# Patient Record
Sex: Female | Born: 1960 | Race: White | Hispanic: No | Marital: Married | Smoking: Former smoker
Health system: Southern US, Community
[De-identification: ages and names within clinical notes are randomized; demographics above are authoritative.]

## PROBLEM LIST (undated history)

## (undated) DIAGNOSIS — C801 Malignant (primary) neoplasm, unspecified: Secondary | ICD-10-CM

## (undated) DIAGNOSIS — F410 Panic disorder [episodic paroxysmal anxiety] without agoraphobia: Secondary | ICD-10-CM

## (undated) DIAGNOSIS — K219 Gastro-esophageal reflux disease without esophagitis: Secondary | ICD-10-CM

## (undated) DIAGNOSIS — R569 Unspecified convulsions: Secondary | ICD-10-CM

## (undated) DIAGNOSIS — F32A Depression, unspecified: Secondary | ICD-10-CM

## (undated) DIAGNOSIS — F329 Major depressive disorder, single episode, unspecified: Secondary | ICD-10-CM

## (undated) DIAGNOSIS — I82409 Acute embolism and thrombosis of unspecified deep veins of unspecified lower extremity: Secondary | ICD-10-CM

## (undated) HISTORY — PX: ABDOMINAL HYSTERECTOMY: SHX81

## (undated) HISTORY — DX: Major depressive disorder, single episode, unspecified: F32.9

## (undated) HISTORY — DX: Depression, unspecified: F32.A

## (undated) HISTORY — DX: Unspecified convulsions: R56.9

## (undated) HISTORY — DX: Gastro-esophageal reflux disease without esophagitis: K21.9

---

## 2002-02-14 ENCOUNTER — Other Ambulatory Visit: Admission: RE | Admit: 2002-02-14 | Discharge: 2002-02-14 | Payer: Self-pay | Admitting: Obstetrics and Gynecology

## 2002-02-21 ENCOUNTER — Ambulatory Visit (HOSPITAL_COMMUNITY): Admission: RE | Admit: 2002-02-21 | Discharge: 2002-02-21 | Payer: Self-pay | Admitting: Obstetrics and Gynecology

## 2002-02-21 ENCOUNTER — Encounter: Payer: Self-pay | Admitting: Obstetrics and Gynecology

## 2003-04-06 ENCOUNTER — Emergency Department (HOSPITAL_COMMUNITY): Admission: EM | Admit: 2003-04-06 | Discharge: 2003-04-07 | Payer: Self-pay | Admitting: Emergency Medicine

## 2003-06-30 ENCOUNTER — Other Ambulatory Visit: Payer: Self-pay

## 2003-12-10 ENCOUNTER — Other Ambulatory Visit: Payer: Self-pay

## 2004-02-07 ENCOUNTER — Emergency Department (HOSPITAL_COMMUNITY): Admission: EM | Admit: 2004-02-07 | Discharge: 2004-02-07 | Payer: Self-pay | Admitting: Emergency Medicine

## 2004-02-28 ENCOUNTER — Emergency Department: Payer: Self-pay | Admitting: Internal Medicine

## 2004-02-28 ENCOUNTER — Other Ambulatory Visit: Payer: Self-pay

## 2004-02-29 ENCOUNTER — Ambulatory Visit: Payer: Self-pay | Admitting: Internal Medicine

## 2004-08-15 ENCOUNTER — Emergency Department: Payer: Self-pay | Admitting: Emergency Medicine

## 2005-02-06 ENCOUNTER — Emergency Department: Payer: Self-pay | Admitting: Emergency Medicine

## 2005-04-10 ENCOUNTER — Ambulatory Visit (HOSPITAL_COMMUNITY): Payer: Self-pay | Admitting: Psychiatry

## 2005-05-03 ENCOUNTER — Ambulatory Visit (HOSPITAL_COMMUNITY): Payer: Self-pay | Admitting: Psychiatry

## 2005-05-29 ENCOUNTER — Ambulatory Visit (HOSPITAL_COMMUNITY): Payer: Self-pay | Admitting: Psychiatry

## 2005-07-24 ENCOUNTER — Ambulatory Visit (HOSPITAL_COMMUNITY): Payer: Self-pay | Admitting: Psychiatry

## 2005-10-06 ENCOUNTER — Ambulatory Visit (HOSPITAL_COMMUNITY): Payer: Self-pay | Admitting: Psychiatry

## 2005-12-04 ENCOUNTER — Ambulatory Visit (HOSPITAL_COMMUNITY): Payer: Self-pay | Admitting: Psychiatry

## 2006-02-05 ENCOUNTER — Ambulatory Visit (HOSPITAL_COMMUNITY): Payer: Self-pay | Admitting: Psychiatry

## 2006-03-09 ENCOUNTER — Ambulatory Visit: Payer: Self-pay | Admitting: Unknown Physician Specialty

## 2006-05-04 ENCOUNTER — Ambulatory Visit (HOSPITAL_COMMUNITY): Payer: Self-pay | Admitting: Psychiatry

## 2006-08-03 ENCOUNTER — Ambulatory Visit (HOSPITAL_COMMUNITY): Payer: Self-pay | Admitting: Psychiatry

## 2006-12-21 ENCOUNTER — Ambulatory Visit (HOSPITAL_COMMUNITY): Payer: Self-pay | Admitting: Psychiatry

## 2007-03-27 ENCOUNTER — Ambulatory Visit (HOSPITAL_COMMUNITY): Payer: Self-pay | Admitting: Psychiatry

## 2007-07-03 ENCOUNTER — Ambulatory Visit (HOSPITAL_COMMUNITY): Payer: Self-pay | Admitting: Psychiatry

## 2007-10-11 ENCOUNTER — Ambulatory Visit (HOSPITAL_COMMUNITY): Payer: Self-pay | Admitting: Psychiatry

## 2008-01-10 ENCOUNTER — Ambulatory Visit (HOSPITAL_COMMUNITY): Payer: Self-pay | Admitting: Psychiatry

## 2008-04-03 ENCOUNTER — Ambulatory Visit (HOSPITAL_COMMUNITY): Payer: Self-pay | Admitting: Psychiatry

## 2008-07-01 ENCOUNTER — Ambulatory Visit (HOSPITAL_COMMUNITY): Payer: Self-pay | Admitting: Psychiatry

## 2008-10-14 ENCOUNTER — Ambulatory Visit (HOSPITAL_COMMUNITY): Payer: Self-pay | Admitting: Psychiatry

## 2009-01-04 ENCOUNTER — Ambulatory Visit (HOSPITAL_COMMUNITY): Payer: Self-pay | Admitting: Psychiatry

## 2009-02-17 ENCOUNTER — Ambulatory Visit: Payer: Self-pay | Admitting: Unknown Physician Specialty

## 2009-03-31 ENCOUNTER — Ambulatory Visit (HOSPITAL_COMMUNITY): Payer: Self-pay | Admitting: Psychiatry

## 2009-06-28 ENCOUNTER — Ambulatory Visit (HOSPITAL_COMMUNITY): Payer: Self-pay | Admitting: Psychiatry

## 2009-08-06 ENCOUNTER — Emergency Department (HOSPITAL_COMMUNITY): Admission: EM | Admit: 2009-08-06 | Discharge: 2009-08-06 | Payer: Self-pay | Admitting: Emergency Medicine

## 2009-08-11 ENCOUNTER — Ambulatory Visit (HOSPITAL_COMMUNITY): Payer: Self-pay | Admitting: Psychiatry

## 2009-09-01 ENCOUNTER — Ambulatory Visit (HOSPITAL_COMMUNITY): Payer: Self-pay | Admitting: Psychiatry

## 2009-09-20 ENCOUNTER — Ambulatory Visit (HOSPITAL_COMMUNITY): Payer: Self-pay | Admitting: Psychiatry

## 2009-11-19 ENCOUNTER — Ambulatory Visit (HOSPITAL_COMMUNITY): Payer: Self-pay | Admitting: Psychiatry

## 2010-01-21 ENCOUNTER — Ambulatory Visit (HOSPITAL_COMMUNITY): Payer: Self-pay | Admitting: Psychiatry

## 2010-02-04 ENCOUNTER — Other Ambulatory Visit: Payer: Self-pay

## 2010-03-11 ENCOUNTER — Ambulatory Visit (HOSPITAL_COMMUNITY): Payer: Self-pay | Admitting: Psychiatry

## 2010-05-27 ENCOUNTER — Ambulatory Visit (HOSPITAL_COMMUNITY)
Admission: RE | Admit: 2010-05-27 | Discharge: 2010-05-27 | Payer: Self-pay | Source: Home / Self Care | Attending: Psychiatry | Admitting: Psychiatry

## 2010-08-19 ENCOUNTER — Encounter (HOSPITAL_COMMUNITY): Payer: Self-pay | Admitting: Psychiatry

## 2010-09-02 ENCOUNTER — Encounter (INDEPENDENT_AMBULATORY_CARE_PROVIDER_SITE_OTHER): Payer: PRIVATE HEALTH INSURANCE | Admitting: Psychiatry

## 2010-09-02 DIAGNOSIS — F411 Generalized anxiety disorder: Secondary | ICD-10-CM

## 2010-12-02 ENCOUNTER — Encounter (INDEPENDENT_AMBULATORY_CARE_PROVIDER_SITE_OTHER): Payer: PRIVATE HEALTH INSURANCE | Admitting: Psychiatry

## 2010-12-02 DIAGNOSIS — F411 Generalized anxiety disorder: Secondary | ICD-10-CM

## 2011-03-10 ENCOUNTER — Encounter (HOSPITAL_COMMUNITY): Payer: PRIVATE HEALTH INSURANCE | Admitting: Psychiatry

## 2011-03-20 ENCOUNTER — Encounter (INDEPENDENT_AMBULATORY_CARE_PROVIDER_SITE_OTHER): Payer: PRIVATE HEALTH INSURANCE | Admitting: Psychiatry

## 2011-03-20 DIAGNOSIS — F329 Major depressive disorder, single episode, unspecified: Secondary | ICD-10-CM

## 2011-04-21 DIAGNOSIS — N318 Other neuromuscular dysfunction of bladder: Secondary | ICD-10-CM | POA: Insufficient documentation

## 2011-06-16 ENCOUNTER — Ambulatory Visit (INDEPENDENT_AMBULATORY_CARE_PROVIDER_SITE_OTHER): Payer: PRIVATE HEALTH INSURANCE | Admitting: Psychiatry

## 2011-06-16 DIAGNOSIS — F329 Major depressive disorder, single episode, unspecified: Secondary | ICD-10-CM

## 2011-06-16 MED ORDER — PAROXETINE HCL 40 MG PO TABS: 40.0000 mg | ORAL_TABLET | ORAL | Status: DC

## 2011-06-16 NOTE — Progress Notes (Signed)
Patient came today for her followup appointment. She has been compliant with her Paxil. She had a good Christmas and holidays. She reported no side effects of medication. She denies any depressive thoughts or anxiety. Her job is also going very well. She denies any crying spells agitation or mood swings. She needs refill on her medication.  Mental status examination Patient is pleasant calm and cooperative. She maintained good eye contact. Her speech is soft clear and coherent. She described her mood is good and her affect is mood congruent. Her attention and concentration is fair. She denies any active or passive suicidal thoughts or homicidal thoughts. There no psychotic symptoms present. She's alert and oriented x3 and her insight judgment impulse control is okay  Assessment Depressive disorder NOS  Plan I will continue her Paxil 40 mg daily. I explained risks and benefits of medication in detail. I will see her again in 3 months

## 2011-09-15 ENCOUNTER — Ambulatory Visit (INDEPENDENT_AMBULATORY_CARE_PROVIDER_SITE_OTHER): Payer: PRIVATE HEALTH INSURANCE | Admitting: Psychiatry

## 2011-09-15 ENCOUNTER — Encounter (HOSPITAL_COMMUNITY): Payer: Self-pay | Admitting: Psychiatry

## 2011-09-15 VITALS — BP 110/68 | HR 68 | Ht 64.0 in | Wt 179.4 lb

## 2011-09-15 DIAGNOSIS — F329 Major depressive disorder, single episode, unspecified: Secondary | ICD-10-CM

## 2011-09-15 MED ORDER — PAROXETINE HCL 40 MG PO TABS: 40.0000 mg | ORAL_TABLET | ORAL | Status: DC

## 2011-09-15 NOTE — Progress Notes (Signed)
Chief complaint Medication management and followup.  History of present illness Patient is 51 year old married employed female who came for her followup appointment.  She's been compliant with her psychiatric medication and reported no side effects.  She sleeping fine and denies any recent panic attack or nervousness.  She denies any recent crying spells.  She's able to see her grandchildren and had a good time.  Her job is going very well and she likes her job.    Current psychiatric medication Paxil 40 mg daily   Medical history Patient has history of acid reflux .  Recently she has seen her primary care physician Dr. Yetta [facility].  She reported her blood work was within normal limits.    Mental status emanation Patient is casually dressed and fairly groomed.   She is calm cooperative and pleasant.  She maintained good eye contact.  Her speech is fluent clear and coherent.  Her thought process logical linear and goal-directed.  There were no flight of idea or looseness cessation.  Her attention and concentration is fair.  She hasn't auditory or visual hallucination.  She denies any active or passive suicidal thinking and homicidal thinking.  There were no psychotic symptoms present .  She's alert and oriented x3.  Her insight judgment and pulse control is okay.  Assessment Axis I depressive disorder NOS Axis II deferred Axis III acid reflux Axis IV mild to moderate Axis V 65-75  Plan At this time patient is fairly stable on her Paxil.  I will continue her Paxil 40 mg daily. I explained risks and benefits of medication in detail. I will see her again in 3 months.  I recommend to have her blood test results faxed to [place].

## 2011-12-15 ENCOUNTER — Ambulatory Visit (INDEPENDENT_AMBULATORY_CARE_PROVIDER_SITE_OTHER): Payer: PRIVATE HEALTH INSURANCE | Admitting: Psychiatry

## 2011-12-15 ENCOUNTER — Encounter (HOSPITAL_COMMUNITY): Payer: Self-pay | Admitting: Psychiatry

## 2011-12-15 DIAGNOSIS — F329 Major depressive disorder, single episode, unspecified: Secondary | ICD-10-CM

## 2011-12-15 DIAGNOSIS — F3289 Other specified depressive episodes: Secondary | ICD-10-CM

## 2011-12-15 MED ORDER — PAROXETINE HCL 40 MG PO TABS: 40.0000 mg | ORAL_TABLET | ORAL | Status: DC

## 2011-12-15 NOTE — Progress Notes (Signed)
Chief complaint Medication management and followup.  History of present illness Patient is 51 year old married employed female who came for her followup appointment.  She has been seen more anxious and depressed in recent weeks.  Her son and daughter-in-law moved back to [place] .  She is missing the grandkids.  She continues to have issue with her daughter who is not very supportive and helpful.  Patient has noticed since her grandkids moved to [place] she is more sad tearful and anxious however she denies any active or passive suicidal thoughts.  She does not want to change her medication or at any new medication.  I recommend counseling however patient told that she will weight for another month and if she continued to have anxiety symptoms she will consider to see someone in the office.  Patient denies any agitation anger mood swing.  She denies any side effects of medication.  She's happy that her job is going very well.  She's not drinking or using any illegal substance.  Current psychiatric medication Paxil 40 mg daily   Medical history Patient has history of acid reflux .  Recently she has seen her primary care physician Dr. Yet[facility].  She reported her blood work was within normal limits.    Mental status emanation Patient is casually dressed and fairly groomed.   She is anxious but cooperative.  She described her mood is sad and her affect is constricted.  She denies any active or passive suicidal thoughts or homicidal thoughts.  Her speech is slow but clear and coherent.  Her thought process is goal-directed.  She denies any auditory or visual hallucination.  There were no psychotic symptoms present at this time.  She's alert and oriented x3.  Her insight judgment and pulse control is okay.   Assessment Axis I depressive disorder NOS Axis II deferred Axis III acid reflux Axis IV mild to moderate Axis V 65-75  Plan I recommend counseling ,  medication adjustment however patient declined at this time.  Patient had good support at home and through her job.  I recommend to see me in 6 weeks and if her symptoms continue to persist then patient agreed to consider counseling.  I recommend to call us if she is any question or concern about the medication or if she feels worsening of the symptoms.  I will see her again in 6 weeks.  We will continue Paxil at present does.

## 2012-01-26 ENCOUNTER — Ambulatory Visit (HOSPITAL_COMMUNITY): Payer: Self-pay | Admitting: Psychiatry

## 2012-02-02 ENCOUNTER — Ambulatory Visit (INDEPENDENT_AMBULATORY_CARE_PROVIDER_SITE_OTHER): Payer: PRIVATE HEALTH INSURANCE | Admitting: Psychiatry

## 2012-02-02 ENCOUNTER — Encounter (HOSPITAL_COMMUNITY): Payer: Self-pay | Admitting: Psychiatry

## 2012-02-02 VITALS — Wt 181.0 lb

## 2012-02-02 DIAGNOSIS — F329 Major depressive disorder, single episode, unspecified: Secondary | ICD-10-CM

## 2012-02-02 MED ORDER — PAROXETINE HCL 40 MG PO TABS: 40.0000 mg | ORAL_TABLET | ORAL | Status: DC

## 2012-02-02 NOTE — Progress Notes (Signed)
Chief complaint I am doing better.    History of present illness Patient is 51-year-old married employed female who came for her followup appointment.  On her last visit she was very anxious and depressed as her son move to [place].  We have offer counseling but patient felt that she does not need counseling at this time.  Patient is trying to keep herself busy.  Recently she find out that his son who lives in [place] having a baby.  Patient is excited.  She wants to quit smoking .  She is trying to walk every day.  She denies any recent panic attack crying spells or any insomnia.  She wants to continue her current medication which is working very well.  She denies any agitation or any hopeless feeling.  Her energy level is good.  She's not drinking or using any illegal substance.   She [facility] for complete checkup and blood work.    Current psychiatric medication Paxil 40 mg daily   Medical history Patient has history of acid reflux .  Recently she has seen her primary care physician Dr. Yet[facility] at [facility].  She reported her blood work was within normal limits.    Psychosocial history Patient lives with her husband and her father.  Her father has been living since 2011.  Patient has 5 children.  She had 4 children that she raised.  She has given up one daughter for adoption.  Patient has limited contact with her other daughter.  She is very close to her son.  She has one son who lives in refill other in [place] and third one recently moved to [place].  Mental status emanation Patient is casually dressed and fairly groomed.   She is cooperative and maintained fair eye contact.  She described her mood is better from the past and her affect is improved.  Her speech is clear and coherent.  Her thought process logical linear and goal-directed.  She denies any auditory or visual hallucination.  She denies any active or passive  suicidal thoughts or homicidal thoughts.  There were no flight of ideas or loose association.  She's alert and oriented x3.  Her insight judgment and impulse control is okay.   Assessment Axis I depressive disorder NOS Axis II deferred Axis III acid reflux Axis IV mild to moderate Axis V 65-75  Plan I will continue her current psychiatric medication.  I recommend to have his blood test faxed to [place] when she see her primary care physician.   I explained risks and benefits of medication and recommend to call us if she is any question or concern about the medication if she feels worsening of the symptom.  I will see her again in 2 months.

## 2012-03-03 ENCOUNTER — Emergency Department (HOSPITAL_COMMUNITY): Payer: PRIVATE HEALTH INSURANCE

## 2012-03-03 ENCOUNTER — Encounter (HOSPITAL_COMMUNITY): Payer: Self-pay | Admitting: Adult Health

## 2012-03-03 ENCOUNTER — Emergency Department (HOSPITAL_COMMUNITY)
Admission: EM | Admit: 2012-03-03 | Discharge: 2012-03-03 | Disposition: A | Discharge status: Home / Self Care | Payer: PRIVATE HEALTH INSURANCE | Attending: Emergency Medicine | Admitting: Emergency Medicine

## 2012-03-03 DIAGNOSIS — Z90710 Acquired absence of both cervix and uterus: Secondary | ICD-10-CM | POA: Insufficient documentation

## 2012-03-03 DIAGNOSIS — R1011 Right upper quadrant pain: Secondary | ICD-10-CM | POA: Insufficient documentation

## 2012-03-03 DIAGNOSIS — R10819 Abdominal tenderness, unspecified site: Secondary | ICD-10-CM | POA: Insufficient documentation

## 2012-03-03 DIAGNOSIS — K8020 Calculus of gallbladder without cholecystitis without obstruction: Secondary | ICD-10-CM | POA: Insufficient documentation

## 2012-03-03 LAB — COMPREHENSIVE METABOLIC PANEL
BUN: 9 mg/dL (ref 6–23)
CO2: 25 mEq/L (ref 19–32)
Calcium: 9.6 mg/dL (ref 8.4–10.5)
Chloride: 103 mEq/L (ref 96–112)
Creatinine, Ser: 0.64 mg/dL (ref 0.50–1.10)
GFR calc Af Amer: >90 mL/min (ref >90)
GFR calc non Af Amer: >90 mL/min (ref >90)
Total Bilirubin: 0.2 mg/dL — ABNORMAL LOW (ref 0.3–1.2)

## 2012-03-03 LAB — LIPASE, BLOOD: Lipase: 35 U/L (ref 11–59)

## 2012-03-03 LAB — CBC WITH DIFFERENTIAL/PLATELET
Eosinophils Relative: 1 % (ref 0–5)
HCT: 38.4 % (ref 36.0–46.0)
Hemoglobin: 13 g/dL (ref 12.0–15.0)
Lymphocytes Relative: 25 % (ref 12–46)
MCHC: 33.9 g/dL (ref 30.0–36.0)
MCV: 88.9 fL (ref 78.0–100.0)
Monocytes Absolute: 0.4 10*3/uL (ref 0.1–1.0)
Monocytes Relative: 5 % (ref 3–12)
Neutro Abs: 5.5 10*3/uL (ref 1.7–7.7)
RDW: 12.9 % (ref 11.5–15.5)
WBC: 8.1 10*3/uL (ref 4.0–10.5)

## 2012-03-03 MED ORDER — FENTANYL CITRATE 0.05 MG/ML IJ SOLN
50.0000 ug | Freq: Once | INTRAMUSCULAR | Status: AC
  Administered 2012-03-03: 50 ug via INTRAVENOUS
  Filled 2012-03-03: qty 2

## 2012-03-03 MED ORDER — SODIUM CHLORIDE 0.9 % IV BOLUS (SEPSIS)
1000.0000 mL | Freq: Once | INTRAVENOUS | Status: AC
  Administered 2012-03-03: 1000 mL via INTRAVENOUS

## 2012-03-03 MED ORDER — OXYCODONE-ACETAMINOPHEN 5-325 MG PO TABS: 2.0000 | ORAL_TABLET | ORAL | Status: DC | PRN

## 2012-03-03 MED ORDER — ONDANSETRON HCL 4 MG/2ML IJ SOLN
4.0000 mg | Freq: Once | INTRAMUSCULAR | Status: AC
  Administered 2012-03-03: 4 mg via INTRAVENOUS
  Filled 2012-03-03: qty 2

## 2012-03-03 MED ORDER — ONDANSETRON HCL 4 MG PO TABS: 4.0000 mg | ORAL_TABLET | Freq: Four times a day (QID) | ORAL | Status: DC

## 2012-03-03 NOTE — ED Notes (Signed)
Reports upper abdominal pain that began at 1030 this evening associated with nausea. Pain is described as constant and "hurting very bad" attempted to take pepto with no relief. Denies SOB and chest pain.

## 2012-03-03 NOTE — ED Provider Notes (Addendum)
History     CSN: [id number]  Arrival date & time 03/03/12  0209   First MD Initiated Contact with Patient 03/03/12 0239      Chief Complaint  Patient presents with  . Abdominal Pain    (Consider location/radiation/quality/duration/timing/severity/associated sxs/prior treatment) HPI 51 year old female presents to emergency room complaining of upper abdominal pain starting around 10 PM tonight. Patient has had nausea associated with the pain. She has taken Pepto-Bismol x2 without improvement in symptoms. Patient has history of GERD, takes Nexium each day. Pain is much worse in her normal reflux symptoms. She denies any radiation into her back or shoulders. No chest pain, no shortness of breath. She has had normal bowel movements. Patient is status post hysterectomy, no complications since that time. No fevers no chills. No prior history of gallbladder disease. No sick contacts.  Past Medical History  Diagnosis Date  . GERD (gastroesophageal reflux disease)     Past Surgical History  Procedure Date  . Abdominal hysterectomy     Family History  Problem Relation Age of Onset  . Depression Sister     History  Substance Use Topics  . Smoking status: Current Every Day Smoker -- 0.5 packs/day for 30 years    Types: Cigarettes  . Smokeless tobacco: Not on file  . Alcohol Use: No    OB History    Grav Para Term Preterm Abortions TAB SAB Ect Mult Living                  Review of Systems  All other systems reviewed and are negative.    Allergies  Penicillins  Home Medications   Current Outpatient Rx  Name Route Sig Dispense Refill  . ESOMEPRAZOLE MAGNESIUM 40 MG PO CPDR Oral Take 40 mg by mouth daily before breakfast.    . PAROXETINE HCL 40 MG PO TABS Oral Take 1 tablet (40 mg total) by mouth every morning. 30 tablet 1    BP 132/82  Pulse 69  Temp 97.6 F (36.4 C) (Oral)  Resp 16  SpO2 100%  Physical Exam  Nursing note and vitals reviewed. Constitutional:  She is oriented to person, place, and time. She appears well-developed and well-nourished.  HENT:  Head: Normocephalic and atraumatic.  Nose: Nose normal.       Dry mucous membrane  Eyes: Conjunctivae normal and EOM are normal. Pupils are equal, round, and reactive to light.  Neck: Normal range of motion. Neck supple. No JVD present. No tracheal deviation present. No thyromegaly present.  Cardiovascular: Normal rate, regular rhythm, normal heart sounds and intact distal pulses.  Exam reveals no gallop and no friction rub.   No murmur heard. Pulmonary/Chest: Effort normal and breath sounds normal. No stridor. No respiratory distress. She has no wheezes. She has no rales. She exhibits no tenderness.  Abdominal: Soft. Bowel sounds are normal. She exhibits no distension and no mass. There is tenderness (Patient with significant tenderness in upper abdomen, mostly in epigastrium and right upper quadrant with positive Murphy's sign.). There is no rebound and no guarding.  Musculoskeletal: Normal range of motion. She exhibits no edema and no tenderness.  Lymphadenopathy:    She has no cervical adenopathy.  Neurological: She is alert and oriented to person, place, and time. She exhibits normal muscle tone. Coordination normal.  Skin: Skin is dry. No rash noted. No erythema. No pallor.  Psychiatric: She has a normal mood and affect. Her behavior is normal. Judgment and thought content normal.  ED Course  Procedures (including critical care time)  Labs Reviewed  COMPREHENSIVE METABOLIC PANEL - Abnormal; Notable for the following:    Glucose, Bld 123 (*)     Total Bilirubin 0.2 (*)     All other components within normal limits  CBC WITH DIFFERENTIAL  LIPASE, BLOOD   US Abdomen Complete  03/03/2012  *RADIOLOGY REPORT*  Clinical Data:  Right upper quadrant pain  COMPLETE ABDOMINAL ULTRASOUND  Comparison:  None.  Findings:  Gallbladder:  2.6 cm gallstone.  Additional 5 mm probable gallstone.  No  gallbladder wall thickening or pericholecystic fluid.  Negative sonographic Murphy's sign.  Common bile duct:  Measures 4 mm.  Liver:  No focal lesion identified.  Within normal limits in parenchymal echogenicity.  IVC:  Appears normal.  Pancreas:  Incompletely visualized but grossly unremarkable.  Spleen:  Measures 4.3 cm.  Right Kidney:  Measures 13.0 cm.  No mass or hydronephrosis.  Left Kidney:  Measures 12.2 cm.  No mass or hydronephrosis.  Abdominal aorta:  No aneurysm identified.  IMPRESSION: Cholelithiasis, without associated findings to suggest acute cholecystitis.   Original Report Authenticated By: Charline Bills, M.D.     Date: 03/03/2012  Rate: 67  Rhythm: normal sinus rhythm  QRS Axis: normal  Intervals: normal  ST/T Wave abnormalities: normal  Conduction Disutrbances:none  Narrative Interpretation:   Old EKG Reviewed: unchanged    1. Cholelithiasis       MDM  51 year old female with acute upper abdominal pain. Concern for possible cholelithiasis. Will treat pain, check labs, get ultrasound. Differential also includes pancreatitis, peptic ulcer disease, or gastritis.  5:48 AM D/w Dr Janee Morn on call for surgery.  He agrees that since pt is pain free without lab abn or signs of cholecystitis, is stable for f/u outpatient clinic.        Olivia Mackie, MD 03/03/12 [id number]  Olivia Mackie, MD 03/03/12 [phone]

## 2012-03-25 DIAGNOSIS — E785 Hyperlipidemia, unspecified: Secondary | ICD-10-CM | POA: Insufficient documentation

## 2012-03-27 DIAGNOSIS — K8020 Calculus of gallbladder without cholecystitis without obstruction: Secondary | ICD-10-CM | POA: Insufficient documentation

## 2012-04-05 ENCOUNTER — Ambulatory Visit (INDEPENDENT_AMBULATORY_CARE_PROVIDER_SITE_OTHER): Payer: PRIVATE HEALTH INSURANCE | Admitting: Psychiatry

## 2012-04-05 ENCOUNTER — Encounter (HOSPITAL_COMMUNITY): Payer: Self-pay | Admitting: Psychiatry

## 2012-04-05 VITALS — BP 105/63 | HR 63 | Wt 172.0 lb

## 2012-04-05 DIAGNOSIS — F3289 Other specified depressive episodes: Secondary | ICD-10-CM

## 2012-04-05 DIAGNOSIS — K2270 Barrett's esophagus without dysplasia: Secondary | ICD-10-CM | POA: Insufficient documentation

## 2012-04-05 DIAGNOSIS — F329 Major depressive disorder, single episode, unspecified: Secondary | ICD-10-CM

## 2012-04-05 DIAGNOSIS — K8020 Calculus of gallbladder without cholecystitis without obstruction: Secondary | ICD-10-CM

## 2012-04-05 MED ORDER — PAROXETINE HCL 40 MG PO TABS: 40.0000 mg | ORAL_TABLET | ORAL | Status: DC
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
## 2012-05-07 ENCOUNTER — Ambulatory Visit: Payer: Self-pay | Admitting: Unknown Physician Specialty

## 2012-07-05 ENCOUNTER — Ambulatory Visit (HOSPITAL_COMMUNITY): Payer: Self-pay | Admitting: Psychiatry

## 2012-07-22 ENCOUNTER — Other Ambulatory Visit (HOSPITAL_COMMUNITY): Payer: Self-pay | Admitting: Psychiatry

## 2012-07-22 DIAGNOSIS — F329 Major depressive disorder, single episode, unspecified: Secondary | ICD-10-CM

## 2012-07-22 MED ORDER — PAROXETINE HCL 40 MG PO TABS: 40.0000 mg | ORAL_TABLET | ORAL | Status: DC

## 2012-07-26 ENCOUNTER — Ambulatory Visit (HOSPITAL_COMMUNITY): Payer: Self-pay | Admitting: Psychiatry

## 2012-08-16 ENCOUNTER — Encounter (HOSPITAL_COMMUNITY): Payer: Self-pay | Admitting: Psychiatry

## 2012-08-16 ENCOUNTER — Ambulatory Visit (INDEPENDENT_AMBULATORY_CARE_PROVIDER_SITE_OTHER): Payer: PRIVATE HEALTH INSURANCE | Admitting: Psychiatry

## 2012-08-16 VITALS — BP 116/73 | HR 77 | Wt 172.0 lb

## 2012-08-16 DIAGNOSIS — F3289 Other specified depressive episodes: Secondary | ICD-10-CM

## 2012-08-16 DIAGNOSIS — F329 Major depressive disorder, single episode, unspecified: Secondary | ICD-10-CM

## 2012-08-16 MED ORDER — PAROXETINE HCL 40 MG PO TABS: 40.0000 mg | ORAL_TABLET | ORAL | Status: DC
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
## 2012-11-15 ENCOUNTER — Ambulatory Visit (INDEPENDENT_AMBULATORY_CARE_PROVIDER_SITE_OTHER): Payer: 59 | Admitting: Psychiatry

## 2012-11-15 ENCOUNTER — Encounter (HOSPITAL_COMMUNITY): Payer: Self-pay | Admitting: Psychiatry

## 2012-11-15 VITALS — Wt 176.0 lb

## 2012-11-15 DIAGNOSIS — F3289 Other specified depressive episodes: Secondary | ICD-10-CM

## 2012-11-15 DIAGNOSIS — F329 Major depressive disorder, single episode, unspecified: Secondary | ICD-10-CM

## 2012-11-15 MED ORDER — PAROXETINE HCL 40 MG PO TABS: 40.0000 mg | ORAL_TABLET | ORAL | Status: DC

## 2012-11-15 NOTE — Progress Notes (Signed)
[facility] [phone] Progress Note  Melissa Case [id number] 52 y.o.  11/15/2012 11:40 AM  Chief Complaint: Medication management and followup.  History of Present Illness: Patient is 52 year old married employed female who came for her followup appointment.  She's compliant with her psychiatric medication.  She reported no side effects.  She admitted some time feeling stress for no reason.  She sleeping better.  She is busy at work.  There has been no issues from her family .  She's excited about upcoming visit for fishing with her husband.  Patient denies any recent crying spells, irritability, agitation or any mood swing.  Her energy level is good.  She sleeping better.  She's not drinking or using any illegal substance.  She has no tremors or shakes.    Suicidal Ideation: No Plan Formed: No Patient has means to carry out plan: No  Homicidal Ideation: No Plan Formed: No Patient has means to carry out plan: No  Review of Systems: Psychiatric: Agitation: No Hallucination: No Depressed Mood: No Insomnia: No Hypersomnia: No Altered Concentration: No Feels Worthless: No Grandiose Ideas: No Belief In Special Powers: No New/Increased Substance Abuse: No Compulsions: No  Neurologic: Headache: No Seizure: No Paresthesias: No  Medical History: Patient has a history of acid reflux and barrett esophagus .  She also diagnosed with cholelithiasis .  She was recommended to have gallbladder surgery but she still waiting.  She sees [name] for her physical needs.    Psychosocial history. Patient lives with her husband and her father.  She has 5 children.  She has given up one daughter for adoption.  She has limited contact with her daughter.  She is very close to her son however most of her children are out of town.  She works and like her job.  Alcohol and substance use history.  Patient denies any history of alcohol or any illegal substance use.  Outpatient Encounter  Prescriptions as of 11/15/2012  Medication Sig Dispense Refill  . esomeprazole (NEXIUM) 40 MG capsule Take 40 mg by mouth daily before breakfast.      . PARoxetine (PAXIL) 40 MG tablet Take 1 tablet (40 mg total) by mouth every morning.  30 tablet  2  . [DISCONTINUED] PARoxetine (PAXIL) 40 MG tablet Take 1 tablet (40 mg total) by mouth every morning.  30 tablet  2  . clindamycin (CLEOCIN) 150 MG capsule       . [DISCONTINUED] ondansetron (ZOFRAN) 4 MG tablet Take 1 tablet (4 mg total) by mouth every 6 (six) hours. As needed for nausea  12 tablet  0  . [DISCONTINUED] oxyCODONE-acetaminophen (PERCOCET/ROXICET) 5-325 MG per tablet Take 2 tablets by mouth every 4 (four) hours as needed for pain.  20 tablet  0   No facility-administered encounter medications on file as of 11/15/2012.    Past Psychiatric History/Hospitalization(s): Anxiety: Yes Bipolar Disorder: No Depression: Yes Mania: No Psychosis: No Schizophrenia: No Personality Disorder: No Hospitalization for psychiatric illness: Yes History of Electroconvulsive Shock Therapy: No Prior Suicide Attempts: No  Physical Exam: Constitutional:  Wt 176 lb (79.833 kg)  BMI 30.2 kg/m2  General Appearance: alert, oriented, no acute distress and well nourished  Musculoskeletal: Strength & Muscle Tone: within normal limits Gait & Station: normal Patient leans: N/A  Psychiatric: Speech (describe rate, volume, coherence, spontaneity, and abnormalities if any): Clear and coherent  Thought Process (describe rate, content, abstract reasoning, and computation): Logical and goal-directed  Associations: Coherent and Intact  Thoughts: normal  Mental Status: Orientation: oriented to person, place, time/date and situation Mood & Affect: anxiety Attention Span & Concentration: Fair  Medical Decision Making (Choose Three): Established Problem, Stable/Improving (1), Review of Psycho-Social Stressors (1), Review of Last Therapy Session (1) and  Review of Medication Regimen & Side Effects (2)  Assessment: Axis I: Depressive disorder NOS  Axis II: Deferred  Axis III:  Patient Active Problem List   Diagnosis Date Noted  . 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 Gall stone 04/05/2012  . Depression 09/15/2011   Axis IV: Mild  Axis V: 55-75   Plan:  I will continue Paxil 40 mg daily.  This can benefit explain.  Recommend to call us back if she is any question concerns or should be worsening of the symptom.  I will see her again in 3 months.  Logyn Dedominicis T., MD 11/15/2012

## 2013-02-14 ENCOUNTER — Ambulatory Visit (HOSPITAL_COMMUNITY): Payer: 59 | Admitting: Psychiatry

## 2013-02-19 ENCOUNTER — Ambulatory Visit (HOSPITAL_COMMUNITY): Payer: Self-pay | Admitting: Psychiatry

## 2013-03-14 ENCOUNTER — Encounter (HOSPITAL_COMMUNITY): Payer: Self-pay | Admitting: Psychiatry

## 2013-03-14 ENCOUNTER — Ambulatory Visit (INDEPENDENT_AMBULATORY_CARE_PROVIDER_SITE_OTHER): Payer: 59 | Admitting: Psychiatry

## 2013-03-14 VITALS — BP 102/64 | HR 65 | Wt 178.0 lb

## 2013-03-14 DIAGNOSIS — F329 Major depressive disorder, single episode, unspecified: Secondary | ICD-10-CM

## 2013-03-14 DIAGNOSIS — Z79899 Other long term (current) drug therapy: Secondary | ICD-10-CM

## 2013-03-14 MED ORDER — PAROXETINE HCL 40 MG PO TABS: 40.0000 mg | ORAL_TABLET | ORAL | Status: DC

## 2013-03-14 NOTE — Progress Notes (Signed)
[facility] [id number] Progress Note  Melissa Case [id number] 52 y.o.  03/14/2013 9:19 AM  Chief Complaint: Medication management and followup.  History of Present Illness: Patient came for her followup appointment.  She's compliant with her Paxil and Nexium.  She reported no side effects.  Her daughter has a baby in August when he was in [place] and she is very excited about her.  Patient has no plan at this time to visit her because she is very busy at work.  She is concerned about her dog who was diagnosed with cancer and getting chemotherapy.  Overall patient is doing better.  She denies any insomnia irritability anger and mood swings.  She denies any crying spells.  She has no tremors or shakes.  Her energy level is good.  She has not seen her primary care physician in a while.  She has no blood work since last year.  Patient is not drinking or using any illegal substance.  Suicidal Ideation: No Plan Formed: No Patient has means to carry out plan: No  Homicidal Ideation: No Plan Formed: No Patient has means to carry out plan: No  Review of Systems: Psychiatric: Agitation: No Hallucination: No Depressed Mood: No Insomnia: No Hypersomnia: No Altered Concentration: No Feels Worthless: No Grandiose Ideas: No Belief In Special Powers: No New/Increased Substance Abuse: No Compulsions: No  Neurologic: Headache: No Seizure: No Paresthesias: No  Medical History:  Patient has a history of acid reflux and barrett esophagus .  Her primary care physician is [name] and she see Dr. Markham Jordan at [facility] at [facility] for her GI issues.    Psychosocial history. Patient lives with her husband and her father.  She has 5 children.  She has given up one daughter for adoption.  She has limited contact with her daughter.  She is very close to her son however most of her children are out of town.  She works and like her job.  Alcohol and substance use history.   Patient denies any history of alcohol or any illegal substance use.  Outpatient Encounter Prescriptions as of 03/14/2013  Medication Sig Dispense Refill  . esomeprazole (NEXIUM) 40 MG capsule Take 40 mg by mouth daily before breakfast.      . PARoxetine (PAXIL) 40 MG tablet Take 1 tablet (40 mg total) by mouth every morning.  30 tablet  2  . [DISCONTINUED] PARoxetine (PAXIL) 40 MG tablet Take 1 tablet (40 mg total) by mouth every morning.  30 tablet  2  . [DISCONTINUED] clindamycin (CLEOCIN) 150 MG capsule        No facility-administered encounter medications on file as of 03/14/2013.    Past Psychiatric History/Hospitalization(s): Anxiety: Yes Bipolar Disorder: No Depression: Yes Mania: No Psychosis: No Schizophrenia: No Personality Disorder: No Hospitalization for psychiatric illness: Yes History of Electroconvulsive Shock Therapy: No Prior Suicide Attempts: No  Physical Exam: Constitutional:  BP 102/64  Pulse 65  Wt 178 lb (80.74 kg)  BMI 30.54 kg/m2  General Appearance: alert, oriented, no acute distress and well nourished  Musculoskeletal: Strength & Muscle Tone: within normal limits Gait & Station: normal Patient leans: N/A  Psychiatric: Speech (describe rate, volume, coherence, spontaneity, and abnormalities if any): Clear and coherent  Thought Process (describe rate, content, abstract reasoning, and computation): Logical and goal-directed  Associations: Coherent and Intact  Thoughts: normal  Mental Status: Orientation: oriented to person, place, time/date and situation Mood & Affect: anxiety Attention Span & Concentration: Fair  Medical  Decision Making (Choose Three): Established Problem, Stable/Improving (1), Review of Psycho-Social Stressors (1), Review of Last Therapy Session (1) and Review of Medication Regimen & Side Effects (2)  Assessment: Axis I: Depressive disorder NOS  Axis II: Deferred  Axis III:  Patient Active Problem List    Diagnosis Date Noted  . 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 Gall stone 04/05/2012  . Depression 09/15/2011   Axis IV: Mild  Axis V: 55-75   Plan:  I will continue Paxil 40 mg daily.  I will order CBC, CMP, TSH, lipid panel and hemoglobin A1c since patient has not had any blood work in more than a year.  I explained risks and benefits of medication. Recommend to call us back if she is any question concerns or should be worsening of the symptom.  I will see her again in 3 months.   Bud Kaeser T., MD 03/14/2013

## 2013-05-19 ENCOUNTER — Other Ambulatory Visit (HOSPITAL_COMMUNITY): Payer: Self-pay | Admitting: *Deleted

## 2013-05-19 DIAGNOSIS — F329 Major depressive disorder, single episode, unspecified: Secondary | ICD-10-CM

## 2013-05-20 MED ORDER — PAROXETINE HCL 40 MG PO TABS: 40.0000 mg | ORAL_TABLET | ORAL | Status: DC

## 2013-05-20 NOTE — Telephone Encounter (Signed)
90 day refill authorized by Dr. Lolly Mustache

## 2013-06-13 ENCOUNTER — Ambulatory Visit (INDEPENDENT_AMBULATORY_CARE_PROVIDER_SITE_OTHER): Payer: 59 | Admitting: Psychiatry

## 2013-06-13 ENCOUNTER — Encounter (HOSPITAL_COMMUNITY): Payer: Self-pay | Admitting: Psychiatry

## 2013-06-13 DIAGNOSIS — F3289 Other specified depressive episodes: Secondary | ICD-10-CM

## 2013-06-13 DIAGNOSIS — F329 Major depressive disorder, single episode, unspecified: Secondary | ICD-10-CM

## 2013-06-13 NOTE — Progress Notes (Signed)
[facility] [phone] Progress Note  Melissa Case [id number] 53 y.o.  06/13/2013 9:17 AM  Chief Complaint: Medication management and followup.  History of Present Illness: Melissa Case came for her followup appointment .  She is compliant with her Paxil .  She had a very good Christmas.  She enjoyed family members.  She is sleeping good.  She denies any recent crying spells, depressive thoughts or any anxiety attacks.  She likes her work.  Sometimes she gets concerned because her he has been recently bought by corporate .  Patient denies any irritability or any anger.  She wants to continue her Paxil.  She has no side effects.  She is not drinking or using any illegal substances.  Patient forgot to have blood work or promise to have blood work before her next visit.    Suicidal Ideation: No Plan Formed: No Patient has means to carry out plan: No  Homicidal Ideation: No Plan Formed: No Patient has means to carry out plan: No  Review of Systems: Psychiatric: Agitation: No Hallucination: No Depressed Mood: No Insomnia: No Hypersomnia: No Altered Concentration: No Feels Worthless: No Grandiose Ideas: No Belief In Special Powers: No New/Increased Substance Abuse: No Compulsions: No  Neurologic: Headache: No Seizure: No Paresthesias: No  Medical History:  Patient has a history of acid reflux and barrett esophagus .  Her primary care physician is Eldridge Abrahams and she see Dr. Tiffany Kocher at [facility] at [facility] for her GI issues.    Psychosocial history. Patient lives with her husband and her father.  She has 5 children.  She has given up one daughter for adoption.  She has limited contact with her daughter.  She is very close to her son however most of her children are out of town.  She works and like her job.   Outpatient Encounter Prescriptions as of 06/13/2013  Medication Sig  . esomeprazole (NEXIUM) 40 MG capsule Take 40 mg by mouth daily before  breakfast.  . PARoxetine (PAXIL) 40 MG tablet Take 1 tablet (40 mg total) by mouth every morning.    Past Psychiatric History/Hospitalization(s): Anxiety: Yes Bipolar Disorder: No Depression: Yes Mania: No Psychosis: No Schizophrenia: No Personality Disorder: No Hospitalization for psychiatric illness: Yes History of Electroconvulsive Shock Therapy: No Prior Suicide Attempts: No  Physical Exam: Constitutional:  There were no vitals taken for this visit.  General Appearance: alert, oriented, no acute distress and well nourished  Musculoskeletal: Strength & Muscle Tone: within normal limits Gait & Station: normal Patient leans: N/A  Psychiatric: Speech (describe rate, volume, coherence, spontaneity, and abnormalities if any): Clear and coherent  Thought Process (describe rate, content, abstract reasoning, and computation): Logical and goal-directed  Associations: Coherent and Intact  Thoughts: normal  Mental Status: Orientation: oriented to person, place, time/date and situation Mood & Affect: anxiety Attention Span & Concentration: Fair  Medical Decision Making (Choose Three): Established Problem, Stable/Improving (1), Review of Psycho-Social Stressors (1), Review of Last Therapy Session (1) and Review of Medication Regimen & Side Effects (2)  Assessment: Axis I: Depressive disorder NOS  Axis II: Deferred  Axis III:  Patient Active Problem List   Diagnosis Date Noted  . 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 Gall stone 04/05/2012  . Depression 09/15/2011   Axis IV: Mild  Axis V: 55-75   Plan:  I will continue Paxil 40 mg daily.  Patient recently received a 90 day supply of her Paxil.  I encouraged her to have her blood work  done before our next visit.  Discussed risks and benefits of medication.  Followup in 4 months.  ARFEEN,SYED T., MD 06/13/2013

## 2013-08-07 ENCOUNTER — Other Ambulatory Visit (HOSPITAL_COMMUNITY): Payer: Self-pay | Admitting: Psychiatry

## 2013-08-07 DIAGNOSIS — F329 Major depressive disorder, single episode, unspecified: Secondary | ICD-10-CM

## 2013-08-07 DIAGNOSIS — F3289 Other specified depressive episodes: Secondary | ICD-10-CM

## 2013-08-07 NOTE — Telephone Encounter (Signed)
Chart reviewed, refill appropriate. Has appointment May 2015.

## 2013-09-23 ENCOUNTER — Other Ambulatory Visit (HOSPITAL_COMMUNITY): Payer: Self-pay | Admitting: Psychiatry

## 2013-09-30 ENCOUNTER — Inpatient Hospital Stay (HOSPITAL_COMMUNITY)
Admission: EM | Admit: 2013-09-30 | Discharge: 2013-10-02 | DRG: 390 | Disposition: A | Discharge status: Home / Self Care | Payer: Managed Care, Other (non HMO) | Attending: Internal Medicine | Admitting: Internal Medicine

## 2013-09-30 ENCOUNTER — Encounter (HOSPITAL_COMMUNITY): Payer: Self-pay | Admitting: Emergency Medicine

## 2013-09-30 ENCOUNTER — Emergency Department (HOSPITAL_COMMUNITY): Payer: Managed Care, Other (non HMO)

## 2013-09-30 DIAGNOSIS — F17200 Nicotine dependence, unspecified, uncomplicated: Secondary | ICD-10-CM | POA: Diagnosis present

## 2013-09-30 DIAGNOSIS — R109 Unspecified abdominal pain: Secondary | ICD-10-CM | POA: Diagnosis present

## 2013-09-30 DIAGNOSIS — K2270 Barrett's esophagus without dysplasia: Secondary | ICD-10-CM | POA: Diagnosis present

## 2013-09-30 DIAGNOSIS — K8020 Calculus of gallbladder without cholecystitis without obstruction: Secondary | ICD-10-CM

## 2013-09-30 DIAGNOSIS — K219 Gastro-esophageal reflux disease without esophagitis: Secondary | ICD-10-CM | POA: Diagnosis present

## 2013-09-30 DIAGNOSIS — F3289 Other specified depressive episodes: Secondary | ICD-10-CM | POA: Diagnosis present

## 2013-09-30 DIAGNOSIS — Z79899 Other long term (current) drug therapy: Secondary | ICD-10-CM

## 2013-09-30 DIAGNOSIS — F32A Depression, unspecified: Secondary | ICD-10-CM | POA: Diagnosis present

## 2013-09-30 DIAGNOSIS — K56609 Unspecified intestinal obstruction, unspecified as to partial versus complete obstruction: Principal | ICD-10-CM | POA: Diagnosis present

## 2013-09-30 DIAGNOSIS — F329 Major depressive disorder, single episode, unspecified: Secondary | ICD-10-CM

## 2013-09-30 DIAGNOSIS — Z880 Allergy status to penicillin: Secondary | ICD-10-CM

## 2013-09-30 LAB — CBC WITH DIFFERENTIAL/PLATELET
BASOS PCT: 0 % (ref 0–1)
Basophils Absolute: 0 10*3/uL (ref 0.0–0.1)
EOS ABS: 0.1 10*3/uL (ref 0.0–0.7)
Eosinophils Relative: 1 % (ref 0–5)
HEMATOCRIT: 37.6 % (ref 36.0–46.0)
HEMOGLOBIN: 12.9 g/dL (ref 12.0–15.0)
LYMPHS ABS: 3 10*3/uL (ref 0.7–4.0)
Lymphocytes Relative: 47 % — ABNORMAL HIGH (ref 12–46)
MCH: 29.9 pg (ref 26.0–34.0)
MCHC: 34.3 g/dL (ref 30.0–36.0)
MCV: 87.2 fL (ref 78.0–100.0)
MONO ABS: 0.5 10*3/uL (ref 0.1–1.0)
MONOS PCT: 9 % (ref 3–12)
Neutro Abs: 2.7 10*3/uL (ref 1.7–7.7)
Neutrophils Relative %: 43 % (ref 43–77)
Platelets: 216 10*3/uL (ref 150–400)
RBC: 4.31 MIL/uL (ref 3.87–5.11)
RDW: 13.5 % (ref 11.5–15.5)
WBC: 6.3 10*3/uL (ref 4.0–10.5)

## 2013-09-30 LAB — COMPREHENSIVE METABOLIC PANEL
ALBUMIN: 3.9 g/dL (ref 3.5–5.2)
ALT: 18 U/L (ref 0–35)
AST: 18 U/L (ref 0–37)
Alkaline Phosphatase: 85 U/L (ref 39–117)
BILIRUBIN TOTAL: 0.3 mg/dL (ref 0.3–1.2)
BUN: 14 mg/dL (ref 6–23)
CHLORIDE: 102 meq/L (ref 96–112)
CO2: 23 mEq/L (ref 19–32)
CREATININE: 0.55 mg/dL (ref 0.50–1.10)
Calcium: 9.4 mg/dL (ref 8.4–10.5)
GFR calc non Af Amer: >90 mL/min (ref >90)
GLUCOSE: 104 mg/dL — AB (ref 70–99)
Potassium: 3.9 mEq/L (ref 3.7–5.3)
Sodium: 140 mEq/L (ref 137–147)
TOTAL PROTEIN: 7.4 g/dL (ref 6.0–8.3)

## 2013-09-30 LAB — LIPASE, BLOOD: LIPASE: 34 U/L (ref 11–59)

## 2013-09-30 LAB — URINALYSIS, ROUTINE W REFLEX MICROSCOPIC
Bilirubin Urine: NEGATIVE
GLUCOSE, UA: NEGATIVE mg/dL
Hgb urine dipstick: NEGATIVE
KETONES UR: NEGATIVE mg/dL
LEUKOCYTES UA: NEGATIVE
NITRITE: NEGATIVE
PROTEIN: NEGATIVE mg/dL
Specific Gravity, Urine: 1.02 (ref 1.005–1.030)
Urobilinogen, UA: 0.2 mg/dL (ref 0.0–1.0)
pH: 7 (ref 5.0–8.0)

## 2013-09-30 MED ORDER — ONDANSETRON HCL 4 MG/2ML IJ SOLN: 4.0000 mg | Freq: Once | INTRAMUSCULAR | Status: DC

## 2013-09-30 MED ORDER — ONDANSETRON HCL 4 MG PO TABS: 4.0000 mg | ORAL_TABLET | Freq: Four times a day (QID) | ORAL | Status: DC | PRN

## 2013-09-30 MED ORDER — ONDANSETRON HCL 4 MG/2ML IJ SOLN
4.0000 mg | Freq: Three times a day (TID) | INTRAMUSCULAR | Status: AC | PRN
  Filled 2013-09-30: qty 2

## 2013-09-30 MED ORDER — ONDANSETRON HCL 4 MG/2ML IJ SOLN
4.0000 mg | Freq: Once | INTRAMUSCULAR | Status: AC
  Administered 2013-09-30: 4 mg via INTRAVENOUS
  Filled 2013-09-30: qty 2

## 2013-09-30 MED ORDER — ACETAMINOPHEN 325 MG PO TABS: 650.0000 mg | ORAL_TABLET | Freq: Four times a day (QID) | ORAL | Status: DC | PRN

## 2013-09-30 MED ORDER — HYDROMORPHONE HCL PF 1 MG/ML IJ SOLN
1.0000 mg | INTRAMUSCULAR | Status: DC | PRN
  Administered 2013-09-30: 1 mg via INTRAVENOUS
  Filled 2013-09-30: qty 1

## 2013-09-30 MED ORDER — PANTOPRAZOLE SODIUM 40 MG IV SOLR
40.0000 mg | INTRAVENOUS | Status: DC
  Administered 2013-10-01 (×2): 40 mg via INTRAVENOUS
  Filled 2013-09-30 (×3): qty 40

## 2013-09-30 MED ORDER — ONDANSETRON HCL 4 MG/2ML IJ SOLN
4.0000 mg | Freq: Four times a day (QID) | INTRAMUSCULAR | Status: DC | PRN
  Administered 2013-10-01: 4 mg via INTRAVENOUS

## 2013-09-30 MED ORDER — HYDROMORPHONE HCL PF 1 MG/ML IJ SOLN
1.0000 mg | Freq: Once | INTRAMUSCULAR | Status: AC
  Administered 2013-09-30: 1 mg via INTRAVENOUS
  Filled 2013-09-30: qty 1

## 2013-09-30 MED ORDER — CALCIUM CARBONATE ANTACID 500 MG PO CHEW: 2.0000 | CHEWABLE_TABLET | ORAL | Status: DC | PRN

## 2013-09-30 MED ORDER — PAROXETINE HCL 20 MG PO TABS
40.0000 mg | ORAL_TABLET | Freq: Every day | ORAL | Status: DC
  Administered 2013-10-01 – 2013-10-02 (×2): 40 mg via ORAL
  Filled 2013-09-30 (×2): qty 2

## 2013-09-30 MED ORDER — IOHEXOL 300 MG/ML  SOLN
50.0000 mL | Freq: Once | INTRAMUSCULAR | Status: AC | PRN
  Administered 2013-09-30: 50 mL via ORAL

## 2013-09-30 MED ORDER — IOHEXOL 300 MG/ML  SOLN
100.0000 mL | Freq: Once | INTRAMUSCULAR | Status: AC | PRN
  Administered 2013-09-30: 100 mL via INTRAVENOUS

## 2013-09-30 MED ORDER — ACETAMINOPHEN 650 MG RE SUPP: 650.0000 mg | Freq: Four times a day (QID) | RECTAL | Status: DC | PRN

## 2013-09-30 MED ORDER — SODIUM CHLORIDE 0.9 % IV SOLN
INTRAVENOUS | Status: DC
  Administered 2013-09-30: via INTRAVENOUS

## 2013-09-30 NOTE — H&P (Signed)
[facility] History and Physical  Melissa Case WRU:[id number] DOB: 29-Sep-1960 DOA: 09/30/2013  Referring physician: ER physician PCP: Melissa Harvey, NP   Chief Complaint: abdominal pain  HPI:  53 y.o. female with past medical history of depression, total hysterectomy who presented to [facility] ED 10/01/2103 with worsening abdominal pain started today prior to this admission. Patient reported pain initially started in right lower quadrant of abdomen and then radiated to middle and sides. Her pain was sharp, intermittent and 8/10 in intensity and was relieved with analgesia given in ED. No associated nausea, vomiting. No reports or diarrhea. No reports of blood in stool. No fevers or chills. No chest pain, shortness of breath or palpitations.  In ED, vitals are stable. BP was 104/58, HR 68, T max 97.6 F and oxygen saturation 97% on room air. Pt did not have significant nausea but has had complaints of abdominal pain and was given total of 2 mg IV dilaudid and she reported feeling little better.  Assessment and Plan:  Active Problems:   Abdominal pain / SBO (small bowel obstruction) - possible due to adhesion; she did have total hysterectomy in past - she has no N/V; she does not require NG tube - continue IV fluids, analgesia as needed - keep NPO for now - continue protonix  Radiological Exams on Admission: Ct Abdomen Pelvis W Contrast 09/30/2013     IMPRESSION: Cholelithiasis without inflammatory changes at the gallbladder. Borderline distal small bowel distention with air-fluid levels probably representing enteritis although early or partial small-bowel obstruction is not excluded.      Code Status: Full Family Communication: Pt at bedside Disposition Plan: Admit for further evaluation  Robbie Lis, MD  [facility] Pager [phone]  Review of Systems:  Constitutional: Negative for fever, chills and malaise/fatigue. Negative for diaphoresis.  HENT: Negative for hearing  loss, ear pain, nosebleeds, congestion, sore throat, neck pain, tinnitus and ear discharge.   Eyes: Negative for blurred vision, double vision, photophobia, pain, discharge and redness.  Respiratory: Negative for cough, hemoptysis, sputum production, shortness of breath, wheezing and stridor.   Cardiovascular: Negative for chest pain, palpitations, orthopnea, claudication and leg swelling.  Gastrointestinal: per HPI.  Genitourinary: Negative for dysuria, urgency, frequency, hematuria and flank pain.  Musculoskeletal: Negative for myalgias, back pain, joint pain and falls.  Skin: Negative for itching and rash.  Neurological: Negative for dizziness and weakness. Negative for tingling, tremors, sensory change, speech change, focal weakness, loss of consciousness and headaches.  Endo/Heme/Allergies: Negative for environmental allergies and polydipsia. Does not bruise/bleed easily.  Psychiatric/Behavioral: Negative for suicidal ideas. The patient is not nervous/anxious.      Past Medical History  Diagnosis Date  . GERD (gastroesophageal reflux disease)    Past Surgical History  Procedure Laterality Date  . Abdominal hysterectomy     Social History:  reports that she has been smoking Cigarettes.  She has a 15 pack-year smoking history. She does not have any smokeless tobacco history on file. She reports that she does not drink alcohol or use illicit drugs.  Allergies  Allergen Reactions  . Penicillins Rash     Family History  Problem Relation Age of Onset  . Depression Sister      Prior to Admission medications   Medication Sig Start Date End Date Taking? Authorizing Provider  calcium carbonate (TUMS - DOSED IN MG ELEMENTAL CALCIUM) 500 MG chewable tablet Chew 2 tablets by mouth as needed for indigestion or heartburn (indigestion).   Yes Historical  Provider, MD  esomeprazole (NEXIUM) 40 MG capsule Take 40 mg by mouth daily before breakfast.   Yes Historical Provider, MD  PARoxetine  (PAXIL) 40 MG tablet TAKE 1 TABLET BY MOUTH EVERY MORNING   Yes Kathlee Nations, MD   Physical Exam: Filed Vitals:   09/30/13 1822 09/30/13 1827 09/30/13 2136  BP:  123/71 104/58  Pulse:  71 68  Temp:  97.6 F (36.4 C)   TempSrc:  Oral   Resp:  18 18  SpO2: 100% 100% 97%    Physical Exam  Constitutional: Appears well-developed and well-nourished. No distress.  HENT: Normocephalic. External right and left ear normal. Oropharynx is clear and moist.  Eyes: Conjunctivae and EOM are normal. PERRLA, no scleral icterus.  Neck: Normal ROM. Neck supple. No JVD. No tracheal deviation. No thyromegaly.  CVS: RRR, S1/S2 +, no murmurs, no gallops, no carotid bruit.  Pulmonary: Effort and breath sounds normal, no stridor, rhonchi, wheezes, rales.  Abdominal: Soft. BS +,  no distension, tenderness across mid and lower abdomen, no rebound or guarding.  Musculoskeletal: Normal range of motion. No edema and no tenderness.  Lymphadenopathy: No lymphadenopathy noted, cervical, inguinal. Neuro: Alert. Normal reflexes, muscle tone coordination. No cranial nerve deficit. Skin: Skin is warm and dry. No rash noted. Not diaphoretic. No erythema. No pallor.  Psychiatric: Normal mood and affect. Behavior, judgment, thought content normal.   Labs on Admission:  Basic Metabolic Panel:  Recent Labs Lab 09/30/13 1845  NA 140  K 3.9  CL 102  CO2 23  GLUCOSE 104*  BUN 14  CREATININE 0.55  CALCIUM 9.4   Liver Function Tests:  Recent Labs Lab 09/30/13 1845  AST 18  ALT 18  ALKPHOS 85  BILITOT 0.3  PROT 7.4  ALBUMIN 3.9    Recent Labs Lab 09/30/13 1845  LIPASE 34   No results found for this basename: AMMONIA,  in the last 168 hours CBC:  Recent Labs Lab 09/30/13 1845  WBC 6.3  NEUTROABS 2.7  HGB 12.9  HCT 37.6  MCV 87.2  PLT 216   Cardiac Enzymes: No results found for this basename: CKTOTAL, CKMB, CKMBINDEX, TROPONINI,  in the last 168 hours BNP: No components found with this  basename: POCBNP,  CBG: No results found for this basename: GLUCAP,  in the last 168 hours  If 7PM-7AM, please contact night-coverage [email or web address] Password [facility] 09/30/2013, 11:17 PM

## 2013-09-30 NOTE — ED Notes (Signed)
Per EMS pt coming from home with c/o abdominal pain; it originally started in RLQ, but it has since spread and pt sts generalized abd.pain; denies n/v.

## 2013-09-30 NOTE — ED Provider Notes (Signed)
CSN: [id number]     Arrival date & time 09/30/13  1821 History   First MD Initiated Contact with Patient 09/30/13 1849     Chief Complaint  Patient presents with  . Abdominal Pain     (Consider location/radiation/quality/duration/timing/severity/associated sxs/prior Treatment) HPI Melissa Case is a 53 y.o. female who presents to ED with complaint of abdominal pain. States abdominal pain began this afternoon while at work. Pain in right lower abdomen. Worsened with movement and palpation. Denies any nausea, vomiting, diarrhea. Last bowel movement was this morning and normal. States pain all across abdomen at this time. Nothing makes pain better. Pain is sharp. Denies urinary symptoms. No fever, chills. No back pain. No vaginal discharge. No hx of the same. States took some tums when started but did not help. Denies taking anything for pain.   Past Medical History  Diagnosis Date  . GERD (gastroesophageal reflux disease)    Past Surgical History  Procedure Laterality Date  . Abdominal hysterectomy     Family History  Problem Relation Age of Onset  . Depression Sister    History  Substance Use Topics  . Smoking status: Current Every Day Smoker -- 0.50 packs/day for 30 years    Types: Cigarettes  . Smokeless tobacco: Not on file  . Alcohol Use: No   OB History   Grav Para Term Preterm Abortions TAB SAB Ect Mult Living                 Review of Systems  Constitutional: Negative for fever and chills.  Respiratory: Negative for cough, chest tightness and shortness of breath.   Cardiovascular: Negative for chest pain, palpitations and leg swelling.  Gastrointestinal: Positive for abdominal pain. Negative for nausea, vomiting, diarrhea, constipation and blood in stool.  Genitourinary: Negative for dysuria, flank pain, vaginal bleeding, vaginal discharge, vaginal pain and pelvic pain.  Musculoskeletal: Negative for arthralgias, myalgias, neck pain and neck stiffness.  Skin:  Negative for rash.  Neurological: Negative for dizziness, weakness and headaches.  All other systems reviewed and are negative.     Allergies  Penicillins  Home Medications   Prior to Admission medications   Medication Sig Start Date End Date Taking? Authorizing Provider  calcium carbonate (TUMS - DOSED IN MG ELEMENTAL CALCIUM) 500 MG chewable tablet Chew 2 tablets by mouth as needed for indigestion or heartburn (indigestion).   Yes Historical Provider, MD  esomeprazole (NEXIUM) 40 MG capsule Take 40 mg by mouth daily before breakfast.   Yes Historical Provider, MD  PARoxetine (PAXIL) 40 MG tablet TAKE 1 TABLET BY MOUTH EVERY MORNING   Yes Kathlee Nations, MD   BP 123/71  Pulse 71  Temp(Src) 97.6 F (36.4 C) (Oral)  Resp 18  SpO2 100% Physical Exam  Nursing note and vitals reviewed. Constitutional: She appears well-developed and well-nourished. No distress.  HENT:  Head: Normocephalic.  Eyes: Conjunctivae are normal.  Neck: Neck supple.  Cardiovascular: Normal rate, regular rhythm and normal heart sounds.   Pulmonary/Chest: Effort normal and breath sounds normal. No respiratory distress. She has no wheezes. She has no rales.  Abdominal: Soft. Bowel sounds are normal. She exhibits no distension. There is no tenderness. There is no rebound and no guarding.  RLQ tenderness  Musculoskeletal: She exhibits no edema.  Neurological: She is alert.  Skin: Skin is warm and dry.  Psychiatric: She has a normal mood and affect. Her behavior is normal.    ED Course  Procedures (including critical care  time) Labs Review Labs Reviewed  CBC WITH DIFFERENTIAL - Abnormal; Notable for the following:    Lymphocytes Relative 47 (*)    All other components within normal limits  COMPREHENSIVE METABOLIC PANEL - Abnormal; Notable for the following:    Glucose, Bld 104 (*)    All other components within normal limits  URINALYSIS, ROUTINE W REFLEX MICROSCOPIC  LIPASE, BLOOD  CBC  COMPREHENSIVE  METABOLIC PANEL    Imaging Review Ct Abdomen Pelvis W Contrast  09/30/2013   CLINICAL DATA:  Abdominal pain starting in the right lower quadrant but since generalized.  EXAM: CT ABDOMEN AND PELVIS WITH CONTRAST  TECHNIQUE: Multidetector CT imaging of the abdomen and pelvis was performed using the standard protocol following bolus administration of intravenous contrast.  CONTRAST:  28mL OMNIPAQUE IOHEXOL 300 MG/ML SOLN, 155mL OMNIPAQUE IOHEXOL 300 MG/ML SOLN  COMPARISON:  US ABDOMEN COMPLETE dated 03/03/2012  FINDINGS: Atelectasis in the lung bases. Cholelithiasis with large stones in the gallbladder. No wall thickening or bowel duct dilatation. The liver, spleen, pancreas, adrenal glands, kidneys, inferior vena cava, and retroperitoneal lymph nodes are unremarkable. Calcification of the abdominal aorta without aneurysm. The stomach is decompressed. Mid/distal small bowel loops are mildly prominent without significant distention and are fluid filled. Possible mild small bowel wall thickening. Changes likely represent enteritis although early or partial obstruction is not excluded. Stool-filled colon without distention. No free air or free fluid in the abdomen.  Pelvis: Small amount of free fluid in the pelvis is of nonspecific etiology. Uterus appears to be surgically absent. No pelvic mass or lymphadenopathy. No evidence of diverticulitis. The appendix is normal. Degenerative changes in the spine. No destructive bone lesions appreciated.  IMPRESSION: Cholelithiasis without inflammatory changes at the gallbladder. Borderline distal small bowel distention with air-fluid levels probably representing enteritis although early or partial small-bowel obstruction is not excluded.   Electronically Signed   By: Lucienne Capers M.D.   On: 09/30/2013 21:12     EKG Interpretation None      MDM   Final diagnoses:  Abdominal pain    Patient with sudden onset of right lower quadrant pain that started today. No  nausea vomiting. History of abdominal hysterectomy. No vaginal complaints. No urinary complaints. Will get labs and CT abdomen and pelvis to rule out appendicitis   White blood cell count is normal. Electrolytes all normal. Urinalysis unremarkable. CT abdomen pelvis resulted concerning for possible small bowel obstruction versus enteritis. Analysis showed cholelithiasis however patient has no right upper quadrant, normal LFTs, normal lipase. Given the finding of possible small bowel obstruction versus enteritis will bring her for observation. Pt has no vomiting or nausea at this time. No NG tube orderd. Pain controled with dilaudid IV. Spoke with [facility], will admit.   Filed Vitals:   09/30/13 1822 09/30/13 1827 09/30/13 2136 10/01/13 0000  BP:  123/71 104/58 120/66  Pulse:  71 68 64  Temp:  97.6 F (36.4 C)  97.6 F (36.4 C)  TempSrc:  Oral  Oral  Resp:  18 18 16   Height:    5\' 4"  (1.626 m)  Weight:    184 lb 1.4 oz (83.5 kg)  SpO2: 100% 100% 97% 97%      Renold Genta, PA-C 10/01/13 0013  Florene Route Annlee Glandon, PA-C 10/01/13 0014

## 2013-10-01 ENCOUNTER — Inpatient Hospital Stay (HOSPITAL_COMMUNITY): Payer: Managed Care, Other (non HMO)

## 2013-10-01 LAB — CBC
HCT: 36.8 % (ref 36.0–46.0)
Hemoglobin: 12.1 g/dL (ref 12.0–15.0)
MCH: 29.5 pg (ref 26.0–34.0)
MCHC: 32.9 g/dL (ref 30.0–36.0)
MCV: 89.8 fL (ref 78.0–100.0)
Platelets: 207 10*3/uL (ref 150–400)
RBC: 4.1 MIL/uL (ref 3.87–5.11)
RDW: 13.8 % (ref 11.5–15.5)
WBC: 7.1 10*3/uL (ref 4.0–10.5)

## 2013-10-01 LAB — COMPREHENSIVE METABOLIC PANEL
ALT: 17 U/L (ref 0–35)
AST: 17 U/L (ref 0–37)
Albumin: 3.5 g/dL (ref 3.5–5.2)
Alkaline Phosphatase: 79 U/L (ref 39–117)
BUN: 10 mg/dL (ref 6–23)
CO2: 28 mEq/L (ref 19–32)
Calcium: 9 mg/dL (ref 8.4–10.5)
Chloride: 105 mEq/L (ref 96–112)
Creatinine, Ser: 0.6 mg/dL (ref 0.50–1.10)
GFR calc non Af Amer: >90 mL/min (ref >90)
Glucose, Bld: 95 mg/dL (ref 70–99)
Potassium: 3.9 mEq/L (ref 3.7–5.3)
SODIUM: 141 meq/L (ref 137–147)
TOTAL PROTEIN: 6.5 g/dL (ref 6.0–8.3)
Total Bilirubin: 0.3 mg/dL (ref 0.3–1.2)

## 2013-10-01 LAB — GLUCOSE, CAPILLARY: GLUCOSE-CAPILLARY: 92 mg/dL (ref 70–99)

## 2013-10-01 MED ORDER — PNEUMOCOCCAL VAC POLYVALENT 25 MCG/0.5ML IJ INJ
0.5000 mL | INJECTION | INTRAMUSCULAR | Status: AC
  Administered 2013-10-02: 0.5 mL via INTRAMUSCULAR
  Filled 2013-10-01 (×2): qty 0.5

## 2013-10-01 NOTE — Care Management Note (Signed)
CARE MANAGEMENT NOTE 10/01/2013  Patient:  Melissa Case, Melissa Case   Account Number:  [id number]  Date Initiated:  10/01/2013  Documentation initiated by:  Truda Staub  Subjective/Objective Assessment:   53 yo female admitted with SBO. PCP: Berkley Harvey, NP     Action/Plan:   Home when stable   Anticipated DC Date:     Anticipated DC Plan:  [place]  CM consult      Choice offered to / List presented to:  NA   DME arranged  NA      DME agency  NA     [place] arranged  NA      [place] agency  NA   Status of service:  Completed, signed off Medicare Important Message given?   (If response is "NO", the following Medicare IM given date fields will be blank) Date Medicare IM given:   Date Additional Medicare IM given:    Discharge Disposition:    Per UR Regulation:  Reviewed for med. necessity/level of care/duration of stay  If discussed at [place] of Stay Meetings, dates discussed:    Comments:  10/01/13 [place] [phone] Chart reviewed for utilization of services. No needs assessed at this time.

## 2013-10-01 NOTE — ED Provider Notes (Signed)
Medical screening examination/treatment/procedure(s) were performed by non-physician practitioner and as supervising physician I was immediately available for consultation/collaboration.   Leota Jacobsen, MD 10/01/13 [phone]

## 2013-10-01 NOTE — Progress Notes (Signed)
Nutrition Brief Note  Patient identified on the Malnutrition Screening Tool (MST) Report  Wt Readings from Last 15 Encounters:  10/01/13 177 lb 14.6 oz (80.7 kg)  03/14/13 178 lb (80.74 kg)  11/15/12 176 lb (79.833 kg)  08/16/12 172 lb (78.019 kg)  04/05/12 172 lb (78.019 kg)  02/02/12 181 lb (82.101 kg)  09/15/11 179 lb 6.4 oz (81.375 kg)    Body mass index is 30.52 kg/(m^2). Patient meets criteria for Obesity I based on current BMI.   Current diet order is NPO for SBO. Labs and medications reviewed.   Patient denied any unintentional wt loss or significant appetite changes, abd pain only began yesterday and did not impact PO intake. Denied n/v. Eager for diet advancement   No nutrition interventions warranted at this time. If nutrition issues arise, please consult RD.   Atlee Abide MS RD LDN Clinical Dietitian [phone]

## 2013-10-01 NOTE — Progress Notes (Signed)
[facility] PROGRESS NOTE  SULMA RUFFINO QQI:[id number] DOB: [birth year] DOA: 09/30/2013 PCP: Berkley Harvey, NP  Brief narrative: 53 y.o. female with past medical history of depression, total hysterectomy who presented to [facility] ED [facility]/01/2103 with worsening abdominal pain started on the day of this admission. Patient reported pain initially started in right lower quadrant of abdomen and then radiated to middle and sides. Her pain was sharp, intermittent and 8/10 in intensity and was relieved with analgesia given in ED. No associated nausea, vomiting, blood in stool. Patient was found to have possible early or partial small bowel obstruction based on CT abdomen.  Assessment and Plan:   Active Problems:  Abdominal pain / SBO (small bowel obstruction)  - Unclear ideology. Patient is feeling better this morning. She did not require NG tube placement - We repeated x-ray this morning which shows unremarkable bowel gas pattern - Advance diet to clear liquid  - Continue protonix  - Continue antiemetics as needed  Depression - continue Prozac 40 mg daily   Code Status: Full  Family Communication: Pt at bedside  Disposition Plan: Admit for further evaluation   Robbie Lis, MD  [facility] Pager [phone]  If 7PM-7AM, please contact night-coverage [email or web address] Password [email or web address] 10/01/2013, 1:14 PM   LOS: 1 day   Consultants:  None   Procedures:  None   Antibiotics:  None   HPI/Subjective: Feels better, but has nausea.   Objective: Filed Vitals:   09/30/13 1827 09/30/13 2136 10/01/13 0000 10/01/13 0557  BP: 123/71 104/58 120/66 110/63  Pulse: 71 68 64 63  Temp: 97.6 F (36.4 C)  97.6 F (36.4 C) 98.1 F (36.7 C)  TempSrc: Oral  Oral Oral  Resp: 18 18 16 20   Height:   5\' 4"  (1.626 m)   Weight:   83.5 kg (184 lb 1.4 oz) 80.7 kg (177 lb 14.6 oz)  SpO2: 100% 97% 97% 97%    Intake/Output Summary (Last 24 hours) at 10/01/13 1314 Last data filed at 10/01/13  1313  Gross per 24 hour  Intake   1213 ml  Output   1400 ml  Net   -187 ml    Exam:   General:  Pt is alert, follows commands appropriately, not in acute distress  Cardiovascular: Regular rate and rhythm, S1/S2, no murmurs, no rubs, no gallops  Respiratory: Clear to auscultation bilaterally, no wheezing, no crackles, no rhonchi  Abdomen: Soft, non tender, non distended, bowel sounds present, no guarding  Extremities: No edema, pulses DP and PT palpable bilaterally  Neuro: Grossly nonfocal  Data Reviewed: Basic Metabolic Panel:  Recent Labs Lab 09/30/13 1845 10/01/13 0317  NA 140 141  K 3.9 3.9  CL 102 105  CO2 23 28  GLUCOSE 104* 95  BUN 14 10  CREATININE 0.55 0.60  CALCIUM 9.4 9.0   Liver Function Tests:  Recent Labs Lab 09/30/13 1845 10/01/13 0317  AST 18 17  ALT 18 17  ALKPHOS 85 79  BILITOT 0.3 0.3  PROT 7.4 6.5  ALBUMIN 3.9 3.5    Recent Labs Lab 09/30/13 1845  LIPASE 34   No results found for this basename: AMMONIA,  in the last 168 hours CBC:  Recent Labs Lab 09/30/13 1845 10/01/13 0317  WBC 6.3 7.1  NEUTROABS 2.7  --   HGB 12.9 12.1  HCT 37.6 36.8  MCV 87.2 89.8  PLT 216 207   Cardiac Enzymes: No results found for this basename: CKTOTAL, CKMB, CKMBINDEX, TROPONINI,  in  the last 168 hours BNP: No components found with this basename: POCBNP,  CBG:  Recent Labs Lab 10/01/13 0756  GLUCAP 92    No results found for this or any previous visit (from the past 240 hour(s)).   Studies: Ct Abdomen Pelvis W Contrast 09/30/2013    IMPRESSION: Cholelithiasis without inflammatory changes at the gallbladder. Borderline distal small bowel distention with air-fluid levels probably representing enteritis although early or partial small-bowel obstruction is not excluded.    Dg Abd Portable 1v 10/01/2013    IMPRESSION: Bowel gas pattern unremarkable. No obstruction or free air seen on this supine examination.     Scheduled Meds: .  pantoprazole (PROTONIX) IV  40 mg Intravenous Q24H  . PARoxetine  40 mg Oral Daily

## 2013-10-02 LAB — GLUCOSE, CAPILLARY: GLUCOSE-CAPILLARY: 140 mg/dL — AB (ref 70–99)

## 2013-10-02 NOTE — Discharge Summary (Signed)
Physician Discharge Summary  Melissa Case DOB: August 20, 1960 DOA: 09/30/2013  PCP: Berkley Harvey, NP  Admit date: 09/30/2013 Discharge date: 10/02/2013  Recommendations for Outpatient Follow-up:  1. Pt needs to follow up with PCP in 1-2 weeks after discharge to make sure symptoms are being controlled. Check routine CBC and BMP at PCP recommendations.  Discharge Diagnoses:  Principal Problem:   Abdominal pain Active Problems:   SBO (small bowel obstruction)   Depression   Barrett esophagus    Discharge Condition: medically stable for discharge home today   Diet recommendation: as tolerated   History of present illness:  53 y.o. female with past medical history of depression, total hysterectomy who presented to [facility] ED 10/01/2103 with worsening abdominal pain started on the day of this admission. Patient reported pain initially started in right lower quadrant of abdomen and then radiated to middle and sides. Her pain was sharp, intermittent and 8/10 in intensity and was relieved with analgesia given in ED. No associated nausea, vomiting, blood in stool. Patient was found to have possible early or partial small bowel obstruction based on CT abdomen.   Assessment and Plan:   Active Problems:  Abdominal pain / SBO (small bowel obstruction)  - Unclear etiology. Patient much better this am and has tolerated regular diet   - Weobtianed x-ray which showed unremarkable bowel gas pattern  - Continue protonix  - Continue antiemetics as needed  Depression  - continue Prozac 40 mg daily   Code Status: Full  Family Communication: Pt at bedside   Consultants:  None  Procedures:  None  Antibiotics:  None    Signed:  Robbie Lis, MD  [facility] 10/02/2013, 1:06 PM  Pager #: [phone]   Discharge Exam: Filed Vitals:   10/02/13 0632  BP: 114/72  Pulse: 77  Temp: 97.9 F (36.6 C)  Resp: 18   Filed Vitals:   10/01/13 1500 10/01/13 2149 10/02/13  0632 10/02/13 0646  BP: 115/60 97/60 114/72   Pulse: 61 67 77   Temp: 98.3 F (36.8 C) 98.2 F (36.8 C) 97.9 F (36.6 C)   TempSrc: Oral Oral Oral   Resp: 20 19 18    Height:      Weight:    80.7 kg (177 lb 14.6 oz)  SpO2: 96% 97% 99%     General: Pt is alert, follows commands appropriately, not in acute distress Cardiovascular: Regular rate and rhythm, S1/S2 +, no murmurs, no rubs, no gallops Respiratory: Clear to auscultation bilaterally, no wheezing, no crackles, no rhonchi Abdominal: Soft, non tender, non distended, bowel sounds +, no guarding Extremities: no edema, no cyanosis, pulses palpable bilaterally DP and PT Neuro: Grossly nonfocal  Discharge Instructions  Discharge Orders   Future Orders Complete By Expires   Call MD for:  difficulty breathing, headache or visual disturbances  As directed    Call MD for:  persistant dizziness or light-headedness  As directed    Call MD for:  persistant nausea and vomiting  As directed    Call MD for:  severe uncontrolled pain  As directed    Diet - low sodium heart healthy  As directed    Discharge instructions  As directed    Increase activity slowly  As directed        Medication List         calcium carbonate 500 MG chewable tablet  Commonly known as:  TUMS - dosed in mg elemental calcium  Chew 2 tablets  by mouth as needed for indigestion or heartburn (indigestion).     esomeprazole 40 MG capsule  Commonly known as:  NEXIUM  Take 40 mg by mouth daily before breakfast.     PARoxetine 40 MG tablet  Commonly known as:  PAXIL  TAKE 1 TABLET BY MOUTH EVERY MORNING           Follow-up Information   Follow up with Berkley Harvey, NP. Schedule an appointment as soon as possible for a visit in 2 weeks.   Specialty:  Nurse Practitioner   Contact information:   [address] [place] [address] [phone]        The results of significant diagnostics from this hospitalization (including imaging,  microbiology, ancillary and laboratory) are listed below for reference.    Significant Diagnostic Studies: Ct Abdomen Pelvis W Contrast  09/30/2013   CLINICAL DATA:  Abdominal pain starting in the right lower quadrant but since generalized.  EXAM: CT ABDOMEN AND PELVIS WITH CONTRAST  TECHNIQUE: Multidetector CT imaging of the abdomen and pelvis was performed using the standard protocol following bolus administration of intravenous contrast.  CONTRAST:  89mL OMNIPAQUE IOHEXOL 300 MG/ML SOLN, 152mL OMNIPAQUE IOHEXOL 300 MG/ML SOLN  COMPARISON:  US ABDOMEN COMPLETE dated 03/03/2012  FINDINGS: Atelectasis in the lung bases. Cholelithiasis with large stones in the gallbladder. No wall thickening or bowel duct dilatation. The liver, spleen, pancreas, adrenal glands, kidneys, inferior vena cava, and retroperitoneal lymph nodes are unremarkable. Calcification of the abdominal aorta without aneurysm. The stomach is decompressed. Mid/distal small bowel loops are mildly prominent without significant distention and are fluid filled. Possible mild small bowel wall thickening. Changes likely represent enteritis although early or partial obstruction is not excluded. Stool-filled colon without distention. No free air or free fluid in the abdomen.  Pelvis: Small amount of free fluid in the pelvis is of nonspecific etiology. Uterus appears to be surgically absent. No pelvic mass or lymphadenopathy. No evidence of diverticulitis. The appendix is normal. Degenerative changes in the spine. No destructive bone lesions appreciated.  IMPRESSION: Cholelithiasis without inflammatory changes at the gallbladder. Borderline distal small bowel distention with air-fluid levels probably representing enteritis although early or partial small-bowel obstruction is not excluded.   Electronically Signed   By: Lucienne Capers M.D.   On: 09/30/2013 21:12   Dg Abd Portable 1v  10/01/2013   CLINICAL DATA:  Bowel obstruction  EXAM: PORTABLE ABDOMEN  - 1 VIEW  COMPARISON:  CT abdomen and pelvis Sep 30, 2013  FINDINGS: Contrast is seen in the colon. The bowel gas pattern appears unremarkable on this supine examination. No free air seen. There are surgical clips in the lower abdomen and pelvis. There are phleboliths in the pelvis.  IMPRESSION: Bowel gas pattern unremarkable. No obstruction or free air seen on this supine examination.   Electronically Signed   By: Lowella Grip M.D.   On: 10/01/2013 11:33    Microbiology: No results found for this or any previous visit (from the past 240 hour(s)).   Labs: Basic Metabolic Panel:  Recent Labs Lab 09/30/13 1845 10/01/13 0317  NA 140 141  K 3.9 3.9  CL 102 105  CO2 23 28  GLUCOSE 104* 95  BUN 14 10  CREATININE 0.55 0.60  CALCIUM 9.4 9.0   Liver Function Tests:  Recent Labs Lab 09/30/13 1845 10/01/13 0317  AST 18 17  ALT 18 17  ALKPHOS 85 79  BILITOT 0.3 0.3  PROT 7.4 6.5  ALBUMIN  3.9 3.5    Recent Labs Lab 09/30/13 1845  LIPASE 34   No results found for this basename: AMMONIA,  in the last 168 hours CBC:  Recent Labs Lab 09/30/13 1845 10/01/13 0317  WBC 6.3 7.1  NEUTROABS 2.7  --   HGB 12.9 12.1  HCT 37.6 36.8  MCV 87.2 89.8  PLT 216 207   Cardiac Enzymes: No results found for this basename: CKTOTAL, CKMB, CKMBINDEX, TROPONINI,  in the last 168 hours BNP: BNP (last 3 results) No results found for this basename: PROBNP,  in the last 8760 hours CBG:  Recent Labs Lab 10/01/13 0756 10/02/13 0748  GLUCAP 92 140*    Time coordinating discharge: Over 30 minutes

## 2013-10-02 NOTE — Discharge Instructions (Signed)
Small Bowel Obstruction A small bowel obstruction is a blockage (obstruction) of the small intestine (small bowel). The small bowel is a long, slender tube that connects the stomach to the colon. Its job is to absorb nutrients from the fluids and foods you consume into the bloodstream.  CAUSES  There are many causes of intestinal blockage. The most common ones include:  Hernias. This is a more common cause in children than adults.  Inflammatory bowel disease (enteritis and colitis).  Twisting of the bowel (volvulus).  Tumors.  Scar tissue (adhesions) from previous surgery or radiation treatment.  Recent surgery. This may cause an acute small bowel obstruction called an ileus. SYMPTOMS   Abdominal pain. This may be dull cramps or sharp pain. It may occur in one area or may be present in the entire abdomen. Pain can range from mild to severe, depending on the degree of obstruction.  Nausea and vomiting. Vomit may be greenish or yellow bile color.  Distended or swollen stomach. Abdominal bloating is a common symptom.  Constipation.  Lack of passing gas.  Frequent belching.  Diarrhea. This may occur if runny stool is able to leak around the obstruction. DIAGNOSIS  Your caregiver can usually diagnose small bowel obstruction by taking a history, doing a physical exam, and taking X-rays. If the cause is unclear, a CT scan (computerized tomography) of your abdomen and pelvis may be needed. TREATMENT  Treatment of the blockage depends on the cause and how bad the problem is.   Sometimes, the obstruction improves with bed rest and intravenous (IV) fluids.  Resting the bowel is very important. This means following a simple diet. Sometimes, a clear liquid diet may be required for several days.  Sometimes, a small tube (nasogastric tube) is placed into the stomach to decompress the bowel. When the bowel is blocked, it usually swells up like a balloon filled with air and fluids.  Decompression means that the air and fluids are removed by suction through that tube. This can help with pain, discomfort, and nausea. It can also help the obstruction resolve faster.  Surgery may be required if other treatments do not work. Bowel obstruction from a hernia may require early surgery and can be an emergency procedure. Adhesions that cause frequent or severe obstructions may also require surgery. HOME CARE INSTRUCTIONS If your bowel obstruction is only partial or incomplete, you may be allowed to go home.  Get plenty of rest.  Follow your diet as directed by your caregiver.  Only consume clear liquids until your condition improves.  Avoid solid foods as instructed. SEEK IMMEDIATE MEDICAL CARE IF:  You have increased pain or cramping.  You vomit blood.  You have uncontrolled vomiting or nausea.  You cannot drink fluids due to vomiting or pain.  You develop confusion.  You begin feeling very dry or thirsty (dehydrated).  You have severe bloating.  You have chills.  You have a fever.  You feel extremely weak or you faint. MAKE SURE YOU:  Understand these instructions.  Will watch your condition.  Will get help right away if you are not doing well or get worse. Document Released: 07/25/2005 Document Revised: 07/31/2011 Document Reviewed: 07/22/2010 Exit[employer] Patient Information 2014 [employer].  

## 2013-10-17 ENCOUNTER — Ambulatory Visit (HOSPITAL_COMMUNITY): Payer: Self-pay | Admitting: Psychiatry

## 2013-11-07 ENCOUNTER — Ambulatory Visit (HOSPITAL_COMMUNITY): Payer: Self-pay | Admitting: Psychiatry

## 2013-11-28 ENCOUNTER — Encounter (HOSPITAL_COMMUNITY): Payer: Self-pay | Admitting: Psychiatry

## 2013-11-28 ENCOUNTER — Ambulatory Visit (INDEPENDENT_AMBULATORY_CARE_PROVIDER_SITE_OTHER): Payer: 59 | Admitting: Psychiatry

## 2013-11-28 VITALS — Wt 176.0 lb

## 2013-11-28 DIAGNOSIS — F3289 Other specified depressive episodes: Secondary | ICD-10-CM

## 2013-11-28 DIAGNOSIS — F329 Major depressive disorder, single episode, unspecified: Secondary | ICD-10-CM

## 2013-11-28 MED ORDER — PAROXETINE HCL 40 MG PO TABS: ORAL_TABLET | ORAL | Status: DC

## 2013-11-28 NOTE — Progress Notes (Signed)
[facility] [phone] Progress Note  Melissa Case [id number] 52 y.o.  11/28/2013 10:07 AM  Chief Complaint: Medication management and followup.  History of Present Illness: Melissa Case came for her followup appointment .  She missed her last appointment because she was admitted because of abdominal pain.  She is feeling better now.  Heart medications were changed and now she is taking Dexilant.  She denies any recent agitation, anger, Motrin.  Her depression her anxiety is well controlled on Paxil.  She does not have any side effects of medication.  She had a good July 4 weekend.  She was able to see her family members.  Patient denies any recent panic attack.  She has no tremors or shakes.  She was to continue her current psychotropic medication.  Her weight is stable.  Her appetite is okay.  She is not drinking or using any illegal substances.  Suicidal Ideation: No Plan Formed: No Patient has means to carry out plan: No  Homicidal Ideation: No Plan Formed: No Patient has means to carry out plan: No  Review of Systems: Psychiatric: Agitation: No Hallucination: No Depressed Mood: No Insomnia: No Hypersomnia: No Altered Concentration: No Feels Worthless: No Grandiose Ideas: No Belief In Special Powers: No New/Increased Substance Abuse: No Compulsions: No  Neurologic: Headache: No Seizure: No Paresthesias: No  Medical History:  Patient has a history of acid reflux and barrett esophagus .  Her primary care physician is Eldridge Abrahams and she see Dr. Tiffany Kocher at [facility] for her GI issues.    Psychosocial history. Patient lives with her husband and her father.  She has 5 children.  She has given up one daughter for adoption.  She has limited contact with her daughter.  She is very close to her son however most of her children are out of town.  She works and like her job.   Outpatient Encounter Prescriptions as of 11/28/2013  Medication Sig   . calcium carbonate (TUMS - DOSED IN MG ELEMENTAL CALCIUM) 500 MG chewable tablet Chew 2 tablets by mouth as needed for indigestion or heartburn (indigestion).  Marland Kitchen dexlansoprazole (DEXILANT) 60 MG capsule Take by mouth.  Marland Kitchen PARoxetine (PAXIL) 40 MG tablet TAKE 1 TABLET BY MOUTH EVERY MORNING  . [DISCONTINUED] esomeprazole (NEXIUM) 40 MG capsule Take 40 mg by mouth daily before breakfast.  . [DISCONTINUED] PARoxetine (PAXIL) 40 MG tablet TAKE 1 TABLET BY MOUTH EVERY MORNING    Past Psychiatric History/Hospitalization(s): Anxiety: Yes Bipolar Disorder: No Depression: Yes Mania: No Psychosis: No Schizophrenia: No Personality Disorder: No Hospitalization for psychiatric illness: Yes History of Electroconvulsive Shock Therapy: No Prior Suicide Attempts: No  Physical Exam: Constitutional:  Wt 176 lb (79.833 kg)  General Appearance: alert, oriented, no acute distress and well nourished  Musculoskeletal: Strength & Muscle Tone: within normal limits Gait & Station: normal Patient leans: N/A  Psychiatric: Speech (describe rate, volume, coherence, spontaneity, and abnormalities if any): Clear and coherent  Thought Process (describe rate, content, abstract reasoning, and computation): Logical and goal-directed  Associations: Coherent and Intact  Thoughts: normal  Mental Status: Orientation: oriented to person, place, time/date and situation Mood & Affect: anxiety Attention Span & Concentration: Fair  Established Problem, Stable/Improving (1), Review of Psycho-Social Stressors (1), Review of Last Therapy Session (1) and Review of Medication Regimen & Side Effects (2)  Assessment: Axis I: Depressive disorder NOS  Axis II: Deferred  Axis III:  Patient Active Problem List   Diagnosis Date Noted  .  Abdominal pain 09/30/2013  . SBO (small bowel obstruction) 09/30/2013  . 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 Gall stone 04/05/2012  . Depression 09/15/2011   Axis IV: Mild  Axis V:  55-75   Plan:  Patient is doing better on Paxil 40 mg.  She has no side effects and her depression and anxiety is well controlled.  I will continue Paxil 40 mg daily.  I recommended to call us back if she has any questions or concerns.  Followup in 3 months.   ARFEEN,SYED T., MD 11/28/2013

## 2014-01-30 DIAGNOSIS — R300 Dysuria: Secondary | ICD-10-CM | POA: Insufficient documentation

## 2014-01-30 DIAGNOSIS — Z880 Allergy status to penicillin: Secondary | ICD-10-CM | POA: Diagnosis not present

## 2014-01-30 DIAGNOSIS — F17200 Nicotine dependence, unspecified, uncomplicated: Secondary | ICD-10-CM | POA: Diagnosis not present

## 2014-01-30 DIAGNOSIS — R110 Nausea: Secondary | ICD-10-CM | POA: Insufficient documentation

## 2014-01-30 DIAGNOSIS — R109 Unspecified abdominal pain: Secondary | ICD-10-CM | POA: Diagnosis not present

## 2014-01-30 DIAGNOSIS — K219 Gastro-esophageal reflux disease without esophagitis: Secondary | ICD-10-CM | POA: Insufficient documentation

## 2014-01-30 DIAGNOSIS — Z79899 Other long term (current) drug therapy: Secondary | ICD-10-CM | POA: Diagnosis not present

## 2014-01-30 DIAGNOSIS — Z90710 Acquired absence of both cervix and uterus: Secondary | ICD-10-CM | POA: Insufficient documentation

## 2014-01-30 DIAGNOSIS — Z9889 Other specified postprocedural states: Secondary | ICD-10-CM | POA: Insufficient documentation

## 2014-01-31 ENCOUNTER — Emergency Department (HOSPITAL_COMMUNITY)
Admission: EM | Admit: 2014-01-31 | Discharge: 2014-01-31 | Disposition: A | Discharge status: Home / Self Care | Payer: Managed Care, Other (non HMO) | Attending: Emergency Medicine | Admitting: Emergency Medicine

## 2014-01-31 ENCOUNTER — Encounter (HOSPITAL_COMMUNITY): Payer: Self-pay | Admitting: Emergency Medicine

## 2014-01-31 ENCOUNTER — Emergency Department (HOSPITAL_COMMUNITY): Payer: Managed Care, Other (non HMO)

## 2014-01-31 DIAGNOSIS — R109 Unspecified abdominal pain: Secondary | ICD-10-CM

## 2014-01-31 LAB — URINALYSIS, ROUTINE W REFLEX MICROSCOPIC
BILIRUBIN URINE: NEGATIVE
GLUCOSE, UA: NEGATIVE mg/dL
HGB URINE DIPSTICK: NEGATIVE
KETONES UR: NEGATIVE mg/dL
Nitrite: NEGATIVE
Protein, ur: NEGATIVE mg/dL
Specific Gravity, Urine: 1.024 (ref 1.005–1.030)
Urobilinogen, UA: 1 mg/dL (ref 0.0–1.0)
pH: 6 (ref 5.0–8.0)

## 2014-01-31 LAB — URINE MICROSCOPIC-ADD ON

## 2014-01-31 LAB — CBC WITH DIFFERENTIAL/PLATELET
Basophils Absolute: 0 10*3/uL (ref 0.0–0.1)
Basophils Relative: 1 % (ref 0–1)
Eosinophils Absolute: 0.2 10*3/uL (ref 0.0–0.7)
Eosinophils Relative: 2 % (ref 0–5)
HCT: 38.2 % (ref 36.0–46.0)
HEMOGLOBIN: 12.7 g/dL (ref 12.0–15.0)
LYMPHS PCT: 44 % (ref 12–46)
Lymphs Abs: 3.3 10*3/uL (ref 0.7–4.0)
MCH: 29.8 pg (ref 26.0–34.0)
MCHC: 33.2 g/dL (ref 30.0–36.0)
MCV: 89.7 fL (ref 78.0–100.0)
MONO ABS: 0.6 10*3/uL (ref 0.1–1.0)
MONOS PCT: 7 % (ref 3–12)
NEUTROS ABS: 3.5 10*3/uL (ref 1.7–7.7)
NEUTROS PCT: 46 % (ref 43–77)
Platelets: 217 10*3/uL (ref 150–400)
RBC: 4.26 MIL/uL (ref 3.87–5.11)
RDW: 13.4 % (ref 11.5–15.5)
WBC: 7.5 10*3/uL (ref 4.0–10.5)

## 2014-01-31 LAB — I-STAT CHEM 8, ED
BUN: 18 mg/dL (ref 6–23)
CALCIUM ION: 1.08 mmol/L — AB (ref 1.12–1.23)
Chloride: 104 mEq/L (ref 96–112)
Creatinine, Ser: 0.6 mg/dL (ref 0.50–1.10)
Glucose, Bld: 105 mg/dL — ABNORMAL HIGH (ref 70–99)
HCT: 40 % (ref 36.0–46.0)
Hemoglobin: 13.6 g/dL (ref 12.0–15.0)
POTASSIUM: 4.4 meq/L (ref 3.7–5.3)
Sodium: 137 mEq/L (ref 137–147)
TCO2: 27 mmol/L (ref 0–100)

## 2014-01-31 MED ORDER — IOHEXOL 300 MG/ML  SOLN
100.0000 mL | Freq: Once | INTRAMUSCULAR | Status: AC | PRN
  Administered 2014-01-31: 100 mL via INTRAVENOUS

## 2014-01-31 MED ORDER — DICYCLOMINE HCL 20 MG PO TABS: 20.0000 mg | ORAL_TABLET | Freq: Two times a day (BID) | ORAL | Status: DC

## 2014-01-31 MED ORDER — ONDANSETRON HCL 4 MG/2ML IJ SOLN
4.0000 mg | Freq: Once | INTRAMUSCULAR | Status: AC
  Administered 2014-01-31: 4 mg via INTRAVENOUS
  Filled 2014-01-31: qty 2

## 2014-01-31 MED ORDER — IOHEXOL 300 MG/ML  SOLN
50.0000 mL | Freq: Once | INTRAMUSCULAR | Status: AC | PRN
  Administered 2014-01-31: 50 mL via ORAL

## 2014-01-31 MED ORDER — HYDROMORPHONE HCL PF 1 MG/ML IJ SOLN
1.0000 mg | Freq: Once | INTRAMUSCULAR | Status: AC
  Administered 2014-01-31: 1 mg via INTRAVENOUS
  Filled 2014-01-31: qty 1

## 2014-01-31 MED ORDER — ONDANSETRON 4 MG PO TBDP: 4.0000 mg | ORAL_TABLET | Freq: Three times a day (TID) | ORAL | Status: DC | PRN

## 2014-01-31 MED ORDER — HYDROCODONE-ACETAMINOPHEN 5-325 MG PO TABS: ORAL_TABLET | ORAL | Status: DC

## 2014-01-31 MED ORDER — FENTANYL CITRATE 0.05 MG/ML IJ SOLN
50.0000 ug | Freq: Once | INTRAMUSCULAR | Status: AC
  Administered 2014-01-31: 50 ug via INTRAVENOUS
  Filled 2014-01-31: qty 2

## 2014-01-31 NOTE — ED Provider Notes (Signed)
CSN: [id number]     Arrival date & time 01/30/14  2359 History   First MD Initiated Contact with Patient 01/31/14 0227     Chief Complaint  Patient presents with  . Flank Pain     (Consider location/radiation/quality/duration/timing/severity/associated sxs/prior Treatment) HPI Comments: Patient with history of abdominal hysterectomy, negative colonoscopy 2 years ago -- presents with one week of worsening left lower lower quadrant and left lateral abdominal pain over the past one week. Patient describes the pain as sharp. Today she was very uncomfortable due to the pain and decided to come to the emergency department. She's been taking over-the-counter medications without relief. She has had nausea but no vomiting. She denies diarrhea or blood in her stool. Patient has noted increased dysuria and frequency. No vaginal bleeding or discharge. Patient was diagnosed with possible small bowel obstruction versus enteritis and was admitted to the hospital in May 2015. She has a history of gallstones but no cholecystectomy. The onset of this condition was acute. The course is constant. Aggravating factors: none. Alleviating factors: none. No history of kidney stones.   Patient is a 53 y.o. female presenting with flank pain. The history is provided by the patient.  Flank Pain Associated symptoms include abdominal pain and nausea. Pertinent negatives include no chest pain, coughing, fever, headaches, myalgias, rash, sore throat or vomiting.    Past Medical History  Diagnosis Date  . GERD (gastroesophageal reflux disease)    Past Surgical History  Procedure Laterality Date  . Abdominal hysterectomy     Family History  Problem Relation Age of Onset  . Depression Sister    History  Substance Use Topics  . Smoking status: Current Every Day Smoker -- 0.50 packs/day for 30 years    Types: Cigarettes  . Smokeless tobacco: Not on file  . Alcohol Use: No   OB History   Grav Para Term Preterm  Abortions TAB SAB Ect Mult Living                 Review of Systems  Constitutional: Negative for fever.  HENT: Negative for rhinorrhea and sore throat.   Eyes: Negative for redness.  Respiratory: Negative for cough.   Cardiovascular: Negative for chest pain.  Gastrointestinal: Positive for nausea and abdominal pain. Negative for vomiting and diarrhea.  Genitourinary: Positive for dysuria, frequency and flank pain.  Musculoskeletal: Negative for myalgias.  Skin: Negative for rash.  Neurological: Negative for headaches.    Allergies  Penicillins  Home Medications   Prior to Admission medications   Medication Sig Start Date End Date Taking? Authorizing Provider  calcium carbonate (TUMS - DOSED IN MG ELEMENTAL CALCIUM) 500 MG chewable tablet Chew 2 tablets by mouth as needed for indigestion or heartburn (indigestion).    Historical Provider, MD  dexlansoprazole (DEXILANT) 60 MG capsule Take by mouth. 11/10/13 11/10/14  Historical Provider, MD  PARoxetine (PAXIL) 40 MG tablet TAKE 1 TABLET BY MOUTH EVERY MORNING 11/28/13   Kathlee Nations, MD   BP 140/74  Pulse 62  Temp(Src) 97.9 F (36.6 C) (Oral)  Resp 16  Ht 5\' 4"  (1.626 m)  Wt 176 lb (79.833 kg)  BMI 30.20 kg/m2  SpO2 98% Physical Exam  Nursing note and vitals reviewed. Constitutional: She appears well-developed and well-nourished.  HENT:  Head: Normocephalic and atraumatic.  Eyes: Conjunctivae are normal. Right eye exhibits no discharge. Left eye exhibits no discharge.  Neck: Normal range of motion. Neck supple.  Cardiovascular: Normal rate, regular rhythm and normal  heart sounds.   Pulmonary/Chest: Effort normal and breath sounds normal.  Abdominal: Soft. There is tenderness. There is no rigidity, no rebound, no guarding, no CVA tenderness, no tenderness at McBurney's point and negative Murphy's sign.    Neurological: She is alert.  Skin: Skin is warm and dry.  Psychiatric: She has a normal mood and affect.    ED  Course  Procedures (including critical care time) Labs Review Labs Reviewed  URINALYSIS, ROUTINE W REFLEX MICROSCOPIC - Abnormal; Notable for the following:    APPearance CLOUDY (*)    Leukocytes, UA TRACE (*)    All other components within normal limits  URINE MICROSCOPIC-ADD ON - Abnormal; Notable for the following:    Squamous Epithelial / LPF FEW (*)    Bacteria, UA FEW (*)    All other components within normal limits  I-STAT CHEM 8, ED - Abnormal; Notable for the following:    Glucose, Bld 105 (*)    Calcium, Ion 1.08 (*)    All other components within normal limits  CBC WITH DIFFERENTIAL    Imaging Review Ct Abdomen Pelvis W Contrast  01/31/2014   CLINICAL DATA:  Left flank pain for 3 days, radiating into the left lower quadrant. Nausea and dysuria.  EXAM: CT ABDOMEN AND PELVIS WITH CONTRAST  TECHNIQUE: Multidetector CT imaging of the abdomen and pelvis was performed using the standard protocol following bolus administration of intravenous contrast.  CONTRAST:  100 mL of Omnipaque 300 IV contrast  COMPARISON:  Abdominal radiograph performed 10/01/2013, and CT of the abdomen and pelvis performed 09/30/2013  FINDINGS: Minimal bibasilar atelectasis is noted.  The liver and spleen are unremarkable in appearance. Several stones are seen in the gallbladder. The gallbladder is otherwise unremarkable. The pancreas and adrenal glands are unremarkable.  The kidneys are unremarkable in appearance. There is no evidence of hydronephrosis. No renal or ureteral stones are seen. No perinephric stranding is appreciated.  No free fluid is identified. The small bowel is unremarkable in appearance. The stomach is within normal limits. No acute vascular abnormalities are seen. Scattered calcification is seen along the abdominal aorta and its branches.  The appendix is normal in caliber, without evidence for appendicitis. The colon is unremarkable in appearance.  The bladder is moderately distended and grossly  unremarkable. The patient is status post hysterectomy. No suspicious adnexal No inguinal lymphadenopathy is seen.  No acute osseous abnormalities are identified. Facet disease is noted at the lower lumbar spine.  IMPRESSION: 1. No acute abnormality seen within the abdomen or pelvis. 2. Cholelithiasis noted.  Gallbladder otherwise unremarkable. 3. Scattered calcification along the abdominal aorta and its branches.   Electronically Signed   By: Garald Balding M.D.   On: 01/31/2014 04:42     EKG Interpretation None      3:05 AM Patient seen and examined. Work-up initiated. Pain currently controlled. CT pending. Patient informed of lab results.   Vital signs reviewed and are as follows: BP 140/74  Pulse 62  Temp(Src) 97.9 F (36.6 C) (Oral)  Resp 16  Ht 5\' 4"  (1.626 m)  Wt 176 lb (79.833 kg)  BMI 30.20 kg/m2  SpO2 98%  6:21 AM CT neg. Patient better with pain medication. Mild lateral tenderness on exam at current time.   Patient labs and CT discussed with Dr. Sharol Given. No further eval indicated at this point.   Will d/c to home with symptomatic treatment including Vicodin for pain, Zofran for nausea, and Bentyl for abdominal spasm. Patient counseled  on use of narcotic pain medications. Counseled not to combine these medications with others containing tylenol. Urged not to drink alcohol, drive, or perform any other activities that requires focus while taking these medications. The patient verbalizes understanding and agrees with the plan.   Patient has a primary care physician and she is encouraged to followup with them early this coming week. She verbalizes that she can do this.  The patient was urged to return to the Emergency Department immediately with worsening of current symptoms, worsening abdominal pain, persistent vomiting, blood noted in stools, fever, or any other concerns. The patient verbalized understanding.     MDM   Final diagnoses:  Flank pain   Patient with left  abdominal and flank pain. Workup is largely negative. CT scan does not demonstrate etiology of pain. Patient improved in emergency department with treatment. She has PCP and followup which she can obtain early next week. We discussed appropriate return instructions. Vitals are stable, no fever.  No signs of dehydration, tolerating PO's. Lungs are clear. No concern for appendicitis, cholecystitis, pancreatitis, ruptured viscus, UTI, kidney stone, or any other emergent abdominal etiology at this time. No risk factors for mesenteric ischemia. Supportive therapy indicated with return if symptoms worsen. Patient counseled.    Carlisle Cater, PA-C 01/31/14 [phone]

## 2014-01-31 NOTE — ED Notes (Signed)
Pt c/o L flank pain x 3-4 days with radiating to LLQ. +nausea. Dysuria x 2-3 days.

## 2014-01-31 NOTE — Discharge Instructions (Signed)
Please read and follow all provided instructions.  Your diagnoses today include:  1. Flank pain     Tests performed today include:  Blood counts and electrolytes  Blood tests to check liver and kidney function  Blood tests to check pancreas function  Urine test to look for infection  CT scan - does not show any infections, blockages, or other problems.   Vital signs. See below for your results today.   Medications prescribed:   Vicodin (hydrocodone/acetaminophen) - narcotic pain medication  DO NOT drive or perform any activities that require you to be awake and alert because this medicine can make you drowsy. BE VERY CAREFUL not to take multiple medicines containing Tylenol (also called acetaminophen). Doing so can lead to an overdose which can damage your liver and cause liver failure and possibly death.   Zofran (ondansetron) - for nausea and vomiting   Bentyl - medication for abdominal spasms  Take any prescribed medications only as directed.  Home care instructions:   Follow any educational materials contained in this packet.  Follow-up instructions: Please follow-up with your primary care provider in the next 3 days for further evaluation of your symptoms.    Return instructions:  SEEK IMMEDIATE MEDICAL ATTENTION IF:  The pain does not go away or becomes severe   A temperature above 101F develops   Repeated vomiting occurs (multiple episodes)   The pain becomes localized to portions of the abdomen. The right side could possibly be appendicitis. In an adult, the left lower portion of the abdomen could be colitis or diverticulitis.   Blood is being passed in stools or vomit (bright red or black tarry stools)   You develop chest pain, difficulty breathing, dizziness or fainting, or become confused, poorly responsive, or inconsolable (young children)  If you have any other emergent concerns regarding your health  Additional Information: Abdominal (belly)  pain can be caused by many things. Your caregiver performed an examination and possibly ordered blood/urine tests and imaging (CT scan, x-rays, ultrasound). Many cases can be observed and treated at home after initial evaluation in the emergency department. Even though you are being discharged home, abdominal pain can be unpredictable. Therefore, you need a repeated exam if your pain does not resolve, returns, or worsens. Most patients with abdominal pain don't have to be admitted to the hospital or have surgery, but serious problems like appendicitis and gallbladder attacks can start out as nonspecific pain. Many abdominal conditions cannot be diagnosed in one visit, so follow-up evaluations are very important.  Your vital signs today were: BP 140/74   Pulse 62   Temp(Src) 97.9 F (36.6 C) (Oral)   Resp 16   Ht 5\' 4"  (1.626 m)   Wt 176 lb (79.833 kg)   BMI 30.20 kg/m2   SpO2 98% If your blood pressure (bp) was elevated above 135/85 this visit, please have this repeated by your doctor within one month. --------------

## 2014-01-31 NOTE — ED Provider Notes (Signed)
Medical screening examination/treatment/procedure(s) were performed by non-physician practitioner and as supervising physician I was immediately available for consultation/collaboration.   EKG Interpretation None       Kalman Drape, MD 01/31/14 [phone]

## 2014-02-01 ENCOUNTER — Encounter (HOSPITAL_COMMUNITY): Payer: Self-pay | Admitting: Emergency Medicine

## 2014-02-01 ENCOUNTER — Emergency Department (HOSPITAL_COMMUNITY)
Admission: EM | Admit: 2014-02-01 | Discharge: 2014-02-01 | Disposition: A | Discharge status: Home / Self Care | Payer: Managed Care, Other (non HMO) | Attending: Emergency Medicine | Admitting: Emergency Medicine

## 2014-02-01 DIAGNOSIS — Z880 Allergy status to penicillin: Secondary | ICD-10-CM | POA: Diagnosis not present

## 2014-02-01 DIAGNOSIS — Z79899 Other long term (current) drug therapy: Secondary | ICD-10-CM | POA: Diagnosis not present

## 2014-02-01 DIAGNOSIS — Z90710 Acquired absence of both cervix and uterus: Secondary | ICD-10-CM | POA: Diagnosis not present

## 2014-02-01 DIAGNOSIS — K219 Gastro-esophageal reflux disease without esophagitis: Secondary | ICD-10-CM | POA: Diagnosis not present

## 2014-02-01 DIAGNOSIS — R109 Unspecified abdominal pain: Secondary | ICD-10-CM | POA: Diagnosis not present

## 2014-02-01 DIAGNOSIS — F17200 Nicotine dependence, unspecified, uncomplicated: Secondary | ICD-10-CM | POA: Diagnosis not present

## 2014-02-01 LAB — URINALYSIS, ROUTINE W REFLEX MICROSCOPIC
Bilirubin Urine: NEGATIVE
GLUCOSE, UA: NEGATIVE mg/dL
Hgb urine dipstick: NEGATIVE
Ketones, ur: NEGATIVE mg/dL
Leukocytes, UA: NEGATIVE
NITRITE: NEGATIVE
PROTEIN: NEGATIVE mg/dL
Specific Gravity, Urine: 1.02 (ref 1.005–1.030)
Urobilinogen, UA: 1 mg/dL (ref 0.0–1.0)
pH: 5.5 (ref 5.0–8.0)

## 2014-02-01 MED ORDER — DIAZEPAM 5 MG PO TABS
5.0000 mg | ORAL_TABLET | Freq: Once | ORAL | Status: AC
  Administered 2014-02-01: 5 mg via ORAL
  Filled 2014-02-01: qty 1

## 2014-02-01 MED ORDER — OXYCODONE-ACETAMINOPHEN 5-325 MG PO TABS
1.0000 | ORAL_TABLET | Freq: Once | ORAL | Status: AC
Start: 2014-02-01 — End: 2014-02-01
  Administered 2014-02-01: 1 via ORAL
  Filled 2014-02-01: qty 1

## 2014-02-01 MED ORDER — OXYCODONE-ACETAMINOPHEN 5-325 MG PO TABS: 1.0000 | ORAL_TABLET | Freq: Once | ORAL | Status: DC

## 2014-02-01 MED ORDER — DIAZEPAM 5 MG PO TABS: 5.0000 mg | ORAL_TABLET | ORAL | Status: DC | PRN

## 2014-02-01 NOTE — ED Notes (Signed)
Pt reports left flank pain that radiates to LL abdomen and back, sts was seen for same on Friday

## 2014-02-01 NOTE — ED Provider Notes (Signed)
CSN: [id number]     Arrival date & time 02/01/14  1013 History   First MD Initiated Contact with Patient 02/01/14 1045     Chief Complaint  Patient presents with  . Flank Pain     (Consider location/radiation/quality/duration/timing/severity/associated sxs/prior Treatment) HPI Comments: Patient here with persistent left flank pain x2 days. Seen here at that time and old records reviewed which showed that the patient had negative workup including an abdominal CAT scan and urinalysis. Pain characterized as sharp and located at her left flank with slight radiation to her left lower quadrant. Denies any black or bloody stools. No fever chills vomiting diarrhea. Denies any urinary symptoms. Symptoms worse with movement and certain positions better with remaining still. Has been using her hydrocodone without relief  Patient is a 53 y.o. female presenting with flank pain. The history is provided by the patient.  Flank Pain    Past Medical History  Diagnosis Date  . GERD (gastroesophageal reflux disease)    Past Surgical History  Procedure Laterality Date  . Abdominal hysterectomy     Family History  Problem Relation Age of Onset  . Depression Sister    History  Substance Use Topics  . Smoking status: Current Every Day Smoker -- 0.50 packs/day for 30 years    Types: Cigarettes  . Smokeless tobacco: Not on file  . Alcohol Use: No   OB History   Grav Para Term Preterm Abortions TAB SAB Ect Mult Living                 Review of Systems  Genitourinary: Positive for flank pain.  All other systems reviewed and are negative.     Allergies  Penicillins  Home Medications   Prior to Admission medications   Medication Sig Start Date End Date Taking? Authorizing Provider  calcium carbonate (TUMS - DOSED IN MG ELEMENTAL CALCIUM) 500 MG chewable tablet Chew 2 tablets by mouth as needed for indigestion or heartburn (indigestion).   Yes Historical Provider, MD  dexlansoprazole  (DEXILANT) 60 MG capsule Take 60 mg by mouth daily.  11/10/13 11/10/14 Yes Historical Provider, MD  dicyclomine (BENTYL) 20 MG tablet Take 1 tablet (20 mg total) by mouth 2 (two) times daily. 01/31/14  Yes Carlisle Cater, PA-C  HYDROcodone-acetaminophen (NORCO/VICODIN) 5-325 MG per tablet Take 1-2 tablets every 6 hours as needed for severe pain 01/31/14  Yes Carlisle Cater, PA-C  ondansetron (ZOFRAN ODT) 4 MG disintegrating tablet Take 1 tablet (4 mg total) by mouth every 8 (eight) hours as needed for nausea or vomiting. 01/31/14  Yes Carlisle Cater, PA-C  PARoxetine (PAXIL) 40 MG tablet Take 40 mg by mouth every morning.   Yes Historical Provider, MD   BP 101/59  Pulse 56  Temp(Src) 98.3 F (36.8 C) (Oral)  Resp 16  SpO2 96% Physical Exam  Nursing note and vitals reviewed. Constitutional: She is oriented to person, place, and time. She appears well-developed and well-nourished.  Non-toxic appearance. No distress.  HENT:  Head: Normocephalic and atraumatic.  Eyes: Conjunctivae, EOM and lids are normal. Pupils are equal, round, and reactive to light.  Neck: Normal range of motion. Neck supple. No tracheal deviation present. No mass present.  Cardiovascular: Normal rate, regular rhythm and normal heart sounds.  Exam reveals no gallop.   No murmur heard. Pulmonary/Chest: Effort normal and breath sounds normal. No stridor. No respiratory distress. She has no decreased breath sounds. She has no wheezes. She has no rhonchi. She has no rales.  Abdominal: Soft. Normal appearance and bowel sounds are normal. She exhibits no distension. There is no tenderness. There is no rigidity, no rebound, no guarding and no CVA tenderness. No hernia. Hernia confirmed negative in the ventral area.    Musculoskeletal: Normal range of motion. She exhibits no edema and no tenderness.  Neurological: She is alert and oriented to person, place, and time. She has normal strength. No cranial nerve deficit or sensory deficit. GCS  eye subscore is 4. GCS verbal subscore is 5. GCS motor subscore is 6.  Skin: Skin is warm and dry. No abrasion and no rash noted.  Psychiatric: She has a normal mood and affect. Her speech is normal and behavior is normal.    ED Course  Procedures (including critical care time) Labs Review Labs Reviewed  URINE CULTURE  URINALYSIS, ROUTINE W REFLEX MICROSCOPIC    Imaging Review Ct Abdomen Pelvis W Contrast  01/31/2014   CLINICAL DATA:  Left flank pain for 3 days, radiating into the left lower quadrant. Nausea and dysuria.  EXAM: CT ABDOMEN AND PELVIS WITH CONTRAST  TECHNIQUE: Multidetector CT imaging of the abdomen and pelvis was performed using the standard protocol following bolus administration of intravenous contrast.  CONTRAST:  100 mL of Omnipaque 300 IV contrast  COMPARISON:  Abdominal radiograph performed 10/01/2013, and CT of the abdomen and pelvis performed 09/30/2013  FINDINGS: Minimal bibasilar atelectasis is noted.  The liver and spleen are unremarkable in appearance. Several stones are seen in the gallbladder. The gallbladder is otherwise unremarkable. The pancreas and adrenal glands are unremarkable.  The kidneys are unremarkable in appearance. There is no evidence of hydronephrosis. No renal or ureteral stones are seen. No perinephric stranding is appreciated.  No free fluid is identified. The small bowel is unremarkable in appearance. The stomach is within normal limits. No acute vascular abnormalities are seen. Scattered calcification is seen along the abdominal aorta and its branches.  The appendix is normal in caliber, without evidence for appendicitis. The colon is unremarkable in appearance.  The bladder is moderately distended and grossly unremarkable. The patient is status post hysterectomy. No suspicious adnexal No inguinal lymphadenopathy is seen.  No acute osseous abnormalities are identified. Facet disease is noted at the lower lumbar spine.  IMPRESSION: 1. No acute  abnormality seen within the abdomen or pelvis. 2. Cholelithiasis noted.  Gallbladder otherwise unremarkable. 3. Scattered calcification along the abdominal aorta and its branches.   Electronically Signed   By: Garald Balding M.D.   On: 01/31/2014 04:42     EKG Interpretation None      MDM   Final diagnoses:  None    Patient given pain meds here feels better. Urinalysis negative. Stable for discharge.    Leota Jacobsen, MD 02/01/14 [phone]

## 2014-02-01 NOTE — ED Notes (Signed)
md at bedside  Pt alert and oriented x4. Respirations even and unlabored, bilateral symmetrical rise and fall of chest. Skin warm and dry. In no acute distress. Denies needs.   

## 2014-02-01 NOTE — Discharge Instructions (Signed)
Flank Pain °Flank pain refers to pain that is located on the side of the body between the upper abdomen and the back. The pain may occur over a short period of time (acute) or may be long-term or reoccurring (chronic). It may be mild or severe. Flank pain can be caused by many things. °CAUSES  °Some of the more common causes of flank pain include: °· Muscle strains.   °· Muscle spasms.   °· A disease of your spine (vertebral disk disease).   °· A lung infection (pneumonia).   °· Fluid around your lungs (pulmonary edema).   °· A kidney infection.   °· Kidney stones.   °· A very painful skin rash caused by the chickenpox virus (shingles).   °· Gallbladder disease.   °HOME CARE INSTRUCTIONS  °Home care will depend on the cause of your pain. In general, °· Rest as directed by your caregiver. °· Drink enough fluids to keep your urine clear or pale yellow. °· Only take over-the-counter or prescription medicines as directed by your caregiver. Some medicines may help relieve the pain. °· Tell your caregiver about any changes in your pain. °· Follow up with your caregiver as directed. °SEEK IMMEDIATE MEDICAL CARE IF:  °· Your pain is not controlled with medicine.   °· You have new or worsening symptoms. °· Your pain increases.   °· You have abdominal pain.   °· You have shortness of breath.   °· You have persistent nausea or vomiting.   °· You have swelling in your abdomen.   °· You feel faint or pass out.   °· You have blood in your urine. °· You have a fever or persistent symptoms for more than 2-3 days. °· You have a fever and your symptoms suddenly get worse. °MAKE SURE YOU:  °· Understand these instructions. °· Will watch your condition. °· Will get help right away if you are not doing well or get worse. °Document Released: 06/29/2005 Document Revised: 01/31/2012 Document Reviewed: 12/21/2011 °Exit[employer]® Patient Information ©2015 [employer]. This information is not intended to replace advice given to you by your  health care provider. Make sure you discuss any questions you have with your health care provider. ° °

## 2014-02-01 NOTE — ED Notes (Signed)
Pt escorted to discharge window. Pt verbalized understanding discharge instructions. In no acute distress.  

## 2014-02-03 DIAGNOSIS — K219 Gastro-esophageal reflux disease without esophagitis: Secondary | ICD-10-CM | POA: Insufficient documentation

## 2014-02-03 LAB — URINE CULTURE
Colony Count: NO GROWTH
Culture: NO GROWTH

## 2014-02-27 ENCOUNTER — Ambulatory Visit (INDEPENDENT_AMBULATORY_CARE_PROVIDER_SITE_OTHER): Payer: 59 | Admitting: Psychiatry

## 2014-02-27 ENCOUNTER — Encounter (HOSPITAL_COMMUNITY): Payer: Self-pay | Admitting: Psychiatry

## 2014-02-27 DIAGNOSIS — F329 Major depressive disorder, single episode, unspecified: Secondary | ICD-10-CM

## 2014-02-27 DIAGNOSIS — F32A Depression, unspecified: Secondary | ICD-10-CM

## 2014-02-27 MED ORDER — TRAZODONE HCL 50 MG PO TABS: ORAL_TABLET | ORAL | Status: DC

## 2014-02-27 MED ORDER — PAROXETINE HCL 40 MG PO TABS: 40.0000 mg | ORAL_TABLET | ORAL | Status: DC

## 2014-02-27 NOTE — Progress Notes (Signed)
[facility] Progress Note  Melissa Case [id number] 53 y.o.  02/27/2014 10:33 AM  Chief Complaint: Medication management and followup.  History of Present Illness: Melissa Case came for her followup appointment .  She is complaining of poor sleep in the past few weeks.  She told job is very tiring and sometimes she could not sleep at night.  However overall her depression is under control.  She denies any irritability, crying spells, anger or any feeling of hopelessness or worthlessness.  She was recently seen in the emergency room because of abdominal pain and she was given narcotic pain medication and Valium.  She finished and now her abdominal pain is under control.  She wants to try trazodone again which helped in the past but it was given in low dose.  She denies any side effects of medication.  She continues to have chronic issues with her family but denies any recent major problem .  Her appetite is okay.  She has no tremors or shakes.  She is now drinking or using any illegal substances.    Suicidal Ideation: No Plan Formed: No Patient has means to carry out plan: No  Homicidal Ideation: No Plan Formed: No Patient has means to carry out plan: No  Review of Systems: Psychiatric: Agitation: No Hallucination: No Depressed Mood: No Insomnia: No Hypersomnia: No Altered Concentration: No Feels Worthless: No Grandiose Ideas: No Belief In Special Powers: No New/Increased Substance Abuse: No Compulsions: No  Neurologic: Headache: No Seizure: No Paresthesias: No  Medical History:  Patient has a history of acid reflux and barrett esophagus .  Her primary care physician is Eldridge Abrahams and she see Dr. Tiffany Kocher at [facility] at [facility] for her GI issues.    Psychosocial history. Patient lives with her husband and her father.  She has 5 children.  She has given up one daughter for adoption.  She has limited contact with her daughter.  She is very close  to her son however most of her children are out of town.  She works and like her job.   Outpatient Encounter Prescriptions as of 02/27/2014  Medication Sig  . calcium carbonate (TUMS - DOSED IN MG ELEMENTAL CALCIUM) 500 MG chewable tablet Chew 2 tablets by mouth as needed for indigestion or heartburn (indigestion).  Marland Kitchen dexlansoprazole (DEXILANT) 60 MG capsule Take 60 mg by mouth daily.   Marland Kitchen dicyclomine (BENTYL) 20 MG tablet Take 1 tablet (20 mg total) by mouth 2 (two) times daily.  Marland Kitchen PARoxetine (PAXIL) 40 MG tablet Take 1 tablet (40 mg total) by mouth every morning.  . traZODone (DESYREL) 50 MG tablet Take 1/2 to 1 tab at bed time  . [DISCONTINUED] diazepam (VALIUM) 5 MG tablet Take 1 tablet (5 mg total) by mouth every 4 (four) hours as needed for muscle spasms.  . [DISCONTINUED] HYDROcodone-acetaminophen (NORCO/VICODIN) 5-325 MG per tablet Take 1-2 tablets every 6 hours as needed for severe pain  . [DISCONTINUED] ondansetron (ZOFRAN ODT) 4 MG disintegrating tablet Take 1 tablet (4 mg total) by mouth every 8 (eight) hours as needed for nausea or vomiting.  . [DISCONTINUED] oxyCODONE-acetaminophen (PERCOCET/ROXICET) 5-325 MG per tablet Take 1-2 tablets by mouth once.  . [DISCONTINUED] PARoxetine (PAXIL) 40 MG tablet Take 40 mg by mouth every morning.    Past Psychiatric History/Hospitalization(s): Anxiety: Yes Bipolar Disorder: No Depression: Yes Mania: No Psychosis: No Schizophrenia: No Personality Disorder: No Hospitalization for psychiatric illness: Yes History of Electroconvulsive Shock Therapy: No Prior Suicide Attempts:  No  Physical Exam: Constitutional:  There were no vitals taken for this visit.  General Appearance: alert, oriented, no acute distress and well nourished  Musculoskeletal: Strength & Muscle Tone: within normal limits Gait & Station: normal Patient leans: N/A  Psychiatric: Speech (describe rate, volume, coherence, spontaneity, and abnormalities if any): Clear  and coherent  Thought Process (describe rate, content, abstract reasoning, and computation): Logical and goal-directed  Associations: Coherent and Intact  Thoughts: normal  Mental Status: Orientation: oriented to person, place, time/date and situation Mood & Affect: anxiety and Tired Attention Span & Concentration: Fair  Established Problem, Stable/Improving (1), Review of Psycho-Social Stressors (1), Review of Last Therapy Session (1), Review of Medication Regimen & Side Effects (2) and Review of New Medication or Change in Dosage (2)  Assessment: Axis I: Depressive disorder NOS  Axis II: Deferred  Axis III:  Patient Active Problem List   Diagnosis Date Noted  . Abdominal pain 09/30/2013  . SBO (small bowel obstruction) 09/30/2013  . 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 Gall stone 04/05/2012  . Depression 09/15/2011   Axis IV: Mild  Axis V: 55-75   Plan:  Continue Paxil 40 mg daily.  I will add trazodone 50 mg half to one tablet at bedtime to help insomnia.  Patient has taken trazodone in the past with good response.  Recommended to call us back if she has any question or any concern.  I will see her again in 3 months.   Genifer Lazenby T., MD 02/27/2014

## 2014-05-29 ENCOUNTER — Ambulatory Visit (HOSPITAL_COMMUNITY): Payer: Self-pay | Admitting: Psychiatry

## 2014-06-19 ENCOUNTER — Ambulatory Visit (INDEPENDENT_AMBULATORY_CARE_PROVIDER_SITE_OTHER): Payer: 59 | Admitting: Psychiatry

## 2014-06-19 ENCOUNTER — Encounter (HOSPITAL_COMMUNITY): Payer: Self-pay | Admitting: Psychiatry

## 2014-06-19 VITALS — BP 112/64 | HR 65 | Ht 64.0 in | Wt 178.6 lb

## 2014-06-19 DIAGNOSIS — F329 Major depressive disorder, single episode, unspecified: Secondary | ICD-10-CM

## 2014-06-19 DIAGNOSIS — F32A Depression, unspecified: Secondary | ICD-10-CM

## 2014-06-19 MED ORDER — PAROXETINE HCL 40 MG PO TABS: 40.0000 mg | ORAL_TABLET | ORAL | Status: DC

## 2014-06-19 NOTE — Progress Notes (Signed)
[facility] [id number] Progress Note  Melissa Case [id number] 54 y.o.  06/19/2014 10:07 AM  Chief Complaint: Medication management and followup.  History of Present Illness: Melissa Case came for her followup appointment .  She had a very good Christmas.  She was able to spend time with the grandkids and the family members.  She sleeping better and she does not require trazodone.  She had a prescription just in case if she needed in the future.  Her anxiety and depression is under control.  Her job is going very well.  Patient denies any irritability, crying spells, any feeling of hopelessness or worthlessness.  Her energy level is good.  Her appetite is okay.  Her vitals are stable.  She is taking Paxil as prescribed and denies any side effects.  Patient denies drinking or using any illegal substances.  She lives with her husband who is very supportive.  Suicidal Ideation: No Plan Formed: No Patient has means to carry out plan: No  Homicidal Ideation: No Plan Formed: No Patient has means to carry out plan: No  Review of Systems: Psychiatric: Agitation: No Hallucination: No Depressed Mood: No Insomnia: No Hypersomnia: No Altered Concentration: No Feels Worthless: No Grandiose Ideas: No Belief In Special Powers: No New/Increased Substance Abuse: No Compulsions: No  Neurologic: Headache: No Seizure: No Paresthesias: No  Medical History:  Patient has a history of acid reflux and barrett esophagus .  Her primary care physician is Melissa Case and she see Dr. Tiffany Case at [facility] at [facility] for her GI issues.    Outpatient Encounter Prescriptions as of 06/19/2014  Medication Sig  . calcium carbonate (TUMS - DOSED IN MG ELEMENTAL CALCIUM) 500 MG chewable tablet Chew 2 tablets by mouth as needed for indigestion or heartburn (indigestion).  Marland Kitchen dexlansoprazole (DEXILANT) 60 MG capsule Take 60 mg by mouth daily.   Marland Kitchen dicyclomine (BENTYL) 20 MG tablet Take 1 tablet  (20 mg total) by mouth 2 (two) times daily. (Patient taking differently: Take 20 mg by mouth 2 (two) times daily as needed. )  . PARoxetine (PAXIL) 40 MG tablet Take 1 tablet (40 mg total) by mouth every morning.  . [DISCONTINUED] PARoxetine (PAXIL) 40 MG tablet Take 1 tablet (40 mg total) by mouth every morning.  . traZODone (DESYREL) 50 MG tablet Take 1/2 to 1 tab at bed time (Patient not taking: Reported on 06/19/2014)    Past Psychiatric History/Hospitalization(s): Anxiety: Yes Bipolar Disorder: No Depression: Yes Mania: No Psychosis: No Schizophrenia: No Personality Disorder: No Hospitalization for psychiatric illness: Yes History of Electroconvulsive Shock Therapy: No Prior Suicide Attempts: No  Physical Exam: Constitutional:  BP 112/64 mmHg  Pulse 65  Ht 5\' 4"  (1.626 m)  Wt 178 lb 9.6 oz (81.012 kg)  BMI 30.64 kg/m2  General Appearance: alert, oriented, no acute distress and well nourished  Musculoskeletal: Strength & Muscle Tone: within normal limits Gait & Station: normal Patient leans: N/A  Psychiatric: Speech (describe rate, volume, coherence, spontaneity, and abnormalities if any): Clear and coherent  Thought Process (describe rate, content, abstract reasoning, and computation): Logical and goal-directed  Associations: Coherent and Intact  Thoughts: normal  Mental Status: Orientation: oriented to person, place, time/date and situation Mood & Affect: anxiety Attention Span & Concentration: Fair  Established Problem, Stable/Improving (1), Review of Last Therapy Session (1) and Review of Medication Regimen & Side Effects (2)  Assessment: Axis I: Depressive disorder NOS  Axis II: Deferred  Axis III:  Patient Active Problem List  Diagnosis Date Noted  . Abdominal pain 09/30/2013  . SBO (small bowel obstruction) 09/30/2013  . 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 Gall stone 04/05/2012  . Depression 09/15/2011    Plan:  Patient is doing better on Paxil  40 mg daily.  She has trazodone prescription however she has not used it but she wants to keep if needed.  Discussed medication side effects and benefits.  Recommended to call us back if she has any question or any concern.  I will see her again in 3 months.   Melissa Case T., MD 06/19/2014

## 2014-09-12 ENCOUNTER — Emergency Department (HOSPITAL_COMMUNITY)
Admission: EM | Admit: 2014-09-12 | Discharge: 2014-09-12 | Disposition: A | Discharge status: Home / Self Care | Payer: Managed Care, Other (non HMO) | Source: Home / Self Care | Attending: Family Medicine | Admitting: Family Medicine

## 2014-09-12 ENCOUNTER — Encounter (HOSPITAL_COMMUNITY): Payer: Self-pay | Admitting: Emergency Medicine

## 2014-09-12 DIAGNOSIS — L0291 Cutaneous abscess, unspecified: Secondary | ICD-10-CM | POA: Diagnosis not present

## 2014-09-12 MED ORDER — SULFAMETHOXAZOLE-TRIMETHOPRIM 800-160 MG PO TABS: 2.0000 | ORAL_TABLET | Freq: Two times a day (BID) | ORAL | Status: DC

## 2014-09-12 NOTE — Discharge Instructions (Signed)
Thank you for coming in today. Return if not better or worse.  Abscess An abscess is an infected area that contains a collection of pus and debris.It can occur in almost any part of the body. An abscess is also known as a furuncle or boil. CAUSES  An abscess occurs when tissue gets infected. This can occur from blockage of oil or sweat glands, infection of hair follicles, or a minor injury to the skin. As the body tries to fight the infection, pus collects in the area and creates pressure under the skin. This pressure causes pain. People with weakened immune systems have difficulty fighting infections and get certain abscesses more often.  SYMPTOMS Usually an abscess develops on the skin and becomes a painful mass that is red, warm, and tender. If the abscess forms under the skin, you may feel a moveable soft area under the skin. Some abscesses break open (rupture) on their own, but most will continue to get worse without care. The infection can spread deeper into the body and eventually into the bloodstream, causing you to feel ill.  DIAGNOSIS  Your caregiver will take your medical history and perform a physical exam. A sample of fluid may also be taken from the abscess to determine what is causing your infection. TREATMENT  Your caregiver may prescribe antibiotic medicines to fight the infection. However, taking antibiotics alone usually does not cure an abscess. Your caregiver may need to make a small cut (incision) in the abscess to drain the pus. In some cases, gauze is packed into the abscess to reduce pain and to continue draining the area. HOME CARE INSTRUCTIONS   Only take over-the-counter or prescription medicines for pain, discomfort, or fever as directed by your caregiver.  If you were prescribed antibiotics, take them as directed. Finish them even if you start to feel better.  If gauze is used, follow your caregiver's directions for changing the gauze.  To avoid spreading the  infection:  Keep your draining abscess covered with a bandage.  Wash your hands well.  Do not share personal care items, towels, or whirlpools with others.  Avoid skin contact with others.  Keep your skin and clothes clean around the abscess.  Keep all follow-up appointments as directed by your caregiver. SEEK MEDICAL CARE IF:   You have increased pain, swelling, redness, fluid drainage, or bleeding.  You have muscle aches, chills, or a general ill feeling.  You have a fever. MAKE SURE YOU:   Understand these instructions.  Will watch your condition.  Will get help right away if you are not doing well or get worse. Document Released: 02/15/2005 Document Revised: 11/07/2011 Document Reviewed: 07/21/2011 [facility] Patient Information 2015 [place], [place]. This information is not intended to replace advice given to you by your health care provider. Make sure you discuss any questions you have with your health care provider.

## 2014-09-12 NOTE — ED Notes (Signed)
C/o boil behind right ear States she is not having any dental pain States area is tender to touch  Pain is radiating down to neck Neosporin was used as tx

## 2014-09-12 NOTE — ED Provider Notes (Signed)
Melissa Case is a 54 y.o. female who presents to Urgent Care today for boil. Patient has a tender swollen area high in her right ear. This is been present for one night. She denies any injury fevers or chills nausea vomiting or diarrhea.   Past Medical History  Diagnosis Date  . GERD (gastroesophageal reflux disease)   . Depression    Past Surgical History  Procedure Laterality Date  . Abdominal hysterectomy     History  Substance Use Topics  . Smoking status: Current Every Day Smoker -- 0.50 packs/day for 30 years    Types: Cigarettes  . Smokeless tobacco: Never Used  . Alcohol Use: No   ROS as above Medications: No current facility-administered medications for this encounter.   Current Outpatient Prescriptions  Medication Sig Dispense Refill  . calcium carbonate (TUMS - DOSED IN MG ELEMENTAL CALCIUM) 500 MG chewable tablet Chew 2 tablets by mouth as needed for indigestion or heartburn (indigestion).    Marland Kitchen dexlansoprazole (DEXILANT) 60 MG capsule Take 60 mg by mouth daily.     Marland Kitchen PARoxetine (PAXIL) 40 MG tablet Take 1 tablet (40 mg total) by mouth every morning. 90 tablet 0  . sulfamethoxazole-trimethoprim (BACTRIM DS,SEPTRA DS) 800-160 MG per tablet Take 2 tablets by mouth 2 (two) times daily. 28 tablet 0  . [DISCONTINUED] dicyclomine (BENTYL) 20 MG tablet Take 1 tablet (20 mg total) by mouth 2 (two) times daily. (Patient taking differently: Take 20 mg by mouth 2 (two) times daily as needed. ) 20 tablet 0  . [DISCONTINUED] traZODone (DESYREL) 50 MG tablet Take 1/2 to 1 tab at bed time (Patient not taking: Reported on 06/19/2014) 30 tablet 0   Allergies  Allergen Reactions  . Penicillins Rash     Exam:  BP 118/74 mmHg  Pulse 77  Temp(Src) 97 F (36.1 C) (Oral)  Resp 16  SpO2 98% Gen: Well NAD Skin: Area just posterior to the ear erythematous indurated and tender no fluctuance palpated.  No results found for this or any previous visit (from the past 24 hour(s)). No  results found.  Assessment and Plan: 54 y.o. female with early abscess versus cellulitis. Treat with Bactrim. Return as needed. Not drainable yet.  Discussed warning signs or symptoms. Please see discharge instructions. Patient expresses understanding.     Gregor Hams, MD 09/12/14 [id number]

## 2014-09-16 ENCOUNTER — Emergency Department (HOSPITAL_COMMUNITY)
Admission: EM | Admit: 2014-09-16 | Discharge: 2014-09-16 | Disposition: A | Discharge status: Home / Self Care | Payer: Managed Care, Other (non HMO) | Attending: Emergency Medicine | Admitting: Emergency Medicine

## 2014-09-16 ENCOUNTER — Encounter (HOSPITAL_COMMUNITY): Payer: Self-pay | Admitting: Emergency Medicine

## 2014-09-16 DIAGNOSIS — M25511 Pain in right shoulder: Secondary | ICD-10-CM | POA: Insufficient documentation

## 2014-09-16 DIAGNOSIS — R110 Nausea: Secondary | ICD-10-CM | POA: Insufficient documentation

## 2014-09-16 DIAGNOSIS — M5412 Radiculopathy, cervical region: Secondary | ICD-10-CM | POA: Insufficient documentation

## 2014-09-16 DIAGNOSIS — K219 Gastro-esophageal reflux disease without esophagitis: Secondary | ICD-10-CM | POA: Diagnosis not present

## 2014-09-16 DIAGNOSIS — H70001 Acute mastoiditis without complications, right ear: Secondary | ICD-10-CM | POA: Insufficient documentation

## 2014-09-16 DIAGNOSIS — F329 Major depressive disorder, single episode, unspecified: Secondary | ICD-10-CM | POA: Diagnosis not present

## 2014-09-16 DIAGNOSIS — Z880 Allergy status to penicillin: Secondary | ICD-10-CM | POA: Insufficient documentation

## 2014-09-16 DIAGNOSIS — Z79899 Other long term (current) drug therapy: Secondary | ICD-10-CM | POA: Diagnosis not present

## 2014-09-16 DIAGNOSIS — Z720 Tobacco use: Secondary | ICD-10-CM | POA: Insufficient documentation

## 2014-09-16 DIAGNOSIS — M542 Cervicalgia: Secondary | ICD-10-CM | POA: Diagnosis present

## 2014-09-16 LAB — CBC
HEMATOCRIT: 36.9 % (ref 36.0–46.0)
Hemoglobin: 12.3 g/dL (ref 12.0–15.0)
MCH: 29.6 pg (ref 26.0–34.0)
MCHC: 33.3 g/dL (ref 30.0–36.0)
MCV: 88.7 fL (ref 78.0–100.0)
PLATELETS: 210 10*3/uL (ref 150–400)
RBC: 4.16 MIL/uL (ref 3.87–5.11)
RDW: 13.5 % (ref 11.5–15.5)
WBC: 5.2 10*3/uL (ref 4.0–10.5)

## 2014-09-16 LAB — BASIC METABOLIC PANEL
Anion gap: 8 (ref 5–15)
BUN: 11 mg/dL (ref 6–23)
CHLORIDE: 104 mmol/L (ref 96–112)
CO2: 24 mmol/L (ref 19–32)
Calcium: 9 mg/dL (ref 8.4–10.5)
Creatinine, Ser: 0.69 mg/dL (ref 0.50–1.10)
GFR calc Af Amer: >90 mL/min (ref >90)
GFR calc non Af Amer: >90 mL/min (ref >90)
GLUCOSE: 102 mg/dL — AB (ref 70–99)
Potassium: 4 mmol/L (ref 3.5–5.1)
Sodium: 136 mmol/L (ref 135–145)

## 2014-09-16 LAB — I-STAT TROPONIN, ED: TROPONIN I, POC: 0 ng/mL (ref 0.00–0.08)

## 2014-09-16 MED ORDER — ONDANSETRON 4 MG PO TBDP
4.0000 mg | ORAL_TABLET | Freq: Once | ORAL | Status: AC
  Administered 2014-09-16: 4 mg via ORAL
  Filled 2014-09-16: qty 1

## 2014-09-16 MED ORDER — IBUPROFEN 600 MG PO TABS: 600.0000 mg | ORAL_TABLET | Freq: Four times a day (QID) | ORAL | Status: DC | PRN

## 2014-09-16 MED ORDER — HYDROCODONE-ACETAMINOPHEN 5-325 MG PO TABS: 1.0000 | ORAL_TABLET | Freq: Four times a day (QID) | ORAL | Status: DC | PRN

## 2014-09-16 MED ORDER — KETOROLAC TROMETHAMINE 60 MG/2ML IM SOLN
60.0000 mg | Freq: Once | INTRAMUSCULAR | Status: AC
  Administered 2014-09-16: 60 mg via INTRAMUSCULAR
  Filled 2014-09-16: qty 2

## 2014-09-16 MED ORDER — DIAZEPAM 5 MG PO TABS: 5.0000 mg | ORAL_TABLET | Freq: Four times a day (QID) | ORAL | Status: DC | PRN

## 2014-09-16 MED ORDER — DIAZEPAM 5 MG PO TABS
5.0000 mg | ORAL_TABLET | Freq: Once | ORAL | Status: AC
  Administered 2014-09-16: 5 mg via ORAL
  Filled 2014-09-16: qty 1

## 2014-09-16 NOTE — ED Notes (Signed)
Pt. Left with all belongings and refused wheelchair 

## 2014-09-16 NOTE — ED Notes (Signed)
C/o burning pain to R side of neck that radiates to R shoulder and nausea that started earlier today.  Pt was seen at [facility] on 4/23 for abscess behind R ear.  States she has been taking antibiotic but stopped it today due to constipation.  Abscess has been draining.

## 2014-09-16 NOTE — ED Provider Notes (Signed)
CSN: [id number]     Arrival date & time 09/16/14  1711 History   First MD Initiated Contact with Patient 09/16/14 2034     Chief Complaint  Patient presents with  . Neck Pain  . Shoulder Pain  . Abscess     (Consider location/radiation/quality/duration/timing/severity/associated sxs/prior Treatment) HPI  This a 54 year old female who presents with right-sided neck pain. Recent history of abscess behind the right ear. He's currently on Bactrim provided by urgent care. Reports that she's been taking that medication until today. She's concerned it is causing her to be constipated. She states that she had onset upon awakening of right-sided neck pain that radiated into her right shoulder. Denies any chest pain or shortness of breath. Does endorse nausea. Denies any abdominal pain or urinary symptoms. Pain is worse with range of motion of the neck. Current pain is 7 out of 10. She's not taking anything for her pain. Abscess diagnosed several days ago has been draining spontaneously and is improved per patient report.  Past Medical History  Diagnosis Date  . GERD (gastroesophageal reflux disease)   . Depression    Past Surgical History  Procedure Laterality Date  . Abdominal hysterectomy     Family History  Problem Relation Age of Onset  . Depression Sister    History  Substance Use Topics  . Smoking status: Current Every Day Smoker -- 0.50 packs/day for 30 years    Types: Cigarettes  . Smokeless tobacco: Never Used  . Alcohol Use: No   OB History    No data available     Review of Systems  Constitutional: Negative for fever.  Respiratory: Negative for cough, chest tightness and shortness of breath.   Cardiovascular: Negative for chest pain.  Gastrointestinal: Positive for nausea. Negative for vomiting and abdominal pain.  Genitourinary: Negative for dysuria.  Musculoskeletal: Positive for neck pain. Negative for back pain.       Shoulder pain  Skin: Positive for color  change. Negative for wound.  Neurological: Negative for weakness, numbness and headaches.  All other systems reviewed and are negative.     Allergies  Penicillins  Home Medications   Prior to Admission medications   Medication Sig Start Date End Date Taking? Authorizing Provider  calcium carbonate (TUMS - DOSED IN MG ELEMENTAL CALCIUM) 500 MG chewable tablet Chew 2 tablets by mouth as needed for indigestion or heartburn (indigestion).   Yes Historical Provider, MD  dexlansoprazole (DEXILANT) 60 MG capsule Take 60 mg by mouth daily.  11/10/13 11/10/14 Yes Historical Provider, MD  PARoxetine (PAXIL) 40 MG tablet Take 1 tablet (40 mg total) by mouth every morning. 06/19/14  Yes Kathlee Nations, MD  sulfamethoxazole-trimethoprim (BACTRIM DS,SEPTRA DS) 800-160 MG per tablet Take 2 tablets by mouth 2 (two) times daily. 09/12/14  Yes Gregor Hams, MD  traZODone (DESYREL) 50 MG tablet Take 25 mg by mouth at bedtime as needed for sleep.   Yes Historical Provider, MD  diazepam (VALIUM) 5 MG tablet Take 1 tablet (5 mg total) by mouth every 6 (six) hours as needed for muscle spasms. 09/16/14   Merryl Hacker, MD  HYDROcodone-acetaminophen (NORCO/VICODIN) 5-325 MG per tablet Take 1 tablet by mouth every 6 (six) hours as needed. 09/16/14   Merryl Hacker, MD  ibuprofen (ADVIL,MOTRIN) 600 MG tablet Take 1 tablet (600 mg total) by mouth every 6 (six) hours as needed. 09/16/14   Merryl Hacker, MD   BP 98/59 mmHg  Pulse 73  Temp(Src)  97.6 F (36.4 C)  Resp 18  Wt 173 lb 4 oz (78.586 kg)  SpO2 95% Physical Exam  Constitutional: She is oriented to person, place, and time. She appears well-developed and well-nourished. No distress.  HENT:  Head: Normocephalic and atraumatic.  Left Ear: External ear normal.  Mouth/Throat: Oropharynx is clear and moist.  Draining abscess noted at the base of the right mastoid, no significant erythema noted, no tenderness along the mastoid  Eyes: Pupils are equal,  round, and reactive to light.  Neck: Normal range of motion. Neck supple.  No midline C-spine tenderness, tenderness to palpation over the right brachial plexus with reproduction of pain  Cardiovascular: Normal rate, regular rhythm and normal heart sounds.   Pulmonary/Chest: Effort normal and breath sounds normal. No respiratory distress. She has no wheezes.  Abdominal: Soft. Bowel sounds are normal. There is no tenderness. There is no rebound.  Musculoskeletal: She exhibits no edema.  Neurological: She is alert and oriented to person, place, and time.  5 out of 5 grip strength bilaterally  Skin: Skin is warm and dry.  Psychiatric: She has a normal mood and affect.  Nursing note and vitals reviewed.   ED Course  Procedures (including critical care time) Labs Review Labs Reviewed  BASIC METABOLIC PANEL - Abnormal; Notable for the following:    Glucose, Bld 102 (*)    All other components within normal limits  CBC  I-STAT TROPOININ, ED    Imaging Review No results found.   EKG Interpretation   Date/Time:  Wednesday September 16 2014 17:24:45 EDT Ventricular Rate:  85 PR Interval:  174 QRS Duration: 82 QT Interval:  382 QTC Calculation: 454 R Axis:   26 Text Interpretation:  Normal sinus rhythm Cannot rule out Anterior infarct  , age undetermined Abnormal ECG Confirmed by Lalonnie Shaffer  MD, Pauline Pegues ([id number])  on 09/17/2014 2:55:05 PM      MDM   Final diagnoses:  Cervical radicular pain    Patient presents with right-sided neck pain. Started today. Is reproducible on exam and worse with range of motion. Suspect cervical strain. Lab work and EKG reviewed from triage and reassuring. Patient given Valium and Toradol. No focal deficits, weakness, or numbness. On recheck, patient reports improvement of symptoms. Discussed with patient continue supportive care. Low suspicion at this time the pain is related to abscess currently being treated. No signs of systemic or localized infection  otherwise. Patient stated understanding and was given strict return precautions.  After history, exam, and medical workup I feel the patient has been appropriately medically screened and is safe for discharge home. Pertinent diagnoses were discussed with the patient. Patient was given return precautions.   Merryl Hacker, MD 09/17/14 [phone]

## 2014-09-16 NOTE — Discharge Instructions (Signed)

## 2014-09-18 ENCOUNTER — Encounter (HOSPITAL_COMMUNITY): Payer: Self-pay | Admitting: Psychiatry

## 2014-09-18 ENCOUNTER — Ambulatory Visit (INDEPENDENT_AMBULATORY_CARE_PROVIDER_SITE_OTHER): Payer: 59 | Admitting: Psychiatry

## 2014-09-18 VITALS — BP 104/66 | HR 67 | Ht 64.0 in | Wt 176.2 lb

## 2014-09-18 DIAGNOSIS — F32A Depression, unspecified: Secondary | ICD-10-CM

## 2014-09-18 DIAGNOSIS — F321 Major depressive disorder, single episode, moderate: Secondary | ICD-10-CM

## 2014-09-18 DIAGNOSIS — F329 Major depressive disorder, single episode, unspecified: Secondary | ICD-10-CM

## 2014-09-18 MED ORDER — PAROXETINE HCL 40 MG PO TABS
40.0000 mg | ORAL_TABLET | ORAL | Status: DC
Start: 2014-09-18 — End: 2015-01-15

## 2014-09-18 NOTE — Progress Notes (Signed)
[facility] [phone] Progress Note  Melissa Case [id number] 54 y.o.  09/18/2014 10:25 AM  Chief Complaint: I am feeling sad and depressed.  I cannot sleep I have pain in my shoulder and neck.  I have been to the emergency room and I was given medication.    History of Present Illness: Melissa Case came for her followup appointment .  She is complaining of increased anxiety, depression, social isolation.  She admitted poor sleep and racing thoughts.  She started taking trazodone to 3 times a week.  She has seen in the emergency room because of shoulder pain , neck pain and generalized fatigue.  She believe that she had an abscess but as per notes it appears that she had a cervical strain.  She was given hydrocodone and Valium but patient was scared to take it.  She does not want to mix with her psychotropic medication.  She is also complaining of limited socialization.  She feels sad about her family members history her children who does not want to associate with her.  She feels sometimes very discouraged with low self-esteem.  She admitted at times crying spells but denies any active or passive suicidal thoughts.  She feels some time irritable and hopeless but denies any paranoia or any hallucination.  Her appetite is okay.  Her energy level is decreased.  She denies any hallucination or any paranoia.  She is working and her husband is very supportive.  Patient denies drinking or using any illegal substances.  Her vitals are stable.  She has 3 son and 1 daughter.  Her younger son comes by sometimes, her middle son lives and her home and her oldest live in [place].  Patient admitted having issues with her daughter and recently she is not communicating with her.   Suicidal Ideation: No Plan Formed: No Patient has means to carry out plan: No  Homicidal Ideation: No Plan Formed: No Patient has means to carry out plan: No  Review of Systems  Constitutional: Positive for malaise/fatigue.   Musculoskeletal: Positive for back pain and neck pain.  Skin: Negative for itching and rash.  Neurological: Negative for dizziness, tremors and headaches.  Psychiatric/Behavioral: Positive for depression. Negative for suicidal ideas, hallucinations and substance abuse. The patient is nervous/anxious and has insomnia.     Psychiatric: Agitation: No Hallucination: No Depressed Mood: Yes Insomnia: Yes Hypersomnia: No Altered Concentration: No Feels Worthless: No Grandiose Ideas: No Belief In Special Powers: No New/Increased Substance Abuse: No Compulsions: No  Neurologic: Headache: No Seizure: No Paresthesias: No  Medical History:  Patient has a history of acid reflux and barrett esophagus .  Her primary care physician is Eldridge Abrahams and she see Dr. Tiffany Kocher at [facility] for her GI issues.    Outpatient Encounter Prescriptions as of 09/18/2014  Medication Sig  . traZODone (DESYREL) 50 MG tablet Take 25 mg by mouth at bedtime as needed for sleep.  . calcium carbonate (TUMS - DOSED IN MG ELEMENTAL CALCIUM) 500 MG chewable tablet Chew 2 tablets by mouth as needed for indigestion or heartburn (indigestion).  Marland Kitchen dexlansoprazole (DEXILANT) 60 MG capsule Take 60 mg by mouth daily.   . diazepam (VALIUM) 5 MG tablet Take 1 tablet (5 mg total) by mouth every 6 (six) hours as needed for muscle spasms.  Marland Kitchen HYDROcodone-acetaminophen (NORCO/VICODIN) 5-325 MG per tablet Take 1 tablet by mouth every 6 (six) hours as needed.  Marland Kitchen ibuprofen (ADVIL,MOTRIN) 600 MG tablet Take 1 tablet (600  mg total) by mouth every 6 (six) hours as needed.  Marland Kitchen PARoxetine (PAXIL) 40 MG tablet Take 1 tablet (40 mg total) by mouth every morning.  . sulfamethoxazole-trimethoprim (BACTRIM DS,SEPTRA DS) 800-160 MG per tablet Take 2 tablets by mouth 2 (two) times daily.  . [DISCONTINUED] PARoxetine (PAXIL) 40 MG tablet Take 1 tablet (40 mg total) by mouth every morning.    Past Psychiatric  History/Hospitalization(s): Anxiety: Yes Bipolar Disorder: No Depression: Yes Mania: No Psychosis: No Schizophrenia: No Personality Disorder: No Hospitalization for psychiatric illness: Yes History of Electroconvulsive Shock Therapy: No Prior Suicide Attempts: No  Physical Exam: Constitutional:  BP 104/66 mmHg  Pulse 67  Ht 5' 4"  (1.626 m)  Wt 176 lb 3.2 oz (79.924 kg)  BMI 30.23 kg/m2  Recent Results (from the past 2160 hour(s))  CBC     Status: None   Collection Time: 09/16/14  5:28 PM  Result Value Ref Range   WBC 5.2 4.0 - 10.5 K/uL   RBC 4.16 3.87 - 5.11 MIL/uL   Hemoglobin 12.3 12.0 - 15.0 g/dL   HCT 36.9 36.0 - 46.0 %   MCV 88.7 78.0 - 100.0 fL   MCH 29.6 26.0 - 34.0 pg   MCHC 33.3 30.0 - 36.0 g/dL   RDW 13.5 11.5 - 15.5 %   Platelets 210 150 - 400 K/uL  Basic metabolic panel     Status: Abnormal   Collection Time: 09/16/14  5:28 PM  Result Value Ref Range   Sodium 136 135 - 145 mmol/L   Potassium 4.0 3.5 - 5.1 mmol/L   Chloride 104 96 - 112 mmol/L   CO2 24 19 - 32 mmol/L   Glucose, Bld 102 (H) 70 - 99 mg/dL   BUN 11 6 - 23 mg/dL   Creatinine, Ser 0.69 0.50 - 1.10 mg/dL   Calcium 9.0 8.4 - 10.5 mg/dL   GFR calc non Af Amer >90 >90 mL/min   GFR calc Af Amer >90 >90 mL/min    Comment: (NOTE) The eGFR has been calculated using the CKD EPI equation. This calculation has not been validated in all clinical situations. eGFR's persistently <90 mL/min signify possible Chronic Kidney Disease.    Anion gap 8 5 - 15  I-stat troponin, ED (not at [facility])     Status: None   Collection Time: 09/16/14  5:45 PM  Result Value Ref Range   Troponin i, poc 0.00 0.00 - 0.08 ng/mL   Comment 3            Comment: Due to the release kinetics of cTnI, a negative result within the first hours of the onset of symptoms does not rule out myocardial infarction with certainty. If myocardial infarction is still suspected, repeat the test at appropriate intervals.    General  Appearance: alert, oriented, no acute distress and well nourished  Musculoskeletal: Strength & Muscle Tone: within normal limits Gait & Station: normal Patient leans: N/A  Mental status examination: Patient is casually dressed and fairly groomed.  She described her mood depressed sad and anxious.  Her affect is constricted.  She maintained fair eye contact.  Her speech is soft clear and coherent.  Her thought process logical and goal-directed.  Her attention and concentration is fair.  She denies any auditory or visual hallucination.  She denies any active or passive suicidal thoughts or homicidal thought.  There were no delusions, paranoia or any obsessive thoughts.  Her psychomotor activity is slow.  She has no tremors or  any shakes.  Her fund of knowledge is adequate.  Her cognition is intact.  She's alert and oriented 3.  Her insight judgment and impulse control is okay.  Established Problem, Stable/Improving (1), New problem, with additional work up planned, Review of Psycho-Social Stressors (1), Review or order clinical lab tests (1), Review and summation of old records (2), Established Problem, Worsening (2), New Problem, with no additional work-up planned (3), Review of Last Therapy Session (1) and Review of Medication Regimen & Side Effects (2)  Assessment: Axis I: Major depressive disorder, recurrent moderate   Axis II: Deferred  Axis III:  Patient Active Problem List   Diagnosis Date Noted  . Abdominal pain 09/30/2013  . SBO (small bowel obstruction) 09/30/2013  . 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 Gall stone 04/05/2012  . Depression 09/15/2011    Plan:  I review her symptoms, history, collateral information from emergency room and recent blood work.  She is experiencing increased anxiety and depression.  However she does not want to increase her antidepressant.  She started taking trazodone for insomnia which is helping her sleep.  I explained that she was given Valium and  hydrocodone which are control substance and can cause interference with the psychotropic medication .  Patient like to go see her primary care [facility] about her pain and she liked to get nonnarcotic pain medication from her.  I also talk about seeing a therapist in the office for coping and social skills.  She admitted lot of family issues and she is open to get counseling.  We will schedule appointment with therapist in this office.  At this time she is not interested to change or increase the dose of antidepressant.  However she will see her in 6 weeks and if symptoms do not improve she will call us before then.  Discussed medication side effects.  Recommended to call us back if she has any question , concern or if she feel worsening of the symptom.  Follow-up in 6 weeks.Time spent 25 minutes.  More than 50% of the time spent in psychoeducation, counseling and coordination of care.  Discuss safety plan that anytime having active suicidal thoughts or homicidal thoughts then patient need to call 911 or go to the local emergency room.   Kaizlee Carlino T., MD 09/18/2014

## 2014-10-02 ENCOUNTER — Other Ambulatory Visit (HOSPITAL_COMMUNITY): Payer: Self-pay | Admitting: Psychiatry

## 2014-10-02 DIAGNOSIS — F32A Depression, unspecified: Secondary | ICD-10-CM

## 2014-10-02 DIAGNOSIS — F329 Major depressive disorder, single episode, unspecified: Secondary | ICD-10-CM

## 2014-10-02 NOTE — Telephone Encounter (Signed)
New 90 day prescription request declined at this time as was too early to refill Paxil request.  A new order was sent to patient's [facility] on [address] for 90 day supply on 09/18/14.

## 2014-10-14 ENCOUNTER — Telehealth (HOSPITAL_COMMUNITY): Payer: Self-pay

## 2014-10-14 NOTE — Telephone Encounter (Signed)
Telephone call with patient to follow up on her message questioning if a 90 day supply of her Paxil had been sent to [facility] home delivery?  Informed patient a new 90 day order for her Paxil was sent to [facility] on [address].  Patient agreed to call [facility] to have them transfer the order to [facility] and will call back if any problems getting medication transferred.

## 2014-10-16 ENCOUNTER — Ambulatory Visit (HOSPITAL_COMMUNITY): Payer: Self-pay | Admitting: Psychology

## 2014-10-30 ENCOUNTER — Ambulatory Visit (HOSPITAL_COMMUNITY): Payer: Self-pay | Admitting: Psychiatry

## 2014-12-11 ENCOUNTER — Ambulatory Visit (HOSPITAL_COMMUNITY): Payer: Self-pay | Admitting: Psychiatry

## 2015-01-15 ENCOUNTER — Encounter (HOSPITAL_COMMUNITY): Payer: Self-pay | Admitting: Psychiatry

## 2015-01-15 ENCOUNTER — Ambulatory Visit (INDEPENDENT_AMBULATORY_CARE_PROVIDER_SITE_OTHER): Payer: 59 | Admitting: Psychiatry

## 2015-01-15 VITALS — BP 118/67 | HR 67 | Ht 64.0 in | Wt 181.0 lb

## 2015-01-15 DIAGNOSIS — F331 Major depressive disorder, recurrent, moderate: Secondary | ICD-10-CM | POA: Diagnosis not present

## 2015-01-15 DIAGNOSIS — F329 Major depressive disorder, single episode, unspecified: Secondary | ICD-10-CM

## 2015-01-15 DIAGNOSIS — F32A Depression, unspecified: Secondary | ICD-10-CM

## 2015-01-15 MED ORDER — PAROXETINE HCL 40 MG PO TABS: 40.0000 mg | ORAL_TABLET | ORAL | Status: DC

## 2015-01-15 NOTE — Progress Notes (Signed)
[facility] Progress Note  Melissa Case [id number] 54 y.o.  01/15/2015 8:37 AM  Chief Complaint: Medication management and follow-up.     History of Present Illness: Melissa Case came for her followup appointment .   She had missed appointment in July because of family member being sick.  However she was taking her medication as prescribed.  She is feeling much better since family issues are resolved.  She celebrated her birthday with her grandchild and her son. .  She had a very good summer.  She is no longer taking Valium or any pain medication.  Her pain is much better and occasionally she takes ibuprofen.  She denies any irritability, anger, mood swing.  Her sleep is good and she rarely takes the trazodone.  She denies any feeling of hopelessness or worthlessness.  She wants to continue her current Paxil.  She has no side effects including any tremors or shakes.  She does not feel that she needs counseling at this time.  Patient denies drinking or using any illegal substances.  Her energy level is good.  She is social and active.  Her appetite is okay.  Her vitals are stable.   Suicidal Ideation: No Plan Formed: No Patient has means to carry out plan: No  Homicidal Ideation: No Plan Formed: No Patient has means to carry out plan: No  Review of Systems  Musculoskeletal: Positive for back pain.  Skin: Negative for itching and rash.  Neurological: Negative for dizziness, tremors and headaches.  Psychiatric/Behavioral: Negative for suicidal ideas, hallucinations and substance abuse.    Psychiatric: Agitation: No Hallucination: No Depressed Mood: No Insomnia: No Hypersomnia: No Altered Concentration: No Feels Worthless: No Grandiose Ideas: No Belief In Special Powers: No New/Increased Substance Abuse: No Compulsions: No  Neurologic: Headache: No Seizure: No Paresthesias: No  Medical History:  Patient has a history of acid reflux and barrett esophagus .  Her  primary care physician is Eldridge Abrahams and she see Dr. Tiffany Kocher at [facility] for her GI issues.    Outpatient Encounter Prescriptions as of 01/15/2015  Medication Sig  . ibuprofen (ADVIL,MOTRIN) 600 MG tablet Take 1 tablet (600 mg total) by mouth every 6 (six) hours as needed.  Marland Kitchen PARoxetine (PAXIL) 40 MG tablet Take 1 tablet (40 mg total) by mouth every morning.  . traZODone (DESYREL) 50 MG tablet Take 25 mg by mouth at bedtime as needed for sleep.  . [DISCONTINUED] calcium carbonate (TUMS - DOSED IN MG ELEMENTAL CALCIUM) 500 MG chewable tablet Chew 2 tablets by mouth as needed for indigestion or heartburn (indigestion).  . [DISCONTINUED] dexlansoprazole (DEXILANT) 60 MG capsule Take 60 mg by mouth daily.   . [DISCONTINUED] diazepam (VALIUM) 5 MG tablet Take 1 tablet (5 mg total) by mouth every 6 (six) hours as needed for muscle spasms. (Patient not taking: Reported on 01/15/2015)  . [DISCONTINUED] HYDROcodone-acetaminophen (NORCO/VICODIN) 5-325 MG per tablet Take 1 tablet by mouth every 6 (six) hours as needed. (Patient not taking: Reported on 01/15/2015)  . [DISCONTINUED] PARoxetine (PAXIL) 40 MG tablet Take 1 tablet (40 mg total) by mouth every morning.  . [DISCONTINUED] sulfamethoxazole-trimethoprim (BACTRIM DS,SEPTRA DS) 800-160 MG per tablet Take 2 tablets by mouth 2 (two) times daily. (Patient not taking: Reported on 01/15/2015)   No facility-administered encounter medications on file as of 01/15/2015.    Past Psychiatric History/Hospitalization(s): Anxiety: Yes Bipolar Disorder: No Depression: Yes Mania: No Psychosis: No Schizophrenia: No Personality Disorder: No Hospitalization for psychiatric illness:  Yes History of Electroconvulsive Shock Therapy: No Prior Suicide Attempts: No  Physical Exam: Constitutional:  BP 118/67 mmHg  Pulse 67  Ht 5\' 4"  (1.626 m)  Wt 181 lb (82.101 kg)  BMI 31.05 kg/m2  No results found for this or any previous visit (from  the past 2160 hour(s)). General Appearance: alert, oriented, no acute distress and well nourished  Musculoskeletal: Strength & Muscle Tone: within normal limits Gait & Station: normal Patient leans: N/A  Mental status examination: Patient is casually dressed and fairly groomed.   She is pleasant and maintained good eye contact.  She described her mood euthymic and her affect is appropriate. Her speech is soft clear and coherent.  Her thought process logical and goal-directed.  Her attention and concentration is fair.  She denies any auditory or visual hallucination.  She denies any active or passive suicidal thoughts or homicidal thought.  There were no delusions, paranoia or any obsessive thoughts.  Her psychomotor activity is slow.  She has no tremors or any shakes.  Her fund of knowledge is adequate.  Her cognition is intact.  She's alert and oriented 3.  Her insight judgment and impulse control is okay.  Established Problem, Stable/Improving (1), Review of Psycho-Social Stressors (1), Review of Last Therapy Session (1) and Review of Medication Regimen & Side Effects (2)  Assessment: Axis I: Major depressive disorder, recurrent moderate   Axis II: Deferred  Axis III:  Patient Active Problem List   Diagnosis Date Noted  . Abdominal pain 09/30/2013  . SBO (small bowel obstruction) 09/30/2013  . 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 Gall stone 04/05/2012  . Depression 09/15/2011    Plan:  Patient is doing better on current dose of Paxil.  She has no side effects.  She does not feel she needs counseling at this time.  She has not used trazodone in a while however she liked to keep the bottle if in case she has insomnia.  She is no longer taking Valium and Percocet.  Discussed medication side effects and benefits.  Encouraged to keep appointment for continuation of treatment.  Recommended to call us back if she has any question or any concern.  Follow-up in 3 months.   [name],  MD 01/15/2015

## 2015-04-09 ENCOUNTER — Ambulatory Visit (HOSPITAL_COMMUNITY): Payer: Self-pay | Admitting: Psychiatry

## 2015-05-08 ENCOUNTER — Other Ambulatory Visit (HOSPITAL_COMMUNITY): Payer: Self-pay | Admitting: Psychiatry

## 2015-05-10 ENCOUNTER — Other Ambulatory Visit (HOSPITAL_COMMUNITY): Payer: Self-pay | Admitting: Psychiatry

## 2015-05-10 DIAGNOSIS — F329 Major depressive disorder, single episode, unspecified: Secondary | ICD-10-CM

## 2015-05-10 DIAGNOSIS — F32A Depression, unspecified: Secondary | ICD-10-CM

## 2015-05-10 MED ORDER — PAROXETINE HCL 40 MG PO TABS
40.0000 mg | ORAL_TABLET | ORAL | Status: DC
Start: 2015-05-10 — End: 2015-06-04

## 2015-06-04 ENCOUNTER — Encounter (HOSPITAL_COMMUNITY): Payer: Self-pay | Admitting: Psychiatry

## 2015-06-04 ENCOUNTER — Ambulatory Visit (INDEPENDENT_AMBULATORY_CARE_PROVIDER_SITE_OTHER): Payer: 59 | Admitting: Psychiatry

## 2015-06-04 VITALS — BP 102/64 | HR 56 | Ht 64.0 in | Wt 181.4 lb

## 2015-06-04 DIAGNOSIS — F329 Major depressive disorder, single episode, unspecified: Secondary | ICD-10-CM

## 2015-06-04 DIAGNOSIS — F331 Major depressive disorder, recurrent, moderate: Secondary | ICD-10-CM | POA: Diagnosis not present

## 2015-06-04 DIAGNOSIS — F32A Depression, unspecified: Secondary | ICD-10-CM

## 2015-06-04 MED ORDER — PAROXETINE HCL 40 MG PO TABS
40.0000 mg | ORAL_TABLET | ORAL | Status: DC
Start: 2015-06-04 — End: 2015-11-02

## 2015-06-04 NOTE — Progress Notes (Signed)
[facility] [phone] Progress Note  Melissa Case [id number] 55 y.o.  06/04/2015 9:01 AM  Chief Complaint: Medication management and follow-up.     History of Present Illness: Melissa Case came for her followup appointment .  She was last seen in August and she missed appointment in November.  She is taking her medication as prescribed.  She had a good Christmas.  She was able to see her son who live close by.  She still have some minor issues in the family but overall she is doing very well.  Her husband is very supportive.  She continues to work in a [employer] and there has been no new issues.  She denies any irritability, panic attack or any crying spells.  She rarely takes trazodone and her sleep is good.  She likes Paxil and denies any side effects.  She denies any paranoia, hallucination, feeling of hopelessness or worthlessness.  Patient had a visit to her primary care physician in September and her labs are normal.  Patient denies drinking or using any illegal substances.  Her energy level is good.  Her vitals are stable.  She is social and active with her intermediate family members.   Suicidal Ideation: No Plan Formed: No Patient has means to carry out plan: No  Homicidal Ideation: No Plan Formed: No Patient has means to carry out plan: No  Review of Systems  Musculoskeletal: Positive for back pain.  Skin: Negative for itching and rash.  Neurological: Negative for dizziness, tremors and headaches.  Psychiatric/Behavioral: Negative for suicidal ideas, hallucinations and substance abuse.    Psychiatric: Agitation: No Hallucination: No Depressed Mood: No Insomnia: No Hypersomnia: No Altered Concentration: No Feels Worthless: No Grandiose Ideas: No Belief In Special Powers: No New/Increased Substance Abuse: No Compulsions: No  Neurologic: Headache: No Seizure: No Paresthesias: No  Medical History:  Patient has a history of acid reflux and barrett  esophagus .  Her primary care physician is [facility], [facility].  She had blood work in February 05, 2015 .  Her WBC count was 6.7, hemoglobin 13.7 and hematocrit 41.  Her AST and ALT is 14.  Her hemoglobin A1c is 5.7.  Her BUN is 10 and creatinine 0.6.  Outpatient Encounter Prescriptions as of 06/04/2015  Medication Sig  . calcium carbonate (TUMS - DOSED IN MG ELEMENTAL CALCIUM) 500 MG chewable tablet Chew by mouth.  . esomeprazole (NEXIUM) 40 MG capsule 40 mg at bedtime.  Marland Kitchen ibuprofen (ADVIL,MOTRIN) 600 MG tablet Take 1 tablet (600 mg total) by mouth every 6 (six) hours as needed.  Marland Kitchen PARoxetine (PAXIL) 40 MG tablet Take 1 tablet (40 mg total) by mouth every morning.  . traZODone (DESYREL) 50 MG tablet Take 25 mg by mouth at bedtime as needed for sleep.  . [DISCONTINUED] PARoxetine (PAXIL) 40 MG tablet Take 1 tablet (40 mg total) by mouth every morning.   No facility-administered encounter medications on file as of 06/04/2015.    Past Psychiatric History/Hospitalization(s): Anxiety: Yes Bipolar Disorder: No Depression: Yes Mania: No Psychosis: No Schizophrenia: No Personality Disorder: No Hospitalization for psychiatric illness: Yes History of Electroconvulsive Shock Therapy: No Prior Suicide Attempts: No  Physical Exam: Constitutional:  BP 102/64 mmHg  Pulse 56  Ht 5\' 4"  (1.626 m)  Wt 181 lb 6.4 oz (82.283 kg)  BMI 31.12 kg/m2  No results found for this or any previous visit (from the past 2160 hour(s)). General Appearance: alert, oriented, no acute distress and well nourished  Musculoskeletal: Strength & Muscle  Tone: within normal limits Gait & Station: normal Patient leans: N/A  Mental status examination: Patient is casually dressed and fairly groomed.   She is pleasant and maintained good eye contact.  She described her mood euthymic and her affect is appropriate. Her speech is soft clear and coherent.  Her thought process logical and goal-directed.  Her attention and  concentration is fair.  She denies any auditory or visual hallucination.  She denies any active or passive suicidal thoughts or homicidal thought.  There were no delusions, paranoia or any obsessive thoughts.  Her psychomotor activity is slow.  She has no tremors or any shakes.  Her fund of knowledge is adequate.  Her cognition is intact.  She's alert and oriented 3.  Her insight judgment and impulse control is okay.  Established Problem, Stable/Improving (1), Review of Psycho-Social Stressors (1), Review or order clinical lab tests (1), Review of Last Therapy Session (1) and Review of Medication Regimen & Side Effects (2)  Assessment: Axis I: Major depressive disorder, recurrent moderate   Axis II: Deferred  Axis III:  Patient Active Problem List   Diagnosis Date Noted  . Abdominal pain 09/30/2013  . SBO (small bowel obstruction) ([place]) 09/30/2013  . 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 Gall stone 04/05/2012  . Depression 09/15/2011    Plan:  I reviewed records and blood work results from her primary care physician.  I labs are normal.  Patient is stable on Paxil.  She has no tremors, shakes or EPS.  She does not need a new prescription of trazodone.  I will continue Paxil 40 mg daily.  Discussed medication side effects and benefits.  Recommended to call us back if she has any question or any concern.  Follow-up in 4 months.   Treyvion Durkee T., MD 06/04/2015

## 2015-10-01 ENCOUNTER — Ambulatory Visit (HOSPITAL_COMMUNITY): Payer: Self-pay | Admitting: Psychiatry

## 2015-11-02 ENCOUNTER — Encounter (HOSPITAL_COMMUNITY): Payer: Self-pay | Admitting: Psychiatry

## 2015-11-02 ENCOUNTER — Ambulatory Visit (INDEPENDENT_AMBULATORY_CARE_PROVIDER_SITE_OTHER): Payer: 59 | Admitting: Psychiatry

## 2015-11-02 VITALS — BP 118/64 | HR 74 | Ht 64.0 in | Wt 175.6 lb

## 2015-11-02 DIAGNOSIS — F32A Depression, unspecified: Secondary | ICD-10-CM

## 2015-11-02 DIAGNOSIS — F329 Major depressive disorder, single episode, unspecified: Secondary | ICD-10-CM

## 2015-11-02 MED ORDER — PAROXETINE HCL 40 MG PO TABS
40.0000 mg | ORAL_TABLET | ORAL | Status: DC
Start: 2015-11-02 — End: 2016-04-26

## 2015-11-02 NOTE — Progress Notes (Signed)
[place] [id number] Progress Note  Perrine Shutters [id number] 55 y.o.  11/02/2015 10:27 AM  Chief Complaint: Medication management and follow-up.     History of Present Illness: Melissa Case came for her followup appointment .  She is taking her medication as prescribed.  She denies any irritability, anger, mood swing.  Her daughter recently visited from [place] and she had a good time but then.  She also seen her other grandkids who live in [place] .  She recently changed her job and she is adjusting very well with her new job.  Her son who lives in [place] has been in trouble because she find out that he was using bad checks and now he was charged and have to go to court next week.  Patient like Paxil.  Her sleep is good and she has not taken trazodone in past 6 months.  She reported no side effects.  Her energy level is good. She denies any paranoia, hallucination, feeling of hopelessness or worthlessness.  Her vitals are stable.  She is social and active with her intermediate family members.   Suicidal Ideation: No Plan Formed: No Patient has means to carry out plan: No  Homicidal Ideation: No Plan Formed: No Patient has means to carry out plan: No  Review of Systems  Musculoskeletal: Positive for back pain.  Skin: Negative for itching and rash.  Neurological: Negative for dizziness, tremors and headaches.  Psychiatric/Behavioral: Negative for suicidal ideas, hallucinations and substance abuse.    Psychiatric: Agitation: No Hallucination: No Depressed Mood: No Insomnia: No Hypersomnia: No Altered Concentration: No Feels Worthless: No Grandiose Ideas: No Belief In Special Powers: No New/Increased Substance Abuse: No Compulsions: No  Neurologic: Headache: No Seizure: No Paresthesias: No  Medical History:  Patient has a history of acid reflux and barrett esophagus .  Her primary care physician is [facility].  She had blood work in February 05, 2015 .  Her  WBC count was 6.7, hemoglobin 13.7 and hematocrit 41.  Her AST and ALT is 14.  Her hemoglobin A1c is 5.7.  Her BUN is 10 and creatinine 0.6.  Outpatient Encounter Prescriptions as of 11/02/2015  Medication Sig  . calcium carbonate (TUMS - DOSED IN MG ELEMENTAL CALCIUM) 500 MG chewable tablet Chew by mouth.  . esomeprazole (NEXIUM) 40 MG capsule 40 mg at bedtime.  Marland Kitchen ibuprofen (ADVIL,MOTRIN) 600 MG tablet Take 1 tablet (600 mg total) by mouth every 6 (six) hours as needed.  Marland Kitchen PARoxetine (PAXIL) 40 MG tablet Take 1 tablet (40 mg total) by mouth every morning.  . terbinafine (LAMISIL) 250 MG tablet 250 mg.  . traZODone (DESYREL) 50 MG tablet Take 25 mg by mouth at bedtime as needed for sleep.  . [DISCONTINUED] PARoxetine (PAXIL) 40 MG tablet Take 1 tablet (40 mg total) by mouth every morning.   No facility-administered encounter medications on file as of 11/02/2015.    Past Psychiatric History/Hospitalization(s): Anxiety: Yes Bipolar Disorder: No Depression: Yes Mania: No Psychosis: No Schizophrenia: No Personality Disorder: No Hospitalization for psychiatric illness: Yes History of Electroconvulsive Shock Therapy: No Prior Suicide Attempts: No  Physical Exam: Constitutional:  BP 118/64 mmHg  Pulse 74  Ht 5\' 4"  (1.626 m)  Wt 175 lb 9.6 oz (79.652 kg)  BMI 30.13 kg/m2  No results found for this or any previous visit (from the past 2160 hour(s)). General Appearance: alert, oriented, no acute distress and well nourished  Musculoskeletal: Strength & Muscle Tone: within normal limits Gait & Station: normal  Patient leans: N/A  Mental status examination: Patient is casually dressed and fairly groomed.   She is pleasant and maintained good eye contact.  She described her mood euthymic and her affect is appropriate. Her speech is soft clear and coherent.  Her thought process logical and goal-directed.  Her attention and concentration is fair.  She denies any auditory or visual  hallucination.  She denies any active or passive suicidal thoughts or homicidal thought.  There were no delusions, paranoia or any obsessive thoughts.  Her psychomotor activity is slow.  She has no tremors or any shakes.  Her fund of knowledge is adequate.  Her cognition is intact.  She's alert and oriented 3.  Her insight judgment and impulse control is okay.  Established Problem, Stable/Improving (1), Review of Psycho-Social Stressors (1), Review of Last Therapy Session (1) and Review of Medication Regimen & Side Effects (2)  Assessment: Axis I: Major depressive disorder, recurrent moderate   Axis II: Deferred  Axis III:  Patient Active Problem List   Diagnosis Date Noted  . Abdominal pain 09/30/2013  . SBO (small bowel obstruction) ([place]) 09/30/2013  . 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 Gall stone 04/05/2012  . Depression 09/15/2011    Plan:  Patient is a stable on her current psychiatric medication.  She has no side effects.  I will continue Paxil 40 mg daily.  She has no tremors, shakes or EPS.  She does not need a new prescription of trazodone.  Discussed medication side effects and benefits.  Recommended to call us back if she has any question or any concern.  Follow-up in 4 months.   Nile Prisk T., MD 11/02/2015

## 2016-02-02 ENCOUNTER — Ambulatory Visit (HOSPITAL_COMMUNITY): Payer: Self-pay | Admitting: Psychiatry

## 2016-03-23 ENCOUNTER — Ambulatory Visit (HOSPITAL_COMMUNITY): Payer: Self-pay | Admitting: Psychiatry

## 2016-04-20 ENCOUNTER — Other Ambulatory Visit (HOSPITAL_COMMUNITY): Payer: Self-pay | Admitting: Psychiatry

## 2016-04-20 DIAGNOSIS — F32A Depression, unspecified: Secondary | ICD-10-CM

## 2016-04-20 DIAGNOSIS — F329 Major depressive disorder, single episode, unspecified: Secondary | ICD-10-CM

## 2016-04-26 ENCOUNTER — Ambulatory Visit (INDEPENDENT_AMBULATORY_CARE_PROVIDER_SITE_OTHER): Payer: 59 | Admitting: Psychiatry

## 2016-04-26 ENCOUNTER — Encounter (HOSPITAL_COMMUNITY): Payer: Self-pay | Admitting: Psychiatry

## 2016-04-26 VITALS — BP 110/68 | HR 64 | Ht 64.0 in | Wt 177.2 lb

## 2016-04-26 DIAGNOSIS — Z9889 Other specified postprocedural states: Secondary | ICD-10-CM | POA: Diagnosis not present

## 2016-04-26 DIAGNOSIS — Z880 Allergy status to penicillin: Secondary | ICD-10-CM

## 2016-04-26 DIAGNOSIS — F330 Major depressive disorder, recurrent, mild: Secondary | ICD-10-CM

## 2016-04-26 DIAGNOSIS — Z79899 Other long term (current) drug therapy: Secondary | ICD-10-CM

## 2016-04-26 DIAGNOSIS — Z818 Family history of other mental and behavioral disorders: Secondary | ICD-10-CM | POA: Diagnosis not present

## 2016-04-26 DIAGNOSIS — F17210 Nicotine dependence, cigarettes, uncomplicated: Secondary | ICD-10-CM

## 2016-04-26 MED ORDER — PAROXETINE HCL 40 MG PO TABS
40.0000 mg | ORAL_TABLET | ORAL | 1 refills | Status: DC
Start: 2016-04-26 — End: 2016-10-26

## 2016-04-26 MED ORDER — TRAZODONE HCL 50 MG PO TABS: 25.0000 mg | ORAL_TABLET | Freq: Every evening | ORAL | 1 refills | Status: DC | PRN

## 2016-04-26 NOTE — Progress Notes (Signed)
[name] MD/PA/NP OP Progress Note  04/26/2016 8:50 AM Melissa Case  MRN:  [id number]  Chief Complaint:  Chief Complaint    Follow-up     Subjective:  I am doing fine.  I had a good Thanksgiving.  HPI: Melissa Case came for her follow-up appointment.  She is taking her medication as prescribed.  She had a good Thanksgiving.  She reported no side effects.  She sleeping good.  She denies any irritability, mania, anger or any hallucination.  She is happy her job is going very well.  This coming February it will be a 1 year and her job.  She lives with her husband is very supportive.  She is happy that family issues are slowly resolving.  Patient denies crying spells, feeling of hopelessness or worthlessness.  Her energy level is good.  Her vital signs are stable.  She scheduled [facility]  Visit Diagnosis:    ICD-9-CM ICD-10-CM   1. Mild episode of recurrent major depressive disorder (HCC) 296.31 F33.0 traZODone (DESYREL) 50 MG tablet     PARoxetine (PAXIL) 40 MG tablet    Past Psychiatric History: Reviewed unchanged.  Past Medical History:  Past Medical History:  Diagnosis Date  . Depression   . GERD (gastroesophageal reflux disease)     Past Surgical History:  Procedure Laterality Date  . ABDOMINAL HYSTERECTOMY      Family Psychiatric History: See below.  Family History:  Family History  Problem Relation Age of Onset  . Depression Sister     Social History:  Social History   Social History  . Marital status: Married    Spouse name: N/A  . Number of children: N/A  . Years of education: N/A   Social History Main Topics  . Smoking status: Current Every Day Smoker    Packs/day: 0.50    Years: 30.00    Types: Cigarettes  . Smokeless tobacco: Never Used  . Alcohol use No  . Drug use: No  . Sexual activity: Not Asked   Other Topics Concern  . None   Social History Narrative  . None    Allergies:   Allergies  Allergen Reactions  . Penicillins Rash    Metabolic Disorder Labs: No results found for: HGBA1C, MPG No results found for: PROLACTIN No results found for: CHOL, TRIG, HDL, CHOLHDL, VLDL, LDLCALC   Current Medications: Current Outpatient Prescriptions  Medication Sig Dispense Refill  . calcium carbonate (TUMS - DOSED IN MG ELEMENTAL CALCIUM) 500 MG chewable tablet Chew by mouth.    . esomeprazole (NEXIUM) 40 MG capsule 40 mg at bedtime.    Marland Kitchen ibuprofen (ADVIL,MOTRIN) 600 MG tablet Take 1 tablet (600 mg total) by mouth every 6 (six) hours as needed. 30 tablet 0  . PARoxetine (PAXIL) 40 MG tablet Take 1 tablet (40 mg total) by mouth every morning. 90 tablet 1  . terbinafine (LAMISIL) 250 MG tablet 250 mg.    . traZODone (DESYREL) 50 MG tablet Take 0.5 tablets (25 mg total) by mouth at bedtime as needed for sleep. 45 tablet 1   No current facility-administered medications for this visit.     Neurologic: Headache: No Seizure: No Paresthesias: No  Musculoskeletal: Strength & Muscle Tone: within normal limits Gait & Station: normal Patient leans: N/A  Psychiatric Specialty Exam: ROS  Blood pressure 110/68, pulse 64, height 5\' 4"  (1.626 m), weight 177 lb 3.2 oz (80.4 kg).Body mass index is 30.42  kg/m.  General Appearance: Casual  Eye Contact:  Good  Speech:  Normal Rate  Volume:  Normal  Mood:  Anxious  Affect:  Appropriate  Thought Process:  Goal Directed  Orientation:  Full (Time, Place, and Person)  Thought Content: WDL and Logical   Suicidal Thoughts:  No  Homicidal Thoughts:  No  Memory:  Immediate;   Good Recent;   Good Remote;   Good  Judgement:  Good  Insight:  Good  Psychomotor Activity:  Normal  Concentration:  Concentration: Good and Attention Span: Good  Recall:  Good  Fund of Knowledge: Good  Language: Good  Akathisia:  No  Handed:  Right  AIMS (if indicated):  None reported   Assets:  Communication Skills Desire for  Improvement Financial Resources/Insurance Housing Physical Health Social Support Transportation  ADL's:  Intact  Cognition: WNL  Sleep:  Good      Treatment Plan Summary:Medication management and Plan Patient depression is a stable on Paxil 40 mg and trazodone 25 mg at bedtime.  Discussed medication side effects and benefits.  Recommended to call us back if she has any question, concern if she feels worsening of the symptom.  Follow-up in 6 months.  Recommended to have her blood work faxed to [place] when she see her primary care physician.   Melissa Jeanty T., MD 04/26/2016, 8:50 AM

## 2016-08-18 ENCOUNTER — Emergency Department (HOSPITAL_COMMUNITY)
Admission: EM | Admit: 2016-08-18 | Discharge: 2016-08-18 | Disposition: A | Discharge status: Home / Self Care | Payer: Managed Care, Other (non HMO) | Attending: Emergency Medicine | Admitting: Emergency Medicine

## 2016-08-18 ENCOUNTER — Encounter (HOSPITAL_COMMUNITY): Payer: Self-pay

## 2016-08-18 ENCOUNTER — Emergency Department (HOSPITAL_COMMUNITY): Payer: Managed Care, Other (non HMO)

## 2016-08-18 DIAGNOSIS — R1031 Right lower quadrant pain: Secondary | ICD-10-CM | POA: Diagnosis not present

## 2016-08-18 DIAGNOSIS — F17210 Nicotine dependence, cigarettes, uncomplicated: Secondary | ICD-10-CM | POA: Diagnosis not present

## 2016-08-18 DIAGNOSIS — Z79899 Other long term (current) drug therapy: Secondary | ICD-10-CM | POA: Diagnosis not present

## 2016-08-18 DIAGNOSIS — R109 Unspecified abdominal pain: Secondary | ICD-10-CM

## 2016-08-18 LAB — URINALYSIS, ROUTINE W REFLEX MICROSCOPIC
Bilirubin Urine: NEGATIVE
Glucose, UA: NEGATIVE mg/dL
Hgb urine dipstick: NEGATIVE
KETONES UR: NEGATIVE mg/dL
LEUKOCYTES UA: NEGATIVE
Nitrite: NEGATIVE
PH: 6 (ref 5.0–8.0)
PROTEIN: NEGATIVE mg/dL
Specific Gravity, Urine: 1.008 (ref 1.005–1.030)

## 2016-08-18 LAB — COMPREHENSIVE METABOLIC PANEL
ALT: 20 U/L (ref 14–54)
ANION GAP: 10 (ref 5–15)
AST: 22 U/L (ref 15–41)
Albumin: 4.2 g/dL (ref 3.5–5.0)
Alkaline Phosphatase: 73 U/L (ref 38–126)
BILIRUBIN TOTAL: 0.3 mg/dL (ref 0.3–1.2)
BUN: 10 mg/dL (ref 6–20)
CO2: 25 mmol/L (ref 22–32)
Calcium: 9.4 mg/dL (ref 8.9–10.3)
Chloride: 103 mmol/L (ref 101–111)
Creatinine, Ser: 0.58 mg/dL (ref 0.44–1.00)
Glucose, Bld: 109 mg/dL — ABNORMAL HIGH (ref 65–99)
POTASSIUM: 3.9 mmol/L (ref 3.5–5.1)
Sodium: 138 mmol/L (ref 135–145)
Total Protein: 7.6 g/dL (ref 6.5–8.1)

## 2016-08-18 LAB — CBC
HEMATOCRIT: 41.8 % (ref 36.0–46.0)
Hemoglobin: 13.8 g/dL (ref 12.0–15.0)
MCH: 29.6 pg (ref 26.0–34.0)
MCHC: 33 g/dL (ref 30.0–36.0)
MCV: 89.5 fL (ref 78.0–100.0)
Platelets: 181 10*3/uL (ref 150–400)
RBC: 4.67 MIL/uL (ref 3.87–5.11)
RDW: 13.4 % (ref 11.5–15.5)
WBC: 5.6 10*3/uL (ref 4.0–10.5)

## 2016-08-18 LAB — LIPASE, BLOOD: Lipase: 29 U/L (ref 11–51)

## 2016-08-18 MED ORDER — ONDANSETRON 4 MG PO TBDP: 4.0000 mg | ORAL_TABLET | Freq: Once | ORAL | Status: DC | PRN

## 2016-08-18 MED ORDER — ONDANSETRON HCL 4 MG PO TABS: 4.0000 mg | ORAL_TABLET | Freq: Four times a day (QID) | ORAL | 0 refills | Status: DC

## 2016-08-18 NOTE — ED Provider Notes (Signed)
[facility] DEPT Provider Note   CSN: [id number] Arrival date & time: 08/18/16  0030     History   Chief Complaint Chief Complaint  Patient presents with  . Abdominal Pain    HPI Melissa Case is a 56 y.o. female.  Patient presents with complaint of right upper and lower abdominal pain since yesterday. She reports nausea without vomiting. No diarrhea or fever. No cough, congestion, myalgias. She had a normal appetite yesterday and was able to eat and drink as usual. No urinary symptoms or pelvic pain.   The history is provided by the patient. No language interpreter was used.  Abdominal Pain   Associated symptoms include nausea. Pertinent negatives include fever, diarrhea, vomiting, dysuria, frequency and myalgias.    Past Medical History:  Diagnosis Date  . Depression   . GERD (gastroesophageal reflux disease)     Patient Active Problem List   Diagnosis Date Noted  . Abdominal pain 09/30/2013  . SBO (small bowel obstruction) 09/30/2013  . 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 Gall stone 04/05/2012  . Depression 09/15/2011    Past Surgical History:  Procedure Laterality Date  . ABDOMINAL HYSTERECTOMY      OB History    No data available       Home Medications    Prior to Admission medications   Medication Sig Start Date End Date Taking? Authorizing Provider  esomeprazole (NEXIUM) 40 MG capsule 40 mg at bedtime. 05/10/15  Yes Andree Elk Smothers, NP  PARoxetine (PAXIL) 40 MG tablet Take 1 tablet (40 mg total) by mouth every morning. 04/26/16  Yes Kathlee Nations, MD  Phenylephrine-DM-GG-APAP (MUCINEX FAST-MAX COLD FLU) 5-10-200-325 MG TABS Take 2 capsules by mouth every 4 (four) hours as needed (cold).   Yes Historical Provider, MD  ibuprofen (ADVIL,MOTRIN) 600 MG tablet Take 1 tablet (600 mg total) by mouth every 6 (six) hours as needed. Patient not taking: Reported on 08/18/2016 09/16/14   Merryl Hacker, MD  traZODone (DESYREL) 50 MG tablet Take 0.5 tablets  (25 mg total) by mouth at bedtime as needed for sleep. Patient not taking: Reported on 08/18/2016 04/26/16   Kathlee Nations, MD    Family History Family History  Problem Relation Age of Onset  . Depression Sister     Social History Social History  Substance Use Topics  . Smoking status: Current Every Day Smoker    Packs/day: 0.50    Years: 30.00    Types: Cigarettes  . Smokeless tobacco: Never Used  . Alcohol use No     Allergies   Penicillins   Review of Systems Review of Systems  Constitutional: Negative for appetite change, chills, diaphoresis and fever.  HENT: Negative.   Respiratory: Negative.   Cardiovascular: Negative.   Gastrointestinal: Positive for abdominal pain and nausea. Negative for diarrhea and vomiting.  Genitourinary: Negative.  Negative for dysuria and frequency.  Musculoskeletal: Negative.  Negative for back pain and myalgias.  Neurological: Negative.      Physical Exam Updated Vital Signs BP 111/72 (BP Location: Left Arm)   Pulse 85   Temp 98.8 F (37.1 C) (Oral)   Resp 17   Ht 5\' 4"  (1.626 m)   Wt 80.7 kg   SpO2 98%   BMI 30.55 kg/m   Physical Exam  Constitutional: She is oriented to person, place, and time. She appears well-developed and well-nourished.  HENT:  Head: Normocephalic.  Neck: Normal range of motion. Neck supple.  Cardiovascular: Normal rate and regular rhythm.  Pulmonary/Chest: Effort normal and breath sounds normal. She has no wheezes. She has no rales.  Abdominal: Soft. Bowel sounds are normal. She exhibits no distension. There is tenderness (Tender in right lower abdomen to deep palpation only. ). There is no rebound and no guarding.  Musculoskeletal: Normal range of motion.  Neurological: She is alert and oriented to person, place, and time.  Skin: Skin is warm and dry. No rash noted.  Psychiatric: She has a normal mood and affect.     ED Treatments / Results  Labs (all labs ordered are listed, but only  abnormal results are displayed) Labs Reviewed  COMPREHENSIVE METABOLIC PANEL - Abnormal; Notable for the following:       Result Value   Glucose, Bld 109 (*)    All other components within normal limits  URINALYSIS, ROUTINE W REFLEX MICROSCOPIC - Abnormal; Notable for the following:    Color, Urine STRAW (*)    All other components within normal limits  LIPASE, BLOOD  CBC   Results for orders placed or performed during the hospital encounter of 08/18/16  Lipase, blood  Result Value Ref Range   Lipase 29 11 - 51 U/L  Comprehensive metabolic panel  Result Value Ref Range   Sodium 138 135 - 145 mmol/L   Potassium 3.9 3.5 - 5.1 mmol/L   Chloride 103 101 - 111 mmol/L   CO2 25 22 - 32 mmol/L   Glucose, Bld 109 (H) 65 - 99 mg/dL   BUN 10 6 - 20 mg/dL   Creatinine, Ser 0.58 0.44 - 1.00 mg/dL   Calcium 9.4 8.9 - 10.3 mg/dL   Total Protein 7.6 6.5 - 8.1 g/dL   Albumin 4.2 3.5 - 5.0 g/dL   AST 22 15 - 41 U/L   ALT 20 14 - 54 U/L   Alkaline Phosphatase 73 38 - 126 U/L   Total Bilirubin 0.3 0.3 - 1.2 mg/dL   GFR calc non Af Amer >60 >60 mL/min   GFR calc Af Amer >60 >60 mL/min   Anion gap 10 5 - 15  CBC  Result Value Ref Range   WBC 5.6 4.0 - 10.5 K/uL   RBC 4.67 3.87 - 5.11 MIL/uL   Hemoglobin 13.8 12.0 - 15.0 g/dL   HCT 41.8 36.0 - 46.0 %   MCV 89.5 78.0 - 100.0 fL   MCH 29.6 26.0 - 34.0 pg   MCHC 33.0 30.0 - 36.0 g/dL   RDW 13.4 11.5 - 15.5 %   Platelets 181 150 - 400 K/uL  Urinalysis, Routine w reflex microscopic  Result Value Ref Range   Color, Urine STRAW (A) YELLOW   APPearance CLEAR CLEAR   Specific Gravity, Urine 1.008 1.005 - 1.030   pH 6.0 5.0 - 8.0   Glucose, UA NEGATIVE NEGATIVE mg/dL   Hgb urine dipstick NEGATIVE NEGATIVE   Bilirubin Urine NEGATIVE NEGATIVE   Ketones, ur NEGATIVE NEGATIVE mg/dL   Protein, ur NEGATIVE NEGATIVE mg/dL   Nitrite NEGATIVE NEGATIVE   Leukocytes, UA NEGATIVE NEGATIVE   Dg Abdomen Acute W/chest  Result Date: 08/18/2016 CLINICAL  DATA:  Right-sided abdominal pain for 4 days. EXAM: DG ABDOMEN ACUTE W/ 1V CHEST COMPARISON:  CT 01/31/2014 FINDINGS: The cardiomediastinal contours are normal. The lungs are clear. There is no free intra-abdominal air. No dilated bowel loops to suggest obstruction. Small volume of stool throughout the colon. No radiopaque calculi. Gallstones on prior CT are not well visualized radiographically. Retroperitoneal surgical clips. No acute osseous abnormalities  are seen. IMPRESSION: 1. Normal bowel gas pattern. No free air. Gallstones on prior CT are not well visualized radiographically. 2.  No acute pulmonary process. Electronically Signed   By: Jeb Levering M.D.   On: 08/18/2016 05:59    EKG  EKG Interpretation None       Radiology Dg Abdomen Acute W/chest  Result Date: 08/18/2016 CLINICAL DATA:  Right-sided abdominal pain for 4 days. EXAM: DG ABDOMEN ACUTE W/ 1V CHEST COMPARISON:  CT 01/31/2014 FINDINGS: The cardiomediastinal contours are normal. The lungs are clear. There is no free intra-abdominal air. No dilated bowel loops to suggest obstruction. Small volume of stool throughout the colon. No radiopaque calculi. Gallstones on prior CT are not well visualized radiographically. Retroperitoneal surgical clips. No acute osseous abnormalities are seen. IMPRESSION: 1. Normal bowel gas pattern. No free air. Gallstones on prior CT are not well visualized radiographically. 2.  No acute pulmonary process. Electronically Signed   By: Jeb Levering M.D.   On: 08/18/2016 05:59    Procedures Procedures (including critical care time)  Medications Ordered in ED Medications  ondansetron (ZOFRAN-ODT) disintegrating tablet 4 mg (not administered)     Initial Impression / Assessment and Plan / ED Course  I have reviewed the triage vital signs and the nursing notes.  Pertinent labs & imaging results that were available during my care of the patient were reviewed by me and considered in my medical  decision making (see chart for details).     Patient with 1 day of abdominal pain, and nausea. No fever, diarrhea. She reports at onset the pain was in both upper and lower abdomen, and now is located in the lower abdomen indicating improvement over time. No leukocytosis, anorexia, fever or diarrhea.   She is examined by Dr. Regenia Skeeter and found to be at low risk for appendicitis. Discussed CT scan of abdomen vs waiting 24-48 for development of other symptoms: fever, increasing pain, persistence of pain, or new concern. Patient would like to wait and declines CT scan now. Strict return precautions discussed.   Final Clinical Impressions(s) / ED Diagnoses   Final diagnoses:  None   1. Abdominal pain  New Prescriptions New Prescriptions   No medications on file     Charlann Lange, PA-C 08/18/16 0645    Sherwood Gambler, MD 08/19/16 [phone]

## 2016-08-18 NOTE — ED Triage Notes (Signed)
Pt c/o R sided abdominal pain and nausea since yesterday. She denies chest pain, SOB, diarrhea, or emesis. A&Ox4. Ambulatory.

## 2016-08-18 NOTE — Discharge Instructions (Signed)
Take Tylenol for pain and Zofran for nausea as needed. Please return to the emergency department if pain worsens or if you develop new symptoms of concern for further evaluation by CT scan.

## 2016-10-25 ENCOUNTER — Ambulatory Visit (HOSPITAL_COMMUNITY): Payer: Self-pay | Admitting: Psychiatry

## 2016-10-26 ENCOUNTER — Telehealth (HOSPITAL_COMMUNITY): Payer: Self-pay

## 2016-10-26 DIAGNOSIS — F330 Major depressive disorder, recurrent, mild: Secondary | ICD-10-CM

## 2016-10-26 MED ORDER — PAROXETINE HCL 40 MG PO TABS: 40.0000 mg | ORAL_TABLET | ORAL | 0 refills | Status: DC

## 2016-10-26 NOTE — Telephone Encounter (Signed)
Medication refill request - Fax received from [facility] for a refill of pt's prescribed Paxil. Met with Dr. Adele Schilder who only approved a one time 30 day order for the medication due to pt. missed appointment 10/25/16 and needs to be seen for further refills. A new 30 day order for patient's prescribed Paxil 40 mg, 1 a day,, #30 with no refills e-scribed to [facility].

## 2016-10-27 MED ORDER — PAROXETINE HCL 40 MG PO TABS: 40.0000 mg | ORAL_TABLET | ORAL | 0 refills | Status: DC

## 2016-10-27 NOTE — Telephone Encounter (Signed)
Medication management - Another fax received from [facility] for a refill of patient's Paxil.  Reviewed order from 10/26/16 as this was sent to the wrong pharmacy and verified okay to send to correct pharmacy with Dr. Adele Schilder.  New 1 time 30 day order e-scribed to [facility] and called [facility] on [facility] with Maudie Mercury, pharmacist to cancel order that was sent incorrectly on 10/26/16.

## 2016-12-01 ENCOUNTER — Encounter (HOSPITAL_COMMUNITY): Payer: Self-pay | Admitting: Psychiatry

## 2016-12-01 ENCOUNTER — Ambulatory Visit (INDEPENDENT_AMBULATORY_CARE_PROVIDER_SITE_OTHER): Payer: 59 | Admitting: Psychiatry

## 2016-12-01 DIAGNOSIS — F330 Major depressive disorder, recurrent, mild: Secondary | ICD-10-CM | POA: Diagnosis not present

## 2016-12-01 DIAGNOSIS — Z818 Family history of other mental and behavioral disorders: Secondary | ICD-10-CM | POA: Diagnosis not present

## 2016-12-01 DIAGNOSIS — F17210 Nicotine dependence, cigarettes, uncomplicated: Secondary | ICD-10-CM | POA: Diagnosis not present

## 2016-12-01 MED ORDER — PAROXETINE HCL 40 MG PO TABS: 40.0000 mg | ORAL_TABLET | ORAL | 1 refills | Status: DC

## 2016-12-01 NOTE — Progress Notes (Signed)
[facility] MD/PA/NP OP Progress Note  12/01/2016 9:20 AM Melissa Case  MRN:  [id number]   Chief Complaint:  Subjective:  I am doing fine.  HPI: Melissa Case came for her follow-up appointment.  She is taking Paxil 40 mg daily.  She stopped trazodone because she sleeping good.  Overall she describes her mood is good.  She denies any irritability, anger, mania, psychosis or any hallucination.  She was last seen 6 months ago.  She reported her job is busy.  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  She's been working for more than a year.  She lives with her husband who is very supportive.  Patient is happy that her family issue is slowly resolving.  She has two son who lives close by and she see them on and off.  Her energy level is good.  Patient denies any suicidal thoughts or homicidal thought.  She denies any feeling of hopelessness or worthlessness.  Her energy level is good.  Patient denies any drinking or using any illegal substances.  She has not seen her primary care physician but trying to get appointment to see Dr. Theodis Aguas for physical checkup and routine blood work.  Visit Diagnosis:    ICD-10-CM   1. Mild episode of recurrent major depressive disorder (HCC) F33.0 PARoxetine (PAXIL) 40 MG tablet    Past Psychiatric History: Reviewed. Patient denies any history of psychiatric inpatient treatment, suicidal attempt, psychosis or any hallucination.  She has depression and anxiety for many years.  In the past she was given trazodone with good response but she stopped after her sleep is improved.  Past Medical History:  Past Medical History:  Diagnosis Date  . Depression   . GERD (gastroesophageal reflux disease)     Past Surgical History:  Procedure Laterality Date  . ABDOMINAL HYSTERECTOMY      Family Psychiatric History: Reviewed.  Family History:  Family History  Problem Relation Age of Onset  . Depression Sister     Social History:  Social History   Social  History  . Marital status: Married    Spouse name: N/A  . Number of children: N/A  . Years of education: N/A   Social History Main Topics  . Smoking status: Current Every Day Smoker    Packs/day: 0.50    Years: 30.00    Types: Cigarettes  . Smokeless tobacco: Never Used  . Alcohol use No  . Drug use: No  . Sexual activity: Not on file   Other Topics Concern  . Not on file   Social History Narrative  . No narrative on file    Allergies:  Allergies  Allergen Reactions  . Penicillins Rash    Has patient had a PCN reaction causing immediate rash, facial/tongue/throat swelling, SOB or lightheadedness with hypotension: No Has patient had a PCN reaction causing severe rash involving mucus membranes or skin necrosis: No Has patient had a PCN reaction that required hospitalization No Has patient had a PCN reaction occurring within the last 10 years: No If all of the above answers are "NO", then may proceed with Cephalosporin use.     Metabolic Disorder Labs: No results found for: HGBA1C, MPG No results found for: PROLACTIN No results found for: CHOL, TRIG, HDL, CHOLHDL, VLDL, LDLCALC   Current Medications: Current Outpatient Prescriptions  Medication Sig Dispense Refill  . esomeprazole (NEXIUM) 40 MG capsule 40 mg at bedtime.    Marland Kitchen ibuprofen (ADVIL,MOTRIN) 600 MG tablet Take 1 tablet (  600 mg total) by mouth every 6 (six) hours as needed. (Patient not taking: Reported on 08/18/2016) 30 tablet 0  . ondansetron (ZOFRAN) 4 MG tablet Take 1 tablet (4 mg total) by mouth every 6 (six) hours. 12 tablet 0  . PARoxetine (PAXIL) 40 MG tablet Take 1 tablet (40 mg total) by mouth every morning. 30 tablet 0  . Phenylephrine-DM-GG-APAP (MUCINEX FAST-MAX COLD FLU) 5-10-200-325 MG TABS Take 2 capsules by mouth every 4 (four) hours as needed (cold).    . traZODone (DESYREL) 50 MG tablet Take 0.5 tablets (25 mg total) by mouth at bedtime as needed for sleep. (Patient not taking: Reported on  08/18/2016) 45 tablet 1   No current facility-administered medications for this visit.     Neurologic: Headache: No Seizure: No Paresthesias: No  Musculoskeletal: Strength & Muscle Tone: within normal limits Gait & Station: normal Patient leans: N/A  Psychiatric Specialty Exam: ROS  Blood pressure 126/74, pulse 64, height 5\' 4"  (1.626 m), weight 178 lb (80.7 kg).There is no height or weight on file to calculate BMI.  General Appearance: Casual  Eye Contact:  Good  Speech:  Clear and Coherent  Volume:  Normal  Mood:  Euthymic  Affect:  Congruent  Thought Process:  Goal Directed  Orientation:  Full (Time, Place, and Person)  Thought Content: Logical   Suicidal Thoughts:  No  Homicidal Thoughts:  No  Memory:  Immediate;   Good Recent;   Good Remote;   Good  Judgement:  Good  Insight:  Good  Psychomotor Activity:  Normal  Concentration:  Concentration: Good and Attention Span: Good  Recall:  Good  Fund of Knowledge: Good  Language: Good  Akathisia:  No  Handed:  Right  AIMS (if indicated):  0  Assets:  Communication Skills Desire for [place] Talents/Skills  ADL's:  Intact  Cognition: WNL  Sleep:  good    Assessment: Major depressive disorder, recurrent mild.  Plan: Patient is a stable on Paxil 40 mg.  She is no longer taking trazodone.  She has no side effects including tremors shakes or any EPS.  Recommended to call us back if she has any question, concern or if she feels worsening of the symptom.  Follow-up in 6 months.  I encouraged to see primary care physician for physical and blood work.  Doran Nestle T., MD 12/01/2016, 9:20 AM

## 2017-05-25 ENCOUNTER — Ambulatory Visit (HOSPITAL_COMMUNITY): Payer: 59 | Admitting: Psychiatry

## 2017-06-27 ENCOUNTER — Other Ambulatory Visit (HOSPITAL_COMMUNITY): Payer: Self-pay | Admitting: Psychiatry

## 2017-06-27 DIAGNOSIS — F330 Major depressive disorder, recurrent, mild: Secondary | ICD-10-CM

## 2017-07-09 ENCOUNTER — Other Ambulatory Visit (HOSPITAL_COMMUNITY): Payer: Self-pay | Admitting: Psychiatry

## 2017-07-23 ENCOUNTER — Ambulatory Visit (INDEPENDENT_AMBULATORY_CARE_PROVIDER_SITE_OTHER): Payer: 59 | Admitting: Psychiatry

## 2017-07-23 ENCOUNTER — Encounter (HOSPITAL_COMMUNITY): Payer: Self-pay | Admitting: Psychiatry

## 2017-07-23 VITALS — BP 132/74 | HR 62 | Ht 64.0 in | Wt 182.0 lb

## 2017-07-23 DIAGNOSIS — Z79899 Other long term (current) drug therapy: Secondary | ICD-10-CM | POA: Diagnosis not present

## 2017-07-23 DIAGNOSIS — F17210 Nicotine dependence, cigarettes, uncomplicated: Secondary | ICD-10-CM | POA: Diagnosis not present

## 2017-07-23 DIAGNOSIS — Z818 Family history of other mental and behavioral disorders: Secondary | ICD-10-CM | POA: Diagnosis not present

## 2017-07-23 DIAGNOSIS — G4700 Insomnia, unspecified: Secondary | ICD-10-CM | POA: Diagnosis not present

## 2017-07-23 DIAGNOSIS — F330 Major depressive disorder, recurrent, mild: Secondary | ICD-10-CM | POA: Diagnosis not present

## 2017-07-23 MED ORDER — PAROXETINE HCL 40 MG PO TABS: 40.0000 mg | ORAL_TABLET | Freq: Every morning | ORAL | 1 refills | Status: DC

## 2017-07-23 NOTE — Addendum Note (Signed)
Addended by: Dennie Maizes E on: 07/23/2017 08:30 AM   Modules accepted: Orders

## 2017-07-23 NOTE — Progress Notes (Signed)
[facility] MD/PA/NP OP Progress Note  07/23/2017 8:09 AM Melissa Case  MRN:  [id number]  Chief Complaint: I am doing fine.  I am taking my medication.   HPI: Melissa Case came for her follow-up appointment.  She is compliant with Paxil 40 mg daily.  She is sleeping good.  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  She denies any panic attack or any crying spells.  Her sleep is good.  She denies any irritability, anger, mania or any psychosis.  She lives with her husband who is very supportive.  She is happy as yesterday she was able to see her 77-year-old grandchild.  Her energy level is good.  She has no tremors, shakes or any EPS.  She denies any drinking or using any illegal substances.  Patient apologized not having blood work for a while but agreed to have blood work today.  Patient admitted gained weight few pounds since the last visit as she is not doing exercise and watching her calorie intake.  Visit Diagnosis:    ICD-10-CM   1. Mild episode of recurrent major depressive disorder (HCC) F33.0 PARoxetine (PAXIL) 40 MG tablet    Past Psychiatric History: Reviewed. Patient denies any history of psychiatric inpatient treatment, suicidal attempt, psychosis or any hallucination.  She has depression and anxiety for many years.  In the past she was given trazodone with good response but she stopped after her sleep is improved.  Past Medical History:  Past Medical History:  Diagnosis Date  . Depression   . GERD (gastroesophageal reflux disease)     Past Surgical History:  Procedure Laterality Date  . ABDOMINAL HYSTERECTOMY      Family Psychiatric History: Reviewed.  Family History:  Family History  Problem Relation Age of Onset  . Depression Sister     Social History:  Social History   Socioeconomic History  . Marital status: Married    Spouse name: Not on file  . Number of children: Not on file  . Years of education: Not on file  . Highest education level: Not on file   Social Needs  . Financial resource strain: Not on file  . Food insecurity - worry: Not on file  . Food insecurity - inability: Not on file  . Transportation needs - medical: Not on file  . Transportation needs - non-medical: Not on file  Occupational History  . Not on file  Tobacco Use  . Smoking status: Current Every Day Smoker    Packs/day: 0.50    Years: 30.00    Pack years: 15.00    Types: Cigarettes  . Smokeless tobacco: Never Used  Substance and Sexual Activity  . Alcohol use: No    Alcohol/week: 0.0 oz  . Drug use: No  . Sexual activity: Not on file  Other Topics Concern  . Not on file  Social History Narrative  . Not on file    Allergies:  Allergies  Allergen Reactions  . Penicillins Rash    Has patient had a PCN reaction causing immediate rash, facial/tongue/throat swelling, SOB or lightheadedness with hypotension: No Has patient had a PCN reaction causing severe rash involving mucus membranes or skin necrosis: No Has patient had a PCN reaction that required hospitalization No Has patient had a PCN reaction occurring within the last 10 years: No If all of the above answers are "NO", then may proceed with Cephalosporin use.     Metabolic Disorder Labs: No results found for: HGBA1C, MPG  No results found for: PROLACTIN No results found for: CHOL, TRIG, HDL, CHOLHDL, VLDL, LDLCALC No results found for: TSH  Therapeutic Level Labs: No results found for: LITHIUM No results found for: VALPROATE No components found for:  CBMZ  Current Medications: Current Outpatient Medications  Medication Sig Dispense Refill  . esomeprazole (NEXIUM) 40 MG capsule 40 mg at bedtime.    Marland Kitchen PARoxetine (PAXIL) 40 MG tablet TAKE 1 TABLET BY MOUTH EVERY MORNING 30 tablet 0   No current facility-administered medications for this visit.      Musculoskeletal: Strength & Muscle Tone: within normal limits Gait & Station: normal Patient leans: N/A  Psychiatric Specialty  Exam: ROS  Blood pressure 132/74, pulse 62, height 5\' 4"  (1.626 m), weight 182 lb (82.6 kg).Body mass index is 31.24 kg/m.  General Appearance: Casual  Eye Contact:  Good  Speech:  Clear and Coherent  Volume:  Normal  Mood:  Euthymic  Affect:  Appropriate  Thought Process:  Goal Directed  Orientation:  Full (Time, Place, and Person)  Thought Content: Logical   Suicidal Thoughts:  No  Homicidal Thoughts:  No  Memory:  Immediate;   Good Recent;   Good Remote;   Good  Judgement:  Good  Insight:  Good  Psychomotor Activity:  Normal  Concentration:  Concentration: Good and Attention Span: Good  Recall:  Good  Fund of Knowledge: Good  Language: Good  Akathisia:  No  Handed:  Right  AIMS (if indicated): not done  Assets:  Communication Skills Desire for Improvement Physical Health Resilience Social Support  ADL's:  Intact  Cognition: WNL  Sleep:  Good   Screenings:   Assessment and Plan: Major depressive disorder, recurrent.  Patient is a stable on Paxil 40 mg daily.  She is no longer taking trazodone.  Her sleep is improved.  She has no tremors, shakes, EPS or any other concerns.  She is not interested in counseling.  Continue Paxil 40 mg daily.  Current healthy lifestyle and watch her calorie intake and do regular exercise.  We will do blood work including hemoglobin A1c, CBC, CMP, TSH.  Recommended to call us back if she has any question, concern if she feels worsening of the symptoms.  Follow-up in 6 months.   Kathlee Nations, MD 07/23/2017, 8:09 AM

## 2017-07-24 LAB — COMPREHENSIVE METABOLIC PANEL
ALT: 18 IU/L (ref 0–32)
AST: 23 IU/L (ref 0–40)
Albumin/Globulin Ratio: 1.7 (ref 1.2–2.2)
Albumin: 4.4 g/dL (ref 3.5–5.5)
Alkaline Phosphatase: 86 IU/L (ref 39–117)
BUN/Creatinine Ratio: 18 (ref 9–23)
BUN: 11 mg/dL (ref 6–24)
Bilirubin Total: 0.2 mg/dL (ref 0.0–1.2)
CO2: 22 mmol/L (ref 20–29)
CREATININE: 0.6 mg/dL (ref 0.57–1.00)
Calcium: 9.6 mg/dL (ref 8.7–10.2)
Chloride: 103 mmol/L (ref 96–106)
GFR calc Af Amer: 118 mL/min/{1.73_m2} (ref >59)
GFR calc non Af Amer: 102 mL/min/{1.73_m2} (ref >59)
GLOBULIN, TOTAL: 2.6 g/dL (ref 1.5–4.5)
Glucose: 113 mg/dL — ABNORMAL HIGH (ref 65–99)
Potassium: 5 mmol/L (ref 3.5–5.2)
SODIUM: 140 mmol/L (ref 134–144)
Total Protein: 7 g/dL (ref 6.0–8.5)

## 2017-07-24 LAB — CBC WITH DIFFERENTIAL/PLATELET
BASOS: 1 %
Basophils Absolute: 0.1 10*3/uL (ref 0.0–0.2)
EOS (ABSOLUTE): 0.2 10*3/uL (ref 0.0–0.4)
EOS: 2 %
HEMATOCRIT: 42.7 % (ref 34.0–46.6)
HEMOGLOBIN: 14.3 g/dL (ref 11.1–15.9)
IMMATURE GRANS (ABS): 0 10*3/uL (ref 0.0–0.1)
IMMATURE GRANULOCYTES: 0 %
Lymphocytes Absolute: 2.5 10*3/uL (ref 0.7–3.1)
Lymphs: 40 %
MCH: 29.6 pg (ref 26.6–33.0)
MCHC: 33.5 g/dL (ref 31.5–35.7)
MCV: 88 fL (ref 79–97)
MONOCYTES: 5 %
Monocytes Absolute: 0.3 10*3/uL (ref 0.1–0.9)
NEUTROS PCT: 52 %
Neutrophils Absolute: 3.3 10*3/uL (ref 1.4–7.0)
Platelets: 240 10*3/uL (ref 150–379)
RBC: 4.83 x10E6/uL (ref 3.77–5.28)
RDW: 13.6 % (ref 12.3–15.4)
WBC: 6.2 10*3/uL (ref 3.4–10.8)

## 2017-07-24 LAB — HEMOGLOBIN A1C
ESTIMATED AVERAGE GLUCOSE: 123 mg/dL
Hgb A1c MFr Bld: 5.9 % — ABNORMAL HIGH (ref 4.8–5.6)

## 2017-07-24 LAB — TSH: TSH: 0.934 u[IU]/mL (ref 0.450–4.500)

## 2017-11-19 ENCOUNTER — Emergency Department (HOSPITAL_COMMUNITY)
Admission: EM | Admit: 2017-11-19 | Discharge: 2017-11-19 | Disposition: A | Discharge status: Home / Self Care | Payer: Managed Care, Other (non HMO) | Source: Home / Self Care | Attending: Emergency Medicine | Admitting: Emergency Medicine

## 2017-11-19 ENCOUNTER — Emergency Department (HOSPITAL_COMMUNITY): Payer: Managed Care, Other (non HMO)

## 2017-11-19 ENCOUNTER — Other Ambulatory Visit: Payer: Self-pay

## 2017-11-19 ENCOUNTER — Encounter (HOSPITAL_COMMUNITY): Payer: Self-pay | Admitting: Obstetrics and Gynecology

## 2017-11-19 DIAGNOSIS — F17210 Nicotine dependence, cigarettes, uncomplicated: Secondary | ICD-10-CM

## 2017-11-19 DIAGNOSIS — Z79899 Other long term (current) drug therapy: Secondary | ICD-10-CM

## 2017-11-19 DIAGNOSIS — K8000 Calculus of gallbladder with acute cholecystitis without obstruction: Secondary | ICD-10-CM | POA: Diagnosis not present

## 2017-11-19 DIAGNOSIS — K8020 Calculus of gallbladder without cholecystitis without obstruction: Secondary | ICD-10-CM

## 2017-11-19 DIAGNOSIS — R1011 Right upper quadrant pain: Secondary | ICD-10-CM

## 2017-11-19 LAB — URINALYSIS, ROUTINE W REFLEX MICROSCOPIC
Bilirubin Urine: NEGATIVE
Glucose, UA: NEGATIVE mg/dL
Ketones, ur: NEGATIVE mg/dL
Leukocytes, UA: NEGATIVE
Nitrite: NEGATIVE
Protein, ur: NEGATIVE mg/dL
Specific Gravity, Urine: 1.027 (ref 1.005–1.030)
pH: 5 (ref 5.0–8.0)

## 2017-11-19 LAB — CBC
HEMATOCRIT: 43 % (ref 36.0–46.0)
HEMOGLOBIN: 14.3 g/dL (ref 12.0–15.0)
MCH: 29.8 pg (ref 26.0–34.0)
MCHC: 33.3 g/dL (ref 30.0–36.0)
MCV: 89.6 fL (ref 78.0–100.0)
Platelets: 269 10*3/uL (ref 150–400)
RBC: 4.8 MIL/uL (ref 3.87–5.11)
RDW: 13.8 % (ref 11.5–15.5)
WBC: 14.6 10*3/uL — ABNORMAL HIGH (ref 4.0–10.5)

## 2017-11-19 LAB — COMPREHENSIVE METABOLIC PANEL
ALBUMIN: 3.9 g/dL (ref 3.5–5.0)
ALK PHOS: 86 U/L (ref 38–126)
ALT: 21 U/L (ref 0–44)
AST: 20 U/L (ref 15–41)
Anion gap: 11 (ref 5–15)
BUN: 10 mg/dL (ref 6–20)
CALCIUM: 9.2 mg/dL (ref 8.9–10.3)
CO2: 25 mmol/L (ref 22–32)
Chloride: 104 mmol/L (ref 98–111)
Creatinine, Ser: 0.56 mg/dL (ref 0.44–1.00)
GFR calc Af Amer: >60 mL/min (ref >60)
GFR calc non Af Amer: >60 mL/min (ref >60)
GLUCOSE: 120 mg/dL — AB (ref 70–99)
POTASSIUM: 3.6 mmol/L (ref 3.5–5.1)
SODIUM: 140 mmol/L (ref 135–145)
Total Bilirubin: 0.4 mg/dL (ref 0.3–1.2)
Total Protein: 7.3 g/dL (ref 6.5–8.1)

## 2017-11-19 LAB — LIPASE, BLOOD: Lipase: 24 U/L (ref 11–51)

## 2017-11-19 MED ORDER — DICYCLOMINE HCL 20 MG PO TABS: 20.0000 mg | ORAL_TABLET | Freq: Two times a day (BID) | ORAL | 0 refills | Status: DC

## 2017-11-19 MED ORDER — SODIUM CHLORIDE 0.9 % IV BOLUS
1000.0000 mL | Freq: Once | INTRAVENOUS | Status: AC
  Administered 2017-11-19: 1000 mL via INTRAVENOUS

## 2017-11-19 MED ORDER — ONDANSETRON HCL 4 MG/2ML IJ SOLN
4.0000 mg | Freq: Once | INTRAMUSCULAR | Status: AC
  Administered 2017-11-19: 4 mg via INTRAVENOUS
  Filled 2017-11-19: qty 2

## 2017-11-19 MED ORDER — FAMOTIDINE 20 MG PO TABS: 20.0000 mg | ORAL_TABLET | Freq: Two times a day (BID) | ORAL | 0 refills | Status: DC

## 2017-11-19 MED ORDER — ONDANSETRON 4 MG PO TBDP: 4.0000 mg | ORAL_TABLET | Freq: Three times a day (TID) | ORAL | 0 refills | Status: DC | PRN

## 2017-11-19 MED ORDER — FAMOTIDINE IN NACL 20-0.9 MG/50ML-% IV SOLN
20.0000 mg | Freq: Once | INTRAVENOUS | Status: AC
  Administered 2017-11-19: 20 mg via INTRAVENOUS
  Filled 2017-11-19: qty 50

## 2017-11-19 NOTE — Discharge Instructions (Signed)
Follow-up with the surgeon listed below for further evaluation. Take the medication as needed for discomfort. Return to ED for worsening symptoms, severe abdominal pain and vomiting, vomiting up blood, fever.

## 2017-11-19 NOTE — ED Provider Notes (Signed)
[facility] DEPT Provider Note   CSN: [id number] Arrival date & time: 11/19/17  1854     History   Chief Complaint Chief Complaint  Patient presents with  . Abdominal Pain  . Nausea    HPI Melissa Case is a 57 y.o. female with a past medical history of GERD, who presents to ED for evaluation of 24-hour history of epigastric/right upper quadrant abdominal pain, nausea.  States that symptoms began after eating pizza and wings from [facility] last night.  Has been ate the same food with no symptoms.  No prior history of similar symptoms.  States that this feels different than her typical reflux.  She took 1 dose of Pepto-Bismol with improvement in her symptoms.  Had a normal bowel movement today.  Denies any changes to urination, fever, recent travel, hematemesis, hematochezia, melena, back pain, chest pain, shortness of breath.  Reports daily tobacco use but denies any alcohol or other drug use.  Denies chronic NSAID use.  States prior abdominal surgery includes total hysterectomy.  HPI  Past Medical History:  Diagnosis Date  . Depression   . GERD (gastroesophageal reflux disease)     Patient Active Problem List   Diagnosis Date Noted  . Abdominal pain 09/30/2013  . SBO (small bowel obstruction) ([place]) 09/30/2013  . 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 Gall stone 04/05/2012  . Depression 09/15/2011    Past Surgical History:  Procedure Laterality Date  . ABDOMINAL HYSTERECTOMY       OB History   None      Home Medications    Prior to Admission medications   Medication Sig Start Date End Date Taking? Authorizing Provider  esomeprazole (NEXIUM) 40 MG capsule 40 mg at bedtime. 05/10/15  Yes Smothers, Andree Elk, NP  PARoxetine (PAXIL) 40 MG tablet Take 1 tablet (40 mg total) by mouth every morning. 07/23/17  Yes Arfeen, Arlyce Harman, MD  dicyclomine (BENTYL) 20 MG tablet Take 1 tablet (20 mg total) by mouth 2 (two) times daily. 11/19/17   Roger Kettles,  PA-C  famotidine (PEPCID) 20 MG tablet Take 1 tablet (20 mg total) by mouth 2 (two) times daily. 11/19/17   Karilyn Wind, PA-C  ondansetron (ZOFRAN ODT) 4 MG disintegrating tablet Take 1 tablet (4 mg total) by mouth every 8 (eight) hours as needed for nausea or vomiting. 11/19/17   Delia Heady, PA-C    Family History Family History  Problem Relation Age of Onset  . Depression Sister     Social History Social History   Tobacco Use  . Smoking status: Current Every Day Smoker    Packs/day: 0.50    Years: 30.00    Pack years: 15.00    Types: Cigarettes  . Smokeless tobacco: Never Used  Substance Use Topics  . Alcohol use: No    Alcohol/week: 0.0 oz  . Drug use: No     Allergies   Penicillins   Review of Systems Review of Systems  Constitutional: Negative for appetite change, chills and fever.  HENT: Negative for ear pain, rhinorrhea, sneezing and sore throat.   Eyes: Negative for photophobia and visual disturbance.  Respiratory: Negative for cough, chest tightness, shortness of breath and wheezing.   Cardiovascular: Negative for chest pain and palpitations.  Gastrointestinal: Positive for abdominal pain and nausea. Negative for blood in stool, constipation, diarrhea and vomiting.  Genitourinary: Negative for dysuria, hematuria and urgency.  Musculoskeletal: Negative for myalgias.  Skin: Negative for rash.  Neurological: Negative for dizziness,  weakness and light-headedness.     Physical Exam Updated Vital Signs BP (!) 150/72 (BP Location: Right Arm)   Pulse 80   Temp 98.9 F (37.2 C) (Oral)   Resp 18   Ht 5\' 4"  (1.626 m)   Wt 80.7 kg (178 lb)   SpO2 96%   BMI 30.55 kg/m   Physical Exam  Constitutional: She appears well-developed and well-nourished. No distress.  HENT:  Head: Normocephalic and atraumatic.  Nose: Nose normal.  Eyes: Conjunctivae and EOM are normal. Left eye exhibits no discharge. No scleral icterus.  Neck: Normal range of motion. Neck supple.    Cardiovascular: Normal rate, regular rhythm, normal heart sounds and intact distal pulses. Exam reveals no gallop and no friction rub.  No murmur heard. Pulmonary/Chest: Effort normal and breath sounds normal. No respiratory distress.  Abdominal: Soft. Bowel sounds are normal. She exhibits no distension. There is tenderness in the right upper quadrant and epigastric area. There is no rebound and no guarding.  Musculoskeletal: Normal range of motion. She exhibits no edema.  Neurological: She is alert. She exhibits normal muscle tone. Coordination normal.  Skin: Skin is warm and dry. No rash noted.  Psychiatric: She has a normal mood and affect.  Nursing note and vitals reviewed.    ED Treatments / Results  Labs (all labs ordered are listed, but only abnormal results are displayed) Labs Reviewed  COMPREHENSIVE METABOLIC PANEL - Abnormal; Notable for the following components:      Result Value   Glucose, Bld 120 (*)    All other components within normal limits  CBC - Abnormal; Notable for the following components:   WBC 14.6 (*)    All other components within normal limits  URINALYSIS, ROUTINE W REFLEX MICROSCOPIC - Abnormal; Notable for the following components:   APPearance HAZY (*)    Hgb urine dipstick MODERATE (*)    Bacteria, UA RARE (*)    All other components within normal limits  LIPASE, BLOOD    EKG None  Radiology US Abdomen Limited Ruq  Result Date: 11/19/2017 CLINICAL DATA:  Right upper quadrant pain EXAM: ULTRASOUND ABDOMEN LIMITED RIGHT UPPER QUADRANT COMPARISON:  08/18/2016 radiograph FINDINGS: Gallbladder: Gallstone measuring up to 2.3 cm. Wall thickness within normal limits. No sonographic Murphy. Common bile duct: Diameter: Upper normal at 6 mm Liver: No focal lesion identified. Within normal limits in parenchymal echogenicity. Portal vein is patent on color Doppler imaging with normal direction of blood flow towards the liver. IMPRESSION: Cholelithiasis without  sonographic evidence for acute cholecystitis or biliary dilatation Electronically Signed   By: Donavan Foil M.D.   On: 11/19/2017 21:43    Procedures Procedures (including critical care time)  Medications Ordered in ED Medications  sodium chloride 0.9 % bolus 1,000 mL (1,000 mLs Intravenous New Bag/Given 11/19/17 2110)  ondansetron (ZOFRAN) injection 4 mg (4 mg Intravenous Given 11/19/17 2110)  famotidine (PEPCID) IVPB 20 mg premix (20 mg Intravenous New Bag/Given 11/19/17 2110)     Initial Impression / Assessment and Plan / ED Course  I have reviewed the triage vital signs and the nursing notes.  Pertinent labs & imaging results that were available during my care of the patient were reviewed by me and considered in my medical decision making (see chart for details).     57 year old female with a past medical history of GERD presents for 24-hour history of epigastric/right upper quadrant abdominal pain, nausea.  States that symptoms began after eating pizza and wings last night.  Husband ate the same food with no symptoms.  No history of similar symptoms in the past.  Feels different than her typical reflux.  Reports some improvement with Pepto-Bismol taken prior to arrival.  Denies any fevers, chest pain, changes to urination, hematemesis, hematochezia or melena.  Physical exam she has right upper quadrant epigastric tenderness to palpation with no rebound or guarding noted.  She is afebrile.  Other vital signs are within normal limits.  Lab work significant for CBC showing leukocytosis at 14.6.  Urinalysis, lipase, CMP unremarkable.  Right upper quadrant ultrasound shows cholelithiasis with no evidence of cholecystitis.  Suspect that this is the cause of her symptoms, in addition to GERD.  Patient reports significant improvement and near complete resolution of her symptoms with fluids, Zofran and Pepcid given.  Will give number for general surgery for follow-up and advised to return to ED for any  severe worsening symptoms.  Portions of this note were generated with [employer]. Dictation errors may occur despite best attempts at proofreading.   Final Clinical Impressions(s) / ED Diagnoses   Final diagnoses:  RUQ discomfort  Calculus of gallbladder without cholecystitis without obstruction    ED Discharge Orders        Ordered    ondansetron (ZOFRAN ODT) 4 MG disintegrating tablet  Every 8 hours PRN     11/19/17 2157    famotidine (PEPCID) 20 MG tablet  2 times daily     11/19/17 2157    dicyclomine (BENTYL) 20 MG tablet  2 times daily     11/19/17 2157       Delia Heady, PA-C 11/19/17 2201    Davonna Belling, MD 11/19/17 2330

## 2017-11-20 ENCOUNTER — Inpatient Hospital Stay (HOSPITAL_COMMUNITY)
Admission: EM | Admit: 2017-11-20 | Discharge: 2017-11-22 | DRG: 419 | Disposition: A | Discharge status: Home / Self Care | Payer: Managed Care, Other (non HMO) | Attending: General Surgery | Admitting: General Surgery

## 2017-11-20 ENCOUNTER — Other Ambulatory Visit: Payer: Self-pay

## 2017-11-20 ENCOUNTER — Encounter (HOSPITAL_COMMUNITY): Payer: Self-pay | Admitting: Emergency Medicine

## 2017-11-20 DIAGNOSIS — Z90710 Acquired absence of both cervix and uterus: Secondary | ICD-10-CM

## 2017-11-20 DIAGNOSIS — K8000 Calculus of gallbladder with acute cholecystitis without obstruction: Secondary | ICD-10-CM | POA: Diagnosis present

## 2017-11-20 DIAGNOSIS — K8020 Calculus of gallbladder without cholecystitis without obstruction: Secondary | ICD-10-CM

## 2017-11-20 DIAGNOSIS — F17210 Nicotine dependence, cigarettes, uncomplicated: Secondary | ICD-10-CM | POA: Diagnosis present

## 2017-11-20 DIAGNOSIS — K219 Gastro-esophageal reflux disease without esophagitis: Secondary | ICD-10-CM | POA: Diagnosis present

## 2017-11-20 DIAGNOSIS — Z419 Encounter for procedure for purposes other than remedying health state, unspecified: Secondary | ICD-10-CM

## 2017-11-20 DIAGNOSIS — K8050 Calculus of bile duct without cholangitis or cholecystitis without obstruction: Secondary | ICD-10-CM

## 2017-11-20 LAB — CBC WITH DIFFERENTIAL/PLATELET
Basophils Absolute: 0.1 10*3/uL (ref 0.0–0.1)
Basophils Relative: 1 %
Eosinophils Absolute: 0.3 10*3/uL (ref 0.0–0.7)
Eosinophils Relative: 3 %
HEMATOCRIT: 41 % (ref 36.0–46.0)
HEMOGLOBIN: 13.5 g/dL (ref 12.0–15.0)
LYMPHS ABS: 3.2 10*3/uL (ref 0.7–4.0)
LYMPHS PCT: 29 %
MCH: 29.9 pg (ref 26.0–34.0)
MCHC: 32.9 g/dL (ref 30.0–36.0)
MCV: 90.7 fL (ref 78.0–100.0)
MONO ABS: 1.1 10*3/uL — AB (ref 0.1–1.0)
Monocytes Relative: 10 %
Neutro Abs: 6.3 10*3/uL (ref 1.7–7.7)
Neutrophils Relative %: 57 %
Platelets: 248 10*3/uL (ref 150–400)
RBC: 4.52 MIL/uL (ref 3.87–5.11)
RDW: 14 % (ref 11.5–15.5)
WBC: 10.9 10*3/uL — ABNORMAL HIGH (ref 4.0–10.5)

## 2017-11-20 LAB — BASIC METABOLIC PANEL
ANION GAP: 3 — AB (ref 5–15)
BUN: 10 mg/dL (ref 6–20)
CHLORIDE: 105 mmol/L (ref 98–111)
CO2: 34 mmol/L — AB (ref 22–32)
CREATININE: 0.62 mg/dL (ref 0.44–1.00)
Calcium: 9 mg/dL (ref 8.9–10.3)
GFR calc non Af Amer: >60 mL/min (ref >60)
Glucose, Bld: 104 mg/dL — ABNORMAL HIGH (ref 70–99)
Potassium: 3.9 mmol/L (ref 3.5–5.1)
Sodium: 142 mmol/L (ref 135–145)

## 2017-11-20 LAB — LIPASE, BLOOD: Lipase: 52 U/L — ABNORMAL HIGH (ref 11–51)

## 2017-11-20 NOTE — ED Triage Notes (Addendum)
Pt arriving with complaint of right sided abdominal pain. Pt states the pain extends from her right shoulder down to her lower abdomen. Pt was seen yesterday for same.

## 2017-11-21 ENCOUNTER — Inpatient Hospital Stay (HOSPITAL_COMMUNITY): Payer: Managed Care, Other (non HMO) | Admitting: Anesthesiology

## 2017-11-21 ENCOUNTER — Inpatient Hospital Stay (HOSPITAL_COMMUNITY): Payer: Managed Care, Other (non HMO)

## 2017-11-21 ENCOUNTER — Encounter (HOSPITAL_COMMUNITY): Admission: EM | Disposition: A | Discharge status: Home / Self Care | Payer: Self-pay | Source: Home / Self Care

## 2017-11-21 DIAGNOSIS — K8000 Calculus of gallbladder with acute cholecystitis without obstruction: Secondary | ICD-10-CM | POA: Diagnosis present

## 2017-11-21 DIAGNOSIS — F17210 Nicotine dependence, cigarettes, uncomplicated: Secondary | ICD-10-CM | POA: Diagnosis present

## 2017-11-21 DIAGNOSIS — Z90710 Acquired absence of both cervix and uterus: Secondary | ICD-10-CM | POA: Diagnosis not present

## 2017-11-21 DIAGNOSIS — R1011 Right upper quadrant pain: Secondary | ICD-10-CM | POA: Diagnosis present

## 2017-11-21 DIAGNOSIS — K219 Gastro-esophageal reflux disease without esophagitis: Secondary | ICD-10-CM | POA: Diagnosis present

## 2017-11-21 HISTORY — PX: CHOLECYSTECTOMY: SHX55

## 2017-11-21 LAB — CBC
HCT: 37.2 % (ref 36.0–46.0)
Hemoglobin: 12.1 g/dL (ref 12.0–15.0)
MCH: 29.8 pg (ref 26.0–34.0)
MCHC: 32.5 g/dL (ref 30.0–36.0)
MCV: 91.6 fL (ref 78.0–100.0)
PLATELETS: 210 10*3/uL (ref 150–400)
RBC: 4.06 MIL/uL (ref 3.87–5.11)
RDW: 14.1 % (ref 11.5–15.5)
WBC: 9.7 10*3/uL (ref 4.0–10.5)

## 2017-11-21 LAB — COMPREHENSIVE METABOLIC PANEL
ALK PHOS: 79 U/L (ref 38–126)
ALT: 29 U/L (ref 0–44)
AST: 31 U/L (ref 15–41)
Albumin: 3.3 g/dL — ABNORMAL LOW (ref 3.5–5.0)
Anion gap: 8 (ref 5–15)
BUN: 9 mg/dL (ref 6–20)
CALCIUM: 8.5 mg/dL — AB (ref 8.9–10.3)
CHLORIDE: 103 mmol/L (ref 98–111)
CO2: 28 mmol/L (ref 22–32)
CREATININE: 0.55 mg/dL (ref 0.44–1.00)
Glucose, Bld: 121 mg/dL — ABNORMAL HIGH (ref 70–99)
Potassium: 3.5 mmol/L (ref 3.5–5.1)
Sodium: 139 mmol/L (ref 135–145)
Total Bilirubin: 0.4 mg/dL (ref 0.3–1.2)
Total Protein: 6.3 g/dL — ABNORMAL LOW (ref 6.5–8.1)

## 2017-11-21 LAB — HEPATIC FUNCTION PANEL
ALBUMIN: 3.6 g/dL (ref 3.5–5.0)
ALT: 19 U/L (ref 0–44)
AST: 16 U/L (ref 15–41)
Alkaline Phosphatase: 76 U/L (ref 38–126)
Bilirubin, Direct: 0.1 mg/dL (ref 0.0–0.2)
Indirect Bilirubin: 0.6 mg/dL (ref 0.3–0.9)
TOTAL PROTEIN: 7.1 g/dL (ref 6.5–8.1)
Total Bilirubin: 0.7 mg/dL (ref 0.3–1.2)

## 2017-11-21 LAB — HIV ANTIBODY (ROUTINE TESTING W REFLEX): HIV SCREEN 4TH GENERATION: NONREACTIVE

## 2017-11-21 LAB — SURGICAL PCR SCREEN
MRSA, PCR: NEGATIVE
Staphylococcus aureus: NEGATIVE

## 2017-11-21 SURGERY — LAPAROSCOPIC CHOLECYSTECTOMY WITH INTRAOPERATIVE CHOLANGIOGRAM
Anesthesia: General | Site: Abdomen

## 2017-11-21 MED ORDER — ENOXAPARIN SODIUM 40 MG/0.4ML ~~LOC~~ SOLN: 40.0000 mg | SUBCUTANEOUS | Status: DC

## 2017-11-21 MED ORDER — DIPHENHYDRAMINE HCL 12.5 MG/5ML PO ELIX: 12.5000 mg | ORAL_SOLUTION | Freq: Four times a day (QID) | ORAL | Status: DC | PRN

## 2017-11-21 MED ORDER — ONDANSETRON HCL 4 MG/2ML IJ SOLN
INTRAMUSCULAR | Status: DC | PRN
  Administered 2017-11-21: 4 mg via INTRAVENOUS

## 2017-11-21 MED ORDER — PANTOPRAZOLE SODIUM 40 MG PO TBEC: 40.0000 mg | DELAYED_RELEASE_TABLET | Freq: Once | ORAL | Status: DC

## 2017-11-21 MED ORDER — PANTOPRAZOLE SODIUM 40 MG PO TBEC
40.0000 mg | DELAYED_RELEASE_TABLET | Freq: Every day | ORAL | Status: DC
  Administered 2017-11-22: 40 mg via ORAL
  Filled 2017-11-21: qty 1

## 2017-11-21 MED ORDER — FENTANYL CITRATE (PF) 250 MCG/5ML IJ SOLN
INTRAMUSCULAR | Status: AC
  Filled 2017-11-21: qty 5

## 2017-11-21 MED ORDER — PROMETHAZINE HCL 25 MG/ML IJ SOLN: 6.2500 mg | INTRAMUSCULAR | Status: DC | PRN

## 2017-11-21 MED ORDER — CIPROFLOXACIN IN D5W 400 MG/200ML IV SOLN
400.0000 mg | Freq: Two times a day (BID) | INTRAVENOUS | Status: DC
  Administered 2017-11-21 – 2017-11-22 (×4): 400 mg via INTRAVENOUS
  Filled 2017-11-21 (×4): qty 200

## 2017-11-21 MED ORDER — MEPERIDINE HCL 50 MG/ML IJ SOLN: 6.2500 mg | INTRAMUSCULAR | Status: DC | PRN

## 2017-11-21 MED ORDER — PROPOFOL 10 MG/ML IV BOLUS
INTRAVENOUS | Status: DC | PRN
  Administered 2017-11-21: 170 mg via INTRAVENOUS

## 2017-11-21 MED ORDER — HYDROMORPHONE HCL 1 MG/ML IJ SOLN: 0.2500 mg | INTRAMUSCULAR | Status: DC | PRN

## 2017-11-21 MED ORDER — SUGAMMADEX SODIUM 200 MG/2ML IV SOLN
INTRAVENOUS | Status: AC
  Filled 2017-11-21: qty 2

## 2017-11-21 MED ORDER — MORPHINE SULFATE (PF) 2 MG/ML IV SOLN
1.0000 mg | INTRAVENOUS | Status: DC | PRN
  Administered 2017-11-21: 1 mg via INTRAVENOUS
  Filled 2017-11-21: qty 1

## 2017-11-21 MED ORDER — SUGAMMADEX SODIUM 200 MG/2ML IV SOLN
INTRAVENOUS | Status: DC | PRN
  Administered 2017-11-21: 170 mg via INTRAVENOUS

## 2017-11-21 MED ORDER — DEXAMETHASONE SODIUM PHOSPHATE 10 MG/ML IJ SOLN
INTRAMUSCULAR | Status: AC
  Filled 2017-11-21: qty 1

## 2017-11-21 MED ORDER — IOPAMIDOL (ISOVUE-300) INJECTION 61%
INTRAVENOUS | Status: DC | PRN
  Administered 2017-11-21: 2.5 mL via INTRAVENOUS

## 2017-11-21 MED ORDER — SODIUM CHLORIDE 0.9 % IV BOLUS
1000.0000 mL | Freq: Once | INTRAVENOUS | Status: AC
  Administered 2017-11-21: 1000 mL via INTRAVENOUS

## 2017-11-21 MED ORDER — SIMETHICONE 80 MG PO CHEW: 40.0000 mg | CHEWABLE_TABLET | Freq: Four times a day (QID) | ORAL | Status: DC | PRN

## 2017-11-21 MED ORDER — DIPHENHYDRAMINE HCL 50 MG/ML IJ SOLN: 12.5000 mg | Freq: Four times a day (QID) | INTRAMUSCULAR | Status: DC | PRN

## 2017-11-21 MED ORDER — PROPOFOL 10 MG/ML IV BOLUS
INTRAVENOUS | Status: AC
  Filled 2017-11-21: qty 20

## 2017-11-21 MED ORDER — IOPAMIDOL (ISOVUE-300) INJECTION 61%
INTRAVENOUS | Status: AC
  Filled 2017-11-21: qty 50

## 2017-11-21 MED ORDER — LACTATED RINGERS IV SOLN
INTRAVENOUS | Status: DC
  Administered 2017-11-21: 15:00:00 via INTRAVENOUS

## 2017-11-21 MED ORDER — ROCURONIUM BROMIDE 50 MG/5ML IV SOSY
PREFILLED_SYRINGE | INTRAVENOUS | Status: DC | PRN
  Administered 2017-11-21: 50 mg via INTRAVENOUS

## 2017-11-21 MED ORDER — ACETAMINOPHEN 10 MG/ML IV SOLN: 1000.0000 mg | Freq: Once | INTRAVENOUS | Status: DC | PRN

## 2017-11-21 MED ORDER — BUPIVACAINE-EPINEPHRINE (PF) 0.25% -1:200000 IJ SOLN
INTRAMUSCULAR | Status: AC
  Filled 2017-11-21: qty 30

## 2017-11-21 MED ORDER — MIDAZOLAM HCL 2 MG/2ML IJ SOLN
INTRAMUSCULAR | Status: AC
  Filled 2017-11-21: qty 2

## 2017-11-21 MED ORDER — SUCCINYLCHOLINE CHLORIDE 200 MG/10ML IV SOSY
PREFILLED_SYRINGE | INTRAVENOUS | Status: AC
  Filled 2017-11-21: qty 10

## 2017-11-21 MED ORDER — DEXAMETHASONE SODIUM PHOSPHATE 10 MG/ML IJ SOLN
INTRAMUSCULAR | Status: DC | PRN
  Administered 2017-11-21: 10 mg via INTRAVENOUS

## 2017-11-21 MED ORDER — ROCURONIUM BROMIDE 100 MG/10ML IV SOLN
INTRAVENOUS | Status: AC
  Filled 2017-11-21: qty 1

## 2017-11-21 MED ORDER — LIDOCAINE 2% (20 MG/ML) 5 ML SYRINGE
INTRAMUSCULAR | Status: DC | PRN
  Administered 2017-11-21: 80 mg via INTRAVENOUS

## 2017-11-21 MED ORDER — TRAMADOL HCL 50 MG PO TABS
50.0000 mg | ORAL_TABLET | Freq: Four times a day (QID) | ORAL | Status: DC | PRN
  Administered 2017-11-22: 50 mg via ORAL
  Filled 2017-11-21: qty 1

## 2017-11-21 MED ORDER — PAROXETINE HCL 20 MG PO TABS
40.0000 mg | ORAL_TABLET | Freq: Every morning | ORAL | Status: DC
  Administered 2017-11-22: 40 mg via ORAL
  Filled 2017-11-21: qty 2

## 2017-11-21 MED ORDER — KCL IN DEXTROSE-NACL 20-5-0.45 MEQ/L-%-% IV SOLN
INTRAVENOUS | Status: DC
  Administered 2017-11-21 (×2): via INTRAVENOUS
  Filled 2017-11-21 (×3): qty 1000

## 2017-11-21 MED ORDER — SENNA 8.6 MG PO TABS
1.0000 | ORAL_TABLET | Freq: Two times a day (BID) | ORAL | Status: DC
  Administered 2017-11-21 – 2017-11-22 (×2): 8.6 mg via ORAL
  Filled 2017-11-21 (×2): qty 1

## 2017-11-21 MED ORDER — OXYCODONE HCL 5 MG PO TABS: 5.0000 mg | ORAL_TABLET | ORAL | Status: DC | PRN

## 2017-11-21 MED ORDER — SCOPOLAMINE 1 MG/3DAYS TD PT72
MEDICATED_PATCH | TRANSDERMAL | Status: AC
  Filled 2017-11-21: qty 1

## 2017-11-21 MED ORDER — HYDROCODONE-ACETAMINOPHEN 7.5-325 MG PO TABS: 1.0000 | ORAL_TABLET | Freq: Once | ORAL | Status: DC | PRN

## 2017-11-21 MED ORDER — FAMOTIDINE IN NACL 20-0.9 MG/50ML-% IV SOLN
20.0000 mg | Freq: Once | INTRAVENOUS | Status: AC
  Administered 2017-11-21: 20 mg via INTRAVENOUS
  Filled 2017-11-21 (×2): qty 50

## 2017-11-21 MED ORDER — ONDANSETRON HCL 4 MG/2ML IJ SOLN
4.0000 mg | Freq: Four times a day (QID) | INTRAMUSCULAR | Status: DC | PRN
Start: 2017-11-21 — End: 2017-11-22

## 2017-11-21 MED ORDER — MORPHINE SULFATE (PF) 2 MG/ML IV SOLN
2.0000 mg | INTRAVENOUS | Status: DC | PRN
  Administered 2017-11-21 – 2017-11-22 (×2): 2 mg via INTRAVENOUS
  Filled 2017-11-21 (×2): qty 1

## 2017-11-21 MED ORDER — METOPROLOL TARTRATE 5 MG/5ML IV SOLN: 5.0000 mg | Freq: Four times a day (QID) | INTRAVENOUS | Status: DC | PRN

## 2017-11-21 MED ORDER — MORPHINE SULFATE (PF) 4 MG/ML IV SOLN
4.0000 mg | Freq: Once | INTRAVENOUS | Status: AC
  Administered 2017-11-21: 4 mg via INTRAVENOUS
  Filled 2017-11-21: qty 1

## 2017-11-21 MED ORDER — SCOPOLAMINE 1 MG/3DAYS TD PT72
MEDICATED_PATCH | TRANSDERMAL | Status: DC | PRN
  Administered 2017-11-21: 1 via TRANSDERMAL

## 2017-11-21 MED ORDER — LIDOCAINE 2% (20 MG/ML) 5 ML SYRINGE
INTRAMUSCULAR | Status: AC
  Filled 2017-11-21: qty 5

## 2017-11-21 MED ORDER — BUPIVACAINE-EPINEPHRINE 0.25% -1:200000 IJ SOLN
INTRAMUSCULAR | Status: DC | PRN
  Administered 2017-11-21: 30 mL

## 2017-11-21 MED ORDER — ACETAMINOPHEN 650 MG RE SUPP
650.0000 mg | Freq: Four times a day (QID) | RECTAL | Status: DC | PRN
Start: 2017-11-21 — End: 2017-11-22

## 2017-11-21 MED ORDER — ONDANSETRON HCL 4 MG/2ML IJ SOLN
4.0000 mg | Freq: Once | INTRAMUSCULAR | Status: AC
  Administered 2017-11-21: 4 mg via INTRAVENOUS
  Filled 2017-11-21: qty 2

## 2017-11-21 MED ORDER — ACETAMINOPHEN 325 MG PO TABS
650.0000 mg | ORAL_TABLET | Freq: Four times a day (QID) | ORAL | Status: DC | PRN
Start: 2017-11-21 — End: 2017-11-22

## 2017-11-21 MED ORDER — ONDANSETRON 4 MG PO TBDP: 4.0000 mg | ORAL_TABLET | Freq: Four times a day (QID) | ORAL | Status: DC | PRN

## 2017-11-21 MED ORDER — LACTATED RINGERS IV SOLN
INTRAVENOUS | Status: AC | PRN
  Administered 2017-11-21: 1000 mL via INTRAVENOUS

## 2017-11-21 MED ORDER — FENTANYL CITRATE (PF) 100 MCG/2ML IJ SOLN
INTRAMUSCULAR | Status: DC | PRN
  Administered 2017-11-21: 50 ug via INTRAVENOUS
  Administered 2017-11-21: 100 ug via INTRAVENOUS

## 2017-11-21 MED ORDER — MIDAZOLAM HCL 5 MG/5ML IJ SOLN
INTRAMUSCULAR | Status: DC | PRN
  Administered 2017-11-21: 2 mg via INTRAVENOUS

## 2017-11-21 MED ORDER — ONDANSETRON HCL 4 MG/2ML IJ SOLN
INTRAMUSCULAR | Status: AC
  Filled 2017-11-21: qty 2

## 2017-11-21 SURGICAL SUPPLY — 27 items
APPLIER CLIP 5 13 M/L LIGAMAX5 (MISCELLANEOUS) ×3
CABLE HIGH FREQUENCY MONO STRZ (ELECTRODE) ×3 IMPLANT
CATH REDDICK CHOLANGI 4FR 50CM (CATHETERS) ×3 IMPLANT
CHLORAPREP W/TINT 26ML (MISCELLANEOUS) ×3 IMPLANT
CLIP APPLIE 5 13 M/L LIGAMAX5 (MISCELLANEOUS) ×1 IMPLANT
COVER MAYO STAND STRL (DRAPES) ×3 IMPLANT
DECANTER SPIKE VIAL GLASS SM (MISCELLANEOUS) ×3 IMPLANT
DERMABOND ADVANCED (GAUZE/BANDAGES/DRESSINGS) ×2
DERMABOND ADVANCED .7 DNX12 (GAUZE/BANDAGES/DRESSINGS) ×1 IMPLANT
DRAPE C-ARM 42X120 X-RAY (DRAPES) ×3 IMPLANT
ELECT REM PT RETURN 15FT ADLT (MISCELLANEOUS) ×3 IMPLANT
GLOVE BIO SURGEON STRL SZ7.5 (GLOVE) ×3 IMPLANT
GOWN STRL REUS W/TWL XL LVL3 (GOWN DISPOSABLE) ×9 IMPLANT
HEMOSTAT SURGICEL 4X8 (HEMOSTASIS) IMPLANT
IV CATH 14GX2 1/4 (CATHETERS) ×3 IMPLANT
KIT BASIN OR (CUSTOM PROCEDURE TRAY) ×3 IMPLANT
POUCH SPECIMEN RETRIEVAL 10MM (ENDOMECHANICALS) ×3 IMPLANT
SCISSORS LAP 5X35 DISP (ENDOMECHANICALS) ×3 IMPLANT
SET IRRIG TUBING LAPAROSCOPIC (IRRIGATION / IRRIGATOR) ×3 IMPLANT
SLEEVE XCEL OPT CAN 5 100 (ENDOMECHANICALS) ×6 IMPLANT
SUT MNCRL AB 4-0 PS2 18 (SUTURE) ×3 IMPLANT
TOWEL OR 17X26 10 PK STRL BLUE (TOWEL DISPOSABLE) ×3 IMPLANT
TOWEL OR NON WOVEN STRL DISP B (DISPOSABLE) ×3 IMPLANT
TRAY LAPAROSCOPIC (CUSTOM PROCEDURE TRAY) ×3 IMPLANT
TROCAR BLADELESS OPT 5 100 (ENDOMECHANICALS) ×3 IMPLANT
TROCAR XCEL BLUNT TIP 100MML (ENDOMECHANICALS) ×3 IMPLANT
TUBING INSUF HEATED (TUBING) ×3 IMPLANT

## 2017-11-21 NOTE — Discharge Instructions (Signed)
Laparoscopic Cholecystectomy, Care After °This sheet gives you information about how to care for yourself after your procedure. Your health care provider may also give you more specific instructions. If you have problems or questions, contact your health care provider. °What can I expect after the procedure? °After the procedure, it is common to have: °· Pain at your incision sites. You will be given medicines to control this pain. °· Mild nausea or vomiting. °· Bloating and possible shoulder pain from the air-like gas that was used during the procedure. ° °Follow these instructions at home: °Incision care ° °· Follow instructions from your health care provider about how to take care of your incisions. Make sure you: °? Wash your hands with soap and water before you change your bandage (dressing). If soap and water are not available, use hand sanitizer. °? Change your dressing as told by your health care provider. °? Leave stitches (sutures), skin glue, or adhesive strips in place. These skin closures may need to be in place for 2 weeks or longer. If adhesive strip edges start to loosen and curl up, you may trim the loose edges. Do not remove adhesive strips completely unless your health care provider tells you to do that. °· Do not take baths, swim, or use a hot tub until your health care provider approves. Ask your health care provider if you can take showers. You may only be allowed to take sponge baths for bathing. °· Check your incision area every day for signs of infection. Check for: °? More redness, swelling, or pain. °? More fluid or blood. °? Warmth. °? Pus or a bad smell. °Activity °· Do not drive or use heavy machinery while taking prescription pain medicine. °· Do not lift anything that is heavier than 10 lb (4.5 kg) until your health care provider approves. °· Do not play contact sports until your health care provider approves. °· Do not drive for 24 hours if you were given a medicine to help you relax  (sedative). °· Rest as needed. Do not return to work or school until your health care provider approves. °General instructions °· Take over-the-counter and prescription medicines only as told by your health care provider. °· To prevent or treat constipation while you are taking prescription pain medicine, your health care provider may recommend that you: °? Drink enough fluid to keep your urine clear or pale yellow. °? Take over-the-counter or prescription medicines. °? Eat foods that are high in fiber, such as fresh fruits and vegetables, whole grains, and beans. °? Limit foods that are high in fat and processed sugars, such as fried and sweet foods. °Contact a health care provider if: °· You develop a rash. °· You have more redness, swelling, or pain around your incisions. °· You have more fluid or blood coming from your incisions. °· Your incisions feel warm to the touch. °· You have pus or a bad smell coming from your incisions. °· You have a fever. °· One or more of your incisions breaks open. °Get help right away if: °· You have trouble breathing. °· You have chest pain. °· You have increasing pain in your shoulders. °· You faint or feel dizzy when you stand. °· You have severe pain in your abdomen. °· You have nausea or vomiting that lasts for more than one day. °· You have leg pain. °This information is not intended to replace advice given to you by your health care provider. Make sure you discuss any questions you   have with your health care provider. Document Released: 05/08/2005 Document Revised: 11/27/2015 Document Reviewed: 10/25/2015 Elsevier Interactive Patient Education  2018 [employer] LAPAROSCOPIC SURGERY: POST OP INSTRUCTIONS Always review your discharge instruction sheet given to you by the facility where your surgery was performed. IF YOU HAVE DISABILITY OR FAMILY LEAVE FORMS, YOU MUST BRING THEM TO THE OFFICE FOR PROCESSING.   DO NOT GIVE  THEM TO YOUR DOCTOR.  1. A prescription for pain medication may be given to you upon discharge.  Take your pain medication as prescribed, if needed.  If narcotic pain medicine is not needed, then you may take acetaminophen (Tylenol) or ibuprofen (Advil) as needed. 2. Take your usually prescribed medications unless otherwise directed. 3. If you need a refill on your pain medication, please contact your pharmacy.  They will contact our office to request authorization. Prescriptions will not be filled after 5pm or on week-ends. 4. You should follow a light diet the first few days after arrival home, such as soup and crackers, etc.  Be sure to include lots of fluids daily. 5. Most patients will experience some swelling and bruising in the area of the incisions.  Ice packs will help.  Swelling and bruising can take several days to resolve.  6. It is common to experience some constipation if taking pain medication after surgery.  Increasing fluid intake and taking a stool softener (such as Colace) will usually help or prevent this problem from occurring.  A mild laxative (Milk of Magnesia or Miralax) should be taken according to package instructions if there are no bowel movements after 48 hours. 7. Unless discharge instructions indicate otherwise, you may remove your bandages 24-48 hours after surgery, and you may shower at that time.  You may have steri-strips (small skin tapes) in place directly over the incision.  These strips should be left on the skin for 7-10 days.  If your surgeon used skin glue on the incision, you may shower in 24 hours.  The glue will flake off over the next 2-3 weeks.  Any sutures or staples will be removed at the office during your follow-up visit. 8. ACTIVITIES:  You may resume regular (light) daily activities beginning the next day--such as daily self-care, walking, climbing stairs--gradually increasing activities as tolerated.  You may have sexual intercourse when it is  comfortable.  Refrain from any heavy lifting or straining until approved by your doctor. a. You may drive when you are no longer taking prescription pain medication, you can comfortably wear a seatbelt, and you can safely maneuver your car and apply brakes. b. RETURN TO WORK:  __________________________________________________________ 9. You should see your doctor in the office for a follow-up appointment approximately 2-3 weeks after your surgery.  Make sure that you call for this appointment within a day or two after you arrive home to insure a convenient appointment time. 10. OTHER INSTRUCTIONS: __________________________________________________________________________________________________________________________ __________________________________________________________________________________________________________________________ WHEN TO CALL YOUR DOCTOR: 1. Fever over 101.0 2. Inability to urinate 3. Continued bleeding from incision. 4. Increased pain, redness, or drainage from the incision. 5. Increasing abdominal pain  The clinic staff is available to answer your questions during regular business hours.  Please dont hesitate to call and ask to speak to one of the nurses for clinical concerns.  If you have a medical emergency, go to the nearest emergency room or call 911.  A surgeon from [facility] is always on call at the hospital. [address]  [address] [place], [place], [place]  [address] ? [address], [place], [place]   [address] [phone] ? [phone] ? FAX [phone] Web site: [email or web address]

## 2017-11-21 NOTE — ED Notes (Signed)
ED TO INPATIENT HANDOFF REPORT  Name/Age/Gender Melissa Case 57 y.o. female  Code Status Code Status History    Date Active Date Inactive Code Status Order ID Comments User Context   09/30/2013 2339 10/02/2013 1642 Full Code [id number]  Robbie Lis, MD Inpatient      Home/SNF/Other Home  Chief Complaint Abdominal Pain  Level of Care/Admitting Diagnosis ED Disposition    ED Disposition Condition [facility] Area: [facility] [[id number]]  Level of Care: Med-Surg [16]  Diagnosis: Acute calculous cholecystitis [532023]  Admitting Physician: [place], [place]  Attending Physician: CCS, MD [[id number]]  Estimated length of stay: past midnight tomorrow  Certification:: I certify this patient will need inpatient services for at least 2 midnights  PT Class (Do Not Modify): Inpatient [101]  PT Acc Code (Do Not Modify): Private [1]       Medical History Past Medical History:  Diagnosis Date  . Depression   . GERD (gastroesophageal reflux disease)     Allergies Allergies  Allergen Reactions  . Penicillins Rash    Has patient had a PCN reaction causing immediate rash, facial/tongue/throat swelling, SOB or lightheadedness with hypotension: No Has patient had a PCN reaction causing severe rash involving mucus membranes or skin necrosis: No Has patient had a PCN reaction that required hospitalization No Has patient had a PCN reaction occurring within the last 10 years: No If all of the above answers are "NO", then may proceed with Cephalosporin use.     IV Location/Drains/Wounds Patient Lines/Drains/Airways Status   Active Line/Drains/Airways    None          Labs/Imaging Results for orders placed or performed during the hospital encounter of 11/20/17 (from the past 48 hour(s))  CBC with Differential     Status: Abnormal   Collection Time: 11/20/17 10:44 PM  Result Value Ref Range   WBC 10.9 (H) 4.0 - 10.5 K/uL   RBC 4.52 3.87 - 5.11 MIL/uL    Hemoglobin 13.5 12.0 - 15.0 g/dL   HCT 41.0 36.0 - 46.0 %   MCV 90.7 78.0 - 100.0 fL   MCH 29.9 26.0 - 34.0 pg   MCHC 32.9 30.0 - 36.0 g/dL   RDW 14.0 11.5 - 15.5 %   Platelets 248 150 - 400 K/uL   Neutrophils Relative % 57 %   Neutro Abs 6.3 1.7 - 7.7 K/uL   Lymphocytes Relative 29 %   Lymphs Abs 3.2 0.7 - 4.0 K/uL   Monocytes Relative 10 %   Monocytes Absolute 1.1 (H) 0.1 - 1.0 K/uL   Eosinophils Relative 3 %   Eosinophils Absolute 0.3 0.0 - 0.7 K/uL   Basophils Relative 1 %   Basophils Absolute 0.1 0.0 - 0.1 K/uL    Comment: Performed at [facility], [place] [address], [place], [place] [address]  Basic metabolic panel     Status: Abnormal   Collection Time: 11/20/17 10:44 PM  Result Value Ref Range   Sodium 142 135 - 145 mmol/L   Potassium 3.9 3.5 - 5.1 mmol/L   Chloride 105 98 - 111 mmol/L    Comment: Please note change in reference range.   CO2 34 (H) 22 - 32 mmol/L   Glucose, Bld 104 (H) 70 - 99 mg/dL    Comment: Please note change in reference range.   BUN 10 6 - 20 mg/dL    Comment: Please note change in reference range.   Creatinine, Ser 0.62  0.44 - 1.00 mg/dL   Calcium 9.0 8.9 - 10.3 mg/dL   GFR calc non Af Amer >60 >60 mL/min   GFR calc Af Amer >60 >60 mL/min    Comment: (NOTE) The eGFR has been calculated using the CKD EPI equation. This calculation has not been validated in all clinical situations. eGFR's persistently <60 mL/min signify possible Chronic Kidney Disease.    Anion gap 3 (L) 5 - 15    Comment: Performed at [facility], [place] [address], [place], [place] [address]  Lipase, blood     Status: Abnormal   Collection Time: 11/20/17 10:44 PM  Result Value Ref Range   Lipase 52 (H) 11 - 51 U/L    Comment: Performed at [facility], [place] [address] [place], [place] [address]  Hepatic function panel     Status: None   Collection Time: 11/20/17 10:44 PM  Result Value Ref Range   Total Protein 7.1  6.5 - 8.1 g/dL   Albumin 3.6 3.5 - 5.0 g/dL   AST 16 15 - 41 U/L   ALT 19 0 - 44 U/L    Comment: Please note change in reference range.   Alkaline Phosphatase 76 38 - 126 U/L   Total Bilirubin 0.7 0.3 - 1.2 mg/dL   Bilirubin, Direct 0.1 0.0 - 0.2 mg/dL    Comment: Please note change in reference range.   Indirect Bilirubin 0.6 0.3 - 0.9 mg/dL    Comment: Performed at [facility], [place] [address], [place], [place] [address]   US Abdomen Limited Ruq  Result Date: 11/19/2017 CLINICAL DATA:  Right upper quadrant pain EXAM: ULTRASOUND ABDOMEN LIMITED RIGHT UPPER QUADRANT COMPARISON:  08/18/2016 radiograph FINDINGS: Gallbladder: Gallstone measuring up to 2.3 cm. Wall thickness within normal limits. No sonographic Murphy. Common bile duct: Diameter: Upper normal at 6 mm Liver: No focal lesion identified. Within normal limits in parenchymal echogenicity. Portal vein is patent on color Doppler imaging with normal direction of blood flow towards the liver. IMPRESSION: Cholelithiasis without sonographic evidence for acute cholecystitis or biliary dilatation Electronically Signed   By: Donavan Foil M.D.   On: 11/19/2017 21:43    Pending Labs [employer] (From admission, onward)   Start     Ordered   Signed and Held  HIV antibody (Routine Testing)  Once,   R     Signed and Held   Signed and Held  CBC  (enoxaparin (LOVENOX)    CrCl >/= 30 ml/min)  Once,   R    Comments:  Baseline for enoxaparin therapy IF NOT ALREADY DRAWN.  Notify MD if PLT < 100 K.    Signed and Held   Signed and Held  Creatinine, serum  (enoxaparin (LOVENOX)    CrCl >/= 30 ml/min)  Once,   R    Comments:  Baseline for enoxaparin therapy IF NOT ALREADY DRAWN.    Signed and Held   Signed and Held  Creatinine, serum  (enoxaparin (LOVENOX)    CrCl >/= 30 ml/min)  Weekly,   R    Comments:  while on enoxaparin therapy    Signed and Held   Signed and Held  Comprehensive metabolic panel  Tomorrow morning,   R      Signed and Held   Signed and Held  CBC  Tomorrow morning,   R     Signed and Held      Vitals/Pain Today's Vitals   11/20/17 2233 11/21/17 0030  11/21/17 0030 11/21/17 0130  BP:  (!) 100/57  114/69  Pulse:  78  65  Resp:  15  16  Temp:      TempSrc:      SpO2:  98%  94%  Weight: 178 lb (80.7 kg)     Height: 5' 4"  (1.626 m)     PainSc:   10-Worst pain ever     Isolation Precautions No active isolations  Medications Medications  ondansetron (ZOFRAN) injection 4 mg (4 mg Intravenous Given 11/21/17 0024)  morphine 4 MG/ML injection 4 mg (4 mg Intravenous Given 11/21/17 0024)  sodium chloride 0.9 % bolus 1,000 mL (0 mLs Intravenous Stopped 11/21/17 0205)    Mobility walks

## 2017-11-21 NOTE — Progress Notes (Signed)
Patient ID: Melissa Case, female   DOB: 1960/11/01, 57 y.o.   MRN: [id number] Plan for lap chole with ioc today. Risks and benefits of the surgery discussed with pt and she understands and wishes to proceed. This includes risk of cbd injury and possible open procedure

## 2017-11-21 NOTE — Transfer of Care (Signed)
Immediate Anesthesia Transfer of Care Note  Patient: Melissa Case  Procedure(s) Performed: LAPAROSCOPIC CHOLECYSTECTOMY WITH INTRAOPERATIVE CHOLANGIOGRAM (N/A Abdomen)  Patient Location: PACU  Anesthesia Type:General  Level of Consciousness: drowsy and patient cooperative  Airway & Oxygen Therapy: Patient Spontanous Breathing and Patient connected to face mask oxygen  Post-op Assessment: Report given to RN and Post -op Vital signs reviewed and stable  Post vital signs: Reviewed and stable  Last Vitals:  Vitals Value Taken Time  BP 123/75 11/21/2017  5:36 PM  Temp 37.3 C 11/21/2017  5:36 PM  Pulse 83 11/21/2017  5:38 PM  Resp 17 11/21/2017  5:38 PM  SpO2 97 % 11/21/2017  5:38 PM  Vitals shown include unvalidated device data.  Last Pain:  Vitals:   11/21/17 1736  TempSrc:   PainSc: 0-No pain         Complications: No apparent anesthesia complications

## 2017-11-21 NOTE — Progress Notes (Signed)
Pt arrived to unit via stretcher. Steady gait to the bed. Alert and oriented x4. Pain 0/10. VS taken. Pt oriented to  Room and callbell with no complications. Initial assessment completed. Will continue to monitor.

## 2017-11-21 NOTE — Anesthesia Procedure Notes (Signed)
Procedure Name: Intubation Date/Time: 11/21/2017 4:23 PM Performed by: Montel Clock, CRNA Pre-anesthesia Checklist: Patient identified, Emergency Drugs available, Suction available, Patient being monitored and Timeout performed Patient Re-evaluated:Patient Re-evaluated prior to induction Oxygen Delivery Method: Circle system utilized Preoxygenation: Pre-oxygenation with 100% oxygen Induction Type: IV induction Ventilation: Mask ventilation without difficulty and Oral airway inserted - appropriate to patient size Laryngoscope Size: Mac and 3 Grade View: Grade I Tube type: Oral Tube size: 7.5 mm Number of attempts: 1 Airway Equipment and Method: Stylet Placement Confirmation: ETT inserted through vocal cords under direct vision,  positive ETCO2 and breath sounds checked- equal and bilateral Secured at: 21 cm Tube secured with: Tape Dental Injury: Teeth and Oropharynx as per pre-operative assessment

## 2017-11-21 NOTE — ED Provider Notes (Signed)
[place] DEPT Provider Note   CSN: [id number] Arrival date & time: 11/20/17  2218     History   Chief Complaint Chief Complaint  Patient presents with  . Abdominal Pain    HPI Melissa Case is a 57 y.o. female.  Patient is a 57 year old female with past medical history of cholelithiasis presenting with complaints of right upper quadrant and epigastric pain.  This is been worsening over the past several days.  She was seen here earlier with similar complaints, then discharged with general surgery follow-up.  She returns tonight stating that her pain is too bad in the medicine she was given is not helping.  She denies any fevers or chills.  She feels nauseated, but has not vomited.  Her last bowel movement was yesterday.  The history is provided by the patient.  Abdominal Pain   This is a new problem. The current episode started 2 days ago. Episode frequency: Intermittently. The problem has been rapidly worsening. The pain is associated with an unknown factor. The pain is located in the RUQ and epigastric region. The quality of the pain is cramping. The pain is severe. Pertinent negatives include fever, diarrhea, hematochezia, constipation and dysuria. Exacerbated by: Movement and palpation. Nothing relieves the symptoms.    Past Medical History:  Diagnosis Date  . Depression   . GERD (gastroesophageal reflux disease)     Patient Active Problem List   Diagnosis Date Noted  . Abdominal pain 09/30/2013  . SBO (small bowel obstruction) ([place]) 09/30/2013  . 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 Gall stone 04/05/2012  . Depression 09/15/2011    Past Surgical History:  Procedure Laterality Date  . ABDOMINAL HYSTERECTOMY       OB History   None      Home Medications    Prior to Admission medications   Medication Sig Start Date End Date Taking? Authorizing Provider  dicyclomine (BENTYL) 20 MG tablet Take 1 tablet (20 mg total) by mouth 2 (two)  times daily. 11/19/17   Khatri, Hina, PA-C  esomeprazole (NEXIUM) 40 MG capsule 40 mg at bedtime. 05/10/15   Smothers, Andree Elk, NP  famotidine (PEPCID) 20 MG tablet Take 1 tablet (20 mg total) by mouth 2 (two) times daily. 11/19/17   Khatri, Hina, PA-C  ondansetron (ZOFRAN ODT) 4 MG disintegrating tablet Take 1 tablet (4 mg total) by mouth every 8 (eight) hours as needed for nausea or vomiting. 11/19/17   Khatri, Hina, PA-C  PARoxetine (PAXIL) 40 MG tablet Take 1 tablet (40 mg total) by mouth every morning. 07/23/17   Arfeen, Arlyce Harman, MD    Family History Family History  Problem Relation Age of Onset  . Depression Sister     Social History Social History   Tobacco Use  . Smoking status: Current Every Day Smoker    Packs/day: 0.50    Years: 30.00    Pack years: 15.00    Types: Cigarettes  . Smokeless tobacco: Never Used  Substance Use Topics  . Alcohol use: No    Alcohol/week: 0.0 oz  . Drug use: No     Allergies   Penicillins   Review of Systems Review of Systems  Constitutional: Negative for fever.  Gastrointestinal: Positive for abdominal pain. Negative for constipation, diarrhea and hematochezia.  Genitourinary: Negative for dysuria.  All other systems reviewed and are negative.    Physical Exam Updated Vital Signs BP (!) 109/58 (BP Location: Left Arm)   Pulse 72   Temp  98.4 F (36.9 C) (Oral)   Resp 18   Ht 5\' 4"  (1.626 m)   Wt 80.7 kg (178 lb)   SpO2 97%   BMI 30.55 kg/m   Physical Exam  Constitutional: She is oriented to person, place, and time. She appears well-developed and well-nourished. No distress.  HENT:  Head: Normocephalic and atraumatic.  Neck: Normal range of motion. Neck supple.  Cardiovascular: Normal rate and regular rhythm. Exam reveals no gallop and no friction rub.  No murmur heard. Pulmonary/Chest: Effort normal and breath sounds normal. No respiratory distress. She has no wheezes.  Abdominal: Soft. Bowel sounds are normal. She exhibits  no distension. There is tenderness in the right upper quadrant and epigastric area. There is no rigidity, no rebound and no guarding.  Musculoskeletal: Normal range of motion.  Neurological: She is alert and oriented to person, place, and time.  Skin: Skin is warm and dry. She is not diaphoretic.  Nursing note and vitals reviewed.    ED Treatments / Results  Labs (all labs ordered are listed, but only abnormal results are displayed) Labs Reviewed  CBC WITH DIFFERENTIAL/PLATELET - Abnormal; Notable for the following components:      Result Value   WBC 10.9 (*)    Monocytes Absolute 1.1 (*)    All other components within normal limits  BASIC METABOLIC PANEL - Abnormal; Notable for the following components:   CO2 34 (*)    Glucose, Bld 104 (*)    Anion gap 3 (*)    All other components within normal limits  LIPASE, BLOOD - Abnormal; Notable for the following components:   Lipase 52 (*)    All other components within normal limits  HEPATIC FUNCTION PANEL    EKG None  Radiology US Abdomen Limited Ruq  Result Date: 11/19/2017 CLINICAL DATA:  Right upper quadrant pain EXAM: ULTRASOUND ABDOMEN LIMITED RIGHT UPPER QUADRANT COMPARISON:  08/18/2016 radiograph FINDINGS: Gallbladder: Gallstone measuring up to 2.3 cm. Wall thickness within normal limits. No sonographic Murphy. Common bile duct: Diameter: Upper normal at 6 mm Liver: No focal lesion identified. Within normal limits in parenchymal echogenicity. Portal vein is patent on color Doppler imaging with normal direction of blood flow towards the liver. IMPRESSION: Cholelithiasis without sonographic evidence for acute cholecystitis or biliary dilatation Electronically Signed   By: Donavan Foil M.D.   On: 11/19/2017 21:43    Procedures Procedures (including critical care time)  Medications Ordered in ED Medications  ondansetron (ZOFRAN) injection 4 mg (has no administration in time range)  morphine 4 MG/ML injection 4 mg (has no  administration in time range)  sodium chloride 0.9 % bolus 1,000 mL (has no administration in time range)     Initial Impression / Assessment and Plan / ED Course  I have reviewed the triage vital signs and the nursing notes.  Pertinent labs & imaging results that were available during my care of the patient were reviewed by me and considered in my medical decision making (see chart for details).  Patient with known gallstone presenting with right upper quadrant pain.  She was seen earlier today for the same.  Her pain has returned and is unrelieved with home meds.  Work-up reveals no elevation of white count and normal LFTs.  The patient's care was discussed with Dr. Barry Dienes from general surgery who will evaluate and admit, likely for urgent cholecystectomy.  Final Clinical Impressions(s) / ED Diagnoses   Final diagnoses:  None    ED Discharge Orders  None       Veryl Speak, MD 11/21/17 [phone]

## 2017-11-21 NOTE — Anesthesia Preprocedure Evaluation (Addendum)
Anesthesia Evaluation  Patient identified by MRN, date of birth, ID band Patient awake    Reviewed: Allergy & Precautions, NPO status , Patient's Chart, lab work & pertinent test results  Airway Mallampati: II  TM Distance: >3 FB Neck ROM: Full    Dental no notable dental hx. (+) Poor Dentition, Chipped, Missing   Pulmonary Current Smoker,    Pulmonary exam normal breath sounds clear to auscultation       Cardiovascular negative cardio ROS Normal cardiovascular exam Rhythm:Regular Rate:Normal     Neuro/Psych Depression    GI/Hepatic Neg liver ROS, GERD  ,  Endo/Other    Renal/GU negative Renal ROS     Musculoskeletal   Abdominal   Peds  Hematology   Anesthesia Other Findings   Reproductive/Obstetrics                           Lab Results  Component Value Date   WBC 9.7 11/21/2017   HGB 12.1 11/21/2017   HCT 37.2 11/21/2017   MCV 91.6 11/21/2017   PLT 210 11/21/2017    Anesthesia Physical Anesthesia Plan  ASA: II  Anesthesia Plan: General   Post-op Pain Management:    Induction: Intravenous  PONV Risk Score and Plan: 2 and Treatment may vary due to age or medical condition, Ondansetron, Dexamethasone and Scopolamine patch - Pre-op  Airway Management Planned: Oral ETT  Additional Equipment:   Intra-op Plan:   Post-operative Plan: Extubation in OR  Informed Consent: I have reviewed the patients History and Physical, chart, labs and discussed the procedure including the risks, benefits and alternatives for the proposed anesthesia with the patient or authorized representative who has indicated his/her understanding and acceptance.   Dental advisory given  Plan Discussed with: CRNA  Anesthesia Plan Comments:         Anesthesia Quick Evaluation

## 2017-11-21 NOTE — H&P (Signed)
Melissa Case is an 57 y.o. female.   Chief Complaint: abdominal pain HPI:  The patient is a 57 yo F who presents BACK to the ED with worsening RUq/epigastric pain.  Her pain came on originally on 7/1 after having pizza and wings.  She has reflux, but this was different that anything she had ever felt.  She tried pepto bismol and having BM, but these did not alleviate her symptoms.  She was seen 7/1, diagnosed with gallstones, and discharged with bentyl/zofran.    The pain and nausea got worse. It is more constant now.  She describes the pain as severe and cramping in nature.  She denies fever/chills.  She denies jaundice.    She has at least 4-5 family members with gallbladder disease.    Past Medical History:  Diagnosis Date  . Depression   . GERD (gastroesophageal reflux disease)     Past Surgical History:  Procedure Laterality Date  . ABDOMINAL HYSTERECTOMY      Family History  Problem Relation Age of Onset  . Depression Sister    Social History:  reports that she has been smoking cigarettes.  She has a 15.00 pack-year smoking history. She has never used smokeless tobacco. She reports that she does not drink alcohol or use drugs.  Allergies:  Allergies  Allergen Reactions  . Penicillins Rash    Has patient had a PCN reaction causing immediate rash, facial/tongue/throat swelling, SOB or lightheadedness with hypotension: No Has patient had a PCN reaction causing severe rash involving mucus membranes or skin necrosis: No Has patient had a PCN reaction that required hospitalization No Has patient had a PCN reaction occurring within the last 10 years: No If all of the above answers are "NO", then may proceed with Cephalosporin use.     Current Meds  Medication Sig  . dicyclomine (BENTYL) 20 MG tablet Take 1 tablet (20 mg total) by mouth 2 (two) times daily.  Marland Kitchen esomeprazole (NEXIUM) 40 MG capsule 40 mg at bedtime.  . famotidine (PEPCID) 20 MG tablet Take 1 tablet (20 mg  total) by mouth 2 (two) times daily.  . ondansetron (ZOFRAN ODT) 4 MG disintegrating tablet Take 1 tablet (4 mg total) by mouth every 8 (eight) hours as needed for nausea or vomiting.  Marland Kitchen PARoxetine (PAXIL) 40 MG tablet Take 1 tablet (40 mg total) by mouth every morning.    Results for orders placed or performed during the hospital encounter of 11/20/17 (from the past 48 hour(s))  CBC with Differential     Status: Abnormal   Collection Time: 11/20/17 10:44 PM  Result Value Ref Range   WBC 10.9 (H) 4.0 - 10.5 K/uL   RBC 4.52 3.87 - 5.11 MIL/uL   Hemoglobin 13.5 12.0 - 15.0 g/dL   HCT 41.0 36.0 - 46.0 %   MCV 90.7 78.0 - 100.0 fL   MCH 29.9 26.0 - 34.0 pg   MCHC 32.9 30.0 - 36.0 g/dL   RDW 14.0 11.5 - 15.5 %   Platelets 248 150 - 400 K/uL   Neutrophils Relative % 57 %   Neutro Abs 6.3 1.7 - 7.7 K/uL   Lymphocytes Relative 29 %   Lymphs Abs 3.2 0.7 - 4.0 K/uL   Monocytes Relative 10 %   Monocytes Absolute 1.1 (H) 0.1 - 1.0 K/uL   Eosinophils Relative 3 %   Eosinophils Absolute 0.3 0.0 - 0.7 K/uL   Basophils Relative 1 %   Basophils Absolute 0.1 0.0 - 0.1 K/uL  Comment: Performed at [facility], [place] [address], [place], [place] [address]  Basic metabolic panel     Status: Abnormal   Collection Time: 11/20/17 10:44 PM  Result Value Ref Range   Sodium 142 135 - 145 mmol/L   Potassium 3.9 3.5 - 5.1 mmol/L   Chloride 105 98 - 111 mmol/L    Comment: Please note change in reference range.   CO2 34 (H) 22 - 32 mmol/L   Glucose, Bld 104 (H) 70 - 99 mg/dL    Comment: Please note change in reference range.   BUN 10 6 - 20 mg/dL    Comment: Please note change in reference range.   Creatinine, Ser 0.62 0.44 - 1.00 mg/dL   Calcium 9.0 8.9 - 10.3 mg/dL   GFR calc non Af Amer >60 >60 mL/min   GFR calc Af Amer >60 >60 mL/min    Comment: (NOTE) The eGFR has been calculated using the CKD EPI equation. This calculation has not been validated in all clinical  situations. eGFR's persistently <60 mL/min signify possible Chronic Kidney Disease.    Anion gap 3 (L) 5 - 15    Comment: Performed at [facility], [place] [address], [place], [place] [address]  Lipase, blood     Status: Abnormal   Collection Time: 11/20/17 10:44 PM  Result Value Ref Range   Lipase 52 (H) 11 - 51 U/L    Comment: Performed at [facility], [place] [address], [place], [place] [address]  Hepatic function panel     Status: None   Collection Time: 11/20/17 10:44 PM  Result Value Ref Range   Total Protein 7.1 6.5 - 8.1 g/dL   Albumin 3.6 3.5 - 5.0 g/dL   AST 16 15 - 41 U/L   ALT 19 0 - 44 U/L    Comment: Please note change in reference range.   Alkaline Phosphatase 76 38 - 126 U/L   Total Bilirubin 0.7 0.3 - 1.2 mg/dL   Bilirubin, Direct 0.1 0.0 - 0.2 mg/dL    Comment: Please note change in reference range.   Indirect Bilirubin 0.6 0.3 - 0.9 mg/dL    Comment: Performed at [facility], [place] [address], [place], [place] [address]   US Abdomen Limited Ruq  Result Date: 11/19/2017 CLINICAL DATA:  Right upper quadrant pain EXAM: ULTRASOUND ABDOMEN LIMITED RIGHT UPPER QUADRANT COMPARISON:  08/18/2016 radiograph FINDINGS: Gallbladder: Gallstone measuring up to 2.3 cm. Wall thickness within normal limits. No sonographic Murphy. Common bile duct: Diameter: Upper normal at 6 mm Liver: No focal lesion identified. Within normal limits in parenchymal echogenicity. Portal vein is patent on color Doppler imaging with normal direction of blood flow towards the liver. IMPRESSION: Cholelithiasis without sonographic evidence for acute cholecystitis or biliary dilatation Electronically Signed   By: Donavan Foil M.D.   On: 11/19/2017 21:43    Review of Systems  Constitutional: Negative.   HENT: Negative.   Eyes: Negative.   Respiratory: Negative.   Cardiovascular: Negative.   Gastrointestinal: Positive for abdominal pain and nausea.   Genitourinary: Negative.   Musculoskeletal: Negative.   Skin: Negative.   Neurological: Negative.   Endo/Heme/Allergies: Negative.   Psychiatric/Behavioral: Negative.   All other systems reviewed and are negative.   Blood pressure (!) 100/57, pulse 78, temperature 98.4 F (36.9 C), temperature source Oral, resp. rate 15, height 5' 4"  (1.626 m), weight 80.7 kg (178 lb), SpO2 98 %. Physical Exam  Constitutional: She  is oriented to person, place, and time. She appears well-developed and well-nourished. No distress.  HENT:  Head: Normocephalic and atraumatic.  Eyes: Pupils are equal, round, and reactive to light. Conjunctivae are normal. Right eye exhibits no discharge. Left eye exhibits no discharge. No scleral icterus.  Neck: Neck supple. No tracheal deviation present. No thyromegaly present.  Cardiovascular: Normal rate, regular rhythm, normal heart sounds and intact distal pulses. Exam reveals no gallop and no friction rub.  No murmur heard. Respiratory: Effort normal and breath sounds normal. No respiratory distress. She has no wheezes.  GI: Soft. She exhibits no distension. There is tenderness (RUQ tender). There is guarding (voluntary).  Musculoskeletal: Normal range of motion. She exhibits no edema, tenderness or deformity.  Neurological: She is alert and oriented to person, place, and time. Coordination normal.  Skin: Skin is warm and dry. No rash noted. She is not diaphoretic. No erythema. No pallor.  Psychiatric: She has a normal mood and affect. Her behavior is normal. Judgment and thought content normal.     Assessment/Plan Acute calculous cholecystitis  Admit to the floor IV fluids NPO IV antibiotics  OR for lap chole and probable cholangiogram at available time.  Stark Klein, MD 11/21/2017, 1:13 AM

## 2017-11-21 NOTE — Op Note (Signed)
11/20/2017 - 11/21/2017  5:23 PM  PATIENT:  Melissa Case  57 y.o. female  PRE-OPERATIVE DIAGNOSIS:  gallstones  POST-OPERATIVE DIAGNOSIS:  gallstones with cholecystitis  PROCEDURE:  Procedure(s): LAPAROSCOPIC CHOLECYSTECTOMY WITH INTRAOPERATIVE CHOLANGIOGRAM (N/A)  SURGEON:  Surgeon(s) and Role:    * Jovita Kussmaul, MD - Primary  PHYSICIAN ASSISTANT:   ASSISTANTS: will jennings, pa   ANESTHESIA:   general  EBL:  20 mL   BLOOD ADMINISTERED:none  DRAINS: none   LOCAL MEDICATIONS USED:  MARCAINE     SPECIMEN:  Source of Specimen:  gallbladder  DISPOSITION OF SPECIMEN:  PATHOLOGY  COUNTS:  YES  TOURNIQUET:  * No tourniquets in log *  DICTATION: .Dragon Dictation   @opnoteheader @  Procedure: After informed consent was obtained the patient was brought to the operating room and placed in the supine position on the operating room table. After adequate induction of general anesthesia the patient's abdomen was prepped with ChloraPrep allowed to dry and draped in usual sterile manner. An appropriate timeout was performed. The area below the umbilicus was infiltrated with quarter percent  Marcaine. A small incision was made with a 15 blade knife. The incision was carried down through the subcutaneous tissue bluntly with a hemostat and [employer] retractors. The linea alba was identified. The linea alba was incised with a 15 blade knife and each side was grasped with Coker clamps. The preperitoneal space was then probed with a hemostat until the peritoneum was opened and access was gained to the abdominal cavity. A 0 Vicryl pursestring stitch was placed in the fascia surrounding the opening. A Hassan cannula was then placed through the opening and anchored in place with the previously placed Vicryl purse string stitch. The abdomen was insufflated with carbon dioxide without difficulty. A laparoscope was inserted through the [facility] cannula in the right upper quadrant was inspected. Next the  epigastric region was infiltrated with % Marcaine. A small incision was made with a 15 blade knife. A 5 mm port was placed bluntly through this incision into the abdominal cavity under direct vision. Next 2 sites were chosen laterally on the right side of the abdomen for placement of 5 mm ports. Each of these areas was infiltrated with quarter percent Marcaine. Small stab incisions were made with a 15 blade knife. 5 mm ports were then placed bluntly through these incisions into the abdominal cavity under direct vision without difficulty. The gallbladder was very inflamed and had to be aspirated so it could be grasped. A blunt grasper was placed through the lateralmost 5 mm port and used to grasp the dome of the gallbladder and elevated anteriorly and superiorly. Another blunt grasper was placed through the other 5 mm port and used to retract the body and neck of the gallbladder. A dissector was placed through the epigastric port and using the electrocautery the peritoneal reflection at the gallbladder neck was opened. Blunt dissection was then carried out in this area until the gallbladder neck-cystic duct junction was readily identified and a good window was created. A single clip was placed on the gallbladder neck. A small  ductotomy was made just below the clip with laparoscopic scissors. A 14-gauge Angiocath was then placed through the anterior abdominal wall under direct vision. A Reddick cholangiogram catheter was then placed through the Angiocath and flushed. The catheter was then placed in the cystic duct and anchored in place with a clip. A cholangiogram was obtained that showed no filling defects good emptying into the duodenum an adequate  length on the cystic duct. The anchoring clip and catheters were then removed from the patient. 3 clips were placed proximally on the cystic duct and the duct was divided between the 2 sets of clips. Posterior to this the cystic artery was identified and again dissected  bluntly in a circumferential manner until a good window  was created. 2 clips were placed proximally and one distally on the artery and the artery was divided between the 2 sets of clips. Next a laparoscopic hook cautery device was used to separate the gallbladder from the liver bed. Prior to completely detaching the gallbladder from the liver bed the liver bed was inspected and several small bleeding points were coagulated with the electrocautery until the area was completely hemostatic. The gallbladder was then detached the rest of it from the liver bed without difficulty. A laparoscopic bag was inserted through the hassan port. The laparoscope was moved to the epigastric port. The gallbladder was placed within the bag and the bag was sealed.  The bag with the gallbladder was then removed with the [facility] cannula through the infraumbilical port without difficulty. The fascial defect was then closed with the previously placed Vicryl pursestring stitch as well as with another figure-of-eight 0 Vicryl stitch. The liver bed was inspected again and found to be hemostatic. The abdomen was irrigated with copious amounts of saline until the effluent was clear. The ports were then removed under direct vision without difficulty and were found to be hemostatic. The gas was allowed to escape. The skin incisions were all closed with interrupted 4-0 Monocryl subcuticular stitches. Dermabond dressings were applied. The patient tolerated the procedure well. At the end of the case all needle sponge and instrument counts were correct. The patient was then awakened and taken to recovery in stable condition. The assistant was instrumental for retraction and visualization during the case.  PLAN OF CARE: Admit to inpatient   PATIENT DISPOSITION:  PACU - hemodynamically stable.   Delay start of Pharmacological VTE agent (>24hrs) due to surgical blood loss or risk of bleeding: no

## 2017-11-21 NOTE — Anesthesia Postprocedure Evaluation (Signed)
Anesthesia Post Note  Patient: Melissa Case  Procedure(s) Performed: LAPAROSCOPIC CHOLECYSTECTOMY WITH INTRAOPERATIVE CHOLANGIOGRAM (N/A Abdomen)     Patient location during evaluation: PACU Anesthesia Type: General Level of consciousness: awake and alert and oriented Pain management: pain level controlled Vital Signs Assessment: post-procedure vital signs reviewed and stable Respiratory status: spontaneous breathing, nonlabored ventilation, respiratory function stable and patient connected to nasal cannula oxygen Cardiovascular status: blood pressure returned to baseline and stable Postop Assessment: no apparent nausea or vomiting Anesthetic complications: no    Last Vitals:  Vitals:   11/21/17 1736 11/21/17 1745  BP: 123/75 (!) 126/58  Pulse: 79 82  Resp: 17 (!) 22  Temp: 37.3 C   SpO2: 97% 95%    Last Pain:  Vitals:   11/21/17 1745  TempSrc:   PainSc: 0-No pain                 Delphina Schum A.

## 2017-11-22 ENCOUNTER — Encounter (HOSPITAL_COMMUNITY): Payer: Self-pay | Admitting: General Surgery

## 2017-11-22 MED ORDER — TRAMADOL HCL 50 MG PO TABS: 50.0000 mg | ORAL_TABLET | Freq: Four times a day (QID) | ORAL | 0 refills | Status: DC | PRN

## 2017-11-22 NOTE — Discharge Summary (Signed)
Physician Discharge Summary [facility]  Patient ID: Melissa Case MRN: [id number] DOB/AGE: 57-Jul-1962 57 y.o.  Admit date: 11/20/2017 Discharge date: 11/22/2017  Admission Diagnoses:  Acute cholecystitis  Discharge Diagnoses:  Active Problems:   Acute calculous cholecystitis   Discharged Condition: good  Hospital Course: Patient was admitted for observation following gallbladder surgery.  Post op course was uncomplicated.  Pain was well controlled.  Tolerated diet.  Patient was prepared for discharge home on POD#1.  Consults: None  Treatments: surgery: lap chole with IOC  Discharge Exam: Blood pressure (!) 104/55, pulse (!) 53, temperature 97.7 F (36.5 C), temperature source Oral, resp. rate 18, height 5\' 4"  (1.626 m), weight 80.7 kg (178 lb), SpO2 93 %. HEENT - clear Neck - soft Chest - clear bilaterally Cor - RRR Abd - wounds dry and intact; soft; minimal tenderness  Disposition: Home  Discharge Instructions    Diet - low sodium heart healthy   Complete by:  As directed    Discharge instructions   Complete by:  As directed    [facility]  LAPAROSCOPIC SURGERY:  POST-OP INSTRUCTIONS  Always review your discharge instruction sheet given to you by the facility where your surgery was performed.  A prescription for pain medication may be given to you upon discharge.  Take your pain medication as prescribed.  If narcotic pain medicine is not needed, then you may take acetaminophen (Tylenol) or ibuprofen (Advil) as needed.  Take your usually prescribed medications unless otherwise directed.  If you need a refill on your pain medication, please contact your pharmacy.  They will contact our office to request authorization. Prescriptions will not be filled after 5 P.M. or on weekends.  You should follow a light diet the first few days after arrival home, such as soup and crackers or toast.  Be sure to include plenty of fluids  daily.  Most patients will experience some swelling and bruising in the area of the incisions.  Ice packs will help.  Swelling and bruising can take several days to resolve.   It is common to experience some constipation after surgery.  Increasing fluid intake and taking a stool softener (such as Colace) will usually help or prevent this problem from occurring.  A mild laxative (Milk of Magnesia or Miralax) should be taken according to package instructions if there has been no bowel movement after 48 hours.  You will have steri-strips and a gauze dressing over your incisions.  You may remove the gauze bandage on the second day after surgery, and you may shower at that time.  Leave your steri-strips (small skin tapes) in place directly over the incision.  These strips should remain on the skin for 5-7 days and then be removed.  You may get them wet in the shower and pat them dry.  Any sutures or staples will be removed at the office during your follow-up visit.  ACTIVITIES:  You may resume regular (light) daily activities beginning the next day - such as daily self-care, walking, climbing stairs - gradually increasing activities as tolerated.  You may have sexual intercourse when it is comfortable.  Refrain from any heavy lifting or straining until approved by your doctor.  You may drive when you are no longer taking prescription pain medication, you can comfortably wear a seatbelt, and you can safely maneuver your car and apply brakes.  You should see your doctor in the office for a follow-up appointment approximately 2-3 weeks  after your surgery.  Make sure that you call for this appointment within a day or two after you arrive home to insure a convenient appointment time.  WHEN TO CALL YOUR DOCTOR: Fever over 101.0 Inability to urinate Continued bleeding from incision Increased pain, redness, or drainage from the incision Increasing abdominal pain  The clinic staff is available to answer  your questions during regular business hours.  Please don't hesitate to call and ask to speak to one of the nurses for clinical concerns.  If you have a medical emergency, go to the nearest emergency room or call 911.  A surgeon from [facility] is always on call for the hospital.  Velora Heckler, MD, [facility] Office: [phone] Toll Free:  [phone] FAX [phone]  Website: [email or web address]   Increase activity slowly   Complete by:  As directed    No dressing needed   Complete by:  As directed      Allergies as of 11/22/2017      Reactions   Penicillins Rash   Has patient had a PCN reaction causing immediate rash, facial/tongue/throat swelling, SOB or lightheadedness with hypotension: No Has patient had a PCN reaction causing severe rash involving mucus membranes or skin necrosis: No Has patient had a PCN reaction that required hospitalization No Has patient had a PCN reaction occurring within the last 10 years: No If all of the above answers are "NO", then may proceed with Cephalosporin use.      Medication List    TAKE these medications   dicyclomine 20 MG tablet Commonly known as:  BENTYL Take 1 tablet (20 mg total) by mouth 2 (two) times daily.   esomeprazole 40 MG capsule Commonly known as:  NEXIUM 40 mg at bedtime.   famotidine 20 MG tablet Commonly known as:  PEPCID Take 1 tablet (20 mg total) by mouth 2 (two) times daily.   ondansetron 4 MG disintegrating tablet Commonly known as:  ZOFRAN ODT Take 1 tablet (4 mg total) by mouth every 8 (eight) hours as needed for nausea or vomiting.   PARoxetine 40 MG tablet Commonly known as:  PAXIL Take 1 tablet (40 mg total) by mouth every morning.   traMADol 50 MG tablet Commonly known as:  ULTRAM Take 1-2 tablets (50-100 mg total) by mouth every 6 (six) hours as needed for moderate pain (mild pain).      Follow-up Information    [facility].   Specialty:  General Surgery Why:  The office should call you on Friday with follow up appointment for 2 weeks.  Your appointment will be with the D[facility]. Contact information: [address] [place] [address] [phone]           Velora Heckler, MD, [facility] Office: [phone]   Signed: Velora Heckler 11/22/2017, 8:48 AM

## 2018-01-25 ENCOUNTER — Ambulatory Visit (HOSPITAL_COMMUNITY): Payer: Self-pay | Admitting: Psychiatry

## 2018-03-06 ENCOUNTER — Other Ambulatory Visit (HOSPITAL_COMMUNITY): Payer: Self-pay | Admitting: Psychiatry

## 2018-03-06 DIAGNOSIS — F330 Major depressive disorder, recurrent, mild: Secondary | ICD-10-CM

## 2018-03-27 ENCOUNTER — Ambulatory Visit (HOSPITAL_COMMUNITY): Payer: Self-pay | Admitting: Psychiatry

## 2018-04-26 ENCOUNTER — Encounter (HOSPITAL_COMMUNITY): Payer: Self-pay | Admitting: Psychiatry

## 2018-04-26 ENCOUNTER — Ambulatory Visit (INDEPENDENT_AMBULATORY_CARE_PROVIDER_SITE_OTHER): Payer: 59 | Admitting: Psychiatry

## 2018-04-26 DIAGNOSIS — F330 Major depressive disorder, recurrent, mild: Secondary | ICD-10-CM

## 2018-04-26 MED ORDER — PAROXETINE HCL 40 MG PO TABS: ORAL_TABLET | ORAL | 1 refills | Status: DC

## 2018-04-26 NOTE — Progress Notes (Signed)
BH MD/PA/NP OP Progress Note  04/26/2018 8:25 AM Melissa Case  MRN:  [id number]  Chief Complaint: I am doing good.  I had a good Thanksgiving.  HPI: Melissa Case came for her follow-up appointment.  She is doing good.  She had gallbladder surgery in July and she recovered from the surgery.  She had a good summer otherwise.  She is happy recently seen her grandkids who came on Thanksgiving.  She was disappointed because her daughter could not come who lives in [place].  However she is hoping to visit her soon as she has a plan to go [place].  Overall her anxiety and depression is under control.  She takes Paxil 40 mg for a long time and she reported no side effects.  Her sleep is good.  Her energy level is okay.  She is excited about Christmas as she is hoping to see grandkids.  Recently she is concerned about her father who lives with them.  Her father has A. fib and she is taking him to the doctor's appointment.  Patient denies any feeling of hopelessness or worthlessness.  She denies any suicidal thoughts, mania, hallucination or any paranoia.  Her energy level is good.  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  Her appetite is okay.  She has no tremors shakes or any EPS.  She is not interested in counseling.  Visit Diagnosis:    ICD-10-CM   1. Mild episode of recurrent major depressive disorder (HCC) F33.0 PARoxetine (PAXIL) 40 MG tablet    Past Psychiatric History: Reviewed. No history of psychiatric inpatient treatment, suicidal attempt, psychosis or any hallucination. History of depression and anxiety. In the past took trazodone with good response.   Past Medical History:  Past Medical History:  Diagnosis Date  . Depression   . GERD (gastroesophageal reflux disease)     Past Surgical History:  Procedure Laterality Date  . ABDOMINAL HYSTERECTOMY    . CHOLECYSTECTOMY N/A 11/21/2017   Procedure: LAPAROSCOPIC CHOLECYSTECTOMY WITH INTRAOPERATIVE CHOLANGIOGRAM;   Surgeon: Jovita Kussmaul, MD;  Location: [facility];  Service: General;  Laterality: N/A;    Family Psychiatric History: Reviewed.  Family History:  Family History  Problem Relation Age of Onset  . Depression Sister     Social History:  Social History   Socioeconomic History  . Marital status: Married    Spouse name: Not on file  . Number of children: Not on file  . Years of education: Not on file  . Highest education level: Not on file  Occupational History  . Not on file  Social Needs  . Financial resource strain: Not on file  . Food insecurity:    Worry: Not on file    Inability: Not on file  . Transportation needs:    Medical: Not on file    Non-medical: Not on file  Tobacco Use  . Smoking status: Current Every Day Smoker    Packs/day: 0.50    Years: 30.00    Pack years: 15.00    Types: Cigarettes  . Smokeless tobacco: Never Used  Substance and Sexual Activity  . Alcohol use: No    Alcohol/week: 0.0 standard drinks  . Drug use: No  . Sexual activity: Yes    Birth control/protection: None  Lifestyle  . Physical activity:    Days per week: Not on file    Minutes per session: Not on file  . Stress: Not on file  Relationships  .  Social connections:    Talks on phone: Not on file    Gets together: Not on file    Attends religious service: Not on file    Active member of club or organization: Not on file    Attends meetings of clubs or organizations: Not on file    Relationship status: Not on file  Other Topics Concern  . Not on file  Social History Narrative  . Not on file    Allergies:  Allergies  Allergen Reactions  . Penicillins Rash    Has patient had a PCN reaction causing immediate rash, facial/tongue/throat swelling, SOB or lightheadedness with hypotension: No Has patient had a PCN reaction causing severe rash involving mucus membranes or skin necrosis: No Has patient had a PCN reaction that required hospitalization No Has patient had a PCN  reaction occurring within the last 10 years: No If all of the above answers are "NO", then may proceed with Cephalosporin use.     Metabolic Disorder Labs: Lab Results  Component Value Date   HGBA1C 5.9 (H) 07/23/2017   No results found for: PROLACTIN No results found for: CHOL, TRIG, HDL, CHOLHDL, VLDL, LDLCALC Lab Results  Component Value Date   TSH 0.934 07/23/2017    Therapeutic Level Labs: No results found for: LITHIUM No results found for: VALPROATE No components found for:  CBMZ  Current Medications: Current Outpatient Medications  Medication Sig Dispense Refill  . esomeprazole (NEXIUM) 40 MG capsule 40 mg at bedtime.    Marland Kitchen PARoxetine (PAXIL) 40 MG tablet TAKE 1 TABLET BY MOUTH (40MG  TOTAL) EVERY MORNING 30 tablet 0   No current facility-administered medications for this visit.      Musculoskeletal: Strength & Muscle Tone: within normal limits Gait & Station: normal Patient leans: N/A  Psychiatric Specialty Exam: ROS  Blood pressure 104/67, pulse 63, resp. rate 12, height 5\' 4"  (1.626 m), weight 176 lb 6.4 oz (80 kg).Body mass index is 30.28 kg/m.  General Appearance: Casual  Eye Contact:  Good  Speech:  Clear and Coherent  Volume:  Normal  Mood:  Euthymic  Affect:  Appropriate  Thought Process:  Goal Directed  Orientation:  Full (Time, Place, and Person)  Thought Content: Logical   Suicidal Thoughts:  No  Homicidal Thoughts:  No  Memory:  Immediate;   Good Recent;   Good Remote;   Good  Judgement:  Good  Insight:  Good  Psychomotor Activity:  Normal  Concentration:  Concentration: Good and Attention Span: Good  Recall:  Good  Fund of Knowledge: Good  Language: Good  Akathisia:  No  Handed:  Right  AIMS (if indicated): not done  Assets:  Communication Skills Desire for Improvement Housing Resilience Social Support Transportation  ADL's:  Intact  Cognition: WNL  Sleep:  Good   Screenings:   Assessment and Plan: Major depressive  disorder, recurrent.  Patient is a stable on her current medication.  I reviewed blood work results when she was admitted in July.  She does not want to change medication.  Continue Paxil 40 mg daily.  She is not interested in therapy.  Recommended to call us back if he has any question or any concern.  Follow-up in 6 months.   Kathlee Nations, MD 04/26/2018, 8:25 AM

## 2018-09-20 ENCOUNTER — Other Ambulatory Visit (HOSPITAL_COMMUNITY): Payer: Self-pay | Admitting: Psychiatry

## 2018-09-20 DIAGNOSIS — F330 Major depressive disorder, recurrent, mild: Secondary | ICD-10-CM

## 2018-10-02 ENCOUNTER — Other Ambulatory Visit (HOSPITAL_COMMUNITY): Payer: Self-pay

## 2018-10-24 ENCOUNTER — Ambulatory Visit (INDEPENDENT_AMBULATORY_CARE_PROVIDER_SITE_OTHER): Payer: No Typology Code available for payment source | Admitting: Psychiatry

## 2018-10-24 ENCOUNTER — Other Ambulatory Visit: Payer: Self-pay

## 2018-10-24 ENCOUNTER — Encounter (HOSPITAL_COMMUNITY): Payer: Self-pay | Admitting: Psychiatry

## 2018-10-24 DIAGNOSIS — F330 Major depressive disorder, recurrent, mild: Secondary | ICD-10-CM

## 2018-10-24 DIAGNOSIS — F419 Anxiety disorder, unspecified: Secondary | ICD-10-CM | POA: Diagnosis not present

## 2018-10-24 MED ORDER — PAROXETINE HCL 40 MG PO TABS: ORAL_TABLET | ORAL | 1 refills | Status: DC

## 2018-10-24 NOTE — Progress Notes (Signed)
Virtual Visit via Telephone Note  I connected with Melissa Case on 10/24/18 at  8:20 AM EDT by telephone and verified that I am speaking with the correct person using two identifiers.   I discussed the limitations, risks, security and privacy concerns of performing an evaluation and management service by telephone and the availability of in person appointments. I also discussed with the patient that there may be a patient responsible charge related to this service. The patient expressed understanding and agreed to proceed.   History of Present Illness: Patient was evaluated by phone session.  She is taking Paxil as prescribed.  Patient told that she is working more than usual due to COVID-19 as job is very busy.  Patient is pleased that she is able to work every day and her husband is also working.  She is sleeping better because she is so tired that she has no trouble falling asleep.  Patient told she is able to face time her grandkids and she talks to them regularly.  Patient admitted in the beginning she was anxious due to COVID-19 but now she is adjusting well and denies any crying spells or any feeling of hopelessness or worthlessness.  She has some anxiety about the future but it is not bothering or causing any difficulty in her daily life.  She reported her energy level is good.  Her appetite is okay and she is reporting no side effects of the medication.  She reported her weight is a stable.  She like to continue Paxil.  Patient denies drinking or using any illegal substances.     Past Psychiatric History: Reviewed. No history of psychiatric inpatient treatment, suicidal attempt, psychosis or any hallucination. History of depression and anxiety. In the past took trazodone with good response.  Psychiatric Specialty Exam: Physical Exam  ROS  There were no vitals taken for this visit.There is no height or weight on file to calculate BMI.  General Appearance: NA  Eye Contact:  NA  Speech:   Clear and Coherent  Volume:  Normal  Mood:  Euthymic  Affect:  NA  Thought Process:  Goal Directed  Orientation:  Full (Time, Place, and Person)  Thought Content:  Logical  Suicidal Thoughts:  No  Homicidal Thoughts:  No  Memory:  Immediate;   Good Recent;   Good  Judgement:  Good  Insight:  Good  Psychomotor Activity:  Normal  Concentration:  Concentration: Good and Attention Span: Good  Recall:  Good  Fund of Knowledge:  Good  Language:  Good  Akathisia:  No  Handed:  Right  AIMS (if indicated):     Assets:  Communication Skills Desire for Improvement Housing Resilience Social Support Talents/Skills  ADL's:  Intact  Cognition:  WNL  Sleep:   good      Assessment and Plan: Major depressive disorder, recurrent.  Anxiety.  Discussed current situation.  Patient is adjusting with COVID-19 and trying to go to work every day and keep herself busy.  She does not want to change medication.  Discussed medication side effects and benefits.  She is not interested in therapy.  I will continue Paxil 40 mg daily.  Recommended to call us back if she has any question or any concern.  Follow-up in 6 months.  Follow Up Instructions:    I discussed the assessment and treatment plan with the patient. The patient was provided an opportunity to ask questions and all were answered. The patient agreed with the plan and demonstrated  an understanding of the instructions.   The patient was advised to call back or seek an in-person evaluation if the symptoms worsen or if the condition fails to improve as anticipated.  I provided 15 minutes of non-face-to-face time during this encounter.   Kathlee Nations, MD

## 2018-10-25 ENCOUNTER — Telehealth (HOSPITAL_COMMUNITY): Payer: Self-pay | Admitting: Psychiatry

## 2019-02-24 ENCOUNTER — Encounter (HOSPITAL_COMMUNITY): Payer: Self-pay

## 2019-02-24 ENCOUNTER — Emergency Department (HOSPITAL_COMMUNITY)
Admission: EM | Admit: 2019-02-24 | Discharge: 2019-02-24 | Disposition: A | Discharge status: Home / Self Care | Payer: PRIVATE HEALTH INSURANCE | Attending: Emergency Medicine | Admitting: Emergency Medicine

## 2019-02-24 ENCOUNTER — Emergency Department (HOSPITAL_COMMUNITY): Payer: PRIVATE HEALTH INSURANCE

## 2019-02-24 DIAGNOSIS — R1031 Right lower quadrant pain: Secondary | ICD-10-CM | POA: Diagnosis present

## 2019-02-24 DIAGNOSIS — F17210 Nicotine dependence, cigarettes, uncomplicated: Secondary | ICD-10-CM | POA: Diagnosis not present

## 2019-02-24 LAB — COMPREHENSIVE METABOLIC PANEL
ALT: 18 U/L (ref 0–44)
AST: 14 U/L — ABNORMAL LOW (ref 15–41)
Albumin: 4 g/dL (ref 3.5–5.0)
Alkaline Phosphatase: 72 U/L (ref 38–126)
Anion gap: 9 (ref 5–15)
BUN: 16 mg/dL (ref 6–20)
CO2: 24 mmol/L (ref 22–32)
Calcium: 8.8 mg/dL — ABNORMAL LOW (ref 8.9–10.3)
Chloride: 106 mmol/L (ref 98–111)
Creatinine, Ser: 0.6 mg/dL (ref 0.44–1.00)
GFR calc Af Amer: >60 mL/min (ref >60)
GFR calc non Af Amer: >60 mL/min (ref >60)
Glucose, Bld: 110 mg/dL — ABNORMAL HIGH (ref 70–99)
Potassium: 3.8 mmol/L (ref 3.5–5.1)
Sodium: 139 mmol/L (ref 135–145)
Total Bilirubin: 0.5 mg/dL (ref 0.3–1.2)
Total Protein: 7.1 g/dL (ref 6.5–8.1)

## 2019-02-24 LAB — CBC
HCT: 42.1 % (ref 36.0–46.0)
Hemoglobin: 13.6 g/dL (ref 12.0–15.0)
MCH: 29.4 pg (ref 26.0–34.0)
MCHC: 32.3 g/dL (ref 30.0–36.0)
MCV: 91.1 fL (ref 80.0–100.0)
Platelets: 232 10*3/uL (ref 150–400)
RBC: 4.62 MIL/uL (ref 3.87–5.11)
RDW: 13.5 % (ref 11.5–15.5)
WBC: 6.5 10*3/uL (ref 4.0–10.5)
nRBC: 0 % (ref 0.0–0.2)

## 2019-02-24 LAB — URINALYSIS, ROUTINE W REFLEX MICROSCOPIC
Bilirubin Urine: NEGATIVE
Glucose, UA: NEGATIVE mg/dL
Hgb urine dipstick: NEGATIVE
Ketones, ur: NEGATIVE mg/dL
Leukocytes,Ua: NEGATIVE
Nitrite: NEGATIVE
Protein, ur: NEGATIVE mg/dL
Specific Gravity, Urine: 1.014 (ref 1.005–1.030)
pH: 6 (ref 5.0–8.0)

## 2019-02-24 LAB — LIPASE, BLOOD: Lipase: 34 U/L (ref 11–51)

## 2019-02-24 MED ORDER — SODIUM CHLORIDE (PF) 0.9 % IJ SOLN
INTRAMUSCULAR | Status: AC
  Filled 2019-02-24: qty 50

## 2019-02-24 MED ORDER — SODIUM CHLORIDE 0.9% FLUSH
3.0000 mL | Freq: Once | INTRAVENOUS | Status: AC
  Administered 2019-02-24: 08:00:00 3 mL via INTRAVENOUS

## 2019-02-24 MED ORDER — IOHEXOL 300 MG/ML  SOLN
100.0000 mL | Freq: Once | INTRAMUSCULAR | Status: AC | PRN
  Administered 2019-02-24: 100 mL via INTRAVENOUS

## 2019-02-24 NOTE — Discharge Instructions (Addendum)
1.  See your doctor for recheck this week. 2.  Drink plenty of fluids and eat a very bland diet for the next 2 to 3 days. 3.  Return to the emergency department if you have recurrence of symptoms or new or concerning symptoms develop.

## 2019-02-24 NOTE — ED Notes (Addendum)
Patient ambulated to restroom with no assistance and no problems noted with gate.   Patient updated on plan of care to have CT. Patient verbalized understanding.

## 2019-02-24 NOTE — ED Notes (Signed)
Visitor (husband) at bedside.

## 2019-02-24 NOTE — ED Notes (Signed)
Patient made aware that she can have 1 visitor.

## 2019-02-24 NOTE — ED Provider Notes (Signed)
[facility] DEPT Provider Note   CSN: [id number] Arrival date & time: 02/24/19  0711     History   Chief Complaint Chief Complaint  Patient presents with  . Abdominal Pain    HPI Melissa Case is a 58 y.o. female.     HPI Patient reports that she had already been up this morning and feeling fine.  She quite abruptly developed severe pain in her right lower quadrant.  She describes the quality as "a pain".  She reports it was deep and intense.  She denies any radiation of pain.  She denies any history of similar pain.  Reports she has had a cholecystectomy done last year.  She has not been having any problems with abdominal pain or food intolerance.  She reports her bowel movements have been normal.  No diarrhea or constipation.  No pain burning urgency or blood in the urine.  No fevers chills.  Patient reports he has been feeling well.  She was at work yesterday without any issues.  She reports she works in a [employer] and does light to moderate lifting which is not taxing.  No history of kidney stones.  No history of similar pain.  Patient reports she was given fentanyl by EMS en route and that resolved her pain.  Patient reports that she had a right eye inflammation a little over a week ago.  She saw the ophthalmologist and was given a course of Bactrim and prednisone.  She reports those symptoms have resolved.  She was told she had "sinus inflammation".  She was due to see the ophthalmologist again today for follow-up. Past Medical History:  Diagnosis Date  . Depression   . GERD (gastroesophageal reflux disease)     Patient Active Problem List   Diagnosis Date Noted  . Acute calculous cholecystitis 11/21/2017  . Abdominal pain 09/30/2013  . SBO (small bowel obstruction) ([place]) 09/30/2013  . 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 Gall stone 04/05/2012  . Depression 09/15/2011    Past Surgical History:  Procedure Laterality Date  . ABDOMINAL  HYSTERECTOMY    . CHOLECYSTECTOMY N/A 11/21/2017   Procedure: LAPAROSCOPIC CHOLECYSTECTOMY WITH INTRAOPERATIVE CHOLANGIOGRAM;  Surgeon: Jovita Kussmaul, MD;  Location: [facility];  Service: General;  Laterality: N/A;     OB History   No obstetric history on file.      Home Medications    Prior to Admission medications   Medication Sig Start Date End Date Taking? Authorizing Provider  esomeprazole (NEXIUM) 40 MG capsule 40 mg at bedtime. 05/10/15  Yes Smothers, Andree Elk, NP  neomycin-polymyxin b-dexamethasone (MAXITROL) 3.5-10000-0.1 OINT Place 1 application into both eyes at bedtime. 02/17/19  Yes [provider]  PARoxetine (PAXIL) 40 MG tablet TAKE 1 TABLET BY MOUTH (40MG  TOTAL) EVERY MORNING 10/24/18  Yes Arfeen, Arlyce Harman, MD  prednisoLONE acetate (PRED FORTE) 1 % ophthalmic suspension Place 1 drop into both eyes 4 (four) times daily. Qid for one week, then bid for two weeks 02/17/19  Yes [provider]  sulfamethoxazole-trimethoprim (BACTRIM DS) 800-160 MG tablet Take 1 tablet by mouth 2 (two) times daily. for 7 days 02/17/19  Yes [provider]  methylPREDNISolone (MEDROL DOSEPAK) 4 MG TBPK tablet Take 4 mg by mouth See admin instructions. 6 day dosepak 02/17/19   [provider]    Family History Family History  Problem Relation Age of Onset  . Depression Sister     Social History Social History   Tobacco Use  .  Smoking status: Current Every Day Smoker    Packs/day: 0.50    Years: 30.00    Pack years: 15.00    Types: Cigarettes  . Smokeless tobacco: Never Used  Substance Use Topics  . Alcohol use: No    Alcohol/week: 0.0 standard drinks  . Drug use: No     Allergies   Penicillins   Review of Systems Review of Systems 10 Systems reviewed and are negative for acute change except as noted in the HPI.   Physical Exam Updated Vital Signs BP 123/61   Pulse 65   Temp (!) 97.5 F (36.4 C) (Oral)   Resp 18   SpO2 99%   Physical Exam  Constitutional:      Appearance: She is well-developed.  HENT:     Head: Normocephalic and atraumatic.  Neck:     Musculoskeletal: Neck supple.  Cardiovascular:     Rate and Rhythm: Normal rate and regular rhythm.     Heart sounds: Normal heart sounds.  Pulmonary:     Effort: Pulmonary effort is normal.     Breath sounds: Normal breath sounds.  Abdominal:     General: Bowel sounds are normal. There is no distension.     Palpations: Abdomen is soft.     Tenderness: There is no abdominal tenderness.     Comments: Femoral pulses normal.  No groin tenderness, mass or fullness.  Musculoskeletal: Normal range of motion.        General: No swelling or tenderness.     Right lower leg: No edema.     Left lower leg: No edema.     Comments: Condition of extremities is very good.  Patient has well-developed musculature, no edema, good skin condition.  Skin:    General: Skin is warm and dry.  Neurological:     Mental Status: She is alert and oriented to person, place, and time.     GCS: GCS eye subscore is 4. GCS verbal subscore is 5. GCS motor subscore is 6.     Coordination: Coordination normal.  Psychiatric:        Mood and Affect: Mood normal.      ED Treatments / Results  Labs (all labs ordered are listed, but only abnormal results are displayed) Labs Reviewed  COMPREHENSIVE METABOLIC PANEL - Abnormal; Notable for the following components:      Result Value   Glucose, Bld 110 (*)    Calcium 8.8 (*)    AST 14 (*)    All other components within normal limits  LIPASE, BLOOD  CBC  URINALYSIS, ROUTINE W REFLEX MICROSCOPIC    EKG None  Radiology Ct Abdomen Pelvis W Contrast  Result Date: 02/24/2019 CLINICAL DATA:  Abdominal pain.  Right lower quadrant pain. EXAM: CT ABDOMEN AND PELVIS WITH CONTRAST TECHNIQUE: Multidetector CT imaging of the abdomen and pelvis was performed using the standard protocol following bolus administration of intravenous contrast. CONTRAST:  174mL  OMNIPAQUE IOHEXOL 300 MG/ML  SOLN COMPARISON:  01/31/2014 FINDINGS: Lower chest: No acute abnormality. Hepatobiliary: No focal liver abnormality is seen. Status post cholecystectomy. No biliary dilatation. Pancreas: Unremarkable. No pancreatic ductal dilatation or surrounding inflammatory changes. Spleen: Normal in size without focal abnormality. Adrenals/Urinary Tract: Normal adrenal glands. Normal kidneys. No urolithiasis or obstructive uropathy. No focal bladder abnormality. Stomach/Bowel: Stomach is within normal limits. Appendix appears normal. No evidence of bowel wall thickening, distention, or inflammatory changes. Moderate amount of stool in the ascending colon. Vascular/Lymphatic: Normal caliber abdominal aorta with mild  atherosclerosis. No lymphadenopathy. Reproductive: Status post hysterectomy. No adnexal masses. Other: No abdominal wall hernia or abnormality. No abdominopelvic ascites. Musculoskeletal: No acute osseous abnormality. No aggressive osseous lesion. Bilateral facet arthropathy at L4-5 and L5-S1. IMPRESSION: 1. No acute abdominal or pelvic pathology. 2. Normal appendix. 3.  Aortic Atherosclerosis (ICD10-I70.0). Electronically Signed   By: Kathreen Devoid   On: 02/24/2019 10:58    Procedures Procedures (including critical care time)  Medications Ordered in ED Medications  sodium chloride (PF) 0.9 % injection (has no administration in time range)  sodium chloride flush (NS) 0.9 % injection 3 mL (3 mLs Intravenous Given 02/24/19 0731)  iohexol (OMNIPAQUE) 300 MG/ML solution 100 mL (100 mLs Intravenous Contrast Given 02/24/19 1028)     Initial Impression / Assessment and Plan / ED Course  I have reviewed the triage vital signs and the nursing notes.  Pertinent labs & imaging results that were available during my care of the patient were reviewed by me and considered in my medical decision making (see chart for details).        Patient had onset of acute severe pain as outlined  above.  It had resolved by the time she reached the emergency department.  Patient had had fentanyl by EMS.  She has been in the emergency department for 4 hours with no recurrence of pain.  Diagnostic work-up is within normal limits.  Patient's vital signs are normal.  At this time return precautions are reviewed.  Patient is instructed to follow-up with her PCP this week.  Final Clinical Impressions(s) / ED Diagnoses   Final diagnoses:  Right lower quadrant abdominal pain    ED Discharge Orders    None       Charlesetta Shanks, MD 02/24/19 1136

## 2019-02-24 NOTE — ED Triage Notes (Addendum)
Patient arrived via [facility] from home.   C/O sharp right lower abdominal pain around 6am after doing her normal morning routine  C/o Nausea Denies emesis Denies urinary symptoms  Last BM yesterday and normal per patient   Intermittent 10/10 pain   Has appendix still   Hx. Hysterectomy and colectomy per patient   A/Ox4 Ambulatory to restroom for UA sample   20g left forearm/wrist    EMS interventions: 50 mcg fentanyl given per ems 0/10 pain at this time

## 2019-04-06 ENCOUNTER — Other Ambulatory Visit (HOSPITAL_COMMUNITY): Payer: Self-pay | Admitting: Psychiatry

## 2019-04-06 DIAGNOSIS — F419 Anxiety disorder, unspecified: Secondary | ICD-10-CM

## 2019-04-06 DIAGNOSIS — F330 Major depressive disorder, recurrent, mild: Secondary | ICD-10-CM

## 2019-04-10 ENCOUNTER — Other Ambulatory Visit (HOSPITAL_COMMUNITY): Payer: Self-pay

## 2019-04-10 DIAGNOSIS — F330 Major depressive disorder, recurrent, mild: Secondary | ICD-10-CM

## 2019-04-10 DIAGNOSIS — F419 Anxiety disorder, unspecified: Secondary | ICD-10-CM

## 2019-04-10 MED ORDER — PAROXETINE HCL 40 MG PO TABS: ORAL_TABLET | ORAL | 0 refills | Status: DC

## 2019-04-23 ENCOUNTER — Ambulatory Visit (INDEPENDENT_AMBULATORY_CARE_PROVIDER_SITE_OTHER): Payer: No Typology Code available for payment source | Admitting: Psychiatry

## 2019-04-23 ENCOUNTER — Encounter (HOSPITAL_COMMUNITY): Payer: Self-pay | Admitting: Psychiatry

## 2019-04-23 ENCOUNTER — Other Ambulatory Visit: Payer: Self-pay

## 2019-04-23 DIAGNOSIS — F330 Major depressive disorder, recurrent, mild: Secondary | ICD-10-CM

## 2019-04-23 DIAGNOSIS — F419 Anxiety disorder, unspecified: Secondary | ICD-10-CM | POA: Diagnosis not present

## 2019-04-23 MED ORDER — PAROXETINE HCL 40 MG PO TABS: ORAL_TABLET | ORAL | 0 refills | Status: DC

## 2019-04-23 NOTE — Progress Notes (Signed)
Virtual Visit via Telephone Note  I connected with Melissa Case on 04/23/19 at  9:20 AM EST by telephone and verified that I am speaking with the correct person using two identifiers.   I discussed the limitations, risks, security and privacy concerns of performing an evaluation and management service by telephone and the availability of in person appointments. I also discussed with the patient that there may be a patient responsible charge related to this service. The patient expressed understanding and agreed to proceed.   History of Present Illness: Patient was evaluated by phone session.  She is doing very well on her medication.  Recently she was seen in the emergency room because of abdominal pain but now she is feeling better.  Today she is complaining of running eye.  She is not sure what happened but complaining of itching and watering in her right eye.  She is appointment to see the physician.  Overall she feels the Paxil is working her anxiety and depression.  She occasionally talk to her son who lives in her home who is now moving to [place].  Patient has a daughter who lives in [place].  Patient has another son who lives in [place] but there is not as much communication.  Patient is excepted now her family ties and very busy in their own family.  Patient is taking care of her elderly father who lives with her.  She had a good support from her husband.  Patient denies any irritability, anger, mania, crying spells or any feeling of hopelessness or worthlessness.  She likes to continue current dose of Paxil.  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  She likes her job.  Patient denies drinking or using any illegal substances.  Energy level is good.  Her appetite is okay.  Her weight is unchanged from the past.   Past Psychiatric History:Reviewed. Nohistory of psychiatric inpatient treatment, suicidal attempt, psychosis or any hallucination.History ofdepression  and anxiety.In the pasttooktrazodone with good response.    Recent Results (from the past 2160 hour(s))  Lipase, blood     Status: None   Collection Time: 02/24/19  7:31 AM  Result Value Ref Range   Lipase 34 11 - 51 U/L    Comment: Performed at [facility], [place] [address] [place], [place] [address]  Comprehensive metabolic panel     Status: Abnormal   Collection Time: 02/24/19  7:31 AM  Result Value Ref Range   Sodium 139 135 - 145 mmol/L   Potassium 3.8 3.5 - 5.1 mmol/L   Chloride 106 98 - 111 mmol/L   CO2 24 22 - 32 mmol/L   Glucose, Bld 110 (H) 70 - 99 mg/dL   BUN 16 6 - 20 mg/dL   Creatinine, Ser 0.60 0.44 - 1.00 mg/dL   Calcium 8.8 (L) 8.9 - 10.3 mg/dL   Total Protein 7.1 6.5 - 8.1 g/dL   Albumin 4.0 3.5 - 5.0 g/dL   AST 14 (L) 15 - 41 U/L   ALT 18 0 - 44 U/L   Alkaline Phosphatase 72 38 - 126 U/L   Total Bilirubin 0.5 0.3 - 1.2 mg/dL   GFR calc non Af Amer >60 >60 mL/min   GFR calc Af Amer >60 >60 mL/min   Anion gap 9 5 - 15    Comment: Performed at [facility], [place] [address] [place], [place] [address]  CBC     Status: None  Collection Time: 02/24/19  7:31 AM  Result Value Ref Range   WBC 6.5 4.0 - 10.5 K/uL   RBC 4.62 3.87 - 5.11 MIL/uL   Hemoglobin 13.6 12.0 - 15.0 g/dL   HCT 42.1 36.0 - 46.0 %   MCV 91.1 80.0 - 100.0 fL   MCH 29.4 26.0 - 34.0 pg   MCHC 32.3 30.0 - 36.0 g/dL   RDW 13.5 11.5 - 15.5 %   Platelets 232 150 - 400 K/uL   nRBC 0.0 0.0 - 0.2 %    Comment: Performed at [facility], [place] [address] [place], [place] [address]  Urinalysis, Routine w reflex microscopic     Status: None   Collection Time: 02/24/19  7:31 AM  Result Value Ref Range   Color, Urine YELLOW YELLOW   APPearance CLEAR CLEAR   Specific Gravity, Urine 1.014 1.005 - 1.030   pH 6.0 5.0 - 8.0   Glucose, UA NEGATIVE NEGATIVE mg/dL   Hgb urine dipstick NEGATIVE NEGATIVE   Bilirubin Urine NEGATIVE NEGATIVE   Ketones,  ur NEGATIVE NEGATIVE mg/dL   Protein, ur NEGATIVE NEGATIVE mg/dL   Nitrite NEGATIVE NEGATIVE   Leukocytes,Ua NEGATIVE NEGATIVE    Comment: Performed at [place] [address] [place], [place] [address]     Psychiatric Specialty Exam: Physical Exam  ROS  There were no vitals taken for this visit.There is no height or weight on file to calculate BMI.  General Appearance: NA  Eye Contact:  NA  Speech:  Clear and Coherent  Volume:  Normal  Mood:  Euthymic  Affect:  NA  Thought Process:  Goal Directed  Orientation:  Full (Time, Place, and Person)  Thought Content:  WDL and Logical  Suicidal Thoughts:  No  Homicidal Thoughts:  No  Memory:  Immediate;   Good Recent;   Good Remote;   Good  Judgement:  Good  Insight:  Good  Psychomotor Activity:  NA  Concentration:  Concentration: Good and Attention Span: Good  Recall:  Good  Fund of Knowledge:  Good  Language:  Good  Akathisia:  No  Handed:  Right  AIMS (if indicated):     Assets:  Communication Skills Desire for Improvement Housing Resilience Social Support  ADL's:  Intact  Cognition:  WNL  Sleep:   good      Assessment and Plan: Major depressive disorder, recurrent.  Anxiety.  Patient is a stable on her current medication.  Continue Paxil 40 mg daily.  She is not interested in therapy.  Recommended to call us back if she has any question of any concern.  Follow-up in 3 months.  Follow Up Instructions:    I discussed the assessment and treatment plan with the patient. The patient was provided an opportunity to ask questions and all were answered. The patient agreed with the plan and demonstrated an understanding of the instructions.   The patient was advised to call back or seek an in-person evaluation if the symptoms worsen or if the condition fails to improve as anticipated.  I provided 20 minutes of non-face-to-face time during this encounter.   Kathlee Nations, MD

## 2019-04-28 ENCOUNTER — Other Ambulatory Visit: Payer: Self-pay | Admitting: Optometry

## 2019-04-28 DIAGNOSIS — H4911 Fourth [trochlear] nerve palsy, right eye: Secondary | ICD-10-CM

## 2019-05-12 ENCOUNTER — Other Ambulatory Visit: Payer: No Typology Code available for payment source

## 2019-06-25 ENCOUNTER — Other Ambulatory Visit: Payer: Self-pay

## 2019-06-25 ENCOUNTER — Ambulatory Visit
Admission: RE | Admit: 2019-06-25 | Discharge: 2019-06-25 | Disposition: A | Discharge status: Home / Self Care | Payer: No Typology Code available for payment source | Source: Ambulatory Visit | Attending: Surgery | Admitting: Surgery

## 2019-06-25 DIAGNOSIS — H4911 Fourth [trochlear] nerve palsy, right eye: Secondary | ICD-10-CM

## 2019-06-25 MED ORDER — GADOBENATE DIMEGLUMINE 529 MG/ML IV SOLN
15.0000 mL | Freq: Once | INTRAVENOUS | Status: AC | PRN
  Administered 2019-06-25: 08:00:00 15 mL via INTRAVENOUS

## 2019-07-02 ENCOUNTER — Encounter (INDEPENDENT_AMBULATORY_CARE_PROVIDER_SITE_OTHER): Payer: Self-pay | Admitting: Otolaryngology

## 2019-07-02 ENCOUNTER — Other Ambulatory Visit: Payer: Self-pay

## 2019-07-02 ENCOUNTER — Ambulatory Visit (INDEPENDENT_AMBULATORY_CARE_PROVIDER_SITE_OTHER): Payer: No Typology Code available for payment source | Admitting: Otolaryngology

## 2019-07-02 VITALS — Temp 97.9°F

## 2019-07-02 DIAGNOSIS — H6123 Impacted cerumen, bilateral: Secondary | ICD-10-CM

## 2019-07-02 DIAGNOSIS — G510 Bell's palsy: Secondary | ICD-10-CM | POA: Diagnosis not present

## 2019-07-02 DIAGNOSIS — D490 Neoplasm of unspecified behavior of digestive system: Secondary | ICD-10-CM | POA: Diagnosis not present

## 2019-07-02 NOTE — Progress Notes (Signed)
HPI: Melissa Case is a 59 y.o. 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  Patient initially developed weakness of the right facial nerve in October of last year and was treated for a Bell's palsy however after she had no improvement of her facial nerve weakness she was subsequently scheduled for an MRI scan that was just recently completed and demonstrated heterogeneous enhancement of the mastoid segment of the facial nerve with abnormality of the right parotid gland and right jugular digastric lymphadenopathy suspicious for right parotid cancer.  She was subsequently referred here for further treatment. She denies any pain or discomfort in the parotid region or around the right ear. She is otherwise healthy and takes medication Paxil for anxiety and Nexium for GERD. She does smoke half a pack a day..  Past Medical History:  Diagnosis Date  . Depression   . GERD (gastroesophageal reflux disease)    Past Surgical History:  Procedure Laterality Date  . ABDOMINAL HYSTERECTOMY    . CHOLECYSTECTOMY N/A 11/21/2017   Procedure: LAPAROSCOPIC CHOLECYSTECTOMY WITH INTRAOPERATIVE CHOLANGIOGRAM;  Surgeon: Jovita Kussmaul, MD;  Location: [facility];  Service: General;  Laterality: N/A;   Social History   Socioeconomic History  . Marital status: Married    Spouse name: Not on file  . Number of children: Not on file  . Years of education: Not on file  . Highest education level: Not on file  Occupational History  . Not on file  Tobacco Use  . Smoking status: Current Every Day Smoker    Packs/day: 0.50    Years: 45.00    Pack years: 22.50    Types: Cigarettes    Start date: 78  . Smokeless tobacco: Never Used  Substance and Sexual Activity  . Alcohol use: No    Alcohol/week: 0.0 standard drinks  . Drug use: No  . Sexual activity: Yes    Birth control/protection: None  Other Topics Concern  . Not on file   Social History Narrative  . Not on file   Social Determinants of Health   Financial Resource Strain:   . Difficulty of Paying Living Expenses: Not on file  Food Insecurity:   . Worried About Charity fundraiser in the Last Year: Not on file  . Ran Out of Food in the Last Year: Not on file  Transportation Needs:   . Lack of Transportation (Medical): Not on file  . Lack of Transportation (Non-Medical): Not on file  Physical Activity:   . Days of Exercise per Week: Not on file  . Minutes of Exercise per Session: Not on file  Stress:   . Feeling of Stress : Not on file  Social Connections:   . Frequency of Communication with Friends and Family: Not on file  . Frequency of Social Gatherings with Friends and Family: Not on file  . Attends Religious Services: Not on file  . Active Member of Clubs or Organizations: Not on file  . Attends Archivist Meetings: Not on file  . Marital Status: Not on file   Family History  Problem Relation Age of Onset  . Depression Sister    Allergies  Allergen Reactions  . Penicillins Rash    Has patient had a PCN reaction causing immediate rash, facial/tongue/throat swelling, SOB or lightheadedness with hypotension: No Has patient had a PCN reaction causing severe rash involving mucus membranes or skin necrosis: No Has patient had  a PCN reaction that required hospitalization No Has patient had a PCN reaction occurring within the last 10 years: No If all of the above answers are "NO", then may proceed with Cephalosporin use.    Prior to Admission medications   Medication Sig Start Date End Date Taking? Authorizing Provider  esomeprazole (NEXIUM) 40 MG capsule 40 mg at bedtime. 05/10/15  Yes Smothers, Andree Elk, NP  methylPREDNISolone (MEDROL DOSEPAK) 4 MG TBPK tablet Take 4 mg by mouth See admin instructions. 6 day dosepak 02/17/19  Yes [provider]  neomycin-polymyxin b-dexamethasone (MAXITROL) 3.5-10000-0.1 [place] 1  application into both eyes at bedtime. 02/17/19  Yes [provider]  PARoxetine (PAXIL) 40 MG tablet TAKE 1 TABLET BY MOUTH (40MG  TOTAL) EVERY MORNING 04/23/19  Yes Arfeen, Arlyce Harman, MD  prednisoLONE acetate (PRED FORTE) 1 % ophthalmic suspension Place 1 drop into both eyes 4 (four) times daily. Qid for one week, then bid for two weeks 02/17/19  Yes [provider]  sulfamethoxazole-trimethoprim (BACTRIM DS) 800-160 MG tablet Take 1 tablet by mouth 2 (two) times daily. for 7 days 02/17/19  Yes [provider]     Positive ROS: No headaches.  No hearing change or dizziness.  All other systems have been reviewed and were otherwise negative with the exception of those mentioned in the HPI and as above.  Physical Exam: Constitutional: Alert, well-appearing, no acute distress Ears: External ears without lesions or tenderness.  She had a large amount of wax obstructing both ear canals that was cleaned with curettes and suction.  TMs were clear bilaterally. Nasal: External nose without lesions.. Clear nasal passages. Oral: Lips and gums without lesions. Tongue and palate mucosa without lesions. Posterior oropharynx clear. Neck: She has some slight fullness to palpation underneath the mandible on the right side compared to the left but she has minimal swelling of the parotid gland over the masseter muscle and preauricular. 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  Plan: We will plan on referring this to [facility] as this will require parotidectomy neck dissection and probable partial temporal bone resection with facial nerve grafting.   Radene Journey, MD   CC:

## 2019-07-03 ENCOUNTER — Telehealth (INDEPENDENT_AMBULATORY_CARE_PROVIDER_SITE_OTHER): Payer: Self-pay

## 2019-07-14 DIAGNOSIS — K119 Disease of salivary gland, unspecified: Secondary | ICD-10-CM | POA: Insufficient documentation

## 2019-07-14 DIAGNOSIS — G510 Bell's palsy: Secondary | ICD-10-CM | POA: Insufficient documentation

## 2019-07-21 ENCOUNTER — Other Ambulatory Visit: Payer: Self-pay

## 2019-07-21 ENCOUNTER — Ambulatory Visit (INDEPENDENT_AMBULATORY_CARE_PROVIDER_SITE_OTHER): Payer: No Typology Code available for payment source | Admitting: Psychiatry

## 2019-07-21 ENCOUNTER — Encounter (HOSPITAL_COMMUNITY): Payer: Self-pay | Admitting: Psychiatry

## 2019-07-21 DIAGNOSIS — F330 Major depressive disorder, recurrent, mild: Secondary | ICD-10-CM | POA: Diagnosis not present

## 2019-07-21 DIAGNOSIS — F419 Anxiety disorder, unspecified: Secondary | ICD-10-CM

## 2019-07-21 MED ORDER — TRAZODONE HCL 50 MG PO TABS: ORAL_TABLET | ORAL | 0 refills | Status: DC

## 2019-07-21 MED ORDER — PAROXETINE HCL 40 MG PO TABS: ORAL_TABLET | ORAL | 0 refills | Status: DC

## 2019-07-21 NOTE — Progress Notes (Signed)
Virtual Visit via Telephone Note  I connected with Melissa Case on 07/21/19 at 11:40 AM EST by telephone and verified that I am speaking with the correct person using two identifiers.   I discussed the limitations, risks, security and privacy concerns of performing an evaluation and management service by telephone and the availability of in person appointments. I also discussed with the patient that there may be a patient responsible charge related to this service. The patient expressed understanding and agreed to proceed.   History of Present Illness: Patient was evaluated by phone session.  She has some anxiety because of her health condition.  Recently she had eye drooping and she is seeing eye doctor.  Apparently she was told there is a mass and tomorrow she is going to remove that mass.  She was also told if mass turned out to be a tumor then she may require any surgery.  She admitted feeling anxious and nervous and took some leftover trazodone to go to sleep.  Currently she is not working because she had Covid test and hoping to have a surgery and she cannot go outside and have to have a quarantine before procedure.  Overall she is in good spirits.  Her daughter came from [place] on the holidays and she had a good time.  She had a good support from her husband.  Her youngest son lives in [place] who is also supportive.  Patient denies any irritability, anger, crying spells or any feeling of hopelessness or worthlessness.  Selective continue Paxil and wondering if she can have trazodone as needed for insomnia which also helps her anxiety.   Past Psychiatric History:Reviewed. Nohistory of psychiatric inpatient treatment, suicidal attempt, psychosis or any hallucination.History ofdepression and anxiety.In the pasttooktrazodone with good response.   Psychiatric Specialty Exam: Physical Exam  Review of Systems  There were no vitals taken for this visit.There is no height or weight on  file to calculate BMI.  General Appearance: NA  Eye Contact:  NA  Speech:  Clear and Coherent  Volume:  Normal  Mood:  Anxious  Affect:  NA  Thought Process:  Goal Directed  Orientation:  Full (Time, Place, and Person)  Thought Content:  Logical  Suicidal Thoughts:  No  Homicidal Thoughts:  No  Memory:  Immediate;   Good Recent;   Good Remote;   Good  Judgement:  Good  Insight:  Present  Psychomotor Activity:  NA  Concentration:  Concentration: Good and Attention Span: Good  Recall:  Good  Fund of Knowledge:  Good  Language:  Good  Akathisia:  No  Handed:  Right  AIMS (if indicated):     Assets:  Communication Skills Desire for Improvement Housing Resilience Social Support  ADL's:  Intact  Cognition:  WNL  Sleep:   fair     Assessment and Plan: Major depressive disorder, recurrent.  Anxiety.  Discuss upcoming procedure to remove the mass.  Reassurance given.  Patient is in high spirits but like to have trazodone 50 mg half tablet as needed for insomnia which helps also her anxiety.  Continue Paxil 40 mg daily.  Recommended therapy but she does not feel she needed.  I recommended to call us back if she has any question or any concern.  Follow-up in 3 months.  Follow Up Instructions:    I discussed the assessment and treatment plan with the patient. The patient was provided an opportunity to ask questions and all were answered. The patient agreed with the  plan and demonstrated an understanding of the instructions.   The patient was advised to call back or seek an in-person evaluation if the symptoms worsen or if the condition fails to improve as anticipated.  I provided 20 minutes of non-face-to-face time during this encounter.   Kathlee Nations, MD

## 2019-07-22 ENCOUNTER — Ambulatory Visit (HOSPITAL_COMMUNITY): Payer: Self-pay | Admitting: Psychiatry

## 2019-07-22 HISTORY — PX: PAROTIDECTOMY: SUR1003

## 2019-07-25 DIAGNOSIS — M270 Developmental disorders of jaws: Secondary | ICD-10-CM

## 2019-07-25 DIAGNOSIS — K030 Excessive attrition of teeth: Secondary | ICD-10-CM

## 2019-07-25 DIAGNOSIS — K036 Deposits [accretions] on teeth: Secondary | ICD-10-CM

## 2019-07-25 DIAGNOSIS — K08409 Partial loss of teeth, unspecified cause, unspecified class: Secondary | ICD-10-CM

## 2019-07-25 DIAGNOSIS — K029 Dental caries, unspecified: Secondary | ICD-10-CM

## 2019-07-25 DIAGNOSIS — M2632 Excessive spacing of fully erupted teeth: Secondary | ICD-10-CM

## 2019-07-25 DIAGNOSIS — K045 Chronic apical periodontitis: Secondary | ICD-10-CM

## 2019-07-25 DIAGNOSIS — K06010 Localized gingival recession, unspecified: Secondary | ICD-10-CM

## 2019-07-25 DIAGNOSIS — M264 Malocclusion, unspecified: Secondary | ICD-10-CM

## 2019-07-25 DIAGNOSIS — K0530 Chronic periodontitis, unspecified: Secondary | ICD-10-CM

## 2019-07-25 DIAGNOSIS — K083 Retained dental root: Secondary | ICD-10-CM

## 2019-08-19 NOTE — Progress Notes (Signed)
Radiation Oncology         [phone] ________________________________  Initial outpatient Consultation by telephone.  The patient opted for telemedicine to maximize safety during the pandemic.  MyChart video was not obtainable.   Name: Melissa Case MRN: [id number]  Date: 08/20/2019  DOB: [birth year]  [id number], No Pcp Per  Fredricka Bonine, *   REFERRING PHYSICIAN: Fredricka Bonine, *  DIAGNOSIS: C07    ICD-10-CM   1. 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 Cancer of parotid gland [facility])  C07 Ambulatory referral to Social Work    Amb Referral to Nutrition and Diabetic E    Ambulatory referral to Dentistry    Cancer Staging Cancer of parotid gland [facility]) Staging form: Major Salivary Glands, AJCC 8th Edition - Pathologic stage from 08/20/2019: pT3, pN2b - Signed by Eppie Gibson, MD on 08/20/2019   CHIEF COMPLAINT: Here to discuss management of salivary duct cancer  HISTORY OF PRESENT ILLNESS::Melissa Case is a 59 y.o. female who presented with right ptosis, tearing/ mouth droop/right facial nerve weakness in 02/2019. She was treated for Bell's palsy with no improvement, so she proceeded to brain MRI. Performed on 06/25/2019, this showed: worrisome asymmetry of right parotid gland; asymmetric abnormal enhancement at mastoid segment of facial nerve; adenopathy in right jugulodigastric station.  She first saw Dr. Lucia Gaskins of ENT and subsequently, the patient saw Dr. Conley Canal who performed right parotidectomy and lymphadenectomy on 07/22/2019. Pathology from the procedure revealed: high grade salivary duct carcinoma, of the parotid gland, 1.2 cm; resection margins negative; stylomastoid foramen biopsy positive for carcinoma; both right level 2A lymph nodes positive for metastatic carcinoma, all others negative (2/35); stage pT3, pN2b.  She last saw Dr. Conley Canal for follow up on 08/13/2019.   Tobacco history, if any: current smoker  ETOH abuse, if any:  none  Prior cancers, if any: none  Biopsies revealed:  07/22/2019 FINAL PATHOLOGIC DIAGNOSIS  MICROSCOPIC EXAMINATION AND DIAGNOSIS  A. "RIGHT LEVEL 2A FROZEN", LYMPHADENECTOMY:  Metastatic carcinoma involving two of two lymph nodes (2/2).  - The largest focus of metastasis: 2.2 cm  - No extranodal extension identified.    B. "RIGHT LEVEL 2B LYMPH NODE", LYMPHADENECTOMY:  Thirteen benign lymph nodes (0/13).  Benign salivary gland tissue.    C. "RIGHT LEVEL 2A AND 3 LYMPH NODE", LYMPHADENECTOMY:  Twenty benign lymph nodes (0/20).    D. "STYLOMASTOID FORAMEN #1", BIOPSY:  Positive for carcinoma.    E. "FACIAL NERVE AT STYLOMASTOID FORAMEN", BIOPSY:  No evidence of carcinoma.    F. "STYLOMASTOID FORAMEN #2", BIOPSY:  No evidence of carcinoma (confirmed by CK7, cyclinD1 and  GATA3 immunostains).    G. "RIGHT TOTAL PAROTIDECTOMY":  High grade salivary gland carcinoma, of the parotid gland.  Greatest dimension 1.2 cm.  All resection margins are negative for tumor.  No definite lymphovascular or perineural invasion  identified.  Background salivary gland show multiple benign nodules with  sclerosing adenosis-like growth pattern.    Nutrition Status Yes No Comments  Weight changes? []  [x]    Swallowing concerns? []  [x]  Had difficulty eat during early diagnosis, but has since improved - eating all types of foods  PEG? []  [x]     Referrals Yes No Comments  Social Work? []  [x]    Dentistry? []  [x]    Swallowing therapy? []  [x]    Nutrition? []  [x]    Med/Onc? []  [x]     Safety Issues Yes No Comments  Prior radiation? []  [x]    Pacemaker/ICD? []  [x]   Possible current pregnancy? []  [x]    Is the patient on methotrexate? []  [x]     Tobacco/Marijuana/Snuff/ETOH use: Patient smokes about half a pack of cigarettes daily for the past 45 years. Does not use alcohol or illicit  drugs.  Current Complaints / other details:   Scheduled for PET scan on 08/29/19 at [facility]. Patient is still dealing with nerve pain along the right side of her face and throbbing to the right ear. Per patient Dr. Conley Canal expects these issues to resolve once affected nerve recovers.    She has a gold weight in her eye lid that has helped a lot per patient.  She continues to have right facial droop . MBSS tomorrow.  She still has her teeth.  She has not received her Covid vaccine yet.  She is an Print production planner.  Her husband is as well.   PREVIOUS RADIATION THERAPY: No  PAST MEDICAL HISTORY:  has a past medical history of Depression and GERD (gastroesophageal reflux disease).    PAST SURGICAL HISTORY: Past Surgical History:  Procedure Laterality Date  . ABDOMINAL HYSTERECTOMY    . CHOLECYSTECTOMY N/A 11/21/2017   Procedure: LAPAROSCOPIC CHOLECYSTECTOMY WITH INTRAOPERATIVE CHOLANGIOGRAM;  Surgeon: Jovita Kussmaul, MD;  Location: [facility] ORS;  Service: General;  Laterality: N/A;  . PAROTIDECTOMY Right 07/22/2019   with biopsy; done at [facility] by Dr. Fredricka Bonine    FAMILY HISTORY: family history includes Breast cancer in her paternal grandmother; Depression in her sister; Diabetes in her father; Heart disease in her mother.  SOCIAL HISTORY:  reports that she has been smoking cigarettes. She started smoking about 43 years ago. She has a 22.50 pack-year smoking history. She has never used smokeless tobacco. She reports that she does not drink alcohol or use drugs.  ALLERGIES: Morphine and Penicillins  MEDICATIONS:  Current Outpatient Medications  Medication Sig Dispense Refill  . carboxymethylcellulose (REFRESH PLUS) 0.5 % SOLN Place 1 drop into the right eye as needed.    Marland Kitchen esomeprazole (NEXIUM) 40 MG capsule 40 mg at bedtime.    Marland Kitchen PARoxetine (PAXIL) 40 MG tablet TAKE 1 TABLET BY MOUTH (40MG  TOTAL) EVERY MORNING 90 tablet 0  . traZODone (DESYREL) 50 MG tablet Take 1/2 to one tab as needed  for insomnia. 20 tablet 0   No current facility-administered medications for this encounter.    REVIEW OF SYSTEMS:  Notable for that above.   PHYSICAL EXAM:  vitals were not taken for this visit.   General: Alert and oriented, in no acute distress   LABORATORY DATA:  Lab Results  Component Value Date   WBC 6.5 02/24/2019   HGB 13.6 02/24/2019   HCT 42.1 02/24/2019   MCV 91.1 02/24/2019   PLT 232 02/24/2019   CMP     Component Value Date/Time   NA 139 02/24/2019 0731   NA 140 07/23/2017 0820   K 3.8 02/24/2019 0731   CL 106 02/24/2019 0731   CO2 24 02/24/2019 0731   GLUCOSE 110 (H) 02/24/2019 0731   BUN 16 02/24/2019 0731   BUN 11 07/23/2017 0820   CREATININE 0.60 02/24/2019 0731   CALCIUM 8.8 (L) 02/24/2019 0731   PROT 7.1 02/24/2019 0731   PROT 7.0 07/23/2017 0820   ALBUMIN 4.0 02/24/2019 0731   ALBUMIN 4.4 07/23/2017 0820   AST 14 (L) 02/24/2019 0731   ALT 18 02/24/2019 0731   ALKPHOS 72 02/24/2019 0731   BILITOT 0.5 02/24/2019 0731   BILITOT 0.2 07/23/2017 0820   GFRNONAA >60 02/24/2019  [id number]   GFRAA >60 02/24/2019 0731      Lab Results  Component Value Date   TSH 0.934 07/23/2017     RADIOGRAPHY: I have personally reviewed her MRI      IMPRESSION/PLAN:  This is a delightful patient with 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 I recommend radiotherapy for this patient.  She understands her radiotherapy would be given 5 days a week for about 6 to 7 weeks.  We discussed the potential risks, benefits, and side effects of radiotherapy. We talked in detail about acute and late effects. We discussed that some of the most bothersome acute effects may be mucositis, dysgeusia, salivary changes, skin irritation, hair loss, nutritional issues. fatigue. We talked about late effects which include but are not necessarily limited to dysphagia, soft tissue injury, hearing loss, xerostomia.  She understands that we will treat to the base of skull  and so relatively small portion of her brain will be exposed to radiation.  No guarantees of treatment were given The patient is enthusiastic about proceeding with treatment. I look forward to participating in the patient's care.    PET scan is pending next week.  Simulation (treatment planning) will take place soon after that.  We also discussed that the treatment of head and neck cancer is a multidisciplinary process to maximize treatment outcomes and quality of life. For this reason the following referrals have been or will be made:  Dentistry for dental evaluation, advice on reducing risk of cavities, osteoradionecrosis, or other oral issues.  Nutritionist for nutrition support during and after treatment.  Social work for social support.   Audiology test.  Today, I offered to schedule the patient and her husband for Covid vaccines.  They accepted this offer.  They have been scheduled to receive their vaccines through [facility] on April 2 at the [facility].  This encounter was provided by telemedicine platform by telephone.  The patient opted for telemedicine to maximize safety during the pandemic.  MyChart video was not obtainable. The patient has given verbal consent for this type of encounter and has been advised to only accept a meeting of this type in a secure network environment. On date of service, in total, I spent 70 minutes on this encounter. The attendants for this meeting include Eppie Gibson  and [name].  During the encounter, Eppie Gibson was located at [facility] Radiation Oncology Department.  Chania Ekdahl was located at home.     __________________________________________   Eppie Gibson, MD   This document serves as a record of services personally performed by Eppie Gibson, MD. It was created on her behalf by Wilburn Mylar, a trained medical scribe. The creation of this record is based on the scribe's personal observations  and the provider's statements to them. This document has been checked and approved by the attending provider.

## 2019-08-19 NOTE — Progress Notes (Signed)
Oncology Nurse Navigator Documentation  Placed introductory call to new referral patient .....  Introduced myself as the H&N oncology nurse navigator that works with Dr. Isidore Moos to whom she has been referred by Dr. Conley Canal. She confirmed understanding of referral.  Briefly explained my role as his navigator, provided my contact information.   Confirmed understanding of upcoming appts and [place] location, explained arrival and registration process.  I explained the purpose of a dental evaluation prior to starting RT, indicated she might be contacted by [facility] to arrange an appt based on consultation with Dr. Isidore Moos.   I encouraged her to call with questions/concerns as she moves forward with appts and procedures.    She verbalized understanding of information provided, expressed appreciation for my call.   Navigator Initial Assessment . Employment Status: She is employed, working full time.  . Currently on FMLA / STD: Yes . Living Situation: She lives with her husband, [name].  . Support System: Her husband, Richard . PCP: She does not currently have a PCP. She plans to contact one ASAP.  Marland Kitchen PCD: She does not currently have a Tourist information centre manager. She does plan to contact one ASAP to set up an appointment.  . Financial Concerns: She denies concerns at this time.  . Transportation Needs: no . Sensory Deficits: no . Language Barriers/Interpreter Needed:  no . Ambulation Needs: no . DME Used in Home: no . Psychosocial Needs:  no . Concerns/Needs Understanding Cancer:  addressed/answered by navigator to best of ability . Self-Expressed Needs: no

## 2019-08-19 NOTE — Progress Notes (Signed)
Head and Neck Cancer Location of Tumor / Histology: Salivary duct cancer  Per Dr. Gaetana Michaelis note on 07/09/2019: Patient initially presented with symptoms of  weakness of the right face begininning in October 2020, at which time she presented to the Emergency Department and was diagnosed with Bell's Palsy. She denies any improvement of her face weakness following high dose steroids. She underwent MRI Head w/o contrast on 2/3 demonstrating "asymmetry of the right parotid gland with heterogenous enhancement below the stylomastoid foramen and enhancement of the mastoid segment of the facial nerve concerning for perineural tumor invasion. Right jugulodigastric adenopathy is also demonstrated."   Biopsies revealed:  07/22/2019 FINAL PATHOLOGIC DIAGNOSIS  MICROSCOPIC EXAMINATION AND DIAGNOSIS  A. "RIGHT LEVEL 2A FROZEN", LYMPHADENECTOMY:  Metastatic carcinoma involving two of two lymph nodes (2/2).  - The largest focus of metastasis: 2.2 cm  - No extranodal extension identified.    B. "RIGHT LEVEL 2B LYMPH NODE", LYMPHADENECTOMY:  Thirteen benign lymph nodes (0/13).  Benign salivary gland tissue.    C. "RIGHT LEVEL 2A AND 3 LYMPH NODE", LYMPHADENECTOMY:  Twenty benign lymph nodes (0/20).    D. "STYLOMASTOID FORAMEN #1", BIOPSY:  Positive for carcinoma.    E. "FACIAL NERVE AT STYLOMASTOID FORAMEN", BIOPSY:  No evidence of carcinoma.    F. "STYLOMASTOID FORAMEN #2", BIOPSY:  No evidence of carcinoma (confirmed by CK7, cyclinD1 and  GATA3 immunostains).    G. "RIGHT TOTAL PAROTIDECTOMY":  High grade salivary gland carcinoma, of the parotid gland.  Greatest dimension 1.2 cm.  All resection margins are negative for tumor.  No definite lymphovascular or perineural invasion  identified.  Background salivary gland show multiple benign nodules with  sclerosing adenosis-like  growth pattern.    Nutrition Status Yes No Comments  Weight changes? []  [x]    Swallowing concerns? []  [x]  Had difficulty eat during early prognosis, but has since improved  PEG? []  [x]     Referrals Yes No Comments  Social Work? []  [x]    Dentistry? []  [x]    Swallowing therapy? []  [x]    Nutrition? []  [x]    Med/Onc? []  [x]     Safety Issues Yes No Comments  Prior radiation? []  [x]    Pacemaker/ICD? []  [x]    Possible current pregnancy? []  [x]    Is the patient on methotrexate? []  [x]     Tobacco/Marijuana/Snuff/ETOH use: Patient smokes about half a pack of cigarettes daily for the past 45 years. Does not use alcohol or illicit drugs.  Past/Anticipated interventions by otolaryngology, if any:  F/U with Dr. Fredricka Bonine on 08/13/2019    MBS pre-radiation (per patient scheduled for Thurs 08/21/19 at [facility])    F/U x 6 weeks with me    Eye care and wound care discussed  Past/Anticipated interventions by medical oncology, if any: N/A  Current Complaints / other details:   Scheduled for PET scan on 08/29/19 at [facility]. Patient is still dealing with nerve pain along the right side of her face and throbbing to the right ear. Per patient Dr. Conley Canal expects these issues to resolve once effected nerve recovers.

## 2019-08-20 ENCOUNTER — Encounter: Payer: Self-pay | Admitting: Radiation Oncology

## 2019-08-20 ENCOUNTER — Ambulatory Visit
Admission: RE | Admit: 2019-08-20 | Discharge: 2019-08-20 | Disposition: A | Discharge status: Home / Self Care | Payer: No Typology Code available for payment source | Source: Ambulatory Visit | Attending: Radiation Oncology | Admitting: Radiation Oncology

## 2019-08-20 ENCOUNTER — Other Ambulatory Visit: Payer: Self-pay

## 2019-08-20 ENCOUNTER — Telehealth: Payer: Self-pay | Admitting: Nutrition

## 2019-08-20 DIAGNOSIS — C089 Malignant neoplasm of major salivary gland, unspecified: Secondary | ICD-10-CM

## 2019-08-20 DIAGNOSIS — C07 Malignant neoplasm of parotid gland: Secondary | ICD-10-CM | POA: Insufficient documentation

## 2019-08-20 NOTE — Telephone Encounter (Signed)
Scheduled appt per 3/31 sch message - pt aware of appt date and time

## 2019-08-20 NOTE — Progress Notes (Signed)
Oncology Nurse Navigator Documentation  Per Dr. Pearlie Oyster referral today to have patient complete a pre-radiation audiology exam, I have arranged an appointment with [facility] on 09/02/19 at 9:30 am. I have advised Ms. Veneziano of the appointment and she is agreeable to the date and time. She knows to call me if she has any further questions.

## 2019-08-20 NOTE — Progress Notes (Signed)
Dental Form with Estimates of Radiation Dose      Diagnosis:    ICD-10-CM   1. Localized cancer of salivary gland or duct ([place])  C08.9   2. Cancer of parotid gland [facility])  C07 Ambulatory referral to Social Work    Amb Referral to Nutrition and Diabetic E    Ambulatory referral to Dentistry    Prognosis: curative  Anticipated # of fractions: 30-33    Daily?: yes  # of weeks of radiotherapy: 6-7  Chemotherapy?: no  Anticipated xerostomia:  Mild permanent    Pre-simulation needs: ? Scatter protection   Simulation: Approx April 12 -- no extractions, please  Other Notes:   Please contact Eppie Gibson, MD, with patient's disposition after evaluation and/or dental treatment.

## 2019-08-21 ENCOUNTER — Telehealth (HOSPITAL_COMMUNITY): Payer: Self-pay

## 2019-08-21 ENCOUNTER — Encounter: Payer: Self-pay | Admitting: General Practice

## 2019-08-21 NOTE — Progress Notes (Signed)
[facility] CSW Progress Notes  Referral received from Dr Isidore Moos, patient is newly diagnosed w head/neck cancer.  Called patient, no answer. Left VM w my contact information and request to call back.  Edwyna Shell, LCSW Clinical Social Worker Phone:  [phone] Cell:  [phone]

## 2019-08-21 NOTE — Telephone Encounter (Signed)
I called to schedule patient for New OP Consult with Dental Medicine. LMOM for patient to return call to schedule appt.

## 2019-08-22 ENCOUNTER — Encounter: Payer: Self-pay | Admitting: General Practice

## 2019-08-22 ENCOUNTER — Ambulatory Visit: Payer: No Typology Code available for payment source | Attending: Internal Medicine

## 2019-08-22 DIAGNOSIS — Z23 Encounter for immunization: Secondary | ICD-10-CM

## 2019-08-22 NOTE — Progress Notes (Signed)
[place] Initial Psychosocial Assessment Clinical Social Work  Clinical Social Work contacted by phone to assess psychosocial, emotional, mental health, and spiritual needs of the patient.   Barriers to care/review of distress screen:  - Transportation:  Do you anticipate any problems getting to appointments?  Do you have someone who can help run errands for you if you need it?  Wont have difficulty at beginning, may need help w transportation at end of radiation.   - Help at home:  What is your living situation (alone, family, other)?  If you are physically unable to care for yourself, who would you call on to help you?  Lives w husband.  - Support system:  What does your support system look like?  Who would you call on if you needed some kind of practical help?  What if you needed someone to talk to for emotional support?  Youngest son lives in [place], several coworkers who are helpful/supportive.  Other children live out of town.   - Finances:  Are you concerned about finances.  Considering returning to work?  If not, applying for disability?  Patient wants to work as long as she can during radiation.  Currently out on short term disability, also has PTO time to use.  Has been out since mid Feb due to need to quarantine prior to surgery.  Trying to plan w husband how to maximize work hours and complete treatments.  FMLA has been filed at work, beginning March 2.  Works in Proofreader, does pick/pack.  Lifts 50 pounds but usually does computer work.  Job has been good working w her thus far.  Hopes to have treatments as early as possible in mornings, then she can work second shift.    What is your understanding of where you are with your cancer? Its cause?  Your treatment plan and what happens next? Became concerned when right eye and right side of face drooped.  Initially went to eye doctor due to eye tearing up badly and not closing all the way.  Eventually found tumor entangled in facial nerve and  diagnosed w cancer of parotid gland.  Had surgery on March 2nd to remove parotid gland.  Also removed "as many lymph nodes as possible."  Will have PET scan to determine if cancer is anywhere else in body.  Will have 6 weeks of radiation to prevent reoccurrence.  Multiple scans, tests, appointments related to cancer treatment.  Is able to eat/drink with no issues at present.  Takes one thing at a time/one day at a time.  "Im a fighter, Ill get through this."    What are your worries for the future as you begin treatment for cancer?  Being able to continue to work while in treatment.  "Getting through it, getting it behind me."  Surgery was "the hardest part", very painful.   What are your hopes and priorities during your treatment? What is important to you? What are your goals for your care?  Needs early morning appointments if possible.    CSW Summary:  Patient and family psychosocial functioning including strengths, limitations, and coping skills:  Patient is a 59 year old female, newly diagnosed w parotid gland cancer.  Initially thought she had Bells palsy.  Has had surgery and will now have 6 weeks of radiation therapy.  CSW and patient discussed common feeling and emotions when being diagnosed with cancer, and the importance of support during treatment. CSW informed patient of the support team and support  services at [facility]. CSW provided contact information and encouraged patient to call with any questions or concerns.  Glad that she can have radiation done locally - had surgery at [place] of barriers to care:  None noted  Availability of community resources:  None needed.    Clinical Social Worker follow up needed: No.  Please reconsult if needed.  Edwyna Shell, LCSW Clinical Social Worker Phone:  [phone] Cell:  [phone]

## 2019-08-22 NOTE — Progress Notes (Signed)
   Covid-19 Vaccination Clinic  Name:  Melissa Case    MRN: [id number] DOB: 1961/02/10  08/22/2019  Melissa Case was observed post Covid-19 immunization for 15 minutes without incident. She was provided with Vaccine Information Sheet and instruction to access the V-Safe system.   Melissa Case was instructed to call 911 with any severe reactions post vaccine: Marland Kitchen Difficulty breathing  . Swelling of face and throat  . A fast heartbeat  . A bad rash all over body  . Dizziness and weakness   Immunizations Administered    Name Date Dose VIS Date Route   Pfizer COVID-19 Vaccine 08/22/2019 12:55 PM 0.3 mL 05/02/2019 Intramuscular   Manufacturer: [employer]   Lot: [id number]   [place]: [id number]

## 2019-08-25 ENCOUNTER — Other Ambulatory Visit: Payer: Self-pay

## 2019-08-25 ENCOUNTER — Encounter (HOSPITAL_COMMUNITY): Payer: Self-pay | Admitting: Dentistry

## 2019-08-25 ENCOUNTER — Ambulatory Visit (HOSPITAL_COMMUNITY): Payer: Self-pay | Admitting: Dentistry

## 2019-08-25 VITALS — BP 128/68 | HR 64 | Temp 98.6°F

## 2019-08-25 DIAGNOSIS — K08409 Partial loss of teeth, unspecified cause, unspecified class: Secondary | ICD-10-CM

## 2019-08-25 DIAGNOSIS — K036 Deposits [accretions] on teeth: Secondary | ICD-10-CM

## 2019-08-25 DIAGNOSIS — M264 Malocclusion, unspecified: Secondary | ICD-10-CM

## 2019-08-25 DIAGNOSIS — K0530 Chronic periodontitis, unspecified: Secondary | ICD-10-CM

## 2019-08-25 DIAGNOSIS — M2632 Excessive spacing of fully erupted teeth: Secondary | ICD-10-CM

## 2019-08-25 DIAGNOSIS — K06010 Localized gingival recession, unspecified: Secondary | ICD-10-CM

## 2019-08-25 DIAGNOSIS — Z01818 Encounter for other preprocedural examination: Secondary | ICD-10-CM

## 2019-08-25 DIAGNOSIS — C07 Malignant neoplasm of parotid gland: Secondary | ICD-10-CM

## 2019-08-25 DIAGNOSIS — K083 Retained dental root: Secondary | ICD-10-CM

## 2019-08-25 DIAGNOSIS — K029 Dental caries, unspecified: Secondary | ICD-10-CM

## 2019-08-25 DIAGNOSIS — K0889 Other specified disorders of teeth and supporting structures: Secondary | ICD-10-CM

## 2019-08-25 DIAGNOSIS — M270 Developmental disorders of jaws: Secondary | ICD-10-CM

## 2019-08-25 DIAGNOSIS — K045 Chronic apical periodontitis: Secondary | ICD-10-CM

## 2019-08-25 NOTE — Progress Notes (Signed)
DENTAL CONSULTATION  Date of Consultation:  08/25/2019 Patient Name:   Melissa Case Date of Birth:   05-14-1961 Medical Record Number: [id number]  COVID 19 SCREENING: The patient does not symptoms concerning for COVID-19 infection (Including fever, chills, cough, or new SHORTNESS OF BREATH).    VITALS: BP 128/68 (BP Location: Right Arm)   Pulse 64   Temp 98.6 F (37 C)   CHIEF COMPLAINT: Patient referred by Dr. Isidore Moos for dental consultation.  HPI: Melissa Case is a 59 year old female recently diagnosed with parotid cancer.  Patient is status post right parotidectomy with Dr. Conley Canal at [facility].  Patient with anticipated postoperative radiation therapy.  Patient is now seen as part of medically necessary preradiation therapy dental protocol examination.  The patient currently denies acute toothaches, swellings, or abscesses.  Patient was last seen by dentist approximately 5 years ago.  Patient had been treatment plan for extraction remaining teeth at that time.  Patient, however, did not follow-up with that dental treatment.  Patient currently denies having partial dentures.  Patient denies having dental phobia.  PROBLEM LIST: Patient Active Problem List   Diagnosis Date Noted  . 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 Acute calculous cholecystitis 11/21/2017  . Abdominal pain 09/30/2013  . SBO (small bowel obstruction) ([place]) 09/30/2013  . 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 Gall stone 04/05/2012  . Depression 09/15/2011    PMH: Past Medical History:  Diagnosis Date  . Depression   . GERD (gastroesophageal reflux disease)     PSH: Past Surgical History:  Procedure Laterality Date  . ABDOMINAL HYSTERECTOMY    . CHOLECYSTECTOMY N/A 11/21/2017   Procedure: LAPAROSCOPIC CHOLECYSTECTOMY WITH INTRAOPERATIVE CHOLANGIOGRAM;  Surgeon: Jovita Kussmaul, MD;  Location: [facility] ORS;  Service: General;  Laterality: N/A;  . PAROTIDECTOMY Right 07/22/2019   with  biopsy; done at [facility] by Dr. Fredricka Bonine    ALLERGIES: Allergies  Allergen Reactions  . Morphine Itching  . Penicillins Rash    Has patient had a PCN reaction causing immediate rash, facial/tongue/throat swelling, SOB or lightheadedness with hypotension: No Has patient had a PCN reaction causing severe rash involving mucus membranes or skin necrosis: No Has patient had a PCN reaction that required hospitalization No Has patient had a PCN reaction occurring within the last 10 years: No If all of the above answers are "NO", then may proceed with Cephalosporin use.     MEDICATIONS: Current Outpatient Medications  Medication Sig Dispense Refill  . carboxymethylcellulose (REFRESH PLUS) 0.5 % SOLN Place 1 drop into the right eye as needed.    Marland Kitchen esomeprazole (NEXIUM) 40 MG capsule 40 mg at bedtime.    Marland Kitchen PARoxetine (PAXIL) 40 MG tablet TAKE 1 TABLET BY MOUTH (40MG  TOTAL) EVERY MORNING 90 tablet 0  . traZODone (DESYREL) 50 MG tablet Take 1/2 to one tab as needed for insomnia. 20 tablet 0   No current facility-administered medications for this visit.    LABS: Lab Results  Component Value Date   WBC 6.5 02/24/2019   HGB 13.6 02/24/2019   HCT 42.1 02/24/2019   MCV 91.1 02/24/2019   PLT 232 02/24/2019      Component Value Date/Time   NA 139 02/24/2019 0731   NA 140 07/23/2017 0820   K 3.8 02/24/2019 0731   CL 106 02/24/2019 0731   CO2 24 02/24/2019 0731   GLUCOSE 110 (H) 02/24/2019 0731   BUN 16 02/24/2019 0731   BUN 11 07/23/2017 0820  CREATININE 0.60 02/24/2019 0731   CALCIUM 8.8 (L) 02/24/2019 0731   GFRNONAA >60 02/24/2019 0731   GFRAA >60 02/24/2019 0731   No results found for: INR, PROTIME No results found for: PTT  SOCIAL HISTORY: Social History   Socioeconomic History  . Marital status: Married    Spouse name: Not on file  . Number of children: Not on file  . Years of education: Not on file  . Highest education level: Not on file  Occupational  History  . Not on file  Tobacco Use  . Smoking status: Current Every Day Smoker    Packs/day: 0.50    Years: 45.00    Pack years: 22.50    Types: Cigarettes    Start date: 32  . Smokeless tobacco: Never Used  Substance and Sexual Activity  . Alcohol use: No    Alcohol/week: 0.0 standard drinks  . Drug use: No  . Sexual activity: Yes    Birth control/protection: None  Other Topics Concern  . Not on file  Social History Narrative  . Not on file   Social Determinants of Health   Financial Resource Strain:   . Difficulty of Paying Living Expenses:   Food Insecurity:   . Worried About Charity fundraiser in the Last Year:   . Arboriculturist in the Last Year:   Transportation Needs:   . Film/video editor (Medical):   Marland Kitchen Lack of Transportation (Non-Medical):   Physical Activity:   . Days of Exercise per Week:   . Minutes of Exercise per Session:   Stress:   . Feeling of Stress :   Social Connections:   . Frequency of Communication with Friends and Family:   . Frequency of Social Gatherings with Friends and Family:   . Attends Religious Services:   . Active Member of Clubs or Organizations:   . Attends Archivist Meetings:   Marland Kitchen Marital Status:   Intimate Partner Violence:   . Fear of Current or Ex-Partner:   . Emotionally Abused:   Marland Kitchen Physically Abused:   . Sexually Abused:     FAMILY HISTORY: Family History  Problem Relation Age of Onset  . Depression Sister   . Heart disease Mother   . Diabetes Father   . Breast cancer Paternal Grandmother     REVIEW OF SYSTEMS: Reviewed with the patient as per History of present illness. Psych: Patient denies having dental phobia.  DENTAL HISTORY: CHIEF COMPLAINT: Patient referred by Dr. Isidore Moos for dental consultation.  HPI: Melissa Case is a 59 year old female recently diagnosed with parotid cancer.  Patient is status post right parotidectomy with Dr. Conley Canal at [facility].  Patient with  anticipated postoperative radiation therapy.  Patient is now seen as part of medically necessary preradiation therapy dental protocol examination.  The patient currently denies acute toothaches, swellings, or abscesses.  Patient was last seen by dentist approximately 5 years ago.  Patient had been treatment plan for extraction remaining teeth at that time.  Patient, however, did not follow-up with that dental treatment.  Patient currently denies having partial dentures.  Patient denies having dental phobia.  DENTAL EXAMINATION: GENERAL: The patient is a well-developed, well-nourished female in no acute distress. HEAD AND NECK: Right neck is consistent with previous parotidectomy and neck dissection.  There is no left neck lymphadenopathy. INTRAORAL EXAM: Patient has normal saliva.  The patient has bilateral mandibular lingual tori.  There is no evidence of oral abscess formation.  There  is evidence of soft tissue indentation from the opposing occlusion area numbers 30/31 with areas tooth numbers 1/2 DENTITION: The patient has multiple missing teeth numbers 1, 2, 8, 9, 16, 17, 18, 19, and 32.  There are retained root segments in the area of tooth numbers 3, 4, and 20.  There is evidence of mandibular anterior incisal attrition.  Multiple diastemas are noted. PERIODONTAL: Patient has chronic, advanced periodontal disease with plaque and calculus accumulations, generalized gingival recession, and tooth mobility as per dental charting form.  There is moderate to severe bone loss noted.  Radiographic calculus is noted. DENTAL CARIES/SUBOPTIMAL RESTORATIONS: Multiple dental caries are noted as per dental charting form. ENDODONTIC: The patient currently denies acute pulpitis symptoms.  The patient does have multiple areas of periapical pathology and radiolucency. CROWN AND BRIDGE: There are no chronic bridge restorations. PROSTHODONTIC: Patient denies having partial dentures. OCCLUSION: Patient has a poor  occlusal scheme secondary to multiple missing teeth, multiple retained root segments, deep overbite, and lack replacing the missing teeth with dental prostheses.  RADIOGRAPHIC INTERPRETATION: Orthopantogram was taken and supplemented with a full series of dental radiographs. There are multiple missing teeth.  There are multiple retained root segments.  There are multiple areas of periapical pathology and radiolucency.  Multiple dental caries are noted.  There is moderate to severe bone loss noted.  Radiographic calculus is noted.  There is supra eruption and drifting of the unopposed teeth into the edentulous areas.   ASSESSMENTS: 1.  Parotid cancer-status post RIGHT parotidectomy 2.  Preradiation therapy dental protocol 3.  Chronic apical periodontitis 4.  Dental caries 5.  Retained root segments 6.  Chronic periodontitis with bone loss 7.  Gingival recession 8.  Accretions 9.  Tooth mobility 10.  Mandibular anterior incisal attrition 11.  Bilateral mandibular lingual tori 12.  Multiple missing teeth 13.  Supra eruption and drifting of the unopposed teeth into the edentulous areas 14.  Deep overbite 15. Poor occlusal scheme and malocclusion   PLAN/RECOMMENDATIONS: 1. I discussed the risks, benefits, and complications of various treatment options with the patient in relationship to her medical and dental conditions, anticipated radiation therapy, radiation therapy side effects to include xerostomia, radiation caries, trismus, mucositis, taste changes, gum and changes, and risk for infection and osteoradionecrosis. We discussed various treatment options to include no treatment, total and subtotal extractions with alveoloplasty, pre-prosthetic surgery as indicated, periodontal therapy, dental restorations, root canal therapy, crown and bridge therapy, implant therapy, and replacement of missing teeth as indicated. The patient currently wishes to proceed with extraction of remaining teeth with  alveoloplasty and bilateral mandibular lingual tori reductions in the operating room with general anesthesia.  This has been scheduled for Thursday, 08/28/2019 at 7:30 AM at [facility].  The patient will then follow-up with a dentist of her choice for fabrication of upper and lower complete dentures after adequate healing and approximately 3 months after the last radiotherapy has been completed.  Patient should be able to start radiation therapy approximately 2 weeks after the date of extractions barring any complications.   2. Discussion of findings with medical team and coordination of future medical and dental care as needed.  I spent in excess of  120 minutes during the conduct of this consultation and >50% of this time involved direct face-to-face encounter for counseling and/or coordination of the patient's care.    Lenn Cal, DDS

## 2019-08-25 NOTE — Patient Instructions (Signed)

## 2019-08-25 NOTE — Progress Notes (Signed)
Melissa Case  Your procedure is scheduled on Thursday April 8.  Report to [facility] Main Entrance "A" at 05:30 A.M., and check in at the Admitting office.  Call this number if you have problems the morning of surgery: [phone]  Call [phone] if you have any questions prior to your surgery date Monday-Friday 8am-4pm   Remember: Do not eat or drink after midnight the night before your surgery  Take these medicines the morning of surgery with A SIP OF WATER: PARoxetine (PAXIL) Eye drops- as needed  As of today, STOP taking any Aspirin (unless otherwise instructed by your surgeon), Aleve, Naproxen, Ibuprofen, Motrin, Advil, Goody's, BC's, all herbal medications, fish oil, and all vitamins.    The Morning of Surgery  Do not wear jewelry, make-up or nail polish.  Do not wear lotions, powders, perfumes, or deodorant  Do not shave 48 hours prior to surgery.     Do not bring valuables to the hospital.  [facility] is not responsible for any belongings or valuables.  If you are a smoker, DO NOT Smoke 24 hours prior to surgery  If you wear a CPAP at night please bring your mask the morning of surgery   Remember that you must have someone to transport you home after your surgery, and remain with you for 24 hours if you are discharged the same day.   Please bring cases for contacts, glasses, hearing aids, dentures or bridgework because it cannot be worn into surgery.    Leave your suitcase in the car.  After surgery it may be brought to your room.  For patients admitted to the hospital, discharge time will be determined by your treatment team.  Patients discharged the day of surgery will not be allowed to drive home.    Special instructions:   [place]- Preparing For Surgery  Before surgery, you can play an important role. Because skin is not sterile, your skin needs to be as free of germs as possible. You can reduce the number of germs on your skin by washing with CHG  (chlorahexidine gluconate) Soap before surgery.  CHG is an antiseptic cleaner which kills germs and bonds with the skin to continue killing germs even after washing.    Oral Hygiene is also important to reduce your risk of infection.  Remember - BRUSH YOUR TEETH THE MORNING OF SURGERY WITH YOUR REGULAR TOOTHPASTE  Please do not use if you have an allergy to CHG or antibacterial soaps. If your skin becomes reddened/irritated stop using the CHG.  Do not shave (including legs and underarms) for at least 48 hours prior to first CHG shower. It is OK to shave your face.  Please follow these instructions carefully.   1. Shower the NIGHT BEFORE SURGERY and the MORNING OF SURGERY with CHG Soap.   2. If you chose to wash your hair and body, wash as usual with your normal shampoo and body-wash/soap.  3. Rinse your hair and body thoroughly to remove the shampoo and soap.  4. Apply CHG directly to the skin (ONLY FROM THE NECK DOWN) and wash gently with a scrungie or a clean washcloth.   5. Do not use on open wounds or open sores. Avoid contact with your eyes, ears, mouth and genitals (private parts). Wash Face and genitals (private parts)  with your normal soap.   6. Wash thoroughly, paying special attention to the area where your surgery will be performed.  7. Thoroughly rinse your body with warm water from  the neck down.  8. DO NOT shower/wash with your normal soap after using and rinsing off the CHG Soap.  9. Pat yourself dry with a CLEAN TOWEL.  10. Wear CLEAN PAJAMAS to bed the night before surgery  11. Place CLEAN SHEETS on your bed the night of your first shower and DO NOT SLEEP WITH PETS.  12. Wear comfortable clothes the morning of surgery.     Day of Surgery:  Please shower the morning of surgery with the CHG soap Do not apply any deodorants/lotions. Please wear clean clothes to the hospital/surgery center.   Remember to brush your teeth WITH YOUR REGULAR TOOTHPASTE.   Please  read over the following fact sheets that you were given.

## 2019-08-26 ENCOUNTER — Ambulatory Visit: Payer: No Typology Code available for payment source | Admitting: Nutrition

## 2019-08-26 ENCOUNTER — Encounter (HOSPITAL_COMMUNITY): Payer: Self-pay

## 2019-08-26 ENCOUNTER — Other Ambulatory Visit (HOSPITAL_COMMUNITY)
Admission: RE | Admit: 2019-08-26 | Discharge: 2019-08-26 | Disposition: A | Discharge status: Home / Self Care | Payer: No Typology Code available for payment source | Source: Ambulatory Visit | Attending: Dentistry | Admitting: Dentistry

## 2019-08-26 ENCOUNTER — Encounter (HOSPITAL_COMMUNITY)
Admission: RE | Admit: 2019-08-26 | Discharge: 2019-08-26 | Disposition: A | Discharge status: Home / Self Care | Payer: No Typology Code available for payment source | Source: Ambulatory Visit | Attending: Dentistry | Admitting: Dentistry

## 2019-08-26 ENCOUNTER — Other Ambulatory Visit: Payer: Self-pay

## 2019-08-26 DIAGNOSIS — Z01812 Encounter for preprocedural laboratory examination: Secondary | ICD-10-CM | POA: Diagnosis not present

## 2019-08-26 DIAGNOSIS — Z20822 Contact with and (suspected) exposure to covid-19: Secondary | ICD-10-CM | POA: Diagnosis not present

## 2019-08-26 HISTORY — DX: Panic disorder (episodic paroxysmal anxiety): F41.0

## 2019-08-26 HISTORY — DX: Malignant (primary) neoplasm, unspecified: C80.1

## 2019-08-26 LAB — CBC
HCT: 44.2 % (ref 36.0–46.0)
Hemoglobin: 13.9 g/dL (ref 12.0–15.0)
MCH: 29.2 pg (ref 26.0–34.0)
MCHC: 31.4 g/dL (ref 30.0–36.0)
MCV: 92.9 fL (ref 80.0–100.0)
Platelets: 206 10*3/uL (ref 150–400)
RBC: 4.76 MIL/uL (ref 3.87–5.11)
RDW: 13.5 % (ref 11.5–15.5)
WBC: 5.2 10*3/uL (ref 4.0–10.5)
nRBC: 0 % (ref 0.0–0.2)

## 2019-08-26 LAB — BASIC METABOLIC PANEL
Anion gap: 12 (ref 5–15)
BUN: 13 mg/dL (ref 6–20)
CO2: 24 mmol/L (ref 22–32)
Calcium: 9.3 mg/dL (ref 8.9–10.3)
Chloride: 104 mmol/L (ref 98–111)
Creatinine, Ser: 0.57 mg/dL (ref 0.44–1.00)
GFR calc Af Amer: >60 mL/min (ref >60)
GFR calc non Af Amer: >60 mL/min (ref >60)
Glucose, Bld: 98 mg/dL (ref 70–99)
Potassium: 4.4 mmol/L (ref 3.5–5.1)
Sodium: 140 mmol/L (ref 135–145)

## 2019-08-26 LAB — SARS CORONAVIRUS 2 (TAT 6-24 HRS): SARS Coronavirus 2: NEGATIVE

## 2019-08-26 NOTE — Progress Notes (Signed)
Oncology Nurse Navigator Documentation  Called and spoke with Melissa Case to confirm her upcoming appointments including her dental extractions, PET scan on 09/03/19 at [facility], and CT simulation appointment on 09/05/19 at [facility]. She verified that she was aware of these appointments. She knows to call me with any further questions or concerns.

## 2019-08-26 NOTE — Progress Notes (Signed)
Melissa Case  Your procedure is scheduled on Thursday April 8.  Report to [facility] Main Entrance "A" at 05:30 A.M., and check in at the Admitting office.  Call this number if you have problems the morning of surgery: [phone]  Call [phone] if you have any questions prior to your surgery date Monday-Friday 8am-4pm   Remember: Do not eat or drink after midnight the night before your surgery  Take these medicines the morning of surgery with A SIP OF WATER: PARoxetine (PAXIL) Eye drops- as needed  As of today, STOP taking any Aspirin (unless otherwise instructed by your surgeon), Aleve, Naproxen, Ibuprofen, Motrin, Advil, Goody's, BC's, all herbal medications, fish oil, and all vitamins.  Special instructions:   [place]- Preparing For Surgery  Before surgery, you can play an important role. Because skin is not sterile, your skin needs to be as free of germs as possible. You can reduce the number of germs on your skin by washing with CHG (chlorahexidine gluconate) Soap before surgery.  CHG is an antiseptic cleaner which kills germs and bonds with the skin to continue killing germs even after washing.    Oral Hygiene is also important to reduce your risk of infection.  Remember - BRUSH YOUR TEETH THE MORNING OF SURGERY WITH YOUR REGULAR TOOTHPASTE  Please do not use if you have an allergy to CHG or antibacterial soaps. If your skin becomes reddened/irritated stop using the CHG.  Do not shave (including legs and underarms) for at least 48 hours prior to first CHG shower. It is OK to shave your face.  Please follow these instructions carefully.   1. Shower the NIGHT BEFORE SURGERY and the MORNING OF SURGERY with CHG Soap.   2. If you chose to wash your hair, wash as usual with your normal shampoo, wash your face and private area with the soap you use at home, then rinse your hair and body thoroughly to remove the shampoo and soap. and body-wash/soap.  3. Rinse your hair  and body thoroughly to remove the shampoo and soap.  4. Apply CHG directly to the skin (ONLY FROM THE NECK DOWN) and wash gently with a scrungie or a clean washcloth.   Do not use on open wounds or open sores. Avoid contact with your eyes, ears, mouth and genitals (private parts). Wash  5. Wash thoroughly, paying special attention to the area where your surgery will be performed.  6. Thoroughly rinse your body with warm water from the neck down.  7. DO NOT shower/wash with your normal soap after using and rinsing off the CHG Soap.  8. Pat yourself dry with a CLEAN TOWEL.  9. Wear CLEAN PAJAMAS to bed the night before surgery  10. Place CLEAN SHEETS on your bed the night of your first shower and DO NOT SLEEP WITH PETS.  11. Wear comfortable clothes the morning of surgery.   Day of Surgery: Shower as instructed above. Please shower the morning of surgery with the CHG soap Do not apply any deodorants/lotions. Please wear clean clothes to the hospital/surgery center.   Remember to brush your teeth WITH YOUR REGULAR TOOTHPASTE.  Do not wear lotions, powders, perfumes, or deodorant.  Do not wear jewelry, make-up or nail polish.  Do not shave 48 hours prior to surgery.     Do not bring valuables to the hospital.  [facility] is not responsible for any belongings or valuables.  If you are a smoker, DO NOT Smoke 24 hours prior to  surgery  If you wear a CPAP at night please bring your mask the morning of surgery   Remember that you must have someone to transport you home after your surgery, and remain with you for 24 hours if yo Please read over the following fact sheets that you were given.

## 2019-08-26 NOTE — Progress Notes (Signed)
I contacted patient by telephone as she had a telephone appointment scheduled today at 9 AM.  Patient was in the middle of 2 other appointments that were scheduled.  Patient wishes to defer telephone consult until tomorrow.  I will reschedule her and call her tomorrow morning.

## 2019-08-26 NOTE — Progress Notes (Addendum)
PCP - does not have one  Cardiologist -   Chest x-ray - no  EKG - NA  Stress Test - No  ECHO - no Cardiac Cath - no  Sleep Study - no CPAP - no  LABS-CBC, BMP  ASA-no ERAS-no  HA1C-NA Fasting Blood Sugar - NA Checks Blood Sugar ___0__ times a day  Anesthesia-  Pt denies having chest pain, sob, or fever at this time. All instructions explained to the pt, with a verbal understanding of the material. Pt agrees to go over the instructions while at home for a better understanding. Pt also instructed to self quarantine after being tested for COVID-19. The opportunity to ask questions was provided.

## 2019-08-27 ENCOUNTER — Inpatient Hospital Stay: Payer: PRIVATE HEALTH INSURANCE | Admitting: Nutrition

## 2019-08-27 NOTE — Progress Notes (Signed)
59 year old female diagnosed with parotid cancer status post parotidectomy to receive radiation treatments after total dental extraction. She is a patient of Dr. Isidore Moos.  Past medical history includes depression, GERD, and tobacco.  Medications include Nexium.  Labs were reviewed.  Height: 64 inches. Weight: 164.7 pounds on April 6. Usual body weight: 178 pounds in 2019. BMI: 28.27.  Estimated nutrition needs: 2000-2200 cal, 90-105 g protein, 2.2 L fluid.  Patient reports she will be having all her teeth pulled and was told she will need a soft diet.  She was also told to pick up some oral nutrition supplements.  Patient has lost 7% of her usual body weight however I am unsure of the timeframe.  Nutrition diagnosis: Food and nutrition related knowledge deficit related to new diagnosis of cancer and associated treatments as evidenced by no prior need for nutrition related information.  Intervention: Educated patient on strategies for consuming oral nutrition supplements and very soft or pured foods. If unable to eat food patient would require 6 bottles of Ensure plus daily. Educated patient on strategies for pureing foods. Provided fact sheets, contact information, and 1 complementary case of Ensure Enlive.  Monitoring, evaluation, goals: Patient will tolerate adequate calories and protein to minimize weight loss throughout treatment.  Next visit: To be scheduled with treatments weekly.  **Disclaimer: This note was dictated with voice recognition software. Similar sounding words can inadvertently be transcribed and this note may contain transcription errors which may not have been corrected upon publication of note.**

## 2019-08-28 ENCOUNTER — Encounter (HOSPITAL_COMMUNITY): Payer: Self-pay | Admitting: Dentistry

## 2019-08-28 ENCOUNTER — Other Ambulatory Visit: Payer: Self-pay

## 2019-08-28 ENCOUNTER — Encounter (HOSPITAL_COMMUNITY)
Admission: RE | Disposition: A | Discharge status: Home / Self Care | Payer: Self-pay | Source: Home / Self Care | Attending: Dentistry

## 2019-08-28 ENCOUNTER — Ambulatory Visit (HOSPITAL_COMMUNITY): Payer: No Typology Code available for payment source | Admitting: Anesthesiology

## 2019-08-28 ENCOUNTER — Ambulatory Visit (HOSPITAL_COMMUNITY)
Admission: RE | Admit: 2019-08-28 | Discharge: 2019-08-28 | Disposition: A | Discharge status: Home / Self Care | Payer: No Typology Code available for payment source | Attending: Dentistry | Admitting: Dentistry

## 2019-08-28 DIAGNOSIS — K029 Dental caries, unspecified: Secondary | ICD-10-CM | POA: Diagnosis not present

## 2019-08-28 DIAGNOSIS — F329 Major depressive disorder, single episode, unspecified: Secondary | ICD-10-CM | POA: Diagnosis not present

## 2019-08-28 DIAGNOSIS — M278 Other specified diseases of jaws: Secondary | ICD-10-CM

## 2019-08-28 DIAGNOSIS — K0889 Other specified disorders of teeth and supporting structures: Secondary | ICD-10-CM | POA: Diagnosis not present

## 2019-08-28 DIAGNOSIS — K045 Chronic apical periodontitis: Secondary | ICD-10-CM | POA: Diagnosis not present

## 2019-08-28 DIAGNOSIS — K0530 Chronic periodontitis, unspecified: Secondary | ICD-10-CM

## 2019-08-28 DIAGNOSIS — K083 Retained dental root: Secondary | ICD-10-CM | POA: Insufficient documentation

## 2019-08-28 DIAGNOSIS — Z885 Allergy status to narcotic agent status: Secondary | ICD-10-CM | POA: Insufficient documentation

## 2019-08-28 DIAGNOSIS — K219 Gastro-esophageal reflux disease without esophagitis: Secondary | ICD-10-CM | POA: Diagnosis not present

## 2019-08-28 DIAGNOSIS — Z880 Allergy status to penicillin: Secondary | ICD-10-CM | POA: Insufficient documentation

## 2019-08-28 DIAGNOSIS — Z79899 Other long term (current) drug therapy: Secondary | ICD-10-CM | POA: Insufficient documentation

## 2019-08-28 DIAGNOSIS — M899 Disorder of bone, unspecified: Secondary | ICD-10-CM | POA: Diagnosis not present

## 2019-08-28 DIAGNOSIS — M270 Developmental disorders of jaws: Secondary | ICD-10-CM | POA: Diagnosis not present

## 2019-08-28 DIAGNOSIS — C770 Secondary and unspecified malignant neoplasm of lymph nodes of head, face and neck: Secondary | ICD-10-CM | POA: Diagnosis not present

## 2019-08-28 DIAGNOSIS — C07 Malignant neoplasm of parotid gland: Secondary | ICD-10-CM | POA: Diagnosis not present

## 2019-08-28 DIAGNOSIS — F17210 Nicotine dependence, cigarettes, uncomplicated: Secondary | ICD-10-CM | POA: Diagnosis not present

## 2019-08-28 HISTORY — PX: MULTIPLE EXTRACTIONS WITH ALVEOLOPLASTY: SHX5342

## 2019-08-28 SURGERY — MULTIPLE EXTRACTION WITH ALVEOLOPLASTY
Anesthesia: General | Site: Mouth

## 2019-08-28 MED ORDER — BUPIVACAINE-EPINEPHRINE (PF) 0.5% -1:200000 IJ SOLN
INTRAMUSCULAR | Status: AC
  Filled 2019-08-28: qty 3.6

## 2019-08-28 MED ORDER — CLINDAMYCIN PHOSPHATE 600 MG/50ML IV SOLN
600.0000 mg | Freq: Once | INTRAVENOUS | Status: AC
  Administered 2019-08-28: 600 mg via INTRAVENOUS
  Filled 2019-08-28: qty 50

## 2019-08-28 MED ORDER — MIDAZOLAM HCL 2 MG/2ML IJ SOLN
INTRAMUSCULAR | Status: AC
  Filled 2019-08-28: qty 2

## 2019-08-28 MED ORDER — OXYCODONE-ACETAMINOPHEN 5-325 MG PO TABS: 1.0000 | ORAL_TABLET | ORAL | 0 refills | Status: DC | PRN

## 2019-08-28 MED ORDER — ROCURONIUM BROMIDE 10 MG/ML (PF) SYRINGE
PREFILLED_SYRINGE | INTRAVENOUS | Status: AC
  Filled 2019-08-28: qty 10

## 2019-08-28 MED ORDER — LIDOCAINE 2% (20 MG/ML) 5 ML SYRINGE
INTRAMUSCULAR | Status: DC | PRN
  Administered 2019-08-28: 80 mg via INTRAVENOUS

## 2019-08-28 MED ORDER — LIDOCAINE-EPINEPHRINE 2 %-1:100000 IJ SOLN
INTRAMUSCULAR | Status: DC | PRN
  Administered 2019-08-28: 13.6 mL

## 2019-08-28 MED ORDER — FENTANYL CITRATE (PF) 250 MCG/5ML IJ SOLN
INTRAMUSCULAR | Status: DC | PRN
  Administered 2019-08-28: 100 ug via INTRAVENOUS
  Administered 2019-08-28 (×2): 50 ug via INTRAVENOUS

## 2019-08-28 MED ORDER — PROPOFOL 10 MG/ML IV BOLUS
INTRAVENOUS | Status: DC | PRN
  Administered 2019-08-28: 140 mg via INTRAVENOUS

## 2019-08-28 MED ORDER — LIDOCAINE-EPINEPHRINE 2 %-1:100000 IJ SOLN
INTRAMUSCULAR | Status: AC
  Filled 2019-08-28: qty 3.4

## 2019-08-28 MED ORDER — MIDAZOLAM HCL 5 MG/5ML IJ SOLN
INTRAMUSCULAR | Status: DC | PRN
  Administered 2019-08-28: 2 mg via INTRAVENOUS

## 2019-08-28 MED ORDER — LIDOCAINE-EPINEPHRINE 2 %-1:100000 IJ SOLN
INTRAMUSCULAR | Status: AC
  Filled 2019-08-28: qty 10.2

## 2019-08-28 MED ORDER — FENTANYL CITRATE (PF) 100 MCG/2ML IJ SOLN: 25.0000 ug | INTRAMUSCULAR | Status: DC | PRN

## 2019-08-28 MED ORDER — ONDANSETRON HCL 4 MG/2ML IJ SOLN
INTRAMUSCULAR | Status: DC | PRN
  Administered 2019-08-28: 4 mg via INTRAVENOUS

## 2019-08-28 MED ORDER — PHENYLEPHRINE 40 MCG/ML (10ML) SYRINGE FOR IV PUSH (FOR BLOOD PRESSURE SUPPORT)
PREFILLED_SYRINGE | INTRAVENOUS | Status: AC
  Filled 2019-08-28: qty 10

## 2019-08-28 MED ORDER — PHENYLEPHRINE HCL-NACL 10-0.9 MG/250ML-% IV SOLN
INTRAVENOUS | Status: DC | PRN
  Administered 2019-08-28: 25 ug/min via INTRAVENOUS

## 2019-08-28 MED ORDER — SUGAMMADEX SODIUM 200 MG/2ML IV SOLN
INTRAVENOUS | Status: DC | PRN
  Administered 2019-08-28: 200 mg via INTRAVENOUS

## 2019-08-28 MED ORDER — PROPOFOL 10 MG/ML IV BOLUS
INTRAVENOUS | Status: AC
  Filled 2019-08-28: qty 20

## 2019-08-28 MED ORDER — DEXAMETHASONE SODIUM PHOSPHATE 10 MG/ML IJ SOLN
INTRAMUSCULAR | Status: AC
  Filled 2019-08-28: qty 1

## 2019-08-28 MED ORDER — BUPIVACAINE-EPINEPHRINE 0.5% -1:200000 IJ SOLN
INTRAMUSCULAR | Status: DC | PRN
  Administered 2019-08-28: 3.6 mL

## 2019-08-28 MED ORDER — OXYMETAZOLINE HCL 0.05 % NA SOLN
NASAL | Status: AC
  Filled 2019-08-28: qty 30

## 2019-08-28 MED ORDER — ONDANSETRON HCL 4 MG/2ML IJ SOLN: 4.0000 mg | Freq: Once | INTRAMUSCULAR | Status: DC | PRN

## 2019-08-28 MED ORDER — THROMBIN 5000 UNITS EX SOLR
CUTANEOUS | Status: AC
  Filled 2019-08-28: qty 5000

## 2019-08-28 MED ORDER — ONDANSETRON HCL 4 MG/2ML IJ SOLN
INTRAMUSCULAR | Status: AC
  Filled 2019-08-28: qty 2

## 2019-08-28 MED ORDER — SUCCINYLCHOLINE CHLORIDE 200 MG/10ML IV SOSY
PREFILLED_SYRINGE | INTRAVENOUS | Status: DC | PRN
  Administered 2019-08-28: 140 mg via INTRAVENOUS

## 2019-08-28 MED ORDER — DEXAMETHASONE SODIUM PHOSPHATE 10 MG/ML IJ SOLN
INTRAMUSCULAR | Status: DC | PRN
  Administered 2019-08-28: 10 mg via INTRAVENOUS

## 2019-08-28 MED ORDER — 0.9 % SODIUM CHLORIDE (POUR BTL) OPTIME
TOPICAL | Status: DC | PRN
  Administered 2019-08-28: 1000 mL

## 2019-08-28 MED ORDER — FENTANYL CITRATE (PF) 250 MCG/5ML IJ SOLN
INTRAMUSCULAR | Status: AC
  Filled 2019-08-28: qty 5

## 2019-08-28 MED ORDER — LIDOCAINE 2% (20 MG/ML) 5 ML SYRINGE
INTRAMUSCULAR | Status: AC
  Filled 2019-08-28: qty 5

## 2019-08-28 MED ORDER — OXYMETAZOLINE HCL 0.05 % NA SOLN
NASAL | Status: DC | PRN
  Administered 2019-08-28 (×2): 1 via NASAL

## 2019-08-28 MED ORDER — PHENYLEPHRINE 40 MCG/ML (10ML) SYRINGE FOR IV PUSH (FOR BLOOD PRESSURE SUPPORT)
PREFILLED_SYRINGE | INTRAVENOUS | Status: DC | PRN
  Administered 2019-08-28: 40 ug via INTRAVENOUS
  Administered 2019-08-28: 120 ug via INTRAVENOUS
  Administered 2019-08-28: 80 ug via INTRAVENOUS

## 2019-08-28 MED ORDER — SUCCINYLCHOLINE CHLORIDE 200 MG/10ML IV SOSY
PREFILLED_SYRINGE | INTRAVENOUS | Status: AC
  Filled 2019-08-28: qty 10

## 2019-08-28 MED ORDER — LACTATED RINGERS IV SOLN: INTRAVENOUS | Status: DC | PRN

## 2019-08-28 MED ORDER — ROCURONIUM BROMIDE 10 MG/ML (PF) SYRINGE
PREFILLED_SYRINGE | INTRAVENOUS | Status: DC | PRN
  Administered 2019-08-28: 50 mg via INTRAVENOUS

## 2019-08-28 SURGICAL SUPPLY — 40 items
ALCOHOL 70% 16 OZ (MISCELLANEOUS) ×3 IMPLANT
ATTRACTOMAT 16X20 MAGNETIC DRP (DRAPES) ×3 IMPLANT
BANDAGE HEMOSTAT MRDH 4X4 STRL (MISCELLANEOUS) IMPLANT
BLADE SURG 15 STRL LF DISP TIS (BLADE) ×2 IMPLANT
BLADE SURG 15 STRL SS (BLADE) ×6
BNDG HEMOSTAT MRDH 4X4 STRL (MISCELLANEOUS)
COVER SURGICAL LIGHT HANDLE (MISCELLANEOUS) IMPLANT
COVER WAND RF STERILE (DRAPES) IMPLANT
GAUZE 4X4 16PLY RFD (DISPOSABLE) ×6 IMPLANT
GAUZE PACKING FOLDED 2  STR (GAUZE/BANDAGES/DRESSINGS) ×3
GAUZE PACKING FOLDED 2 STR (GAUZE/BANDAGES/DRESSINGS) ×1 IMPLANT
GLOVE BIO SURGEON STRL SZ 6.5 (GLOVE) ×2 IMPLANT
GLOVE BIO SURGEONS STRL SZ 6.5 (GLOVE) ×1
GLOVE SURG ORTHO 8.0 STRL STRW (GLOVE) ×3 IMPLANT
GOWN STRL REUS W/ TWL LRG LVL3 (GOWN DISPOSABLE) ×1 IMPLANT
GOWN STRL REUS W/TWL 2XL LVL3 (GOWN DISPOSABLE) ×3 IMPLANT
GOWN STRL REUS W/TWL LRG LVL3 (GOWN DISPOSABLE) ×3
HEMOSTAT SURGICEL 2X14 (HEMOSTASIS) IMPLANT
KIT BASIN OR (CUSTOM PROCEDURE TRAY) ×3 IMPLANT
KIT TURNOVER KIT B (KITS) ×3 IMPLANT
MANIFOLD NEPTUNE II (INSTRUMENTS) ×3 IMPLANT
NEEDLE BLUNT 16X1.5 OR ONLY (NEEDLE) ×3 IMPLANT
NEEDLE DENTAL 27 LONG (NEEDLE) ×6 IMPLANT
NS IRRIG 1000ML POUR BTL (IV SOLUTION) ×3 IMPLANT
PACK EENT II TURBAN DRAPE (CUSTOM PROCEDURE TRAY) ×3 IMPLANT
PAD ARMBOARD 7.5X6 YLW CONV (MISCELLANEOUS) ×9 IMPLANT
SPONGE SURGIFOAM ABS GEL 100 (HEMOSTASIS) IMPLANT
SPONGE SURGIFOAM ABS GEL 12-7 (HEMOSTASIS) IMPLANT
SPONGE SURGIFOAM ABS GEL SZ50 (HEMOSTASIS) IMPLANT
SUCTION FRAZIER HANDLE 10FR (MISCELLANEOUS) ×3
SUCTION TUBE FRAZIER 10FR DISP (MISCELLANEOUS) ×1 IMPLANT
SUT CHROMIC 3 0 PS 2 (SUTURE) ×12 IMPLANT
SUT CHROMIC 4 0 P 3 18 (SUTURE) IMPLANT
SYR 50ML SLIP (SYRINGE) ×3 IMPLANT
TOWEL GREEN STERILE (TOWEL DISPOSABLE) ×3 IMPLANT
TUBE CONNECTING 12'X1/4 (SUCTIONS) ×1
TUBE CONNECTING 12X1/4 (SUCTIONS) ×2 IMPLANT
WATER STERILE IRR 1000ML POUR (IV SOLUTION) ×3 IMPLANT
WATER TABLETS ICX (MISCELLANEOUS) ×3 IMPLANT
YANKAUER SUCT BULB TIP NO VENT (SUCTIONS) ×3 IMPLANT

## 2019-08-28 NOTE — Anesthesia Postprocedure Evaluation (Signed)
Anesthesia Post Note  Patient: Rashida Devon  Procedure(s) Performed: Extraction of tooth #'s 3-7, 10-15, and 20-31 with alveoloplatsy, maxillary right buccal exostosis reduction, and bilateral mandibular tori reductions. (N/A Mouth)     Patient location during evaluation: PACU Anesthesia Type: General Level of consciousness: awake and alert Pain management: pain level controlled Vital Signs Assessment: post-procedure vital signs reviewed and stable Respiratory status: spontaneous breathing, nonlabored ventilation, respiratory function stable and patient connected to nasal cannula oxygen Cardiovascular status: blood pressure returned to baseline and stable Postop Assessment: no apparent nausea or vomiting Anesthetic complications: no    Last Vitals:  Vitals:   08/28/19 1045 08/28/19 1056  BP: 126/60 132/87  Pulse: 72 75  Resp: (!) 24 (!) 26  Temp:  (!) 36.4 C  SpO2: 94% 95%    Last Pain:  Vitals:   08/28/19 1056  TempSrc:   PainSc: 0-No pain                 Effie Berkshire

## 2019-08-28 NOTE — Discharge Instructions (Signed)
Patient:            Melissa Case Date of Birth:  Apr 17, 1961 MRN:                [id number]  MOUTH CARE AFTER SURGERY  FACTS:  Ice used in ice bag helps keep the swelling down, and can help lessen the pain.  It is easier to treat pain BEFORE it happens.  Spitting disturbs the clot and may cause bleeding to start again, or to get worse.  Smoking delays healing and can cause complications.  Sharing prescriptions can be dangerous.  Do not take medications not recently prescribed for you.  Antibiotics may stop birth control pills from working.  Use other means of birth control while on antibiotics.  Warm salt water rinses after the first 24 hours will help lessen the swelling:  Use 1/2 teaspoonful of table salt per oz.of water.  DO NOT:  Do not spit.  Do not drink through a straw.  Strongly advised not to smoke, dip snuff or chew tobacco at least for 3 days.  Do not eat sharp or crunchy foods.  Avoid the area of surgery when chewing.  Do not stop your antibiotics before your instructions say to do so.  Do not eat hot foods until bleeding has stopped.  If you need to, let your food cool down to room temperature.  EXPECT:  Some swelling, especially first 2-3 days.  Soreness or discomfort in varying degrees.  Follow your dentist's instructions about how to handle pain before it starts.  Pinkish saliva or light blood in saliva, or on your pillow in the morning.  This can last around 24 hours.  Bruising inside or outside the mouth.  This may not show up until 2-3 days after surgery.  Don't worry, it will go away in time.  Pieces of "bone" may work themselves loose.  It's OK.  If they bother you, let us know.  WHAT TO DO IMMEDIATELY AFTER SURGERY:  Bite on the gauze with steady pressure for 1-2 hours.  Don't chew on the gauze.  Do not lie down flat.  Raise your head support especially for the first 24 hours.  Apply ice to your face on the side of the surgery.  You may apply  it 20 minutes on and a few minutes off.  Ice for 8-12 hours.  You may use ice up to 24 hours.  Before the numbness wears off, take a pain pill as instructed.  Prescription pain medication is not always required.  SWELLING:  Expect swelling for the first couple of days.  It should get better after that.  If swelling increases 3 days or so after surgery; let us know as soon as possible.  FEVER:  Take Tylenol every 4 hours if needed to lower your temperature, especially if it is at 100F or higher.  Drink lots of fluids.  If the fever does not go away, let us know.  BREATHING TROUBLE:  Any unusual difficulty breathing means you have to have someone bring you to the emergency room ASAP  BLEEDING:  Light oozing is expected for 24 hours or so.  Prop head up with pillows  Avoid spitting  Do not confuse bright red fresh flowing blood with lots of saliva colored with a little bit of blood.  If you notice some bleeding, place gauze or a tea bag where it is bleeding and apply CONSTANT pressure by biting down for 1 hour.  Avoid talking during this  time.  Do not remove the gauze or tea bag during this hour to "check" the bleeding.  If you notice bright RED bleeding FLOWING out of particular area, and filling the floor of your mouth, put a wad of gauze on that area, bite down firmly and constantly.  Call us immediately.  If we're closed, have someone bring you to the emergency room.  ORAL HYGIENE:  Brush your teeth as usual after meals and before bedtime.  Use a soft toothbrush around the area of surgery.  DO NOT AVOID BRUSHING.  Otherwise bacteria(germs) will grow and may delay healing or encourage infection.  Since you cannot spit, just gently rinse and let the water flow out of your mouth.  DO NOT SWISH HARD.  EATING:  Cool liquids are a good point to start.  Increase to soft foods as tolerated.  PRESCRIPTIONS:  Follow the directions for your prescriptions exactly as  written.  If Dr. Enrique Sack gave you a narcotic pain medication, do not drive, operate machinery or drink alcohol when on that medication.  QUESTIONS:  Call our office during office hours [phone] or call the Emergency Room at [phone].

## 2019-08-28 NOTE — Op Note (Signed)
OPERATIVE REPORT  Patient:            Melissa Case Date of Birth:  1961/05/09 MRN:                [id number]   DATE OF PROCEDURE:  08/28/2019  PREOPERATIVE DIAGNOSES: 1.  Parotid cancer 2.  Preradiation therapy dental protocol 3.  Chronic apical periodontitis 4.  Dental caries 5.  Retained root segments 6.  Chronic periodontitis 7.  Loose teeth 8.  Bilateral mandibular lingual tori 9.  Maxillary right buccal exostosis  POSTOPERATIVE DIAGNOSES: 1.  Parotid cancer 2.  Preradiation therapy dental protocol 3.  Chronic apical periodontitis 4.  Dental caries 5.  Retained root segments 6.  Chronic periodontitis 7.  Loose teeth 8.  Bilateral mandibular lingual tori 9.  Maxillary right buccal exostosis  OPERATIONS: 1. Multiple extraction of tooth numbers 3 -7, 10 -15, and 20 - 31. 2. 4 Quadrants of alveoloplasty 3.  Bilateral mandibular lingual tori reductions 4.  Maxillary right buccal exostosis reduction   SURGEON: Lenn Cal, DDS  ASSISTANT: Molli Posey (dental assistant)  ANESTHESIA: General anesthesia via nasoendotracheal tube.  MEDICATIONS: 1.  Clindamycin 600 mg IV prior to invasive dental procedures. 2. Local anesthesia with a total utilization of 7 carpules each containing 34 mg of lidocaine with 0.017 mg of epinephrine as well as 2 carpules each containing 9 mg of bupivacaine with 0.009 mg of epinephrine.  SPECIMENS: There are 23 teeth that were discarded.  DRAINS: None  CULTURES: None  COMPLICATIONS: None  ESTIMATED BLOOD LOSS: 100 mLs.  INTRAVENOUS FLUIDS: 1000 mLs of Lactated ringers solution.  INDICATIONS: The patient was recently diagnosed with right parotid cancer.  Patient underwent a parotidectomy and neck dissection with Dr. Conley Canal at [facility].  Patient with anticipated radiation therapy with Dr. Isidore Moos.  A medically necessary preradiation therapy dental consultation was then requested to evaluate poor dentition.  The  patient was examined and treatment planned for extraction of remaining teeth with alveoloplasty and preprosthetic surgery as needed in the operating room and general anesthesia.  This treatment plan was formulated to decrease the risks and complications associated with dental infection from affecting the patient's systemic health and to prevent future complications such as osteoradionecrosis.  OPERATIVE FINDINGS: Patient was examined in the operating room number 6.  The teeth were identified for extraction. The patient was noted be affected by chronic apical periodontitis, dental caries, retained root segments, chronic periodontitis, loose teeth, bilateral mandibular lingual tori, and maxillary right buccal exostosis.   DESCRIPTION OF PROCEDURE: Patient was brought to the main operating room number 6. Patient was then placed in the supine position on the operating table. General anesthesia was then induced per the anesthesia team. The patient was then prepped and draped in the usual manner for a dental medicine procedure. A timeout was performed. The patient was identified and procedures were verified. A throat pack was placed at this time. The oral cavity was then thoroughly examined with the findings noted above. The patient was then ready for dental medicine procedure as follows:  Local anesthesia was then administered sequentially with a total utilization of 7 carpules each containing 34 mg of lidocaine with 0.017 mg of epinephrine as well as 2 carpules  each containing 9 mg bupivacaine with 0.009 mg of epinephrine.  The Maxillary left and right quadrants first approached. Anesthesia was then delivered utilizing infiltration with lidocaine with epinephrine. A #15 blade incision was then made from the maxillary left tuberosity and  extended to the maxillary right tuberosity.  A  surgical flap was then carefully reflected. The maxillary teeth were then subluxated with a series straight elevators.  Tooth  numbers 3, 4, 5, 6, 7, 10, 11, 12, 13, 14, 15 were then removed with a 150 forceps without complications.  At this point time, the buccal exostoses in the area of tooth numbers 1 through 3 are visualized and reduced utilizing a rongeurs and bone file.  Alveoloplasty was then performed to the upper right and upper left quadrants with a rongeurs and bone file to help achieve primary closure.  The tissues were approximated and trimmed appropriately.  The surgical sites were irrigated with copious amounts of sterile saline x4 .  The maxillary right surgical site was then closed from the maxillary tuberosity extend the mesial #8 utilizing 3-0 Chromic Gut suture in a continuous erupted suture technique x1.  The maxillary left surgical site was then closed from the maxillary left tuberosity and extended to the mesial of #9 utilizing 3-0 Chromic Gut suture in a continuous erupted suture technique x1.  1 additional interrupted suture was then placed to further close the surgical site with 3-0 Chromic Gut material.  At this point time, the mandibular quadrants were approached. The patient was given bilateral inferior alveolar nerve blocks and long buccal nerve blocks utilizing the bupivacaine with epinephrine. Further infiltration was then achieved utilizing the lidocaine with epinephrine. A 15 blade incision was then made from the distal of number 18 and extended to the distal of #32..  A surgical flap was then carefully reflected.  The lower teeth were then subluxated with a series of straight elevators.  Tooth numbers 20-31 were then removed with a 151 forceps without complications.  The surgical flaps were then reflected on the lingual aspect to expose the bilateral mandibular lingual tori.  These tori were then reduced utilizing a surgical handpiece and bur and copious amounts of sterile water.  Alveoloplasty was then performed on the lower left and lower right quadrants to help achieve primary closure with a rongeurs  and bone file.  The tissues were approximated and trimmed appropriately.  The surgical sites were irrigated with copious amounts sterile saline x4.  The mandibular left surgical site was then closed from the distal of #18 and extended the mesial #24 utilizing 3-0 Chromic Gut suture in a continuous erupted suture technique x1.  The mandibular right surgical site was then closed from the distal #32 and extended to the mesial of #25 utilizing 3-0 Chromic Gut suture in a continuous erupted suture technique x1.  1 interrupted suture was then placed to further close surgical site with 3-0 Chromic Gut material.    At this point time, the entire mouth was irrigated with copious amounts of sterile saline. The patient was examined for complications, seeing none, the dental medicine procedure was deemed to be complete. The throat pack was removed at this time. An oral airway was then placed at the request of the anesthesia team. A series of 4 x 4 gauze were placed in the mouth to aid hemostasis. The patient was then handed over to the anesthesia team for final disposition. After an appropriate amount of time, the patient was extubated and taken to the postanesthsia care unit in good condition. All counts were correct for the dental medicine procedure.   Lenn Cal, DDS.

## 2019-08-28 NOTE — Progress Notes (Signed)
PRE-OPERATIVE NOTE:  08/28/2019 Melissa Case [id number]  VITALS: BP 118/63   Pulse 68   Temp 97.7 F (36.5 C) (Oral)   Resp 18   Ht 5\' 4"  (1.626 m)   Wt 75 kg   SpO2 97%   BMI 28.38 kg/m   Lab Results  Component Value Date   WBC 5.2 08/26/2019   HGB 13.9 08/26/2019   HCT 44.2 08/26/2019   MCV 92.9 08/26/2019   PLT 206 08/26/2019   BMET    Component Value Date/Time   NA 140 08/26/2019 0830   NA 140 07/23/2017 0820   K 4.4 08/26/2019 0830   CL 104 08/26/2019 0830   CO2 24 08/26/2019 0830   GLUCOSE 98 08/26/2019 0830   BUN 13 08/26/2019 0830   BUN 11 07/23/2017 0820   CREATININE 0.57 08/26/2019 0830   CALCIUM 9.3 08/26/2019 0830   GFRNONAA >60 08/26/2019 0830   GFRAA >60 08/26/2019 0830    No results found for: INR, PROTIME No results found for: PTT   Melissa Case presents for dental procedures in the operating room.   SUBJECTIVE: The patient denies any acute medical or dental changes and agrees to proceed with treatment as planned.  EXAM: No sign of acute dental changes.  ASSESSMENT: Patient is affected by chronic apical periodontitis, dental caries, retained root segments, chronic periodontitis, loose teeth and bilateral mandibular lingual tori.  PLAN: Patient agrees to proceed with treatment as planned in the operating room as previously discussed and accepts the risks, benefits, and complications of the proposed treatment. Patient is aware of the risk for bleeding, bruising, swelling, infection, pain, nerve damage, soft tissue damage, sinus involvement, root tip fracture, mandible fracture, and the risks of complications associated with the anesthesia. Patient also is aware of the potential for other complications not mentioned above.   Lenn Cal, DDS

## 2019-08-28 NOTE — H&P (Signed)
08/28/2019  Patient:            Melissa Case Date of Birth:  Nov 06, 1960 MRN:                [id number]   BP 118/63   Pulse 68   Temp 97.7 F (36.5 C) (Oral)   Resp 18   Ht 5\' 4"  (1.626 m)   Wt 75 kg   SpO2 97%   BMI 28.38 kg/m    Melissa Case is a 59 year old female history of right parotid cancer and is status post right parotidectomy and neck dissection.  Patient with anticipated postoperative radiation therapy.  Patient was seen as part of a medically necessary preradiation therapy dental protocol examination. Patient is now scheduled for extraction of remaining teeth with alveoloplasty and bilateral mandibular lingual tori reductions.  Please see note from Dr. Isidore Moos dated 08/20/2019 to act as H&P for the dental operating room procedure.  Lenn Cal, DDS     Initial outpatient Consultation by telephone.  The patient opted for telemedicine to maximize safety during the pandemic.  MyChart video was not obtainable.    Name: Melissa Case           MRN: [id number]          Date: 08/20/2019                      DOB: 07-20-1960  [id number], No Pcp Per  Fredricka Bonine, *   REFERRING PHYSICIAN: Fredricka Bonine, *  DIAGNOSIS: C07    ICD-10-CM   1.   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   Cancer of parotid gland [facility])    C07   Ambulatory referral to Social Work   Amb Referral to Nutrition and Diabetic E   Ambulatory referral to Dentistry          Cancer Staging  Cancer of parotid gland [facility])  Staging form: Major Salivary Glands, AJCC 8th Edition  - Pathologic stage from 08/20/2019: pT3, pN2b - Signed by Eppie Gibson, MD on 08/20/2019        CHIEF COMPLAINT: Here to discuss management of salivary duct cancer     HISTORY OF PRESENT ILLNESS::Melissa Case is a 59 y.o. female who presented with right ptosis, tearing/ mouth droop/right facial nerve weakness in 02/2019. She was treated for Bell's palsy  with no improvement, so she proceeded to brain MRI. Performed on 06/25/2019, this showed: worrisome asymmetry of right parotid gland; asymmetric abnormal enhancement at mastoid segment of facial nerve; adenopathy in right jugulodigastric station.     She first saw Dr. Lucia Gaskins of ENT and subsequently, the patient saw Dr. Conley Canal who performed right parotidectomy and lymphadenectomy on 07/22/2019. Pathology from the procedure revealed: high grade salivary duct carcinoma, of the parotid gland, 1.2 cm; resection margins negative; stylomastoid foramen biopsy positive for carcinoma; both right level 2A lymph nodes positive for metastatic carcinoma, all others negative (2/35); stage pT3, pN2b.     She last saw Dr. Conley Canal for follow up on 08/13/2019.    Tobacco history, if any: current smoker     ETOH abuse, if any: none     Prior cancers, if any: none     Biopsies revealed:   07/22/2019  FINAL PATHOLOGIC DIAGNOSIS    MICROSCOPIC EXAMINATION AND DIAGNOSIS    A.  "RIGHT LEVEL 2A FROZEN", LYMPHADENECTOMY:         Metastatic carcinoma involving two of two  lymph nodes (2/2).         - The largest focus of metastasis: 2.2 cm         - No extranodal extension identified.        B.  "RIGHT LEVEL 2B LYMPH NODE", LYMPHADENECTOMY:         Thirteen benign lymph nodes (0/13).         Benign salivary gland tissue.        C.  "RIGHT LEVEL 2A AND 3 LYMPH NODE", LYMPHADENECTOMY:         Twenty benign lymph nodes (0/20).        D.  "STYLOMASTOID FORAMEN #1", BIOPSY:         Positive for carcinoma.        E.  "FACIAL NERVE AT STYLOMASTOID FORAMEN", BIOPSY:         No evidence of carcinoma.        F.  "STYLOMASTOID FORAMEN #2", BIOPSY:         No evidence of carcinoma (confirmed by CK7, cyclinD1 and    GATA3 immunostains).        G.  "RIGHT TOTAL PAROTIDECTOMY":         High grade salivary gland carcinoma, of the parotid gland.         Greatest dimension 1.2 cm.          All resection margins are negative for tumor.         No definite lymphovascular or perineural invasion    identified.         Background salivary gland show multiple benign nodules with    sclerosing adenosis-like growth pattern.            Nutrition Status   Yes   No   Comments    Weight changes?   ? Swallowing concerns?      Had difficulty eat during early diagnosis, but has since improved - eating all types of foods    PEG?   ? Referrals   Yes   No   Comments    Social Work?   ?    ?         Dentistry?   ?    ?         Swallowing therapy?   ?    ?         Nutrition?   ?    ?         Med/Onc?   ?    ?             Safety Issues   Yes   No   Comments    Prior radiation?   ?    ?         Pacemaker/ICD?   ?    ?         Possible current pregnancy?   ?    ?         Is the patient on methotrexate?   ?    ?            Tobacco/Marijuana/Snuff/ETOH use: Patient smokes about half a pack of cigarettes daily for the past 45 years. Does not use alcohol or illicit drugs.     Current Complaints / other details:    Scheduled for PET scan on 08/29/19 at [facility]. Patient is still dealing with nerve pain along the right side of her face and throbbing to the right ear. Per patient Dr. Conley Canal  expects these issues to resolve once affected nerve recovers.    She has a gold weight in her eye lid that has helped a lot per patient.  She continues to have right facial droop . MBSS tomorrow.     She still has her teeth.  She has not received her Covid vaccine yet.  She is an Print production planner.  Her husband is as well.        PREVIOUS RADIATION THERAPY: No     PAST MEDICAL HISTORY:  has a past medical history of Depression and GERD (gastroesophageal reflux disease).       PAST SURGICAL HISTORY:        Past Surgical History:    Procedure   Laterality   Date    .    ABDOMINAL HYSTERECTOMY            .   CHOLECYSTECTOMY   N/A   11/21/2017        Procedure: LAPAROSCOPIC CHOLECYSTECTOMY WITH INTRAOPERATIVE CHOLANGIOGRAM;  Surgeon: Jovita Kussmaul, MD;  Location: [facility];  Service: General;  Laterality: N/A;    .   PAROTIDECTOMY   Right   07/22/2019        with biopsy; done at [facility] by Dr. Fredricka Bonine          FAMILY HISTORY: family history includes Breast cancer in her paternal grandmother; Depression in her sister; Diabetes in her father; Heart disease in her mother.     SOCIAL HISTORY:  reports that she has been smoking cigarettes. She started smoking about 43 years ago. She has a 22.50 pack-year smoking history. She has never used smokeless tobacco. She reports that she does not drink alcohol or use drugs.     ALLERGIES: Morphine and Penicillins     MEDICATIONS:          Current Outpatient Medications    Medication   Sig   Dispense   Refill    .   carboxymethylcellulose (REFRESH PLUS) 0.5 % SOLN   Place 1 drop into the right eye as needed.            Marland Kitchen   esomeprazole (NEXIUM) 40 MG capsule   40 mg at bedtime.            Marland Kitchen   PARoxetine (PAXIL) 40 MG tablet   TAKE 1 TABLET BY MOUTH (40MG  TOTAL) EVERY MORNING   90 tablet   0    .   traZODone (DESYREL) 50 MG tablet   Take 1/2 to one tab as needed for insomnia.   20 tablet   0        No current facility-administered medications for this encounter.          REVIEW OF SYSTEMS:  Notable for that above.     PHYSICAL EXAM:  vitals were not taken for this visit.    General: Alert and oriented, in no acute distress      LABORATORY DATA:   Recent Labs  CMP      Labs (Brief)                                                                                                                                                                                                                                                                                                                   Recent Labs                                             RADIOGRAPHY: I have personally reviewed her MRI        IMPRESSION/PLAN:     This is a delightful patient with 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 I recommend radiotherapy for this patient.  She understands her radiotherapy would be given 5 days a week for about 6 to 7 weeks.     We discussed the potential risks, benefits, and side effects of radiotherapy. We talked in detail about acute and late effects. We discussed that some of the most bothersome acute effects may be mucositis, dysgeusia, salivary changes, skin irritation, hair loss, nutritional issues. fatigue. We talked about late effects which include but are not necessarily limited to dysphagia, soft tissue injury, hearing loss, xerostomia.  She understands that we will treat to the base of skull and so relatively small portion of her brain will be exposed to radiation.  No guarantees of treatment were given The patient is enthusiastic about proceeding with treatment. I look forward to participating in the patient's care.       PET scan is pending next week.     Simulation (treatment planning) will take place soon after that.     We also discussed that the treatment of head and neck cancer is a multidisciplinary process to maximize treatment outcomes and quality of life. For this reason the following referrals have been or will be made:  Dentistry for dental evaluation, advice on  reducing risk of cavities, osteoradionecrosis, or other oral issues.     Nutritionist for nutrition support during and after treatment.     Social work for social support.      Audiology test.     Today, I offered to schedule the patient and her husband for Covid vaccines.  They accepted this offer.  They have been scheduled to receive their vaccines through [facility] on April 2 at the [facility].     This encounter was provided by telemedicine platform by telephone.  The patient opted for telemedicine to maximize safety during the pandemic.  MyChart video was not obtainable.  The patient has given verbal consent for this type of encounter and has been advised to only accept a meeting of this type in a secure network environment.  On date of service, in total, I spent 70 minutes on this encounter.  The attendants for this meeting include Eppie Gibson  and [name].   During the encounter, Eppie Gibson was located at [facility] Radiation Oncology Department.   Melissa Case was located at home.         __________________________________________     [employer], MD        This document serves as a record of services personally performed by Eppie Gibson, MD. It was created on her behalf by Wilburn Mylar, a trained medical scribe. The creation of this record is based on the scribe's personal observations and the provider's statements to them. This document has been checked and approved by the attending provider.               Electronically signed by Eppie Gibson, MD at 08/20/2019 11:16 AM

## 2019-08-28 NOTE — Anesthesia Procedure Notes (Signed)
Procedure Name: Intubation Date/Time: 08/28/2019 7:46 AM Performed by: Harden Mo, CRNA Pre-anesthesia Checklist: Patient identified, Emergency Drugs available, Suction available and Patient being monitored Patient Re-evaluated:Patient Re-evaluated prior to induction Oxygen Delivery Method: Circle System Utilized Preoxygenation: Pre-oxygenation with 100% oxygen Induction Type: IV induction Ventilation: Mask ventilation without difficulty Laryngoscope Size: Miller and 2 Grade View: Grade I Nasal Tubes: Nasal prep performed, Nasal Rae, Magill forceps- large, utilized and Right Tube size: 7.0 mm Number of attempts: 1 Airway Equipment and Method: Stylet and Oral airway Placement Confirmation: ETT inserted through vocal cords under direct vision,  positive ETCO2 and breath sounds checked- equal and bilateral Tube secured with: Tape Dental Injury: Teeth and Oropharynx as per pre-operative assessment

## 2019-08-28 NOTE — Anesthesia Preprocedure Evaluation (Addendum)
Anesthesia Evaluation  Patient identified by MRN, date of birth, ID band Patient awake    Reviewed: Allergy & Precautions, NPO status , Patient's Chart, lab work & pertinent test results  Airway Mallampati: II  TM Distance: >3 FB Neck ROM: Limited   Comment: R. Facial nerve palsy Dental  (+) Dental Advisory Given, Poor Dentition, Missing, Chipped, Loose   Pulmonary Current Smoker and Patient abstained from smoking.,    breath sounds clear to auscultation       Cardiovascular negative cardio ROS   Rhythm:Regular Rate:Normal     Neuro/Psych PSYCHIATRIC DISORDERS Anxiety Depression    GI/Hepatic Neg liver ROS, GERD  Medicated,  Endo/Other  negative endocrine ROS  Renal/GU negative Renal ROS     Musculoskeletal negative musculoskeletal ROS (+)   Abdominal Normal abdominal exam  (+)   Peds  Hematology negative hematology ROS (+)   Anesthesia Other Findings   Reproductive/Obstetrics                          Anesthesia Physical Anesthesia Plan  ASA: III  Anesthesia Plan: General   Post-op Pain Management:    Induction: Intravenous  PONV Risk Score and Plan: Ondansetron and Dexamethasone  Airway Management Planned: Nasal ETT  Additional Equipment: None  Intra-op Plan:   Post-operative Plan: Extubation in OR  Informed Consent: I have reviewed the patients History and Physical, chart, labs and discussed the procedure including the risks, benefits and alternatives for the proposed anesthesia with the patient or authorized representative who has indicated his/her understanding and acceptance.     Dental advisory given  Plan Discussed with: Anesthesiologist and CRNA  Anesthesia Plan Comments:       Anesthesia Quick Evaluation

## 2019-08-28 NOTE — Transfer of Care (Signed)
Immediate Anesthesia Transfer of Care Note  Patient: Melissa Case  Procedure(s) Performed: Extraction of tooth #'s 3-7, 10-15, and 20-31 with alveoloplatsy, maxillary right buccal exostosis reduction, and bilateral mandibular tori reductions. (N/A Mouth)  Patient Location: PACU  Anesthesia Type:General  Level of Consciousness: awake and drowsy  Airway & Oxygen Therapy: Patient Spontanous Breathing and Patient connected to face mask oxygen  Post-op Assessment: Report given to RN, Post -op Vital signs reviewed and stable and Patient moving all extremities X 4  Post vital signs: Reviewed and stable  Last Vitals:  Vitals Value Taken Time  BP 151/72 08/28/19 1001  Temp    Pulse 87 08/28/19 1002  Resp 17 08/28/19 1002  SpO2 100 % 08/28/19 1002  Vitals shown include unvalidated device data.  Last Pain:  Vitals:   08/28/19 0601  TempSrc:   PainSc: 0-No pain      Patients Stated Pain Goal: 2 ([id number])  Complications: No apparent anesthesia complications

## 2019-09-02 NOTE — Progress Notes (Signed)
Oncology Nurse Navigator Documentation  I received a phone call from Melissa Case today. She reported that she was unable to complete her scheduled audiology exam, because she discovered her insurance was out of network when she arrived for her appointment. She asked me to have her rescheduled somewhere her insurance was excepted. I called several audiology services her in [place] and was able to get her an appointment at [facility] Friday 09/05/19 at 8:30. I called Melissa Case back and she is pleased with this appointment time. I provided her with with the address and phone number of the location. She knows to call me if she have further questions/concerns.  Harlow Asa RN, BSN, OCN Head & Neck Oncology Nurse [place] at [facility] Phone # [phone]  Fax # [phone]

## 2019-09-03 ENCOUNTER — Ambulatory Visit
Admission: RE | Admit: 2019-09-03 | Discharge: 2019-09-03 | Disposition: A | Discharge status: Home / Self Care | Payer: Self-pay | Source: Ambulatory Visit | Attending: Radiation Oncology | Admitting: Radiation Oncology

## 2019-09-03 ENCOUNTER — Ambulatory Visit: Payer: PRIVATE HEALTH INSURANCE | Admitting: Radiation Oncology

## 2019-09-03 ENCOUNTER — Other Ambulatory Visit: Payer: Self-pay

## 2019-09-03 DIAGNOSIS — C07 Malignant neoplasm of parotid gland: Secondary | ICD-10-CM

## 2019-09-03 NOTE — Progress Notes (Signed)
Oncology Nurse Navigator Documentation  Fax sent to [facility] Radiology Imaging Library requesting the following imaging be pushed to Power Share: .  09/03/19 PET Notification of successful fax transmission received.  Order placed for assignment to [place].  Harlow Asa RN, BSN, OCN Head & Neck Oncology Nurse [place] at [facility] Phone # [phone]  Fax # [phone]

## 2019-09-05 ENCOUNTER — Other Ambulatory Visit: Payer: Self-pay

## 2019-09-05 ENCOUNTER — Ambulatory Visit
Admission: RE | Admit: 2019-09-05 | Discharge: 2019-09-05 | Disposition: A | Discharge status: Home / Self Care | Payer: PRIVATE HEALTH INSURANCE | Source: Ambulatory Visit | Attending: Radiation Oncology | Admitting: Radiation Oncology

## 2019-09-05 DIAGNOSIS — C07 Malignant neoplasm of parotid gland: Secondary | ICD-10-CM | POA: Diagnosis present

## 2019-09-05 DIAGNOSIS — Z510 Encounter for antineoplastic radiation therapy: Secondary | ICD-10-CM | POA: Diagnosis not present

## 2019-09-05 NOTE — Progress Notes (Signed)
Oncology Nurse Navigator Documentation  To provide support, encouragement and care continuity, met with Melissa Case during her CT SIM. She was unaccompanied d/t [place] safety practices.   She tolerated procedure without difficulty, denied questions/concerns.   I showed her the radiation treatment area, explained procedures for lobby registration, arrival to Radiation Waiting, arrival to tmt area and preparation for tmt. She voiced understanding.   I encouraged her to call me prior to [facility] with any questions or concerns.   Harlow Asa RN, BSN, OCN Head & Neck Oncology Nurse [place] at [facility] Phone # [phone]  Fax # [phone]

## 2019-09-08 ENCOUNTER — Ambulatory Visit: Payer: PRIVATE HEALTH INSURANCE | Admitting: Radiation Oncology

## 2019-09-08 ENCOUNTER — Ambulatory Visit (HOSPITAL_COMMUNITY): Payer: Dental | Admitting: Dentistry

## 2019-09-08 ENCOUNTER — Other Ambulatory Visit: Payer: Self-pay

## 2019-09-08 ENCOUNTER — Encounter (HOSPITAL_COMMUNITY): Payer: Self-pay | Admitting: Dentistry

## 2019-09-08 VITALS — BP 129/67 | HR 79 | Temp 98.3°F

## 2019-09-08 DIAGNOSIS — K0820 Unspecified atrophy of edentulous alveolar ridge: Secondary | ICD-10-CM

## 2019-09-08 DIAGNOSIS — K08109 Complete loss of teeth, unspecified cause, unspecified class: Secondary | ICD-10-CM

## 2019-09-08 DIAGNOSIS — C07 Malignant neoplasm of parotid gland: Secondary | ICD-10-CM

## 2019-09-08 DIAGNOSIS — K08199 Complete loss of teeth due to other specified cause, unspecified class: Secondary | ICD-10-CM

## 2019-09-08 DIAGNOSIS — Z01818 Encounter for other preprocedural examination: Secondary | ICD-10-CM

## 2019-09-08 NOTE — Progress Notes (Signed)
POST OPERATIVE NOTE:  09/08/2019 Makenzi Scotland [id number]  COVID 19 SCREENING: The patient does not symptoms concerning for COVID-19 infection (Including fever, chills, cough, or new SHORTNESS OF BREATH).    VITALS: BP 129/67 (BP Location: Left Arm)   Pulse 79   Temp 98.3 F (36.8 C)   LABS:  Lab Results  Component Value Date   WBC 5.2 08/26/2019   HGB 13.9 08/26/2019   HCT 44.2 08/26/2019   MCV 92.9 08/26/2019   PLT 206 08/26/2019   BMET    Component Value Date/Time   NA 140 08/26/2019 0830   NA 140 07/23/2017 0820   K 4.4 08/26/2019 0830   CL 104 08/26/2019 0830   CO2 24 08/26/2019 0830   GLUCOSE 98 08/26/2019 0830   BUN 13 08/26/2019 0830   BUN 11 07/23/2017 0820   CREATININE 0.57 08/26/2019 0830   CALCIUM 9.3 08/26/2019 0830   GFRNONAA >60 08/26/2019 0830   GFRAA >60 08/26/2019 0830    No results found for: INR, PROTIME No results found for: PTT   Kaisley Belmonte is status post extraction of remaining teeth with alveoloplasty and preprosthetic surgery as needed in the operating room on 08/28/2019.  Patient now presents for evaluation of healing and suture removal.  SUBJECTIVE: Patient with some discomfort from previous dental surgery.  Patient only had to use 2 Percocets for pain.  Patient denies any active bleeding.  Patient indicates that the stitches still remain.  EXAM: There is no sign of infection, heme, or ooze.  Sutures are intact.  Patient is healing in by generalized primary closure.  Patient is now completely edentulous.  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  PROCEDURE: The patient was given a chlorhexidine gluconate rinse for 30 seconds. Sutures were then removed without complication. Patient tolerated the procedure well.  ASSESSMENT: Post operative course is consistent with dental procedures performed in the operating room with general anesthesia. 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  PLAN: 1.  Continue salt water rinses every 2 hours while awake to aid healing. 2.  Advance diet as tolerated to a soft diet. 3.  Patient to follow-up with a dentist of her choice for fabrication of upper and lower complete dentures 3 months after the last radiation therapy has been provided.   4.  Patient is now cleared for start of radiation therapy on 09/15/2019.    Lenn Cal, DDS

## 2019-09-08 NOTE — Patient Instructions (Signed)
PLAN: 1.  Continue salt water rinses every 2 hours while awake to aid healing. 2.  Advance diet as tolerated to a soft diet. 3.  Patient to follow-up with a dentist of her choice for fabrication of upper and lower complete dentures 3 months after the last radiation therapy has been provided.   4.  Patient is now cleared for start of radiation therapy on 09/15/2019.    Lenn Cal, DDS

## 2019-09-09 ENCOUNTER — Ambulatory Visit: Payer: PRIVATE HEALTH INSURANCE

## 2019-09-10 ENCOUNTER — Ambulatory Visit: Payer: PRIVATE HEALTH INSURANCE

## 2019-09-11 ENCOUNTER — Ambulatory Visit: Payer: PRIVATE HEALTH INSURANCE | Admitting: Radiation Oncology

## 2019-09-12 ENCOUNTER — Ambulatory Visit: Payer: PRIVATE HEALTH INSURANCE

## 2019-09-12 DIAGNOSIS — Z510 Encounter for antineoplastic radiation therapy: Secondary | ICD-10-CM | POA: Diagnosis not present

## 2019-09-15 ENCOUNTER — Ambulatory Visit
Admission: RE | Admit: 2019-09-15 | Discharge: 2019-09-15 | Disposition: A | Discharge status: Home / Self Care | Payer: PRIVATE HEALTH INSURANCE | Source: Ambulatory Visit | Attending: Radiation Oncology | Admitting: Radiation Oncology

## 2019-09-15 ENCOUNTER — Ambulatory Visit: Payer: No Typology Code available for payment source | Attending: Internal Medicine

## 2019-09-15 ENCOUNTER — Ambulatory Visit: Payer: PRIVATE HEALTH INSURANCE

## 2019-09-15 VITALS — BP 127/69 | HR 66 | Temp 98.6°F | Resp 20 | Ht 64.0 in | Wt 159.8 lb

## 2019-09-15 DIAGNOSIS — Z23 Encounter for immunization: Secondary | ICD-10-CM

## 2019-09-15 DIAGNOSIS — C07 Malignant neoplasm of parotid gland: Secondary | ICD-10-CM

## 2019-09-15 DIAGNOSIS — Z510 Encounter for antineoplastic radiation therapy: Secondary | ICD-10-CM | POA: Diagnosis not present

## 2019-09-15 MED ORDER — SONAFINE EX EMUL
1.0000 "application " | Freq: Two times a day (BID) | CUTANEOUS | Status: DC
  Administered 2019-09-15: 1 via TOPICAL

## 2019-09-15 NOTE — Progress Notes (Signed)
Oncology Nurse Navigator Documentation   To provide support, encouragement and care continuity, met with Ms. Kalama during her initial RT.    Ms. Harkless completed treatment without difficulty, denied questions/concerns.  I walked her around to the nursing/doctors area for her weekly undertreatment visit with Dr. Isidore Moos. I explained she would see Dr. Isidore Moos and Althia Forts RN today and she would be provided education regarding side effects she may experience related to radiation treatments.     I reviewed the registration/arrival procedure for subsequent treatments.  I encouraged her to call me with questions/concerns as treatments proceed.  Harlow Asa RN, BSN, OCN Head & Neck Oncology Nurse [place] at [facility] Phone # [phone]  Fax # [phone]

## 2019-09-15 NOTE — Progress Notes (Signed)
Pt here for patient teaching.    Pt given Radiation and You booklet, Managing Acute Radiation Side Effects for Head and Neck Cancer handout, skin care instructions and Sonafine.    Reviewed areas of pertinence such as hair loss, mouth changes, skin changes, throat changes, earaches and taste changes .   Pt able to give teach back of to pat skin, use unscented/gentle soap and drink plenty of water,apply Sonafine bid and avoid applying anything to skin within 4 hours of treatment.   Pt demonstrated understanding and verbalizes understanding of information given and will contact nursing with any questions or concerns.    Http://rtanswers.org/treatmentinformation/whattoexpect/index   Managing Acute Radiation Side Effects for Head and Neck Cancer  Skin irritation:  . Biafine  Topical Emulsion: First-line topical cream to help soothe skin irritation.  Apply to skin in radiation fields at least 4 hours before radiotherapy, or any time after treatments during the rest of the day.  . Triple Antibiotic Ointment (Neosporin): Apply to areas of skin with moist breakdown to prevent infection.  . 1% hydrocortisone cream: Apply to areas of skin that are itching, up to three times a day.  Arnetha Massy (Silver Sulfadiazine): Used in select cases if large patches of skin develop moist breakdown (let physician or nurse know if you have a "sulfa" drug allergy)  Soreness in mouth or throat: . Baking Soda Rinse: a home remedy to soothe/cleanse mouth and loosen thick saliva.  Mix 1/2 teaspoon salt, 1/2 teaspoon baking soda, 1 pint water.  Swish, gargle and spit as needed to soothe/cleanse mouth. Use as often as you want.  . Sucralfate: coats throat to soothe it before meals or any time of day. Crush 1 tablet in 10 mL H20 and swallow up to four times a day.  . 2% viscous Lidocaine: Soothes mouth and/or throat by numbing your mucous membranes. Mix 1 part 2% viscous lidocaine, 1 part H20. Swish and/or swallow  53mL of this mixture, 5min before meals and at bedtime, up to four times a day. Alternate with Sucralfate.  . Narcotics: Various short acting and long acting narcotics can be prescribed.  Often, medical oncology will prescribe these if you are receiving chemotherapy concurrently. Narcotics may cause constipation. It may be helpful to take a stool softener (Docusate Sodium) or gentle laxative (ie Senna or Polyethylene Glycol) to prevent constipation.  Having food in your stomach before ingesting a narcotic may reduce risk of stomach upset.  Thick Saliva: . Baking Soda Rinse: a home remedy to soothe/cleanse mouth and loosen thick saliva.  Mix 1/2 teaspoon salt, 1/2 teaspoon baking soda, 1 pint water.  Swish, gargle, and spit as needed to soothe/cleanse mouth. Use as often as you want.  . Some patients find Diet Ginger Ale or Papaya Juice to be helpful.  . In extreme cases, your physician may consider prescribing a Scopolamine transdermal patch which dries up your saliva.    Poor taste, or lack of taste:   . There are no well-established medications to combat taste bud changes from radiotherapy.  It often takes weeks to months to regain taste function.  Eating bland foods and drinking nutritional shakes  may help you maintain your weight when food is not enjoyable.  Some patients supplement their oral intake with a feeding tube.  Fatigue and weakness: . There is not a well-established safe and effective medication to combat radiation-induced fatigue.  However, if you are able to perform light exercise (such as a daily walk, yoga, recumbent stationary bicycling),  this may combat fatigue and help you maintain muscle mass during treatment.  . Maintaining hydration and nutrition are also important.  If you have not been referred to a nutritionist and would like a referral, please let your nurse or physician know.  . Try to get at least 8 hours of sleep each night. You may need a daily nap, but try not  to nap so late that it interferes with your nightly sleep schedule.

## 2019-09-15 NOTE — Progress Notes (Signed)
   Covid-19 Vaccination Clinic  Name:  Melissa Case    MRN: [id number] DOB: 11-15-1960  09/15/2019  [name] was observed post Covid-19 immunization for 15 minutes without incident. She was provided with Vaccine Information Sheet and instruction to access the V-Safe system.   [name] was instructed to call 911 with any severe reactions post vaccine: Marland Kitchen Difficulty breathing  . Swelling of face and throat  . A fast heartbeat  . A bad rash all over body  . Dizziness and weakness   Immunizations Administered    Name Date Dose VIS Date Route   Pfizer COVID-19 Vaccine 09/15/2019 12:45 PM 0.3 mL 07/16/2018 Intramuscular   Manufacturer: [place]   Lot: [id number]   [place]: [id number]

## 2019-09-16 ENCOUNTER — Ambulatory Visit
Admission: RE | Admit: 2019-09-16 | Discharge: 2019-09-16 | Disposition: A | Discharge status: Home / Self Care | Payer: PRIVATE HEALTH INSURANCE | Source: Ambulatory Visit | Attending: Radiation Oncology | Admitting: Radiation Oncology

## 2019-09-16 ENCOUNTER — Other Ambulatory Visit: Payer: Self-pay

## 2019-09-16 DIAGNOSIS — Z510 Encounter for antineoplastic radiation therapy: Secondary | ICD-10-CM | POA: Diagnosis not present

## 2019-09-16 DIAGNOSIS — Z923 Personal history of irradiation: Secondary | ICD-10-CM

## 2019-09-16 HISTORY — DX: Personal history of irradiation: Z92.3

## 2019-09-17 ENCOUNTER — Ambulatory Visit
Admission: RE | Admit: 2019-09-17 | Discharge: 2019-09-17 | Disposition: A | Discharge status: Home / Self Care | Payer: PRIVATE HEALTH INSURANCE | Source: Ambulatory Visit | Attending: Radiation Oncology | Admitting: Radiation Oncology

## 2019-09-17 ENCOUNTER — Other Ambulatory Visit: Payer: Self-pay

## 2019-09-17 ENCOUNTER — Encounter: Payer: Self-pay | Admitting: Nutrition

## 2019-09-17 ENCOUNTER — Inpatient Hospital Stay: Payer: PRIVATE HEALTH INSURANCE | Admitting: Nutrition

## 2019-09-17 DIAGNOSIS — Z510 Encounter for antineoplastic radiation therapy: Secondary | ICD-10-CM | POA: Diagnosis not present

## 2019-09-17 NOTE — Progress Notes (Signed)
Patient did not show up for nutrition appointment. 

## 2019-09-18 ENCOUNTER — Ambulatory Visit
Admission: RE | Admit: 2019-09-18 | Discharge: 2019-09-18 | Disposition: A | Discharge status: Home / Self Care | Payer: PRIVATE HEALTH INSURANCE | Source: Ambulatory Visit | Attending: Radiation Oncology | Admitting: Radiation Oncology

## 2019-09-18 ENCOUNTER — Other Ambulatory Visit: Payer: Self-pay

## 2019-09-18 DIAGNOSIS — Z510 Encounter for antineoplastic radiation therapy: Secondary | ICD-10-CM | POA: Diagnosis not present

## 2019-09-19 ENCOUNTER — Ambulatory Visit
Admission: RE | Admit: 2019-09-19 | Discharge: 2019-09-19 | Disposition: A | Discharge status: Home / Self Care | Payer: PRIVATE HEALTH INSURANCE | Source: Ambulatory Visit | Attending: Radiation Oncology | Admitting: Radiation Oncology

## 2019-09-19 ENCOUNTER — Other Ambulatory Visit: Payer: Self-pay

## 2019-09-19 DIAGNOSIS — Z510 Encounter for antineoplastic radiation therapy: Secondary | ICD-10-CM | POA: Diagnosis not present

## 2019-09-22 ENCOUNTER — Ambulatory Visit
Admission: RE | Admit: 2019-09-22 | Discharge: 2019-09-22 | Disposition: A | Discharge status: Home / Self Care | Payer: No Typology Code available for payment source | Source: Ambulatory Visit | Attending: Radiation Oncology | Admitting: Radiation Oncology

## 2019-09-22 ENCOUNTER — Other Ambulatory Visit: Payer: Self-pay

## 2019-09-22 DIAGNOSIS — C07 Malignant neoplasm of parotid gland: Secondary | ICD-10-CM | POA: Insufficient documentation

## 2019-09-22 DIAGNOSIS — Z510 Encounter for antineoplastic radiation therapy: Secondary | ICD-10-CM | POA: Insufficient documentation

## 2019-09-22 NOTE — Progress Notes (Signed)
Oncology Nurse Navigator Documentation  Met with patient during Weekly Under Treat appointment with Dr. Isidore Moos. I went over her change in schedule regarding her nutrition appointments. I gave her a new calendar with new appointment times. She is scheduled to see Dory Peru RD tomorrow after her radiation appointment. I advised her that I will meet her after her radiation treatment and walk her to the nutrition appointment. She is agreeable to these appointments as scheduled.   Harlow Asa RN, BSN, OCN Head & Neck Oncology Nurse [place] at [facility] Phone # [phone]  Fax # [phone]

## 2019-09-23 ENCOUNTER — Other Ambulatory Visit: Payer: Self-pay

## 2019-09-23 ENCOUNTER — Ambulatory Visit
Admission: RE | Admit: 2019-09-23 | Discharge: 2019-09-23 | Disposition: A | Discharge status: Home / Self Care | Payer: No Typology Code available for payment source | Source: Ambulatory Visit | Attending: Radiation Oncology | Admitting: Radiation Oncology

## 2019-09-23 ENCOUNTER — Inpatient Hospital Stay: Payer: No Typology Code available for payment source | Attending: Radiation Oncology | Admitting: Nutrition

## 2019-09-23 DIAGNOSIS — Z510 Encounter for antineoplastic radiation therapy: Secondary | ICD-10-CM | POA: Diagnosis not present

## 2019-09-23 NOTE — Progress Notes (Signed)
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 Patient has completed 7 out of 30 radiation treatments. Weight decreased slightly to 158.4 pounds on May 3. Patient is tolerating some soft foods and is using her blender to blenderized other foods.  She consumes a homemade milkshake every now and then. She drinks the occasional oral nutrition supplement. She verbalizes desire to achieve weight maintenance.  Nutrition diagnosis: Food and nutrition related knowledge deficit improved.  Intervention: Educated patient to consume 1 oral nutrition supplement every day in addition to increasing soft foods. Offered additional samples however patient declined at this time and states she has plenty at home. Reviewed strategies on managing constipation.  Provided fact sheet. Reminded patient to drink more water. Questions were answered.  Teach back method used.  Patient has my contact information.  Monitoring, evaluation, goals: Patient will tolerate adequate calories and protein to minimize weight loss.  Next visit: Wednesday, May 12 after radiation therapy.  **Disclaimer: This note was dictated with voice recognition software. Similar sounding words can inadvertently be transcribed and this note may contain transcription errors which may not have been corrected upon publication of note.**

## 2019-09-23 NOTE — Progress Notes (Signed)
Oncology Nurse Navigator Documentation  I met with patient after her radiation treatment this morning and escorted her to the lobby area to wait for her nutrition appointment with Dory Peru RD today. She voiced her appreciation for the escort. She knows to call me with any concerns or questions she might have.   Harlow Asa RN, BSN, OCN Head & Neck Oncology Nurse [place] at [facility] Phone # [phone]  Fax # [phone]

## 2019-09-24 ENCOUNTER — Ambulatory Visit
Admission: RE | Admit: 2019-09-24 | Discharge: 2019-09-24 | Disposition: A | Discharge status: Home / Self Care | Payer: No Typology Code available for payment source | Source: Ambulatory Visit | Attending: Radiation Oncology | Admitting: Radiation Oncology

## 2019-09-24 ENCOUNTER — Encounter: Payer: No Typology Code available for payment source | Admitting: Nutrition

## 2019-09-24 DIAGNOSIS — Z510 Encounter for antineoplastic radiation therapy: Secondary | ICD-10-CM | POA: Diagnosis not present

## 2019-09-25 ENCOUNTER — Other Ambulatory Visit: Payer: Self-pay

## 2019-09-25 ENCOUNTER — Ambulatory Visit
Admission: RE | Admit: 2019-09-25 | Discharge: 2019-09-25 | Disposition: A | Discharge status: Home / Self Care | Payer: No Typology Code available for payment source | Source: Ambulatory Visit | Attending: Radiation Oncology | Admitting: Radiation Oncology

## 2019-09-25 DIAGNOSIS — Z510 Encounter for antineoplastic radiation therapy: Secondary | ICD-10-CM | POA: Diagnosis not present

## 2019-09-25 DIAGNOSIS — R110 Nausea: Secondary | ICD-10-CM

## 2019-09-25 MED ORDER — ONDANSETRON HCL 4 MG PO TABS: 4.0000 mg | ORAL_TABLET | Freq: Three times a day (TID) | ORAL | 0 refills | Status: DC | PRN

## 2019-09-25 MED ORDER — ONDANSETRON HCL 8 MG PO TABS: 4.0000 mg | ORAL_TABLET | Freq: Three times a day (TID) | ORAL | 0 refills | Status: DC | PRN

## 2019-09-25 NOTE — Progress Notes (Signed)
Patient stopped by after radiation this morning to say she was struggling with nausea for the past few days, and wanted to know if Dr. Isidore Moos could write her a prescription for something. She also mentioned that she doesn't feel like she can continue to work during her treatment, and will be contacting her HR office to get paperwork started so she can be out of work during the remainder of her treatment. [place] [facility] RN Navigator provided patient with RadOnc fax number to give HR representative so we can begin filling out paper work. With Dr. Isidore Moos being out of office received verbal orders from Dr. Sondra Come to prescribe ondasetron PO 4 mg every 8 hours. Orders placed and sent electronically to [facility] on [facility]. No other needs identified at this time, but patient knows to contact clinic should something change.

## 2019-09-26 ENCOUNTER — Ambulatory Visit
Admission: RE | Admit: 2019-09-26 | Discharge: 2019-09-26 | Disposition: A | Discharge status: Home / Self Care | Payer: No Typology Code available for payment source | Source: Ambulatory Visit | Attending: Radiation Oncology | Admitting: Radiation Oncology

## 2019-09-26 ENCOUNTER — Other Ambulatory Visit: Payer: Self-pay

## 2019-09-26 DIAGNOSIS — Z510 Encounter for antineoplastic radiation therapy: Secondary | ICD-10-CM | POA: Diagnosis not present

## 2019-09-29 ENCOUNTER — Ambulatory Visit
Admission: RE | Admit: 2019-09-29 | Discharge: 2019-09-29 | Disposition: A | Discharge status: Home / Self Care | Payer: No Typology Code available for payment source | Source: Ambulatory Visit | Attending: Radiation Oncology | Admitting: Radiation Oncology

## 2019-09-29 DIAGNOSIS — Z510 Encounter for antineoplastic radiation therapy: Secondary | ICD-10-CM | POA: Diagnosis not present

## 2019-09-30 ENCOUNTER — Ambulatory Visit
Admission: RE | Admit: 2019-09-30 | Discharge: 2019-09-30 | Disposition: A | Discharge status: Home / Self Care | Payer: No Typology Code available for payment source | Source: Ambulatory Visit | Attending: Radiation Oncology | Admitting: Radiation Oncology

## 2019-09-30 ENCOUNTER — Other Ambulatory Visit: Payer: Self-pay

## 2019-09-30 DIAGNOSIS — Z510 Encounter for antineoplastic radiation therapy: Secondary | ICD-10-CM | POA: Diagnosis not present

## 2019-10-01 ENCOUNTER — Ambulatory Visit: Payer: No Typology Code available for payment source | Admitting: Nutrition

## 2019-10-01 ENCOUNTER — Ambulatory Visit
Admission: RE | Admit: 2019-10-01 | Discharge: 2019-10-01 | Disposition: A | Discharge status: Home / Self Care | Payer: No Typology Code available for payment source | Source: Ambulatory Visit | Attending: Radiation Oncology | Admitting: Radiation Oncology

## 2019-10-01 ENCOUNTER — Other Ambulatory Visit: Payer: Self-pay

## 2019-10-01 DIAGNOSIS — Z510 Encounter for antineoplastic radiation therapy: Secondary | ICD-10-CM | POA: Diagnosis not present

## 2019-10-01 NOTE — Progress Notes (Signed)
Nutrition follow-up completed with patient after radiation therapy for parotid cancer.  Weight decreased and documented to 154.6 pounds on May 10.  This is down from 158.4 pounds May 3. Patient reports she has occasional nausea and she is taking half of a nausea pill which is helping. She reports there is no pattern to her nausea. She has been trying to eat smaller amounts of food more often and is focusing on soft foods. Reports her taste is altered and she no longer enjoys the same foods. She has not been enjoying Ensure as much lately.  Nutrition diagnosis: Food and nutrition related knowledge deficit improved.  Intervention: Encourage patient to continue taking nausea medications as needed for nausea.  Explained this will help her to increase her calories. Encouraged her to continue her Colace twice daily as this is producing a regular bowel movement. Continue small amounts of soft foods more often. Educated patient on recipes to improve the taste of Ensure and provided a recipe book. Also provided education on taste alterations and provided fact sheets.  Monitoring, evaluation, goals: Patient will work to increase calories and protein to minimize further weight loss.  Next visit: Wednesday, May 19 with Jennet Maduro.  **Disclaimer: This note was dictated with voice recognition software. Similar sounding words can inadvertently be transcribed and this note may contain transcription errors which may not have been corrected upon publication of note.**

## 2019-10-02 ENCOUNTER — Other Ambulatory Visit: Payer: Self-pay

## 2019-10-02 ENCOUNTER — Ambulatory Visit
Admission: RE | Admit: 2019-10-02 | Discharge: 2019-10-02 | Disposition: A | Discharge status: Home / Self Care | Payer: No Typology Code available for payment source | Source: Ambulatory Visit | Attending: Radiation Oncology | Admitting: Radiation Oncology

## 2019-10-02 DIAGNOSIS — Z510 Encounter for antineoplastic radiation therapy: Secondary | ICD-10-CM | POA: Diagnosis not present

## 2019-10-03 ENCOUNTER — Other Ambulatory Visit: Payer: Self-pay

## 2019-10-03 ENCOUNTER — Ambulatory Visit
Admission: RE | Admit: 2019-10-03 | Discharge: 2019-10-03 | Disposition: A | Discharge status: Home / Self Care | Payer: No Typology Code available for payment source | Source: Ambulatory Visit | Attending: Radiation Oncology | Admitting: Radiation Oncology

## 2019-10-03 DIAGNOSIS — Z510 Encounter for antineoplastic radiation therapy: Secondary | ICD-10-CM | POA: Diagnosis not present

## 2019-10-06 ENCOUNTER — Ambulatory Visit: Payer: No Typology Code available for payment source

## 2019-10-06 ENCOUNTER — Ambulatory Visit
Admission: RE | Admit: 2019-10-06 | Discharge: 2019-10-06 | Disposition: A | Discharge status: Home / Self Care | Payer: No Typology Code available for payment source | Source: Ambulatory Visit | Attending: Radiation Oncology | Admitting: Radiation Oncology

## 2019-10-06 DIAGNOSIS — Z510 Encounter for antineoplastic radiation therapy: Secondary | ICD-10-CM | POA: Diagnosis not present

## 2019-10-07 ENCOUNTER — Other Ambulatory Visit: Payer: Self-pay

## 2019-10-07 ENCOUNTER — Ambulatory Visit
Admission: RE | Admit: 2019-10-07 | Discharge: 2019-10-07 | Disposition: A | Discharge status: Home / Self Care | Payer: No Typology Code available for payment source | Source: Ambulatory Visit | Attending: Radiation Oncology | Admitting: Radiation Oncology

## 2019-10-07 DIAGNOSIS — Z510 Encounter for antineoplastic radiation therapy: Secondary | ICD-10-CM | POA: Diagnosis not present

## 2019-10-08 ENCOUNTER — Other Ambulatory Visit: Payer: Self-pay

## 2019-10-08 ENCOUNTER — Ambulatory Visit: Payer: No Typology Code available for payment source

## 2019-10-08 ENCOUNTER — Ambulatory Visit
Admission: RE | Admit: 2019-10-08 | Discharge: 2019-10-08 | Disposition: A | Discharge status: Home / Self Care | Payer: No Typology Code available for payment source | Source: Ambulatory Visit | Attending: Radiation Oncology | Admitting: Radiation Oncology

## 2019-10-08 DIAGNOSIS — Z510 Encounter for antineoplastic radiation therapy: Secondary | ICD-10-CM | POA: Diagnosis not present

## 2019-10-08 NOTE — Progress Notes (Signed)
Nutrition Follow-up:  Patient with parotid cancer currently undergoing radiation treatment.    Met with patient following radiation this am.  Patient reports that this past week she has not had any nausea. Reports bowels are moving better.  She is planning to reduce colace back down to 1 pill per day.  Reports that she has not drank any ensure as does not taste good.  Has been eating eggs, sausage for breakfast and fruit.  Lunch has been tuna fish with crackers and few potato chips. Dinner last night was flounder, mashed potatoes, macaroni and cheese and green beans.     Medications: reviewed  Labs: no new  Anthropometrics:   Weight 154 lb 2 oz on 5/17 stable from 154 lb 6 oz on 5/10.  158 lb 4 oz on 5/3.     NUTRITION DIAGNOSIS: Food and nutrition related knowledge deficit improved   INTERVENTION:  Discussed importance of high calorie, high protein foods and strategies to do this in current eating pattern.   Encouraged patient to continue bowel regimen and add colace BID back if bowels slow down.  Patient has contact information    MONITORING, EVALUATION, GOAL: Patient will consume adequate calories and protein to minimize weight loss   NEXT VISIT: Wednesday, May 27 after radiation  Herbert Marken B. Zenia Resides, [place], [place] Registered Dietitian [phone] (pager)

## 2019-10-09 ENCOUNTER — Ambulatory Visit
Admission: RE | Admit: 2019-10-09 | Discharge: 2019-10-09 | Disposition: A | Discharge status: Home / Self Care | Payer: No Typology Code available for payment source | Source: Ambulatory Visit | Attending: Radiation Oncology | Admitting: Radiation Oncology

## 2019-10-09 DIAGNOSIS — Z510 Encounter for antineoplastic radiation therapy: Secondary | ICD-10-CM | POA: Diagnosis not present

## 2019-10-10 ENCOUNTER — Ambulatory Visit
Admission: RE | Admit: 2019-10-10 | Discharge: 2019-10-10 | Disposition: A | Discharge status: Home / Self Care | Payer: No Typology Code available for payment source | Source: Ambulatory Visit | Attending: Radiation Oncology | Admitting: Radiation Oncology

## 2019-10-10 ENCOUNTER — Other Ambulatory Visit: Payer: Self-pay

## 2019-10-10 DIAGNOSIS — Z510 Encounter for antineoplastic radiation therapy: Secondary | ICD-10-CM | POA: Diagnosis not present

## 2019-10-13 ENCOUNTER — Other Ambulatory Visit: Payer: Self-pay

## 2019-10-13 ENCOUNTER — Ambulatory Visit
Admission: RE | Admit: 2019-10-13 | Discharge: 2019-10-13 | Disposition: A | Discharge status: Home / Self Care | Payer: No Typology Code available for payment source | Source: Ambulatory Visit | Attending: Radiation Oncology | Admitting: Radiation Oncology

## 2019-10-13 DIAGNOSIS — Z510 Encounter for antineoplastic radiation therapy: Secondary | ICD-10-CM | POA: Diagnosis not present

## 2019-10-14 ENCOUNTER — Ambulatory Visit
Admission: RE | Admit: 2019-10-14 | Discharge: 2019-10-14 | Disposition: A | Discharge status: Home / Self Care | Payer: No Typology Code available for payment source | Source: Ambulatory Visit | Attending: Radiation Oncology | Admitting: Radiation Oncology

## 2019-10-14 ENCOUNTER — Other Ambulatory Visit: Payer: Self-pay

## 2019-10-14 DIAGNOSIS — Z510 Encounter for antineoplastic radiation therapy: Secondary | ICD-10-CM | POA: Diagnosis not present

## 2019-10-15 ENCOUNTER — Other Ambulatory Visit: Payer: Self-pay

## 2019-10-15 ENCOUNTER — Ambulatory Visit: Payer: No Typology Code available for payment source

## 2019-10-15 ENCOUNTER — Ambulatory Visit
Admission: RE | Admit: 2019-10-15 | Discharge: 2019-10-15 | Disposition: A | Discharge status: Home / Self Care | Payer: No Typology Code available for payment source | Source: Ambulatory Visit | Attending: Radiation Oncology | Admitting: Radiation Oncology

## 2019-10-15 DIAGNOSIS — Z510 Encounter for antineoplastic radiation therapy: Secondary | ICD-10-CM | POA: Diagnosis not present

## 2019-10-15 NOTE — Progress Notes (Signed)
Nutrition Follow-up:   Patient with parotid cancer currently undergoing radiation treatment.   Met with patient this am following radiation. Patient reports that taste of food is lacking and making it hard to eat.  Reports less nausea. Bowels are moving.  She has been eating 3 meals per day and trying to add 1-2 snacks between meals.  Ate croissant with egg, cheese and ham for breakfast yesterday.  Ate chicken salad for lunch and dinner was pork chop , mashed potatoes. Had ice cream for snack.  Does not like the taste of ensure.      Medications: reviewed  Labs: no new  Anthropometrics:   Weight decreased to 150 lb from 154 lb on 5/17 158 lb on 5/3  NUTRITION DIAGNOSIS: Food and nutrition related knowledge deficit improved   INTERVENTION:  Encouraged patient to try carnation breakfast essentials drink mixed with whole milk.  Discussed adding ice cream as well for additional calories and protein. Drink 1-2 per day. Reviewed ways to add calories Patient has contact information    MONITORING, EVALUATION, GOAL: Patient will consume adequate calories and protein to minimize weight loss.    NEXT VISIT: Wednesday, June 2nd  [place] Zenia Resides, [place], [place] Registered Dietitian [phone] (pager)

## 2019-10-16 ENCOUNTER — Other Ambulatory Visit: Payer: Self-pay

## 2019-10-16 ENCOUNTER — Ambulatory Visit
Admission: RE | Admit: 2019-10-16 | Discharge: 2019-10-16 | Disposition: A | Discharge status: Home / Self Care | Payer: No Typology Code available for payment source | Source: Ambulatory Visit | Attending: Radiation Oncology | Admitting: Radiation Oncology

## 2019-10-16 ENCOUNTER — Ambulatory Visit: Payer: No Typology Code available for payment source

## 2019-10-16 ENCOUNTER — Encounter: Payer: No Typology Code available for payment source | Admitting: Nutrition

## 2019-10-16 DIAGNOSIS — Z510 Encounter for antineoplastic radiation therapy: Secondary | ICD-10-CM | POA: Diagnosis not present

## 2019-10-17 ENCOUNTER — Ambulatory Visit: Payer: No Typology Code available for payment source

## 2019-10-17 ENCOUNTER — Ambulatory Visit
Admission: RE | Admit: 2019-10-17 | Discharge: 2019-10-17 | Disposition: A | Discharge status: Home / Self Care | Payer: No Typology Code available for payment source | Source: Ambulatory Visit | Attending: Radiation Oncology | Admitting: Radiation Oncology

## 2019-10-17 DIAGNOSIS — Z510 Encounter for antineoplastic radiation therapy: Secondary | ICD-10-CM | POA: Diagnosis not present

## 2019-10-20 ENCOUNTER — Ambulatory Visit: Payer: No Typology Code available for payment source

## 2019-10-21 ENCOUNTER — Ambulatory Visit: Payer: PRIVATE HEALTH INSURANCE

## 2019-10-21 ENCOUNTER — Other Ambulatory Visit: Payer: Self-pay

## 2019-10-21 ENCOUNTER — Ambulatory Visit
Admission: RE | Admit: 2019-10-21 | Discharge: 2019-10-21 | Disposition: A | Discharge status: Home / Self Care | Payer: PRIVATE HEALTH INSURANCE | Source: Ambulatory Visit | Attending: Radiation Oncology | Admitting: Radiation Oncology

## 2019-10-21 DIAGNOSIS — Z510 Encounter for antineoplastic radiation therapy: Secondary | ICD-10-CM | POA: Insufficient documentation

## 2019-10-21 DIAGNOSIS — C07 Malignant neoplasm of parotid gland: Secondary | ICD-10-CM | POA: Diagnosis present

## 2019-10-22 ENCOUNTER — Ambulatory Visit: Payer: PRIVATE HEALTH INSURANCE

## 2019-10-22 ENCOUNTER — Other Ambulatory Visit: Payer: Self-pay

## 2019-10-22 ENCOUNTER — Inpatient Hospital Stay: Payer: No Typology Code available for payment source | Attending: Genetic Counselor | Admitting: Nutrition

## 2019-10-22 ENCOUNTER — Ambulatory Visit
Admission: RE | Admit: 2019-10-22 | Discharge: 2019-10-22 | Disposition: A | Discharge status: Home / Self Care | Payer: PRIVATE HEALTH INSURANCE | Source: Ambulatory Visit | Attending: Radiation Oncology | Admitting: Radiation Oncology

## 2019-10-22 DIAGNOSIS — Z510 Encounter for antineoplastic radiation therapy: Secondary | ICD-10-CM | POA: Diagnosis not present

## 2019-10-22 NOTE — Progress Notes (Signed)
Nutrition follow-up completed with patient after radiation therapy for parotid cancer.  Patient's final radiation is Monday, June 7. Weight decreased slightly to 149.2 pounds on June 1 down from 150 pounds last week. Patient continues to have mucositis and thick saliva.   She denies problems swallowing. She continues to report taste alterations. She is tolerating soft foods such as salmon, baked potatoes with sour cream, rice, fried eggs, and [place] toast with syrup. She has not been drinking Ensure secondary to taste. Reports she may have to go back to work sooner than she would like.  Nutrition diagnosis: Food and nutrition related knowledge deficit has improved.  Intervention: Patient educated on strategies for using baking soda and salt water rinses frequently throughout the day to help with mucositis and with taste alterations. Provided sample of boost Soothe for patient to try.  I have offered patient samples if she likes this product.  Also provided coupon and ordering information. Stressed importance of adequate nutrition to promote healing. Questions were answered.  Teach back method used.  Monitoring, evaluation, goals: Patient will tolerate increased calories and protein to promote healing.  Next visit: I will follow-up with patient by telephone on Monday, June 21.  She has my contact information for questions.  **Disclaimer: This note was dictated with voice recognition software. Similar sounding words can inadvertently be transcribed and this note may contain transcription errors which may not have been corrected upon publication of note.**

## 2019-10-23 ENCOUNTER — Ambulatory Visit
Admission: RE | Admit: 2019-10-23 | Discharge: 2019-10-23 | Disposition: A | Discharge status: Home / Self Care | Payer: PRIVATE HEALTH INSURANCE | Source: Ambulatory Visit | Attending: Radiation Oncology | Admitting: Radiation Oncology

## 2019-10-23 ENCOUNTER — Telehealth: Payer: Self-pay | Admitting: *Deleted

## 2019-10-23 ENCOUNTER — Ambulatory Visit: Payer: PRIVATE HEALTH INSURANCE

## 2019-10-23 ENCOUNTER — Other Ambulatory Visit: Payer: Self-pay

## 2019-10-23 DIAGNOSIS — Z510 Encounter for antineoplastic radiation therapy: Secondary | ICD-10-CM | POA: Diagnosis not present

## 2019-10-23 NOTE — Telephone Encounter (Signed)
Received paper work from [employer] for Dr. Isidore Moos, in which was put in a folder for [place] to pick up.

## 2019-10-24 ENCOUNTER — Other Ambulatory Visit: Payer: Self-pay

## 2019-10-24 ENCOUNTER — Ambulatory Visit (HOSPITAL_COMMUNITY): Payer: No Typology Code available for payment source | Admitting: Psychiatry

## 2019-10-24 ENCOUNTER — Ambulatory Visit: Payer: PRIVATE HEALTH INSURANCE

## 2019-10-24 ENCOUNTER — Ambulatory Visit
Admission: RE | Admit: 2019-10-24 | Discharge: 2019-10-24 | Disposition: A | Discharge status: Home / Self Care | Payer: PRIVATE HEALTH INSURANCE | Source: Ambulatory Visit | Attending: Radiation Oncology | Admitting: Radiation Oncology

## 2019-10-24 DIAGNOSIS — Z510 Encounter for antineoplastic radiation therapy: Secondary | ICD-10-CM | POA: Diagnosis not present

## 2019-10-24 NOTE — Progress Notes (Signed)
Oncology Nurse Navigator Documentation  Met with Melissa Case after her final RT to offer support and to celebrate end of radiation treatment.   Provided verbal/written post-RT guidance:  Importance of keeping all follow-up appts, especially those with Nutrition.  Importance of protecting treatment area from sun.  Continuation of Sonafine application 2-3 times daily, application of antibiotic ointment to areas of raw skin; when supply of Sonafine exhausted transition to OTC lotion with vitamin E.  Explained my role as navigator will continue for several more months, encouraged him to call me with needs/concerns.    Harlow Asa RN, BSN, OCN Head & Neck Oncology Nurse [place] at [facility] Phone # [phone]  Fax # [phone]

## 2019-10-27 ENCOUNTER — Encounter: Payer: Self-pay | Admitting: Radiation Oncology

## 2019-10-27 ENCOUNTER — Other Ambulatory Visit: Payer: Self-pay | Admitting: Radiation Oncology

## 2019-10-27 ENCOUNTER — Other Ambulatory Visit: Payer: Self-pay

## 2019-10-27 ENCOUNTER — Ambulatory Visit: Payer: PRIVATE HEALTH INSURANCE

## 2019-10-27 ENCOUNTER — Ambulatory Visit
Admission: RE | Admit: 2019-10-27 | Discharge: 2019-10-27 | Disposition: A | Discharge status: Home / Self Care | Payer: PRIVATE HEALTH INSURANCE | Source: Ambulatory Visit | Attending: Radiation Oncology | Admitting: Radiation Oncology

## 2019-10-27 DIAGNOSIS — C07 Malignant neoplasm of parotid gland: Secondary | ICD-10-CM

## 2019-10-27 DIAGNOSIS — Z510 Encounter for antineoplastic radiation therapy: Secondary | ICD-10-CM | POA: Diagnosis not present

## 2019-10-27 MED ORDER — FLUCONAZOLE 100 MG PO TABS: ORAL_TABLET | ORAL | 0 refills | Status: DC

## 2019-10-30 ENCOUNTER — Telehealth: Payer: Self-pay

## 2019-10-30 NOTE — Telephone Encounter (Signed)
Called patient to let her know that I had the signed "return to work" letter she requested from Dr. Isidore Moos. Asked if she wanted to pick it up from the front lobby or have it mailed to her home. Patient stated she was able to take a picture of the letter since it was also sent to [place], and forwarded that to her HR representative, but asked that I also mail the signed copy so she can have it for her records. No other needs or concerns identified at this time.   Letter placed in envelope and left with [name] to mail with other post later today.

## 2019-11-07 ENCOUNTER — Telehealth: Payer: Self-pay | Admitting: Nutrition

## 2019-11-07 NOTE — Progress Notes (Signed)
Melissa Case presents today for 2 week follow up after completing radiation to right parotid on 10/27/2019  Pain issues, if any: Patient denies any issues today. She reports occasional sharp pain in her right ear, but states it's improving "little by little" Using a feeding tube?: N/A Weight changes, if any:  Wt Readings from Last 3 Encounters:  11/11/19 143 lb (64.9 kg)  09/15/19 159 lb 12.8 oz (72.5 kg)  08/28/19 165 lb 5.5 oz (75 kg)   Swallowing issues, if any: Patient denies. She is states her appetite and sense of taste are starting to come back Smoking or chewing tobacco?  No Using fluoride trays daily? N/A Last ENT visit was on: 09/24/2019 Saw Dr. Fredricka Bonine: "Melissa Case continues to do very well. She is to continue with eye care. 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 I will plan to see her mid July with a CT scan of the neck the same day. She will need a PET/CT and MRI with perineural tumor protocol in October which we will also do here. All of her questions were answered and she will call me if she needs anything." Patient states she is schedule to see Dr. Conley Canal again on 12/03/2019  Other notable issues, if any: Skin in treatment field is healing well. She continues to apply moisturizing lotion daily. She has returned to work and reports she feels she is adjusting well.   Vitals:   11/11/19 1441  BP: 104/73  Pulse: 65  Resp: 18  Temp: (!) 97.5 F (36.4 C)  SpO2: 99%

## 2019-11-07 NOTE — Telephone Encounter (Signed)
Contacted patient to verify telephone visit for pre reg °

## 2019-11-10 ENCOUNTER — Telehealth: Payer: Self-pay | Admitting: Nutrition

## 2019-11-10 ENCOUNTER — Other Ambulatory Visit: Payer: Self-pay

## 2019-11-10 ENCOUNTER — Inpatient Hospital Stay: Payer: No Typology Code available for payment source | Admitting: Nutrition

## 2019-11-10 NOTE — Telephone Encounter (Signed)
Contacted patient by telephone for nutrition follow-up for parotid cancer.   Patient completed radiation therapy on Monday, June 7. Weight was documented as 149.2 pounds on June 1. Patient reports she weighed 142 pounds on her home scale. She reports her appetite is improving and her taste alterations have also improved. She has returned to work. She feels well and reports she is eating better. She denies nutrition impact symptoms.  Nutrition diagnosis: Food and nutrition related knowledge deficit improved.  Intervention: Explained the need for increased calories to promote healing after radiation therapy. Encouraged high-calorie, high-protein foods in small amounts throughout the day. Teach back used.  Monitoring, evaluation, goals: Patient will tolerate increased calories and protein to continue healing and promote weight stabilization.  Next visit: Phone follow-up on Wednesday, July 7.  **Disclaimer: This note was dictated with voice recognition software. Similar sounding words can inadvertently be transcribed and this note may contain transcription errors which may not have been corrected upon publication of note.**

## 2019-11-11 ENCOUNTER — Other Ambulatory Visit: Payer: Self-pay

## 2019-11-11 ENCOUNTER — Ambulatory Visit
Admission: RE | Admit: 2019-11-11 | Discharge: 2019-11-11 | Disposition: A | Discharge status: Home / Self Care | Payer: PRIVATE HEALTH INSURANCE | Source: Ambulatory Visit | Attending: Radiation Oncology | Admitting: Radiation Oncology

## 2019-11-11 VITALS — BP 104/73 | HR 65 | Temp 97.5°F | Resp 18 | Ht 64.0 in | Wt 143.0 lb

## 2019-11-11 DIAGNOSIS — Z79899 Other long term (current) drug therapy: Secondary | ICD-10-CM | POA: Diagnosis not present

## 2019-11-11 DIAGNOSIS — C07 Malignant neoplasm of parotid gland: Secondary | ICD-10-CM | POA: Diagnosis present

## 2019-11-11 DIAGNOSIS — Z923 Personal history of irradiation: Secondary | ICD-10-CM | POA: Insufficient documentation

## 2019-11-11 NOTE — Progress Notes (Signed)
Oncology Nurse Navigator Documentation  I met with Ms. Reddick during her 2 week follow up with Dr. Isidore Moos today. Her skin has healed well and she continues to use sonafine to her radiation site. She will use a lotion containing Vitamin E after the sonafine is completed. She is eating and drinking well and she spoke with Dory Peru RD on 11/10/19 and will talk to her again on 11/26/19. She will see Dr. Conley Canal at [facility] on 12/03/19 and Dr. Isidore Moos in December, 2021. She knows to call me if she has any further needs or concerns.   Harlow Asa RN, BSN, OCN Head & Neck Oncology Nurse [place] at [facility] Phone # [phone]  Fax # [phone]

## 2019-11-12 ENCOUNTER — Encounter (HOSPITAL_COMMUNITY): Payer: Self-pay | Admitting: Psychiatry

## 2019-11-12 ENCOUNTER — Telehealth (INDEPENDENT_AMBULATORY_CARE_PROVIDER_SITE_OTHER): Payer: No Typology Code available for payment source | Admitting: Psychiatry

## 2019-11-12 DIAGNOSIS — F419 Anxiety disorder, unspecified: Secondary | ICD-10-CM | POA: Diagnosis not present

## 2019-11-12 DIAGNOSIS — F330 Major depressive disorder, recurrent, mild: Secondary | ICD-10-CM | POA: Diagnosis not present

## 2019-11-12 MED ORDER — PAROXETINE HCL 40 MG PO TABS: ORAL_TABLET | ORAL | 0 refills | Status: DC

## 2019-11-12 NOTE — Progress Notes (Signed)
Virtual Visit via Telephone Note  I connected with Melissa Case on 11/12/19 at  9:00 AM EDT by telephone and verified that I am speaking with the correct person using two identifiers.  Location: Patient: home Provider: home office   I discussed the limitations, risks, security and privacy concerns of performing an evaluation and management service by telephone and the availability of in person appointments. I also discussed with the patient that there may be a patient responsible charge related to this service. The patient expressed understanding and agreed to proceed.   History of Present Illness: Patient is evaluated by phone session.  She had a surgery for parotid gland tumor and then radiation.  She is happy things are going well.  She is back to work.  Her middle son came to visit her when she was in the hospital.  Her other kids lives in [place].  She was disappointed because recently her father has accident and she could not help him and he need to go to rehab.  Overall she feels post surgery and radiation much better.  She is sleeping good.  She has not taken trazodone and not taking any pain medicine.  Her anxiety and depression is stable.  Her energy level is improving.  Her appetite is also improving.  She like to continue Paxil.  She has no tremors shakes or any EPS.    Past Psychiatric History:Reviewed. Nohistory of psychiatric inpatient treatment, suicidal attempt, psychosis or any hallucination.History ofdepression and anxiety.In the pasttooktrazodone with good response.  Recent Results (from the past 2160 hour(s))  CBC     Status: None   Collection Time: 08/26/19  8:30 AM  Result Value Ref Range   WBC 5.2 4.0 - 10.5 K/uL   RBC 4.76 3.87 - 5.11 MIL/uL   Hemoglobin 13.9 12.0 - 15.0 g/dL   HCT 44.2 36 - 46 %   MCV 92.9 80.0 - 100.0 fL   MCH 29.2 26.0 - 34.0 pg   MCHC 31.4 30.0 - 36.0 g/dL   RDW 13.5 11.5 - 15.5 %   Platelets 206 150 - 400 K/uL   nRBC 0.0 0.0 -  0.2 %    Comment: Performed at [facility], [place] [address] [place], [place] [address]  Basic metabolic panel     Status: None   Collection Time: 08/26/19  8:30 AM  Result Value Ref Range   Sodium 140 135 - 145 mmol/L   Potassium 4.4 3.5 - 5.1 mmol/L   Chloride 104 98 - 111 mmol/L   CO2 24 22 - 32 mmol/L   Glucose, Bld 98 70 - 99 mg/dL    Comment: Glucose reference range applies only to samples taken after fasting for at least 8 hours.   BUN 13 6 - 20 mg/dL   Creatinine, Ser 0.57 0.44 - 1.00 mg/dL   Calcium 9.3 8.9 - 10.3 mg/dL   GFR calc non Af Amer >60 >60 mL/min   GFR calc Af Amer >60 >60 mL/min   Anion gap 12 5 - 15    Comment: Performed at [facility] [address], [place], [place] [address]  SARS CORONAVIRUS 2 (TAT 6-24 HRS) Nasopharyngeal Nasopharyngeal Swab     Status: None   Collection Time: 08/26/19  9:14 AM   Specimen: Nasopharyngeal Swab  Result Value Ref Range   SARS Coronavirus 2 NEGATIVE NEGATIVE    Comment: (NOTE) SARS-CoV-2 target nucleic acids are NOT DETECTED. The SARS-CoV-2 RNA is generally detectable in  upper and lower respiratory specimens during the acute phase of infection. Negative results do not preclude SARS-CoV-2 infection, do not rule out co-infections with other pathogens, and should not be used as the sole basis for treatment or other patient management decisions. Negative results must be combined with clinical observations, patient history, and epidemiological information. The expected result is Negative. Fact Sheet for Patients: SugarRoll.be Fact Sheet for Healthcare Providers: https://www.woods-mathews.com/ This test is not yet approved or cleared by the [place] FDA and  has been authorized for detection and/or diagnosis of SARS-CoV-2 by FDA under an Emergency Use Authorization (EUA). This EUA will remain  in effect (meaning this test can be used) for the duration of the COVID-19  declaration under Section 56 4(b)(1) of the Act, 21 U.S.C. section 360bbb-3(b)(1), unless the authorization is terminated or revoked sooner. Performed at [facility], [place] [address] [place],  [address]      Psychiatric Specialty Exam: Physical Exam  Review of Systems  Weight 143 lb (64.9 kg).There is no height or weight on file to calculate BMI.  General Appearance: NA  Eye Contact:  NA  Speech:  Clear and Coherent  Volume:  Normal  Mood:  Euthymic  Affect:  NA  Thought Process:  Goal Directed  Orientation:  Full (Time, Place, and Person)  Thought Content:  WDL  Suicidal Thoughts:  No  Homicidal Thoughts:  No  Memory:  Immediate;   Good Recent;   Good Remote;   Good  Judgement:  Good  Insight:  Good  Psychomotor Activity:  NA  Concentration:  Concentration: Good and Attention Span: Good  Recall:  Good  Fund of Knowledge:  Good  Language:  Good  Akathisia:  No  Handed:  Right  AIMS (if indicated):     Assets:  Communication Skills Desire for Improvement Housing Resilience Social Support  ADL's:  Intact  Cognition:  WNL  Sleep:   ok      Assessment and Plan: Major depressive disorder, recurrent.  Anxiety.  I reviewed blood work results.  She is doing much better on Paxil and has not required to take trazodone.  She also not taking any narcotic pain medication.  Discussed medication side effects and benefits.  Continue Paxil 40 mg daily.  I also offered therapy but at this time patient does not feel she needed.  Recommended to call us back if she has any question or any concern.  Follow-up in 3 months.  Follow Up Instructions:    I discussed the assessment and treatment plan with the patient. The patient was provided an opportunity to ask questions and all were answered. The patient agreed with the plan and demonstrated an understanding of the instructions.   The patient was advised to call back or seek an in-person evaluation if the symptoms  worsen or if the condition fails to improve as anticipated.  I provided 20 minutes of non-face-to-face time during this encounter.   Kathlee Nations, MD

## 2019-11-15 ENCOUNTER — Encounter: Payer: Self-pay | Admitting: Radiation Oncology

## 2019-11-15 NOTE — Progress Notes (Signed)
Radiation Oncology         [phone] ________________________________  Name: Melissa Case MRN: [id number]  Date: 11/11/2019  DOB: 1960/07/30  Follow-Up Visit Note  CC: Patient, No Pcp Per  Melissa Case, *  Diagnosis and Prior Radiotherapy:       ICD-10-CM   1. 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   [name] presents today for 2 week follow up after completing radiation to right parotid on 10/27/2019  Pain issues, if any: Patient denies any issues today. She reports occasional sharp pain in her right ear, but states it's improving "little by little"  Using a feeding tube?: N/A Weight changes, if any:  Wt Readings from Last 3 Encounters:  11/11/19 143 lb (64.9 kg)  09/15/19 159 lb 12.8 oz (72.5 kg)  08/28/19 165 lb 5.5 oz (75 kg)   Swallowing issues, if any: Patient denies. She is states her appetite and sense of taste are starting to come back  Smoking or chewing tobacco?  No  Using fluoride trays daily? N/A  Last ENT visit was on: 09/24/2019 Saw Dr. Fredricka Case: "Mayu continues to do very well. She is to continue with eye care. 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 I will plan to see her mid July with a CT scan of the neck the same day. She will need a PET/CT and MRI with perineural tumor protocol in October which we will also do here. All of her questions were answered and she will call me if she needs anything." Patient states she is schedule to see Dr. Conley Canal again on 12/03/2019  Other notable issues, if any: Skin in treatment field is healing well. She continues to  apply moisturizing lotion daily. She has returned to work and reports she feels she is adjusting well.   ALLERGIES:  is allergic to morphine and penicillins.  Meds: Current Outpatient Medications  Medication Sig Dispense Refill  . acetaminophen (TYLENOL) 500 MG tablet Take 1,000 mg by mouth every 6 (six) hours as needed for mild pain.    . carboxymethylcellulose (REFRESH PLUS) 0.5 % SOLN Place 1 drop into the right eye as needed (dry eye).     Marland Kitchen esomeprazole (NEXIUM) 40 MG capsule 40 mg at bedtime.    . fluconazole (DIFLUCAN) 100 MG tablet Take 2 tablets today, then 1 tablet daily x 20 more days. 22 tablet 0  . ondansetron (ZOFRAN) 8 MG tablet Take 0.5 tablets (4 mg total) by mouth every 8 (eight) hours as needed for nausea or vomiting. 20 tablet 0  . traZODone (DESYREL) 50 MG tablet Take 1/2 to one tab as needed for insomnia. (Patient taking differently: Take 25 mg by mouth at bedtime as needed (Insomnia). ) 20 tablet 0  . oxyCODONE-acetaminophen (PERCOCET) 5-325 MG tablet Take 1 tablet by mouth every 4 (four) hours as needed for severe pain. (Patient not taking: Reported on 09/08/2019) 20 tablet 0  . PARoxetine (PAXIL) 40 MG tablet TAKE 1 TABLET BY MOUTH (40MG  TOTAL) EVERY MORNING 90 tablet 0   No current facility-administered medications for this encounter.    Physical Findings: The patient is in no  acute distress. Patient is alert and oriented. Wt Readings from Last 3 Encounters:  11/11/19 143 lb (64.9 kg)  09/15/19 159 lb 12.8 oz (72.5 kg)  08/28/19 165 lb 5.5 oz (75 kg)    height is 5\' 4"  (1.626 m) and weight is 143 lb (64.9 kg). Her temporal temperature is 97.5 F (36.4 C) (abnormal). Her blood pressure is 104/73 and her pulse is 65. Her respiration is 18 and oxygen saturation is 99%. .  General: Alert and oriented, in no acute distress HEENT: Head is normocephalic. Extraocular movements are intact. Oropharynx is notable for no lesions, no thrush. Right facial droop  persists. Neck: Neck is notable for no palpable masses Skin: Skin in treatment fields shows satisfactory healing in RT fields Lymphatics: see Neck Exam Psychiatric: Judgment and insight are intact. Affect is appropriate.   Lab Findings: Lab Results  Component Value Date   WBC 5.2 08/26/2019   HGB 13.9 08/26/2019   HCT 44.2 08/26/2019   MCV 92.9 08/26/2019   PLT 206 08/26/2019    Lab Results  Component Value Date   TSH 0.934 07/23/2017    Radiographic Findings: No results found.  Impression/Plan:    1) Head and Neck Cancer Status: healing well from RT  2) Nutritional Status: working on regaining weight PEG tube: none Discussed nutritional options to boost weight.  3) Swallowing: functional  4) Thyroid function: unlikely to be affected by radiotherapy; defer to PCP on screening; will consider labwork here if symptoms warrant.  Lab Results  Component Value Date   TSH 0.934 07/23/2017    5) Other:  Follow-up in otolaryngology with scans at [facility] as scheduled by Dr Conley Canal. F/u with me in 93mo.  The patient was encouraged to call with any issues or questions before then.  On date of service, in total, I spent 20 minutes on this encounter. Patient was seen in person. _____________________________________   Eppie Gibson, MD

## 2019-11-25 ENCOUNTER — Telehealth: Payer: Self-pay | Admitting: Nutrition

## 2019-11-25 ENCOUNTER — Other Ambulatory Visit (HOSPITAL_COMMUNITY): Payer: No Typology Code available for payment source | Admitting: Dentistry

## 2019-11-25 DIAGNOSIS — K0820 Unspecified atrophy of edentulous alveolar ridge: Secondary | ICD-10-CM

## 2019-11-25 DIAGNOSIS — K08109 Complete loss of teeth, unspecified cause, unspecified class: Secondary | ICD-10-CM

## 2019-11-25 NOTE — Telephone Encounter (Signed)
Left message for patient to verify telephone visit for pre reg °

## 2019-11-26 ENCOUNTER — Inpatient Hospital Stay: Payer: No Typology Code available for payment source | Attending: Genetic Counselor | Admitting: Nutrition

## 2019-11-26 ENCOUNTER — Telehealth: Payer: Self-pay | Admitting: Nutrition

## 2019-11-26 NOTE — Telephone Encounter (Signed)
Contacted patient by telephone.  She was unavailable however I was able to leave message for return call with name and phone number.

## 2019-12-02 ENCOUNTER — Other Ambulatory Visit: Payer: Self-pay

## 2019-12-02 ENCOUNTER — Encounter (HOSPITAL_COMMUNITY): Payer: Self-pay | Admitting: Dentistry

## 2019-12-02 ENCOUNTER — Ambulatory Visit (HOSPITAL_COMMUNITY): Payer: Dental | Admitting: Dentistry

## 2019-12-02 VITALS — BP 102/63 | HR 70 | Temp 98.8°F | Wt 150.0 lb

## 2019-12-02 DIAGNOSIS — Z923 Personal history of irradiation: Secondary | ICD-10-CM

## 2019-12-02 DIAGNOSIS — K08109 Complete loss of teeth, unspecified cause, unspecified class: Secondary | ICD-10-CM

## 2019-12-02 DIAGNOSIS — R29810 Facial weakness: Secondary | ICD-10-CM

## 2019-12-02 DIAGNOSIS — K0820 Unspecified atrophy of edentulous alveolar ridge: Secondary | ICD-10-CM

## 2019-12-02 DIAGNOSIS — C07 Malignant neoplasm of parotid gland: Secondary | ICD-10-CM

## 2019-12-02 NOTE — Progress Notes (Signed)
12/02/2019  Patient Name:   Melissa Case Date of Birth:   1960/09/27 Medical Record Number: [id number]  COVID 19 SCREENING: The patient does not symptoms concerning for COVID-19 infection (Including fever, chills, cough, or new SHORTNESS OF BREATH).    BP 102/63 (BP Location: Right Arm)   Pulse 70   Temp 98.8 F (37.1 C)   Wt 150 lb (68 kg)   BMI 25.75 kg/m   [name] presents for oral examination after radiation therapy. Patient has completed radiation treatments from 09/15/2019 to 10/27/2019. There was no chemotherapy.  REVIEW OF CHIEF COMPLAINTS: DRY MOUTH: Yes HARD TO SWALLOW: No  HURT TO SWALLOW: No TASTE CHANGES: Taste has mostly returned to normal. SORES IN MOUTH: No TRISMUS: MIO is 35 mm. Pt. Is using trismus exercises. WEIGHT: 150 lbs down for 14 lbs.  HOME OH REGIMEN:  BRUSHING: Brushes tongue daily FLOSSING: NA RINSING: No rinses. FLUORIDE: NA  TRISMUS EXERCISES:  Maximum interincisal opening: 35 mm Using exercises daily.   DENTAL EXAM:  Oral Hygiene:(PLAQUE): Edentulous LOCATION OF MUCOSITIS: None noted. DESCRIPTION OF SALIVA: Decreased saliva. Foamy saliva. ANY EXPOSED BONE: None noted.  OTHER WATCHED AREAS: Previous extraction sites. Right facial droop after cancer resection surgery. Radiology Interpretation: Patient is edentulous. No evidence of retained roots.  DX:  1. Xerostomia 2. Dysgeusia-resolving. 3. 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  RECOMMENDATIONS: 1. Brush tongue daily. 2. Use trismus exercises as directed. 3. Use Biotene Rinse or salt water/baking soda rinses as needed. 4. Multiple sips of water as needed. 5. Follow up with Prosthodontist for fabrication of dentures to start NO earlier than 01/27/20. Ideally need specialist secondary to right facial drop and aberrant arc of closure.Patientot call for referral to Prosthodontist when she is ready.  Lenn Cal, DDS

## 2019-12-02 NOTE — Patient Instructions (Addendum)
RECOMMENDATIONS: 1. Brush tongue daily. 2. Use trismus exercises as directed. 3. Use Biotene Rinse or salt water/baking soda rinses as needed. 4. Multiple sips of water as needed. 5. Follow up with Prosthodontist for fabrication of dentures to start NO earlier than 01/27/20. Ideally need specialist secondary to right facial drop and aberrant arc of closure. Patient to call for referral to Prosthodontist when she is ready.  Lenn Cal, DDS    TRISMUS  Trismus is a condition where the jaw does not allow the mouth to open as wide as it usually does.  This can happen almost suddenly, or in other cases the process is so slow, it is hard to notice it-until it is too far along.  When the jaw joints and/or muscles have been exposed to radiation treatments, the onset of Trismus is very slow.  This is because the muscles are losing their stretching ability over a long period of time, as long as 2 YEARS after the end of radiation.  It is therefore important to exercise these muscles and joints.  TRISMUS EXERCISES   Stack of tongue depressors measuring the same or a little less than the last documented MIO (Maximum Interincisal Opening).  Secure them with a rubber band on both ends.  Place the stack in the patient's mouth, supporting the other end.  Allow 30 seconds for muscle stretching.  Rest for a few seconds.  Repeat 3-5 times  For all radiation patients, this exercise is recommended in the mornings and evenings unless otherwise instructed.  The exercise should be done for a period of 2 YEARS after the end of radiation.  MIO should be checked routinely on recall dental visits by the general dentist or the hospital dentist.  The patient is advised to report any changes, soreness, or difficulties encountered when doing the exercises.

## 2019-12-05 NOTE — Progress Notes (Signed)
  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   Plan: The patient will follow-up with radiation oncology in 2 weeks . -----------------------------------  Eppie Gibson, MD

## 2020-01-30 DIAGNOSIS — H02106 Unspecified ectropion of left eye, unspecified eyelid: Secondary | ICD-10-CM | POA: Insufficient documentation

## 2020-02-03 ENCOUNTER — Other Ambulatory Visit: Payer: Self-pay

## 2020-02-03 ENCOUNTER — Encounter (HOSPITAL_COMMUNITY): Payer: Self-pay | Admitting: Psychiatry

## 2020-02-03 ENCOUNTER — Telehealth (INDEPENDENT_AMBULATORY_CARE_PROVIDER_SITE_OTHER): Payer: No Typology Code available for payment source | Admitting: Psychiatry

## 2020-02-03 DIAGNOSIS — F419 Anxiety disorder, unspecified: Secondary | ICD-10-CM

## 2020-02-03 DIAGNOSIS — F330 Major depressive disorder, recurrent, mild: Secondary | ICD-10-CM | POA: Diagnosis not present

## 2020-02-03 MED ORDER — PAROXETINE HCL 40 MG PO TABS: ORAL_TABLET | ORAL | 0 refills | Status: DC

## 2020-02-03 NOTE — Progress Notes (Signed)
Virtual Visit via Telephone Note  I connected with [name] on 02/03/20 at  9:00 AM EDT by telephone and verified that I am speaking with the correct person using two identifiers.  Location: Patient: home Provider: home office   I discussed the limitations, risks, security and privacy concerns of performing an evaluation and management service by telephone and the availability of in person appointments. I also discussed with the patient that there may be a patient responsible charge related to this service. The patient expressed understanding and agreed to proceed.   History of Present Illness: Patient is evaluated by phone session.  She finished radiation on June 7.  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  Now she is seeing dentist to help the dentures so she can eat better.  She lost the weight because not able to eat as much.  She admitted feeling sad as father died in Dec 27, 2022.  He developed infection and later on sepsis he was at assisted living facility and could not get better  after infection to spread to the body.  His siblings came from out of town and it helped.  His children also came to visit her and she was happy about it.  She denies any irritability, panic attack, anxiety, crying spells or any feeling of hopelessness or worthlessness.  She sleeps good and does not take trazodone.  Recently she had a CT scan post surgery and radiation and she was told results are good.  Patient wants to keep the Paxil.  She is working and she keep herself busy at her job and like it.  She has no tremors shakes or any EPS.  Her energy level is okay.  Past Psychiatric History: H/O anxiety and depression. Noh/o inpatient treatment, suicidal attempt, or psychosis. Trazodone worked.    Psychiatric Specialty Exam: Physical Exam  Review of Systems  Weight 144 lb (65.3 kg).There is no height or weight on file to calculate BMI.  General Appearance: NA  Eye  Contact:  NA  Speech:  Slow  Volume:  Normal  Mood:  Dysphoric  Affect:  NA  Thought Process:  Goal Directed  Orientation:  Full (Time, Place, and Person)  Thought Content:  Logical  Suicidal Thoughts:  No  Homicidal Thoughts:  No  Memory:  Immediate;   Good Recent;   Good Remote;   Good  Judgement:  Intact  Insight:  Present  Psychomotor Activity:  NA  Concentration:  Concentration: Fair and Attention Span: Fair  Recall:  [place] of Knowledge:  Good  Language:  Good  Akathisia:  No  Handed:  Right  AIMS (if indicated):     Assets:  Communication Skills Desire for Improvement Housing Resilience Social Support Transportation  ADL's:  Intact  Cognition:  WNL  Sleep:   ok      Assessment and Plan: Major depressive disorder, recurrent.  Anxiety.  Condolence given about recent loss of her father.  Recommended group therapy but patient does not feel she needed.  She wants to keep the Paxil current dose since it is working well.  She has no tremors shakes or any EPS.  Recommended to call us back if she is any question or any concern.  Follow-up in 3 months.  Follow Up Instructions:    I discussed the assessment and treatment plan with the patient. The patient was provided an opportunity to ask questions and all were answered. The patient agreed with the  plan and demonstrated an understanding of the instructions.   The patient was advised to call back or seek an in-person evaluation if the symptoms worsen or if the condition fails to improve as anticipated.  I provided 20 minutes of non-face-to-face time during this encounter.   Kathlee Nations, MD

## 2020-02-12 ENCOUNTER — Telehealth (HOSPITAL_COMMUNITY): Payer: No Typology Code available for payment source | Admitting: Psychiatry

## 2020-03-21 ENCOUNTER — Inpatient Hospital Stay (HOSPITAL_COMMUNITY)
Admission: EM | Admit: 2020-03-21 | Discharge: 2020-03-24 | DRG: 390 | Disposition: A | Discharge status: Home / Self Care | Payer: PRIVATE HEALTH INSURANCE | Attending: General Surgery | Admitting: General Surgery

## 2020-03-21 ENCOUNTER — Emergency Department (HOSPITAL_COMMUNITY): Payer: PRIVATE HEALTH INSURANCE

## 2020-03-21 ENCOUNTER — Encounter (HOSPITAL_COMMUNITY): Payer: Self-pay | Admitting: Emergency Medicine

## 2020-03-21 ENCOUNTER — Other Ambulatory Visit: Payer: Self-pay

## 2020-03-21 DIAGNOSIS — K565 Intestinal adhesions [bands], unspecified as to partial versus complete obstruction: Secondary | ICD-10-CM | POA: Diagnosis not present

## 2020-03-21 DIAGNOSIS — K219 Gastro-esophageal reflux disease without esophagitis: Secondary | ICD-10-CM | POA: Diagnosis present

## 2020-03-21 DIAGNOSIS — Z90710 Acquired absence of both cervix and uterus: Secondary | ICD-10-CM

## 2020-03-21 DIAGNOSIS — Z880 Allergy status to penicillin: Secondary | ICD-10-CM

## 2020-03-21 DIAGNOSIS — F32A Depression, unspecified: Secondary | ICD-10-CM | POA: Diagnosis present

## 2020-03-21 DIAGNOSIS — Z885 Allergy status to narcotic agent status: Secondary | ICD-10-CM

## 2020-03-21 DIAGNOSIS — Z85818 Personal history of malignant neoplasm of other sites of lip, oral cavity, and pharynx: Secondary | ICD-10-CM

## 2020-03-21 DIAGNOSIS — D490 Neoplasm of unspecified behavior of digestive system: Secondary | ICD-10-CM | POA: Diagnosis present

## 2020-03-21 DIAGNOSIS — Z9049 Acquired absence of other specified parts of digestive tract: Secondary | ICD-10-CM

## 2020-03-21 DIAGNOSIS — K56609 Unspecified intestinal obstruction, unspecified as to partial versus complete obstruction: Secondary | ICD-10-CM | POA: Diagnosis not present

## 2020-03-21 DIAGNOSIS — F17210 Nicotine dependence, cigarettes, uncomplicated: Secondary | ICD-10-CM | POA: Diagnosis present

## 2020-03-21 DIAGNOSIS — Z9221 Personal history of antineoplastic chemotherapy: Secondary | ICD-10-CM

## 2020-03-21 DIAGNOSIS — Z79899 Other long term (current) drug therapy: Secondary | ICD-10-CM

## 2020-03-21 DIAGNOSIS — Z20822 Contact with and (suspected) exposure to covid-19: Secondary | ICD-10-CM | POA: Diagnosis present

## 2020-03-21 DIAGNOSIS — Z0189 Encounter for other specified special examinations: Secondary | ICD-10-CM

## 2020-03-21 DIAGNOSIS — Z818 Family history of other mental and behavioral disorders: Secondary | ICD-10-CM

## 2020-03-21 LAB — LIPASE, BLOOD: Lipase: 36 U/L (ref 11–51)

## 2020-03-21 LAB — CBC WITH DIFFERENTIAL/PLATELET
Abs Immature Granulocytes: 0.01 10*3/uL (ref 0.00–0.07)
Basophils Absolute: 0.1 10*3/uL (ref 0.0–0.1)
Basophils Relative: 1 %
Eosinophils Absolute: 0.1 10*3/uL (ref 0.0–0.5)
Eosinophils Relative: 2 %
HCT: 41 % (ref 36.0–46.0)
Hemoglobin: 13.5 g/dL (ref 12.0–15.0)
Immature Granulocytes: 0 %
Lymphocytes Relative: 20 %
Lymphs Abs: 1 10*3/uL (ref 0.7–4.0)
MCH: 29.8 pg (ref 26.0–34.0)
MCHC: 32.9 g/dL (ref 30.0–36.0)
MCV: 90.5 fL (ref 80.0–100.0)
Monocytes Absolute: 0.4 10*3/uL (ref 0.1–1.0)
Monocytes Relative: 8 %
Neutro Abs: 3.4 10*3/uL (ref 1.7–7.7)
Neutrophils Relative %: 69 %
Platelets: 230 10*3/uL (ref 150–400)
RBC: 4.53 MIL/uL (ref 3.87–5.11)
RDW: 13.2 % (ref 11.5–15.5)
WBC: 4.9 10*3/uL (ref 4.0–10.5)
nRBC: 0 % (ref 0.0–0.2)

## 2020-03-21 LAB — COMPREHENSIVE METABOLIC PANEL
ALT: 13 U/L (ref 0–44)
AST: 14 U/L — ABNORMAL LOW (ref 15–41)
Albumin: 4 g/dL (ref 3.5–5.0)
Alkaline Phosphatase: 62 U/L (ref 38–126)
Anion gap: 12 (ref 5–15)
BUN: 14 mg/dL (ref 6–20)
CO2: 24 mmol/L (ref 22–32)
Calcium: 9.4 mg/dL (ref 8.9–10.3)
Chloride: 102 mmol/L (ref 98–111)
Creatinine, Ser: 0.61 mg/dL (ref 0.44–1.00)
GFR, Estimated: >60 mL/min (ref >60)
Glucose, Bld: 114 mg/dL — ABNORMAL HIGH (ref 70–99)
Potassium: 3.6 mmol/L (ref 3.5–5.1)
Sodium: 138 mmol/L (ref 135–145)
Total Bilirubin: 0.7 mg/dL (ref 0.3–1.2)
Total Protein: 7.1 g/dL (ref 6.5–8.1)

## 2020-03-21 LAB — URINALYSIS, ROUTINE W REFLEX MICROSCOPIC
Bilirubin Urine: NEGATIVE
Glucose, UA: NEGATIVE mg/dL
Hgb urine dipstick: NEGATIVE
Ketones, ur: NEGATIVE mg/dL
Leukocytes,Ua: NEGATIVE
Nitrite: NEGATIVE
Protein, ur: NEGATIVE mg/dL
Specific Gravity, Urine: 1.008 (ref 1.005–1.030)
pH: 6 (ref 5.0–8.0)

## 2020-03-21 MED ORDER — IOHEXOL 300 MG/ML  SOLN
100.0000 mL | Freq: Once | INTRAMUSCULAR | Status: AC | PRN
  Administered 2020-03-21: 100 mL via INTRAVENOUS

## 2020-03-21 MED ORDER — ONDANSETRON HCL 4 MG/2ML IJ SOLN
4.0000 mg | Freq: Once | INTRAMUSCULAR | Status: AC
  Administered 2020-03-21: 4 mg via INTRAVENOUS
  Filled 2020-03-21: qty 2

## 2020-03-21 MED ORDER — SODIUM CHLORIDE 0.9 % IV BOLUS
500.0000 mL | Freq: Once | INTRAVENOUS | Status: AC
  Administered 2020-03-21: 500 mL via INTRAVENOUS

## 2020-03-21 MED ORDER — PAROXETINE HCL 20 MG PO TABS
40.0000 mg | ORAL_TABLET | Freq: Every day | ORAL | Status: DC
  Administered 2020-03-23 – 2020-03-24 (×2): 40 mg via ORAL
  Filled 2020-03-21 (×3): qty 2

## 2020-03-21 MED ORDER — FENTANYL CITRATE (PF) 100 MCG/2ML IJ SOLN
50.0000 ug | Freq: Once | INTRAMUSCULAR | Status: AC
  Administered 2020-03-21: 50 ug via INTRAVENOUS
  Filled 2020-03-21: qty 2

## 2020-03-21 MED ORDER — SODIUM CHLORIDE 0.9 % IV SOLN: Freq: Once | INTRAVENOUS | Status: AC

## 2020-03-21 MED ORDER — TRAZODONE HCL 50 MG PO TABS: 25.0000 mg | ORAL_TABLET | Freq: Every evening | ORAL | Status: DC | PRN

## 2020-03-21 MED ORDER — ERYTHROMYCIN 5 MG/GM OP OINT
1.0000 "application " | TOPICAL_OINTMENT | Freq: Every day | OPHTHALMIC | Status: DC
  Filled 2020-03-21: qty 1

## 2020-03-21 MED ORDER — SODIUM CHLORIDE (PF) 0.9 % IJ SOLN
INTRAMUSCULAR | Status: AC
  Filled 2020-03-21: qty 50

## 2020-03-21 MED ORDER — CARBOXYMETHYLCELLULOSE SODIUM 0.5 % OP SOLN: 1.0000 [drp] | Freq: Every day | OPHTHALMIC | Status: DC | PRN

## 2020-03-21 MED ORDER — FENTANYL CITRATE (PF) 100 MCG/2ML IJ SOLN
50.0000 ug | Freq: Once | INTRAMUSCULAR | Status: AC
  Administered 2020-03-22: 50 ug via INTRAVENOUS
  Filled 2020-03-21: qty 2

## 2020-03-21 NOTE — ED Provider Notes (Signed)
[facility] DEPT Provider Note   CSN: [id number] Arrival date & time: 03/21/20  2007     History Chief Complaint  Patient presents with  . Abdominal Pain    Melissa Case is a 59 y.o. female.  The history is provided by the patient and medical records.  Abdominal Pain  Melissa Case is a 59 y.o. female who presents to the Emergency Department complaining of abdominal pain.  She presents to the ED complaining of RLQ abdominal pain that began at 1630 today.  Pain is described as tightening/spasm in nature.  Pain is waxing and waning, nonradiating.  Has nausea.  Had similar episode one year ago and no source found.  No recent injuries.    Denies fevers, vomiting, diarrhea, constipation, dysuria, cough sob.   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 S/p cholecystectomy.    Fully vaccinated for [place].      Past Medical History:  Diagnosis Date  . Cancer (Melissa Case)    Partoid  . Depression   . GERD (gastroesophageal reflux disease)   . Panic attack     Patient Active Problem List   Diagnosis Date Noted  . 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 Acute calculous cholecystitis 11/21/2017  . Abdominal pain 09/30/2013  . SBO (small bowel obstruction) ([place]) 09/30/2013  . 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 Gall stone 04/05/2012  . Depression 09/15/2011    Past Surgical History:  Procedure Laterality Date  . ABDOMINAL HYSTERECTOMY    . CHOLECYSTECTOMY N/A 11/21/2017   Procedure: LAPAROSCOPIC CHOLECYSTECTOMY WITH INTRAOPERATIVE CHOLANGIOGRAM;  Surgeon: Jovita Kussmaul, MD;  Location: [facility];  Service: General;  Laterality: N/A;  . MULTIPLE EXTRACTIONS WITH ALVEOLOPLASTY N/A 08/28/2019   Procedure: Extraction of tooth #'s 3-7, 10-15, and 20-31 with alveoloplatsy, maxillary right buccal exostosis reduction, and bilateral mandibular tori reductions.;  Surgeon: Lenn Cal, DDS;  Location: [facility];  Service: Oral  Surgery;  Laterality: N/A;  . PAROTIDECTOMY Right 07/22/2019   with biopsy; done at [facility] by Dr. Fredricka Bonine     OB History   No obstetric history on file.     Family History  Problem Relation Age of Onset  . Depression Sister   . Heart disease Mother   . Diabetes Father   . Breast cancer Paternal Grandmother     Social History   Tobacco Use  . Smoking status: Current Every Day Smoker    Packs/day: 0.50    Years: 42.00    Pack years: 21.00    Types: Cigarettes    Start date: 42  . Smokeless tobacco: Never Used  Vaping Use  . Vaping Use: Never used  Substance Use Topics  . Alcohol use: No    Alcohol/week: 0.0 standard drinks  . Drug use: No    Home Medications Prior to Admission medications   Medication Sig Start Date End Date Taking? Authorizing Provider  acetaminophen (TYLENOL) 500 MG tablet Take 1,000 mg by mouth every 6 (six) hours as needed for mild pain.   Yes [provider]  carboxymethylcellulose (REFRESH PLUS) 0.5 % SOLN Place 1 drop into the right eye daily as needed (dry eye).    Yes [provider]  erythromycin ophthalmic ointment Place 1 application into the right eye in the morning and at bedtime.  03/02/20  Yes [provider]  esomeprazole (NEXIUM) 40 MG capsule Take 40 mg by mouth daily.  05/10/15  Yes Smothers, Neoma Laming  N, NP  PARoxetine (PAXIL) 40 MG tablet TAKE 1 TABLET BY MOUTH (40MG  TOTAL) EVERY MORNING Patient taking differently: Take 40 mg by mouth daily.  02/03/20  Yes Arfeen, Arlyce Harman, MD  traZODone (DESYREL) 50 MG tablet Take 1/2 to one tab as needed for insomnia. Patient taking differently: Take 25 mg by mouth at bedtime as needed for sleep.  07/21/19  Yes Arfeen, Arlyce Harman, MD    Allergies    Morphine and Penicillins  Review of Systems   Review of Systems  Gastrointestinal: Positive for abdominal pain.  All other systems reviewed and are negative.   Physical Exam Updated Vital Signs BP 124/80 (BP  Location: Left Arm)   Pulse 72   Temp 97.8 F (36.6 C) (Oral)   Resp 18   SpO2 100%   Physical Exam Vitals and nursing note reviewed.  Constitutional:      Appearance: She is well-developed.  HENT:     Head: Normocephalic and atraumatic.  Cardiovascular:     Rate and Rhythm: Normal rate and regular rhythm.  Pulmonary:     Effort: Pulmonary effort is normal. No respiratory distress.  Abdominal:     Palpations: Abdomen is soft.     Tenderness: There is no rebound.     Comments: Moderate RLQ tenderness, vol guarding, no rebound  Musculoskeletal:        General: No tenderness.  Skin:    General: Skin is warm and dry.  Neurological:     Mental Status: She is alert and oriented to person, place, and time.  Psychiatric:        Behavior: Behavior normal.     ED Results / Procedures / Treatments   Labs (all labs ordered are listed, but only abnormal results are displayed) Labs Reviewed  COMPREHENSIVE METABOLIC PANEL - Abnormal; Notable for the following components:      Result Value   Glucose, Bld 114 (*)    AST 14 (*)    All other components within normal limits  URINALYSIS, ROUTINE W REFLEX MICROSCOPIC - Abnormal; Notable for the following components:   Color, Urine STRAW (*)    All other components within normal limits  URINE CULTURE  RESPIRATORY PANEL BY RT PCR (FLU A&B, COVID)  CBC WITH DIFFERENTIAL/PLATELET  LIPASE, BLOOD    EKG None  Radiology CT Abdomen Pelvis W Contrast  Result Date: 03/21/2020 CLINICAL DATA:  Right lower quadrant abdominal pain, left lower quadrant abdominal pain EXAM: CT ABDOMEN AND PELVIS WITH CONTRAST TECHNIQUE: Multidetector CT imaging of the abdomen and pelvis was performed using the standard protocol following bolus administration of intravenous contrast. CONTRAST:  193mL OMNIPAQUE IOHEXOL 300 MG/ML  SOLN COMPARISON:  02/24/2019 FINDINGS: Lower chest: Mild bibasilar atelectasis the visualized heart and pericardium are unremarkable.  Hepatobiliary: Status post cholecystectomy. Liver unremarkable. No intra or extrahepatic biliary ductal dilation. Pancreas: Unremarkable Spleen: Unremarkable Adrenals/Urinary Tract: Adrenal glands are unremarkable. Kidneys are normal, without renal calculi, focal lesion, or hydronephrosis. Bladder is unremarkable. Stomach/Bowel: There is a high-grade distal small-bowel obstruction with the point of transition seen within the right lower quadrant immediately subjacent to the abdominal wall adjacent to the inferior epigastric vasculature at axial image # 57/2 and sagittal image # 71/7. There is fecalization of intraluminal contents within the small bowel proximal to the point of obstruction secondary to stasis. Distally, the small bowel is decompressed. The large bowel is unremarkable. Appendix normal. No free intraperitoneal gas or fluid. Vascular/Lymphatic: Extensive aortoiliac atherosclerotic calcification without evidence of aneurysm. No  pathologic adenopathy within the abdomen and pelvis. Reproductive: Uterus absent. Pelvic lymph node dissection has been performed. No adnexal masses are seen. Other: Rectum unremarkable Musculoskeletal: No acute bone abnormality. IMPRESSION: High-grade distal small-bowel obstruction with the point of transition within the anterior right lower quadrant likely related to an underlying adhesion. No evidence of perforation. No ischemia. Aortic Atherosclerosis (ICD10-I70.0). Electronically Signed   By: Fidela Salisbury MD   On: 03/21/2020 23:18    Procedures Procedures (including critical care time)  Medications Ordered in ED Medications  sodium chloride (PF) 0.9 % injection (has no administration in time range)  fentaNYL (SUBLIMAZE) injection 50 mcg (has no administration in time range)  0.9 %  sodium chloride infusion (has no administration in time range)  fentaNYL (SUBLIMAZE) injection 50 mcg (50 mcg Intravenous Given 03/21/20 2106)  sodium chloride 0.9 % bolus 500 mL (500  mLs Intravenous New Bag/Given 03/21/20 2104)  ondansetron (ZOFRAN) injection 4 mg (4 mg Intravenous Given 03/21/20 2105)  iohexol (OMNIPAQUE) 300 MG/ML solution 100 mL (100 mLs Intravenous Contrast Given 03/21/20 2249)    ED Course  I have reviewed the triage vital signs and the nursing notes.  Pertinent labs & imaging results that were available during my care of the patient were reviewed by me and considered in my medical decision making (see chart for details).    MDM Rules/Calculators/A&P                         patient with history of parotid tumor status post resection here for evaluation of right lower quadrant pain that started earlier today. She has focal tenderness on examination. Labs without acute abnormality. CT abdomen pelvis was obtained, which demonstrates bowel obstruction with transition point in the right lower abdomen. Discussed with patient findings of studies recommendation for mission and she is in agreement treatment plan. Discussed with Dr. Donne Hazel with general surgery, who will see the patient in consult.  Final Clinical Impression(s) / ED Diagnoses Final diagnoses:  None    Rx / DC Orders ED Discharge Orders    None       Quintella Reichert, MD 03/21/20 2342

## 2020-03-21 NOTE — ED Triage Notes (Signed)
Pt presents with LLQ abdominal pain radiating to her groin that started at 430p today. Denies N/V/D. Denies dysuria. States that it started while she was at work, no injury, no known precipitating factors.

## 2020-03-22 ENCOUNTER — Inpatient Hospital Stay (HOSPITAL_COMMUNITY): Payer: PRIVATE HEALTH INSURANCE

## 2020-03-22 DIAGNOSIS — Z9049 Acquired absence of other specified parts of digestive tract: Secondary | ICD-10-CM | POA: Diagnosis not present

## 2020-03-22 DIAGNOSIS — Z20822 Contact with and (suspected) exposure to covid-19: Secondary | ICD-10-CM | POA: Diagnosis present

## 2020-03-22 DIAGNOSIS — F17210 Nicotine dependence, cigarettes, uncomplicated: Secondary | ICD-10-CM | POA: Diagnosis present

## 2020-03-22 DIAGNOSIS — Z880 Allergy status to penicillin: Secondary | ICD-10-CM | POA: Diagnosis not present

## 2020-03-22 DIAGNOSIS — Z818 Family history of other mental and behavioral disorders: Secondary | ICD-10-CM | POA: Diagnosis not present

## 2020-03-22 DIAGNOSIS — Z90710 Acquired absence of both cervix and uterus: Secondary | ICD-10-CM | POA: Diagnosis not present

## 2020-03-22 DIAGNOSIS — Z85818 Personal history of malignant neoplasm of other sites of lip, oral cavity, and pharynx: Secondary | ICD-10-CM | POA: Diagnosis not present

## 2020-03-22 DIAGNOSIS — Z9221 Personal history of antineoplastic chemotherapy: Secondary | ICD-10-CM | POA: Diagnosis not present

## 2020-03-22 DIAGNOSIS — K219 Gastro-esophageal reflux disease without esophagitis: Secondary | ICD-10-CM | POA: Diagnosis present

## 2020-03-22 DIAGNOSIS — K56609 Unspecified intestinal obstruction, unspecified as to partial versus complete obstruction: Secondary | ICD-10-CM | POA: Diagnosis present

## 2020-03-22 DIAGNOSIS — D490 Neoplasm of unspecified behavior of digestive system: Secondary | ICD-10-CM | POA: Diagnosis present

## 2020-03-22 DIAGNOSIS — Z79899 Other long term (current) drug therapy: Secondary | ICD-10-CM | POA: Diagnosis not present

## 2020-03-22 DIAGNOSIS — F32A Depression, unspecified: Secondary | ICD-10-CM | POA: Diagnosis present

## 2020-03-22 DIAGNOSIS — K565 Intestinal adhesions [bands], unspecified as to partial versus complete obstruction: Secondary | ICD-10-CM | POA: Diagnosis present

## 2020-03-22 DIAGNOSIS — Z885 Allergy status to narcotic agent status: Secondary | ICD-10-CM | POA: Diagnosis not present

## 2020-03-22 LAB — CBC
HCT: 40.5 % (ref 36.0–46.0)
Hemoglobin: 13.1 g/dL (ref 12.0–15.0)
MCH: 29.7 pg (ref 26.0–34.0)
MCHC: 32.3 g/dL (ref 30.0–36.0)
MCV: 91.8 fL (ref 80.0–100.0)
Platelets: 226 K/uL (ref 150–400)
RBC: 4.41 MIL/uL (ref 3.87–5.11)
RDW: 13.3 % (ref 11.5–15.5)
WBC: 6.5 K/uL (ref 4.0–10.5)
nRBC: 0 % (ref 0.0–0.2)

## 2020-03-22 LAB — BASIC METABOLIC PANEL
Anion gap: 9 (ref 5–15)
BUN: 11 mg/dL (ref 6–20)
CO2: 26 mmol/L (ref 22–32)
Calcium: 9 mg/dL (ref 8.9–10.3)
Chloride: 105 mmol/L (ref 98–111)
Creatinine, Ser: 0.62 mg/dL (ref 0.44–1.00)
GFR, Estimated: >60 mL/min (ref >60)
Glucose, Bld: 130 mg/dL — ABNORMAL HIGH (ref 70–99)
Potassium: 3.9 mmol/L (ref 3.5–5.1)
Sodium: 140 mmol/L (ref 135–145)

## 2020-03-22 LAB — RESPIRATORY PANEL BY RT PCR (FLU A&B, COVID)
Influenza A by PCR: NEGATIVE
Influenza B by PCR: NEGATIVE
SARS Coronavirus 2 by RT PCR: NEGATIVE

## 2020-03-22 LAB — URINE CULTURE

## 2020-03-22 MED ORDER — ACETAMINOPHEN 650 MG RE SUPP: 650.0000 mg | Freq: Four times a day (QID) | RECTAL | Status: DC | PRN

## 2020-03-22 MED ORDER — ENOXAPARIN SODIUM 40 MG/0.4ML ~~LOC~~ SOLN
40.0000 mg | SUBCUTANEOUS | Status: DC
  Administered 2020-03-22 – 2020-03-24 (×3): 40 mg via SUBCUTANEOUS
  Filled 2020-03-22 (×3): qty 0.4

## 2020-03-22 MED ORDER — ACETAMINOPHEN 325 MG PO TABS: 650.0000 mg | ORAL_TABLET | Freq: Four times a day (QID) | ORAL | Status: DC | PRN

## 2020-03-22 MED ORDER — DIATRIZOATE MEGLUMINE & SODIUM 66-10 % PO SOLN
90.0000 mL | Freq: Once | ORAL | Status: AC
  Administered 2020-03-22: 90 mL via ORAL
  Filled 2020-03-22: qty 90

## 2020-03-22 MED ORDER — DIATRIZOATE MEGLUMINE & SODIUM 66-10 % PO SOLN
90.0000 mL | Freq: Once | ORAL | Status: DC
  Filled 2020-03-22: qty 90

## 2020-03-22 MED ORDER — DIPHENHYDRAMINE HCL 50 MG/ML IJ SOLN: 12.5000 mg | Freq: Three times a day (TID) | INTRAMUSCULAR | Status: DC | PRN

## 2020-03-22 MED ORDER — HYDROMORPHONE HCL 1 MG/ML IJ SOLN: 1.0000 mg | INTRAMUSCULAR | Status: DC | PRN

## 2020-03-22 MED ORDER — ONDANSETRON HCL 4 MG/2ML IJ SOLN: 4.0000 mg | Freq: Four times a day (QID) | INTRAMUSCULAR | Status: DC | PRN

## 2020-03-22 MED ORDER — ONDANSETRON 4 MG PO TBDP: 4.0000 mg | ORAL_TABLET | Freq: Four times a day (QID) | ORAL | Status: DC | PRN

## 2020-03-22 MED ORDER — POLYVINYL ALCOHOL 1.4 % OP SOLN
1.0000 [drp] | Freq: Every day | OPHTHALMIC | Status: DC | PRN
  Filled 2020-03-22: qty 15

## 2020-03-22 MED ORDER — PANTOPRAZOLE SODIUM 40 MG IV SOLR
40.0000 mg | Freq: Every day | INTRAVENOUS | Status: DC
  Administered 2020-03-22: 40 mg via INTRAVENOUS
  Filled 2020-03-22: qty 40

## 2020-03-22 MED ORDER — SODIUM CHLORIDE 0.9 % IV SOLN: INTRAVENOUS | Status: DC

## 2020-03-22 NOTE — Progress Notes (Signed)
Partial SBO  Subjective: States she feels better.  No BM yet  Objective: Vital signs in last 24 hours: Temp:  [97.8 F (36.6 C)-98.1 F (36.7 C)] 98 F (36.7 C) (11/01 0827) Pulse Rate:  [65-72] 65 (11/01 0827) Resp:  [16-19] 18 (11/01 0827) BP: (108-139)/(57-80) 108/65 (11/01 0827) SpO2:  [95 %-100 %] 98 % (11/01 0827) Weight:  [66.2 kg] 66.2 kg (11/01 0421) Last BM Date: 03/20/20  Intake/Output from previous day: 10/31 0701 - 11/01 0700 In: 966.4 [I.V.:466.4; IV Piggyback:500] Out: -  Intake/Output this shift: No intake/output data recorded.  General appearance: alert and cooperative GI: normal findings: soft, non-tender  Lab Results:  Results for orders placed or performed during the hospital encounter of 03/21/20 (from the past 24 hour(s))  Comprehensive metabolic panel     Status: Abnormal   Collection Time: 03/21/20  8:58 PM  Result Value Ref Range   Sodium 138 135 - 145 mmol/L   Potassium 3.6 3.5 - 5.1 mmol/L   Chloride 102 98 - 111 mmol/L   CO2 24 22 - 32 mmol/L   Glucose, Bld 114 (H) 70 - 99 mg/dL   BUN 14 6 - 20 mg/dL   Creatinine, Ser 0.61 0.44 - 1.00 mg/dL   Calcium 9.4 8.9 - 10.3 mg/dL   Total Protein 7.1 6.5 - 8.1 g/dL   Albumin 4.0 3.5 - 5.0 g/dL   AST 14 (L) 15 - 41 U/L   ALT 13 0 - 44 U/L   Alkaline Phosphatase 62 38 - 126 U/L   Total Bilirubin 0.7 0.3 - 1.2 mg/dL   GFR, Estimated >60 >60 mL/min   Anion gap 12 5 - 15  CBC with Differential     Status: None   Collection Time: 03/21/20  8:58 PM  Result Value Ref Range   WBC 4.9 4.0 - 10.5 K/uL   RBC 4.53 3.87 - 5.11 MIL/uL   Hemoglobin 13.5 12.0 - 15.0 g/dL   HCT 41.0 36 - 46 %   MCV 90.5 80.0 - 100.0 fL   MCH 29.8 26.0 - 34.0 pg   MCHC 32.9 30.0 - 36.0 g/dL   RDW 13.2 11.5 - 15.5 %   Platelets 230 150 - 400 K/uL   nRBC 0.0 0.0 - 0.2 %   Neutrophils Relative % 69 %   Neutro Abs 3.4 1.7 - 7.7 K/uL   Lymphocytes Relative 20 %   Lymphs Abs 1.0 0.7 - 4.0 K/uL   Monocytes Relative 8 %    Monocytes Absolute 0.4 0.1 - 1.0 K/uL   Eosinophils Relative 2 %   Eosinophils Absolute 0.1 0.0 - 0.5 K/uL   Basophils Relative 1 %   Basophils Absolute 0.1 0.0 - 0.1 K/uL   Immature Granulocytes 0 %   Abs Immature Granulocytes 0.01 0.00 - 0.07 K/uL  Urinalysis, Routine w reflex microscopic Urine, Clean Catch     Status: Abnormal   Collection Time: 03/21/20  8:58 PM  Result Value Ref Range   Color, Urine STRAW (A) YELLOW   APPearance CLEAR CLEAR   Specific Gravity, Urine 1.008 1.005 - 1.030   pH 6.0 5.0 - 8.0   Glucose, UA NEGATIVE NEGATIVE mg/dL   Hgb urine dipstick NEGATIVE NEGATIVE   Bilirubin Urine NEGATIVE NEGATIVE   Ketones, ur NEGATIVE NEGATIVE mg/dL   Protein, ur NEGATIVE NEGATIVE mg/dL   Nitrite NEGATIVE NEGATIVE   Leukocytes,Ua NEGATIVE NEGATIVE  Lipase, blood     Status: None   Collection Time: 03/21/20  8:58  PM  Result Value Ref Range   Lipase 36 11 - 51 U/L  Respiratory Panel by RT PCR (Flu A&B, Covid) - Nasopharyngeal Swab     Status: None   Collection Time: 03/22/20  1:00 AM   Specimen: Nasopharyngeal Swab  Result Value Ref Range   SARS Coronavirus 2 by RT PCR NEGATIVE NEGATIVE   Influenza A by PCR NEGATIVE NEGATIVE   Influenza B by PCR NEGATIVE NEGATIVE  Basic metabolic panel     Status: Abnormal   Collection Time: 03/22/20  3:41 AM  Result Value Ref Range   Sodium 140 135 - 145 mmol/L   Potassium 3.9 3.5 - 5.1 mmol/L   Chloride 105 98 - 111 mmol/L   CO2 26 22 - 32 mmol/L   Glucose, Bld 130 (H) 70 - 99 mg/dL   BUN 11 6 - 20 mg/dL   Creatinine, Ser 0.62 0.44 - 1.00 mg/dL   Calcium 9.0 8.9 - 10.3 mg/dL   GFR, Estimated >60 >60 mL/min   Anion gap 9 5 - 15  CBC     Status: None   Collection Time: 03/22/20  3:41 AM  Result Value Ref Range   WBC 6.5 4.0 - 10.5 K/uL   RBC 4.41 3.87 - 5.11 MIL/uL   Hemoglobin 13.1 12.0 - 15.0 g/dL   HCT 40.5 36 - 46 %   MCV 91.8 80.0 - 100.0 fL   MCH 29.7 26.0 - 34.0 pg   MCHC 32.3 30.0 - 36.0 g/dL   RDW 13.3 11.5 - 15.5  %   Platelets 226 150 - 400 K/uL   nRBC 0.0 0.0 - 0.2 %     Studies/Results Radiology     MEDS, Scheduled . diatrizoate meglumine-sodium  90 mL Per NG tube Once  . enoxaparin (LOVENOX) injection  40 mg Subcutaneous Q24H  . erythromycin  1 application Right Eye QHS  . pantoprazole (PROTONIX) IV  40 mg Intravenous QHS  . PARoxetine  40 mg Oral Daily  . sodium chloride (PF)         Assessment: SBO  Plan: Await return of bowel function.  NG recommended but pt refused.  Will have her drink oral contrast to do small bowel protocol  LOS: 0 days    Rosario Adie, MD [facility], [place]    03/22/2020 9:42 AM

## 2020-03-22 NOTE — Plan of Care (Signed)
Plan of care reviewed and discussed with the patient. 

## 2020-03-22 NOTE — Plan of Care (Signed)
  Problem: Education: Goal: Knowledge of General Education information will improve Description Including pain rating scale, medication(s)/side effects and non-pharmacologic comfort measures Outcome: Progressing   

## 2020-03-22 NOTE — ED Notes (Signed)
Pt. Is for NGT insertion, procedure explained to patient but REFUSED NGT insertion. Pt. Educated further of the importance of the NGT but pt. Still declined. Charge Nurse Allayne Gitelman made aware. Dr. Rolm Bookbinder notified over the phone. Pt. Is alert and oriented x4.

## 2020-03-22 NOTE — Progress Notes (Signed)
Handbook given to patient/reviewed with patient.

## 2020-03-22 NOTE — H&P (Signed)
Melissa Case is an 59 y.o. female.   Chief Complaint: ab pain HPI: 89 yof with history parotidectomy/xrt for parotid cancer, hysterectomy and lap chole who presents with about 7 hours of right sided and lower abdominal pain.has had similar episode before. Not getting better at all.  Nonradiating. It is constant but worse at times. Has nausea.  Not passing flatus or bms. Underwent ct scan that showed sbo with fecalization and I was called.   Past Medical History:  Diagnosis Date  . 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 Depression   . GERD (gastroesophageal reflux disease)   . Panic attack     Past Surgical History:  Procedure Laterality Date  . ABDOMINAL HYSTERECTOMY    . CHOLECYSTECTOMY N/A 11/21/2017   Procedure: LAPAROSCOPIC CHOLECYSTECTOMY WITH INTRAOPERATIVE CHOLANGIOGRAM;  Surgeon: Jovita Kussmaul, MD;  Location: [facility];  Service: General;  Laterality: N/A;  . MULTIPLE EXTRACTIONS WITH ALVEOLOPLASTY N/A 08/28/2019   Procedure: Extraction of tooth #'s 3-7, 10-15, and 20-31 with alveoloplatsy, maxillary right buccal exostosis reduction, and bilateral mandibular tori reductions.;  Surgeon: Lenn Cal, DDS;  Location: [place];  Service: Oral Surgery;  Laterality: N/A;  . PAROTIDECTOMY Right 07/22/2019   with biopsy; done at [facility] by Dr. Fredricka Bonine    Family History  Problem Relation Age of Onset  . Depression Sister   . Heart disease Mother   . Diabetes Father   . Breast cancer Paternal Grandmother    Social History:  reports that she has been smoking cigarettes. She started smoking about 43 years ago. She has a 21.00 pack-year smoking history. She has never used smokeless tobacco. She reports that she does not drink alcohol and does not use drugs.  Allergies:  Allergies  Allergen Reactions  . Morphine Itching  . Penicillins Rash    Has patient had a PCN reaction causing immediate rash, facial/tongue/throat swelling, SOB or lightheadedness with hypotension: No Has patient had  a PCN reaction causing severe rash involving mucus membranes or skin necrosis: No Has patient had a PCN reaction that required hospitalization No Has patient had a PCN reaction occurring within the last 10 years: No If all of the above answers are "NO", then may proceed with Cephalosporin use.     meds reviewed   Results for orders placed or performed during the hospital encounter of 03/21/20 (from the past 48 hour(s))  Comprehensive metabolic panel     Status: Abnormal   Collection Time: 03/21/20  8:58 PM  Result Value Ref Range   Sodium 138 135 - 145 mmol/L   Potassium 3.6 3.5 - 5.1 mmol/L   Chloride 102 98 - 111 mmol/L   CO2 24 22 - 32 mmol/L   Glucose, Bld 114 (H) 70 - 99 mg/dL    Comment: Glucose reference range applies only to samples taken after fasting for at least 8 hours.   BUN 14 6 - 20 mg/dL   Creatinine, Ser 0.61 0.44 - 1.00 mg/dL   Calcium 9.4 8.9 - 10.3 mg/dL   Total Protein 7.1 6.5 - 8.1 g/dL   Albumin 4.0 3.5 - 5.0 g/dL   AST 14 (L) 15 - 41 U/L   ALT 13 0 - 44 U/L   Alkaline Phosphatase 62 38 - 126 U/L   Total Bilirubin 0.7 0.3 - 1.2 mg/dL   GFR, Estimated >60 >60 mL/min    Comment: (NOTE) Calculated using the CKD-EPI Creatinine Equation (2021)    Anion gap 12 5 - 15  Comment: Performed at [facility], [place] [address], [place], [place] [address]  CBC with Differential     Status: None   Collection Time: 03/21/20  8:58 PM  Result Value Ref Range   WBC 4.9 4.0 - 10.5 K/uL   RBC 4.53 3.87 - 5.11 MIL/uL   Hemoglobin 13.5 12.0 - 15.0 g/dL   HCT 41.0 36 - 46 %   MCV 90.5 80.0 - 100.0 fL   MCH 29.8 26.0 - 34.0 pg   MCHC 32.9 30.0 - 36.0 g/dL   RDW 13.2 11.5 - 15.5 %   Platelets 230 150 - 400 K/uL   nRBC 0.0 0.0 - 0.2 %   Neutrophils Relative % 69 %   Neutro Abs 3.4 1.7 - 7.7 K/uL   Lymphocytes Relative 20 %   Lymphs Abs 1.0 0.7 - 4.0 K/uL   Monocytes Relative 8 %   Monocytes Absolute 0.4 0.1 - 1.0 K/uL   Eosinophils Relative 2 %    Eosinophils Absolute 0.1 0.0 - 0.5 K/uL   Basophils Relative 1 %   Basophils Absolute 0.1 0.0 - 0.1 K/uL   Immature Granulocytes 0 %   Abs Immature Granulocytes 0.01 0.00 - 0.07 K/uL    Comment: Performed at [facility], [place] [address], [place], [place] [address]  Urinalysis, Routine w reflex microscopic Urine, Clean Catch     Status: Abnormal   Collection Time: 03/21/20  8:58 PM  Result Value Ref Range   Color, Urine STRAW (A) YELLOW   APPearance CLEAR CLEAR   Specific Gravity, Urine 1.008 1.005 - 1.030   pH 6.0 5.0 - 8.0   Glucose, UA NEGATIVE NEGATIVE mg/dL   Hgb urine dipstick NEGATIVE NEGATIVE   Bilirubin Urine NEGATIVE NEGATIVE   Ketones, ur NEGATIVE NEGATIVE mg/dL   Protein, ur NEGATIVE NEGATIVE mg/dL   Nitrite NEGATIVE NEGATIVE   Leukocytes,Ua NEGATIVE NEGATIVE    Comment: Performed at [place] [address], [place], [place] [address]  Lipase, blood     Status: None   Collection Time: 03/21/20  8:58 PM  Result Value Ref Range   Lipase 36 11 - 51 U/L    Comment: Performed at [facility], [place] [address], [place], [place] [address]   CT Abdomen Pelvis W Contrast  Result Date: 03/21/2020 CLINICAL DATA:  Right lower quadrant abdominal pain, left lower quadrant abdominal pain EXAM: CT ABDOMEN AND PELVIS WITH CONTRAST TECHNIQUE: Multidetector CT imaging of the abdomen and pelvis was performed using the standard protocol following bolus administration of intravenous contrast. CONTRAST:  199mL OMNIPAQUE IOHEXOL 300 MG/ML  SOLN COMPARISON:  02/24/2019 FINDINGS: Lower chest: Mild bibasilar atelectasis the visualized heart and pericardium are unremarkable. Hepatobiliary: Status post cholecystectomy. Liver unremarkable. No intra or extrahepatic biliary ductal dilation. Pancreas: Unremarkable Spleen: Unremarkable Adrenals/Urinary Tract: Adrenal glands are unremarkable. Kidneys are normal, without renal calculi, focal lesion, or  hydronephrosis. Bladder is unremarkable. Stomach/Bowel: There is a high-grade distal small-bowel obstruction with the point of transition seen within the right lower quadrant immediately subjacent to the abdominal wall adjacent to the inferior epigastric vasculature at axial image # 57/2 and sagittal image # 71/7. There is fecalization of intraluminal contents within the small bowel proximal to the point of obstruction secondary to stasis. Distally, the small bowel is decompressed. The large bowel is unremarkable. Appendix normal. No free intraperitoneal gas or fluid. Vascular/Lymphatic: Extensive aortoiliac atherosclerotic calcification without evidence of aneurysm. No pathologic adenopathy within the abdomen and  pelvis. Reproductive: Uterus absent. Pelvic lymph node dissection has been performed. No adnexal masses are seen. Other: Rectum unremarkable Musculoskeletal: No acute bone abnormality. IMPRESSION: High-grade distal small-bowel obstruction with the point of transition within the anterior right lower quadrant likely related to an underlying adhesion. No evidence of perforation. No ischemia. Aortic Atherosclerosis (ICD10-I70.0). Electronically Signed   By: Fidela Salisbury MD   On: 03/21/2020 23:18    Review of Systems  Constitutional: Negative for fever.  Gastrointestinal: Positive for abdominal pain and nausea. Negative for abdominal distention and vomiting.  All other systems reviewed and are negative.   Blood pressure 124/80, pulse 72, temperature 97.8 F (36.6 C), temperature source Oral, resp. rate 18, SpO2 100 %. Physical Exam Constitutional:      Appearance: She is well-developed.  HENT:     Head: Normocephalic and atraumatic.     Mouth/Throat:     Pharynx: Oropharynx is clear.  Cardiovascular:     Rate and Rhythm: Normal rate and regular rhythm.  Pulmonary:     Effort: Pulmonary effort is normal.     Breath sounds: Normal breath sounds.  Abdominal:     General: Abdomen is flat.  Bowel sounds are decreased. There is no distension.     Tenderness: There is abdominal tenderness in the right lower quadrant and periumbilical area.     Hernia: No hernia is present.  Skin:    General: Skin is warm and dry.     Capillary Refill: Capillary refill takes less than 2 seconds.  Neurological:     General: No focal deficit present.     Mental Status: She is alert.  Psychiatric:        Mood and Affect: Mood normal.        Behavior: Behavior normal.      Assessment/Plan SBO -appears she has adhesive sbo, no indication to go to OR right now -discussed ng tube placement (stomach and bowel dilated on ct although not actively vomiting) with protocol with contrast -discussed most likely to resolve conservatively with surgery reserved for worsening or not progressing Lovenox, scds   Rolm Bookbinder, MD 03/22/2020, 12:00 AM

## 2020-03-22 NOTE — ED Notes (Signed)
Tech taking patient up

## 2020-03-23 ENCOUNTER — Inpatient Hospital Stay (HOSPITAL_COMMUNITY): Payer: PRIVATE HEALTH INSURANCE

## 2020-03-23 MED ORDER — PANTOPRAZOLE SODIUM 40 MG PO TBEC
40.0000 mg | DELAYED_RELEASE_TABLET | Freq: Every day | ORAL | Status: DC
  Administered 2020-03-23: 40 mg via ORAL
  Filled 2020-03-23: qty 1

## 2020-03-23 NOTE — Progress Notes (Signed)
Patient tolerating clear liquid diet well with no complications. Followed Dr. Manon Hilding order to progress diet to full liquid. Educated patient about diet progression. Is excited to try. Call bell within reach. Will continue to monitor for symptoms of intolerance.

## 2020-03-23 NOTE — Plan of Care (Signed)
  Problem: Education: Goal: Knowledge of General Education information will improve Description: Including pain rating scale, medication(s)/side effects and non-pharmacologic comfort measures Outcome: Progressing   Problem: Clinical Measurements: Goal: Ability to maintain clinical measurements within normal limits will improve Outcome: Progressing Goal: Will remain free from infection Outcome: Progressing Goal: Diagnostic test results will improve Outcome: Progressing Goal: Respiratory complications will improve Outcome: Progressing   Problem: Nutrition: Goal: Adequate nutrition will be maintained Outcome: Progressing   Problem: Elimination: Goal: Will not experience complications related to bowel motility Outcome: Progressing   Problem: Pain Managment: Goal: General experience of comfort will improve Outcome: Progressing

## 2020-03-23 NOTE — Progress Notes (Signed)
Partial SBO  Subjective: States she feels better.  No BM yet.  Passing flatus  Objective: Vital signs in last 24 hours: Temp:  [97.9 F (36.6 C)-98.5 F (36.9 C)] 98.5 F (36.9 C) (11/02 0546) Pulse Rate:  [55-64] 64 (11/02 0546) Resp:  [17-18] 17 (11/02 0546) BP: (114-141)/(60-76) 141/73 (11/02 0546) SpO2:  [98 %-100 %] 99 % (11/02 0546) Last BM Date: 03/20/20  Intake/Output from previous day: 11/01 0701 - 11/02 0700 In: 2399.8 [I.V.:2399.8] Out: -  Intake/Output this shift: No intake/output data recorded.  General appearance: alert and cooperative GI: normal findings: soft, non-tender  Lab Results:  No results found for this or any previous visit (from the past 24 hour(s)).   Studies/Results Radiology     MEDS, Scheduled . enoxaparin (LOVENOX) injection  40 mg Subcutaneous Q24H  . erythromycin  1 application Right Eye QHS  . pantoprazole (PROTONIX) IV  40 mg Intravenous QHS  . PARoxetine  40 mg Oral Daily     Assessment: SBO  Plan: Contrast in colon on AXR this AM.  Advance diet as tolerated.  Probable d/c tom if tolerates a diet and has a BM  LOS: 1 day    Rosario Adie, MD [facility], [place]    03/23/2020 9:01 AM

## 2020-03-24 MED ORDER — OXYCODONE HCL 5 MG PO TABS: 5.0000 mg | ORAL_TABLET | ORAL | Status: DC | PRN

## 2020-03-24 MED ORDER — POLYETHYLENE GLYCOL 3350 17 G PO PACK: 17.0000 g | PACK | Freq: Every day | ORAL | 0 refills | Status: DC | PRN

## 2020-03-24 NOTE — Plan of Care (Signed)
  Problem: Education: Goal: Knowledge of General Education information will improve Description: Including pain rating scale, medication(s)/side effects and non-pharmacologic comfort measures Outcome: Progressing   Problem: Clinical Measurements: Goal: Ability to maintain clinical measurements within normal limits will improve Outcome: Progressing Goal: Will remain free from infection Outcome: Progressing Goal: Diagnostic test results will improve Outcome: Progressing Goal: Respiratory complications will improve Outcome: Progressing   Problem: Nutrition: Goal: Adequate nutrition will be maintained Outcome: Progressing   Problem: Pain Managment: Goal: General experience of comfort will improve Outcome: Progressing   

## 2020-03-24 NOTE — Progress Notes (Signed)
Subjective: CC: Doing well. Abdominal pain improved overnight and she now only reports mild lower abdominal pain. No distension/bloating. She is tolerating fld without increased abdominal pain, or associated abdominal distension, burping/belching, n/v. Passing flatus. 2 loose bm's and 2 formed bm's yesterday.   Objective: Vital signs in last 24 hours: Temp:  [97.8 F (36.6 C)-98.1 F (36.7 C)] 97.9 F (36.6 C) (11/03 9798) Pulse Rate:  [55-59] 57 (11/03 0613) Resp:  [16-20] 17 (11/03 9211) BP: (120-144)/(57-75) 134/63 (11/03 0613) SpO2:  [97 %-100 %] 100 % (11/03 9417) Last BM Date: 03/23/20  Intake/Output from previous day: 11/02 0701 - 11/03 0700 In: 3780 [P.O.:1580; I.V.:2200] Out: -  Intake/Output this shift: Total I/O In: 300 [P.O.:300] Out: -   PE: Gen:  Alert, NAD, pleasant Card:  Reg Pulm:  Normal rate and effort. On RA Abd: Soft, NT/ND, +BS Ext:  No LE edema Psych: A&Ox3  Skin: no rashes noted, warm and dry  Lab Results:  Recent Labs    03/21/20 2058 03/22/20 0341  WBC 4.9 6.5  HGB 13.5 13.1  HCT 41.0 40.5  PLT 230 226   BMET Recent Labs    03/21/20 2058 03/22/20 0341  NA 138 140  K 3.6 3.9  CL 102 105  CO2 24 26  GLUCOSE 114* 130*  BUN 14 11  CREATININE 0.61 0.62  CALCIUM 9.4 9.0   PT/INR No results for input(s): LABPROT, INR in the last 72 hours. CMP     Component Value Date/Time   NA 140 03/22/2020 0341   NA 140 07/23/2017 0820   K 3.9 03/22/2020 0341   CL 105 03/22/2020 0341   CO2 26 03/22/2020 0341   GLUCOSE 130 (H) 03/22/2020 0341   BUN 11 03/22/2020 0341   BUN 11 07/23/2017 0820   CREATININE 0.62 03/22/2020 0341   CALCIUM 9.0 03/22/2020 0341   PROT 7.1 03/21/2020 2058   PROT 7.0 07/23/2017 0820   ALBUMIN 4.0 03/21/2020 2058   ALBUMIN 4.4 07/23/2017 0820   AST 14 (L) 03/21/2020 2058   ALT 13 03/21/2020 2058   ALKPHOS 62 03/21/2020 2058   BILITOT 0.7 03/21/2020 2058   BILITOT 0.2 07/23/2017 0820   GFRNONAA >60  03/22/2020 0341   GFRAA >60 08/26/2019 0830   Lipase     Component Value Date/Time   LIPASE 36 03/21/2020 2058       Studies/Results: DG Abd Portable 1V-Small Bowel Obstruction Protocol-24 hr delay  Result Date: 03/23/2020 CLINICAL DATA:  Small-bowel obstruction, delayed image at 24 hours EXAM: PORTABLE ABDOMEN - 1 VIEW COMPARISON:  Portable exam 1233 hours compared to 03/22/2020 FINDINGS: Oral contrast now present throughout the colon to distal sigmoid colon and rectum. Small bowel gas pattern normal. Bones demineralized. Surgical clips RIGHT upper quadrant likely cholecystectomy. IMPRESSION: GI contrast has proceeded through bowel to the distal sigmoid colon and rectum. Electronically Signed   By: Lavonia Dana M.D.   On: 03/23/2020 14:30   DG Abd Portable 1V-Small Bowel Obstruction Protocol-initial, 8 hr delay  Result Date: 03/22/2020 CLINICAL DATA:  Small-bowel obstruction EXAM: PORTABLE ABDOMEN - 1 VIEW COMPARISON:  03/21/2020 FINDINGS: Supine frontal view of the abdomen and pelvis was obtained 8 hours after oral contrast administration. Oral contrast is seen throughout the colon. Nonspecific fluid-filled loops of small bowel are again identified. Surgical clips are seen throughout the right hemiabdomen. IMPRESSION: 1. Transit of oral contrast throughout the colon, excluding high-grade obstruction. Residual nonspecific fluid-filled loops of small bowel. Electronically  Signed   By: Randa Ngo M.D.   On: 03/22/2020 20:16    Anti-infectives: Anti-infectives (From admission, onward)   None       Assessment/Plan SBO - Resolving radiographically and clinically.  - Adv to soft diet. If tolerates, possible PM d/c - Mobilize, pulm toilet   FEN - Soft diet, d/c IVF VTE - SCDs, Lovenox  ID - None    LOS: 2 days    Jillyn Ledger , [facility] 03/24/2020, 9:47 AM Please see Amion for pager number during day hours 7:00am-4:30pm

## 2020-03-24 NOTE — Plan of Care (Signed)
Care plan reviewed and discussed with the patient.

## 2020-03-24 NOTE — Discharge Summary (Signed)
    Patient ID: Melissa Case [id number] 09-Oct-1960 59 y.o.  Admit date: 03/21/2020 Discharge date: 03/24/2020  Admitting Diagnosis: SBO  Discharge Diagnosis Patient Active Problem List   Diagnosis Date Noted  . 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 Acute calculous cholecystitis 11/21/2017  . Abdominal pain 09/30/2013  . SBO (small bowel obstruction) ([place]) 09/30/2013  . 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 Gall stone 04/05/2012  . Depression 09/15/2011    Consultants None   Procedures None  Hospital Course: 59 yof with history parotidectomy/xrt for parotid cancer, hysterectomy and lap chole who presented on 03/21/20 to the ED with about 7 hours of right sided and lower abdominal pain with associated nausea. She underwent a ct scan that showed sbo with fecalization. Patient was admitted to the general surgery service, made NPO and started on SBO protocol. Contrast was noted in the colon on AM films 03/22/20. Patients diet was advanced and tolerated. Her bowel function returned. On 03/24/20 the patient was tolerating a diet with good bowel function and felt to be stable for discharge home.    Allergies as of 03/24/2020      Reactions   Morphine Itching   Penicillins Rash   Has patient had a PCN reaction causing immediate rash, facial/tongue/throat swelling, SOB or lightheadedness with hypotension: No Has patient had a PCN reaction causing severe rash involving mucus membranes or skin necrosis: No Has patient had a PCN reaction that required hospitalization No Has patient had a PCN reaction occurring within the last 10 years: No If all of the above answers are "NO", then may proceed with Cephalosporin use.      Medication List    TAKE these medications   acetaminophen 500 MG tablet Commonly known as: TYLENOL Take 1,000 mg by mouth every 6 (six) hours as needed for mild pain.   carboxymethylcellulose 0.5 % Soln Commonly known as: REFRESH PLUS Place 1 drop into the right  eye daily as needed (dry eye).   erythromycin ophthalmic ointment Place 1 application into the right eye in the morning and at bedtime.   esomeprazole 40 MG capsule Commonly known as: NEXIUM Take 40 mg by mouth daily.   PARoxetine 40 MG tablet Commonly known as: PAXIL TAKE 1 TABLET BY MOUTH (40MG  TOTAL) EVERY MORNING What changed:   how much to take  how to take this  when to take this  additional instructions   polyethylene glycol 17 g packet Commonly known as: MIRALAX / GLYCOLAX Take 17 g by mouth daily as needed for mild constipation.   traZODone 50 MG tablet Commonly known as: DESYREL Take 1/2 to one tab as needed for insomnia. What changed:   how much to take  how to take this  when to take this  reasons to take this  additional instructions         Follow-up Information    Smothers, Andree Elk, NP Follow up.   Specialty: Nurse Practitioner Contact information: [address] [place]  [address] [phone]               Signed: Alferd Apa, [facility] 03/24/2020, 2:22 PM Please see Amion for pager number during day hours 7:00am-4:30pm

## 2020-03-24 NOTE — Discharge Instructions (Signed)
Soft-Food Eating Plan Please follow this eating plan for the next 2 weeks. After this, you can return to your normal diet or a high fiber diet as outlined below.  A soft-food eating plan includes foods that are safe and easy to chew and swallow. Your health care provider or dietitian can help you find foods and flavors that fit into this plan. Follow this plan until your health care provider or dietitian says it is safe to start eating other foods and food textures. What are tips for following this plan? General guidelines   Take small bites of food, or cut food into pieces about  inch or smaller. Bite-sized pieces of food are easier to chew and swallow.  Eat moist foods. Avoid overly dry foods.  Avoid foods that: ? Are difficult to swallow, such as dry, chunky, crispy, or sticky foods. ? Are difficult to chew, such as hard, tough, or stringy foods. ? Contain nuts, seeds, or fruits.  Follow instructions from your dietitian about the types of liquids that are safe for you to swallow. You may be allowed to have: ? Thick liquids only. This includes only liquids that are thicker than honey. ? Thin and thick liquids. This includes all beverages and foods that become liquid at room temperature.  To make thick liquids: ? Purchase a commercial liquid thickening powder. These are available at grocery stores and pharmacies. ? Mix the thickener into liquids according to instructions on the label. ? Purchase ready-made thickened liquids. ? Thicken soup by pureeing, straining to remove chunks, and adding flour, potato flakes, or corn starch. ? Add commercial thickener to foods that become liquid at room temperature, such as milk shakes, yogurt, ice cream, gelatin, and sherbet.  Ask your health care provider whether you need to take a fiber supplement. Cooking  Cook meats so they stay tender and moist. Use methods like braising, stewing, or baking in liquid.  Cook vegetables and fruit until they  are soft enough to be mashed with a fork.  Peel soft, fresh fruits such as peaches, nectarines, and melons.  When making soup, make sure chunks of meat and vegetables are smaller than  inch.  Reheat leftover foods slowly so that a tough crust does not form. What foods are allowed? The items listed below may not be a complete list. Talk with your dietitian about what dietary choices are best for you. Grains Breads, muffins, pancakes, or waffles moistened with syrup, jelly, or butter. Dry cereals well-moistened with milk. Moist, cooked cereals. Well-cooked pasta and rice. Vegetables All soft-cooked vegetables. Shredded lettuce. Fruits All canned and cooked fruits. Soft, peeled fresh fruits. Strawberries. Dairy Milk. Cream. Yogurt. Cottage cheese. Soft cheese without the rind. Meats and other protein foods Tender, moist ground meat, poultry, or fish. Meat cooked in gravy or sauces. Eggs. Sweets and desserts Ice cream. Milk shakes. Sherbet. Pudding. Fats and oils Butter. Margarine. Olive, canola, sunflower, and grapeseed oil. Smooth salad dressing. Smooth cream cheese. Mayonnaise. Gravy. What foods are not allowed? The items listed bemay not be a complete list. Talk with your dietitian about what dietary choices are best for you. Grains Coarse or dry cereals, such as bran, granola, and shredded wheat. Tough or chewy crusty breads, such as [place] bread or baguettes. Breads with nuts, seeds, or fruit. Vegetables All raw vegetables. Cooked corn. Cooked vegetables that are tough or stringy. Tough, crisp, fried potatoes and potato skins. Fruits Fresh fruits with skins or seeds, or both, such as apples, pears, and grapes.  Stringy, high-pulp fruits, such as papaya, pineapple, coconut, and mango. Fruit leather and all dried fruit. Dairy Yogurt with nuts or coconut. Meats and other protein foods Hard, dry sausages. Dry meat, poultry, or fish. Meats with gristle. Fish with bones. Fried meat or  fish. Lunch meat and hotdogs. Nuts and seeds. Chunky peanut butter or other nut butters. Sweets and desserts Cakes or cookies that are very dry or chewy. Desserts with dried fruit, nuts, or coconut. Fried pastries. Very rich pastries. Fats and oils Cream cheese with fruit or nuts. Salad dressings with seeds or chunks. Summary  A soft-food eating plan includes foods that are safe and easy to swallow. Generally, the foods should be soft enough to be mashed with a fork.  Avoid foods that are dry, hard to chew, crunchy, sticky, stringy, or crispy.  Ask your health care provider whether you need to thicken your liquids and if you need to take a fiber supplement. This information is not intended to replace advice given to you by your health care provider. Make sure you discuss any questions you have with your health care provider. Document Revised: 08/29/2018 Document Reviewed: 07/11/2016 Elsevier Patient Education  [place] can start this diet starting 04/07/2020 Fiber, also called dietary fiber, is a type of carbohydrate that is found in fruits, vegetables, whole grains, and beans. A high-fiber diet can have many health benefits. Your health care provider may recommend a high-fiber diet to help:  Prevent constipation. Fiber can make your bowel movements more regular.  Lower your cholesterol.  Relieve the following conditions: ? Swelling of veins in the anus (hemorrhoids). ? Swelling and irritation (inflammation) of specific areas of the digestive tract (uncomplicated diverticulosis). ? A problem of the large intestine (colon) that sometimes causes pain and diarrhea (irritable bowel syndrome, IBS).  Prevent overeating as part of a weight-loss plan.  Prevent heart disease, type 2 diabetes, and certain cancers. What is my plan? The recommended daily fiber intake in grams (g) includes:  38 g for men age 50 or younger.  30 g for men over age 56.  91 g  for women age 55 or younger.  21 g for women over age 36. You can get the recommended daily intake of dietary fiber by:  Eating a variety of fruits, vegetables, grains, and beans.  Taking a fiber supplement, if it is not possible to get enough fiber through your diet. What do I need to know about a high-fiber diet?  It is better to get fiber through food sources rather than from fiber supplements. There is not a lot of research about how effective supplements are.  Always check the fiber content on the nutrition facts label of any prepackaged food. Look for foods that contain 5 g of fiber or more per serving.  Talk with a diet and nutrition specialist (dietitian) if you have questions about specific foods that are recommended or not recommended for your medical condition, especially if those foods are not listed below.  Gradually increase how much fiber you consume. If you increase your intake of dietary fiber too quickly, you may have bloating, cramping, or gas.  Drink plenty of water. Water helps you to digest fiber. What are tips for following this plan?  Eat a wide variety of high-fiber foods.  Make sure that half of the grains that you eat each day are whole grains.  Eat breads and cereals that are made with whole-grain flour instead of refined  flour or white flour.  Eat brown rice, bulgur wheat, or millet instead of white rice.  Start the day with a breakfast that is high in fiber, such as a cereal that contains 5 g of fiber or more per serving.  Use beans in place of meat in soups, salads, and pasta dishes.  Eat high-fiber snacks, such as berries, raw vegetables, nuts, and popcorn.  Choose whole fruits and vegetables instead of processed forms like juice or sauce. What foods can I eat?  Fruits Berries. Pears. Apples. Oranges. Avocado. Prunes and raisins. Dried figs. Vegetables Sweet potatoes. Spinach. Kale. Artichokes. Cabbage. Broccoli. Cauliflower. Green peas.  Carrots. Squash. Grains Whole-grain breads. Multigrain cereal. Oats and oatmeal. Brown rice. Barley. Bulgur wheat. [place]. Quinoa. Bran muffins. Popcorn. Rye wafer crackers. Meats and other proteins [place], kidney, and pinto beans. Soybeans. Split peas. Lentils. Nuts and seeds. Dairy Fiber-fortified yogurt. Beverages Fiber-fortified soy milk. Fiber-fortified orange juice. Other foods Fiber bars. The items listed above may not be a complete list of recommended foods and beverages. Contact a dietitian for more options. What foods are not recommended? Fruits Fruit juice. Cooked, strained fruit. Vegetables Fried potatoes. Canned vegetables. Well-cooked vegetables. Grains White bread. Pasta made with refined flour. White rice. Meats and other proteins Fatty cuts of meat. Fried chicken or fried fish. Dairy Milk. Yogurt. Cream cheese. Sour cream. Fats and oils Butters. Beverages Soft drinks. Other foods Cakes and pastries. The items listed above may not be a complete list of foods and beverages to avoid. Contact a dietitian for more information. Summary  Fiber is a type of carbohydrate. It is found in fruits, vegetables, whole grains, and beans.  There are many health benefits of eating a high-fiber diet, such as preventing constipation, lowering blood cholesterol, helping with weight loss, and reducing your risk of heart disease, diabetes, and certain cancers.  Gradually increase your intake of fiber. Increasing too fast can result in cramping, bloating, and gas. Drink plenty of water while you increase your fiber.  The best sources of fiber include whole fruits and vegetables, whole grains, nuts, seeds, and beans. This information is not intended to replace advice given to you by your health care provider. Make sure you discuss any questions you have with your health care provider. Document Revised: 03/12/2017 Document Reviewed: 03/12/2017 Elsevier Patient Education  [place].   Bowel Obstruction A bowel obstruction is a blockage in the small or large bowel. The bowel, which is also called the intestine, is a long, slender tube that connects the stomach to the anus. When a person eats and drinks, food and fluids go from the mouth to the stomach to the small bowel. This is where most of the nutrients in the food and fluids are absorbed. After the small bowel, material passes through the large bowel for further absorption until any leftover material leaves the body as stool through the anus during a bowel movement. A bowel obstruction will prevent food and fluids from passing through the bowel as they normally do during digestion. The bowel can become partially or completely blocked. If this condition is not treated, it can be dangerous because the bowel could rupture. What are the causes? Common causes of this condition include:  Scar tissue (adhesions) from previous surgery or treatment with high-energy X-rays (radiation).  Recent surgery. This may cause the movements of the bowel to slow down and cause food to block the intestine.  Inflammatory bowel disease, such as Crohn's disease or diverticulitis.  Growths  or tumors.  A bulging organ (hernia).  Twisting of the bowel (volvulus).  A foreign body.  Slipping of a part of the bowel into another part (intussusception). What are the signs or symptoms? Symptoms of this condition include:  Pain in the abdomen. Depending on the degree of obstruction, pain may be: ? Mild or severe. ? Dull cramping or sharp pain. ? In one area or in the entire abdomen.  Nausea and vomiting. Vomit may be greenish or a yellow bile color.  Bloating in the abdomen.  Difficulty passing stool (constipation).  Lack of passing gas.  Frequent belching.  Diarrhea. This may occur if the obstruction is partial and runny stool is able to leak around the obstruction. How is this diagnosed? This condition may be diagnosed based  on:  A physical exam.  Medical history.  Imaging tests of the abdomen or pelvis, such as X-ray or CT scan.  Blood or urine tests. How is this treated? Treatment for this condition depends on the cause and severity of the problem. Treatment may include:  Fluids and pain medicines that are given through an IV. Your health care provider may instruct you not to eat or drink if you have nausea or vomiting.  Eating a simple diet. You may be asked to consume a clear liquid diet for several days. This allows the bowel to rest.  Placement of a small tube (nasogastric tube) into the stomach. This will relieve pain, discomfort, and nausea by removing blocked air and fluids from the stomach. It can also help the obstruction clear up faster.  Surgery. This may be required if other treatments do not work. Surgery may be required for: ? Bowel obstruction from a hernia. This can be an emergency procedure. ? Scar tissue that causes frequent or severe obstructions. Follow these instructions at home: Medicines  Take over-the-counter and prescription medicines only as told by your health care provider.  If you were prescribed an antibiotic medicine, take it as told by your health care provider. Do not stop taking the antibiotic even if you start to feel better. General instructions  Follow instructions from your health care provider about eating restrictions. You may need to avoid solid foods and consume only clear liquids until your condition improves.  Return to your normal activities as told by your health care provider. Ask your health care provider what activities are safe for you.  Avoid sitting for a long time without moving. Get up to take short walks every 1-2 hours. This is important to improve blood flow and breathing. Ask for help if you feel weak or unsteady.  Keep all follow-up visits as told by your health care provider. This is important. How is this prevented? After having a bowel  obstruction, you are more likely to have another. You may do the following things to prevent another obstruction:  If you have a long-term (chronic) disease, pay attention to your symptoms and contact your health care provider if you have questions or concerns.  Avoid becoming constipated. To prevent or treat constipation, your health care provider may recommend that you: ? Drink enough fluid to keep your urine pale yellow. ? Take over-the-counter or prescription medicines. ? Eat foods that are high in fiber, such as beans, whole grains, and fresh fruits and vegetables. ? Limit foods that are high in fat and processed sugars, such as fried or sweet foods.  Stay active. Exercise for 30 minutes or more, 5 or more days each week. Ask your  health care provider which exercises are safe for you.  Avoid stress. Find ways to reduce stress, such as meditation, exercise, or taking time for activities that relax you.  Instead of eating three large meals each day, eat three small meals with three small snacks.  Work with a Microbiologist to make a healthy meal plan that works for you.  Do not use any products that contain nicotine or tobacco, such as cigarettes and e-cigarettes. If you need help quitting, ask your health care provider. Contact a health care provider if you:  Have a fever.  Have chills. Get help right away if you:  Have increased pain or cramping.  Vomit blood.  Have uncontrolled vomiting or nausea.  Cannot drink fluids because of vomiting or pain.  Become confused.  Begin feeling very thirsty (dehydrated).  Have severe bloating.  Feel extremely weak or you faint. Summary  A bowel obstruction is a blockage in the small or large bowel.  A bowel obstruction will prevent food and fluids from passing through the bowel as they normally do during digestion.  Treatment for this condition depends on the cause and severity of the problem. It may include fluids and pain medicines  through an IV, a simple diet, a nasogastric tube, or surgery.  Follow instructions from your health care provider about eating restrictions. You may need to avoid solid foods and consume only clear liquids until your condition improves. This information is not intended to replace advice given to you by your health care provider. Make sure you discuss any questions you have with your health care provider. Document Revised: 06/14/2018 Document Reviewed: 09/19/2017 Elsevier Patient Education  [place].

## 2020-03-24 NOTE — Progress Notes (Signed)
Agree with documented assessment. Patient is doing well with no pain, just a little tenderness in the lower  Abdominal quadrants. Is excited that she has been progressed to a soft diet today. Is continuing to ambulate well in the hallways. Call bell within reach.

## 2020-05-04 ENCOUNTER — Telehealth (INDEPENDENT_AMBULATORY_CARE_PROVIDER_SITE_OTHER): Payer: No Typology Code available for payment source | Admitting: Psychiatry

## 2020-05-04 ENCOUNTER — Other Ambulatory Visit: Payer: Self-pay

## 2020-05-04 ENCOUNTER — Encounter (HOSPITAL_COMMUNITY): Payer: Self-pay | Admitting: Psychiatry

## 2020-05-04 DIAGNOSIS — F419 Anxiety disorder, unspecified: Secondary | ICD-10-CM | POA: Diagnosis not present

## 2020-05-04 DIAGNOSIS — F330 Major depressive disorder, recurrent, mild: Secondary | ICD-10-CM

## 2020-05-04 MED ORDER — PAROXETINE HCL 40 MG PO TABS: 40.0000 mg | ORAL_TABLET | Freq: Every day | ORAL | 1 refills | Status: DC

## 2020-05-04 NOTE — Progress Notes (Signed)
Virtual Visit via Telephone Note  I connected with Melissa Case on 05/04/20 at  8:40 AM EST by telephone and verified that I am speaking with the correct person using two identifiers.  Location: Patient: Home Provider: Home Office   I discussed the limitations, risks, security and privacy concerns of performing an evaluation and management service by telephone and the availability of in person appointments. I also discussed with the patient that there may be a patient responsible charge related to this service. The patient expressed understanding and agreed to proceed.   History of Present Illness: Patient is evaluated by phone session.  She is on the phone by herself.  Recently she was admitted for abdominal pain and diagnosed with small bowel obstruction but did not require any surgery and she was discharged from the hospital after feeling better.  Overall she feels her anxiety is under control.  She denies any panic attack, nervousness, crying spells.  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  Her weight is stable.  She is sad because this is the first Christmas after she lost her father.  Patient has no plan to travel anywhere.  Her daughter came on Thanksgiving from [place].  Patient is working full-time and she tried to keep herself busy.  She denies any suicidal thoughts, feeling of hopelessness or worthlessness.  She lives with her husband who is very supportive.  She has no tremors shakes or any EPS.  She had stopped taking trazodone as she does not want to take medicine that cause constipation.  She reported her sleep is okay and she is able to get 6 to 7 hours sleep every night.  Past Psychiatric History: H/O anxiety and depression. Noh/o inpatient treatment, suicidal attempt, or psychosis. Trazodone worked.   Recent Results (from the past 2160 hour(s))  Comprehensive metabolic panel     Status: Abnormal   Collection Time: 03/21/20  8:58 PM   Result Value Ref Range   Sodium 138 135 - 145 mmol/L   Potassium 3.6 3.5 - 5.1 mmol/L   Chloride 102 98 - 111 mmol/L   CO2 24 22 - 32 mmol/L   Glucose, Bld 114 (H) 70 - 99 mg/dL    Comment: Glucose reference range applies only to samples taken after fasting for at least 8 hours.   BUN 14 6 - 20 mg/dL   Creatinine, Ser 0.61 0.44 - 1.00 mg/dL   Calcium 9.4 8.9 - 10.3 mg/dL   Total Protein 7.1 6.5 - 8.1 g/dL   Albumin 4.0 3.5 - 5.0 g/dL   AST 14 (L) 15 - 41 U/L   ALT 13 0 - 44 U/L   Alkaline Phosphatase 62 38 - 126 U/L   Total Bilirubin 0.7 0.3 - 1.2 mg/dL   GFR, Estimated >60 >60 mL/min    Comment: (NOTE) Calculated using the CKD-EPI Creatinine Equation (2021)    Anion gap 12 5 - 15    Comment: Performed at [facility], [place] [address], [place], [place] [address]  CBC with Differential     Status: None   Collection Time: 03/21/20  8:58 PM  Result Value Ref Range   WBC 4.9 4.0 - 10.5 K/uL   RBC 4.53 3.87 - 5.11 MIL/uL   Hemoglobin 13.5 12.0 - 15.0 g/dL   HCT 41.0 36.0 - 46.0 %   MCV 90.5 80.0 - 100.0 fL   MCH 29.8 26.0 - 34.0 pg   MCHC 32.9 30.0 -  36.0 g/dL   RDW 13.2 11.5 - 15.5 %   Platelets 230 150 - 400 K/uL   nRBC 0.0 0.0 - 0.2 %   Neutrophils Relative % 69 %   Neutro Abs 3.4 1.7 - 7.7 K/uL   Lymphocytes Relative 20 %   Lymphs Abs 1.0 0.7 - 4.0 K/uL   Monocytes Relative 8 %   Monocytes Absolute 0.4 0.1 - 1.0 K/uL   Eosinophils Relative 2 %   Eosinophils Absolute 0.1 0.0 - 0.5 K/uL   Basophils Relative 1 %   Basophils Absolute 0.1 0.0 - 0.1 K/uL   Immature Granulocytes 0 %   Abs Immature Granulocytes 0.01 0.00 - 0.07 K/uL    Comment: Performed at [facility], [place] [address], [place], [place] [address]  Urinalysis, Routine w reflex microscopic Urine, Clean Catch     Status: Abnormal   Collection Time: 03/21/20  8:58 PM  Result Value Ref Range   Color, Urine STRAW (A) YELLOW   APPearance CLEAR CLEAR   Specific Gravity, Urine  1.008 1.005 - 1.030   pH 6.0 5.0 - 8.0   Glucose, UA NEGATIVE NEGATIVE mg/dL   Hgb urine dipstick NEGATIVE NEGATIVE   Bilirubin Urine NEGATIVE NEGATIVE   Ketones, ur NEGATIVE NEGATIVE mg/dL   Protein, ur NEGATIVE NEGATIVE mg/dL   Nitrite NEGATIVE NEGATIVE   Leukocytes,Ua NEGATIVE NEGATIVE    Comment: Performed at [place] [address] [place], [place] [address]  Urine culture     Status: Abnormal   Collection Time: 03/21/20  8:58 PM   Specimen: Urine, Clean Catch  Result Value Ref Range   Specimen Description      URINE, CLEAN CATCH Performed at [facility], [place] [address] [place], [place] [address]    Special Requests      NONE Performed at [facility], [place] [address], [place], [place] [address]    Culture MULTIPLE SPECIES PRESENT, SUGGEST RECOLLECTION (A)    Report Status 03/22/2020 FINAL   Lipase, blood     Status: None   Collection Time: 03/21/20  8:58 PM  Result Value Ref Range   Lipase 36 11 - 51 U/L    Comment: Performed at [facility], [place] [address], [place], [place] [address]  Respiratory Panel by RT PCR (Flu A&B, Covid) - Nasopharyngeal Swab     Status: None   Collection Time: 03/22/20  1:00 AM   Specimen: Nasopharyngeal Swab  Result Value Ref Range   SARS Coronavirus 2 by RT PCR NEGATIVE NEGATIVE    Comment: (NOTE) SARS-CoV-2 target nucleic acids are NOT DETECTED.  The SARS-CoV-2 RNA is generally detectable in upper respiratoy specimens during the acute phase of infection. The lowest concentration of SARS-CoV-2 viral copies this assay can detect is 131 copies/mL. A negative result does not preclude SARS-Cov-2 infection and should not be used as the sole basis for treatment or other patient management decisions. A negative result may occur with  improper specimen collection/handling, submission of specimen other than nasopharyngeal swab, presence of viral mutation(s) within  the areas targeted by this assay, and inadequate number of viral copies (<131 copies/mL). A negative result must be combined with clinical observations, patient history, and epidemiological information. The expected result is Negative.  Fact Sheet for Patients:  PinkCheek.be  Fact Sheet for Healthcare Providers:  GravelBags.it  This test is no t yet approved or cleared by the [place] and  has been authorized for  detection and/or diagnosis of SARS-CoV-2 by FDA under an Emergency Use Authorization (EUA). This EUA will remain  in effect (meaning this test can be used) for the duration of the COVID-19 declaration under Section 564(b)(1) of the Act, 21 U.S.C. section 360bbb-3(b)(1), unless the authorization is terminated or revoked sooner.     Influenza A by PCR NEGATIVE NEGATIVE   Influenza B by PCR NEGATIVE NEGATIVE    Comment: (NOTE) The Xpert Xpress SARS-CoV-2/FLU/RSV assay is intended as an aid in  the diagnosis of influenza from Nasopharyngeal swab specimens and  should not be used as a sole basis for treatment. Nasal washings and  aspirates are unacceptable for Xpert Xpress SARS-CoV-2/FLU/RSV  testing.  Fact Sheet for Patients: PinkCheek.be  Fact Sheet for Healthcare Providers: GravelBags.it  This test is not yet approved or cleared by the [place] FDA and  has been authorized for detection and/or diagnosis of SARS-CoV-2 by  FDA under an Emergency Use Authorization (EUA). This EUA will remain  in effect (meaning this test can be used) for the duration of the  Covid-19 declaration under Section 564(b)(1) of the Act, 21  U.S.C. section 360bbb-3(b)(1), unless the authorization is  terminated or revoked. Performed at [facility], [place] [address] [place], [place] [address]   Basic metabolic panel     Status: Abnormal    Collection Time: 03/22/20  3:41 AM  Result Value Ref Range   Sodium 140 135 - 145 mmol/L   Potassium 3.9 3.5 - 5.1 mmol/L   Chloride 105 98 - 111 mmol/L   CO2 26 22 - 32 mmol/L   Glucose, Bld 130 (H) 70 - 99 mg/dL    Comment: Glucose reference range applies only to samples taken after fasting for at least 8 hours.   BUN 11 6 - 20 mg/dL   Creatinine, Ser 0.62 0.44 - 1.00 mg/dL   Calcium 9.0 8.9 - 10.3 mg/dL   GFR, Estimated >60 >60 mL/min    Comment: (NOTE) Calculated using the CKD-EPI Creatinine Equation (2021)    Anion gap 9 5 - 15    Comment: Performed at [facility], [place] [address] [place], [place] [address]  CBC     Status: None   Collection Time: 03/22/20  3:41 AM  Result Value Ref Range   WBC 6.5 4.0 - 10.5 K/uL   RBC 4.41 3.87 - 5.11 MIL/uL   Hemoglobin 13.1 12.0 - 15.0 g/dL   HCT 40.5 36.0 - 46.0 %   MCV 91.8 80.0 - 100.0 fL   MCH 29.7 26.0 - 34.0 pg   MCHC 32.3 30.0 - 36.0 g/dL   RDW 13.3 11.5 - 15.5 %   Platelets 226 150 - 400 K/uL   nRBC 0.0 0.0 - 0.2 %    Comment: Performed at [facility], [place] [address] [place],  [address]     Psychiatric Specialty Exam: Physical Exam  Review of Systems  Weight 143 lb (64.9 kg).There is no height or weight on file to calculate BMI.  General Appearance: NA  Eye Contact:  NA  Speech:  Slow  Volume:  Normal  Mood:  Dysphoric  Affect:  NA  Thought Process:  Goal Directed  Orientation:  Full (Time, Place, and Person)  Thought Content:  Logical  Suicidal Thoughts:  No  Homicidal Thoughts:  No  Memory:  Immediate;   Good Recent;   Good Remote;   Good  Judgement:  Intact  Insight:  Present  Psychomotor Activity:  NA  Concentration:  Concentration: Fair and Attention Span: Fair  Recall:  Good  Fund of Knowledge:  Good  Language:  Good  Akathisia:  No  Handed:  Right  AIMS (if indicated):     Assets:  Communication Skills Desire for Improvement Housing Social  Support Transportation  ADL's:  Intact  Cognition:  WNL  Sleep:   ok      Assessment and Plan: Major depressive disorder, recurrent.  Anxiety.  I reviewed blood work results, current medication.  She is doing better on Paxil 40 mg.  She is not taking trazodone.  She is not interested in therapy.  She like to keep the 40 mg Paxil.  Discussed medication side effects and benefits.  Continue Paxil 40 mg daily.  She does not need trazodone at this time.  Recommended to call us back if is any question or any concern.  Follow-up in 6 months.  Follow Up Instructions:    I discussed the assessment and treatment plan with the patient. The patient was provided an opportunity to ask questions and all were answered. The patient agreed with the plan and demonstrated an understanding of the instructions.   The patient was advised to call back or seek an in-person evaluation if the symptoms worsen or if the condition fails to improve as anticipated.  I provided 14 minutes of non-face-to-face time during this encounter.   Kathlee Nations, MD

## 2020-05-12 ENCOUNTER — Ambulatory Visit: Payer: Self-pay | Admitting: Radiation Oncology

## 2020-05-12 ENCOUNTER — Telehealth: Payer: Self-pay | Admitting: *Deleted

## 2020-05-12 NOTE — Telephone Encounter (Signed)
CALLED PATIENT TO ASK ABOUT RESCHEDULING MISSED FU FOR 05-11-20, LVM FOR A RETURN CALL

## 2020-05-28 ENCOUNTER — Ambulatory Visit (INDEPENDENT_AMBULATORY_CARE_PROVIDER_SITE_OTHER): Payer: No Typology Code available for payment source | Admitting: Gastroenterology

## 2020-05-28 ENCOUNTER — Encounter: Payer: Self-pay | Admitting: Gastroenterology

## 2020-05-28 VITALS — BP 120/70 | HR 74 | Ht 64.0 in | Wt 151.0 lb

## 2020-05-28 DIAGNOSIS — K565 Intestinal adhesions [bands], unspecified as to partial versus complete obstruction: Secondary | ICD-10-CM

## 2020-05-28 DIAGNOSIS — K219 Gastro-esophageal reflux disease without esophagitis: Secondary | ICD-10-CM

## 2020-05-28 DIAGNOSIS — F3289 Other specified depressive episodes: Secondary | ICD-10-CM | POA: Diagnosis not present

## 2020-05-28 DIAGNOSIS — K2270 Barrett's esophagus without dysplasia: Secondary | ICD-10-CM

## 2020-05-28 MED ORDER — ESOMEPRAZOLE MAGNESIUM 40 MG PO CPDR: 40.0000 mg | DELAYED_RELEASE_CAPSULE | Freq: Every day | ORAL | 3 refills | Status: DC

## 2020-05-28 NOTE — Patient Instructions (Addendum)
It was a pleasure to meet you today.   I have recommended that you continue to take your Nexium, a proton pump inhibitor (PPI) for treatment every day. PPIs work by inactivating a specific enzyme responsible for the final step of acid release in the stomach.  PRESCRIPTION MEDICATION(S): We have sent the following medication(s) to your pharmacy:  . Nexium - Please take 40mg  by mouth every day  In general, PPIs are considered safe medicines. But they may make the gastrointestinal tract more susceptible to bacterial infections-including a serious diarrheal disease called Clostridium difficile-and may increase the long-term risk of hip fractures and memory problems over time. Despite these concerns, PPIs are the preferred medication for treating reflux and Barrett's Esophagus.  I will obtain the records from Dr. Therisa Doyne and Dr. Vira Agar to determine when you will need to have another upper endoscopy and colonoscopy.   I have referred you to establish care with a primary care provider.  REFERRAL TO [facility] will receive a call from their office regarding the date, time and location of your appointment.  I would like to see you at least annually, and more often if you are having any problems.   If you are age 25 or younger, your body mass index should be between 19-25. Your Body mass index is 25.92 kg/m. If this is out of the aformentioned range listed, please consider follow up with your Primary Care Provider.   Thank you for trusting me with your gastrointestinal care!    Thornton Park, MD, MPH

## 2020-05-28 NOTE — Progress Notes (Addendum)
Referring Provider: No ref. provider found Primary Care Physician:  Patient, No Pcp Per  Reason for Consultation:  Barrett's Esophagus, needs for reflux   IMPRESSION:  History of short segment Barrett's esophagus diagnosed by Dr. Vira Agar 2013    - ? Additional endoscopy with Dr. 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 Therisa Doyne - may have had a more recent colonoscopy No known family history of colon cancer or polyps or other primary GI malignancies  Addendum 06/08/2020: I reviewed 35 pages of records from the [facility].  Most recent visit was in 2010.  Diagnoses include (1) gastroesophageal reflux disease with Barrett's esophagus diagnosed on EGD 2007,  EGD 2010 with short segment Barrett's without dysplasia.  Z-line at 35 cm from the incisors.  EGD 2013 showed 1 cm Barrett's.  (2) Intermittent right-sided chest pain that could be GERD related. (3) H pylori negative gastritis on EGD 2010 and 2013. (4) screening colonoscopy 05/07/2012 showed internal hemorrhoids  PLAN: - Continue Nexium 40 mg daily (90 day with 3 refills) - Reviewed reflux lifestyle modifications - Reviewed long-term risks of PPI therapy - Obtain records from Dr. Therisa Doyne at [facility] to determine surveillance intervals for EGD and colonoscopy - Follow-up at least annually, earlier if needed  Please see the "Patient Instructions" section for addition details about the plan.  HPI: Melissa Case is a 60 y.o. female self-referred to  establish GI care.  Previously followed by Dr. Vira Agar and more recently by a physician at [facility] but her insurance changed.  Metastatic salivary ductal carcinoma status post surgery on 07/22/2019 and postoperative radiation therapy with ongoing facial nerve paralysis, cholecystectomy 2019,  anxiety, and depression.  She has required hospitalization for small bowel obstruction not requiring surgery in 2015 and again recently 03/2020. She had similar symptoms in 2020 that did not require hospitalization.   She carries a diagnosis of short-segment Barrett's esophagus as diagnosed by Dr. Vira Agar 2013. Last EGD with [facility] in 2018. She remembers being told that she didn't need to do this again for 5 years.  She has been on multiple PPIs over the years, but finds that Nexium works best.  Reflux is well controlled on Nexium.  Requesting a refill today.  Uses TUMS for breakthrough symptoms. No anorexia, unexplained weight loss, dysphagia, odynophagia, persistent vomiting, or gastrointestinal cancer in a first-degree relative.   Data reviewed today includes: CT abdomen pelvis with contrast 03/21/2020: High-grade small bowel obstruction with point of transition in the anterior right lower quadrant likely related to adhesions.  No perforation.  No ischemia. Abdominal x-ray 03/22/2020: Transit of oral contrast throughout the colon excluding high-grade obstruction, residual nonspecific fluid-filled loops of small bowel Abdominal x-ray 03/23/2020: Contrast has proceeded through the bowel to the distal sigmoid colon and rectum  Prior endoscopic evaluation: Colonoscopy 2013 showed internal hemorrhoids.  Patient reports additional endoscopy with Dr. Therisa Doyne (results are not available)   Father with peptic ulcer disease and a history of  gastrectomy.  No known family history of colon cancer or polyps. No family history of uterine/endometrial cancer, pancreatic cancer or gastric/stomach cancer.   Past Medical  History:  Diagnosis Date  . 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 Depression   . GERD (gastroesophageal reflux disease)   . Panic attack     Past Surgical History:  Procedure Laterality Date  . ABDOMINAL HYSTERECTOMY    . CHOLECYSTECTOMY N/A 11/21/2017   Procedure: LAPAROSCOPIC CHOLECYSTECTOMY WITH INTRAOPERATIVE CHOLANGIOGRAM;  Surgeon: Jovita Kussmaul, MD;  Location: [facility];  Service: General;  Laterality: N/A;  . MULTIPLE EXTRACTIONS WITH ALVEOLOPLASTY N/A 08/28/2019   Procedure: Extraction of tooth #'s 3-7, 10-15, and 20-31 with alveoloplatsy, maxillary right buccal exostosis reduction, and bilateral mandibular tori reductions.;  Surgeon: Lenn Cal, DDS;  Location: [facility];  Service: Oral Surgery;  Laterality: N/A;  . PAROTIDECTOMY Right 07/22/2019   with biopsy; done at [facility] by Dr. Fredricka Bonine    Current Outpatient Medications  Medication Sig Dispense Refill  . acetaminophen (TYLENOL) 500 MG tablet Take 1,000 mg by mouth every 6 (six) hours as needed for mild pain.    . carboxymethylcellulose (REFRESH PLUS) 0.5 % SOLN Place 1 drop into the right eye daily as needed (dry eye).     Marland Kitchen docusate sodium (COLACE) 100 MG capsule Take 100 mg by mouth daily as needed for mild constipation (uses once in a while).    Marland Kitchen esomeprazole (NEXIUM) 40 MG capsule Take 40 mg by mouth daily.     Marland Kitchen PARoxetine (PAXIL) 40 MG tablet Take 1 tablet (40 mg total) by mouth daily. 90 tablet 1  . traZODone (DESYREL) 50 MG tablet Take 1/2 to one tab as needed for insomnia. 20 tablet 0   No current facility-administered medications for this visit.    Allergies as of 05/28/2020 - Review Complete 05/28/2020  Allergen Reaction Noted  . Morphine Itching 07/09/2019  . Penicillins Rash 06/16/2011    Family History  Problem Relation Age of Onset  . Depression Sister   . Heart disease Mother   . Diabetes Father   . Breast cancer Paternal Grandmother   . Colon cancer Neg Hx   . Pancreatic cancer Neg Hx   .  Esophageal cancer Neg Hx   . Stomach cancer Neg Hx   . Rectal cancer Neg Hx   . Liver cancer Neg Hx     Social History   Socioeconomic History  . Marital status: Married    Spouse name: Not on file  . Number of children: 4  . Years of education: Not on file  . Highest education level: Not on file  Occupational History  . Not on file  Tobacco Use  . Smoking status: Current Every Day Smoker    Packs/day: 0.50    Years: 42.00    Pack years: 21.00    Types: Cigarettes    Start date: 62  . Smokeless tobacco: Never Used  Vaping Use  . Vaping Use: Never used  Substance and Sexual Activity  . Alcohol use: No    Alcohol/week: 0.0 standard drinks  . Drug use: No  . Sexual activity: Yes    Birth control/protection: None  Other Topics Concern  . Not on file  Social History Narrative  . Not on file   Social Determinants of Health   Financial Resource Strain: Not on file  Food Insecurity: Not on file  Transportation Needs: Not on file  Physical Activity: Not on file  Stress: Not on file  Social Connections: Not on file  Intimate Partner Violence: Not on file    Review of Systems: 12 system ROS is negative except as noted above.   Physical Exam: General:   Alert,  well-nourished, pleasant and cooperative in NAD. Right sided facial paralysis Head:  Normocephalic and atraumatic. Eyes:  Sclera clear, no icterus.   Conjunctiva pink. Ears:  Normal auditory acuity. Nose:  No deformity, discharge,  or lesions. Mouth:  No deformity or lesions.   Neck: Asymmetric with well healed incision on the left neck; no masses or thyromegaly. Lungs:  Clear throughout to auscultation.   No wheezes. Heart:  Regular rate and rhythm; no murmurs. Abdomen:  Soft, nontender, nondistended, normal bowel sounds, no rebound or guarding. No hepatosplenomegaly.   Rectal:  Deferred  Msk:  Symmetrical. No boney deformities LAD: No inguinal or umbilical LAD Extremities:  No clubbing or  edema. Neurologic:  Alert and  oriented x4;  grossly nonfocal Skin:  Intact without significant lesions or rashes. Psych:  Alert and cooperative. Normal mood and affect.     Melissa Sanguinetti L. Tarri Glenn, MD, MPH 05/28/2020, 9:33 AM

## 2020-06-02 ENCOUNTER — Telehealth: Payer: Self-pay

## 2020-06-02 NOTE — Telephone Encounter (Signed)
SECOND REQUEST  ROI - [place]: [facility] and [facility]  Document: ROI  ROI has been faxed successfully to BOTH offices listed above. Documents and fax confirmations have been placed in the faxed file for future reference.

## 2020-06-08 ENCOUNTER — Telehealth: Payer: Self-pay | Admitting: Gastroenterology

## 2020-06-08 NOTE — Telephone Encounter (Signed)
Received records from [facility]. Records have been labeled and sent to HIM for scanning purposes. Still awaiting records from [facility] [place]

## 2020-06-08 NOTE — Telephone Encounter (Signed)
I reviewed 35 pages of records from the [facility].  Most recent visit was in 2010.  Diagnoses include (1) gastroesophageal reflux disease with Barrett's esophagus diagnosed on EGD 2007,  EGD 2010 with short segment Barrett's without dysplasia.  Z-line at 35 cm from the incisors.  EGD 2013 showed 1 cm Barrett's.  (2) Intermittent right-sided chest pain that could be GERD related. (3) H pylori negative gastritis on EGD 2010 and 2013.  Still awaiting records from [facility], where she was seen more recently.

## 2020-06-09 NOTE — Telephone Encounter (Signed)
Received records from [facility] sending them to you for review.

## 2020-06-09 NOTE — Telephone Encounter (Signed)
Followed up on status of records from [facility]. Unfortunately, records never received after multiple attempts to obtain.

## 2020-06-10 NOTE — Telephone Encounter (Signed)
LVM requesting returned call 

## 2020-06-10 NOTE — Telephone Encounter (Signed)
Records from [facility] and [facility] reviewed.  Consider EGD to follow-up on Barrett's in 2023. Surveillance colonoscopy due 2023.  Please notify the patient of my recommendations and implement surveillance for procedures at that time.  Thank you.

## 2020-06-10 NOTE — Telephone Encounter (Signed)
I reviewed 15 pages of records from [facility].  The patient has been followed by Dr. Therisa Doyne for reflux, last seen 12/02/2018.  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  CT from 2015 showed cholelithiasis with no acute abnormality.  She had an unremarkable EGD 11/18 without evidence of Barrett's esophagus.  Z-line was located 35 cm from the incisors.  Esophageal biopsies showed reflux.  There was no eosinophilic esophagitis.  Gastric biopsies showed reactive gastropathy without H. pylori.

## 2020-06-11 NOTE — Telephone Encounter (Signed)
Called pt and made her aware of Dr. Tarri Glenn recommendations as well as scheduling process. Verbalized acceptance and understanding.

## 2020-08-18 ENCOUNTER — Encounter: Payer: Self-pay | Admitting: Gastroenterology

## 2020-10-26 ENCOUNTER — Encounter (HOSPITAL_COMMUNITY): Payer: Self-pay | Admitting: Psychiatry

## 2020-10-26 ENCOUNTER — Other Ambulatory Visit: Payer: Self-pay

## 2020-10-26 ENCOUNTER — Telehealth (INDEPENDENT_AMBULATORY_CARE_PROVIDER_SITE_OTHER): Payer: No Typology Code available for payment source | Admitting: Psychiatry

## 2020-10-26 DIAGNOSIS — F330 Major depressive disorder, recurrent, mild: Secondary | ICD-10-CM | POA: Diagnosis not present

## 2020-10-26 DIAGNOSIS — F419 Anxiety disorder, unspecified: Secondary | ICD-10-CM | POA: Diagnosis not present

## 2020-10-26 MED ORDER — PAROXETINE HCL 40 MG PO TABS: 40.0000 mg | ORAL_TABLET | Freq: Every day | ORAL | 1 refills | Status: DC

## 2020-10-26 NOTE — Progress Notes (Signed)
Virtual Visit via Telephone Note  I connected with Melissa Case on 10/26/20 at  8:40 AM EDT by telephone and verified that I am speaking with the correct person using two identifiers.  Location: Patient: Home Provider: Home office   I discussed the limitations, risks, security and privacy concerns of performing an evaluation and management service by telephone and the availability of in person appointments. I also discussed with the patient that there may be a patient responsible charge related to this service. The patient expressed understanding and agreed to proceed.   History of Present Illness: Patient is evaluated by phone session.  She is doing much better and reported her job is going well.  She recently had a visit to [place] for 1 week in May with her husband and dogs.  She enjoyed the time with her daughter and grandkids.  She wanted to see her middle son who lives there too but his family was sick and she does not want to take any chances to get sick and did not visit him.  Overall she feels things are going well.  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  She denies any panic attack, anxiety attack, crying spells or any feeling of hopelessness or worthlessness.  Overall her general health is labile.  Her appetite is back and her weight is stable.  She sleeps most of the nights at least 7 hours but does require trazodone rarely when she needed.  She is a still using leftover trazodone and does not need a new prescription.  She feels the Paxil helping her depression and anxiety.  She also tried to keep herself busy and working full-time and happy that she recently had a 5-year anniversary of her job.  Her husband is very supportive.  She is optimistic and denies any negative thoughts.  She like to keep the Paxil at current dose.   Past Psychiatric History: H/O anxiety and depression.Noh/oinpatient treatment, suicidal  attempt,orpsychosis. Trazodoneworked.  Psychiatric Specialty Exam: Physical Exam  Review of Systems  Weight 150 lb (68 kg).There is no height or weight on file to calculate BMI.  General Appearance: NA  Eye Contact:  NA  Speech:  Slow  Volume:  Normal  Mood:  Euthymic  Affect:  NA  Thought Process:  Goal Directed  Orientation:  Full (Time, Place, and Person)  Thought Content:  WDL  Suicidal Thoughts:  No  Homicidal Thoughts:  No  Memory:  Immediate;   Good Recent;   Good Remote;   Good  Judgement:  Intact  Insight:  Present  Psychomotor Activity:  NA  Concentration:  Concentration: Fair and Attention Span: Fair  Recall:  Good  Fund of Knowledge:  Good  Language:  Good  Akathisia:  No  Handed:  Right  AIMS (if indicated):     Assets:  Communication Skills Desire for Improvement Housing Resilience Social Support Transportation  ADL's:  Intact  Cognition:  WNL  Sleep:   good      Assessment and Plan: Major depressive disorder, recurrent.  Anxiety.  Patient is stable on Paxil 40 mg daily.  She rarely takes trazodone and does not need the new prescription however agreed to give [place] a call if need trazodone in the future.  She has not seen PCP in a while but like to schedule appointment for annual physical and blood work.  Discussed medication side effects and benefits.  Recommended to call us back if  she has any question or any concern.  Follow-up and 6 months.  Follow Up Instructions:    I discussed the assessment and treatment plan with the patient. The patient was provided an opportunity to ask questions and all were answered. The patient agreed with the plan and demonstrated an understanding of the instructions.   The patient was advised to call back or seek an in-person evaluation if the symptoms worsen or if the condition fails to improve as anticipated.  I provided 16 minutes of non-face-to-face time during this encounter.   Kathlee Nations, MD

## 2020-11-17 ENCOUNTER — Encounter (HOSPITAL_COMMUNITY): Payer: Self-pay | Admitting: Internal Medicine

## 2020-11-17 ENCOUNTER — Inpatient Hospital Stay (HOSPITAL_COMMUNITY): Payer: PRIVATE HEALTH INSURANCE

## 2020-11-17 ENCOUNTER — Emergency Department (HOSPITAL_COMMUNITY): Payer: PRIVATE HEALTH INSURANCE

## 2020-11-17 ENCOUNTER — Inpatient Hospital Stay (HOSPITAL_COMMUNITY)
Admission: EM | Admit: 2020-11-17 | Discharge: 2020-11-25 | DRG: 091 | Disposition: A | Discharge status: Home Health | Payer: PRIVATE HEALTH INSURANCE | Attending: Internal Medicine | Admitting: Internal Medicine

## 2020-11-17 ENCOUNTER — Other Ambulatory Visit: Payer: Self-pay

## 2020-11-17 DIAGNOSIS — K219 Gastro-esophageal reflux disease without esophagitis: Secondary | ICD-10-CM | POA: Diagnosis not present

## 2020-11-17 DIAGNOSIS — G0490 Encephalitis and encephalomyelitis, unspecified: Secondary | ICD-10-CM

## 2020-11-17 DIAGNOSIS — G936 Cerebral edema: Secondary | ICD-10-CM

## 2020-11-17 DIAGNOSIS — R569 Unspecified convulsions: Secondary | ICD-10-CM

## 2020-11-17 DIAGNOSIS — I6789 Other cerebrovascular disease: Secondary | ICD-10-CM | POA: Diagnosis present

## 2020-11-17 DIAGNOSIS — R471 Dysarthria and anarthria: Secondary | ICD-10-CM | POA: Diagnosis present

## 2020-11-17 DIAGNOSIS — Y848 Other medical procedures as the cause of abnormal reaction of the patient, or of later complication, without mention of misadventure at the time of the procedure: Secondary | ICD-10-CM | POA: Diagnosis present

## 2020-11-17 DIAGNOSIS — F410 Panic disorder [episodic paroxysmal anxiety] without agoraphobia: Secondary | ICD-10-CM | POA: Diagnosis present

## 2020-11-17 DIAGNOSIS — Z85818 Personal history of malignant neoplasm of other sites of lip, oral cavity, and pharynx: Secondary | ICD-10-CM | POA: Diagnosis not present

## 2020-11-17 DIAGNOSIS — G9782 Other postprocedural complications and disorders of nervous system: Secondary | ICD-10-CM | POA: Diagnosis present

## 2020-11-17 DIAGNOSIS — Z9049 Acquired absence of other specified parts of digestive tract: Secondary | ICD-10-CM | POA: Diagnosis not present

## 2020-11-17 DIAGNOSIS — G510 Bell's palsy: Secondary | ICD-10-CM | POA: Diagnosis present

## 2020-11-17 DIAGNOSIS — G9389 Other specified disorders of brain: Secondary | ICD-10-CM

## 2020-11-17 DIAGNOSIS — Z8249 Family history of ischemic heart disease and other diseases of the circulatory system: Secondary | ICD-10-CM

## 2020-11-17 DIAGNOSIS — R739 Hyperglycemia, unspecified: Secondary | ICD-10-CM | POA: Diagnosis present

## 2020-11-17 DIAGNOSIS — Z79899 Other long term (current) drug therapy: Secondary | ICD-10-CM

## 2020-11-17 DIAGNOSIS — Z885 Allergy status to narcotic agent status: Secondary | ICD-10-CM

## 2020-11-17 DIAGNOSIS — Z90710 Acquired absence of both cervix and uterus: Secondary | ICD-10-CM | POA: Diagnosis not present

## 2020-11-17 DIAGNOSIS — Z79891 Long term (current) use of opiate analgesic: Secondary | ICD-10-CM

## 2020-11-17 DIAGNOSIS — Z818 Family history of other mental and behavioral disorders: Secondary | ICD-10-CM

## 2020-11-17 DIAGNOSIS — Z923 Personal history of irradiation: Secondary | ICD-10-CM | POA: Diagnosis not present

## 2020-11-17 DIAGNOSIS — F32A Depression, unspecified: Secondary | ICD-10-CM | POA: Diagnosis present

## 2020-11-17 DIAGNOSIS — F17210 Nicotine dependence, cigarettes, uncomplicated: Secondary | ICD-10-CM | POA: Diagnosis present

## 2020-11-17 DIAGNOSIS — Z880 Allergy status to penicillin: Secondary | ICD-10-CM

## 2020-11-17 DIAGNOSIS — Z20822 Contact with and (suspected) exposure to covid-19: Secondary | ICD-10-CM | POA: Diagnosis present

## 2020-11-17 LAB — COMPREHENSIVE METABOLIC PANEL
ALT: 21 U/L (ref 0–44)
AST: 31 U/L (ref 15–41)
Albumin: 4.1 g/dL (ref 3.5–5.0)
Alkaline Phosphatase: 81 U/L (ref 38–126)
Anion gap: 12 (ref 5–15)
BUN: 12 mg/dL (ref 6–20)
CO2: 22 mmol/L (ref 22–32)
Calcium: 9.1 mg/dL (ref 8.9–10.3)
Chloride: 102 mmol/L (ref 98–111)
Creatinine, Ser: 0.73 mg/dL (ref 0.44–1.00)
GFR, Estimated: >60 mL/min (ref >60)
Glucose, Bld: 197 mg/dL — ABNORMAL HIGH (ref 70–99)
Potassium: 4.2 mmol/L (ref 3.5–5.1)
Sodium: 136 mmol/L (ref 135–145)
Total Bilirubin: 0.3 mg/dL (ref 0.3–1.2)
Total Protein: 7.4 g/dL (ref 6.5–8.1)

## 2020-11-17 LAB — I-STAT CHEM 8, ED
BUN: 15 mg/dL (ref 6–20)
Calcium, Ion: 1.17 mmol/L (ref 1.15–1.40)
Chloride: 103 mmol/L (ref 98–111)
Creatinine, Ser: 0.6 mg/dL (ref 0.44–1.00)
Glucose, Bld: 198 mg/dL — ABNORMAL HIGH (ref 70–99)
HCT: 45 % (ref 36.0–46.0)
Hemoglobin: 15.3 g/dL — ABNORMAL HIGH (ref 12.0–15.0)
Potassium: 4.2 mmol/L (ref 3.5–5.1)
Sodium: 138 mmol/L (ref 135–145)
TCO2: 27 mmol/L (ref 22–32)

## 2020-11-17 LAB — CBC WITH DIFFERENTIAL/PLATELET
Abs Immature Granulocytes: 0.05 10*3/uL (ref 0.00–0.07)
Basophils Absolute: 0.1 10*3/uL (ref 0.0–0.1)
Basophils Relative: 1 %
Eosinophils Absolute: 0 10*3/uL (ref 0.0–0.5)
Eosinophils Relative: 0 %
HCT: 44.4 % (ref 36.0–46.0)
Hemoglobin: 14.2 g/dL (ref 12.0–15.0)
Immature Granulocytes: 1 %
Lymphocytes Relative: 9 %
Lymphs Abs: 0.6 10*3/uL — ABNORMAL LOW (ref 0.7–4.0)
MCH: 30.2 pg (ref 26.0–34.0)
MCHC: 32 g/dL (ref 30.0–36.0)
MCV: 94.5 fL (ref 80.0–100.0)
Monocytes Absolute: 0.3 10*3/uL (ref 0.1–1.0)
Monocytes Relative: 4 %
Neutro Abs: 5.6 10*3/uL (ref 1.7–7.7)
Neutrophils Relative %: 85 %
Platelets: 313 10*3/uL (ref 150–400)
RBC: 4.7 MIL/uL (ref 3.87–5.11)
RDW: 13.2 % (ref 11.5–15.5)
WBC: 6.6 10*3/uL (ref 4.0–10.5)
nRBC: 0 % (ref 0.0–0.2)

## 2020-11-17 LAB — RESP PANEL BY RT-PCR (FLU A&B, COVID) ARPGX2
Influenza A by PCR: NEGATIVE
Influenza B by PCR: NEGATIVE
SARS Coronavirus 2 by RT PCR: NEGATIVE

## 2020-11-17 LAB — PROTIME-INR
INR: 0.9 (ref 0.8–1.2)
Prothrombin Time: 12.5 seconds (ref 11.4–15.2)

## 2020-11-17 LAB — APTT: aPTT: 27 seconds (ref 24–36)

## 2020-11-17 LAB — CBG MONITORING, ED: Glucose-Capillary: 196 mg/dL — ABNORMAL HIGH (ref 70–99)

## 2020-11-17 LAB — ETHANOL: Alcohol, Ethyl (B): <10 mg/dL (ref <10)

## 2020-11-17 LAB — LACTIC ACID, PLASMA
Lactic Acid, Venous: 4.5 mmol/L (ref 0.5–1.9)
Lactic Acid, Venous: 1.3 mmol/L (ref 0.5–1.9)

## 2020-11-17 LAB — HIV ANTIBODY (ROUTINE TESTING W REFLEX): HIV Screen 4th Generation wRfx: NONREACTIVE

## 2020-11-17 MED ORDER — ENOXAPARIN SODIUM 40 MG/0.4ML IJ SOSY
40.0000 mg | PREFILLED_SYRINGE | INTRAMUSCULAR | Status: DC
  Administered 2020-11-17: 40 mg via SUBCUTANEOUS
  Filled 2020-11-17: qty 0.4

## 2020-11-17 MED ORDER — ONDANSETRON HCL 4 MG PO TABS: 4.0000 mg | ORAL_TABLET | Freq: Four times a day (QID) | ORAL | Status: DC | PRN

## 2020-11-17 MED ORDER — ONDANSETRON HCL 4 MG/2ML IJ SOLN
4.0000 mg | Freq: Four times a day (QID) | INTRAMUSCULAR | Status: DC | PRN
  Administered 2020-11-17 – 2020-11-23 (×5): 4 mg via INTRAVENOUS
  Filled 2020-11-17 (×5): qty 2

## 2020-11-17 MED ORDER — SODIUM CHLORIDE 0.9 % IV BOLUS
1000.0000 mL | Freq: Once | INTRAVENOUS | Status: AC
  Administered 2020-11-17: 1000 mL via INTRAVENOUS

## 2020-11-17 MED ORDER — LORAZEPAM 2 MG/ML IJ SOLN: 4.0000 mg | INTRAMUSCULAR | Status: DC | PRN

## 2020-11-17 MED ORDER — ACETAMINOPHEN 500 MG PO TABS
1000.0000 mg | ORAL_TABLET | Freq: Four times a day (QID) | ORAL | Status: DC | PRN
  Administered 2020-11-21: 1000 mg via ORAL
  Filled 2020-11-17: qty 2

## 2020-11-17 MED ORDER — GADOBUTROL 1 MMOL/ML IV SOLN
6.0000 mL | Freq: Once | INTRAVENOUS | Status: AC | PRN
  Administered 2020-11-17: 6 mL via INTRAVENOUS

## 2020-11-17 MED ORDER — PAROXETINE HCL 20 MG PO TABS
40.0000 mg | ORAL_TABLET | Freq: Every day | ORAL | Status: DC
  Administered 2020-11-18 – 2020-11-25 (×8): 40 mg via ORAL
  Filled 2020-11-17 (×8): qty 2

## 2020-11-17 MED ORDER — SODIUM CHLORIDE 0.9 % IV SOLN: INTRAVENOUS | Status: DC

## 2020-11-17 MED ORDER — PANTOPRAZOLE SODIUM 40 MG PO TBEC
40.0000 mg | DELAYED_RELEASE_TABLET | Freq: Every day | ORAL | Status: DC
  Administered 2020-11-18 – 2020-11-25 (×8): 40 mg via ORAL
  Filled 2020-11-17 (×8): qty 1

## 2020-11-17 MED ORDER — TRAZODONE HCL 50 MG PO TABS: 50.0000 mg | ORAL_TABLET | Freq: Every evening | ORAL | Status: DC | PRN

## 2020-11-17 MED ORDER — DOCUSATE SODIUM 100 MG PO CAPS: 100.0000 mg | ORAL_CAPSULE | Freq: Every day | ORAL | Status: DC | PRN

## 2020-11-17 MED ORDER — LEVETIRACETAM IN NACL 500 MG/100ML IV SOLN
500.0000 mg | Freq: Two times a day (BID) | INTRAVENOUS | Status: DC
  Administered 2020-11-17: 500 mg via INTRAVENOUS
  Filled 2020-11-17: qty 100

## 2020-11-17 MED ORDER — DEXTROSE 5 % IV SOLN
10.0000 mg/kg | Freq: Three times a day (TID) | INTRAVENOUS | Status: DC
  Administered 2020-11-17 – 2020-11-23 (×17): 680 mg via INTRAVENOUS
  Filled 2020-11-17 (×20): qty 13.6

## 2020-11-17 MED ORDER — DEXAMETHASONE SODIUM PHOSPHATE 10 MG/ML IJ SOLN
10.0000 mg | Freq: Once | INTRAMUSCULAR | Status: AC
  Administered 2020-11-17: 10 mg via INTRAVENOUS
  Filled 2020-11-17: qty 1

## 2020-11-17 MED ORDER — POLYVINYL ALCOHOL 1.4 % OP SOLN: 1.0000 [drp] | Freq: Every day | OPHTHALMIC | Status: DC | PRN

## 2020-11-17 MED ORDER — DEXAMETHASONE SODIUM PHOSPHATE 4 MG/ML IJ SOLN
4.0000 mg | Freq: Four times a day (QID) | INTRAMUSCULAR | Status: DC
  Administered 2020-11-17 – 2020-11-25 (×32): 4 mg via INTRAVENOUS
  Filled 2020-11-17 (×31): qty 1

## 2020-11-17 MED ORDER — SODIUM CHLORIDE 0.9 % IV SOLN
3000.0000 mg | Freq: Once | INTRAVENOUS | Status: AC
  Administered 2020-11-17: 3000 mg via INTRAVENOUS
  Filled 2020-11-17: qty 30

## 2020-11-17 NOTE — ED Triage Notes (Signed)
Pt BIB [facility] from home for questionable stroke, seizure like activity about 45 mins ago. Pt no prior hx of seizures. EMS admin 2.5 mg of Versed. Pt GCS 3. Nasal trumpet in place, 4 L O2 [place]. All other VSS. Dr. Regenia Skeeter to bed evaluating.

## 2020-11-17 NOTE — Progress Notes (Signed)
Started cEEG study.  Notified [facility] monitoring.

## 2020-11-17 NOTE — Procedures (Signed)
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 EEG to evaluate for seizure  Level of alertness: Awake  AEDs during EEG study: LEV  Technical aspects: This EEG study was done with scalp electrodes positioned according to the 10-20 International system of electrode placement. Electrical activity was acquired at a sampling rate of 500Hz  and reviewed with a high frequency filter of 70Hz  and a low frequency filter of 1Hz . EEG data were recorded continuously and digitally stored.   Description: No posterior dominant rhythm was seen. EEG showed continuous generalized and lateralized polymorphic sharply contoured 5 to 9 Hz theta-alpha activity. Continuous 2-3hz  delta slowing was also noted in right temporo-parietal region. Lateralized periodic discharges were noted in right hemisphere, maximal ight temporo-parietal region, at 1hz  with superimposed rhythmicity at times.  Hyperventilation and photic stimulation were not performed.     ABNORMALITY - Lateralized periodic discharges with overriding rhythmic activity ( LPD +) right, maximal temporo-parietal region - Continuous slow, generalized and maximal right temporo-parietal region.  IMPRESSION: This study howed evidence of epileptogenicity as well as cortical dysfunction arising from right temporo-parietal region likely secondary to underlying structural abnormality/ mass. The lateralized periodic discharges with overriding rhythmic activity are on the ictal-interictal continuum with high potential for seizures, There is also moderate diffuse encephalopathy, nonspecific etiology. No seizures were seen throughout  the recording.  Dr Rory Percy was notified.   Eldrick Penick [name]

## 2020-11-17 NOTE — ED Provider Notes (Signed)
[facility] EMERGENCY DEPARTMENT Provider Note   CSN: [id number] Arrival date & time: 11/17/20  1208  LEVEL 5 CAVEAT - ALTERED MENTAL STATUS   History Chief Complaint  Patient presents with   Altered Mental Status    Melissa Case is a 60 y.o. female.  HPI 60 year old female presents with acute altered mental status and seizure-like activity.  EMS was called out for altered mental state starting around 10 AM (last normal was when she woke up around 8) and shaking.  Several minutes of shaking/convulsing according to the husband but it was unclear if it was rhythmic, tonic/clonic, etc.  Per EMS the patient has been basically unresponsive though at times she would have her left eyes deviated to the left.  The interpreter this is possible seizure-like activity and gave 2.5 mg Versed which resolved it.  Since then she has been unresponsive.  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   Gall stone 04/05/2012   Depression 09/15/2011    Past Surgical History:  Procedure Laterality Date   ABDOMINAL HYSTERECTOMY     CHOLECYSTECTOMY N/A 11/21/2017   Procedure: LAPAROSCOPIC CHOLECYSTECTOMY WITH INTRAOPERATIVE CHOLANGIOGRAM;  Surgeon: Jovita Kussmaul, MD;  Location: [facility];  Service: General;  Laterality: N/A;   MULTIPLE EXTRACTIONS WITH ALVEOLOPLASTY N/A 08/28/2019   Procedure: Extraction of tooth #'s 3-7, 10-15, and 20-31 with alveoloplatsy, maxillary right buccal exostosis reduction, and bilateral mandibular tori reductions.;  Surgeon: Lenn Cal, DDS;  Location: [place] [facility];  Service: Oral Surgery;   Laterality: N/A;   PAROTIDECTOMY Right 07/22/2019   with biopsy; done at [facility] by Dr. Fredricka Bonine     OB History   No obstetric history on file.     Family History  Problem Relation Age of Onset   Depression Sister    Heart disease Mother    Diabetes Father    Breast cancer Paternal Grandmother    Colon cancer Neg Hx    Pancreatic cancer Neg Hx    Esophageal cancer Neg Hx    Stomach cancer Neg Hx    Rectal cancer Neg Hx    Liver cancer Neg Hx     Social History   Tobacco Use   Smoking status: Every Day    Packs/day: 0.50    Years: 42.00    Pack years: 21.00    Types: Cigarettes    Start date: 1978   Smokeless tobacco: Never  Vaping Use   Vaping Use: Never used  Substance Use Topics   Alcohol use: No    Alcohol/week: 0.0 standard drinks   Drug use: No    Home Medications Prior to Admission medications   Medication Sig Start Date End Date Taking? Authorizing Provider  acetaminophen (TYLENOL) 500 MG tablet Take 1,000 mg by mouth every 6 (six) hours as needed for mild pain.    [provider]  carboxymethylcellulose (REFRESH PLUS) 0.5 % SOLN Place 1 drop into the right eye daily as needed (dry eye).     [provider]  docusate sodium (COLACE) 100 MG capsule Take 100 mg  by mouth daily as needed for mild constipation (uses once in a while).    [provider]  esomeprazole (NEXIUM) 40 MG capsule Take 1 capsule (40 mg total) by mouth daily. 05/28/20   [place], MD  PARoxetine (PAXIL) 40 MG tablet Take 1 tablet (40 mg total) by mouth daily. 10/26/20   Arfeen, Arlyce Harman, MD  traZODone (DESYREL) 50 MG tablet Take 1/2 to one tab as needed for insomnia. 07/21/19   Arfeen, Arlyce Harman, MD    Allergies    Morphine and Penicillins  Review of Systems   Review of Systems  Unable to perform ROS: Mental status change   Physical Exam Updated Vital Signs BP (!) 144/75   Pulse 100   Temp 97.8 F (36.6 C) (Rectal)   Resp 20   Ht 5\' 5"  (1.651  m)   Wt 68 kg   SpO2 100%   BMI 24.95 kg/m   Physical Exam Vitals and nursing note reviewed.  Constitutional:      Appearance: She is well-developed.  HENT:     Head: Normocephalic and atraumatic.     Right Ear: External ear normal.     Left Ear: External ear normal.     Nose: Nose normal.  Eyes:     General:        Right eye: No discharge.        Left eye: No discharge.  Cardiovascular:     Rate and Rhythm: Normal rate and regular rhythm.     Heart sounds: Normal heart sounds.  Pulmonary:     Effort: Pulmonary effort is normal.     Breath sounds: Normal breath sounds.  Abdominal:     Palpations: Abdomen is soft.     Tenderness: There is no abdominal tenderness.  Skin:    General: Skin is warm and dry.  Neurological:     Mental Status: She is unresponsive.     Comments: When I tried sternal rub patient she moves her head from left to right.  However she does not follow commands.  A little bit later during the initial assessment she is starting to try and roll over on her own but again does not follow commands. Spontaneously moves all 4 extremities  Psychiatric:        Mood and Affect: Mood is not anxious.    ED Results / Procedures / Treatments   Labs (all labs ordered are listed, but only abnormal results are displayed) Labs Reviewed  COMPREHENSIVE METABOLIC PANEL - Abnormal; Notable for the following components:      Result Value   Glucose, Bld 197 (*)    All other components within normal limits  CBC WITH DIFFERENTIAL/PLATELET - Abnormal; Notable for the following components:   Lymphs Abs 0.6 (*)    All other components within normal limits  LACTIC ACID, PLASMA - Abnormal; Notable for the following components:   Lactic Acid, Venous 4.5 (*)    All other components within normal limits  I-STAT CHEM 8, ED - Abnormal; Notable for the following components:   Glucose, Bld 198 (*)    Hemoglobin 15.3 (*)    All other components within normal limits  CBG MONITORING,  ED - Abnormal; Notable for the following components:   Glucose-Capillary 196 (*)    All other components within normal limits  RESP PANEL BY RT-PCR (FLU A&B, COVID) ARPGX2  ETHANOL  PROTIME-INR  APTT  RAPID URINE DRUG SCREEN, HOSP PERFORMED  URINALYSIS, ROUTINE W REFLEX MICROSCOPIC  LACTIC ACID, PLASMA  HIV ANTIBODY (ROUTINE TESTING W REFLEX)  HEMOGLOBIN A1C    EKG EKG Interpretation  Date/Time:  Wednesday November 17 2020 12:39:09 EDT Ventricular Rate:  113 PR Interval:  185 QRS Duration: 96 QT Interval:  326 QTC Calculation: 447 R Axis:   49 Text Interpretation: Sinus tachycardia LAE, consider biatrial enlargement rate is faster compared to 2016 Confirmed by Sherwood Gambler [phone]) on 11/17/2020 1:55:19 PM  Radiology CT HEAD WO CONTRAST  Result Date: 11/17/2020 CLINICAL DATA:  Altered mental status following a syncopal episode. Seizure-like activity. Possible stroke. History of right-sided parotid carcinoma. EXAM: CT HEAD WITHOUT CONTRAST TECHNIQUE: Contiguous axial images were obtained from the base of the skull through the vertex without intravenous contrast. COMPARISON:  Head MRI 06/25/2019 FINDINGS: Brain: There is a new moderate-sized region of vasogenic edema involving right temporal lobe white matter. There are a few small foci of calcification along the inferior aspect of the right temporal lobe which may be associated with some abnormal soft tissue in this region, however a discrete mass is not well demonstrated on this unenhanced study. There is mild local mass effect without midline shift. No acute cortically based infarct, intracranial hemorrhage, or extra-axial fluid collection is identified. The ventricles are normal in size. Vascular: Calcified atherosclerosis at the skull base. No hyperdense vessel. Skull: No fracture or suspicious osseous lesion. Sinuses/Orbits: Partially visualized mucous retention cysts in the right maxillary sinus. New moderate-sized right mastoid  effusion. Right-sided eyelid weight. Other: Partially visualized sequelae of right parotidectomy. IMPRESSION: 1. New moderate-sized region of vasogenic edema in the right temporal lobe, suspicious for metastatic disease with this history. Brain MRI without and with contrast is recommended for further evaluation. 2. New right mastoid effusion. These results were called by telephone at the time of interpretation on 11/17/2020 at 12:50 pm to Dr. Sherwood Gambler, who verbally acknowledged these results. Electronically Signed   By: Logan Bores M.D.   On: 11/17/2020 12:51    Procedures .Critical Care  Date/Time: 11/17/2020 3:51 PM Performed by: Sherwood Gambler, MD Authorized by: Sherwood Gambler, MD   Critical care provider statement:    Critical care time (minutes):  40   Critical care time was exclusive of:  Separately billable procedures and treating other patients   Critical care was necessary to treat or prevent imminent or life-threatening deterioration of the following conditions:  CNS failure or compromise   Critical care was time spent personally by me on the following activities:  Discussions with consultants, evaluation of patient's response to treatment, examination of patient, ordering and performing treatments and interventions, ordering and review of laboratory studies, ordering and review of radiographic studies, pulse oximetry, re-evaluation of patient's condition, obtaining history from patient or surrogate and review of old charts   Medications Ordered in ED Medications  levETIRAcetam (KEPPRA) IVPB 500 mg/100 mL premix (has no administration in time range)  dexamethasone (DECADRON) injection 4 mg (has no administration in time range)  acetaminophen (TYLENOL) tablet 1,000 mg (has no administration in time range)  PARoxetine (PAXIL) tablet 40 mg (0 mg Oral Hold 11/17/20 1447)  traZODone (DESYREL) tablet 50 mg (has no administration in time range)  docusate sodium (COLACE) capsule 100 mg  (has no administration in time range)  pantoprazole (PROTONIX) EC tablet 40 mg (0 mg Oral Hold 11/17/20 1447)  polyvinyl alcohol (LIQUIFILM TEARS) 1.4 % ophthalmic solution 1 drop (has no administration in time range)  enoxaparin (LOVENOX) injection 40 mg (has no administration in time range)  LORazepam (  ATIVAN) injection 4 mg (has no administration in time range)  ondansetron (ZOFRAN) tablet 4 mg (has no administration in time range)    Or  ondansetron (ZOFRAN) injection 4 mg (has no administration in time range)  0.9 %  sodium chloride infusion ( Intravenous New Bag/Given 11/17/20 1457)  sodium chloride 0.9 % bolus 1,000 mL (0 mLs Intravenous Stopped 11/17/20 1310)  levETIRAcetam (KEPPRA) 3,000 mg in sodium chloride 0.9 % 250 mL IVPB (0 mg Intravenous Stopped 11/17/20 1351)  dexamethasone (DECADRON) injection 10 mg (10 mg Intravenous Given 11/17/20 1309)    ED Course  I have reviewed the triage vital signs and the nursing notes.  Pertinent labs & imaging results that were available during my care of the patient were reviewed by me and considered in my medical decision making (see chart for details).    MDM Rules/Calculators/A&P                          Patient is protecting her airway.  No longer seizing.  She has slowly started to wake up more and is gradually starting to follow commands.  A head CT was quickly obtained and unfortunately shows vasogenic edema as above.  No obvious mass but that is the high concern.  This is especially given her prior cancer history.  She was given IV Decadron and Keppra.  I discussed with Dr. Rory Percy, who will get an EEG and consult.  MRI brain with and without has been ordered.  I have also discussed with Dr. Roosevelt Locks for admission.  Probably will need neurosurgery consult after MRI results. Final Clinical Impression(s) / ED Diagnoses Final diagnoses:  Seizure ([place])  Vasogenic brain edema [facility])    Rx / DC Orders ED Discharge Orders     None         Sherwood Gambler, MD 11/17/20 1553

## 2020-11-17 NOTE — H&P (Signed)
History and Physical    Melissa Case PJA:[id number] DOB: 29-Jan-1961 DOA: 11/17/2020  PCP: Patient, No Pcp Per (Inactive) (Confirm with patient/family/NH records and if not entered, this has to be entered at [facility] point of entry) Patient coming from: Home  I have personally briefly reviewed patient's old medical records in [place]  Chief Complaint: Seizure  HPI: Melissa Case is a 60 y.o. 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  Patient was last seen normal breakfast time around 8, at 10 AM, husband noticed patient became unresponsive and started to have whole body shaking, and both eyes deviated to the left.  Patient's husband called EMS, EMS arrived and found patient remained unresponsive with eyes left sided palsy.  2.5 mg of Versed given and seizure activity stopped.  Husband reported patient has no complaints this morning or last night, no headache or vision problems.  Last year, patient developed right-sided facial drooping and went to hospital CT showed parotid gland mass.  Subsequently, patient underwent right-sided parotidectomy and lymph node dissection and then radiation therapy.  No chemotherapy afterward.  And patient underwent multiple PET scan April last year and 3 weeks ago, and  both showed no suspicious metastatic lesions. No MRI brain before.   ED Course: Patient remained postictal in ED, CT head showed new moderate sized vasogenic edema in the right temporal lobe suspicious for metastatic lesion.  Review of Systems: Unable to perform, patient remains unresponsive.  Past Medical History:  Diagnosis Date   Cancer ([place])    Partoid   Depression    GERD (gastroesophageal reflux disease)    Panic attack     Past Surgical History:  Procedure Laterality Date   ABDOMINAL HYSTERECTOMY     CHOLECYSTECTOMY N/A 11/21/2017   Procedure:  LAPAROSCOPIC CHOLECYSTECTOMY WITH INTRAOPERATIVE CHOLANGIOGRAM;  Surgeon: Jovita Kussmaul, MD;  Location: [facility];  Service: General;  Laterality: N/A;   MULTIPLE EXTRACTIONS WITH ALVEOLOPLASTY N/A 08/28/2019   Procedure: Extraction of tooth #'s 3-7, 10-15, and 20-31 with alveoloplatsy, maxillary right buccal exostosis reduction, and bilateral mandibular tori reductions.;  Surgeon: Lenn Cal, DDS;  Location: [place];  Service: Oral Surgery;  Laterality: N/A;   PAROTIDECTOMY Right 07/22/2019   with biopsy; done at [facility] by Dr. Fredricka Bonine     reports that she has been smoking cigarettes. She started smoking about 44 years ago. She has a 21.00 pack-year smoking history. She has never used smokeless tobacco. She reports that she does not drink alcohol and does not use drugs.  Allergies  Allergen Reactions   Morphine Itching   Penicillins Rash    Has patient had a PCN reaction causing immediate rash, facial/tongue/throat swelling, SOB or lightheadedness with hypotension: No Has patient had a PCN reaction causing severe rash involving mucus membranes or skin necrosis: No Has patient had a PCN reaction that required hospitalization No Has patient had a PCN reaction occurring within the last 10 years: No If all of the above answers are "NO", then may proceed with Cephalosporin use.     Family History  Problem Relation Age of Onset   Depression Sister    Heart disease Mother    Diabetes Father    Breast cancer Paternal Grandmother    Colon cancer Neg Hx    Pancreatic cancer Neg Hx    Esophageal cancer Neg Hx    Stomach cancer Neg Hx    Rectal  cancer Neg Hx    Liver cancer Neg Hx      Prior to Admission medications   Medication Sig Start Date End Date Taking? Authorizing Provider  acetaminophen (TYLENOL) 500 MG tablet Take 1,000 mg by mouth every 6 (six) hours as needed for mild pain.    [provider]  carboxymethylcellulose (REFRESH PLUS) 0.5 % SOLN Place 1 drop  into the right eye daily as needed (dry eye).     [provider]  docusate sodium (COLACE) 100 MG capsule Take 100 mg by mouth daily as needed for mild constipation (uses once in a while).    [provider]  esomeprazole (NEXIUM) 40 MG capsule Take 1 capsule (40 mg total) by mouth daily. 05/28/20   Thornton Park, MD  PARoxetine (PAXIL) 40 MG tablet Take 1 tablet (40 mg total) by mouth daily. 10/26/20   Arfeen, Arlyce Harman, MD  traZODone (DESYREL) 50 MG tablet Take 1/2 to one tab as needed for insomnia. 07/21/19   Kathlee Nations, MD    Physical Exam: Vitals:   11/17/20 1315 11/17/20 1330 11/17/20 1345 11/17/20 1400  BP: (!) 151/79 (!) 153/71 (!) 145/74 (!) 144/75  Pulse: (!) 104 (!) 102 (!) 102 100  Resp: 20 18 19 20   Temp:      TempSrc:      SpO2: 100% 100% 100% 100%  Weight:      Height:        Constitutional: NAD, calm, comfortable Vitals:   11/17/20 1315 11/17/20 1330 11/17/20 1345 11/17/20 1400  BP: (!) 151/79 (!) 153/71 (!) 145/74 (!) 144/75  Pulse: (!) 104 (!) 102 (!) 102 100  Resp: 20 18 19 20   Temp:      TempSrc:      SpO2: 100% 100% 100% 100%  Weight:      Height:       Eyes: PERRL, lids and conjunctivae normal ENMT: Mucous membranes are moist. Posterior pharynx clear of any exudate or lesions.Normal dentition.  Neck: normal, supple, no masses, no thyromegaly Respiratory: clear to auscultation bilaterally, no wheezing, no crackles. Normal respiratory effort. No accessory muscle use.  Cardiovascular: Regular rate and rhythm, no murmurs / rubs / gallops. No extremity edema. 2+ pedal pulses. No carotid bruits.  Abdomen: no tenderness, no masses palpated. No hepatosplenomegaly. Bowel sounds positive.  Musculoskeletal: no clubbing / cyanosis. No joint deformity upper and lower extremities. Good ROM, no contractures. Normal muscle tone.  Skin: no rashes, lesions, ulcers. No induration Neurologic: Unable to perform, patient remained unresponsive Psychiatric:  Unresponsive   Labs on Admission: I have personally reviewed following labs and imaging studies  CBC: Recent Labs  Lab 11/17/20 1210 11/17/20 1224  WBC 6.6  --   NEUTROABS 5.6  --   HGB 14.2 15.3*  HCT 44.4 45.0  MCV 94.5  --   PLT 313  --    Basic Metabolic Panel: Recent Labs  Lab 11/17/20 1210 11/17/20 1224  NA 136 138  K 4.2 4.2  CL 102 103  CO2 22  --   GLUCOSE 197* 198*  BUN 12 15  CREATININE 0.73 0.60  CALCIUM 9.1  --    GFR: Estimated Creatinine Clearance: 68.1 mL/min (by C-G formula based on SCr of 0.6 mg/dL). Liver Function Tests: Recent Labs  Lab 11/17/20 1210  AST 31  ALT 21  ALKPHOS 81  BILITOT 0.3  PROT 7.4  ALBUMIN 4.1   No results for input(s): LIPASE, AMYLASE in the last 168 hours.  No results for input(s): AMMONIA in the last 168 hours. Coagulation Profile: Recent Labs  Lab 11/17/20 1210  INR 0.9   Cardiac Enzymes: No results for input(s): CKTOTAL, CKMB, CKMBINDEX, TROPONINI in the last 168 hours. BNP (last 3 results) No results for input(s): PROBNP in the last 8760 hours. HbA1C: No results for input(s): HGBA1C in the last 72 hours. CBG: Recent Labs  Lab 11/17/20 1213  GLUCAP 196*   Lipid Profile: No results for input(s): CHOL, HDL, LDLCALC, TRIG, CHOLHDL, LDLDIRECT in the last 72 hours. Thyroid Function Tests: No results for input(s): TSH, T4TOTAL, FREET4, T3FREE, THYROIDAB in the last 72 hours. Anemia Panel: No results for input(s): VITAMINB12, FOLATE, FERRITIN, TIBC, IRON, RETICCTPCT in the last 72 hours. Urine analysis:    Component Value Date/Time   COLORURINE STRAW (A) 03/21/2020 2058   APPEARANCEUR CLEAR 03/21/2020 2058   LABSPEC 1.008 03/21/2020 2058   PHURINE 6.0 03/21/2020 2058   [place] NEGATIVE 03/21/2020 2058   HGBUR NEGATIVE 03/21/2020 2058   [place] NEGATIVE 03/21/2020 2058   [place] 03/21/2020 2058   PROTEINUR NEGATIVE 03/21/2020 2058   UROBILINOGEN 1.0 02/01/2014 1108   NITRITE NEGATIVE  03/21/2020 2058   LEUKOCYTESUR NEGATIVE 03/21/2020 2058    Radiological Exams on Admission: CT HEAD WO CONTRAST  Result Date: 11/17/2020 CLINICAL DATA:  Altered mental status following a syncopal episode. Seizure-like activity. Possible stroke. History of right-sided parotid carcinoma. EXAM: CT HEAD WITHOUT CONTRAST TECHNIQUE: Contiguous axial images were obtained from the base of the skull through the vertex without intravenous contrast. COMPARISON:  Head MRI 06/25/2019 FINDINGS: Brain: There is a new moderate-sized region of vasogenic edema involving right temporal lobe white matter. There are a few small foci of calcification along the inferior aspect of the right temporal lobe which may be associated with some abnormal soft tissue in this region, however a discrete mass is not well demonstrated on this unenhanced study. There is mild local mass effect without midline shift. No acute cortically based infarct, intracranial hemorrhage, or extra-axial fluid collection is identified. The ventricles are normal in size. Vascular: Calcified atherosclerosis at the skull base. No hyperdense vessel. Skull: No fracture or suspicious osseous lesion. Sinuses/Orbits: Partially visualized mucous retention cysts in the right maxillary sinus. New moderate-sized right mastoid effusion. Right-sided eyelid weight. Other: Partially visualized sequelae of right parotidectomy. IMPRESSION: 1. New moderate-sized region of vasogenic edema in the right temporal lobe, suspicious for metastatic disease with this history. Brain MRI without and with contrast is recommended for further evaluation. 2. New right mastoid effusion. These results were called by telephone at the time of interpretation on 11/17/2020 at 12:50 pm to Dr. Sherwood Gambler, who verbally acknowledged these results. Electronically Signed   By: Logan Bores M.D.   On: 11/17/2020 12:51    EKG: Independently reviewed. Sinus tachy  Assessment/Plan Active Problems:    Seizure ([place])  (please populate well all problems here in Problem List. (For example, if patient is on BP meds at home and you resume or decide to hold them, it is a problem that needs to be her. 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  Admit to PCU for close monitoring.  Neurology saw the patient in the ED, recommend Keppra which was loaded x1 then 500 mg twice daily, neurology further recommended Decadron 4 mg every 6 hours. -MRI ordered, depends on MRI results  we will call neurosurgery and oncology. EEG pending -No driving until cleared by neurology.  Discussed with patient husband at bedside who understood and agreed. -Seizure precautions with PRM Benzos.  Parotid CA status post parotidectomy and radiation therapy -Has been following with ENT and oncology at [facility].  Hyperglycemia -May related to seizure, no Hx of DM -Check A1c  Elevated Lactate -Likely related to seizure activity -Hydration and recheck level.  Anxiety depression -Continue SSRI  GERD -PPI  DVT prophylaxis: Lovenox Code Status: Full Code Family Communication: Husband at bedside Disposition Plan: Expect more than 2 midnight hospital stay Consults called: Neurology Admission status: PCU   Lequita Halt MD [facility] Pager [phone]  11/17/2020, 2:33 PM

## 2020-11-17 NOTE — Progress Notes (Signed)
Received a call from radiology regarding reading of MRI concerning about differential of metastatic lesion versus radiation necrosis versus herpes encephalitis.  Neurology started empirical acyclovir.  Discussed with on-call neurooncology Dr. Mickeal Skinner, who plans to repeat MRI and then come to see the patient tomorrow morning.  Discussed with on-call neurosurgery Dr. Reatha Armour, who plans to come see the patient tonight.

## 2020-11-17 NOTE — Progress Notes (Signed)
[facility]  EEG REPORT    NAME: Melissa Case   MRN: [id number]  DOB: 12/24/1964  GENDER: female AGE: 56yr     SERVICE DATE: 10/20/21  STUDY DURATION: 33 minutes  STUDY NUMBER: 2023  ORDERING PHYSICIAN: Rasmussen, Ben Gregory, DO  EEG TECHNOLOGIST: R. Bastidas  R. EEG T.  LOCATION: [facility]  EEG SYSTEM: [employer]     CLINICAL HISTORY:  60yr old female who is having this EEG done for abnormal spell.    Other Data   MEDICATIONS:  Current Outpatient Medications   Medication Sig Dispense Refill    Albuterol (PROAIR HFA, PROVENTIL HFA, VENTOLIN HFA) 90 mcg/actuation inhaler Take 1-2 puffs by inhalation every 4 hours if needed for wheezing. 17 g 1    Allopurinol (ZYLOPRIM) 300 mg Tablet TAKE ONE TABLET BY MOUTH EVERY DAY 90 tablet 1    Aspirin 81 mg Chewable Tablet Take 1 tablet by mouth every day. 30 tablet 0    Atorvastatin (LIPITOR) 40 mg tablet TAKE ONE TABLET BY MOUTH AT BEDTIME 90 tablet 1    Azelaic Acid (FINACEA) 15 % Gel Apply to forehead, cheeks, nose, and chin twice daily 50 g 3    Azelastine Nasal (ASTELIN) 137 mcg (0.1 %) Spray Instill 2 sprays into EACH nostril 2 times daily. 30 mL 6    Chlorthalidone (THALITONE) 25 mg Tablet TAKE ONE TABLET BY MOUTH EVERY DAY 30 tablet 5    Diclofenac (VOLTAREN) 1 % Gel Apply 2 g to the affected area three times daily if needed. 100 g 1    FLOVENT HFA 110 mcg/actuation Inhaler Take 1 puff by inhalation every day. 12 g 1    Fluticasone (FLONASE) 50 mcg/actuation nasal spray Instill 1 spray into EACH nostril every day. Use after performing nasal saline irrigations 16 g 11    Gabapentin (NEURONTIN) 300 mg Capsule TAKE ONE CAPSULE BY MOUTH EVERY MORNING AND TAKE TWO CAPSULES BY MOUTH EVERY EVENING 90 capsule 5    Losartan (COZAAR) 100 mg tablet TAKE ONE TABLET BY MOUTH EVERY DAY 30 tablet 5    Pantoprazole (PROTONIX) 40 mg Delayed Release Tablet TAKE ONE TABLET BY MOUTH EVERY MORNING BEFORE A MEAL 90 tablet 0    Prazosin (MINIPRESS) 5 mg Capsule Take 1 capsule by mouth  every day at bedtime. 90 capsule 1    Trazodone (DESYREL) 50 mg Tablet TAKE ONE TABLET BY MOUTH AT BEDTIME AS NEEDED 30 tablet 5     No current facility-administered medications for this visit.       TECHNICAL DESCRIPTION:  This digital video EEG was performed using the standard 10-20 system of electrode placement and one channel of EKG.           EEG DESCRIPTION:    Clinical State: Awake  Background:  9-10 Hz; posterior head regions; symmetric, waxing and waning, reactive to eye opening and closure  Beta: 18-22 Hz; frontocentral, symmetric, waxing and waning        No stage II/N2 sleep was recorded    ACTIVATION:  Hyperventilation was performed for 3-minutes, producing no abnormal discharges  Photic stimulation was performed at frequencies ranging from 1-60 Hz, producing no abnormal discharges           IMPRESSION: Normal        CLINICAL CORRELATION:  This EEG in the awake state is normal. There are no focal, lateralized or epileptiform abnormalities.   No episodes of concern were captured.      Palak   Parikh, MD

## 2020-11-17 NOTE — Progress Notes (Signed)
Pharmacy Antibiotic Note  Melissa Case is a 60 y.o. female admitted on 11/17/2020 with new-onset seizure.  MRI results: metastatic lesion vs radiation necrosis (S/P radiation therapy for parotid cancer) vs. Herpes encephalitis. Pharmacy has been consulted for  empiric IV acyclovir dosing.  WBC 6.6, afebrile; Scr 0.60, CrCl 68.1 ml/min (renal function stable)  Plan: Acyclovir 10 mg/kg TBW (680 mg) IV Q 8 hrs, per [place] IV acyclovir protocol NS infusion at 125 ml/hr while on IV acyclovir  Monitor WBC, temp, clinical improvement Monitor BMET Q 24 hrs X first 3 days, then at least every 72 hrs to assess for renal toxicity  Height: 5\' 5"  (165.1 cm) Weight: 68 kg (149 lb 14.6 oz) IBW/kg (Calculated) : 57  Temp (24hrs), Avg:97.8 F (36.6 C), Min:97.8 F (36.6 C), Max:97.8 F (36.6 C)  Recent Labs  Lab 11/17/20 1210 11/17/20 1219 11/17/20 1224 11/17/20 1416  WBC 6.6  --   --   --   CREATININE 0.73  --  0.60  --   LATICACIDVEN  --  4.5*  --  1.3    Estimated Creatinine Clearance: 68.1 mL/min (by C-G formula based on SCr of 0.6 mg/dL).    Allergies  Allergen Reactions   Morphine Itching   Penicillins Rash    Has patient had a PCN reaction causing immediate rash, facial/tongue/throat swelling, SOB or lightheadedness with hypotension: No Has patient had a PCN reaction causing severe rash involving mucus membranes or skin necrosis: No Has patient had a PCN reaction that required hospitalization No Has patient had a PCN reaction occurring within the last 10 years: No If all of the above answers are "NO", then may proceed with Cephalosporin use.     Antimicrobials this admission: Acyclovir IV:  6/29 >>  Microbiology results: 6/29 COVID, flu A, flu B, HIV: negative  Thank you for allowing pharmacy to be a part of this patient's care.  Gillermina Hu, PharmD, BCPS, [facility] Clinical Pharmacist 11/17/2020 6:41 PM

## 2020-11-17 NOTE — Progress Notes (Signed)
Husband at bedside. Laying in bed w/ eyes closed. Opened eyes to verbal stimuli and alert to person/place/month and year, and is able to state who the president is. She denies pain. Does c/o nausea-Zofran given per order. Acyclovir is infusing and IV decadron given per order. Continuous EEG in place. O2 sats decrease to 88% and not coming up on RA when she falls asleep. Placed 2L [place] and oxygen sats now 93%. All VS WNL. Continuing to monitor.

## 2020-11-17 NOTE — Consult Note (Signed)
Neurology Consultation  Reason for Consult: Seizure, brain mass Referring Physician: Dr. Sherwood Gambler  CC: Altered mental status, concern for seizure, brain mass on imaging  History is obtained from: Chart  HPI: Melissa Case is a 60 y.o. female past medical history of parotid cancer status post surgical intervention, depression, brought to the emergency room by EMS when they were called to the patient's house for evaluation of altered mental status.  Her last known normal was 8 AM when she woke up and around 10 AM EMS was called because she was shaking and there was concern for seizure-like activity.  The husband told the staff over the phone that she had passed out and then started shaking. According to EMS, she was unresponsive throughout.  She did have some leftward eye deviation intermittently.  She was given 2.5 mg of Versed because of concern for seizure-like activity.  Since then she was very slow to react and unresponsive but is slowly coming around. She is currently unable to provide any history. No family at bedside  In the emergency room, noncontrasted head CT was done which showed a new moderate sized region of vasogenic edema in the right temporal lobe suspicious for metastatic disease with her history of a parotid mass on the same side.  Brain MRI was recommended for further evaluation. She also has a new right mastoid effusion    ROS: Unable to obtain due to altered mental status.   Past Medical History:  Diagnosis Date   Cancer ([place])    Partoid   Depression    GERD (gastroesophageal reflux disease)    Panic attack         Family History  Problem Relation Age of Onset   Depression Sister    Heart disease Mother    Diabetes Father    Breast cancer Paternal Grandmother    Colon cancer Neg Hx    Pancreatic cancer Neg Hx    Esophageal cancer Neg Hx    Stomach cancer Neg Hx    Rectal cancer Neg Hx    Liver cancer Neg Hx      Social History:   reports  that she has been smoking cigarettes. She started smoking about 44 years ago. She has a 21.00 pack-year smoking history. She has never used smokeless tobacco. She reports that she does not drink alcohol and does not use drugs.  Medications  Current Facility-Administered Medications:    levETIRAcetam (KEPPRA) 3,000 mg in sodium chloride 0.9 % 250 mL IVPB, 3,000 mg, Intravenous, Once, Sherwood Gambler, MD, Last Rate: 933.3 mL/hr at 11/17/20 1330, 3,000 mg at 11/17/20 1330  Current Outpatient Medications:    acetaminophen (TYLENOL) 500 MG tablet, Take 1,000 mg by mouth every 6 (six) hours as needed for mild pain., Disp: , Rfl:    carboxymethylcellulose (REFRESH PLUS) 0.5 % SOLN, Place 1 drop into the right eye daily as needed (dry eye). , Disp: , Rfl:    docusate sodium (COLACE) 100 MG capsule, Take 100 mg by mouth daily as needed for mild constipation (uses once in a while)., Disp: , Rfl:    esomeprazole (NEXIUM) 40 MG capsule, Take 1 capsule (40 mg total) by mouth daily., Disp: 90 capsule, Rfl: 3   PARoxetine (PAXIL) 40 MG tablet, Take 1 tablet (40 mg total) by mouth daily., Disp: 90 tablet, Rfl: 1   traZODone (DESYREL) 50 MG tablet, Take 1/2 to one tab as needed for insomnia., Disp: 20 tablet, Rfl: 0   Exam: Current vital signs:  BP (!) 153/71   Pulse (!) 102   Temp 97.8 F (36.6 C) (Rectal)   Resp 18   Ht 5\' 5"  (1.651 m)   Wt 68 kg   SpO2 100%   BMI 24.95 kg/m  Vital signs in last 24 hours: Temp:  [97.8 F (36.6 C)] 97.8 F (36.6 C) (06/29 1219) Pulse Rate:  [102-121] 102 (06/29 1330) Resp:  [18-29] 18 (06/29 1330) BP: (150-169)/(71-82) 153/71 (06/29 1330) SpO2:  [97 %-100 %] 100 % (06/29 1330) Weight:  [68 kg] 68 kg (06/29 1246) General: Nearly obtunded but opens eyes to voice and on coaching did follow some simple commands. HEENT: Normocephalic/atraumatic CVs: Regular rate rhythm Respiratory: Has a nasal trumpet in place and on supplemental oxygen maintaining her airway for  now. CVS: Regular rate rhythm Abdomen nondistended nontender Neurological exam She is nearly obtunded but opens eyes to loud voice and noxious stimulation.  On some coaching she is able to lift her legs and arms against gravity for at least 5 to 10 seconds. 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 Motor exam: She is able to lift all 4 against gravity pretty much symmetrically. Sensory exam: Grimaces and withdraws to noxious simulation in all fours   Labs I have reviewed labs in epic and the results pertinent to this consultation are:   CBC    Component Value Date/Time   WBC 6.6 11/17/2020 1210   RBC 4.70 11/17/2020 1210   HGB 15.3 (H) 11/17/2020 1224   HGB 14.3 07/23/2017 0820   HCT 45.0 11/17/2020 1224   HCT 42.7 07/23/2017 0820   PLT 313 11/17/2020 1210   PLT 240 07/23/2017 0820   MCV 94.5 11/17/2020 1210   MCV 88 07/23/2017 0820   MCH 30.2 11/17/2020 1210   MCHC 32.0 11/17/2020 1210   RDW 13.2 11/17/2020 1210   RDW 13.6 07/23/2017 0820   LYMPHSABS 0.6 (L) 11/17/2020 1210   LYMPHSABS 2.5 07/23/2017 0820   MONOABS 0.3 11/17/2020 1210   EOSABS 0.0 11/17/2020 1210   EOSABS 0.2 07/23/2017 0820   BASOSABS 0.1 11/17/2020 1210   BASOSABS 0.1 07/23/2017 0820    CMP     Component Value Date/Time   NA 138 11/17/2020 1224   NA 140 07/23/2017 0820   K 4.2 11/17/2020 1224   CL 103 11/17/2020 1224   CO2 22 11/17/2020 1210   GLUCOSE 198 (H) 11/17/2020 1224   BUN 15 11/17/2020 1224   BUN 11 07/23/2017 0820   CREATININE 0.60 11/17/2020 1224   CALCIUM 9.1 11/17/2020 1210   PROT 7.4 11/17/2020 1210   PROT 7.0 07/23/2017 0820   ALBUMIN 4.1 11/17/2020 1210   ALBUMIN 4.4 07/23/2017 0820   AST 31 11/17/2020 1210   ALT 21 11/17/2020 1210   ALKPHOS 81 11/17/2020 1210   BILITOT 0.3 11/17/2020 1210   BILITOT 0.2  07/23/2017 0820   GFRNONAA >60 11/17/2020 1210   GFRAA >60 08/26/2019 0830   Imaging I have reviewed the images obtained:  CT-scan of the brain-new right temporal vasogenic edema, new right mastoid effusion  MRI examination of the brain-pending  Assessment: 60 year old with past medical history of a 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 CT head revealing of new right temporal lobe vasogenic edema with concerns for  metastatic process. Clinically it appears that her seizures have resolved but she continues to be extremely lethargic. Likely postictal. Will do MRI of the brain with and without contrast for further evaluation and a stat EEG to rule out ongoing status epilepticus  Impression -Seizure-like activity of unknown duration-question status epilepticus-clinically appears to be resolved as she was following commands -New brain mass-concern for metastatic lesion in the right temporal lobe. 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 Regenia Skeeter, EDP  -- Amie Portland, MD Neurologist [facility] Pager: [phone]   Addendum MRI of the brain completed 2.4 x 1.4 x 0.8 cm peripherally enhancing focus within the inferior right temporal lobe new as compared to the brain MRI of February 2021.  Also new from prior exam there is moderate vasogenic edema within the right temporal lobe extending to the right temporal stem.   These findings may reflect necrosis and edema related to prior radiation therapy however right temporal lobe metastasis cannot be excluded.  Neuro-oncology consultation recommended by radiology. Diffusion-weighted signal abnormality within the cortex and anterior right temporal lobe within the right hippocampus and with right medial thigh thalamus.  Primary considerations seizure related changes versus acute ischemia but infection-including HSV encephalitis cannot be ruled out. Sequela of interval right parotid resection with persistent asymmetric enhancement of the mastoid segment of the right facial nerve. Residual perineural tumor at the site cannot be excluded. New from prior brain MRI there is subtle asymmetric enhancement along the inferior medial aspect of the right middle cranial fossa which could reflect additional perineural tumor spread along the greater superficial petrosal nerve.   EEG with right-sided lateralized periodic discharges-no seizures noted.  Will benefit from LTM EEG. Empiric acyclovir coverage for HSV encephalitis. Will attempt LP tomorrow.  Rest of the plan as above Neurosurgical consultation recommended   -- Amie Portland, MD Neurologist [facility] Pager: [phone]

## 2020-11-17 NOTE — ED Notes (Signed)
Patient transported to MRI 

## 2020-11-18 ENCOUNTER — Encounter (HOSPITAL_COMMUNITY): Payer: Self-pay | Admitting: Internal Medicine

## 2020-11-18 ENCOUNTER — Inpatient Hospital Stay (HOSPITAL_COMMUNITY): Payer: PRIVATE HEALTH INSURANCE

## 2020-11-18 DIAGNOSIS — G0490 Encephalitis and encephalomyelitis, unspecified: Secondary | ICD-10-CM

## 2020-11-18 LAB — URINALYSIS, ROUTINE W REFLEX MICROSCOPIC
Bilirubin Urine: NEGATIVE
Glucose, UA: NEGATIVE mg/dL
Hgb urine dipstick: NEGATIVE
Ketones, ur: NEGATIVE mg/dL
Leukocytes,Ua: NEGATIVE
Nitrite: NEGATIVE
Protein, ur: NEGATIVE mg/dL
Specific Gravity, Urine: 1.025 (ref 1.005–1.030)
pH: 7 (ref 5.0–8.0)

## 2020-11-18 LAB — HEMOGLOBIN A1C
Hgb A1c MFr Bld: 5.8 % — ABNORMAL HIGH (ref 4.8–5.6)
Mean Plasma Glucose: 120 mg/dL

## 2020-11-18 LAB — TYPE AND SCREEN
ABO/RH(D): A NEG
Antibody Screen: NEGATIVE

## 2020-11-18 LAB — ABO/RH: ABO/RH(D): A NEG

## 2020-11-18 LAB — BASIC METABOLIC PANEL
Anion gap: 6 (ref 5–15)
BUN: 5 mg/dL — ABNORMAL LOW (ref 6–20)
CO2: 27 mmol/L (ref 22–32)
Calcium: 8.8 mg/dL — ABNORMAL LOW (ref 8.9–10.3)
Chloride: 102 mmol/L (ref 98–111)
Creatinine, Ser: 0.57 mg/dL (ref 0.44–1.00)
GFR, Estimated: >60 mL/min (ref >60)
Glucose, Bld: 126 mg/dL — ABNORMAL HIGH (ref 70–99)
Potassium: 3.7 mmol/L (ref 3.5–5.1)
Sodium: 135 mmol/L (ref 135–145)

## 2020-11-18 LAB — RAPID URINE DRUG SCREEN, HOSP PERFORMED
Amphetamines: NOT DETECTED
Barbiturates: NOT DETECTED
Benzodiazepines: POSITIVE — AB
Cocaine: NOT DETECTED
Opiates: NOT DETECTED
Tetrahydrocannabinol: NOT DETECTED

## 2020-11-18 LAB — PROTIME-INR
INR: 1 (ref 0.8–1.2)
Prothrombin Time: 13.7 seconds (ref 11.4–15.2)

## 2020-11-18 MED ORDER — PHENYTOIN SODIUM 50 MG/ML IJ SOLN
100.0000 mg | Freq: Three times a day (TID) | INTRAMUSCULAR | Status: DC
  Administered 2020-11-18 – 2020-11-23 (×15): 100 mg via INTRAVENOUS
  Filled 2020-11-18 (×19): qty 2

## 2020-11-18 MED ORDER — SODIUM CHLORIDE 0.9 % IV SOLN
20.0000 mg/kg | Freq: Once | INTRAVENOUS | Status: AC
  Administered 2020-11-18: 1360 mg via INTRAVENOUS
  Filled 2020-11-18: qty 27.2

## 2020-11-18 MED ORDER — SODIUM CHLORIDE 0.9 % IV SOLN: INTRAVENOUS | Status: DC

## 2020-11-18 MED ORDER — LIDOCAINE-EPINEPHRINE 2 %-1:50000 IJ SOLN: 1.7000 mL | Freq: Once | INTRAMUSCULAR | Status: DC

## 2020-11-18 MED ORDER — LEVETIRACETAM IN NACL 1000 MG/100ML IV SOLN
1000.0000 mg | Freq: Two times a day (BID) | INTRAVENOUS | Status: DC
  Administered 2020-11-18 – 2020-11-23 (×11): 1000 mg via INTRAVENOUS
  Filled 2020-11-18 (×11): qty 100

## 2020-11-18 MED ORDER — LIDOCAINE HCL (CARDIAC) PF 100 MG/5ML IV SOSY
PREFILLED_SYRINGE | INTRAVENOUS | Status: AC
  Filled 2020-11-18: qty 5

## 2020-11-18 MED ORDER — LIDOCAINE HCL (CARDIAC) PF 100 MG/5ML IV SOSY
PREFILLED_SYRINGE | INTRAVENOUS | Status: AC
  Administered 2020-11-18: 100 mg
  Filled 2020-11-18: qty 5

## 2020-11-18 MED ORDER — IOHEXOL 300 MG/ML  SOLN
100.0000 mL | Freq: Once | INTRAMUSCULAR | Status: AC | PRN
  Administered 2020-11-18: 100 mL via INTRAVENOUS

## 2020-11-18 NOTE — TOC Initial Note (Signed)
Transition of Care [facility]) - Initial/Assessment Note    Patient Details  Name: Melissa Case MRN: [id number] Date of Birth: [birth year]  [facility] CM/SW Contact:    Verdell Carmine, RN Phone Number: 11/18/2020, 11:16 AM  Clinical Narrative:                  47 tear old history of pituitary cancer, presents with seizure activity. Neurology, neurosurgery, and neurosonology involved, as the MRI revealed questionable metastasis. Patient on anticonvulsants, and will be evaluated here but all  for next steps.  Depending on surgical intervention, as to what needs are post DC CM will follow for needs  Expected Discharge Plan: Home/Self Care Barriers to Discharge: Continued Medical Work up   Patient Goals and CMS Choice        Expected Discharge Plan and Services Expected Discharge Plan: Home/Self Care   Discharge Planning Services: CM Consult   Living arrangements for the past 2 months: Single Family Home                                      Prior Living Arrangements/Services Living arrangements for the past 2 months: Single Family Home Lives with:: Spouse Patient language and need for interpreter reviewed:: Yes        Need for Family Participation in Patient Care: Yes (Comment) Care giver support system in place?: Yes (comment)   Criminal Activity/Legal Involvement Pertinent to Current Situation/Hospitalization: No - Comment as needed  Activities of Daily Living Home Assistive Devices/Equipment: None ADL Screening (condition at time of admission) Patient's cognitive ability adequate to safely complete daily activities?: No Is the patient deaf or have difficulty hearing?: No Does the patient have difficulty seeing, even when wearing glasses/contacts?: No Does the patient have difficulty concentrating, remembering, or making decisions?: Yes Patient able to express need for assistance with ADLs?: Yes Does the patient have difficulty dressing or  bathing?: Yes Independently performs ADLs?: No Communication: Independent Dressing (OT): Needs assistance Is this a change from baseline?: Change from baseline, expected to last >3 days Grooming: Needs assistance Is this a change from baseline?: Change from baseline, expected to last >3 days Feeding: Needs assistance Is this a change from baseline?: Change from baseline, expected to last >3 days Bathing: Needs assistance Is this a change from baseline?: Change from baseline, expected to last <3 days Toileting: Needs assistance Is this a change from baseline?: Change from baseline, expected to last >3days In/Out Bed: Needs assistance Is this a change from baseline?: Change from baseline, expected to last <3 days Walks in Home: Independent Does the patient have difficulty walking or climbing stairs?: No Weakness of Legs: Both Weakness of Arms/Hands: Both  Permission Sought/Granted                  Emotional Assessment       Orientation: : Oriented to Self, Oriented to Place, Oriented to  Time, Oriented to Situation Alcohol / Substance Use: Not Applicable Psych Involvement: No (comment)  Admission diagnosis:  Seizure ([place]) [R56.9] Vasogenic brain edema ([place]) [G93.6] Patient Active Problem List   Diagnosis Date Noted   Seizure ([place]) 11/17/2020   Cancer of parotid gland ([place]) 08/20/2019   Acute calculous cholecystitis 11/21/2017   Abdominal pain 09/30/2013   SBO (small bowel obstruction) ([place]) 09/30/2013   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 ([address] [place] [address] [place] ([place]) [place] [address] Phone: [phone] Fax: [phone]     Social Determinants of Health (SDOH) Interventions    Readmission Risk Interventions No flowsheet data found.

## 2020-11-18 NOTE — Progress Notes (Deleted)
LUMBAR PUNCTURE (SPINAL TAP) PROCEDURE NOTE  Indication: r/o CNS infection   Proceduralists: Clance Boll, NP APN-BC, Dr. Jerelyn Charles   Risks of the procedure were dicussed with the patient including post-LP headache, bleeding, infection, weakness/numbness of legs(radiculopathy), death.    Consent obtained from: husband.    Procedure Note The patient was prepped and draped, and using sterile technique a 20 gauge quinke spinal needle was inserted in the L3-4 and then L 4-5 space.    Unable to obtain CSF after several attempts.   Will request patient to radiology under fluoroscopy for LP, hopefully tomorrow.    Patient tolerated the procedure well and blood loss was minimal.  Clance Boll, NP/Neurology  ATTENDING ADDENDUM I was present for the procedure and attempted myself as well without success. Plan as above.  -- Amie Portland, MD Neurologist [facility] Pager: [phone]

## 2020-11-18 NOTE — Procedures (Signed)
LUMBAR PUNCTURE (SPINAL TAP) PROCEDURE NOTE  Indication: r/o encephalitis   Proceduralists: Forrest Moron NP, A. Rory Percy MD     Risks of the procedure were dicussed with the patient including post-LP headache, bleeding, infection, weakness/numbness of legs(radiculopathy), death.    Consent obtained from: husband    Procedure Note The patient was prepped and draped, and using sterile technique a 20 gauge quinke spinal needle was inserted in the L4-5 space.   Unsuccessful in obtaining fluid return despite multiple attempts. Seems to have scoliosis with possible DJD Will request fluoro guided LP Patient tolerated the procedure well and blood loss was minimal.  -- Amie Portland, MD Neurologist [facility] Pager: [phone]

## 2020-11-18 NOTE — Consult Note (Signed)
   Providing Compassionate, Quality Care - Together  Neurosurgery Consult  Referring physician: Dr. Candiss Norse Reason for referral: Temporal lobe mass  Chief Complaint: Seizure  History of Present Illness: This is a right-handed, 60 year old female, with a history of parotid cancer, status post resection and radiation in 2021 that presented after a seizure.  She was on her way to work when she had a seizure episode that her husband witnessed.  She was brought by EMS to the hospital.  At this time she does not complain of any numbness tingling or weakness.  She does have chronic right facial weakness from her parotid treatment.  Her husband is at bedside, denies any recent fevers or infections.  He does state that she has some sinus issues.  She is a dentulous from her parotid treatment.   Medications: I have reviewed the patient's current medications. Allergies: No Known Allergies  History reviewed. No pertinent family history. Social History:  has no history on file for tobacco use, alcohol use, and drug use.  ROS: All pertinent positives and negatives are listed HPI above  Physical Exam:  Vital signs in last 24 hours: Temp:  [98 F (36.7 C)-98.3 F (36.8 C)] 98 F (36.7 C) (07/25 1814) Pulse Rate:  [58-128] 65 (07/26 0746) Resp:  [11-18] 14 (07/26 0217) BP: (138-182)/(65-125) 153/88 (07/26 0700) SpO2:  [91 %-98 %] 96 % (07/26 0746) PE: EEG in place Awake alert, oriented x3 PERRLA Right-sided chronic facial droop EOMI Moves all extremities equally, full strength No drift   Impression/Assessment:  60 year old female with  Right temporal enhancing lesion, questionable etiology (radiation necrosis versus metastatic spread versus infectious etiology) Seizures  Plan:  -We will obtain CT, thin slices for evaluation of the skull base -May benefit from MR spectroscopy if possible, will discuss with radiology -Likely will need biopsy during this admission (needle versus open with  resection).  Appreciate recommendations from Dr. Mickeal Skinner, neuro-oncology -Continue antiepileptic therapies -Supportive care   Thank you for allowing me to participate in this patient's care.  Please do not hesitate to call with questions or concerns.   Elwin Sleight, [facility] Cell: [phone]

## 2020-11-18 NOTE — Progress Notes (Signed)
Neuro Oncology Note  Stopped by to evaluate patient, she was off floor in radiology.  Reviewed case and discussed with neurology, neurosurgery.  We are in agreement with planned infectious workup and CSF analysis, due to remaining suspicion for non-neoplastic etiology.  Do not suspect this is related to known parotid mass/malignancy.  Also recommended CSF be sent for cytology, in addition to CT chest/abdomen/pelvis.  Brain biopsy may be appropriate if workup is unremarkable, will defer to Dr. Reatha Armour.  Will con't to follow, please call with questions.  Ventura Sellers, MD

## 2020-11-18 NOTE — Progress Notes (Signed)
Physical Therapy Evaluation Patient Details Name: Melissa Case MRN: [id number] DOB: 02-Jul-1960 Today's Date: 11/18/2020   History of Present Illness  Pt is a 60 year old female admitted 6/29 that presented after a seizure.  She was on her way to work when she had a seizure episode that her husband witnessed.  CT revealed right temporal mass suspicious for metastatic lesion.  EEG in place on evaluation.  Neuro following pt. PMH:  parotid cancer, status post resection and radiation in 2021  Clinical Impression  Pt admitted with above diagnosis. Pt was limited to EOB/transfers due to EEG in place.  Pt needed to urinate and was incontinent getting to 3N1 needing min assist of 2 for safety to safely get to 3N1 and back to bed.  Pt very impulsive needing incr cues to slow down and for redirection. Noted right LE incoordination when stepping as well as right lateral lean.  Pt appeared to be unaware of lines and difficulty following cues for safety. Pt still being assessed by Neuro therefore will update F/U and equipment recommendations once testing completed as well as when PT can further evaluate pts deficits.  Will follow acutely.  Pt currently with functional limitations due to the deficits listed below (see PT Problem List). Pt will benefit from skilled PT to increase their independence and safety with mobility to allow discharge to the venue listed below.       Follow Up Recommendations Other (comment) (TBA once testing complete)    Equipment Recommendations  Other (comment) (TBA)    Recommendations for Other Services       Precautions / Restrictions Precautions Precautions: Fall Precaution Comments: EEG in place Restrictions Weight Bearing Restrictions: No      Mobility  Bed Mobility Overal bed mobility: Needs Assistance Bed Mobility: Supine to Sit;Sit to Supine     Supine to sit: Min assist;HOB elevated;+2 for safety/equipment Sit to supine: Min assist;+2 for safety/equipment    General bed mobility comments: Noted pts bed was wet on arrival.  Plan was to sit up and stand to clean bed.  Pt sitting up prior to PT being prepared with lines and needed cues to slow down and allow PT to make sure lines were untangled.  Impulsivity caused pt to need +2.  Pt possibly has some uncoordination at trunk but difficult to assess due to impulsivity. Pt did have to urinate therefore she was moving quickly to try to get OOB.    Transfers Overall transfer level: Needs assistance Equipment used: 2 person hand held assist Transfers: Sit to/from [employer] Sit to Stand: Min assist;+2 safety/equipment Stand pivot transfers: Min assist;+2 safety/equipment       General transfer comment: Obtained 3N1 quickly and assisted pt to the 3n1 with min assist as needed assist for lines as well as for steadying pt as she was impulsive in moving to the 3N1.  Pt did start urinating prior to getting to the 3N1 and had to pull down mesh panties and was upset she was incontinent. Cleaned pt thoroughly and changed gown, socks, and bed while pt was up.  Pt transferred back to bed with very impulsive movement and noted decr coordination of right LE with right lateral lean as well needing min assist to assist pt back to [facility] with pt taking side steps.  Ambulation/Gait             General Gait Details: Deferred pt with EEG  Stairs  Wheelchair Mobility    Modified Rankin (Stroke Patients Only) Modified Rankin (Stroke Patients Only) Pre-Morbid Rankin Score: No symptoms Modified Rankin: Moderately severe disability     Balance Overall balance assessment: Needs assistance Sitting-balance support: Bilateral upper extremity supported;No upper extremity supported;Feet supported Sitting balance-Leahy Scale: Poor Sitting balance - Comments: Needed assist as pt was impulsive.  Pt attempted to place mesh panties and socks on and needed assist due to uncoordination. lines  also in the way with pt seeming to be unaware of lines.   Standing balance support: Bilateral upper extremity supported;During functional activity Standing balance-Leahy Scale: Poor Standing balance comment: Pt needed min assist for safety due to uncoordination of right LE and right postural lean as well as due to lines being in the way.Pt impulsivity hindering steadiness as well.                             Pertinent Vitals/Pain Pain Assessment: Faces Faces Pain Scale: No hurt    Home Living Family/patient expects to be discharged to:: Private residence Living Arrangements: Spouse/significant other Available Help at Discharge: Family;Available 24 hours/day Type of Home: House Home Access: Stairs to enter Entrance Stairs-Rails: Doctor, general practice of Steps: 4 Home Layout: One level Home Equipment: Cane - single point      Prior Function Level of Independence: Independent         Comments: drives and works full time     Higher education careers adviser        Extremity/Trunk Assessment   Upper Extremity Assessment Upper Extremity Assessment: Defer to OT evaluation    Lower Extremity Assessment Lower Extremity Assessment: Generalized weakness;Difficult to assess due to impaired cognition (Difficult to assess as pt not following commands at all times. She was distracted very easily and was not focused on tasks that needed to be taken care of.)    Cervical / Trunk Assessment Cervical / Trunk Assessment: Kyphotic (unsure if this was baseline but pt was flexed slightly anterior in her posture)  Communication   Communication:  (right facial droop from previous surgery)  Cognition Arousal/Alertness: Lethargic Behavior During Therapy: Impulsive;Restless Overall Cognitive Status: Impaired/Different from baseline Area of Impairment: Safety/judgement;Problem solving;Attention                   Current Attention Level: Focused     Safety/Judgement:  Decreased awareness of safety;Decreased awareness of deficits   Problem Solving: Difficulty sequencing;Requires verbal cues;Requires tactile cues General Comments: Pt very impulsive needing incr cues for safety. Pt had to be slowed down as her movements at times were with decr control and PT had to stop her from moving too quickly.  Pt appeared to be unaware of all that was attached to her.  Needed incr cues to keep lines intact.      General Comments      Exercises     Assessment/Plan    PT Assessment Patient needs continued PT services  PT Problem List Decreased balance;Decreased mobility;Decreased activity tolerance;Decreased knowledge of use of DME;Decreased safety awareness;Decreased coordination;Decreased knowledge of precautions       PT Treatment Interventions DME instruction;Gait training;Functional mobility training;Therapeutic activities;Therapeutic exercise;Balance training;Stair training;Patient/family education    PT Goals (Current goals can be found in the Care Plan section)  Acute Rehab PT Goals Patient Stated Goal: to go home PT Goal Formulation: With patient Time For Goal Achievement: 12/02/20 Potential to Achieve Goals: Good    Frequency Min 3X/week   Barriers  to discharge        Co-evaluation               AM-PAC PT "6 Clicks" Mobility  Outcome Measure Help needed turning from your back to your side while in a flat bed without using bedrails?: A Little Help needed moving from lying on your back to sitting on the side of a flat bed without using bedrails?: A Little Help needed moving to and from a bed to a chair (including a wheelchair)?: A Little Help needed standing up from a chair using your arms (e.g., wheelchair or bedside chair)?: A Little Help needed to walk in hospital room?: Total Help needed climbing 3-5 steps with a railing? : Total 6 Click Score: 14    End of Session   Activity Tolerance: Patient limited by lethargy (Impulsivity  limiting pt) Patient left: in bed;with call bell/phone within reach;with bed alarm set;with family/visitor present Nurse Communication: Mobility status (NT made aware to place purewick back on and both nurse and NT aware of how pt transferred with impulsive movements) PT Visit Diagnosis: Muscle weakness (generalized) (M62.81);Unsteadiness on feet (R26.81)    Time: 6962-9528 PT Time Calculation (min) (ACUTE ONLY): 42 min   Charges:   PT Evaluation $PT Eval Moderate Complexity: 1 Mod PT Treatments $Therapeutic Activity: 23-37 mins        Clay Solum M,PT Acute Rehab Services [phone] (pager)   Bevelyn Buckles 11/18/2020, 12:39 PM

## 2020-11-18 NOTE — Progress Notes (Signed)
   11/17/20 2030  Assess: MEWS Score  Temp 98 F (36.7 C)  BP 134/77  Pulse Rate (!) 115  ECG Heart Rate (!) 111  Resp 20  Level of Consciousness Alert  SpO2 96 %  Assess: MEWS Score  MEWS Temp 0  MEWS Systolic 0  MEWS Pulse 2  MEWS RR 0  MEWS LOC 0  MEWS Score 2  MEWS Score Color Yellow  Assess: if the MEWS score is Yellow or Red  Were vital signs taken at a resting state? Yes  Focused Assessment No change from prior assessment  Early Detection of Sepsis Score *See Row Information* Low  Treat  MEWS Interventions Administered scheduled meds/treatments  Pain Scale 0-10  Pain Score 0  Neuro symptoms relieved by Rest  Nausea relieved by Antiemetic  Take Vital Signs  Increase Vital Sign Frequency  Yellow: Q 2hr X 2 then Q 4hr X 2, if remains yellow, continue Q 4hrs  Escalate  MEWS: Escalate Yellow: discuss with charge nurse/RN and consider discussing with provider and RRT  Notify: Charge Nurse/RN  Name of Charge Nurse/RN Notified Nicole, RN  Date Charge Nurse/RN Notified 11/17/20  Time Charge Nurse/RN Notified 2045  Document  Patient Outcome Other (Comment) (stable . MEWS quickly changed to GREEN)  Progress note created (see row info) Yes

## 2020-11-18 NOTE — Progress Notes (Addendum)
Neurology Progress Note   S:// Seen and examined.  Appears more awake than yesterday.  Follows all commands. Overnight EEG concerning for subclinical seizures 2 to 3/h lasting 15 to 45 seconds.  O:// Current vital signs: BP 125/68 (BP Location: Right Arm)   Pulse 88   Temp 98.8 F (37.1 C) (Axillary)   Resp 20   Ht _0  (1.651 m)   Wt 68 kg   SpO2 92%   BMI 24.95 kg/m  Vital signs in last 24 hours: Temp:  [97.8 F (36.6 C)-99.8 F (37.7 C)] 98.8 F (37.1 C) (06/30 0900) Pulse Rate:  [80-121] 88 (06/30 0900) Resp:  [12-29] 20 (06/30 0900) BP: (116-169)/(58-82) 125/68 (06/30 0900) SpO2:  [91 %-100 %] 92 % (06/30 0900) Weight:  [68 kg] 68 kg (06/29 1246) General: Awake alert in no distress HEENT: Normocephalic, right facial weakness-LMN type post sellar gland surgery CVs: Regular rhythm Respiratory: Clear Extremities warm well perfused Neurological exam Awake alert oriented x2 Speech is mildly dysarthric No evidence of aphasia Mildly reduced attention concentration Cranial nerves: Pupils equal round react light, extraocular movements intact, visual fields full, right face LMN palsy-baseline.  Auditory acuity intact. Motor exam: Antigravity in all fours Sensation intact to light touch without extinction on double some to stimulation  Medications  Current Facility-Administered Medications:    0.9 %  sodium chloride infusion, , Intravenous, Continuous, Wynetta Fines T, MD, Last Rate: 125 mL/hr at 11/18/20 0908, Infusion Verify at 11/18/20 9449   acetaminophen (TYLENOL) tablet 1,000 mg, 1,000 mg, Oral, Q6H PRN, Lequita Halt, MD   acyclovir (ZOVIRAX) 680 mg in dextrose 5 % 100 mL IVPB, 10 mg/kg, Intravenous, Q8H, Wynetta Fines T, MD, Stopped at 11/18/20 [phone]   dexamethasone (DECADRON) injection 4 mg, 4 mg, Intravenous, Q6H, Amie Portland, MD, 4 mg at 11/18/20 0500   docusate sodium (COLACE) capsule 100 mg, 100 mg, Oral, Daily PRN, Wynetta Fines T, MD   fosPHENYtoin (CEREBYX)  1,360 mg PE in sodium chloride 0.9 % 50 mL IVPB, 20 mg PE/kg, Intravenous, Once, Amie Portland, MD   levETIRAcetam (KEPPRA) IVPB 1000 mg/100 mL premix, 1,000 mg, Intravenous, Q12H, Amie Portland, MD   LORazepam (ATIVAN) injection 4 mg, 4 mg, Intravenous, Q5 min PRN, Wynetta Fines T, MD   [DISCONTINUED] ondansetron [facility]) tablet 4 mg, 4 mg, Oral, Q6H PRN **OR** ondansetron (ZOFRAN) injection 4 mg, 4 mg, Intravenous, Q6H PRN, Wynetta Fines T, MD, 4 mg at 11/17/20 1947   pantoprazole (PROTONIX) EC tablet 40 mg, 40 mg, Oral, Daily, Zhang, Ping T, MD   PARoxetine (PAXIL) tablet 40 mg, 40 mg, Oral, Daily, Zhang, Ping T, MD   polyvinyl alcohol (LIQUIFILM TEARS) 1.4 % ophthalmic solution 1 drop, 1 drop, Right Eye, Daily PRN, Wynetta Fines T, MD   traZODone (DESYREL) tablet 50 mg, 50 mg, Oral, QHS PRN, Lequita Halt, MD Labs CBC    Component Value Date/Time   WBC 6.6 11/17/2020 1210   RBC 4.70 11/17/2020 1210   HGB 15.3 (H) 11/17/2020 1224   HGB 14.3 07/23/2017 0820   HCT 45.0 11/17/2020 1224   HCT 42.7 07/23/2017 0820   PLT 313 11/17/2020 1210   PLT 240 07/23/2017 0820   MCV 94.5 11/17/2020 1210   MCV 88 07/23/2017 0820   MCH 30.2 11/17/2020 1210   MCHC 32.0 11/17/2020 1210   RDW 13.2 11/17/2020 1210   RDW 13.6 07/23/2017 0820   LYMPHSABS 0.6 (L) 11/17/2020 1210   LYMPHSABS 2.5 07/23/2017 0820   MONOABS 0.3  11/17/2020 1210   EOSABS 0.0 11/17/2020 1210   EOSABS 0.2 07/23/2017 0820   BASOSABS 0.1 11/17/2020 1210   BASOSABS 0.1 07/23/2017 0820    CMP     Component Value Date/Time   NA 135 11/18/2020 0141   NA 140 07/23/2017 0820   K 3.7 11/18/2020 0141   CL 102 11/18/2020 0141   CO2 27 11/18/2020 0141   GLUCOSE 126 (H) 11/18/2020 0141   BUN 5 (L) 11/18/2020 0141   BUN 11 07/23/2017 0820   CREATININE 0.57 11/18/2020 0141   CALCIUM 8.8 (L) 11/18/2020 0141   PROT 7.4 11/17/2020 1210   PROT 7.0 07/23/2017 0820   ALBUMIN 4.1 11/17/2020 1210   ALBUMIN 4.4 07/23/2017 0820   AST 31  11/17/2020 1210   ALT 21 11/17/2020 1210   ALKPHOS 81 11/17/2020 1210   BILITOT 0.3 11/17/2020 1210   BILITOT 0.2 07/23/2017 0820   GFRNONAA >60 11/18/2020 0141   GFRAA >60 08/26/2019 0830    Imaging I have reviewed images in epic and the results pertinent to this consultation are: MRI brain with and without contrast with a 2.4 x 1.4 x 0.8 cm peripherally enhancing focus within the inferior right temporal lobe new when compared to the MRI of February 2021.  Also new from this prior exam has moderate vasogenic edema within the right temporal lobe extending to the right temporal stem.  These findings may reflect necrosis and edema related to prior radiation however a new MAC cannot be excluded. Diffusion weighted signal abnormality within the cortex of the anterior right temporal lobe within the right hippocampus and within the right medial thalamus-nonspecific but primary considerations include HSV encephalitis and seizure related changes or acute ischemia. New from prior MRI of the brain there is subtle asymmetric enhancement along the inferior medial aspect of the right middle cranial fossa which could reflect additional perineural tumor spread along the greater superficial petrosal nerve.  Persistent asymmetric enhancement of the mastoid segment of the right facial nerve, residual perineural tumor at the site cannot be excluded.  Sequela of interval right parotid resection.  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  CT head revealing of a new right temporal lobe vasogenic edema with concerns for metastatic process.  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 Spot EEG with no evidence of ongoing seizures but lateralized  periodic discharges for which she was left on LTM EEG which showed ongoing electrographic seizures. Appreciate neurosurgical evaluation.  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 Recommending MR spectroscopy, which will be discussed with radiology. Recommended neurooncology involved-I have reached out to Dr. Mickeal Skinner for his input. Seizure precautions  -- Amie Portland, MD Neurologist [facility] Pager: [phone]

## 2020-11-18 NOTE — Progress Notes (Addendum)
Re Hook LTM EEG hooked up and running - no initial skin breakdown - push button tested - neuro notified. [facility] monitoring.

## 2020-11-18 NOTE — Progress Notes (Signed)
EEG discontinued for MRI, no skin breakdown was seen; will re-apply when patient gets back to room.

## 2020-11-18 NOTE — Evaluation (Signed)
Clinical/Bedside Swallow Evaluation Patient Details  Name: Melissa Case MRN: [id number] Date of Birth: 08-Oct-1960  Today's Date: 11/18/2020 Time: SLP Start Time (ACUTE ONLY): 1108 SLP Stop Time (ACUTE ONLY): 4196 SLP Time Calculation (min) (ACUTE ONLY): 11 min  Past Medical History:  Past Medical History:  Diagnosis Date   Cancer ([place])    Partoid   Depression    GERD (gastroesophageal reflux disease)    Panic attack    Past Surgical History:  Past Surgical History:  Procedure Laterality Date   ABDOMINAL HYSTERECTOMY     CHOLECYSTECTOMY N/A 11/21/2017   Procedure: LAPAROSCOPIC CHOLECYSTECTOMY WITH INTRAOPERATIVE CHOLANGIOGRAM;  Surgeon: Jovita Kussmaul, MD;  Location: [facility];  Service: General;  Laterality: N/A;   MULTIPLE EXTRACTIONS WITH ALVEOLOPLASTY N/A 08/28/2019   Procedure: Extraction of tooth #'s 3-7, 10-15, and 20-31 with alveoloplatsy, maxillary right buccal exostosis reduction, and bilateral mandibular tori reductions.;  Surgeon: Lenn Cal, DDS;  Location: [place];  Service: Oral Surgery;  Laterality: N/A;   PAROTIDECTOMY Right 07/22/2019   with biopsy; done at [facility] by Dr. Fredricka Bonine   HPI:  Pt is a 60 year old female who presented after an episode of seizure-like activity.  CT head revealing of a new right temporal lobe vasogenic edema with concerns for metastatic process.  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   Assessment / Plan / Recommendation Clinical Impression  Pt presents with mild oral dysphagia likely 2/2 lethargy but with existing oral deficits at baseline as a result of preexisting facial nerve palsy.  Pt has hx of parotid CA with resection and radiation.  Pt edentulous at baseline, but does not have  dentures at present, though she intends to get them per family report.  Family reports pt consumes regular diet at baseline without difficulty.  Pt was very lethargic having just worked with PT/OT, but RN reports drowsiness throughout day.  Pt was able to rouse with cuing and tolerated all consistencies trialed with no clinical s/s of aspiration and exhibited good oral clearance of puree and soft solids.  With regular solid, pt was unable to bite cracker, but had greater success with spoon presentation of softened cracker.  Recommend mechanical soft diet with thin liquids when pt is fully awake/alert.  Pt may not consume much 2/2 lethargy, but does appear safe to eat/drink when awake.  Please provide 1:1 assistance with meals and reorient pt to task.  SLP to follow for diet tolerance and potential advancement.   SLP Visit Diagnosis: Dysphagia, oral phase (R13.11)    Aspiration Risk  Mild aspiration risk    Diet Recommendation Dysphagia 3 (Mech soft);Thin liquid   Liquid Administration via: Cup Medication Administration: Crushed with puree Supervision: Staff to assist with self feeding (1:1 assist; make sure pt is alert) Compensations: Slow rate;Small sips/bites (Check oral cavity for residue if drowsy)    Other  Recommendations Oral Care Recommendations: Oral care BID   Follow up Recommendations  (TBD)      Frequency and Duration min 2x/week  2 weeks       Prognosis Prognosis for Safe Diet Advancement: Good      Swallow Study   General Date of Onset: 11/17/20 HPI: Pt is a 60 year old female who presented after an episode of seizure-like activity.  CT head revealing of  a new right temporal lobe vasogenic edema with concerns for metastatic process.  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 Type of Study: Bedside Swallow Evaluation Previous Swallow Assessment: none Diet Prior to this Study: NPO Temperature Spikes Noted: No Respiratory Status: Room air History of Recent Intubation: No Behavior/Cognition: Lethargic/Drowsy;Requires cueing Oral Cavity Assessment: Within Functional Limits Oral Care Completed by SLP: No Oral Cavity - Dentition: Edentulous Self-Feeding Abilities: Total assist Patient Positioning: Upright in bed Baseline Vocal Quality: Low vocal intensity Volitional Cough: Weak Volitional Swallow: Able to elicit    Oral/Motor/Sensory Function Overall Oral Motor/Sensory Function: Moderate impairment Facial ROM: Reduced right Facial Symmetry: Abnormal symmetry right Facial Strength: Reduced right Lingual Symmetry: Within Functional Limits Lingual Strength: Reduced Velum:  (could not test) Mandible:  (reduced, likely 2/2 drowsiness)   Ice Chips Ice chips: Not tested   Thin Liquid Thin Liquid: Within functional limits Presentation: Spoon;Cup    Nectar Thick Nectar Thick Liquid: Not tested   Honey Thick Honey Thick Liquid: Not tested   Puree Puree: Within functional limits Presentation: Spoon   Solid     Solid: Impaired Presentation: Spoon Oral Phase Impairments: Impaired mastication;Poor awareness of bolus Oral Phase Functional Implications: Prolonged oral transit      Celedonio Savage, MA, [place] Office: [phone] 11/18/2020,11:52 AM

## 2020-11-18 NOTE — Procedures (Addendum)
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 EEG to evaluate for seizure   Level of alertness: Awake, asleep   AEDs during EEG study: LEV, PHT   Technical aspects: This EEG study was done with scalp electrodes positioned according to the 10-20 International system of electrode placement. Electrical activity was acquired at a sampling rate of 500Hz  and reviewed with a high frequency filter of 70Hz  and a low frequency filter of 1Hz . EEG data were recorded continuously and digitally stored.   Description: No posterior dominant rhythm was seen. EEG showed continuous generalized and lateralized polymorphic sharply contoured 5 to 9 Hz theta-alpha activity. Continuous 2-3hz  delta slowing was also noted in right temporo-parietal region. Lateralized periodic discharges were noted in right hemisphere, maximal ight temporo-parietal region, at 0.5-1hz  with superimposed rhythmicity at times.  Seizures without clinical signs were also noted arising from right temporoparietal region, average 2-3 seizures per hour, lasting about 15 to 45 seconds each.    EEG was interrupted between 11/18/2020 1142 to 1949 for procedures.  After 1949, EEG continued to show lateralized periodic discharges in right hemisphere, maximal right temporoparietal region.  Brief ictal-interictal rhythmic discharges were also noted in right temporoparietal region.   ABNORMALITY -Seizure without clinical signs, right temporoparietal region -Brief ictal-interictal rhythmic discharges, right temporoparietal region (BIRDs) - Lateralized periodic discharges with overriding  rhythmic activity ( LPD +) right, maximal temporo-parietal region - Continuous slow, generalized and maximal right temporo-parietal region.   IMPRESSION: This study showed seizures without clinical signs arising from right temporoparietal region, average 2- 3/hour, lasting about 15 to 45 seconds each.  After 1949 on 11/18/2020, EEG showed brief ictal-interictal rhythmic discharges arising from right temporoparietal region in addition to lateralized periodic discharges with overriding rhythmic activity which are on the ictal-interictal continuum with high potential for seizures.  There is also evidence of cortical dysfunction arising from right temporo-parietal region likely secondary to underlying structural abnormality/ mass.  Additionally, there is moderate diffuse encephalopathy, nonspecific etiology.    Sonal Dorwart [name]

## 2020-11-18 NOTE — Progress Notes (Signed)
PROGRESS NOTE                                                                                                                                                                                                             Patient Demographics:    Melissa Case, is a 60 y.o. female, DOB - 06/04/1960, HUD:[id number]  Outpatient Primary MD for the patient is Patient, No Pcp Per (Inactive)    LOS - 1  Admit date - 11/17/2020    Chief Complaint  Patient presents with   Altered Mental Status       Brief Narrative (HPI from H&P)  60 year old with past history of right parotid salivary gland ductal carcinoma s/p surgery and postoperative right facial nerve palsy, presenting after an episode of seizure-like activity   Subjective:    Melissa Case today has, No headache, No chest pain, No abdominal pain - No Nausea, No new weakness tingling or numbness, no SOB.   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  She has history of salivary gland carcinoma in the past, she is being followed by neurology, neurosurgery and neuro-oncology.  Currently on steroids and Keppra along with fosphenytoin.  EEG still positive for seizures on 11/18/2020, neurology will adjust seizure medications, also will undergo LP.  She will also be taken to the OR for brain biopsy.  We will continue to monitor closely.  Overall guarded prognosis.  Currently n.p.o. with gentle IV fluids.  PT OT and speech to follow.  2.  History of right-sided Bell's palsy.  Supportive care.  3.  GERD.  On PPI.  4. HX of anxiety and depression.  Paxil once she is able to take oral medications      Condition - Extremely Guarded  Family Communication  :  daughter bedside  Code Status :  Full  Consults  :  Neuro, N.Surg, Onco  PUD Prophylaxis :  PPI   Procedures  :     MRI - 2.4 x 1.4 x 0.8 cm peripherally enhancing focus within the inferior right temporal lobe, new as compared to the brain MRI of 06/25/2019. Also new from this prior exam, there is moderate vasogenic edema within the right temporal lobe, extending  to the right temporal stem. These findings may reflect necrosis and edema related to prior radiation therapy. However, a right temporal lobe metastasis cannot be excluded. Neuro-Oncology consultation is recommended. Diffusion-weighted signal abnormality within the cortex of the anterior right temporal lobe, within the right hippocampus and within the medial right thalamus. Findings are nonspecific. Primary considerations are encephalitis (including herpes encephalitis), seizure-related changes or acute ischemia. Sequela of interval right parotid resection. Persistent asymmetric enhancement of the mastoid segment of the right facial nerve. Residual perineural tumor at this site cannot be excluded. New from the prior brain MRI, there is subtle asymmetric enhancement along the inferomedial aspect of the right middle cranial fossa, which could reflect additional perineural tumor spread (along the greater superficial petrosal nerve). Mild chronic small vessel ischemic changes within the cerebral white matter and pons. Large right mastoid effusion. Large right maxillary sinus mucous retention cyst. Electronically Signed   By: Kellie Simmering DO   On: 11/17/2020 17:57       Disposition Plan  :    Status is: Inpatient  Remains inpatient appropriate because:IV treatments appropriate due to intensity of illness or inability to take PO  Dispo: The patient is from: Home              Anticipated d/c is to: SNF              Patient currently is not medically stable to d/c.   Difficult to place patient No  DVT Prophylaxis  :  SCDs    Lab Results  Component Value Date   PLT 313 11/17/2020    Diet :  Diet Order             DIET DYS 3  Room service appropriate? Yes; Fluid consistency: Thin  Diet effective now                    Inpatient Medications  Scheduled Meds:  dexamethasone (DECADRON) injection  4 mg Intravenous Q6H   pantoprazole  40 mg Oral Daily   PARoxetine  40 mg Oral Daily   phenytoin (DILANTIN) IV  100 mg Intravenous Q8H   Continuous Infusions:  sodium chloride 125 mL/hr at 11/18/20 0908   acyclovir 680 mg (11/18/20 1036)   levETIRAcetam     PRN Meds:.acetaminophen, docusate sodium, LORazepam, [DISCONTINUED] ondansetron **OR** ondansetron (ZOFRAN) IV, polyvinyl alcohol, traZODone  Antibiotics  :    Anti-infectives (From admission, onward)    Start     Dose/Rate Route Frequency Ordered Stop   11/17/20 1930  acyclovir (ZOVIRAX) 680 mg in dextrose 5 % 100 mL IVPB        10 mg/kg  68 kg 113.6 mL/hr over 60 Minutes Intravenous Every 8 hours 11/17/20 1857          Time Spent in minutes  30   Lala Lund M.D on 11/18/2020 at 12:24 PM  To page go to [email or web address]   [facility] -  Office  [phone]    See all Orders from today for further details    Objective:   Vitals:   11/18/20 0500 11/18/20 0600 11/18/20 0900 11/18/20 1100  BP: (!) 134/58 126/69 125/68 118/61  Pulse: 99 94 88 90  Resp: 18 (!) 23 20 18   Temp:   98.8 F (37.1 C) 98.4 F (36.9 C)  TempSrc:   Axillary Oral  SpO2: 94% 92% 92% 98%  Weight:      Height:        Wt Readings from  Last 3 Encounters:  11/17/20 68 kg  10/26/20 68 kg  05/28/20 68.5 kg     Intake/Output Summary (Last 24 hours) at 11/18/2020 1224 Last data filed at 11/18/2020 0908 Gross per 24 hour  Intake 3202.97 ml  Output 700 ml  Net 2502.97 ml     Physical Exam  Awake Alert, No new F.N deficits, R- facial droop [place].AT,PERRAL Supple Neck,No JVD, No cervical lymphadenopathy appriciated.  Symmetrical Chest wall movement, Good air movement bilaterally, CTAB RRR,No Gallops,Rubs or new Murmurs, No Parasternal Heave +ve  B.Sounds, Abd Soft, No tenderness, No organomegaly appriciated, No rebound - guarding or rigidity. No Cyanosis, Clubbing or edema, No new Rash or bruise        Data Review:    CBC Recent Labs  Lab 11/17/20 1210 11/17/20 1224  WBC 6.6  --   HGB 14.2 15.3*  HCT 44.4 45.0  PLT 313  --   MCV 94.5  --   MCH 30.2  --   MCHC 32.0  --   RDW 13.2  --   LYMPHSABS 0.6*  --   MONOABS 0.3  --   EOSABS 0.0  --   BASOSABS 0.1  --     Recent Labs  Lab 11/17/20 1210 11/17/20 1219 11/17/20 1224 11/17/20 1416 11/18/20 0141 11/18/20 0840  NA 136  --  138  --  135  --   K 4.2  --  4.2  --  3.7  --   CL 102  --  103  --  102  --   CO2 22  --   --   --  27  --   GLUCOSE 197*  --  198*  --  126*  --   BUN 12  --  15  --  5*  --   CREATININE 0.73  --  0.60  --  0.57  --   CALCIUM 9.1  --   --   --  8.8*  --   AST 31  --   --   --   --   --   ALT 21  --   --   --   --   --   ALKPHOS 81  --   --   --   --   --   BILITOT 0.3  --   --   --   --   --   ALBUMIN 4.1  --   --   --   --   --   LATICACIDVEN  --  4.5*  --  1.3  --   --   INR 0.9  --   --   --   --  1.0  HGBA1C 5.8*  --   --   --   --   --     ------------------------------------------------------------------------------------------------------------------ No results for input(s): CHOL, HDL, LDLCALC, TRIG, CHOLHDL, LDLDIRECT in the last 72 hours.  Lab Results  Component Value Date   HGBA1C 5.8 (H) 11/17/2020   ------------------------------------------------------------------------------------------------------------------ No results for input(s): TSH, T4TOTAL, T3FREE, THYROIDAB in the last 72 hours.  Invalid input(s): FREET3  Cardiac Enzymes No results for input(s): CKMB, TROPONINI, MYOGLOBIN in the last 168 hours.  Invalid input(s): CK ------------------------------------------------------------------------------------------------------------------ No results found for: BNP  Micro Results Recent Results (from the  past 240 hour(s))  Resp Panel by RT-PCR (Flu A&B, Covid) Nasopharyngeal Swab     Status: None   Collection Time: 11/17/20 12:15 PM   Specimen: Nasopharyngeal Swab; Nasopharyngeal(NP) swabs in  vial transport medium  Result Value Ref Range Status   SARS Coronavirus 2 by RT PCR NEGATIVE NEGATIVE Final    Comment: (NOTE) SARS-CoV-2 target nucleic acids are NOT DETECTED.  The SARS-CoV-2 RNA is generally detectable in upper respiratory specimens during the acute phase of infection. The lowest concentration of SARS-CoV-2 viral copies this assay can detect is 138 copies/mL. A negative result does not preclude SARS-Cov-2 infection and should not be used as the sole basis for treatment or other patient management decisions. A negative result may occur with  improper specimen collection/handling, submission of specimen other than nasopharyngeal swab, presence of viral mutation(s) within the areas targeted by this assay, and inadequate number of viral copies(<138 copies/mL). A negative result must be combined with clinical observations, patient history, and epidemiological information. The expected result is Negative.  Fact Sheet for Patients:  EntrepreneurPulse.com.au  Fact Sheet for Healthcare Providers:  IncredibleEmployment.be  This test is no t yet approved or cleared by the [place] FDA and  has been authorized for detection and/or diagnosis of SARS-CoV-2 by FDA under an Emergency Use Authorization (EUA). This EUA will remain  in effect (meaning this test can be used) for the duration of the COVID-19 declaration under Section 564(b)(1) of the Act, 21 U.S.C.section 360bbb-3(b)(1), unless the authorization is terminated  or revoked sooner.       Influenza A by PCR NEGATIVE NEGATIVE Final   Influenza B by PCR NEGATIVE NEGATIVE Final    Comment: (NOTE) The Xpert Xpress SARS-CoV-2/FLU/RSV plus assay is intended as an aid in the diagnosis of  influenza from Nasopharyngeal swab specimens and should not be used as a sole basis for treatment. Nasal washings and aspirates are unacceptable for Xpert Xpress SARS-CoV-2/FLU/RSV testing.  Fact Sheet for Patients: EntrepreneurPulse.com.au  Fact Sheet for Healthcare Providers: IncredibleEmployment.be  This test is not yet approved or cleared by the [place] FDA and has been authorized for detection and/or diagnosis of SARS-CoV-2 by FDA under an Emergency Use Authorization (EUA). This EUA will remain in effect (meaning this test can be used) for the duration of the COVID-19 declaration under Section 564(b)(1) of the Act, 21 U.S.C. section 360bbb-3(b)(1), unless the authorization is terminated or revoked.  Performed at [facility], [place] [address], [place], [place] [address]     Radiology Reports CT HEAD WO CONTRAST  Result Date: 11/17/2020 CLINICAL DATA:  Altered mental status following a syncopal episode. Seizure-like activity. Possible stroke. History of right-sided parotid carcinoma. EXAM: CT HEAD WITHOUT CONTRAST TECHNIQUE: Contiguous axial images were obtained from the base of the skull through the vertex without intravenous contrast. COMPARISON:  Head MRI 06/25/2019 FINDINGS: Brain: There is a new moderate-sized region of vasogenic edema involving right temporal lobe white matter. There are a few small foci of calcification along the inferior aspect of the right temporal lobe which may be associated with some abnormal soft tissue in this region, however a discrete mass is not well demonstrated on this unenhanced study. There is mild local mass effect without midline shift. No acute cortically based infarct, intracranial hemorrhage, or extra-axial fluid collection is identified. The ventricles are normal in size. Vascular: Calcified atherosclerosis at the skull base. No hyperdense vessel. Skull: No fracture or suspicious osseous lesion.  Sinuses/Orbits: Partially visualized mucous retention cysts in the right maxillary sinus. New moderate-sized right mastoid effusion. Right-sided eyelid weight. Other: Partially visualized sequelae of right parotidectomy. IMPRESSION: 1. New moderate-sized region of vasogenic edema in the right temporal lobe, suspicious for metastatic  disease with this history. Brain MRI without and with contrast is recommended for further evaluation. 2. New right mastoid effusion. These results were called by telephone at the time of interpretation on 11/17/2020 at 12:50 pm to Dr. Sherwood Gambler, who verbally acknowledged these results. Electronically Signed   By: Logan Bores M.D.   On: 11/17/2020 12:51   MR Brain W and Wo Contrast  Result Date: 11/17/2020 CLINICAL DATA:  Provided history: Brain mass or lesion. EXAM: MRI HEAD WITHOUT AND WITH CONTRAST TECHNIQUE: Multiplanar, multiecho pulse sequences of the brain and surrounding structures were obtained without and with intravenous contrast. CONTRAST:  28mL GADAVIST GADOBUTROL 1 MMOL/ML IV SOLN COMPARISON:  Prior head CT examinations 11/17/2020 and earlier. Brain MRI 06/25/2019. FINDINGS: Brain: Mild intermittent motion degradation. New from the prior brain MRI of 06/25/2019, there is a 2.4 x 1.4 x 0.8 cm peripherally enhancing focus within the inferior right temporal lobe (for instance as seen on series 11, image 18) (series 9, image 15). Also new from this prior examination, there is a moderate-sized focus of vasogenic edema within the right temporal lobe extending to the right temporal stem. Diffusion-weighted signal abnormality is present within the cortex of the anterior right temporal lobe, within the right hippocampus and within the right temporal stem. Persistent asymmetric enhancement along the mastoid segment of the right facial nerve (for instance as seen on series 11, image 12). New from the prior MRI, there is asymmetric enhancement along the inferomedial aspect of  the right middle cranial fossa (series 11, image 15). Mild multifocal T2/FLAIR hyperintensity elsewhere within the cerebral white matter and within the pons, nonspecific but compatible with chronic small vessel ischemic disease. No chronic intracranial blood products. No extra-axial fluid collection. No midline shift. Vascular: Expected proximal arterial flow voids. Skull and upper cervical spine: No focal marrow lesion. Sinuses/Orbits: Visualized orbits show no acute finding. 2.8 cm right maxillary sinus mucous retention cyst. Other: Large right mastoid effusion. Sequela of interval right parotid resection. These results were called by telephone at the time of interpretation on 11/17/2020 at 5:47 pm to provider Dr.Zhang, who verbally acknowledged these results. IMPRESSION: 2.4 x 1.4 x 0.8 cm peripherally enhancing focus within the inferior right temporal lobe, new as compared to the brain MRI of 06/25/2019. Also new from this prior exam, there is moderate vasogenic edema within the right temporal lobe, extending to the right temporal stem. These findings may reflect necrosis and edema related to prior radiation therapy. However, a right temporal lobe metastasis cannot be excluded. Neuro-Oncology consultation is recommended. Diffusion-weighted signal abnormality within the cortex of the anterior right temporal lobe, within the right hippocampus and within the medial right thalamus. Findings are nonspecific. Primary considerations are encephalitis (including herpes encephalitis), seizure-related changes or acute ischemia. Sequela of interval right parotid resection. Persistent asymmetric enhancement of the mastoid segment of the right facial nerve. Residual perineural tumor at this site cannot be excluded. New from the prior brain MRI, there is subtle asymmetric enhancement along the inferomedial aspect of the right middle cranial fossa, which could reflect additional perineural tumor spread (along the greater  superficial petrosal nerve). Mild chronic small vessel ischemic changes within the cerebral white matter and pons. Large right mastoid effusion. Large right maxillary sinus mucous retention cyst. 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 EEG to evaluate for seizure Level of alertness: Awake AEDs during EEG study: LEV Technical aspects: This EEG study was done with scalp electrodes positioned according to the 10-20 International system of electrode placement. Electrical activity was acquired at a sampling rate of 500Hz  and reviewed with a high frequency filter of 70Hz  and a low frequency filter of 1Hz . EEG data were recorded continuously and digitally stored. Description: No posterior dominant rhythm was seen. EEG showed continuous generalized and lateralized polymorphic sharply contoured 5 to 9 Hz theta-alpha activity. Continuous 2-3hz  delta slowing was also noted in right temporo-parietal region. Lateralized periodic discharges were noted in right hemisphere, maximal ight temporo-parietal region, at 1hz  with superimposed rhythmicity at times.  Hyperventilation and photic stimulation were not performed.   ABNORMALITY - Lateralized periodic discharges with overriding rhythmic activity ( LPD +) right, maximal temporo-parietal region - Continuous slow, generalized and maximal right temporo-parietal region. IMPRESSION: This study howed evidence of epileptogenicity as well as cortical dysfunction arising from right temporo-parietal  region likely secondary to underlying structural abnormality/ mass. The lateralized periodic discharges with overriding rhythmic activity are on the ictal-interictal continuum with high potential for seizures, There is also moderate diffuse encephalopathy, nonspecific etiology. No seizures were seen throughout the recording. Dr Rory Percy was notified. 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 EEG to evaluate for seizure  Level of alertness: Awake, asleep  AEDs during EEG study: LEV  Technical aspects: This EEG study was done with scalp electrodes positioned according to the 10-20 International system of electrode placement. Electrical activity was acquired at a sampling rate of 500Hz  and reviewed with a high frequency filter of 70Hz  and a low frequency filter of 1Hz . EEG data were recorded continuously and digitally stored.  Description: No posterior dominant rhythm was seen. EEG showed continuous generalized and lateralized polymorphic sharply contoured 5 to 9 Hz theta-alpha activity. Continuous 2-3hz  delta slowing was also noted in right temporo-parietal region. Lateralized periodic discharges were noted in right hemisphere, maximal ight temporo-parietal region, at 0.5-1hz  with superimposed rhythmicity at times.  Seizures without clinical signs were also noted arising from right temporoparietal region, average 2-3 seizures per hour, lasting about 15 to 45 seconds each.  Hyperventilation and photic stimulation were not performed.    ABNORMALITY -Seizure  without clinical signs, right temporoparietal region - Lateralized periodic discharges with overriding rhythmic activity ( LPD +) right, maximal temporo-parietal region - Continuous slow, generalized and maximal right temporo-parietal region.  IMPRESSION: This study showed seizures without clinical signs arising from right temporoparietal region, average 2- 3/hour, lasting about 15 to 45 seconds each.  There is also evidence of cortical dysfunction arising from right temporo-parietal region likely secondary to underlying structural abnormality/ mass.  Additionally, there is moderate diffuse encephalopathy, nonspecific etiology.  Priyanka [name]

## 2020-11-19 ENCOUNTER — Inpatient Hospital Stay (HOSPITAL_COMMUNITY): Payer: PRIVATE HEALTH INSURANCE

## 2020-11-19 DIAGNOSIS — G9389 Other specified disorders of brain: Secondary | ICD-10-CM

## 2020-11-19 LAB — CSF CELL COUNT WITH DIFFERENTIAL
RBC Count, CSF: 178 /mm3 — ABNORMAL HIGH
Tube #: 1
WBC, CSF: 3 /mm3 (ref 0–5)

## 2020-11-19 LAB — CBC WITH DIFFERENTIAL/PLATELET
Abs Immature Granulocytes: 0.04 10*3/uL (ref 0.00–0.07)
Basophils Absolute: 0 10*3/uL (ref 0.0–0.1)
Basophils Relative: 0 %
Eosinophils Absolute: 0 10*3/uL (ref 0.0–0.5)
Eosinophils Relative: 0 %
HCT: 38 % (ref 36.0–46.0)
Hemoglobin: 12.3 g/dL (ref 12.0–15.0)
Immature Granulocytes: 0 %
Lymphocytes Relative: 10 %
Lymphs Abs: 1 10*3/uL (ref 0.7–4.0)
MCH: 29.9 pg (ref 26.0–34.0)
MCHC: 32.4 g/dL (ref 30.0–36.0)
MCV: 92.2 fL (ref 80.0–100.0)
Monocytes Absolute: 0.6 10*3/uL (ref 0.1–1.0)
Monocytes Relative: 6 %
Neutro Abs: 8.4 10*3/uL — ABNORMAL HIGH (ref 1.7–7.7)
Neutrophils Relative %: 84 %
Platelets: 255 10*3/uL (ref 150–400)
RBC: 4.12 MIL/uL (ref 3.87–5.11)
RDW: 13.2 % (ref 11.5–15.5)
WBC: 10.1 10*3/uL (ref 4.0–10.5)
nRBC: 0 % (ref 0.0–0.2)

## 2020-11-19 LAB — COMPREHENSIVE METABOLIC PANEL
ALT: 16 U/L (ref 0–44)
AST: 19 U/L (ref 15–41)
Albumin: 3.5 g/dL (ref 3.5–5.0)
Alkaline Phosphatase: 61 U/L (ref 38–126)
Anion gap: 6 (ref 5–15)
BUN: 6 mg/dL (ref 6–20)
CO2: 30 mmol/L (ref 22–32)
Calcium: 8.8 mg/dL — ABNORMAL LOW (ref 8.9–10.3)
Chloride: 102 mmol/L (ref 98–111)
Creatinine, Ser: 0.63 mg/dL (ref 0.44–1.00)
GFR, Estimated: >60 mL/min (ref >60)
Glucose, Bld: 101 mg/dL — ABNORMAL HIGH (ref 70–99)
Potassium: 3.5 mmol/L (ref 3.5–5.1)
Sodium: 138 mmol/L (ref 135–145)
Total Bilirubin: 0.6 mg/dL (ref 0.3–1.2)
Total Protein: 6.3 g/dL — ABNORMAL LOW (ref 6.5–8.1)

## 2020-11-19 LAB — PROTEIN, CSF: Total  Protein, CSF: 30 mg/dL (ref 15–45)

## 2020-11-19 LAB — MAGNESIUM: Magnesium: 2.1 mg/dL (ref 1.7–2.4)

## 2020-11-19 LAB — BRAIN NATRIURETIC PEPTIDE: B Natriuretic Peptide: 88.5 pg/mL (ref 0.0–100.0)

## 2020-11-19 LAB — GLUCOSE, CSF: Glucose, CSF: 69 mg/dL (ref 40–70)

## 2020-11-19 MED ORDER — POTASSIUM CHLORIDE 2 MEQ/ML IV SOLN
INTRAVENOUS | Status: AC
  Filled 2020-11-19 (×3): qty 1000

## 2020-11-19 MED ORDER — LIDOCAINE HCL (PF) 1 % IJ SOLN
15.0000 mL | Freq: Once | INTRAMUSCULAR | Status: AC
  Administered 2020-11-19: 15 mL via INTRADERMAL
  Filled 2020-11-19: qty 30

## 2020-11-19 NOTE — Progress Notes (Signed)
   Providing Compassionate, Quality Care - Together  NEUROSURGERY PROGRESS NOTE   S: No issues overnight. EEG in place  O: EXAM:  BP 127/72 (BP Location: Left Arm)   Pulse 78   Temp 99.2 F (37.3 C) (Oral)   Resp 16   Ht 5\' 5"  (1.651 m)   Wt 68 kg   SpO2 96%   BMI 24.95 kg/m   EEG in place Sleeping, wakes up and converses appropriately PERRLA Right-sided chronic facial droop EOMI Moves all extremities equally, full strength No drift     Impression/Assessment:  60 year old female with   Right temporal enhancing lesion, questionable etiology (radiation necrosis versus metastatic spread versus infectious etiology) Seizures   Plan:  -CT and MR spect reviewed.  There is increased lipid/lactate consistent with necrosis as well as slight elevation of cr/cho.  At this time differential diagnosis is favoring radiation necrosis -Patient is undergoing an LP today for infectious work-up.  I agree with this plan.  We will follow results of this, pending patient may need a biopsy.  CT chest abdomen pelvis reviewed. -Continue antiepileptic therapies -Supportive care      Thank you for allowing me to participate in this patient's care.  Please do not hesitate to call with questions or concerns.   Elwin Sleight, [facility] Cell: [phone]

## 2020-11-19 NOTE — Progress Notes (Signed)
PROGRESS NOTE                                                                                                                                                                                                             Patient Demographics:    Melissa Case, is a 60 y.o. 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 No new focal weakness.   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  She has history of salivary gland carcinoma in the past, she is being followed by neurology, neurosurgery and neuro-oncology.  Currently on steroids and Keppra along with fosphenytoin.  EEG still positive for seizures on 11/18/2020, neurology will adjust seizure medications, bedside attempt on LP by neurology on 11/19/2020 failed, she is due for fluoroscopy guided LP later today.  She will also be taken to the OR by neurosurgery for brain biopsy.  We will continue to monitor closely.  Overall guarded prognosis.  Continue supportive care.  PT OT and speech t following.  2.  History of right-sided Bell's palsy.  Supportive care.  3.  GERD.  On PPI.  4. HX of anxiety and depression.  Paxil once she is able to take oral medications      Condition - Extremely Guarded  Family  Communication  :  daughter bedside 11/18/20, 11/19/20,    Code Status :  Full  Consults  :  Neuro, N.Surg, N.Onco, IR  PUD Prophylaxis : PPI   Procedures  :     MRI - 2.4 x 1.4 x 0.8 cm peripherally enhancing focus within the inferior right temporal lobe, new as compared to the brain  MRI of 06/25/2019. Also new from this prior exam, there is moderate vasogenic edema within the right temporal lobe, extending to the right temporal stem. These findings may reflect necrosis and edema related to prior radiation therapy. However, a right temporal lobe metastasis cannot be excluded. Neuro-Oncology consultation is recommended. Diffusion-weighted signal abnormality within the cortex of the anterior right temporal lobe, within the right hippocampus and within the medial right thalamus. Findings are nonspecific. Primary considerations are encephalitis (including herpes encephalitis), seizure-related changes or acute ischemia. Sequela of interval right parotid resection. Persistent asymmetric enhancement of the mastoid segment of the right facial nerve. Residual perineural tumor at this site cannot be excluded. New from the prior brain MRI, there is subtle asymmetric enhancement along the inferomedial aspect of the right middle cranial fossa, which could reflect additional perineural tumor spread (along the greater superficial petrosal nerve). Mild chronic small vessel ischemic changes within the cerebral white matter and pons. Large right mastoid effusion. Large right maxillary sinus mucous retention cyst. Electronically Signed   By: Kellie Simmering DO   On: 11/17/2020 17:57       Disposition Plan  :    Status is: Inpatient  Remains inpatient appropriate because:IV treatments appropriate due to intensity of illness or inability to take PO  Dispo: The patient is from: Home              Anticipated d/c is to: SNF              Patient currently is not medically stable to d/c.   Difficult to place patient  No  DVT Prophylaxis  :  SCDs  Place and maintain sequential compression device Start: 11/18/20 1226  Lab Results  Component Value Date   PLT 255 11/19/2020    Diet :  Diet Order             DIET DYS 3 Room service appropriate? Yes; Fluid consistency: Thin  Diet effective now                    Inpatient Medications  Scheduled Meds:  dexamethasone (DECADRON) injection  4 mg Intravenous Q6H   pantoprazole  40 mg Oral Daily   PARoxetine  40 mg Oral Daily   phenytoin (DILANTIN) IV  100 mg Intravenous Q8H   Continuous Infusions:  acyclovir 114 mL/hr at 11/19/20 0400   lactated ringers with kcl 75 mL/hr at 11/19/20 0939   levETIRAcetam Stopped (11/19/20 0100)   PRN Meds:.acetaminophen, docusate sodium, LORazepam, [DISCONTINUED] ondansetron **OR** ondansetron (ZOFRAN) IV, polyvinyl alcohol, traZODone  Antibiotics  :    Anti-infectives (From admission, onward)    Start     Dose/Rate Route Frequency Ordered Stop   11/17/20 1930  acyclovir (ZOVIRAX) 680 mg in dextrose 5 % 100 mL IVPB        10 mg/kg  68 kg 113.6 mL/hr over 60 Minutes Intravenous Every 8 hours 11/17/20 1857          Time Spent in minutes  30   Lala Lund M.D on 11/19/2020 at 10:01 AM  To page go to [email or web address]   [facility] -  Office  [phone]    See all Orders from today for further details    Objective:   Vitals:   11/19/20 0039 11/19/20 0200 11/19/20 0400 11/19/20 0755  BP: (!) 109/58 98/69 (!) 108/53 (!) 115/59  Pulse:  86 87 83  Resp:  19 20 17   Temp:   98.3 F (36.8 C) 99.6  F (37.6 C)  TempSrc:   Oral Oral  SpO2:  95% 95% 96%  Weight:      Height:        Wt Readings from Last 3 Encounters:  11/17/20 68 kg  10/26/20 68 kg  05/28/20 68.5 kg     Intake/Output Summary (Last 24 hours) at 11/19/2020 1001 Last data filed at 11/19/2020 0535 Gross per 24 hour  Intake 1298.08 ml  Output 1200 ml  Net 98.08 ml     Physical Exam  Awake Alert, No new F.N  deficits, R- facial droop [place].AT,PERRAL Supple Neck,No JVD, No cervical lymphadenopathy appriciated.  Symmetrical Chest wall movement, Good air movement bilaterally, CTAB RRR,No Gallops, Rubs or new Murmurs, No Parasternal Heave +ve B.Sounds, Abd Soft, No tenderness, No organomegaly appriciated, No rebound - guarding or rigidity. No Cyanosis, Clubbing or edema, No new Rash or bruise        Data Review:    CBC Recent Labs  Lab 11/17/20 1210 11/17/20 1224 11/19/20 0048  WBC 6.6  --  10.1  HGB 14.2 15.3* 12.3  HCT 44.4 45.0 38.0  PLT 313  --  255  MCV 94.5  --  92.2  MCH 30.2  --  29.9  MCHC 32.0  --  32.4  RDW 13.2  --  13.2  LYMPHSABS 0.6*  --  1.0  MONOABS 0.3  --  0.6  EOSABS 0.0  --  0.0  BASOSABS 0.1  --  0.0    Recent Labs  Lab 11/17/20 1210 11/17/20 1219 11/17/20 1224 11/17/20 1416 11/18/20 0141 11/18/20 0840 11/19/20 0048  NA 136  --  138  --  135  --  138  K 4.2  --  4.2  --  3.7  --  3.5  CL 102  --  103  --  102  --  102  CO2 22  --   --   --  27  --  30  GLUCOSE 197*  --  198*  --  126*  --  101*  BUN 12  --  15  --  5*  --  6  CREATININE 0.73  --  0.60  --  0.57  --  0.63  CALCIUM 9.1  --   --   --  8.8*  --  8.8*  AST 31  --   --   --   --   --  19  ALT 21  --   --   --   --   --  16  ALKPHOS 81  --   --   --   --   --  61  BILITOT 0.3  --   --   --   --   --  0.6  ALBUMIN 4.1  --   --   --   --   --  3.5  MG  --   --   --   --   --   --  2.1  LATICACIDVEN  --  4.5*  --  1.3  --   --   --   INR 0.9  --   --   --   --  1.0  --   HGBA1C 5.8*  --   --   --   --   --   --   BNP  --   --   --   --   --   --  88.5    ------------------------------------------------------------------------------------------------------------------ No  results for input(s): CHOL, HDL, LDLCALC, TRIG, CHOLHDL, LDLDIRECT in the last 72 hours.  Lab Results  Component Value Date   HGBA1C 5.8 (H) 11/17/2020    ------------------------------------------------------------------------------------------------------------------ No results for input(s): TSH, T4TOTAL, T3FREE, THYROIDAB in the last 72 hours.  Invalid input(s): FREET3  Cardiac Enzymes No results for input(s): CKMB, TROPONINI, MYOGLOBIN in the last 168 hours.  Invalid input(s): CK ------------------------------------------------------------------------------------------------------------------    Component Value Date/Time   BNP 88.5 11/19/2020 0048    Micro Results Recent Results (from the past 240 hour(s))  Resp Panel by RT-PCR (Flu A&B, Covid) Nasopharyngeal Swab     Status: None   Collection Time: 11/17/20 12:15 PM   Specimen: Nasopharyngeal Swab; Nasopharyngeal(NP) swabs in vial transport medium  Result Value Ref Range Status   SARS Coronavirus 2 by RT PCR NEGATIVE NEGATIVE Final    Comment: (NOTE) SARS-CoV-2 target nucleic acids are NOT DETECTED.  The SARS-CoV-2 RNA is generally detectable in upper respiratory specimens during the acute phase of infection. The lowest concentration of SARS-CoV-2 viral copies this assay can detect is 138 copies/mL. A negative result does not preclude SARS-Cov-2 infection and should not be used as the sole basis for treatment or other patient management decisions. A negative result may occur with  improper specimen collection/handling, submission of specimen other than nasopharyngeal swab, presence of viral mutation(s) within the areas targeted by this assay, and inadequate number of viral copies(<138 copies/mL). A negative result must be combined with clinical observations, patient history, and epidemiological information. The expected result is Negative.  Fact Sheet for Patients:  EntrepreneurPulse.com.au  Fact Sheet for Healthcare Providers:  IncredibleEmployment.be  This test is no t yet approved or cleared by the [place] FDA and  has  been authorized for detection and/or diagnosis of SARS-CoV-2 by FDA under an Emergency Use Authorization (EUA). This EUA will remain  in effect (meaning this test can be used) for the duration of the COVID-19 declaration under Section 564(b)(1) of the Act, 21 U.S.C.section 360bbb-3(b)(1), unless the authorization is terminated  or revoked sooner.       Influenza A by PCR NEGATIVE NEGATIVE Final   Influenza B by PCR NEGATIVE NEGATIVE Final    Comment: (NOTE) The Xpert Xpress SARS-CoV-2/FLU/RSV plus assay is intended as an aid in the diagnosis of influenza from Nasopharyngeal swab specimens and should not be used as a sole basis for treatment. Nasal washings and aspirates are unacceptable for Xpert Xpress SARS-CoV-2/FLU/RSV testing.  Fact Sheet for Patients: EntrepreneurPulse.com.au  Fact Sheet for Healthcare Providers: IncredibleEmployment.be  This test is not yet approved or cleared by the [place] FDA and has been authorized for detection and/or diagnosis of SARS-CoV-2 by FDA under an Emergency Use Authorization (EUA). This EUA will remain in effect (meaning this test can be used) for the duration of the COVID-19 declaration under Section 564(b)(1) of the Act, 21 U.S.C. section 360bbb-3(b)(1), unless the authorization is terminated or revoked.  Performed at [facility], [place] [address] [place], [place] [address]     Radiology Reports CT HEAD WO CONTRAST  Result Date: 11/17/2020 CLINICAL DATA:  Altered mental status following a syncopal episode. Seizure-like activity. Possible stroke. History of right-sided parotid carcinoma. EXAM: CT HEAD WITHOUT CONTRAST TECHNIQUE: Contiguous axial images were obtained from the base of the skull through the vertex without intravenous contrast. COMPARISON:  Head MRI 06/25/2019 FINDINGS: Brain: There is a new moderate-sized region of vasogenic edema involving right temporal lobe white matter.  There are a few small  foci of calcification along the inferior aspect of the right temporal lobe which may be associated with some abnormal soft tissue in this region, however a discrete mass is not well demonstrated on this unenhanced study. There is mild local mass effect without midline shift. No acute cortically based infarct, intracranial hemorrhage, or extra-axial fluid collection is identified. The ventricles are normal in size. Vascular: Calcified atherosclerosis at the skull base. No hyperdense vessel. Skull: No fracture or suspicious osseous lesion. Sinuses/Orbits: Partially visualized mucous retention cysts in the right maxillary sinus. New moderate-sized right mastoid effusion. Right-sided eyelid weight. Other: Partially visualized sequelae of right parotidectomy. IMPRESSION: 1. New moderate-sized region of vasogenic edema in the right temporal lobe, suspicious for metastatic disease with this history. Brain MRI without and with contrast is recommended for further evaluation. 2. New right mastoid effusion. These results were called by telephone at the time of interpretation on 11/17/2020 at 12:50 pm to Dr. Sherwood Gambler, who verbally acknowledged these results. Electronically Signed   By: Logan Bores M.D.   On: 11/17/2020 12:51   MR Brain W and Wo Contrast  Result Date: 11/17/2020 CLINICAL DATA:  Provided history: Brain mass or lesion. EXAM: MRI HEAD WITHOUT AND WITH CONTRAST TECHNIQUE: Multiplanar, multiecho pulse sequences of the brain and surrounding structures were obtained without and with intravenous contrast. CONTRAST:  52mL GADAVIST GADOBUTROL 1 MMOL/ML IV SOLN COMPARISON:  Prior head CT examinations 11/17/2020 and earlier. Brain MRI 06/25/2019. FINDINGS: Brain: Mild intermittent motion degradation. New from the prior brain MRI of 06/25/2019, there is a 2.4 x 1.4 x 0.8 cm peripherally enhancing focus within the inferior right temporal lobe (for instance as seen on series 11, image 18) (series  9, image 15). Also new from this prior examination, there is a moderate-sized focus of vasogenic edema within the right temporal lobe extending to the right temporal stem. Diffusion-weighted signal abnormality is present within the cortex of the anterior right temporal lobe, within the right hippocampus and within the right temporal stem. Persistent asymmetric enhancement along the mastoid segment of the right facial nerve (for instance as seen on series 11, image 12). New from the prior MRI, there is asymmetric enhancement along the inferomedial aspect of the right middle cranial fossa (series 11, image 15). Mild multifocal T2/FLAIR hyperintensity elsewhere within the cerebral white matter and within the pons, nonspecific but compatible with chronic small vessel ischemic disease. No chronic intracranial blood products. No extra-axial fluid collection. No midline shift. Vascular: Expected proximal arterial flow voids. Skull and upper cervical spine: No focal marrow lesion. Sinuses/Orbits: Visualized orbits show no acute finding. 2.8 cm right maxillary sinus mucous retention cyst. Other: Large right mastoid effusion. Sequela of interval right parotid resection. These results were called by telephone at the time of interpretation on 11/17/2020 at 5:47 pm to provider Dr.Zhang, who verbally acknowledged these results. IMPRESSION: 2.4 x 1.4 x 0.8 cm peripherally enhancing focus within the inferior right temporal lobe, new as compared to the brain MRI of 06/25/2019. Also new from this prior exam, there is moderate vasogenic edema within the right temporal lobe, extending to the right temporal stem. These findings may reflect necrosis and edema related to prior radiation therapy. However, a right temporal lobe metastasis cannot be excluded. Neuro-Oncology consultation is recommended. Diffusion-weighted signal abnormality within the cortex of the anterior right temporal lobe, within the right hippocampus and within the  medial right thalamus. Findings are nonspecific. Primary considerations are encephalitis (including herpes encephalitis), seizure-related changes or acute ischemia. Sequela of  interval right parotid resection. Persistent asymmetric enhancement of the mastoid segment of the right facial nerve. Residual perineural tumor at this site cannot be excluded. New from the prior brain MRI, there is subtle asymmetric enhancement along the inferomedial aspect of the right middle cranial fossa, which could reflect additional perineural tumor spread (along the greater superficial petrosal nerve). Mild chronic small vessel ischemic changes within the cerebral white matter and pons. Large right mastoid effusion. Large right maxillary sinus mucous retention cyst. Electronically Signed   By: Kellie Simmering DO   On: 11/17/2020 17:57   CT CHEST ABDOMEN PELVIS W CONTRAST  Result Date: 11/18/2020 CLINICAL DATA:  Staging for possible brain neoplasm. EXAM: CT CHEST, ABDOMEN, AND PELVIS WITH CONTRAST TECHNIQUE: Multidetector CT imaging of the chest, abdomen and pelvis was performed following the standard protocol during bolus administration of intravenous contrast. CONTRAST:  175mL OMNIPAQUE IOHEXOL 300 MG/ML  SOLN COMPARISON:  March 21, 2020.  August 15, 2004. FINDINGS: CT CHEST FINDINGS Cardiovascular: Atherosclerosis of thoracic aorta is noted without aneurysm or dissection. Normal cardiac size. No pericardial effusion. Mediastinum/Nodes: 3.4 cm right thyroid mass is noted which is enlarged compared to prior exam. Esophagus is unremarkable. No adenopathy is noted. Lungs/Pleura: No pneumothorax or pleural effusion is noted. Mild bilateral posterior basilar subsegmental atelectasis is noted. Musculoskeletal: No chest wall mass or suspicious bone lesions identified. CT ABDOMEN PELVIS FINDINGS Hepatobiliary: No focal liver abnormality is seen. Status post cholecystectomy. No biliary dilatation. Pancreas: Unremarkable. No pancreatic ductal  dilatation or surrounding inflammatory changes. Spleen: Normal in size without focal abnormality. Adrenals/Urinary Tract: Adrenal glands are unremarkable. Kidneys are normal, without renal calculi, focal lesion, or hydronephrosis. Bladder is unremarkable. Stomach/Bowel: Stomach is within normal limits. Appendix appears normal. No evidence of bowel wall thickening, distention, or inflammatory changes. Vascular/Lymphatic: Aortic atherosclerosis. No enlarged abdominal or pelvic lymph nodes. Reproductive: Status post hysterectomy. No adnexal masses. Other: No abdominal wall hernia or abnormality. No abdominopelvic ascites. Musculoskeletal: No acute or significant osseous findings. IMPRESSION: 3.4 cm right thyroid nodule or mass is noted which is enlarged compared to prior exam. Recommend thyroid US. (Ref: J Am Coll Radiol. 2015 Feb;12(2): 143-50). No other significant abnormality seen in the chest, abdomen or pelvis. Aortic Atherosclerosis (ICD10-I70.0). Electronically Signed   By: Marijo Conception M.D.   On: 11/18/2020 14:56   EEG adult  Result Date: 11/17/2020 Lora Havens, MD     11/17/2020  8:59 PM Patient Name: Chelsea Pedretti MRN: [id number] Epilepsy Attending: Lora Havens Referring Physician/Provider: Dr Amie Portland Date: 11/17/2020 Duration: 21.32 mins Patient history: 60 year old with past medical history of a 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 EEG to evaluate for seizure Level of alertness: Awake AEDs during EEG study: LEV Technical aspects: This EEG study was done with scalp electrodes positioned according to the 10-20 International system of electrode placement. Electrical activity was acquired at a sampling rate of 500Hz  and reviewed with a high frequency filter of 70Hz  and a low frequency filter of 1Hz . EEG data were recorded continuously and digitally stored.  Description: No posterior dominant rhythm was seen. EEG showed continuous generalized and lateralized polymorphic sharply contoured 5 to 9 Hz theta-alpha activity. Continuous 2-3hz  delta slowing was also noted in right temporo-parietal region. Lateralized periodic discharges were noted in right hemisphere, maximal ight temporo-parietal region, at 1hz  with superimposed rhythmicity at times.  Hyperventilation and photic stimulation were not performed.  ABNORMALITY - Lateralized periodic discharges with overriding rhythmic activity ( LPD +) right, maximal temporo-parietal region - Continuous slow, generalized and maximal right temporo-parietal region. IMPRESSION: This study howed evidence of epileptogenicity as well as cortical dysfunction arising from right temporo-parietal region likely secondary to underlying structural abnormality/ mass. The lateralized periodic discharges with overriding rhythmic activity are on the ictal-interictal continuum with high potential for seizures, There is also moderate diffuse encephalopathy, nonspecific etiology. No seizures were seen throughout the recording. Dr Rory Percy was notified. Priyanka [name]   Overnight EEG with video  Result Date: 11/18/2020 Lora Havens, MD     11/19/2020  8:34 AM Patient Name: Shenaya Lebo MRN: [id number] Epilepsy Attending: Lora Havens Referring Physician/Provider: Dr Amie Portland Duration: 11/17/2020 1825 to 11/18/2020 1142, 1949  to 11/19/2020 0300  Patient history: 60 year old with past medical history of a 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 EEG to evaluate for seizure  Level of alertness: Awake, asleep  AEDs during EEG study: LEV, PHT  Technical aspects: This EEG study was done with scalp electrodes positioned according to the 10-20 International system of electrode placement. Electrical activity was acquired at a  sampling rate of 500Hz  and reviewed with a high frequency filter of 70Hz  and a low frequency filter of 1Hz . EEG data were recorded continuously and digitally stored.  Description: No posterior dominant rhythm was seen. EEG showed continuous generalized and lateralized polymorphic sharply contoured 5 to 9 Hz theta-alpha activity. Continuous 2-3hz  delta slowing was also noted in right temporo-parietal region. Lateralized periodic discharges were noted in right hemisphere, maximal ight temporo-parietal region, at 0.5-1hz  with superimposed rhythmicity at times.  Seizures without clinical signs were also noted arising from right temporoparietal region, average 2-3 seizures per hour, lasting about 15 to 45 seconds each.  EEG was interrupted between 11/18/2020 1142 to 1949 for procedures.  After 1949, EEG continued to show lateralized periodic discharges in right hemisphere, maximal right temporoparietal region.  Brief ictal-interictal rhythmic discharges were also noted in right temporoparietal region.  ABNORMALITY -Seizure without clinical signs, right temporoparietal region -Brief ictal-interictal rhythmic discharges, right temporoparietal region (BIRDs) - Lateralized periodic discharges with overriding rhythmic activity ( LPD +) right, maximal temporo-parietal region - Continuous slow, generalized and maximal right temporo-parietal region.  IMPRESSION: This study showed seizures without clinical signs arising from right temporoparietal region, average 2- 3/hour, lasting about 15 to 45 seconds each.  After 1949 on 11/18/2020, EEG showed brief ictal-interictal rhythmic discharges arising from right temporoparietal region in addition to lateralized periodic discharges with overriding rhythmic activity which are on the ictal-interictal continuum with high potential for seizures.  There is also evidence of cortical dysfunction arising from right temporo-parietal region likely secondary to underlying structural abnormality/  mass.  Additionally, there is moderate diffuse encephalopathy, nonspecific etiology.  Priyanka [name]   CT STEALTH HEAD W/O CONTRAST  Result Date: 11/18/2020 CLINICAL DATA:  Brain mass. Surgical planning. Right parotid malignancy EXAM: CT HEAD WITHOUT CONTRAST TECHNIQUE: Contiguous axial images were obtained from the base of the skull through the vertex without intravenous contrast. COMPARISON:  CT and MRI head 11/17/2020 FINDINGS: Brain: Mass lesion in the right temporal lobe contains small calcifications and white matter edema throughout the right temporal lobe. No change from yesterday. No associated hemorrhage. Ventricle size normal. No midline shift. No acute infarct. No other mass lesion identified. There is streak artifact from metallic implant in the  right eyelid. Vascular: Negative for hyperdense vessel Skull: Negative Sinuses/Orbits: Retention cyst right maxillary sinus. Remaining paranasal sinuses clear. Right mastoid effusion. Left mastoid clear. Metallic implant in the right eyelid. Other: Right parotidectomy. IMPRESSION: Mass lesion right temporal lobe unchanged. Mass contains small areas of calcification diffuse white matter edema. No change from yesterday. Electronically Signed   By: Franchot Gallo M.D.   On: 11/18/2020 12:59

## 2020-11-19 NOTE — Progress Notes (Signed)
Neurology Progress Note   S:// Seen and examined.  Appears more awake than yesterday.  Follows all commands. Overnight EEG with brief ictal interictal rhythmic discharges in the right temporoparietal region.  Lateral periodic discharges with overlying rhythmic activity LPD plus in the right.  Continuously slow which is generalized and maximal in right temporal parietal region.  O:// Current vital signs: BP 118/77   Pulse 73   Temp (!) 97.1 F (36.2 C) (Temporal)   Resp 17   Ht _0  (1.651 m)   Wt 68 kg   SpO2 100%   BMI 24.95 kg/m  Vital signs in last 24 hours: Temp:  [97.1 F (36.2 C)-99.6 F (37.6 C)] 97.1 F (36.2 C) (07/01 1052) Pulse Rate:  [73-96] 73 (07/01 1052) Resp:  [16-20] 17 (07/01 1052) BP: (98-150)/(53-77) 118/77 (07/01 1052) SpO2:  [90 %-100 %] 100 % (07/01 1052) General: Awake alert in no distress HEENT: Normocephalic, right facial weakness-LMN type post sellar gland surgery CVs: Regular rhythm Respiratory: Clear Extremities warm well perfused Neurological exam Awake alert oriented x3 Speech is mildly dysarthric No evidence of aphasia Mildly reduced attention concentration Cranial nerves: Pupils equal round react light, extraocular movements intact, visual fields full, right face LMN palsy-baseline.  Auditory acuity intact. Motor exam: Antigravity in all fours Sensation intact to light touch without extinction on double some to stimulation  Medications  Current Facility-Administered Medications:    acetaminophen (TYLENOL) tablet 1,000 mg, 1,000 mg, Oral, Q6H PRN, Wynetta Fines T, MD   acyclovir (ZOVIRAX) 680 mg in dextrose 5 % 100 mL IVPB, 10 mg/kg, Intravenous, Q8H, Wynetta Fines T, MD, Last Rate: 114 mL/hr at 11/19/20 0400, Infusion Verify at 11/19/20 0400   dexamethasone (DECADRON) injection 4 mg, 4 mg, Intravenous, Q6H, Amie Portland, MD, 4 mg at 11/19/20 0521   docusate sodium (COLACE) capsule 100 mg, 100 mg, Oral, Daily PRN, Wynetta Fines T, MD    lactated ringers 1,000 mL with potassium chloride 30 mEq infusion, , Intravenous, Continuous, Thurnell Lose, MD, Last Rate: 75 mL/hr at 11/19/20 0939, New Bag at 11/19/20 0939   levETIRAcetam (KEPPRA) IVPB 1000 mg/100 mL premix, 1,000 mg, Intravenous, Q12H, Amie Portland, MD, Stopped at 11/19/20 0100   LORazepam (ATIVAN) injection 4 mg, 4 mg, Intravenous, Q5 min PRN, Wynetta Fines T, MD   [DISCONTINUED] ondansetron [facility]) tablet 4 mg, 4 mg, Oral, Q6H PRN **OR** ondansetron (ZOFRAN) injection 4 mg, 4 mg, Intravenous, Q6H PRN, Wynetta Fines T, MD, 4 mg at 11/18/20 1031   pantoprazole (PROTONIX) EC tablet 40 mg, 40 mg, Oral, Daily, Wynetta Fines T, MD, 40 mg at 11/19/20 0935   PARoxetine (PAXIL) tablet 40 mg, 40 mg, Oral, Daily, Wynetta Fines T, MD, 40 mg at 11/19/20 0935   phenytoin (DILANTIN) injection 100 mg, 100 mg, Intravenous, Q8H, Amie Portland, MD, 100 mg at 11/19/20 0521   polyvinyl alcohol (LIQUIFILM TEARS) 1.4 % ophthalmic solution 1 drop, 1 drop, Right Eye, Daily PRN, Wynetta Fines T, MD   traZODone (DESYREL) tablet 50 mg, 50 mg, Oral, QHS PRN, Lequita Halt, MD Labs CBC    Component Value Date/Time   WBC 10.1 11/19/2020 0048   RBC 4.12 11/19/2020 0048   HGB 12.3 11/19/2020 0048   HGB 14.3 07/23/2017 0820   HCT 38.0 11/19/2020 0048   HCT 42.7 07/23/2017 0820   PLT 255 11/19/2020 0048   PLT 240 07/23/2017 0820   MCV 92.2 11/19/2020 0048   MCV 88 07/23/2017 0820   MCH 29.9 11/19/2020 0048  MCHC 32.4 11/19/2020 0048   RDW 13.2 11/19/2020 0048   RDW 13.6 07/23/2017 0820   LYMPHSABS 1.0 11/19/2020 0048   LYMPHSABS 2.5 07/23/2017 0820   MONOABS 0.6 11/19/2020 0048   EOSABS 0.0 11/19/2020 0048   EOSABS 0.2 07/23/2017 0820   BASOSABS 0.0 11/19/2020 0048   BASOSABS 0.1 07/23/2017 0820    CMP     Component Value Date/Time   NA 138 11/19/2020 0048   NA 140 07/23/2017 0820   K 3.5 11/19/2020 0048   CL 102 11/19/2020 0048   CO2 30 11/19/2020 0048   GLUCOSE 101 (H) 11/19/2020 0048    BUN 6 11/19/2020 0048   BUN 11 07/23/2017 0820   CREATININE 0.63 11/19/2020 0048   CALCIUM 8.8 (L) 11/19/2020 0048   PROT 6.3 (L) 11/19/2020 0048   PROT 7.0 07/23/2017 0820   ALBUMIN 3.5 11/19/2020 0048   ALBUMIN 4.4 07/23/2017 0820   AST 19 11/19/2020 0048   ALT 16 11/19/2020 0048   ALKPHOS 61 11/19/2020 0048   BILITOT 0.6 11/19/2020 0048   BILITOT 0.2 07/23/2017 0820   GFRNONAA >60 11/19/2020 0048   GFRAA >60 08/26/2019 0830    Imaging I have reviewed images in epic and the results pertinent to this consultation are: MRI brain with and without contrast with a 2.4 x 1.4 x 0.8 cm peripherally enhancing focus within the inferior right temporal lobe new when compared to the MRI of February 2021.  Also new from this prior exam has moderate vasogenic edema within the right temporal lobe extending to the right temporal stem.  These findings may reflect necrosis and edema related to prior radiation however a new MAC cannot be excluded. Diffusion weighted signal abnormality within the cortex of the anterior right temporal lobe within the right hippocampus and within the right medial thalamus-nonspecific but primary considerations include HSV encephalitis and seizure related changes or acute ischemia. New from prior MRI of the brain there is subtle asymmetric enhancement along the inferior medial aspect of the right middle cranial fossa which could reflect additional perineural tumor spread along the greater superficial petrosal nerve.  Persistent asymmetric enhancement of the mastoid segment of the right facial nerve, residual perineural tumor at the site cannot be excluded.  Sequela of interval right parotid resection.  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  CT head revealing of a new right temporal lobe vasogenic edema with concerns  for metastatic process.  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  My suspicion is that the hippocampal findings are likely related to seizures or a part of continuing mass/necrosis from radiation.  But it is prudent to rule out HSV encephalitis.  EEG with resolution of seizures but still has LPD's and BIRDS-with improving exam, I would not escalate antiepileptics further but would continue LTM EEG for another day.  Work-up for evaluation of the right temporal lesion underway MR spectroscopy done-read pending. Bedside LP unsuccessful, going for fluoroscopy guided tap today.  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 Continue dexamethasone Continue LTM EEG Continue empiric acyclovir till HSV PCR is negative.  Marland Kitchen  LP under fluoroscopy-appreciate radiology assistance Dr. Mickeal Skinner had come in to see the patient yesterday but unfortunately she was out for testing-appreciate continuing input from Dr. Mickeal Skinner from neurooncology. Dr. Reatha Armour from neurosurgery also following-biopsy planned but not until beginning of next week.  Continue to maintain seizure precautions  Plan discussed with Dr. Candiss Norse, and patient's husband at bedside.  -- Amie Portland, MD Neurologist [facility] Pager: [phone]

## 2020-11-19 NOTE — Procedures (Addendum)
Patient Name: Melissa Case  MRN: [id number]  Epilepsy Attending: Lora Havens  Referring Physician/Provider: Dr Amie Portland Duration: 11/19/2020 0300 to 11/20/2020 0300   Patient history: 60 year old with past medical history of a 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 EEG to evaluate for seizure   Level of alertness: Awake, asleep   AEDs during EEG study: LEV, PHT   Technical aspects: This EEG study was done with scalp electrodes positioned according to the 10-20 International system of electrode placement. Electrical activity was acquired at a sampling rate of 500Hz  and reviewed with a high frequency filter of 70Hz  and a low frequency filter of 1Hz . EEG data were recorded continuously and digitally stored.   Description: No posterior dominant rhythm was seen. EEG showed continuous generalized and lateralized polymorphic sharply contoured 5 to 9 Hz theta-alpha activity. Continuous 2-3hz  delta slowing was also noted in right temporo-parietal region. Lateralized periodic discharges were noted in right hemisphere, maximal ight temporo-parietal region, at 0.5-1hz  with superimposed rhythmicity at times.   Brief ictal-interictal rhythmic discharges were also noted in right temporoparietal region. One seizures without clinical signs was also noted arising from right temporoparietal region on 11/19/2020 at 1350, lasting about 25 seconds.    ABNORMALITY - Seizure without clinical signs, right temporoparietal region -Brief ictal-interictal rhythmic discharges, right temporoparietal region (BIRDs) - Lateralized periodic discharges with overriding rhythmic activity ( LPD +) right, maximal temporo-parietal region - Continuous slow, generalized and maximal right temporo-parietal region.   IMPRESSION: This study showed one seizures without clinical signs arising from right  temporoparietal region on 11/19/2020 at 1350, lasting about 25 seconds. Brief ictal-interictal rhythmic discharges arising from right temporoparietal region in addition to lateralized periodic discharges with overriding rhythmic activity which are on the ictal-interictal continuum with high potential for seizures were also noted.  There is also evidence of cortical dysfunction arising from right temporo-parietal region likely secondary to underlying structural abnormality/ mass.  Additionally, there is moderate diffuse encephalopathy, nonspecific etiology.     Soliana Kitko [name]

## 2020-11-19 NOTE — Procedures (Signed)
CLINICAL DATA: [60 year old female with mental status changes, for lumbar puncture..] EXAM:  DIAGNOSTIC LUMBAR PUNCTURE UNDER FLUOROSCOPIC GUIDANCE COMPARISON: [Prior exams] FLUOROSCOPY TIME: Fluoroscopy Time:  [48 seconds] Number of Acquired Spot Images: [2 PROCEDURE:  Informed consent was obtained from the patient prior to the procedure, including potential complications of headache, allergy, and pain.  With the patient prone, the lower back was prepped with Betadine.  1% Lidocaine was used for local anesthesia.  Lumbar puncture was performed at the [L4-5] level using a [20] gauge needle with return of [clear colorless] CSF with an opening pressure of [13] cm water.  [Sixteen] ml of CSF were obtained for laboratory studies.  The patient tolerated the procedure well and there were no apparent complications.   IMPRESSION: [Fluoroscopic guided lumbar puncture.  No immediate complication.]

## 2020-11-19 NOTE — Evaluation (Signed)
Occupational Therapy Evaluation Patient Details Name: Melissa Case MRN: [id number] DOB: 1960-07-22 Today's Date: 11/19/2020    History of Present Illness Pt is a 60 year old female admitted 6/29 that presented after a seizure.  She was on her way to work when she had a seizure episode that her husband witnessed.  CT revealed right temporal mass suspicious for metastatic lesion.  EEG in place on evaluation.  Neuro following pt. PMH:  parotid cancer, status post resection and radiation in 2021   Clinical Impression   Patient is currently requiring assistance with ADLs including minimal assist with toileting, LE dressing, and with bathing (based on general assessment as pt still on EEG and unable to go to bathroom), and setup assist/supervision for seated UE dressing and grooming, all of which is below patient's typical baseline of being Independent.  During this evaluation, patient was limited by somnolence, confusion, impulsivity but improved since PT evaluation on 6/30, and decreased gross motor coordination particularly to LEs during lateral steps with RW, all of which has the potential to impact patient's safety and independence during functional mobility, as well as performance for ADLs. [place] "6-clicks" Daily Activity Inpatient Short Form score of 19/24 this session. Patient lives with her spouse, who is able to provide 24/7 supervision and assistance.  Patient demonstrates fair rehab potential, and should benefit from continued skilled occupational therapy services while in acute care to maximize safety, independence and quality of life at home.  Continued occupational therapy services in the home is recommended.  Will update recommendation if pt shows need of a higher level of care at discharge.   ?    Follow Up Recommendations  Home health OT (Ongoing assessment as pt still in testing.)    Equipment Recommendations  3 in 1 bedside commode    Recommendations for Other  Services       Precautions / Restrictions Precautions Precautions: Fall Precaution Comments: EEG in place Restrictions Weight Bearing Restrictions: No      Mobility Bed Mobility Overal bed mobility: Needs Assistance Bed Mobility: Supine to Sit;Sit to Supine     Supine to sit: Supervision;HOB elevated Sit to supine: Supervision;HOB elevated        Transfers Overall transfer level: Needs assistance Equipment used: Rolling walker (2 wheeled) Transfers: Sit to/from Stand Sit to Stand: Min guard (Cues for hand placement with increased effort for pt to t stand.)              Balance Overall balance assessment: Needs assistance Sitting-balance support: Feet supported;Single extremity supported Sitting balance-Leahy Scale: Fair Sitting balance - Comments: Fair due to somnolence and cognitive deficits   Standing balance support: Bilateral upper extremity supported;During functional activity Standing balance-Leahy Scale: Poor Standing balance comment: Required BUE support on RW and Min guard assist for safety in standing.             High level balance activites: Side stepping High Level Balance Comments: Min As needed with pt using RW to take lateral step to RT with decreased gross motor coordination noted to feet and pt impulsively sitting down on EOB without warning. Min As needed for safety descent.           ADL either performed or assessed with clinical judgement   ADL Overall ADL's : Needs assistance/impaired Eating/Feeding: Supervision/ safety;Bed level   Grooming: Sitting;Set up;Wash/dry hands   Upper Body Bathing: Sitting;Supervision/ safety;Set up   Lower Body Bathing: Minimal assistance;Sitting/lateral leans;Sit to/from stand Lower Body Bathing Details (indicate cue  type and reason): Based on general assessment. Pt on EEG and not yet able to reach to bathroom. Upper Body Dressing : Minimal assistance Upper Body Dressing Details (indicate cue type  and reason): Min As due to decreased motor planning. Lower Body Dressing: Minimal assistance;Sitting/lateral leans;Sit to/from stand Lower Body Dressing Details (indicate cue type and reason): EOB, pt able to perform modified figure 4 and don socks with setup and close supervision. In standing pt given Min As for balance safety while adjusting gowns.   Toilet Transfer Details (indicate cue type and reason): Unable due to EEG. See Mobilty for transfers.         Functional mobility during ADLs: Supervision/safety;Min guard;Minimal assistance;Rolling walker       Vision Baseline Vision/History: Wears glasses Wears Glasses: At all times Additional Comments: Difficult to assess this visit due to somnolence. Pt often closing eyes.     Perception     Praxis      Pertinent Vitals/Pain Pain Assessment: No/denies pain     Hand Dominance Right   Extremity/Trunk Assessment Upper Extremity Assessment Upper Extremity Assessment: Overall WFL for tasks assessed   Lower Extremity Assessment Lower Extremity Assessment: Defer to PT evaluation   Cervical / Trunk Assessment Cervical / Trunk Assessment:  (Stooped in standing)   Communication Communication Communication:  (Speech a little slurred at times but intellegable. RT facial droop baseline for Bell's Palsy)   Cognition Arousal/Alertness: Lethargic Behavior During Therapy: Impulsive;WFL for tasks assessed/performed (Mildly impulsive but redirectable.) Overall Cognitive Status: Impaired/Different from baseline Area of Impairment: Safety/judgement;Problem solving;Attention                   Current Attention Level: Sustained     Safety/Judgement: Decreased awareness of deficits;Decreased awareness of safety   Problem Solving: Difficulty sequencing;Requires verbal cues General Comments: Improved ability to follow 1-2 step commands this visit with pt demonstrating mild impulsivity but redirectable. Pt's sister and husband  present and endorse that pt is not yet at baseline mentation which is without deficit.   General Comments       Exercises     Shoulder Instructions      Home Living Family/patient expects to be discharged to:: Private residence Living Arrangements: Spouse/significant other Available Help at Discharge: Family;Available 24 hours/day Type of Home: House Home Access: Stairs to enter [employer] of Steps: 4 Entrance Stairs-Rails: Can reach both;Left;Right Home Layout: One level     Bathroom Shower/Tub: Occupational psychologist: Standard     Home Equipment: Cane - single point   Additional Comments: Pt's husband uses the cane, so not available to pt.  Pt has an adjustable bed.      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        OT Problem List: Decreased coordination;Decreased cognition;Decreased activity tolerance;Decreased safety awareness;Decreased knowledge of use of DME or AE;Impaired balance (sitting and/or standing);Decreased knowledge of precautions      OT Treatment/Interventions: Self-care/ADL training;Therapeutic exercise;Therapeutic activities;Cognitive remediation/compensation;Visual/perceptual remediation/compensation;Energy conservation;DME and/or AE instruction;Patient/family education;Balance training    OT Goals(Current goals can be found in the care plan section) Acute Rehab OT Goals Patient Stated Goal: to go home OT Goal Formulation: With patient/family Time For Goal Achievement: 12/03/20 Potential to Achieve Goals: Fair ADL Goals Pt Will Perform Grooming: standing;with modified independence Pt Will Perform Lower Body Bathing: with supervision;sitting/lateral leans;sit to/from stand Pt Will Perform  Lower Body Dressing: with modified independence;sitting/lateral leans;sit to/from stand Pt Will Transfer to Toilet: with  modified independence;ambulating;regular height toilet Pt Will Perform Toileting - Clothing Manipulation and hygiene: with modified independence;sitting/lateral leans;sit to/from stand  OT Frequency: Min 2X/week   Barriers to D/C:            Co-evaluation              AM-PAC OT "6 Clicks" Daily Activity     Outcome Measure Help from another person eating meals?: None Help from another person taking care of personal grooming?: A Little Help from another person toileting, which includes using toliet, bedpan, or urinal?: A Little Help from another person bathing (including washing, rinsing, drying)?: A Little Help from another person to put on and taking off regular upper body clothing?: A Little Help from another person to put on and taking off regular lower body clothing?: A Little 6 Click Score: 19   End of Session Equipment Utilized During Treatment: Rolling walker Nurse Communication: Mobility status  Activity Tolerance: Patient tolerated treatment well Patient left: in bed;with call bell/phone within reach;with bed alarm set;with family/visitor present  OT Visit Diagnosis: Unsteadiness on feet (R26.81) (Z73.6-decreased ADLs)                Time: 3614-4315 OT Time Calculation (min): 28 min Charges:  OT General Charges $OT Visit: 1 Visit OT Evaluation $OT Eval Low Complexity: 1 Low OT Treatments $Therapeutic Activity: 8-22 mins  Anderson Malta, [place] Office: [phone] 11/19/2020 Julien Girt 11/19/2020, 10:42 AM

## 2020-11-19 NOTE — Progress Notes (Signed)
SLP Cancellation Note  Patient Details Name: Melissa Case MRN: [id number] DOB: 03-25-1961   Cancelled treatment:       Reason Eval/Treat Not Completed: Other (comment) (pt going to xray) for procedure.  Will continue efforts   Kathleen Lime, MS [facility] SLP Acute Rehab Services Office [phone] Pager [phone]  Macario Golds 11/19/2020, 10:34 AM

## 2020-11-19 NOTE — Progress Notes (Signed)
LTM maint changing out machine.

## 2020-11-19 NOTE — Progress Notes (Signed)
EEG maintenance performed.  No skin breakdown observed at electrode sites F8, Fp1.

## 2020-11-20 LAB — CBC WITH DIFFERENTIAL/PLATELET
Abs Immature Granulocytes: 0.02 10*3/uL (ref 0.00–0.07)
Basophils Absolute: 0 10*3/uL (ref 0.0–0.1)
Basophils Relative: 0 %
Eosinophils Absolute: 0 10*3/uL (ref 0.0–0.5)
Eosinophils Relative: 0 %
HCT: 37.8 % (ref 36.0–46.0)
Hemoglobin: 12.7 g/dL (ref 12.0–15.0)
Immature Granulocytes: 0 %
Lymphocytes Relative: 25 %
Lymphs Abs: 1.7 10*3/uL (ref 0.7–4.0)
MCH: 30.1 pg (ref 26.0–34.0)
MCHC: 33.6 g/dL (ref 30.0–36.0)
MCV: 89.6 fL (ref 80.0–100.0)
Monocytes Absolute: 0.6 10*3/uL (ref 0.1–1.0)
Monocytes Relative: 10 %
Neutro Abs: 4.4 10*3/uL (ref 1.7–7.7)
Neutrophils Relative %: 65 %
Platelets: 234 10*3/uL (ref 150–400)
RBC: 4.22 MIL/uL (ref 3.87–5.11)
RDW: 13 % (ref 11.5–15.5)
WBC: 6.7 10*3/uL (ref 4.0–10.5)
nRBC: 0 % (ref 0.0–0.2)

## 2020-11-20 LAB — COMPREHENSIVE METABOLIC PANEL
ALT: 18 U/L (ref 0–44)
AST: 17 U/L (ref 15–41)
Albumin: 3.4 g/dL — ABNORMAL LOW (ref 3.5–5.0)
Alkaline Phosphatase: 61 U/L (ref 38–126)
Anion gap: 6 (ref 5–15)
BUN: 8 mg/dL (ref 6–20)
CO2: 29 mmol/L (ref 22–32)
Calcium: 8.8 mg/dL — ABNORMAL LOW (ref 8.9–10.3)
Chloride: 102 mmol/L (ref 98–111)
Creatinine, Ser: 0.63 mg/dL (ref 0.44–1.00)
GFR, Estimated: >60 mL/min (ref >60)
Glucose, Bld: 98 mg/dL (ref 70–99)
Potassium: 3.7 mmol/L (ref 3.5–5.1)
Sodium: 137 mmol/L (ref 135–145)
Total Bilirubin: 0.5 mg/dL (ref 0.3–1.2)
Total Protein: 6.3 g/dL — ABNORMAL LOW (ref 6.5–8.1)

## 2020-11-20 LAB — BRAIN NATRIURETIC PEPTIDE: B Natriuretic Peptide: 31.5 pg/mL (ref 0.0–100.0)

## 2020-11-20 LAB — MAGNESIUM: Magnesium: 2 mg/dL (ref 1.7–2.4)

## 2020-11-20 MED ORDER — NICOTINE 21 MG/24HR TD PT24
21.0000 mg | MEDICATED_PATCH | Freq: Every day | TRANSDERMAL | Status: DC
  Administered 2020-11-20 – 2020-11-25 (×6): 21 mg via TRANSDERMAL
  Filled 2020-11-20 (×6): qty 1

## 2020-11-20 NOTE — Progress Notes (Signed)
Neurology Progress Note   S:// Seen and examined.  Appears more awake than yesterday.  Follows all commands. Overnight EEG with brief ictal interictal rhythmic discharges in the right temporoparietal region.  Lateral periodic discharges with overlying rhythmic activity LPD plus in the right.  Continuously slow which is generalized and maximal in right temporal parietal region.  O:// Current vital signs: BP 114/64 (BP Location: Left Arm)   Pulse 88   Temp 99.4 F (37.4 C) (Oral)   Resp (!) 21   Ht _0  (1.651 m)   Wt 68 kg   SpO2 96%   BMI 24.95 kg/m  Vital signs in last 24 hours: Temp:  [98.7 F (37.1 C)-99.6 F (37.6 C)] 99.4 F (37.4 C) (07/02 1204) Pulse Rate:  [78-91] 88 (07/02 1204) Resp:  [12-22] 21 (07/02 1204) BP: (107-126)/(57-73) 114/64 (07/02 1204) SpO2:  [94 %-97 %] 96 % (07/02 1204) General: Awake alert in no distress HEENT: Normocephalic, right facial weakness-baseline LMN type post salivary gland surgery and sentinel nerve injury. CVs: Regular rhythm Respiratory: Clear Extremities warm well perfused Neurological exam Awake alert oriented x3 Speech is mildly dysarthric No evidence of aphasia Nearly normal attention concntration today -improved from yesterday. Cranial nerves: Pupils equal round react light, extraocular movements intact, visual fields full, right face LMN palsy-baseline.  Auditory acuity intact. Motor exam: Antigravity in all fours Sensation intact to light touch without extinction on double some to stimulation  Medications  Current Facility-Administered Medications:    acetaminophen (TYLENOL) tablet 1,000 mg, 1,000 mg, Oral, Q6H PRN, Wynetta Fines T, MD   acyclovir (ZOVIRAX) 680 mg in dextrose 5 % 100 mL IVPB, 10 mg/kg, Intravenous, Q8H, Wynetta Fines T, MD, Last Rate: 113.6 mL/hr at 11/20/20 1244, 680 mg at 11/20/20 1244   dexamethasone (DECADRON) injection 4 mg, 4 mg, Intravenous, Q6H, Amie Portland, MD, 4 mg at 11/20/20 1242   docusate  sodium (COLACE) capsule 100 mg, 100 mg, Oral, Daily PRN, Wynetta Fines T, MD   levETIRAcetam (KEPPRA) IVPB 1000 mg/100 mL premix, 1,000 mg, Intravenous, Q12H, Amie Portland, MD, Last Rate: 400 mL/hr at 11/20/20 0948, 1,000 mg at 11/20/20 0948   LORazepam (ATIVAN) injection 4 mg, 4 mg, Intravenous, Q5 min PRN, Wynetta Fines T, MD   [DISCONTINUED] ondansetron [facility]) tablet 4 mg, 4 mg, Oral, Q6H PRN **OR** ondansetron (ZOFRAN) injection 4 mg, 4 mg, Intravenous, Q6H PRN, Wynetta Fines T, MD, 4 mg at 11/19/20 1541   pantoprazole (PROTONIX) EC tablet 40 mg, 40 mg, Oral, Daily, Wynetta Fines T, MD, 40 mg at 11/20/20 0944   PARoxetine (PAXIL) tablet 40 mg, 40 mg, Oral, Daily, Wynetta Fines T, MD, 40 mg at 11/20/20 0944   phenytoin (DILANTIN) injection 100 mg, 100 mg, Intravenous, Q8H, Amie Portland, MD, 100 mg at 11/20/20 2836   polyvinyl alcohol (LIQUIFILM TEARS) 1.4 % ophthalmic solution 1 drop, 1 drop, Right Eye, Daily PRN, Wynetta Fines T, MD   traZODone (DESYREL) tablet 50 mg, 50 mg, Oral, QHS PRN, Lequita Halt, MD Labs CBC    Component Value Date/Time   WBC 6.7 11/20/2020 0132   RBC 4.22 11/20/2020 0132   HGB 12.7 11/20/2020 0132   HGB 14.3 07/23/2017 0820   HCT 37.8 11/20/2020 0132   HCT 42.7 07/23/2017 0820   PLT 234 11/20/2020 0132   PLT 240 07/23/2017 0820   MCV 89.6 11/20/2020 0132   MCV 88 07/23/2017 0820   MCH 30.1 11/20/2020 0132   MCHC 33.6 11/20/2020 0132   RDW 13.0  11/20/2020 0132   RDW 13.6 07/23/2017 0820   LYMPHSABS 1.7 11/20/2020 0132   LYMPHSABS 2.5 07/23/2017 0820   MONOABS 0.6 11/20/2020 0132   EOSABS 0.0 11/20/2020 0132   EOSABS 0.2 07/23/2017 0820   BASOSABS 0.0 11/20/2020 0132   BASOSABS 0.1 07/23/2017 0820    CMP     Component Value Date/Time   NA 137 11/20/2020 0132   NA 140 07/23/2017 0820   K 3.7 11/20/2020 0132   CL 102 11/20/2020 0132   CO2 29 11/20/2020 0132   GLUCOSE 98 11/20/2020 0132   BUN 8 11/20/2020 0132   BUN 11 07/23/2017 0820   CREATININE 0.63  11/20/2020 0132   CALCIUM 8.8 (L) 11/20/2020 0132   PROT 6.3 (L) 11/20/2020 0132   PROT 7.0 07/23/2017 0820   ALBUMIN 3.4 (L) 11/20/2020 0132   ALBUMIN 4.4 07/23/2017 0820   AST 17 11/20/2020 0132   ALT 18 11/20/2020 0132   ALKPHOS 61 11/20/2020 0132   BILITOT 0.5 11/20/2020 0132   BILITOT 0.2 07/23/2017 0820   GFRNONAA >60 11/20/2020 0132   GFRAA >60 08/26/2019 0830    Imaging I have reviewed images in epic and the results pertinent to this consultation are: MRI brain with and without contrast with a 2.4 x 1.4 x 0.8 cm peripherally enhancing focus within the inferior right temporal lobe new when compared to the MRI of February 2021.  Also new from this prior exam has moderate vasogenic edema within the right temporal lobe extending to the right temporal stem.  These findings may reflect necrosis and edema related to prior radiation however a new MAC cannot be excluded. Diffusion weighted signal abnormality within the cortex of the anterior right temporal lobe within the right hippocampus and within the right medial thalamus-nonspecific but primary considerations include HSV encephalitis and seizure related changes or acute ischemia. New from prior MRI of the brain there is subtle asymmetric enhancement along the inferior medial aspect of the right middle cranial fossa which could reflect additional perineural tumor spread along the greater superficial petrosal nerve.  Persistent asymmetric enhancement of the mastoid segment of the right facial nerve, residual perineural tumor at the site cannot be excluded.  Sequela of interval right parotid resection.  MR spectroscopy with abnormal inferior right temporal lobe area of dark T2 signal corresponding to enhancement on recent anatomic MRI-increase lipid/lactate can be a marker for solid/necrosis but can also be seen in brain mets.  Also question elevation of creatinine/choline peak which can be seen with tumor hypercellularity.  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  CT head revealing of a new right temporal lobe vasogenic edema with concerns for metastatic process.  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  My suspicion is that the hippocampal findings are likely related to seizures or a part of continuing mass/necrosis from radiation.  But it is prudent to rule out HSV encephalitis.  EEG with resolution of seizures but still has LPD's and BIRDS-with improving exam, I would not escalate antiepileptics.  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 Underwent fluoroscopic guided spinal tap yesterday.  Impression: Right temporal lobe vasogenic edema-evaluate for metastasis versus primary versus radiation  necrosis Work-up for HSV encephalitis underway-low suspicion but continue antiviral still HSV PCR is negative. Status epilepticus-on arrival-resolved with antiepileptics. 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  Recommendations: -Continue Keppra 1000 twice daily and phenytoin 100 3 times daily. -Continue dexamethasone -Discontinue LTM -Continue empiric acyclovir till HSV PCR is negative.  -Defer biopsy decision to neurosurgery. Continue to maintain seizure precautions  Plan discussed with Dr. Sloan Leiter, and patient's husband at bedside.  -- Amie Portland, MD Neurologist [facility] Pager: [phone]

## 2020-11-20 NOTE — Progress Notes (Signed)
PROGRESS NOTE                                                                                                                                                                                                             Patient Demographics:    Melissa Case, is a 60 y.o. female, DOB - 04/30/1961, MDY:[id number]  Outpatient Primary MD for the patient is Patient, No Pcp Per (Inactive)    LOS - 3  Admit date - 11/17/2020    Chief Complaint  Patient presents with   Altered Mental Status       Brief Narrative 60 year old with past history of  high-grade carcinoma of right parotid gland-s/p right parotidectomy on 07/22/2019-and postoperative radiation-presenting with seizure-like activity.    Significant studies: 6/29>> CT head: Moderate size vasogenic edema in the right temporal lobe 6/29>> MRI brain: 2.4 x 1.4 x 0.8 peripherally enhancing focus in the right temporal lobe with vasogenic edema. 6/29>> spot EEG :epileptogenicity arising from right temporo-parietal region 6/29-6/30>> LTM EEG: Seizure arising from right temporoparietal region 7/1>> CSF oligoclonal bands: Pending  Procedures: 7/1>> LP  Culture data: CSF culture: No growth CSF HSV PCR: Pending CSF VDRL: Pending CSF fungal culture: Pending  Cytology: CSF cytology: Pending    Subjective:   Lying comfortably in bed-denies any chest pain-shortness of breath.  Spouse and sister at bedside.   Assessment  & Plan :    Seizures: Brain imaging showing right temporal lobe lesion- Neurology following-recommendations are to continue Keppra and phenytoin.  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  Tentatively scheduled for brain biopsy this Monday.  On Decadron for vasogenic edema  History of  high-grade carcinoma of right parotid gland-right parotidectomy (March 2021) followed by postoperative radiation (4/26 2021 through 10/27/18/2021)-complicated by right facial paralysis.  GERD: Continue PPI  Anxiety/depression: Stable-on Paxil      Condition - Extremely Guarded  Family Communication  : Spouse at bedside on 7/2  Code Status :  Full  Consults  :  Neuro, N.Surg, N.Onco, IR  PUD Prophylaxis : PPI       Disposition Plan  :    Status is: Inpatient  Remains inpatient appropriate because:IV treatments appropriate due to intensity of illness or inability to take PO  Dispo: The patient is from: Home              Anticipated d/c is to: SNF              Patient currently is not medically stable to d/c.   Difficult to place patient No  DVT Prophylaxis  :  SCDs-as possible craniotomy/brain biopsy on Monday  Place and maintain sequential compression device Start: 11/18/20 1226  Lab Results  Component Value Date   PLT 234 11/20/2020    Diet :  Diet Order             DIET DYS 3 Room service appropriate? Yes; Fluid consistency: Thin  Diet effective now                    Inpatient Medications  Scheduled Meds:  dexamethasone (DECADRON) injection  4 mg Intravenous Q6H   pantoprazole  40 mg Oral Daily   PARoxetine  40 mg Oral Daily   phenytoin (DILANTIN) IV  100 mg Intravenous Q8H   Continuous Infusions:  acyclovir 680 mg (11/20/20 1244)   levETIRAcetam 1,000 mg (11/20/20 0948)   PRN Meds:.acetaminophen, docusate sodium, LORazepam, [DISCONTINUED] ondansetron **OR** ondansetron (ZOFRAN) IV, polyvinyl alcohol, traZODone  Antibiotics  :    Anti-infectives (From admission, onward)    Start     Dose/Rate Route Frequency Ordered Stop   11/17/20 1930  acyclovir (ZOVIRAX) 680 mg in dextrose 5 % 100 mL IVPB        10 mg/kg  68 kg 113.6 mL/hr over 60 Minutes Intravenous Every 8 hours 11/17/20 1857          Time Spent in minutes  30   Oren Binet  M.D on 11/20/2020 at 2:06 PM  To page go to [email or web address]   [facility] -  Office  [phone]    See all Orders from today for further details    Objective:   Vitals:   11/20/20 0400 11/20/20 0500 11/20/20 0835 11/20/20 1204  BP: 126/70  123/73 114/64  Pulse: 83 78 84 88  Resp: 18  18 (!) 21  Temp: 98.7 F (37.1 C)  99.6 F (37.6 C) 99.4 F (37.4 C)  TempSrc:   Oral Oral  SpO2: 94% 96% 96% 96%  Weight:      Height:        Wt Readings from Last 3 Encounters:  11/17/20 68 kg  10/26/20 68 kg  05/28/20 68.5 kg     Intake/Output Summary (Last 24 hours) at 11/20/2020 1406 Last data filed at 11/20/2020 1638 Gross per 24 hour  Intake 599.38 ml  Output 1300 ml  Net -700.62 ml      Physical Exam Gen Exam:Alert awake-not in any distress HEENT:atraumatic, normocephalic Chest: B/L clear to auscultation anteriorly CVS:S1S2 regular Abdomen:soft non tender, non distended Extremities:no edema Neurology: Non focal-chronic right facial droop. Skin: no rash         Data Review:    CBC Recent Labs  Lab 11/17/20 1210 11/17/20 1224 11/19/20 0048 11/20/20 0132  WBC 6.6  --  10.1 6.7  HGB 14.2 15.3* 12.3 12.7  HCT 44.4 45.0 38.0 37.8  PLT 313  --  255 234  MCV 94.5  --  92.2 89.6  MCH 30.2  --  29.9 30.1  MCHC 32.0  --  32.4 33.6  RDW 13.2  --  13.2 13.0  LYMPHSABS 0.6*  --  1.0 1.7  MONOABS 0.3  --  0.6 0.6  EOSABS  0.0  --  0.0 0.0  BASOSABS 0.1  --  0.0 0.0     Recent Labs  Lab 11/17/20 1210 11/17/20 1219 11/17/20 1224 11/17/20 1416 11/18/20 0141 11/18/20 0840 11/19/20 0048 11/20/20 0132  NA 136  --  138  --  135  --  138 137  K 4.2  --  4.2  --  3.7  --  3.5 3.7  CL 102  --  103  --  102  --  102 102  CO2 22  --   --   --  27  --  30 29  GLUCOSE 197*  --  198*  --  126*  --  101* 98  BUN 12  --  15  --  5*  --  6 8  CREATININE 0.73  --  0.60  --  0.57  --  0.63 0.63  CALCIUM 9.1  --   --   --  8.8*  --  8.8* 8.8*  AST 31  --   --   --    --   --  19 17  ALT 21  --   --   --   --   --  16 18  ALKPHOS 81  --   --   --   --   --  61 61  BILITOT 0.3  --   --   --   --   --  0.6 0.5  ALBUMIN 4.1  --   --   --   --   --  3.5 3.4*  MG  --   --   --   --   --   --  2.1 2.0  LATICACIDVEN  --  4.5*  --  1.3  --   --   --   --   INR 0.9  --   --   --   --  1.0  --   --   HGBA1C 5.8*  --   --   --   --   --   --   --   BNP  --   --   --   --   --   --  88.5 31.5     ------------------------------------------------------------------------------------------------------------------ No results for input(s): CHOL, HDL, LDLCALC, TRIG, CHOLHDL, LDLDIRECT in the last 72 hours.  Lab Results  Component Value Date   HGBA1C 5.8 (H) 11/17/2020   ------------------------------------------------------------------------------------------------------------------ No results for input(s): TSH, T4TOTAL, T3FREE, THYROIDAB in the last 72 hours.  Invalid input(s): FREET3  Cardiac Enzymes No results for input(s): CKMB, TROPONINI, MYOGLOBIN in the last 168 hours.  Invalid input(s): CK ------------------------------------------------------------------------------------------------------------------    Component Value Date/Time   BNP 31.5 11/20/2020 0132    Micro Results Recent Results (from the past 240 hour(s))  Resp Panel by RT-PCR (Flu A&B, Covid) Nasopharyngeal Swab     Status: None   Collection Time: 11/17/20 12:15 PM   Specimen: Nasopharyngeal Swab; Nasopharyngeal(NP) swabs in vial transport medium  Result Value Ref Range Status   SARS Coronavirus 2 by RT PCR NEGATIVE NEGATIVE Final    Comment: (NOTE) SARS-CoV-2 target nucleic acids are NOT DETECTED.  The SARS-CoV-2 RNA is generally detectable in upper respiratory specimens during the acute phase of infection. The lowest concentration of SARS-CoV-2 viral copies this assay can detect is 138 copies/mL. A negative result does not preclude SARS-Cov-2 infection and should not be used as  the sole basis for treatment or other patient management decisions. A  negative result may occur with  improper specimen collection/handling, submission of specimen other than nasopharyngeal swab, presence of viral mutation(s) within the areas targeted by this assay, and inadequate number of viral copies(<138 copies/mL). A negative result must be combined with clinical observations, patient history, and epidemiological information. The expected result is Negative.  Fact Sheet for Patients:  EntrepreneurPulse.com.au  Fact Sheet for Healthcare Providers:  IncredibleEmployment.be  This test is no t yet approved or cleared by the [place] FDA and  has been authorized for detection and/or diagnosis of SARS-CoV-2 by FDA under an Emergency Use Authorization (EUA). This EUA will remain  in effect (meaning this test can be used) for the duration of the COVID-19 declaration under Section 564(b)(1) of the Act, 21 U.S.C.section 360bbb-3(b)(1), unless the authorization is terminated  or revoked sooner.       Influenza A by PCR NEGATIVE NEGATIVE Final   Influenza B by PCR NEGATIVE NEGATIVE Final    Comment: (NOTE) The Xpert Xpress SARS-CoV-2/FLU/RSV plus assay is intended as an aid in the diagnosis of influenza from Nasopharyngeal swab specimens and should not be used as a sole basis for treatment. Nasal washings and aspirates are unacceptable for Xpert Xpress SARS-CoV-2/FLU/RSV testing.  Fact Sheet for Patients: EntrepreneurPulse.com.au  Fact Sheet for Healthcare Providers: IncredibleEmployment.be  This test is not yet approved or cleared by the [place] FDA and has been authorized for detection and/or diagnosis of SARS-CoV-2 by FDA under an Emergency Use Authorization (EUA). This EUA will remain in effect (meaning this test can be used) for the duration of the COVID-19 declaration under Section 564(b)(1) of  the Act, 21 U.S.C. section 360bbb-3(b)(1), unless the authorization is terminated or revoked.  Performed at [facility], [place] [address], [place], [place] [address]   CSF culture w Gram Stain     Status: None (Preliminary result)   Collection Time: 11/19/20 11:50 AM   Specimen: PATH Cytology CSF; Cerebrospinal Fluid  Result Value Ref Range Status   Specimen Description CSF  Final   Special Requests NONE  Final   Gram Stain   Final    WBC PRESENT, PREDOMINANTLY MONONUCLEAR NO ORGANISMS SEEN CYTOSPIN SMEAR    Culture   Final    NO GROWTH < 24 HOURS Performed at [facility], [place] [address] [place], [place] [address]    Report Status PENDING  Incomplete    Radiology Reports CT HEAD WO CONTRAST  Result Date: 11/17/2020 CLINICAL DATA:  Altered mental status following a syncopal episode. Seizure-like activity. Possible stroke. History of right-sided parotid carcinoma. EXAM: CT HEAD WITHOUT CONTRAST TECHNIQUE: Contiguous axial images were obtained from the base of the skull through the vertex without intravenous contrast. COMPARISON:  Head MRI 06/25/2019 FINDINGS: Brain: There is a new moderate-sized region of vasogenic edema involving right temporal lobe white matter. There are a few small foci of calcification along the inferior aspect of the right temporal lobe which may be associated with some abnormal soft tissue in this region, however a discrete mass is not well demonstrated on this unenhanced study. There is mild local mass effect without midline shift. No acute cortically based infarct, intracranial hemorrhage, or extra-axial fluid collection is identified. The ventricles are normal in size. Vascular: Calcified atherosclerosis at the skull base. No hyperdense vessel. Skull: No fracture or suspicious osseous lesion. Sinuses/Orbits: Partially visualized mucous retention cysts in the right maxillary sinus. New moderate-sized right mastoid effusion. Right-sided eyelid weight.  Other: Partially visualized sequelae of right parotidectomy.  IMPRESSION: 1. New moderate-sized region of vasogenic edema in the right temporal lobe, suspicious for metastatic disease with this history. Brain MRI without and with contrast is recommended for further evaluation. 2. New right mastoid effusion. These results were called by telephone at the time of interpretation on 11/17/2020 at 12:50 pm to Dr. Sherwood Gambler, who verbally acknowledged these results. Electronically Signed   By: Logan Bores M.D.   On: 11/17/2020 12:51   MR Brain W and Wo Contrast  Result Date: 11/17/2020 CLINICAL DATA:  Provided history: Brain mass or lesion. EXAM: MRI HEAD WITHOUT AND WITH CONTRAST TECHNIQUE: Multiplanar, multiecho pulse sequences of the brain and surrounding structures were obtained without and with intravenous contrast. CONTRAST:  50mL GADAVIST GADOBUTROL 1 MMOL/ML IV SOLN COMPARISON:  Prior head CT examinations 11/17/2020 and earlier. Brain MRI 06/25/2019. FINDINGS: Brain: Mild intermittent motion degradation. New from the prior brain MRI of 06/25/2019, there is a 2.4 x 1.4 x 0.8 cm peripherally enhancing focus within the inferior right temporal lobe (for instance as seen on series 11, image 18) (series 9, image 15). Also new from this prior examination, there is a moderate-sized focus of vasogenic edema within the right temporal lobe extending to the right temporal stem. Diffusion-weighted signal abnormality is present within the cortex of the anterior right temporal lobe, within the right hippocampus and within the right temporal stem. Persistent asymmetric enhancement along the mastoid segment of the right facial nerve (for instance as seen on series 11, image 12). New from the prior MRI, there is asymmetric enhancement along the inferomedial aspect of the right middle cranial fossa (series 11, image 15). Mild multifocal T2/FLAIR hyperintensity elsewhere within the cerebral white matter and within the pons,  nonspecific but compatible with chronic small vessel ischemic disease. No chronic intracranial blood products. No extra-axial fluid collection. No midline shift. Vascular: Expected proximal arterial flow voids. Skull and upper cervical spine: No focal marrow lesion. Sinuses/Orbits: Visualized orbits show no acute finding. 2.8 cm right maxillary sinus mucous retention cyst. Other: Large right mastoid effusion. Sequela of interval right parotid resection. These results were called by telephone at the time of interpretation on 11/17/2020 at 5:47 pm to provider Dr.Zhang, who verbally acknowledged these results. IMPRESSION: 2.4 x 1.4 x 0.8 cm peripherally enhancing focus within the inferior right temporal lobe, new as compared to the brain MRI of 06/25/2019. Also new from this prior exam, there is moderate vasogenic edema within the right temporal lobe, extending to the right temporal stem. These findings may reflect necrosis and edema related to prior radiation therapy. However, a right temporal lobe metastasis cannot be excluded. Neuro-Oncology consultation is recommended. Diffusion-weighted signal abnormality within the cortex of the anterior right temporal lobe, within the right hippocampus and within the medial right thalamus. Findings are nonspecific. Primary considerations are encephalitis (including herpes encephalitis), seizure-related changes or acute ischemia. Sequela of interval right parotid resection. Persistent asymmetric enhancement of the mastoid segment of the right facial nerve. Residual perineural tumor at this site cannot be excluded. New from the prior brain MRI, there is subtle asymmetric enhancement along the inferomedial aspect of the right middle cranial fossa, which could reflect additional perineural tumor spread (along the greater superficial petrosal nerve). Mild chronic small vessel ischemic changes within the cerebral white matter and pons. Large right mastoid effusion. Large right  maxillary sinus mucous retention cyst. Electronically Signed   By: Kellie Simmering DO   On: 11/17/2020 17:57   MR SPECTROSCOPY  Result Date: 11/19/2020 CLINICAL DATA:  60 year old female with a history of parotid carcinoma with imaging evidence of tumor extension along the right 7th nerve to the skull base. Parotid resection and radiation. Presenting with seizure and found to have new enhancement and vasogenic edema in the right temporal lobe. MRI spectroscopy requested. EXAM: MRI HEAD WITHOUT CONTRAST TECHNIQUE: Multiplanar, multiecho pulse sequences of the brain and surrounding structures were obtained without intravenous contrast. COMPARISON:  Post treatment brain MRI 11/17/2020. Pretreatment brain MRI 06/25/2019. FINDINGS: Multi voxel type MR spectroscopy was performed at 3 Tesla in regions of interest in the: -abnormal right temporal lobe (inferior temporal dark T2 region corresponding to enhancement, and more posterior temporal lobe region of edema) And -contralateral normal left temporal lobe. Contralateral left temporal lobe spectroscopy demonstrates expected normal peaks of creatine, choline, and N-acetyl aspartate (such as on series 13). In the dark T2 inferior temporal region corresponding to the enhancing portion of the lesion on recent MRI the tracing is abnormal, with consistently elevated peaks to the right of NAA likely corresponding to combination of lipids and lactate. But varying levels of Cr and Cho ranging from relatively normal (series 15) to abnormally elevated (series 10). In the more posterior temporal lobe region which most resembles edema on anatomic MRI the tracing resembles the more normal contralateral left lobe (Mildly elevated CR and Cho with fairly normal NAA but also elevated lipid/lactate (series 1450). IMPRESSION: 1. Abnormal spectroscopy at the inferior right temporal lobe, area of dark T2 signal corresponding to enhancement on the recent anatomic MRI. Increased lipid/lactate  there which can be a marker for cell death/necrosis, but can also be seen in brain metastases. Also questionable elevation of Cr/Cho is present, which can be seen with tumor hypercellularity. 2. The posterior right temporal lobe area most resembling edema on the recent anatomic MRI has a relatively normal spectroscopy. 3. Spectroscopy favors radiation necrosis over a right temporal lobe metastasis, but additional advanced imaging correlation is recommended. Given the proximity of the temporal lobe enhancement to the skull base, Brain PET would likely be more successful than MR Perfusion imaging. Electronically Signed   By: Genevie Ann M.D.   On: 11/19/2020 12:03   CT CHEST ABDOMEN PELVIS W CONTRAST  Result Date: 11/18/2020 CLINICAL DATA:  Staging for possible brain neoplasm. EXAM: CT CHEST, ABDOMEN, AND PELVIS WITH CONTRAST TECHNIQUE: Multidetector CT imaging of the chest, abdomen and pelvis was performed following the standard protocol during bolus administration of intravenous contrast. CONTRAST:  136mL OMNIPAQUE IOHEXOL 300 MG/ML  SOLN COMPARISON:  March 21, 2020.  August 15, 2004. FINDINGS: CT CHEST FINDINGS Cardiovascular: Atherosclerosis of thoracic aorta is noted without aneurysm or dissection. Normal cardiac size. No pericardial effusion. Mediastinum/Nodes: 3.4 cm right thyroid mass is noted which is enlarged compared to prior exam. Esophagus is unremarkable. No adenopathy is noted. Lungs/Pleura: No pneumothorax or pleural effusion is noted. Mild bilateral posterior basilar subsegmental atelectasis is noted. Musculoskeletal: No chest wall mass or suspicious bone lesions identified. CT ABDOMEN PELVIS FINDINGS Hepatobiliary: No focal liver abnormality is seen. Status post cholecystectomy. No biliary dilatation. Pancreas: Unremarkable. No pancreatic ductal dilatation or surrounding inflammatory changes. Spleen: Normal in size without focal abnormality. Adrenals/Urinary Tract: Adrenal glands are unremarkable.  Kidneys are normal, without renal calculi, focal lesion, or hydronephrosis. Bladder is unremarkable. Stomach/Bowel: Stomach is within normal limits. Appendix appears normal. No evidence of bowel wall thickening, distention, or inflammatory changes. Vascular/Lymphatic: Aortic atherosclerosis. No enlarged abdominal or pelvic lymph nodes. Reproductive: Status post hysterectomy. No adnexal masses. Other: No abdominal wall  hernia or abnormality. No abdominopelvic ascites. Musculoskeletal: No acute or significant osseous findings. IMPRESSION: 3.4 cm right thyroid nodule or mass is noted which is enlarged compared to prior exam. Recommend thyroid US. (Ref: J Am Coll Radiol. 2015 Feb;12(2): 143-50). No other significant abnormality seen in the chest, abdomen or pelvis. Aortic Atherosclerosis (ICD10-I70.0). Electronically Signed   By: Marijo Conception M.D.   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 EEG to evaluate for seizure Level of alertness: Awake AEDs during EEG study: LEV Technical aspects: This EEG study was done with scalp electrodes positioned according to the 10-20 International system of electrode placement. Electrical activity was acquired at a sampling rate of 500Hz  and reviewed with a high frequency filter of 70Hz  and a low frequency filter of 1Hz . EEG data were recorded continuously and digitally stored. Description: No posterior dominant rhythm was seen. EEG showed continuous generalized and lateralized polymorphic sharply contoured 5 to 9 Hz theta-alpha activity.  Continuous 2-3hz  delta slowing was also noted in right temporo-parietal region. Lateralized periodic discharges were noted in right hemisphere, maximal ight temporo-parietal region, at 1hz  with superimposed rhythmicity at times.  Hyperventilation and photic stimulation were not performed.   ABNORMALITY - Lateralized periodic discharges with overriding rhythmic activity ( LPD +) right, maximal temporo-parietal region - Continuous slow, generalized and maximal right temporo-parietal region. IMPRESSION: This study howed evidence of epileptogenicity as well as cortical dysfunction arising from right temporo-parietal region likely secondary to underlying structural abnormality/ mass. The lateralized periodic discharges with overriding rhythmic activity are on the ictal-interictal continuum with high potential for seizures, There is also moderate diffuse encephalopathy, nonspecific etiology. No seizures were seen throughout the recording. Dr Rory Percy was notified. Priyanka [name]   Overnight EEG with video  Result Date: 11/18/2020 Lora Havens, MD     11/19/2020  8:34 AM Patient Name: Ashara Lounsbury MRN: [id number] Epilepsy Attending: Lora Havens Referring Physician/Provider: Dr Amie Portland Duration: 11/17/2020 1825 to 11/18/2020 1142, 1949  to 11/19/2020 0300  Patient history: 60 year old with past medical history of a 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 EEG to evaluate for seizure  Level of alertness: Awake, asleep  AEDs during EEG study: LEV, PHT  Technical aspects: This EEG study was done with scalp electrodes positioned according to the 10-20 International system of electrode placement. Electrical activity was acquired at a sampling rate of 500Hz  and reviewed with a high frequency filter of 70Hz  and a low frequency filter of 1Hz . EEG data were recorded continuously and digitally  stored.  Description: No posterior dominant rhythm was seen. EEG showed continuous generalized and lateralized polymorphic sharply contoured 5 to 9 Hz theta-alpha activity. Continuous 2-3hz  delta slowing was also noted in right temporo-parietal region. Lateralized periodic discharges were noted in right hemisphere, maximal ight temporo-parietal region, at 0.5-1hz  with superimposed rhythmicity at times.  Seizures without clinical signs were also noted arising from right temporoparietal region, average 2-3 seizures per hour, lasting about 15 to 45 seconds each.  EEG was interrupted between  11/18/2020 1142 to 1949 for procedures.  After 1949, EEG continued to show lateralized periodic discharges in right hemisphere, maximal right temporoparietal region.  Brief ictal-interictal rhythmic discharges were also noted in right temporoparietal region.  ABNORMALITY -Seizure without clinical signs, right temporoparietal region -Brief ictal-interictal rhythmic discharges, right temporoparietal region (BIRDs) - Lateralized periodic discharges with overriding rhythmic activity ( LPD +) right, maximal temporo-parietal region - Continuous slow, generalized and maximal right temporo-parietal region.  IMPRESSION: This study showed seizures without clinical signs arising from right temporoparietal region, average 2- 3/hour, lasting about 15 to 45 seconds each.  After 1949 on 11/18/2020, EEG showed brief ictal-interictal rhythmic discharges arising from right temporoparietal region in addition to lateralized periodic discharges with overriding rhythmic activity which are on the ictal-interictal continuum with high potential for seizures.  There is also evidence of cortical dysfunction arising from right temporo-parietal region likely secondary to underlying structural abnormality/ mass.  Additionally, there is moderate diffuse encephalopathy, nonspecific etiology.  Priyanka [name]   DG FL GUIDED LUMBAR PUNCTURE  Result Date:  11/19/2020 CLINICAL DATA:  60 year old female with mental status changes, for lumbar puncture. EXAM: DIAGNOSTIC LUMBAR PUNCTURE UNDER FLUOROSCOPIC GUIDANCE COMPARISON:  Prior exams FLUOROSCOPY TIME:  Fluoroscopy Time:  48 seconds Number of Acquired Spot Images: 2 PROCEDURE: Informed consent was obtained from the patient prior to the procedure, including potential complications of headache, allergy, and pain. With the patient prone, the lower back was prepped with Betadine. 1% Lidocaine was used for local anesthesia. Lumbar puncture was performed at the L4-5 level using a 20 gauge needle with return of clear colorless CSF with an opening pressure of 13 cm water. Sixteen ml of CSF were obtained for laboratory studies. The patient tolerated the procedure well and there were no apparent complications. IMPRESSION: Fluoroscopic guided lumbar puncture.  No immediate complication. Electronically Signed   By: Margarette Canada M.D.   On: 11/19/2020 11:54   CT STEALTH HEAD W/O CONTRAST  Result Date: 11/18/2020 CLINICAL DATA:  Brain mass. Surgical planning. Right parotid malignancy EXAM: CT HEAD WITHOUT CONTRAST TECHNIQUE: Contiguous axial images were obtained from the base of the skull through the vertex without intravenous contrast. COMPARISON:  CT and MRI head 11/17/2020 FINDINGS: Brain: Mass lesion in the right temporal lobe contains small calcifications and white matter edema throughout the right temporal lobe. No change from yesterday. No associated hemorrhage. Ventricle size normal. No midline shift. No acute infarct. No other mass lesion identified. There is streak artifact from metallic implant in the right eyelid. Vascular: Negative for hyperdense vessel Skull: Negative Sinuses/Orbits: Retention cyst right maxillary sinus. Remaining paranasal sinuses clear. Right mastoid effusion. Left mastoid clear. Metallic implant in the right eyelid. Other: Right parotidectomy. IMPRESSION: Mass lesion right temporal lobe unchanged.  Mass contains small areas of calcification diffuse white matter edema. No change from yesterday. Electronically Signed   By: Franchot Gallo M.D.   On: 11/18/2020 12:59

## 2020-11-20 NOTE — Progress Notes (Signed)
vLTM EEG complete. No skin breakdown 

## 2020-11-20 NOTE — Procedures (Addendum)
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 EEG to evaluate for seizure   Level of alertness: Awake, asleep   AEDs during EEG study: LEV, PHT   Technical aspects: This EEG study was done with scalp electrodes positioned according to the 10-20 International system of electrode placement. Electrical activity was acquired at a sampling rate of 500Hz  and reviewed with a high frequency filter of 70Hz  and a low frequency filter of 1Hz . EEG data were recorded continuously and digitally stored.   Description: No posterior dominant rhythm was seen. EEG showed continuous generalized and lateralized polymorphic sharply contoured 5 to 9 Hz theta-alpha activity. Continuous 2-3hz  delta slowing was also noted in right temporo-parietal region. Lateralized periodic discharges were noted in right hemisphere, maximal ight temporo-parietal region, at 0.5-1hz  with superimposed rhythmicity at times.      ABNORMALITY - Lateralized periodic discharges with overriding rhythmic activity ( LPD +) right, maximal temporo-parietal region - Continuous slow, generalized and maximal right temporo-parietal region.   IMPRESSION: This study showed evidence of epileptogenicity as well as cortical dysfunction arising from right temporo-parietal region likely secondary to underlying structural abnormality/ mass. The lateralized periodic discharges with overriding rhythmic activity are on the ictal-interictal continuum with high potential for seizures, There is also moderate diffuse encephalopathy, nonspecific etiology. No seizures were seen throughout the recording.  EEG appears to be  improving compared to previous day.    [name] [name]

## 2020-11-20 NOTE — Progress Notes (Signed)
Physical Therapy Treatment Patient Details Name: Melissa Case MRN: [id number] DOB: November 03, 1960 Today's Date: 11/20/2020    History of Present Illness Pt is a 60 year old female admitted 6/29 that presented after a seizure.  She was on her way to work when she had a seizure episode that her husband witnessed.  CT revealed right temporal mass suspicious for metastatic lesion.  EEG in place on evaluation.  Neuro following pt. PMH:  parotid cancer, status post resection and radiation in 2021    PT Comments    Pt with improved mobility. Should be able to return home with family once medically ready. Will continue to follow and try to progress away from assistive device.    Follow Up Recommendations  Home health PT;Supervision for mobility/OOB     Equipment Recommendations  Other (comment) (Possibly rollator but may progress to no device)    Recommendations for Other Services       Precautions / Restrictions Precautions Precautions: Fall    Mobility  Bed Mobility Overal bed mobility: Needs Assistance Bed Mobility: Supine to Sit     Supine to sit: HOB elevated;Supervision     General bed mobility comments: supervision for safety    Transfers Overall transfer level: Needs assistance Equipment used: 4-wheeled walker Transfers: Sit to/from [employer] Transfers Sit to Stand: Min guard Stand pivot transfers: Min guard       General transfer comment: assist for safety and lines  Ambulation/Gait Ambulation/Gait assistance: Min guard Gait Distance (Feet): 170 Feet Assistive device: 4-wheeled walker Gait Pattern/deviations: Step-through pattern;Decreased stride length Gait velocity: decr Gait velocity interpretation: >2.62 ft/sec, indicative of community ambulatory General Gait Details: assist for safety and lines   Stairs             Wheelchair Mobility    Modified Rankin (Stroke Patients Only)       Balance Overall balance assessment: Needs  assistance Sitting-balance support: Feet supported;No upper extremity supported Sitting balance-Leahy Scale: Good     Standing balance support: During functional activity;No upper extremity supported Standing balance-Leahy Scale: Fair                              Cognition Arousal/Alertness: Awake/alert Behavior During Therapy: WFL for tasks assessed/performed Overall Cognitive Status: Impaired/Different from baseline Area of Impairment: Safety/judgement;Problem solving;Attention;Memory                   Current Attention Level: Selective Memory: Decreased short-term memory   Safety/Judgement: Decreased awareness of safety;Decreased awareness of deficits   Problem Solving: Requires verbal cues        Exercises      General Comments General comments (skin integrity, edema, etc.): VSS on RA      Pertinent Vitals/Pain Pain Assessment: No/denies pain    Home Living                      Prior Function            PT Goals (current goals can now be found in the care plan section) Acute Rehab PT Goals Patient Stated Goal: to go home PT Goal Formulation: With patient Time For Goal Achievement: 12/02/20 Potential to Achieve Goals: Good Progress towards PT goals: Progressing toward goals;Goals met and updated - see care plan    Frequency    Min 3X/week      PT Plan Discharge plan needs to be updated    Co-evaluation  AM-PAC PT "6 Clicks" Mobility   Outcome Measure  Help needed turning from your back to your side while in a flat bed without using bedrails?: None Help needed moving from lying on your back to sitting on the side of a flat bed without using bedrails?: A Little Help needed moving to and from a bed to a chair (including a wheelchair)?: A Little Help needed standing up from a chair using your arms (e.g., wheelchair or bedside chair)?: A Little Help needed to walk in hospital room?: A Little Help needed  climbing 3-5 steps with a railing? : A Little 6 Click Score: 19    End of Session Equipment Utilized During Treatment: Gait belt Activity Tolerance: Patient tolerated treatment well Patient left: with call bell/phone within reach;with family/visitor present;in chair;with chair alarm set;with nursing/sitter in room Nurse Communication: Mobility status PT Visit Diagnosis: Muscle weakness (generalized) (M62.81);Unsteadiness on feet (R26.81)     Time: 7793-9030 PT Time Calculation (min) (ACUTE ONLY): 27 min  Charges:  $Gait Training: 23-37 mins                     Newton Hamilton Pager [phone] Office [place] 11/20/2020, 2:25 PM

## 2020-11-20 NOTE — Progress Notes (Signed)
  NEUROSURGERY PROGRESS NOTE   No issues overnight.   EXAM:  BP 114/64 (BP Location: Left Arm)   Pulse 88   Temp 99.4 F (37.4 C) (Oral)   Resp (!) 21   Ht $R'5\' 5"'nW$  (1.651 m)   Wt 68 kg   SpO2 96%   BMI 24.95 kg/m   Awake, alert, oriented  Speech fluent Right facial droop 5/5 BUE/BLE   IMPRESSION:  60 y.o. female with right temporal mass, rad necrosis vs met. Infectious etiology seems less likely PLED on EEG  PLAN: - Await final LP results - culture/cytology pending - May require biopsy of lesion this week per Dr. Reatha Armour - Cont AEDs per neurology   Consuella Lose, MD [facility]

## 2020-11-21 LAB — CBC WITH DIFFERENTIAL/PLATELET
Abs Immature Granulocytes: 0.01 10*3/uL (ref 0.00–0.07)
Basophils Absolute: 0 10*3/uL (ref 0.0–0.1)
Basophils Relative: 0 %
Eosinophils Absolute: 0 10*3/uL (ref 0.0–0.5)
Eosinophils Relative: 0 %
HCT: 40.8 % (ref 36.0–46.0)
Hemoglobin: 13.7 g/dL (ref 12.0–15.0)
Immature Granulocytes: 0 %
Lymphocytes Relative: 24 %
Lymphs Abs: 1.6 10*3/uL (ref 0.7–4.0)
MCH: 30 pg (ref 26.0–34.0)
MCHC: 33.6 g/dL (ref 30.0–36.0)
MCV: 89.5 fL (ref 80.0–100.0)
Monocytes Absolute: 0.5 10*3/uL (ref 0.1–1.0)
Monocytes Relative: 8 %
Neutro Abs: 4.3 10*3/uL (ref 1.7–7.7)
Neutrophils Relative %: 68 %
Platelets: 208 10*3/uL (ref 150–400)
RBC: 4.56 MIL/uL (ref 3.87–5.11)
RDW: 13 % (ref 11.5–15.5)
WBC: 6.5 10*3/uL (ref 4.0–10.5)
nRBC: 0 % (ref 0.0–0.2)

## 2020-11-21 LAB — COMPREHENSIVE METABOLIC PANEL
ALT: 17 U/L (ref 0–44)
AST: 18 U/L (ref 15–41)
Albumin: 3.6 g/dL (ref 3.5–5.0)
Alkaline Phosphatase: 65 U/L (ref 38–126)
Anion gap: 9 (ref 5–15)
BUN: 10 mg/dL (ref 6–20)
CO2: 26 mmol/L (ref 22–32)
Calcium: 9 mg/dL (ref 8.9–10.3)
Chloride: 101 mmol/L (ref 98–111)
Creatinine, Ser: 0.62 mg/dL (ref 0.44–1.00)
GFR, Estimated: >60 mL/min (ref >60)
Glucose, Bld: 91 mg/dL (ref 70–99)
Potassium: 3.6 mmol/L (ref 3.5–5.1)
Sodium: 136 mmol/L (ref 135–145)
Total Bilirubin: 0.7 mg/dL (ref 0.3–1.2)
Total Protein: 6.5 g/dL (ref 6.5–8.1)

## 2020-11-21 LAB — MAGNESIUM: Magnesium: 2 mg/dL (ref 1.7–2.4)

## 2020-11-21 LAB — BRAIN NATRIURETIC PEPTIDE: B Natriuretic Peptide: 14 pg/mL (ref 0.0–100.0)

## 2020-11-21 NOTE — Progress Notes (Signed)
PROGRESS NOTE                                                                                                                                                                                                             Patient Demographics:    Melissa Case, is a 60 y.o. female, DOB - February 01, 1961, WUG:[id number]  Outpatient Primary MD for the patient is Patient, No Pcp Per (Inactive)    LOS - 4  Admit date - 11/17/2020    Chief Complaint  Patient presents with   Altered Mental Status       Brief Narrative 60 year old with past history of  high-grade carcinoma of right parotid gland-s/p right parotidectomy on 07/22/2019-and postoperative radiation-presenting with seizure-like activity.    Significant studies: 6/29>> CT head: Moderate size vasogenic edema in the right temporal lobe 6/29>> MRI brain: 2.4 x 1.4 x 0.8 peripherally enhancing focus in the right temporal lobe with vasogenic edema. 6/29>> spot EEG :epileptogenicity arising from right temporo-parietal region 6/29-6/30>> LTM EEG: Seizure arising from right temporoparietal region 7/1>> CSF oligoclonal bands: Pending  Procedures: 7/1>> LP  Culture data: CSF culture: No growth CSF HSV PCR: Pending CSF VDRL: Pending CSF fungal culture: Pending  Cytology: CSF cytology: Pending    Subjective:   Lying comfortably in bed-no chest pain-no shortness of breath.  Awake/alert.   Assessment  & Plan :    Seizures: MRI brain with right temporal lesion-neurology following-continue Keppra/phenytoin.  No further seizures-most recent EEG stable.    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  Tentatively scheduled for brain biopsy this Monday.  On Decadron for vasogenic edema  History of high-grade  carcinoma of right parotid gland-right parotidectomy (March 2021) followed by postoperative radiation (4/26 2021 through 10/27/18/2021)-complicated by right facial paralysis.  GERD: Continue PPI  Anxiety/depression: Stable-on Paxil      Condition - Extremely Guarded  Family Communication  : Spouse at bedside on 7/2  Code Status :  Full  Consults  :  Neuro, N.Surg, N.Onco, IR  PUD Prophylaxis : PPI       Disposition Plan  :    Status is: Inpatient  Remains inpatient appropriate because:IV treatments appropriate due to intensity of illness or inability to take PO  Dispo:  The patient is from: Home              Anticipated d/c is to: SNF              Patient currently is not medically stable to d/c.   Difficult to place patient No  DVT Prophylaxis  :  SCDs-as possible craniotomy/brain biopsy on Monday  Place and maintain sequential compression device Start: 11/18/20 1226  Lab Results  Component Value Date   PLT 208 11/21/2020    Diet :  Diet Order             DIET DYS 3 Room service appropriate? Yes; Fluid consistency: Thin  Diet effective now                    Inpatient Medications  Scheduled Meds:  dexamethasone (DECADRON) injection  4 mg Intravenous Q6H   nicotine  21 mg Transdermal Daily   pantoprazole  40 mg Oral Daily   PARoxetine  40 mg Oral Daily   phenytoin (DILANTIN) IV  100 mg Intravenous Q8H   Continuous Infusions:  acyclovir 680 mg (11/21/20 0624)   levETIRAcetam 1,000 mg (11/21/20 0937)   PRN Meds:.acetaminophen, docusate sodium, LORazepam, [DISCONTINUED] ondansetron **OR** ondansetron (ZOFRAN) IV, polyvinyl alcohol, traZODone  Antibiotics  :    Anti-infectives (From admission, onward)    Start     Dose/Rate Route Frequency Ordered Stop   11/17/20 1930  acyclovir (ZOVIRAX) 680 mg in dextrose 5 % 100 mL IVPB        10 mg/kg  68 kg 113.6 mL/hr over 60 Minutes Intravenous Every 8 hours 11/17/20 1857          Time Spent in minutes   30   Oren Binet M.D on 11/21/2020 at 12:56 PM  To page go to [email or web address]   [facility] -  Office  [phone]    See all Orders from today for further details    Objective:   Vitals:   11/21/20 0000 11/21/20 0500 11/21/20 0802 11/21/20 1205  BP:  (!) 150/72 113/66 128/64  Pulse: 82 79 93 87  Resp: 18 20 18 15   Temp:  98.7 F (37.1 C) 99 F (37.2 C) 98.8 F (37.1 C)  TempSrc:  Oral Oral Oral  SpO2: 96% 95% 96% 96%  Weight:  72 kg    Height:        Wt Readings from Last 3 Encounters:  11/21/20 72 kg  10/26/20 68 kg  05/28/20 68.5 kg     Intake/Output Summary (Last 24 hours) at 11/21/2020 1256 Last data filed at 11/21/2020 0100 Gross per 24 hour  Intake 565.21 ml  Output 200 ml  Net 365.21 ml      Physical Exam Gen Exam:Alert awake-not in any distress HEENT:atraumatic, normocephalic Chest: B/L clear to auscultation anteriorly CVS:S1S2 regular Abdomen:soft non tender, non distended Extremities:no edema Neurology: Chronic right facial droop-otherwise nonfocal. Skin: no rash         Data Review:    CBC Recent Labs  Lab 11/17/20 1210 11/17/20 1224 11/19/20 0048 11/20/20 0132 11/21/20 0117  WBC 6.6  --  10.1 6.7 6.5  HGB 14.2 15.3* 12.3 12.7 13.7  HCT 44.4 45.0 38.0 37.8 40.8  PLT 313  --  255 234 208  MCV 94.5  --  92.2 89.6 89.5  MCH 30.2  --  29.9 30.1 30.0  MCHC 32.0  --  32.4 33.6 33.6  RDW 13.2  --  13.2 13.0 13.0  LYMPHSABS 0.6*  --  1.0 1.7 1.6  MONOABS 0.3  --  0.6 0.6 0.5  EOSABS 0.0  --  0.0 0.0 0.0  BASOSABS 0.1  --  0.0 0.0 0.0     Recent Labs  Lab 11/17/20 1210 11/17/20 1219 11/17/20 1224 11/17/20 1416 11/18/20 0141 11/18/20 0840 11/19/20 0048 11/20/20 0132 11/21/20 0117  NA 136  --  138  --  135  --  138 137 136  K 4.2  --  4.2  --  3.7  --  3.5 3.7 3.6  CL 102  --  103  --  102  --  102 102 101  CO2 22  --   --   --  27  --  30 29 26   GLUCOSE 197*  --  198*  --  126*  --  101* 98 91  BUN 12  --  15   --  5*  --  6 8 10   CREATININE 0.73  --  0.60  --  0.57  --  0.63 0.63 0.62  CALCIUM 9.1  --   --   --  8.8*  --  8.8* 8.8* 9.0  AST 31  --   --   --   --   --  19 17 18   ALT 21  --   --   --   --   --  16 18 17   ALKPHOS 81  --   --   --   --   --  61 61 65  BILITOT 0.3  --   --   --   --   --  0.6 0.5 0.7  ALBUMIN 4.1  --   --   --   --   --  3.5 3.4* 3.6  MG  --   --   --   --   --   --  2.1 2.0 2.0  LATICACIDVEN  --  4.5*  --  1.3  --   --   --   --   --   INR 0.9  --   --   --   --  1.0  --   --   --   HGBA1C 5.8*  --   --   --   --   --   --   --   --   BNP  --   --   --   --   --   --  88.5 31.5 14.0     ------------------------------------------------------------------------------------------------------------------ No results for input(s): CHOL, HDL, LDLCALC, TRIG, CHOLHDL, LDLDIRECT in the last 72 hours.  Lab Results  Component Value Date   HGBA1C 5.8 (H) 11/17/2020   ------------------------------------------------------------------------------------------------------------------ No results for input(s): TSH, T4TOTAL, T3FREE, THYROIDAB in the last 72 hours.  Invalid input(s): FREET3  Cardiac Enzymes No results for input(s): CKMB, TROPONINI, MYOGLOBIN in the last 168 hours.  Invalid input(s): CK ------------------------------------------------------------------------------------------------------------------    Component Value Date/Time   BNP 14.0 11/21/2020 0117    Micro Results Recent Results (from the past 240 hour(s))  Resp Panel by RT-PCR (Flu A&B, Covid) Nasopharyngeal Swab     Status: None   Collection Time: 11/17/20 12:15 PM   Specimen: Nasopharyngeal Swab; Nasopharyngeal(NP) swabs in vial transport medium  Result Value Ref Range Status   SARS Coronavirus 2 by RT PCR NEGATIVE NEGATIVE Final    Comment: (NOTE) SARS-CoV-2 target nucleic acids are NOT DETECTED.  The SARS-CoV-2 RNA is generally detectable in upper respiratory  specimens during the acute  phase of infection. The lowest concentration of SARS-CoV-2 viral copies this assay can detect is 138 copies/mL. A negative result does not preclude SARS-Cov-2 infection and should not be used as the sole basis for treatment or other patient management decisions. A negative result may occur with  improper specimen collection/handling, submission of specimen other than nasopharyngeal swab, presence of viral mutation(s) within the areas targeted by this assay, and inadequate number of viral copies(<138 copies/mL). A negative result must be combined with clinical observations, patient history, and epidemiological information. The expected result is Negative.  Fact Sheet for Patients:  EntrepreneurPulse.com.au  Fact Sheet for Healthcare Providers:  IncredibleEmployment.be  This test is no t yet approved or cleared by the [place] FDA and  has been authorized for detection and/or diagnosis of SARS-CoV-2 by FDA under an Emergency Use Authorization (EUA). This EUA will remain  in effect (meaning this test can be used) for the duration of the COVID-19 declaration under Section 564(b)(1) of the Act, 21 U.S.C.section 360bbb-3(b)(1), unless the authorization is terminated  or revoked sooner.       Influenza A by PCR NEGATIVE NEGATIVE Final   Influenza B by PCR NEGATIVE NEGATIVE Final    Comment: (NOTE) The Xpert Xpress SARS-CoV-2/FLU/RSV plus assay is intended as an aid in the diagnosis of influenza from Nasopharyngeal swab specimens and should not be used as a sole basis for treatment. Nasal washings and aspirates are unacceptable for Xpert Xpress SARS-CoV-2/FLU/RSV testing.  Fact Sheet for Patients: EntrepreneurPulse.com.au  Fact Sheet for Healthcare Providers: IncredibleEmployment.be  This test is not yet approved or cleared by the [place] FDA and has been authorized for detection and/or diagnosis of  SARS-CoV-2 by FDA under an Emergency Use Authorization (EUA). This EUA will remain in effect (meaning this test can be used) for the duration of the COVID-19 declaration under Section 564(b)(1) of the Act, 21 U.S.C. section 360bbb-3(b)(1), unless the authorization is terminated or revoked.  Performed at [facility], [place] [address], [name], [place] [address]   CSF culture w Gram Stain     Status: None (Preliminary result)   Collection Time: 11/19/20 11:50 AM   Specimen: PATH Cytology CSF; Cerebrospinal Fluid  Result Value Ref Range Status   Specimen Description CSF  Final   Special Requests NONE  Final   Gram Stain   Final    WBC PRESENT, PREDOMINANTLY MONONUCLEAR NO ORGANISMS SEEN CYTOSPIN SMEAR    Culture   Final    NO GROWTH 2 DAYS Performed at [facility], [place] [address], [place], [place] [address]    Report Status PENDING  Incomplete  Anaerobic culture w Gram Stain     Status: None (Preliminary result)   Collection Time: 11/19/20 11:50 AM   Specimen: PATH Cytology CSF; Cerebrospinal Fluid  Result Value Ref Range Status   Specimen Description CSF  Final   Special Requests NONE  Final   Gram Stain   Final    WBC PRESENT, PREDOMINANTLY MONONUCLEAR NO ORGANISMS SEEN CYTOSPIN SMEAR Performed at [facility], [place] [address], [place],  [address]    Culture PENDING  Incomplete   Report Status PENDING  Incomplete    Radiology Reports CT HEAD WO CONTRAST  Result Date: 11/17/2020 CLINICAL DATA:  Altered mental status following a syncopal episode. Seizure-like activity. Possible stroke. History of right-sided parotid carcinoma. EXAM: CT HEAD WITHOUT CONTRAST TECHNIQUE: Contiguous axial images were obtained from the base of the skull  through the vertex without intravenous contrast. COMPARISON:  Head MRI 06/25/2019 FINDINGS: Brain: There is a new moderate-sized region of vasogenic edema involving right temporal lobe white matter. There are a few small  foci of calcification along the inferior aspect of the right temporal lobe which may be associated with some abnormal soft tissue in this region, however a discrete mass is not well demonstrated on this unenhanced study. There is mild local mass effect without midline shift. No acute cortically based infarct, intracranial hemorrhage, or extra-axial fluid collection is identified. The ventricles are normal in size. Vascular: Calcified atherosclerosis at the skull base. No hyperdense vessel. Skull: No fracture or suspicious osseous lesion. Sinuses/Orbits: Partially visualized mucous retention cysts in the right maxillary sinus. New moderate-sized right mastoid effusion. Right-sided eyelid weight. Other: Partially visualized sequelae of right parotidectomy. IMPRESSION: 1. New moderate-sized region of vasogenic edema in the right temporal lobe, suspicious for metastatic disease with this history. Brain MRI without and with contrast is recommended for further evaluation. 2. New right mastoid effusion. These results were called by telephone at the time of interpretation on 11/17/2020 at 12:50 pm to Dr. Sherwood Gambler, who verbally acknowledged these results. Electronically Signed   By: Logan Bores M.D.   On: 11/17/2020 12:51   MR Brain W and Wo Contrast  Result Date: 11/17/2020 CLINICAL DATA:  Provided history: Brain mass or lesion. EXAM: MRI HEAD WITHOUT AND WITH CONTRAST TECHNIQUE: Multiplanar, multiecho pulse sequences of the brain and surrounding structures were obtained without and with intravenous contrast. CONTRAST:  25mL GADAVIST GADOBUTROL 1 MMOL/ML IV SOLN COMPARISON:  Prior head CT examinations 11/17/2020 and earlier. Brain MRI 06/25/2019. FINDINGS: Brain: Mild intermittent motion degradation. New from the prior brain MRI of 06/25/2019, there is a 2.4 x 1.4 x 0.8 cm peripherally enhancing focus within the inferior right temporal lobe (for instance as seen on series 11, image 18) (series 9, image 15). Also  new from this prior examination, there is a moderate-sized focus of vasogenic edema within the right temporal lobe extending to the right temporal stem. Diffusion-weighted signal abnormality is present within the cortex of the anterior right temporal lobe, within the right hippocampus and within the right temporal stem. Persistent asymmetric enhancement along the mastoid segment of the right facial nerve (for instance as seen on series 11, image 12). New from the prior MRI, there is asymmetric enhancement along the inferomedial aspect of the right middle cranial fossa (series 11, image 15). Mild multifocal T2/FLAIR hyperintensity elsewhere within the cerebral white matter and within the pons, nonspecific but compatible with chronic small vessel ischemic disease. No chronic intracranial blood products. No extra-axial fluid collection. No midline shift. Vascular: Expected proximal arterial flow voids. Skull and upper cervical spine: No focal marrow lesion. Sinuses/Orbits: Visualized orbits show no acute finding. 2.8 cm right maxillary sinus mucous retention cyst. Other: Large right mastoid effusion. Sequela of interval right parotid resection. These results were called by telephone at the time of interpretation on 11/17/2020 at 5:47 pm to provider Dr.Zhang, who verbally acknowledged these results. IMPRESSION: 2.4 x 1.4 x 0.8 cm peripherally enhancing focus within the inferior right temporal lobe, new as compared to the brain MRI of 06/25/2019. Also new from this prior exam, there is moderate vasogenic edema within the right temporal lobe, extending to the right temporal stem. These findings may reflect necrosis and edema related to prior radiation therapy. However, a right temporal lobe metastasis cannot be excluded. Neuro-Oncology consultation is recommended. Diffusion-weighted signal abnormality within the cortex  of the anterior right temporal lobe, within the right hippocampus and within the medial right thalamus.  Findings are nonspecific. Primary considerations are encephalitis (including herpes encephalitis), seizure-related changes or acute ischemia. Sequela of interval right parotid resection. Persistent asymmetric enhancement of the mastoid segment of the right facial nerve. Residual perineural tumor at this site cannot be excluded. New from the prior brain MRI, there is subtle asymmetric enhancement along the inferomedial aspect of the right middle cranial fossa, which could reflect additional perineural tumor spread (along the greater superficial petrosal nerve). Mild chronic small vessel ischemic changes within the cerebral white matter and pons. Large right mastoid effusion. Large right maxillary sinus mucous retention cyst. Electronically Signed   By: Kellie Simmering DO   On: 11/17/2020 17:57   MR SPECTROSCOPY  Result Date: 11/19/2020 CLINICAL DATA:  61 year old female with a history of parotid carcinoma with imaging evidence of tumor extension along the right 7th nerve to the skull base. Parotid resection and radiation. Presenting with seizure and found to have new enhancement and vasogenic edema in the right temporal lobe. MRI spectroscopy requested. EXAM: MRI HEAD WITHOUT CONTRAST TECHNIQUE: Multiplanar, multiecho pulse sequences of the brain and surrounding structures were obtained without intravenous contrast. COMPARISON:  Post treatment brain MRI 11/17/2020. Pretreatment brain MRI 06/25/2019. FINDINGS: Multi voxel type MR spectroscopy was performed at 3 Tesla in regions of interest in the: -abnormal right temporal lobe (inferior temporal dark T2 region corresponding to enhancement, and more posterior temporal lobe region of edema) And -contralateral normal left temporal lobe. Contralateral left temporal lobe spectroscopy demonstrates expected normal peaks of creatine, choline, and N-acetyl aspartate (such as on series 13). In the dark T2 inferior temporal region corresponding to the enhancing portion of the  lesion on recent MRI the tracing is abnormal, with consistently elevated peaks to the right of NAA likely corresponding to combination of lipids and lactate. But varying levels of Cr and Cho ranging from relatively normal (series 15) to abnormally elevated (series 10). In the more posterior temporal lobe region which most resembles edema on anatomic MRI the tracing resembles the more normal contralateral left lobe (Mildly elevated CR and Cho with fairly normal NAA but also elevated lipid/lactate (series 1450). IMPRESSION: 1. Abnormal spectroscopy at the inferior right temporal lobe, area of dark T2 signal corresponding to enhancement on the recent anatomic MRI. Increased lipid/lactate there which can be a marker for cell death/necrosis, but can also be seen in brain metastases. Also questionable elevation of Cr/Cho is present, which can be seen with tumor hypercellularity. 2. The posterior right temporal lobe area most resembling edema on the recent anatomic MRI has a relatively normal spectroscopy. 3. Spectroscopy favors radiation necrosis over a right temporal lobe metastasis, but additional advanced imaging correlation is recommended. Given the proximity of the temporal lobe enhancement to the skull base, Brain PET would likely be more successful than MR Perfusion imaging. Electronically Signed   By: Genevie Ann M.D.   On: 11/19/2020 12:03   CT CHEST ABDOMEN PELVIS W CONTRAST  Result Date: 11/18/2020 CLINICAL DATA:  Staging for possible brain neoplasm. EXAM: CT CHEST, ABDOMEN, AND PELVIS WITH CONTRAST TECHNIQUE: Multidetector CT imaging of the chest, abdomen and pelvis was performed following the standard protocol during bolus administration of intravenous contrast. CONTRAST:  137mL OMNIPAQUE IOHEXOL 300 MG/ML  SOLN COMPARISON:  March 21, 2020.  August 15, 2004. FINDINGS: CT CHEST FINDINGS Cardiovascular: Atherosclerosis of thoracic aorta is noted without aneurysm or dissection. Normal cardiac size. No  pericardial  effusion. Mediastinum/Nodes: 3.4 cm right thyroid mass is noted which is enlarged compared to prior exam. Esophagus is unremarkable. No adenopathy is noted. Lungs/Pleura: No pneumothorax or pleural effusion is noted. Mild bilateral posterior basilar subsegmental atelectasis is noted. Musculoskeletal: No chest wall mass or suspicious bone lesions identified. CT ABDOMEN PELVIS FINDINGS Hepatobiliary: No focal liver abnormality is seen. Status post cholecystectomy. No biliary dilatation. Pancreas: Unremarkable. No pancreatic ductal dilatation or surrounding inflammatory changes. Spleen: Normal in size without focal abnormality. Adrenals/Urinary Tract: Adrenal glands are unremarkable. Kidneys are normal, without renal calculi, focal lesion, or hydronephrosis. Bladder is unremarkable. Stomach/Bowel: Stomach is within normal limits. Appendix appears normal. No evidence of bowel wall thickening, distention, or inflammatory changes. Vascular/Lymphatic: Aortic atherosclerosis. No enlarged abdominal or pelvic lymph nodes. Reproductive: Status post hysterectomy. No adnexal masses. Other: No abdominal wall hernia or abnormality. No abdominopelvic ascites. Musculoskeletal: No acute or significant osseous findings. IMPRESSION: 3.4 cm right thyroid nodule or mass is noted which is enlarged compared to prior exam. Recommend thyroid US. (Ref: J Am Coll Radiol. 2015 Feb;12(2): 143-50). No other significant abnormality seen in the chest, abdomen or pelvis. Aortic Atherosclerosis (ICD10-I70.0). Electronically Signed   By: Marijo Conception M.D.   On: 11/18/2020 14:56   EEG adult  Result Date: 11/17/2020 Lora Havens, MD     11/17/2020  8:59 PM Patient Name: Saydie Gerdts MRN: [id number] Epilepsy Attending: Lora Havens Referring Physician/Provider: Dr Amie Portland Date: 11/17/2020 Duration: 21.32 mins Patient history: 60 year old with past medical history of a right parotid salivary gland ductal carcinoma status  post surgery and postoperative right facial nerve palsy, presenting after an episode of seizure-like activity. EEG to evaluate for seizure Level of alertness: Awake AEDs during EEG study: LEV Technical aspects: This EEG study was done with scalp electrodes positioned according to the 10-20 International system of electrode placement. Electrical activity was acquired at a sampling rate of 500Hz  and reviewed with a high frequency filter of 70Hz  and a low frequency filter of 1Hz . EEG data were recorded continuously and digitally stored. Description: No posterior dominant rhythm was seen. EEG showed continuous generalized and lateralized polymorphic sharply contoured 5 to 9 Hz theta-alpha activity. Continuous 2-3hz  delta slowing was also noted in right temporo-parietal region. Lateralized periodic discharges were noted in right hemisphere, maximal ight temporo-parietal region, at 1hz  with superimposed rhythmicity at times.  Hyperventilation and photic stimulation were not performed.   ABNORMALITY - Lateralized periodic discharges with overriding rhythmic activity ( LPD +) right, maximal temporo-parietal region - Continuous slow, generalized and maximal right temporo-parietal region. IMPRESSION: This study howed evidence of epileptogenicity as well as cortical dysfunction arising from right temporo-parietal region likely secondary to underlying structural abnormality/ mass. The lateralized periodic discharges with overriding rhythmic activity are on the ictal-interictal continuum with high potential for seizures, There is also moderate diffuse encephalopathy, nonspecific etiology. No seizures were seen throughout the recording. Dr Rory Percy was notified. Priyanka [name]   Overnight EEG with video  Result Date: 11/18/2020 Lora Havens, MD     11/19/2020  8:34 AM Patient Name: Danyelle Brookover MRN: [id number] Epilepsy Attending: Lora Havens Referring Physician/Provider: Dr Amie Portland Duration: 11/17/2020 1825 to  11/18/2020 1142, 1949  to 11/19/2020 0300  Patient history: 60 year old with past medical history of a 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 EEG to evaluate for seizure  Level of alertness: Awake, asleep  AEDs during  EEG study: LEV, PHT  Technical aspects: This EEG study was done with scalp electrodes positioned according to the 10-20 International system of electrode placement. Electrical activity was acquired at a sampling rate of 500Hz  and reviewed with a high frequency filter of 70Hz  and a low frequency filter of 1Hz . EEG data were recorded continuously and digitally stored.  Description: No posterior dominant rhythm was seen. EEG showed continuous generalized and lateralized polymorphic sharply contoured 5 to 9 Hz theta-alpha activity. Continuous 2-3hz  delta slowing was also noted in right temporo-parietal region. Lateralized periodic discharges were noted in right hemisphere, maximal ight temporo-parietal region, at 0.5-1hz  with superimposed rhythmicity at times.  Seizures without clinical signs were also noted arising from right temporoparietal region, average 2-3 seizures per hour, lasting about 15 to 45 seconds each.  EEG was interrupted between 11/18/2020 1142 to 1949 for procedures.  After 1949, EEG continued to show lateralized periodic discharges in right hemisphere, maximal right temporoparietal region.  Brief ictal-interictal rhythmic discharges were also noted in right temporoparietal region.  ABNORMALITY -Seizure without clinical signs, right temporoparietal region -Brief ictal-interictal rhythmic discharges, right temporoparietal region (BIRDs) - Lateralized periodic discharges with overriding rhythmic activity ( LPD +) right, maximal temporo-parietal region - Continuous slow, generalized and maximal right temporo-parietal region.  IMPRESSION: This study showed seizures without  clinical signs arising from right temporoparietal region, average 2- 3/hour, lasting about 15 to 45 seconds each.  After 1949 on 11/18/2020, EEG showed brief ictal-interictal rhythmic discharges arising from right temporoparietal region in addition to lateralized periodic discharges with overriding rhythmic activity which are on the ictal-interictal continuum with high potential for seizures.  There is also evidence of cortical dysfunction arising from right temporo-parietal region likely secondary to underlying structural abnormality/ mass.  Additionally, there is moderate diffuse encephalopathy, nonspecific etiology.  Priyanka [name]   DG FL GUIDED LUMBAR PUNCTURE  Result Date: 11/19/2020 CLINICAL DATA:  60 year old female with mental status changes, for lumbar puncture. EXAM: DIAGNOSTIC LUMBAR PUNCTURE UNDER FLUOROSCOPIC GUIDANCE COMPARISON:  Prior exams FLUOROSCOPY TIME:  Fluoroscopy Time:  48 seconds Number of Acquired Spot Images: 2 PROCEDURE: Informed consent was obtained from the patient prior to the procedure, including potential complications of headache, allergy, and pain. With the patient prone, the lower back was prepped with Betadine. 1% Lidocaine was used for local anesthesia. Lumbar puncture was performed at the L4-5 level using a 20 gauge needle with return of clear colorless CSF with an opening pressure of 13 cm water. Sixteen ml of CSF were obtained for laboratory studies. The patient tolerated the procedure well and there were no apparent complications. IMPRESSION: Fluoroscopic guided lumbar puncture.  No immediate complication. Electronically Signed   By: Margarette Canada M.D.   On: 11/19/2020 11:54   CT [facility] HEAD W/O CONTRAST  Result Date: 11/18/2020 CLINICAL DATA:  Brain mass. Surgical planning. Right parotid malignancy EXAM: CT HEAD WITHOUT CONTRAST TECHNIQUE: Contiguous axial images were obtained from the base of the skull through the vertex without intravenous contrast. COMPARISON:  CT  and MRI head 11/17/2020 FINDINGS: Brain: Mass lesion in the right temporal lobe contains small calcifications and white matter edema throughout the right temporal lobe. No change from yesterday. No associated hemorrhage. Ventricle size normal. No midline shift. No acute infarct. No other mass lesion identified. There is streak artifact from metallic implant in the right eyelid. Vascular: Negative for hyperdense vessel Skull: Negative Sinuses/Orbits: Retention cyst right maxillary sinus. Remaining paranasal sinuses clear. Right mastoid effusion. Left mastoid clear. Metallic implant  in the right eyelid. Other: Right parotidectomy. IMPRESSION: Mass lesion right temporal lobe unchanged. Mass contains small areas of calcification diffuse white matter edema. No change from yesterday. Electronically Signed   By: Franchot Gallo M.D.   On: 11/18/2020 12:59

## 2020-11-22 LAB — CBC WITH DIFFERENTIAL/PLATELET
Abs Immature Granulocytes: 0.06 10*3/uL (ref 0.00–0.07)
Basophils Absolute: 0 10*3/uL (ref 0.0–0.1)
Basophils Relative: 0 %
Eosinophils Absolute: 0 10*3/uL (ref 0.0–0.5)
Eosinophils Relative: 0 %
HCT: 44.7 % (ref 36.0–46.0)
Hemoglobin: 14.9 g/dL (ref 12.0–15.0)
Immature Granulocytes: 1 %
Lymphocytes Relative: 16 %
Lymphs Abs: 1 10*3/uL (ref 0.7–4.0)
MCH: 30.1 pg (ref 26.0–34.0)
MCHC: 33.3 g/dL (ref 30.0–36.0)
MCV: 90.3 fL (ref 80.0–100.0)
Monocytes Absolute: 0.4 10*3/uL (ref 0.1–1.0)
Monocytes Relative: 7 %
Neutro Abs: 4.8 10*3/uL (ref 1.7–7.7)
Neutrophils Relative %: 76 %
Platelets: 243 10*3/uL (ref 150–400)
RBC: 4.95 MIL/uL (ref 3.87–5.11)
RDW: 13.1 % (ref 11.5–15.5)
WBC: 6.3 10*3/uL (ref 4.0–10.5)
nRBC: 0 % (ref 0.0–0.2)

## 2020-11-22 LAB — COMPREHENSIVE METABOLIC PANEL WITH GFR
ALT: 33 U/L (ref 0–44)
AST: 27 U/L (ref 15–41)
Albumin: 3.9 g/dL (ref 3.5–5.0)
Alkaline Phosphatase: 73 U/L (ref 38–126)
Anion gap: 7 (ref 5–15)
BUN: 10 mg/dL (ref 6–20)
CO2: 31 mmol/L (ref 22–32)
Calcium: 9.6 mg/dL (ref 8.9–10.3)
Chloride: 99 mmol/L (ref 98–111)
Creatinine, Ser: 0.73 mg/dL (ref 0.44–1.00)
GFR, Estimated: >60 mL/min (ref >60)
Glucose, Bld: 117 mg/dL — ABNORMAL HIGH (ref 70–99)
Potassium: 4.3 mmol/L (ref 3.5–5.1)
Sodium: 137 mmol/L (ref 135–145)
Total Bilirubin: 0.4 mg/dL (ref 0.3–1.2)
Total Protein: 7.2 g/dL (ref 6.5–8.1)

## 2020-11-22 LAB — CSF CULTURE W GRAM STAIN: Culture: NO GROWTH

## 2020-11-22 LAB — MAGNESIUM: Magnesium: 2.2 mg/dL (ref 1.7–2.4)

## 2020-11-22 LAB — BRAIN NATRIURETIC PEPTIDE: B Natriuretic Peptide: 7.2 pg/mL (ref 0.0–100.0)

## 2020-11-22 LAB — HSV 1/2 PCR, CSF
HSV-1 DNA: NEGATIVE
HSV-2 DNA: NEGATIVE

## 2020-11-22 NOTE — Progress Notes (Signed)
PROGRESS NOTE                                                                                                                                                                                                             Patient Demographics:    Melissa Case, is a 60 y.o. female, DOB - 06-05-1960, QMG:[id number]  Outpatient Primary MD for the patient is Patient, No Pcp Per (Inactive)    LOS - 5  Admit date - 11/17/2020    Chief Complaint  Patient presents with   Altered Mental Status       Brief Narrative 60 year old with past history of  high-grade carcinoma of right parotid gland-s/p right parotidectomy on 07/22/2019-and postoperative radiation-presenting with seizure-like activity.    Significant studies: 6/29>> CT head: Moderate size vasogenic edema in the right temporal lobe 6/29>> MRI brain: 2.4 x 1.4 x 0.8 peripherally enhancing focus in the right temporal lobe with vasogenic edema. 6/29>> spot EEG :epileptogenicity arising from right temporo-parietal region 6/29-6/30>> LTM EEG: Seizure arising from right temporoparietal region 7/1>> CSF oligoclonal bands: Pending  Procedures: 7/1>> LP  Culture data: CSF culture: No growth CSF HSV PCR: Pending CSF VDRL: Pending CSF fungal culture: Pending  Cytology: CSF cytology: Pending    Subjective:   Lying comfortably in bed-no major issues overnight.  Spouse at bedside.   Assessment  & Plan :    Seizures: No further seizures-stable on Keppra/phenytoin-no further recommendations from neurology.      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  .  On Decadron for vasogenic edema.  Neurosurgery following-contemplating biopsy later this week.  History of high-grade carcinoma of right parotid  gland-right parotidectomy (March 2021) followed by postoperative radiation (4/26 2021 through 10/27/18/2021)-complicated by right facial paralysis.  GERD: Continue PPI  Anxiety/depression: Stable-on Paxil      Condition - Extremely Guarded  Family Communication  : Spouse at bedside on 7/4  Code Status :  Full  Consults  :  Neuro, N.Surg, N.Onco, IR  PUD Prophylaxis : PPI       Disposition Plan  :    Status is: Inpatient  Remains inpatient appropriate because:IV treatments appropriate due to intensity of illness or inability to take PO  Dispo: The patient is  from: Home              Anticipated d/c is to: SNF              Patient currently is not medically stable to d/c.   Difficult to place patient No  DVT Prophylaxis  :  SCDs-as possible craniotomy/brain biopsy on Monday  Place and maintain sequential compression device Start: 11/18/20 1226  Lab Results  Component Value Date   PLT 243 11/22/2020    Diet :  Diet Order             DIET DYS 3 Room service appropriate? Yes; Fluid consistency: Thin  Diet effective now                    Inpatient Medications  Scheduled Meds:  dexamethasone (DECADRON) injection  4 mg Intravenous Q6H   nicotine  21 mg Transdermal Daily   pantoprazole  40 mg Oral Daily   PARoxetine  40 mg Oral Daily   phenytoin (DILANTIN) IV  100 mg Intravenous Q8H   Continuous Infusions:  acyclovir 680 mg (11/22/20 1224)   levETIRAcetam 1,000 mg (11/22/20 1148)   PRN Meds:.acetaminophen, docusate sodium, LORazepam, [DISCONTINUED] ondansetron **OR** ondansetron (ZOFRAN) IV, polyvinyl alcohol, traZODone  Antibiotics  :    Anti-infectives (From admission, onward)    Start     Dose/Rate Route Frequency Ordered Stop   11/17/20 1930  acyclovir (ZOVIRAX) 680 mg in dextrose 5 % 100 mL IVPB        10 mg/kg  68 kg 113.6 mL/hr over 60 Minutes Intravenous Every 8 hours 11/17/20 1857          Time Spent in minutes  25   Oren Binet  M.D on 11/22/2020 at 1:45 PM  To page go to [email or web address]   [facility] -  Office  [phone]    See all Orders from today for further details    Objective:   Vitals:   11/22/20 0000 11/22/20 0350 11/22/20 0753 11/22/20 1236  BP: 105/71 104/65 106/62 118/65  Pulse: 82 76 86 91  Resp: 17 18 16 16   Temp:  98.7 F (37.1 C) 98.5 F (36.9 C) 98.7 F (37.1 C)  TempSrc:   Oral Oral  SpO2: 94% 96% 97% 95%  Weight:      Height:        Wt Readings from Last 3 Encounters:  11/21/20 72 kg  10/26/20 68 kg  05/28/20 68.5 kg     Intake/Output Summary (Last 24 hours) at 11/22/2020 1345 Last data filed at 11/21/2020 1804 Gross per 24 hour  Intake 583.75 ml  Output --  Net 583.75 ml      Physical Exam Gen Exam:Alert awake-not in any distress HEENT:atraumatic, normocephalic Chest: B/L clear to auscultation anteriorly CVS:S1S2 regular Abdomen:soft non tender, non distended Extremities:no edema Neurology: Chronic right facial droop-otherwise nonfocal. Skin: no rash      Data Review:    CBC Recent Labs  Lab 11/17/20 1210 11/17/20 1224 11/19/20 0048 11/20/20 0132 11/21/20 0117 11/22/20 0101  WBC 6.6  --  10.1 6.7 6.5 6.3  HGB 14.2 15.3* 12.3 12.7 13.7 14.9  HCT 44.4 45.0 38.0 37.8 40.8 44.7  PLT 313  --  255 234 208 243  MCV 94.5  --  92.2 89.6 89.5 90.3  MCH 30.2  --  29.9 30.1 30.0 30.1  MCHC 32.0  --  32.4 33.6 33.6 33.3  RDW 13.2  --  13.2 13.0 13.0  13.1  LYMPHSABS 0.6*  --  1.0 1.7 1.6 1.0  MONOABS 0.3  --  0.6 0.6 0.5 0.4  EOSABS 0.0  --  0.0 0.0 0.0 0.0  BASOSABS 0.1  --  0.0 0.0 0.0 0.0     Recent Labs  Lab 11/17/20 1210 11/17/20 1219 11/17/20 1224 11/17/20 1416 11/18/20 0141 11/18/20 0840 11/19/20 0048 11/20/20 0132 11/21/20 0117 11/22/20 0101  NA 136  --    < >  --  135  --  138 137 136 137  K 4.2  --    < >  --  3.7  --  3.5 3.7 3.6 4.3  CL 102  --    < >  --  102  --  102 102 101 99  CO2 22  --   --   --  27  --  30 29 26 31    GLUCOSE 197*  --    < >  --  126*  --  101* 98 91 117*  BUN 12  --    < >  --  5*  --  6 8 10 10   CREATININE 0.73  --    < >  --  0.57  --  0.63 0.63 0.62 0.73  CALCIUM 9.1  --   --   --  8.8*  --  8.8* 8.8* 9.0 9.6  AST 31  --   --   --   --   --  19 17 18 27   ALT 21  --   --   --   --   --  16 18 17  33  ALKPHOS 81  --   --   --   --   --  61 61 65 73  BILITOT 0.3  --   --   --   --   --  0.6 0.5 0.7 0.4  ALBUMIN 4.1  --   --   --   --   --  3.5 3.4* 3.6 3.9  MG  --   --   --   --   --   --  2.1 2.0 2.0 2.2  LATICACIDVEN  --  4.5*  --  1.3  --   --   --   --   --   --   INR 0.9  --   --   --   --  1.0  --   --   --   --   HGBA1C 5.8*  --   --   --   --   --   --   --   --   --   BNP  --   --   --   --   --   --  88.5 31.5 14.0 7.2   < > = values in this interval not displayed.     ------------------------------------------------------------------------------------------------------------------ No results for input(s): CHOL, HDL, LDLCALC, TRIG, CHOLHDL, LDLDIRECT in the last 72 hours.  Lab Results  Component Value Date   HGBA1C 5.8 (H) 11/17/2020   ------------------------------------------------------------------------------------------------------------------ No results for input(s): TSH, T4TOTAL, T3FREE, THYROIDAB in the last 72 hours.  Invalid input(s): FREET3  Cardiac Enzymes No results for input(s): CKMB, TROPONINI, MYOGLOBIN in the last 168 hours.  Invalid input(s): CK ------------------------------------------------------------------------------------------------------------------    Component Value Date/Time   BNP 7.2 11/22/2020 0101    Micro Results Recent Results (from the past 240 hour(s))  Resp Panel by RT-PCR (Flu A&B, Covid) Nasopharyngeal Swab  Status: None   Collection Time: 11/17/20 12:15 PM   Specimen: Nasopharyngeal Swab; Nasopharyngeal(NP) swabs in vial transport medium  Result Value Ref Range Status   SARS Coronavirus 2 by RT PCR NEGATIVE  NEGATIVE Final    Comment: (NOTE) SARS-CoV-2 target nucleic acids are NOT DETECTED.  The SARS-CoV-2 RNA is generally detectable in upper respiratory specimens during the acute phase of infection. The lowest concentration of SARS-CoV-2 viral copies this assay can detect is 138 copies/mL. A negative result does not preclude SARS-Cov-2 infection and should not be used as the sole basis for treatment or other patient management decisions. A negative result may occur with  improper specimen collection/handling, submission of specimen other than nasopharyngeal swab, presence of viral mutation(s) within the areas targeted by this assay, and inadequate number of viral copies(<138 copies/mL). A negative result must be combined with clinical observations, patient history, and epidemiological information. The expected result is Negative.  Fact Sheet for Patients:  EntrepreneurPulse.com.au  Fact Sheet for Healthcare Providers:  IncredibleEmployment.be  This test is no t yet approved or cleared by the [place] FDA and  has been authorized for detection and/or diagnosis of SARS-CoV-2 by FDA under an Emergency Use Authorization (EUA). This EUA will remain  in effect (meaning this test can be used) for the duration of the COVID-19 declaration under Section 564(b)(1) of the Act, 21 U.S.C.section 360bbb-3(b)(1), unless the authorization is terminated  or revoked sooner.       Influenza A by PCR NEGATIVE NEGATIVE Final   Influenza B by PCR NEGATIVE NEGATIVE Final    Comment: (NOTE) The Xpert Xpress SARS-CoV-2/FLU/RSV plus assay is intended as an aid in the diagnosis of influenza from Nasopharyngeal swab specimens and should not be used as a sole basis for treatment. Nasal washings and aspirates are unacceptable for Xpert Xpress SARS-CoV-2/FLU/RSV testing.  Fact Sheet for Patients: EntrepreneurPulse.com.au  Fact Sheet for Healthcare  Providers: IncredibleEmployment.be  This test is not yet approved or cleared by the [place] FDA and has been authorized for detection and/or diagnosis of SARS-CoV-2 by FDA under an Emergency Use Authorization (EUA). This EUA will remain in effect (meaning this test can be used) for the duration of the COVID-19 declaration under Section 564(b)(1) of the Act, 21 U.S.C. section 360bbb-3(b)(1), unless the authorization is terminated or revoked.  Performed at [facility], [place] [address] [place], [place] [address]   CSF culture w Gram Stain     Status: None   Collection Time: 11/19/20 11:50 AM   Specimen: PATH Cytology CSF; Cerebrospinal Fluid  Result Value Ref Range Status   Specimen Description CSF  Final   Special Requests NONE  Final   Gram Stain   Final    WBC PRESENT, PREDOMINANTLY MONONUCLEAR NO ORGANISMS SEEN CYTOSPIN SMEAR    Culture   Final    NO GROWTH 3 DAYS Performed at [facility], [place] [address], [place], [place] [address]    Report Status 11/22/2020 FINAL  Final  Fungus Culture With Stain     Status: None (Preliminary result)   Collection Time: 11/19/20 11:50 AM   Specimen: PATH Cytology CSF; Cerebrospinal Fluid  Result Value Ref Range Status   Fungus Stain Final report  Final    Comment: (NOTE) Performed At: [facility] [place], [place] [id number] Rush Farmer MD QQ:[id number]    Fungus (Mycology) Culture PENDING  Incomplete   Fungal Source CSF  Final    Comment: Performed at [facility], [place] [address] [place], [place] [address]  Anaerobic culture w Gram Stain     Status: None (Preliminary result)   Collection Time: 11/19/20 11:50 AM   Specimen: PATH Cytology CSF; Cerebrospinal Fluid  Result Value Ref Range Status   Specimen Description CSF  Final   Special Requests NONE  Final   Gram Stain   Final    WBC PRESENT, PREDOMINANTLY MONONUCLEAR NO ORGANISMS SEEN CYTOSPIN  SMEAR Performed at [facility], [place] [address] [place], [place] [address]    Culture   Final    NO ANAEROBES ISOLATED; CULTURE IN PROGRESS FOR 5 DAYS   Report Status PENDING  Incomplete  Fungus Culture Result     Status: None   Collection Time: 11/19/20 11:50 AM  Result Value Ref Range Status   Result 1 Comment  Final    Comment: (NOTE) KOH/Calcofluor preparation:  no fungus observed. Performed At: [facility] [place], [place] [id number] Rush Farmer MD YK:[id number]     Radiology Reports CT HEAD WO CONTRAST  Result Date: 11/17/2020 CLINICAL DATA:  Altered mental status following a syncopal episode. Seizure-like activity. Possible stroke. History of right-sided parotid carcinoma. EXAM: CT HEAD WITHOUT CONTRAST TECHNIQUE: Contiguous axial images were obtained from the base of the skull through the vertex without intravenous contrast. COMPARISON:  Head MRI 06/25/2019 FINDINGS: Brain: There is a new moderate-sized region of vasogenic edema involving right temporal lobe white matter. There are a few small foci of calcification along the inferior aspect of the right temporal lobe which may be associated with some abnormal soft tissue in this region, however a discrete mass is not well demonstrated on this unenhanced study. There is mild local mass effect without midline shift. No acute cortically based infarct, intracranial hemorrhage, or extra-axial fluid collection is identified. The ventricles are normal in size. Vascular: Calcified atherosclerosis at the skull base. No hyperdense vessel. Skull: No fracture or suspicious osseous lesion. Sinuses/Orbits: Partially visualized mucous retention cysts in the right maxillary sinus. New moderate-sized right mastoid effusion. Right-sided eyelid weight. Other: Partially visualized sequelae of right parotidectomy. IMPRESSION: 1. New moderate-sized region of vasogenic edema in the right temporal lobe, suspicious for  metastatic disease with this history. Brain MRI without and with contrast is recommended for further evaluation. 2. New right mastoid effusion. These results were called by telephone at the time of interpretation on 11/17/2020 at 12:50 pm to Dr. Sherwood Gambler, who verbally acknowledged these results. Electronically Signed   By: Logan Bores M.D.   On: 11/17/2020 12:51   MR Brain W and Wo Contrast  Result Date: 11/17/2020 CLINICAL DATA:  Provided history: Brain mass or lesion. EXAM: MRI HEAD WITHOUT AND WITH CONTRAST TECHNIQUE: Multiplanar, multiecho pulse sequences of the brain and surrounding structures were obtained without and with intravenous contrast. CONTRAST:  29mL GADAVIST GADOBUTROL 1 MMOL/ML IV SOLN COMPARISON:  Prior head CT examinations 11/17/2020 and earlier. Brain MRI 06/25/2019. FINDINGS: Brain: Mild intermittent motion degradation. New from the prior brain MRI of 06/25/2019, there is a 2.4 x 1.4 x 0.8 cm peripherally enhancing focus within the inferior right temporal lobe (for instance as seen on series 11, image 18) (series 9, image 15). Also new from this prior examination, there is a moderate-sized focus of vasogenic edema within the right temporal lobe extending to the right temporal stem. Diffusion-weighted signal abnormality is present within the cortex of the anterior right temporal lobe, within the right hippocampus and within the right temporal stem.  Persistent asymmetric enhancement along the mastoid segment of the right facial nerve (for instance as seen on series 11, image 12). New from the prior MRI, there is asymmetric enhancement along the inferomedial aspect of the right middle cranial fossa (series 11, image 15). Mild multifocal T2/FLAIR hyperintensity elsewhere within the cerebral white matter and within the pons, nonspecific but compatible with chronic small vessel ischemic disease. No chronic intracranial blood products. No extra-axial fluid collection. No midline shift.  Vascular: Expected proximal arterial flow voids. Skull and upper cervical spine: No focal marrow lesion. Sinuses/Orbits: Visualized orbits show no acute finding. 2.8 cm right maxillary sinus mucous retention cyst. Other: Large right mastoid effusion. Sequela of interval right parotid resection. These results were called by telephone at the time of interpretation on 11/17/2020 at 5:47 pm to provider Dr.Zhang, who verbally acknowledged these results. IMPRESSION: 2.4 x 1.4 x 0.8 cm peripherally enhancing focus within the inferior right temporal lobe, new as compared to the brain MRI of 06/25/2019. Also new from this prior exam, there is moderate vasogenic edema within the right temporal lobe, extending to the right temporal stem. These findings may reflect necrosis and edema related to prior radiation therapy. However, a right temporal lobe metastasis cannot be excluded. Neuro-Oncology consultation is recommended. Diffusion-weighted signal abnormality within the cortex of the anterior right temporal lobe, within the right hippocampus and within the medial right thalamus. Findings are nonspecific. Primary considerations are encephalitis (including herpes encephalitis), seizure-related changes or acute ischemia. Sequela of interval right parotid resection. Persistent asymmetric enhancement of the mastoid segment of the right facial nerve. Residual perineural tumor at this site cannot be excluded. New from the prior brain MRI, there is subtle asymmetric enhancement along the inferomedial aspect of the right middle cranial fossa, which could reflect additional perineural tumor spread (along the greater superficial petrosal nerve). Mild chronic small vessel ischemic changes within the cerebral white matter and pons. Large right mastoid effusion. Large right maxillary sinus mucous retention cyst. Electronically Signed   By: Kellie Simmering DO   On: 11/17/2020 17:57   MR SPECTROSCOPY  Result Date: 11/19/2020 CLINICAL DATA:   60 year old female with a history of parotid carcinoma with imaging evidence of tumor extension along the right 7th nerve to the skull base. Parotid resection and radiation. Presenting with seizure and found to have new enhancement and vasogenic edema in the right temporal lobe. MRI spectroscopy requested. EXAM: MRI HEAD WITHOUT CONTRAST TECHNIQUE: Multiplanar, multiecho pulse sequences of the brain and surrounding structures were obtained without intravenous contrast. COMPARISON:  Post treatment brain MRI 11/17/2020. Pretreatment brain MRI 06/25/2019. FINDINGS: Multi voxel type MR spectroscopy was performed at 3 Tesla in regions of interest in the: -abnormal right temporal lobe (inferior temporal dark T2 region corresponding to enhancement, and more posterior temporal lobe region of edema) And -contralateral normal left temporal lobe. Contralateral left temporal lobe spectroscopy demonstrates expected normal peaks of creatine, choline, and N-acetyl aspartate (such as on series 13). In the dark T2 inferior temporal region corresponding to the enhancing portion of the lesion on recent MRI the tracing is abnormal, with consistently elevated peaks to the right of NAA likely corresponding to combination of lipids and lactate. But varying levels of Cr and Cho ranging from relatively normal (series 15) to abnormally elevated (series 10). In the more posterior temporal lobe region which most resembles edema on anatomic MRI the tracing resembles the more normal contralateral left lobe (Mildly elevated CR and Cho with fairly normal NAA but also elevated lipid/lactate (  series 1450). IMPRESSION: 1. Abnormal spectroscopy at the inferior right temporal lobe, area of dark T2 signal corresponding to enhancement on the recent anatomic MRI. Increased lipid/lactate there which can be a marker for cell death/necrosis, but can also be seen in brain metastases. Also questionable elevation of Cr/Cho is present, which can be seen with  tumor hypercellularity. 2. The posterior right temporal lobe area most resembling edema on the recent anatomic MRI has a relatively normal spectroscopy. 3. Spectroscopy favors radiation necrosis over a right temporal lobe metastasis, but additional advanced imaging correlation is recommended. Given the proximity of the temporal lobe enhancement to the skull base, Brain PET would likely be more successful than MR Perfusion imaging. Electronically Signed   By: Genevie Ann M.D.   On: 11/19/2020 12:03   CT CHEST ABDOMEN PELVIS W CONTRAST  Result Date: 11/18/2020 CLINICAL DATA:  Staging for possible brain neoplasm. EXAM: CT CHEST, ABDOMEN, AND PELVIS WITH CONTRAST TECHNIQUE: Multidetector CT imaging of the chest, abdomen and pelvis was performed following the standard protocol during bolus administration of intravenous contrast. CONTRAST:  187mL OMNIPAQUE IOHEXOL 300 MG/ML  SOLN COMPARISON:  March 21, 2020.  August 15, 2004. FINDINGS: CT CHEST FINDINGS Cardiovascular: Atherosclerosis of thoracic aorta is noted without aneurysm or dissection. Normal cardiac size. No pericardial effusion. Mediastinum/Nodes: 3.4 cm right thyroid mass is noted which is enlarged compared to prior exam. Esophagus is unremarkable. No adenopathy is noted. Lungs/Pleura: No pneumothorax or pleural effusion is noted. Mild bilateral posterior basilar subsegmental atelectasis is noted. Musculoskeletal: No chest wall mass or suspicious bone lesions identified. CT ABDOMEN PELVIS FINDINGS Hepatobiliary: No focal liver abnormality is seen. Status post cholecystectomy. No biliary dilatation. Pancreas: Unremarkable. No pancreatic ductal dilatation or surrounding inflammatory changes. Spleen: Normal in size without focal abnormality. Adrenals/Urinary Tract: Adrenal glands are unremarkable. Kidneys are normal, without renal calculi, focal lesion, or hydronephrosis. Bladder is unremarkable. Stomach/Bowel: Stomach is within normal limits. Appendix appears  normal. No evidence of bowel wall thickening, distention, or inflammatory changes. Vascular/Lymphatic: Aortic atherosclerosis. No enlarged abdominal or pelvic lymph nodes. Reproductive: Status post hysterectomy. No adnexal masses. Other: No abdominal wall hernia or abnormality. No abdominopelvic ascites. Musculoskeletal: No acute or significant osseous findings. IMPRESSION: 3.4 cm right thyroid nodule or mass is noted which is enlarged compared to prior exam. Recommend thyroid US. (Ref: J Am Coll Radiol. 2015 Feb;12(2): 143-50). No other significant abnormality seen in the chest, abdomen or pelvis. Aortic Atherosclerosis (ICD10-I70.0). Electronically Signed   By: Marijo Conception M.D.   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 EEG to evaluate for seizure Level of alertness: Awake AEDs during EEG study: LEV Technical aspects: This EEG study was done with scalp electrodes positioned according to the 10-20 International system of electrode placement. Electrical activity was acquired at a sampling rate of 500Hz  and reviewed with a high frequency filter of 70Hz  and a low frequency filter of 1Hz . EEG data were recorded continuously and digitally stored. Description: No posterior dominant rhythm was seen. EEG showed continuous generalized and lateralized polymorphic sharply contoured 5 to 9 Hz theta-alpha activity. Continuous 2-3hz  delta  slowing was also noted in right temporo-parietal region. Lateralized periodic discharges were noted in right hemisphere, maximal ight  temporo-parietal region, at 1hz  with superimposed rhythmicity at times.  Hyperventilation and photic stimulation were not performed.   ABNORMALITY - Lateralized periodic discharges with overriding rhythmic activity ( LPD +) right, maximal temporo-parietal region - Continuous slow, generalized and maximal right temporo-parietal region. IMPRESSION: This study howed evidence of epileptogenicity as well as cortical dysfunction arising from right temporo-parietal region likely secondary to underlying structural abnormality/ mass. The lateralized periodic discharges with overriding rhythmic activity are on the ictal-interictal continuum with high potential for seizures, There is also moderate diffuse encephalopathy, nonspecific etiology. No seizures were seen throughout the recording. Dr Rory Percy was notified. Priyanka [name]   Overnight EEG with video  Result Date: 11/18/2020 Lora Havens, MD     11/19/2020  8:34 AM Patient Name: Melissa Case MRN: [id number] Epilepsy Attending: Lora Havens Referring Physician/Provider: Dr Amie Portland Duration: 11/17/2020 1825 to 11/18/2020 1142, 1949  to 11/19/2020 0300  Patient history: 60 year old with past medical history of a 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 EEG to evaluate for seizure  Level of alertness: Awake, asleep  AEDs during EEG study: LEV, PHT  Technical aspects: This EEG study was done with scalp electrodes positioned according to the 10-20 International system of electrode placement. Electrical activity was acquired at a sampling rate of 500Hz  and reviewed with a high frequency filter of 70Hz  and a low frequency filter of 1Hz . EEG data were recorded continuously and digitally stored.  Description: No posterior dominant rhythm was seen. EEG showed continuous generalized and lateralized polymorphic sharply contoured 5 to 9 Hz theta-alpha  activity. Continuous 2-3hz  delta slowing was also noted in right temporo-parietal region. Lateralized periodic discharges were noted in right hemisphere, maximal ight temporo-parietal region, at 0.5-1hz  with superimposed rhythmicity at times.  Seizures without clinical signs were also noted arising from right temporoparietal region, average 2-3 seizures per hour, lasting about 15 to 45 seconds each.  EEG was interrupted between 11/18/2020 1142 to 1949 for procedures.  After 1949, EEG continued to show lateralized periodic discharges in right hemisphere, maximal right temporoparietal region.  Brief ictal-interictal rhythmic discharges were also noted in right temporoparietal region.  ABNORMALITY -Seizure without clinical signs, right temporoparietal region -Brief ictal-interictal rhythmic discharges, right temporoparietal region (BIRDs) - Lateralized periodic discharges with overriding rhythmic activity ( LPD +) right, maximal temporo-parietal region - Continuous slow, generalized and maximal right temporo-parietal region.  IMPRESSION: This study showed seizures without clinical signs arising from right temporoparietal region, average 2- 3/hour, lasting about 15 to 45 seconds each.  After 1949 on 11/18/2020, EEG showed brief ictal-interictal rhythmic discharges arising from right temporoparietal region in addition to lateralized periodic discharges with overriding rhythmic activity which are on the ictal-interictal continuum with high potential for seizures.  There is also evidence of cortical dysfunction arising from right temporo-parietal region likely secondary to underlying structural abnormality/ mass.  Additionally, there is moderate diffuse encephalopathy, nonspecific etiology.  Priyanka [name]   DG FL GUIDED LUMBAR PUNCTURE  Result Date: 11/19/2020 CLINICAL DATA:  60 year old female with mental status changes, for lumbar puncture. EXAM: DIAGNOSTIC LUMBAR PUNCTURE UNDER FLUOROSCOPIC GUIDANCE COMPARISON:   Prior exams FLUOROSCOPY TIME:  Fluoroscopy Time:  48 seconds Number of Acquired Spot Images: 2 PROCEDURE: Informed consent was obtained from the patient prior to the procedure, including potential complications of  headache, allergy, and pain. With the patient prone, the lower back was prepped with Betadine. 1% Lidocaine was used for local anesthesia. Lumbar puncture was performed at the L4-5 level using a 20 gauge needle with return of clear colorless CSF with an opening pressure of 13 cm water. Sixteen ml of CSF were obtained for laboratory studies. The patient tolerated the procedure well and there were no apparent complications. IMPRESSION: Fluoroscopic guided lumbar puncture.  No immediate complication. Electronically Signed   By: Margarette Canada M.D.   On: 11/19/2020 11:54   CT STEALTH HEAD W/O CONTRAST  Result Date: 11/18/2020 CLINICAL DATA:  Brain mass. Surgical planning. Right parotid malignancy EXAM: CT HEAD WITHOUT CONTRAST TECHNIQUE: Contiguous axial images were obtained from the base of the skull through the vertex without intravenous contrast. COMPARISON:  CT and MRI head 11/17/2020 FINDINGS: Brain: Mass lesion in the right temporal lobe contains small calcifications and white matter edema throughout the right temporal lobe. No change from yesterday. No associated hemorrhage. Ventricle size normal. No midline shift. No acute infarct. No other mass lesion identified. There is streak artifact from metallic implant in the right eyelid. Vascular: Negative for hyperdense vessel Skull: Negative Sinuses/Orbits: Retention cyst right maxillary sinus. Remaining paranasal sinuses clear. Right mastoid effusion. Left mastoid clear. Metallic implant in the right eyelid. Other: Right parotidectomy. IMPRESSION: Mass lesion right temporal lobe unchanged. Mass contains small areas of calcification diffuse white matter edema. No change from yesterday. Electronically Signed   By: Franchot Gallo M.D.   On: 11/18/2020 12:59

## 2020-11-22 NOTE — Progress Notes (Signed)
Speech Language Pathology Treatment: Dysphagia  Patient Details Name: Melissa Case MRN: [id number] DOB: April 24, 1961 Today's Date: 11/22/2020 Time: 1208-1223 SLP Time Calculation (min) (ACUTE ONLY): 15 min  Assessment / Plan / Recommendation Clinical Impression  Pt was seen for dysphagia treatment with her husband present. She was alert and cooperative throughout the session. Pt, nursing, and her husband reported that the pt has been tolerating the current diet without overt s/sx of aspiration. Pt tolerated regular texture solids, and thin liquids via straw without overt s/sx of aspiration. Mastication was [facility] and oral clearance was adequate. Pt reported that she mostly eats soft solids at home with intake of some regular texture solids such as salads and crackers. Diet advancement to regular texture solids was discussed with the pt, but she indicated that she would prefer to continue the current dysphagia 3 diet with thin liquids since she would rather "take it easy". Pt's current diet of dysphagia 3 solids and thin liquids will therefore be continued per pt's preference, but it may be advanced to regular if she subsequently wishes. Further skilled SLP services are not clinically indicated at this time.    HPI HPI: Pt is a 60 year old female who presented after an episode of seizure-like activity.  CT head revealing of a new right temporal lobe vasogenic edema with concerns for metastatic process.  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 EEG 7/3: evidence of epileptogenicity as well as cortical dysfunction arising from right temporo-parietal region likely secondary to underlying structural abnormality/ mass.      SLP Plan  All goals met;Discharge SLP treatment  due to (comment)       Recommendations  Diet recommendations: Dysphagia 3 (mechanical soft);Thin liquid (advance to regular texture if pt desires) Liquids provided via: Cup;Straw Medication Administration: Whole meds with liquid Supervision: Staff to assist with self feeding Compensations:  (Check oral cavity for residue if lethargic) Postural Changes and/or Swallow Maneuvers: Seated upright 90 degrees                Oral Care Recommendations: Oral care BID Follow up Recommendations: None SLP Visit Diagnosis: Dysphagia, oral phase (R13.11) Plan: All goals met;Discharge SLP treatment due to (comment)       Adalie Mand I. Vear Clock, MS, CCC-SLP Acute Rehabilitation Services Office number [phone] Pager [phone]               Scheryl Marten 11/22/2020, 1:39 PM

## 2020-11-23 LAB — VDRL, CSF: VDRL Quant, CSF: NONREACTIVE

## 2020-11-23 MED ORDER — PHENYTOIN SODIUM EXTENDED 100 MG PO CAPS
100.0000 mg | ORAL_CAPSULE | Freq: Three times a day (TID) | ORAL | Status: DC
  Administered 2020-11-23 – 2020-11-25 (×6): 100 mg via ORAL
  Filled 2020-11-23 (×8): qty 1

## 2020-11-23 MED ORDER — LEVETIRACETAM 500 MG PO TABS
1000.0000 mg | ORAL_TABLET | Freq: Two times a day (BID) | ORAL | Status: DC
  Administered 2020-11-23 – 2020-11-25 (×4): 1000 mg via ORAL
  Filled 2020-11-23 (×4): qty 2

## 2020-11-23 NOTE — Plan of Care (Signed)
  Problem: Education: Goal: Knowledge of General Education information will improve Description: Including pain rating scale, medication(s)/side effects and non-pharmacologic comfort measures Outcome: Progressing   Problem: Health Behavior/Discharge Planning: Goal: Ability to manage health-related needs will improve Outcome: Progressing   Problem: Activity: Goal: Risk for activity intolerance will decrease Outcome: Progressing   Problem: Elimination: Goal: Will not experience complications related to bowel motility Outcome: Progressing   

## 2020-11-23 NOTE — Progress Notes (Signed)
Subjective: Patient reports that "I'm feeling much better today." No acute events reported overnight.   Objective: Vital signs in last 24 hours: Temp:  [98.2 F (36.8 C)-99.2 F (37.3 C)] 99.2 F (37.3 C) (07/05 0806) Pulse Rate:  [75-100] 87 (07/05 0806) Resp:  [15-19] 19 (07/05 0806) BP: (102-132)/(60-82) 107/65 (07/05 0806) SpO2:  [94 %-95 %] 94 % (07/05 0806)  Intake/Output from previous day: 07/04 0701 - 07/05 0700 In: 1003.9 [P.O.:360; IV Piggyback:643.9] Out: -  Intake/Output this shift: No intake/output data recorded.  Physical Exam:  Speech is fluent and appropriate. Doing well.   Dressing is clean dry intact. Incision is well approximated with no drainage, erythema, or edema.  LSO brace in place   Physical Exam:  Patient is in NAD, awake, A/O X 4, conversant, and in good spirits. Thought content appropriate. Speech appropriate. Able to follow 3 step commands without difficulty. PERRLA, EOMI. Right-sided facial paralysis (chroinic). MAEW with good strength that is symmetric bilaterally. 5/5 BUE/BLE. No drift.    Lab Results: Recent Labs    11/21/20 0117 11/22/20 0101  WBC 6.5 6.3  HGB 13.7 14.9  HCT 40.8 44.7  PLT 208 243   BMET Recent Labs    11/21/20 0117 11/22/20 0101  NA 136 137  K 3.6 4.3  CL 101 99  CO2 26 31  GLUCOSE 91 117*  BUN 10 10  CREATININE 0.62 0.73  CALCIUM 9.0 9.6    Studies/Results: No results found.  Assessment/Plan: 60 y.o. 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 Awaiting final LP results (culture/cytology pending). Patient may require biopsy of lesion this week with Dr. Reatha Armour  - Continue AEDs per neurology - Continue supportive care   LOS: 6 days     Marvis Moeller, DNP, NP-C 11/23/2020, 10:49 AM

## 2020-11-23 NOTE — Therapy (Signed)
OT Cancellation Note  Patient Details Name: Porchea Charrier MRN: [id number] DOB: 1960/10/07   Cancelled Treatment:      Reason Eval/Treat Not Completed: Active bedrest order per order set, to remain flat in bed as much as possible today. Will hold OT for now, will check back on next date for OT treatment.  Percell Miller Beth Dixon, OTR/L 11/23/2020, 9:39 AM

## 2020-11-23 NOTE — Progress Notes (Signed)
PT Cancellation Note  Patient Details Name: Melissa Case MRN: [id number] DOB: 04/17/1961   Cancelled Treatment:    Reason Eval/Treat Not Completed: Active bedrest order per order set, to remain flat in bed as much as possible today. Holding PT for now, will check back on next date of service.   Windell Norfolk, DPT, PN1   Supplemental Physical Therapist [facility]    Pager [phone] Acute Rehab Office [phone]

## 2020-11-23 NOTE — Progress Notes (Signed)
PROGRESS NOTE                                                                                                                                                                                                             Patient Demographics:    Melissa Case, is a 60 y.o. female, DOB - 07/23/1960, QPY:[id number]  Outpatient Primary MD for the patient is Patient, No Pcp Per (Inactive)    LOS - 6  Admit date - 11/17/2020    Chief Complaint  Patient presents with   Altered Mental Status       Brief Narrative 60 year old with past history of  high-grade carcinoma of right parotid gland-s/p right parotidectomy on 07/22/2019-and postoperative radiation-presenting with seizure-like activity.    Significant studies: 6/29>> CT head: Moderate size vasogenic edema in the right temporal lobe 6/29>> MRI brain: 2.4 x 1.4 x 0.8 peripherally enhancing focus in the right temporal lobe with vasogenic edema. 6/29>> spot EEG :epileptogenicity arising from right temporo-parietal region 6/29-6/30>> LTM EEG: Seizure arising from right temporoparietal region 7/1>> CSF oligoclonal bands: Pending  Procedures: 7/1>> LP  Culture data: CSF culture: No growth CSF HSV PCR: negative CSF VDRL: Pending CSF fungal culture: Pending  Cytology: CSF cytology: Pending    Subjective:   No major issues overnight-lying comfortably in bed.   Assessment  & Plan :    Seizures: No further seizures-stable on Keppra/phenytoin-no further recommendations from neurology.      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  Remains on Decadron-HSV PCR CSF negative-have discontinued acyclovir.    History of high-grade carcinoma of right parotid gland-right parotidectomy (March 2021) followed by postoperative radiation (4/26 2021 through 10/27/18/2021)-complicated by right facial  paralysis.  GERD: Continue PPI  Anxiety/depression: Stable-on Paxil      Condition - Extremely Guarded  Family Communication  : Spouse at bedside on 7/4  Code Status :  Full  Consults  :  Neuro, N.Surg, N.Onco, IR  PUD Prophylaxis : PPI       Disposition Plan  :    Status is: Inpatient  Remains inpatient appropriate because:IV treatments appropriate due to intensity of illness or inability to take PO  Dispo: The patient is from: Home              Anticipated d/c  is to: SNF              Patient currently is not medically stable to d/c.   Difficult to place patient No  DVT Prophylaxis  :  SCDs-as possible craniotomy/brain biopsy later this week.  Place and maintain sequential compression device Start: 11/18/20 1226  Lab Results  Component Value Date   PLT 243 11/22/2020    Diet :  Diet Order             DIET DYS 3 Room service appropriate? Yes; Fluid consistency: Thin  Diet effective now                    Inpatient Medications  Scheduled Meds:  dexamethasone (DECADRON) injection  4 mg Intravenous Q6H   levETIRAcetam  1,000 mg Oral BID   nicotine  21 mg Transdermal Daily   pantoprazole  40 mg Oral Daily   PARoxetine  40 mg Oral Daily   phenytoin  100 mg Oral TID   Continuous Infusions:   PRN Meds:.acetaminophen, docusate sodium, LORazepam, [DISCONTINUED] ondansetron **OR** ondansetron (ZOFRAN) IV, polyvinyl alcohol, traZODone  Antibiotics  :    Anti-infectives (From admission, onward)    Start     Dose/Rate Route Frequency Ordered Stop   11/17/20 1930  acyclovir (ZOVIRAX) 680 mg in dextrose 5 % 100 mL IVPB  Status:  Discontinued        10 mg/kg  68 kg 113.6 mL/hr over 60 Minutes Intravenous Every 8 hours 11/17/20 1857 11/23/20 1030        Time Spent in minutes  25   Oren Binet M.D on 11/23/2020 at 3:21 PM  To page go to [email or web address]   [facility] -  Office  [phone]    See all Orders from today for further  details    Objective:   Vitals:   11/23/20 0353 11/23/20 0400 11/23/20 0806 11/23/20 1223  BP: 102/63 110/60 107/65 113/69  Pulse: 75 76 87 84  Resp: 16 18 19 17   Temp: 98.6 F (37 C) 98.2 F (36.8 C) 99.2 F (37.3 C) 98 F (36.7 C)  TempSrc:  Oral Oral Oral  SpO2: 95% 94% 94% 96%  Weight:      Height:        Wt Readings from Last 3 Encounters:  11/21/20 72 kg  10/26/20 68 kg  05/28/20 68.5 kg     Intake/Output Summary (Last 24 hours) at 11/23/2020 1521 Last data filed at 11/23/2020 1049 Gross per 24 hour  Intake 1243.93 ml  Output --  Net 1243.93 ml      Physical Exam Gen Exam:Alert awake-not in any distress HEENT:atraumatic, normocephalic Chest: B/L clear to auscultation anteriorly CVS:S1S2 regular Abdomen:soft non tender, non distended Extremities:no edema Neurology: Right facial weakness which is chronic-otherwise nonfocal. Skin: no rash      Data Review:    CBC Recent Labs  Lab 11/17/20 1210 11/17/20 1224 11/19/20 0048 11/20/20 0132 11/21/20 0117 11/22/20 0101  WBC 6.6  --  10.1 6.7 6.5 6.3  HGB 14.2 15.3* 12.3 12.7 13.7 14.9  HCT 44.4 45.0 38.0 37.8 40.8 44.7  PLT 313  --  255 234 208 243  MCV 94.5  --  92.2 89.6 89.5 90.3  MCH 30.2  --  29.9 30.1 30.0 30.1  MCHC 32.0  --  32.4 33.6 33.6 33.3  RDW 13.2  --  13.2 13.0 13.0 13.1  LYMPHSABS 0.6*  --  1.0 1.7 1.6 1.0  MONOABS 0.3  --  0.6 0.6 0.5 0.4  EOSABS 0.0  --  0.0 0.0 0.0 0.0  BASOSABS 0.1  --  0.0 0.0 0.0 0.0     Recent Labs  Lab 11/17/20 1210 11/17/20 1219 11/17/20 1224 11/17/20 1416 11/18/20 0141 11/18/20 0840 11/19/20 0048 11/20/20 0132 11/21/20 0117 11/22/20 0101  NA 136  --    < >  --  135  --  138 137 136 137  K 4.2  --    < >  --  3.7  --  3.5 3.7 3.6 4.3  CL 102  --    < >  --  102  --  102 102 101 99  CO2 22  --   --   --  27  --  30 29 26 31   GLUCOSE 197*  --    < >  --  126*  --  101* 98 91 117*  BUN 12  --    < >  --  5*  --  6 8 10 10   CREATININE 0.73  --     < >  --  0.57  --  0.63 0.63 0.62 0.73  CALCIUM 9.1  --   --   --  8.8*  --  8.8* 8.8* 9.0 9.6  AST 31  --   --   --   --   --  19 17 18 27   ALT 21  --   --   --   --   --  16 18 17  33  ALKPHOS 81  --   --   --   --   --  61 61 65 73  BILITOT 0.3  --   --   --   --   --  0.6 0.5 0.7 0.4  ALBUMIN 4.1  --   --   --   --   --  3.5 3.4* 3.6 3.9  MG  --   --   --   --   --   --  2.1 2.0 2.0 2.2  LATICACIDVEN  --  4.5*  --  1.3  --   --   --   --   --   --   INR 0.9  --   --   --   --  1.0  --   --   --   --   HGBA1C 5.8*  --   --   --   --   --   --   --   --   --   BNP  --   --   --   --   --   --  88.5 31.5 14.0 7.2   < > = values in this interval not displayed.     ------------------------------------------------------------------------------------------------------------------ No results for input(s): CHOL, HDL, LDLCALC, TRIG, CHOLHDL, LDLDIRECT in the last 72 hours.  Lab Results  Component Value Date   HGBA1C 5.8 (H) 11/17/2020   ------------------------------------------------------------------------------------------------------------------ No results for input(s): TSH, T4TOTAL, T3FREE, THYROIDAB in the last 72 hours.  Invalid input(s): FREET3  Cardiac Enzymes No results for input(s): CKMB, TROPONINI, MYOGLOBIN in the last 168 hours.  Invalid input(s): CK ------------------------------------------------------------------------------------------------------------------    Component Value Date/Time   BNP 7.2 11/22/2020 0101    Micro Results Recent Results (from the past 240 hour(s))  Resp Panel by RT-PCR (Flu A&B, Covid) Nasopharyngeal Swab     Status: None   Collection Time: 11/17/20 12:15 PM   Specimen: Nasopharyngeal Swab; Nasopharyngeal(NP)  swabs in vial transport medium  Result Value Ref Range Status   SARS Coronavirus 2 by RT PCR NEGATIVE NEGATIVE Final    Comment: (NOTE) SARS-CoV-2 target nucleic acids are NOT DETECTED.  The SARS-CoV-2 RNA is generally  detectable in upper respiratory specimens during the acute phase of infection. The lowest concentration of SARS-CoV-2 viral copies this assay can detect is 138 copies/mL. A negative result does not preclude SARS-Cov-2 infection and should not be used as the sole basis for treatment or other patient management decisions. A negative result may occur with  improper specimen collection/handling, submission of specimen other than nasopharyngeal swab, presence of viral mutation(s) within the areas targeted by this assay, and inadequate number of viral copies(<138 copies/mL). A negative result must be combined with clinical observations, patient history, and epidemiological information. The expected result is Negative.  Fact Sheet for Patients:  EntrepreneurPulse.com.au  Fact Sheet for Healthcare Providers:  IncredibleEmployment.be  This test is no t yet approved or cleared by the [place] FDA and  has been authorized for detection and/or diagnosis of SARS-CoV-2 by FDA under an Emergency Use Authorization (EUA). This EUA will remain  in effect (meaning this test can be used) for the duration of the COVID-19 declaration under Section 564(b)(1) of the Act, 21 U.S.C.section 360bbb-3(b)(1), unless the authorization is terminated  or revoked sooner.       Influenza A by PCR NEGATIVE NEGATIVE Final   Influenza B by PCR NEGATIVE NEGATIVE Final    Comment: (NOTE) The Xpert Xpress SARS-CoV-2/FLU/RSV plus assay is intended as an aid in the diagnosis of influenza from Nasopharyngeal swab specimens and should not be used as a sole basis for treatment. Nasal washings and aspirates are unacceptable for Xpert Xpress SARS-CoV-2/FLU/RSV testing.  Fact Sheet for Patients: EntrepreneurPulse.com.au  Fact Sheet for Healthcare Providers: IncredibleEmployment.be  This test is not yet approved or cleared by the [place] FDA  and has been authorized for detection and/or diagnosis of SARS-CoV-2 by FDA under an Emergency Use Authorization (EUA). This EUA will remain in effect (meaning this test can be used) for the duration of the COVID-19 declaration under Section 564(b)(1) of the Act, 21 U.S.C. section 360bbb-3(b)(1), unless the authorization is terminated or revoked.  Performed at [facility], [place] [address] [place], [place] [address]   CSF culture w Gram Stain     Status: None   Collection Time: 11/19/20 11:50 AM   Specimen: PATH Cytology CSF; Cerebrospinal Fluid  Result Value Ref Range Status   Specimen Description CSF  Final   Special Requests NONE  Final   Gram Stain   Final    WBC PRESENT, PREDOMINANTLY MONONUCLEAR NO ORGANISMS SEEN CYTOSPIN SMEAR    Culture   Final    NO GROWTH 3 DAYS Performed at [facility], [place] [address] [place], [place] [address]    Report Status 11/22/2020 FINAL  Final  Fungus Culture With Stain     Status: None (Preliminary result)   Collection Time: 11/19/20 11:50 AM   Specimen: PATH Cytology CSF; Cerebrospinal Fluid  Result Value Ref Range Status   Fungus Stain Final report  Final    Comment: (NOTE) Performed At: [facility] [place], [place] [id number] Rush Farmer MD [id number]    Fungus (Mycology) Culture PENDING  Incomplete   Fungal Source CSF  Final    Comment: Performed at [facility], [place] [address], [place], [place] [address]  Anaerobic culture w Gram Stain  Status: None (Preliminary result)   Collection Time: 11/19/20 11:50 AM   Specimen: PATH Cytology CSF; Cerebrospinal Fluid  Result Value Ref Range Status   Specimen Description CSF  Final   Special Requests NONE  Final   Gram Stain   Final    WBC PRESENT, PREDOMINANTLY MONONUCLEAR NO ORGANISMS SEEN CYTOSPIN SMEAR Performed at [facility], [place] [address], [place], [place] [address]    Culture   Final    NO ANAEROBES ISOLATED; CULTURE  IN PROGRESS FOR 5 DAYS   Report Status PENDING  Incomplete  Fungus Culture Result     Status: None   Collection Time: 11/19/20 11:50 AM  Result Value Ref Range Status   Result 1 Comment  Final    Comment: (NOTE) KOH/Calcofluor preparation:  no fungus observed. Performed At: [facility] [place], [place] [id number] Rush Farmer MD IH:[id number]     Radiology Reports CT HEAD WO CONTRAST  Result Date: 11/17/2020 CLINICAL DATA:  Altered mental status following a syncopal episode. Seizure-like activity. Possible stroke. History of right-sided parotid carcinoma. EXAM: CT HEAD WITHOUT CONTRAST TECHNIQUE: Contiguous axial images were obtained from the base of the skull through the vertex without intravenous contrast. COMPARISON:  Head MRI 06/25/2019 FINDINGS: Brain: There is a new moderate-sized region of vasogenic edema involving right temporal lobe white matter. There are a few small foci of calcification along the inferior aspect of the right temporal lobe which may be associated with some abnormal soft tissue in this region, however a discrete mass is not well demonstrated on this unenhanced study. There is mild local mass effect without midline shift. No acute cortically based infarct, intracranial hemorrhage, or extra-axial fluid collection is identified. The ventricles are normal in size. Vascular: Calcified atherosclerosis at the skull base. No hyperdense vessel. Skull: No fracture or suspicious osseous lesion. Sinuses/Orbits: Partially visualized mucous retention cysts in the right maxillary sinus. New moderate-sized right mastoid effusion. Right-sided eyelid weight. Other: Partially visualized sequelae of right parotidectomy. IMPRESSION: 1. New moderate-sized region of vasogenic edema in the right temporal lobe, suspicious for metastatic disease with this history. Brain MRI without and with contrast is recommended for further evaluation. 2. New right mastoid effusion.  These results were called by telephone at the time of interpretation on 11/17/2020 at 12:50 pm to Dr. Sherwood Gambler, who verbally acknowledged these results. Electronically Signed   By: Logan Bores M.D.   On: 11/17/2020 12:51   MR Brain W and Wo Contrast  Result Date: 11/17/2020 CLINICAL DATA:  Provided history: Brain mass or lesion. EXAM: MRI HEAD WITHOUT AND WITH CONTRAST TECHNIQUE: Multiplanar, multiecho pulse sequences of the brain and surrounding structures were obtained without and with intravenous contrast. CONTRAST:  57mL GADAVIST GADOBUTROL 1 MMOL/ML IV SOLN COMPARISON:  Prior head CT examinations 11/17/2020 and earlier. Brain MRI 06/25/2019. FINDINGS: Brain: Mild intermittent motion degradation. New from the prior brain MRI of 06/25/2019, there is a 2.4 x 1.4 x 0.8 cm peripherally enhancing focus within the inferior right temporal lobe (for instance as seen on series 11, image 18) (series 9, image 15). Also new from this prior examination, there is a moderate-sized focus of vasogenic edema within the right temporal lobe extending to the right temporal stem. Diffusion-weighted signal abnormality is present within the cortex of the anterior right temporal lobe, within the right hippocampus and within the right temporal stem. Persistent asymmetric enhancement along the mastoid segment of the right facial nerve (for instance as seen on series 11,  image 12). New from the prior MRI, there is asymmetric enhancement along the inferomedial aspect of the right middle cranial fossa (series 11, image 15). Mild multifocal T2/FLAIR hyperintensity elsewhere within the cerebral white matter and within the pons, nonspecific but compatible with chronic small vessel ischemic disease. No chronic intracranial blood products. No extra-axial fluid collection. No midline shift. Vascular: Expected proximal arterial flow voids. Skull and upper cervical spine: No focal marrow lesion. Sinuses/Orbits: Visualized orbits show no  acute finding. 2.8 cm right maxillary sinus mucous retention cyst. Other: Large right mastoid effusion. Sequela of interval right parotid resection. These results were called by telephone at the time of interpretation on 11/17/2020 at 5:47 pm to provider Dr.Zhang, who verbally acknowledged these results. IMPRESSION: 2.4 x 1.4 x 0.8 cm peripherally enhancing focus within the inferior right temporal lobe, new as compared to the brain MRI of 06/25/2019. Also new from this prior exam, there is moderate vasogenic edema within the right temporal lobe, extending to the right temporal stem. These findings may reflect necrosis and edema related to prior radiation therapy. However, a right temporal lobe metastasis cannot be excluded. Neuro-Oncology consultation is recommended. Diffusion-weighted signal abnormality within the cortex of the anterior right temporal lobe, within the right hippocampus and within the medial right thalamus. Findings are nonspecific. Primary considerations are encephalitis (including herpes encephalitis), seizure-related changes or acute ischemia. Sequela of interval right parotid resection. Persistent asymmetric enhancement of the mastoid segment of the right facial nerve. Residual perineural tumor at this site cannot be excluded. New from the prior brain MRI, there is subtle asymmetric enhancement along the inferomedial aspect of the right middle cranial fossa, which could reflect additional perineural tumor spread (along the greater superficial petrosal nerve). Mild chronic small vessel ischemic changes within the cerebral white matter and pons. Large right mastoid effusion. Large right maxillary sinus mucous retention cyst. Electronically Signed   By: Kellie Simmering DO   On: 11/17/2020 17:57   MR SPECTROSCOPY  Result Date: 11/19/2020 CLINICAL DATA:  60 year old female with a history of parotid carcinoma with imaging evidence of tumor extension along the right 7th nerve to the skull base. Parotid  resection and radiation. Presenting with seizure and found to have new enhancement and vasogenic edema in the right temporal lobe. MRI spectroscopy requested. EXAM: MRI HEAD WITHOUT CONTRAST TECHNIQUE: Multiplanar, multiecho pulse sequences of the brain and surrounding structures were obtained without intravenous contrast. COMPARISON:  Post treatment brain MRI 11/17/2020. Pretreatment brain MRI 06/25/2019. FINDINGS: Multi voxel type MR spectroscopy was performed at 3 Tesla in regions of interest in the: -abnormal right temporal lobe (inferior temporal dark T2 region corresponding to enhancement, and more posterior temporal lobe region of edema) And -contralateral normal left temporal lobe. Contralateral left temporal lobe spectroscopy demonstrates expected normal peaks of creatine, choline, and N-acetyl aspartate (such as on series 13). In the dark T2 inferior temporal region corresponding to the enhancing portion of the lesion on recent MRI the tracing is abnormal, with consistently elevated peaks to the right of NAA likely corresponding to combination of lipids and lactate. But varying levels of Cr and Cho ranging from relatively normal (series 15) to abnormally elevated (series 10). In the more posterior temporal lobe region which most resembles edema on anatomic MRI the tracing resembles the more normal contralateral left lobe (Mildly elevated CR and Cho with fairly normal NAA but also elevated lipid/lactate (series 1450). IMPRESSION: 1. Abnormal spectroscopy at the inferior right temporal lobe, area of dark T2 signal corresponding to  enhancement on the recent anatomic MRI. Increased lipid/lactate there which can be a marker for cell death/necrosis, but can also be seen in brain metastases. Also questionable elevation of Cr/Cho is present, which can be seen with tumor hypercellularity. 2. The posterior right temporal lobe area most resembling edema on the recent anatomic MRI has a relatively normal spectroscopy.  3. Spectroscopy favors radiation necrosis over a right temporal lobe metastasis, but additional advanced imaging correlation is recommended. Given the proximity of the temporal lobe enhancement to the skull base, Brain PET would likely be more successful than MR Perfusion imaging. Electronically Signed   By: Genevie Ann M.D.   On: 11/19/2020 12:03   CT CHEST ABDOMEN PELVIS W CONTRAST  Result Date: 11/18/2020 CLINICAL DATA:  Staging for possible brain neoplasm. EXAM: CT CHEST, ABDOMEN, AND PELVIS WITH CONTRAST TECHNIQUE: Multidetector CT imaging of the chest, abdomen and pelvis was performed following the standard protocol during bolus administration of intravenous contrast. CONTRAST:  161mL OMNIPAQUE IOHEXOL 300 MG/ML  SOLN COMPARISON:  March 21, 2020.  August 15, 2004. FINDINGS: CT CHEST FINDINGS Cardiovascular: Atherosclerosis of thoracic aorta is noted without aneurysm or dissection. Normal cardiac size. No pericardial effusion. Mediastinum/Nodes: 3.4 cm right thyroid mass is noted which is enlarged compared to prior exam. Esophagus is unremarkable. No adenopathy is noted. Lungs/Pleura: No pneumothorax or pleural effusion is noted. Mild bilateral posterior basilar subsegmental atelectasis is noted. Musculoskeletal: No chest wall mass or suspicious bone lesions identified. CT ABDOMEN PELVIS FINDINGS Hepatobiliary: No focal liver abnormality is seen. Status post cholecystectomy. No biliary dilatation. Pancreas: Unremarkable. No pancreatic ductal dilatation or surrounding inflammatory changes. Spleen: Normal in size without focal abnormality. Adrenals/Urinary Tract: Adrenal glands are unremarkable. Kidneys are normal, without renal calculi, focal lesion, or hydronephrosis. Bladder is unremarkable. Stomach/Bowel: Stomach is within normal limits. Appendix appears normal. No evidence of bowel wall thickening, distention, or inflammatory changes. Vascular/Lymphatic: Aortic atherosclerosis. No enlarged abdominal or  pelvic lymph nodes. Reproductive: Status post hysterectomy. No adnexal masses. Other: No abdominal wall hernia or abnormality. No abdominopelvic ascites. Musculoskeletal: No acute or significant osseous findings. IMPRESSION: 3.4 cm right thyroid nodule or mass is noted which is enlarged compared to prior exam. Recommend thyroid US. (Ref: J Am Coll Radiol. 2015 Feb;12(2): 143-50). No other significant abnormality seen in the chest, abdomen or pelvis. Aortic Atherosclerosis (ICD10-I70.0). Electronically Signed   By: Marijo Conception M.D.   On: 11/18/2020 14:56   EEG adult  Result Date: 11/17/2020 Lora Havens, MD     11/17/2020  8:59 PM Patient Name: Melissa Case MRN: [id number] Epilepsy Attending: Lora Havens Referring Physician/Provider: Dr Amie Portland Date: 11/17/2020 Duration: 21.32 mins Patient history: 60 year old with past medical history of a 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 EEG to evaluate for seizure Level of alertness: Awake AEDs during EEG study: LEV Technical aspects: This EEG study was done with scalp electrodes positioned according to the 10-20 International system of electrode placement. Electrical activity was acquired at a sampling rate of 500Hz  and reviewed with a high frequency filter of 70Hz  and a low frequency filter of 1Hz . EEG data were recorded continuously and digitally stored. Description: No posterior dominant rhythm was seen. EEG showed continuous generalized and lateralized polymorphic sharply contoured 5 to 9 Hz theta-alpha activity. Continuous 2-3hz  delta slowing was also noted in right temporo-parietal region. Lateralized periodic discharges were noted in right hemisphere, maximal ight temporo-parietal  region, at 1hz  with superimposed rhythmicity at times.  Hyperventilation and photic stimulation were not performed.   ABNORMALITY - Lateralized  periodic discharges with overriding rhythmic activity ( LPD +) right, maximal temporo-parietal region - Continuous slow, generalized and maximal right temporo-parietal region. IMPRESSION: This study howed evidence of epileptogenicity as well as cortical dysfunction arising from right temporo-parietal region likely secondary to underlying structural abnormality/ mass. The lateralized periodic discharges with overriding rhythmic activity are on the ictal-interictal continuum with high potential for seizures, There is also moderate diffuse encephalopathy, nonspecific etiology. No seizures were seen throughout the recording. Dr Rory Percy was notified. Priyanka [name]   Overnight EEG with video  Result Date: 11/18/2020 Lora Havens, MD     11/19/2020  8:34 AM Patient Name: Melissa Case MRN: [id number] Epilepsy Attending: Lora Havens Referring Physician/Provider: Dr Amie Portland Duration: 11/17/2020 1825 to 11/18/2020 1142, 1949  to 11/19/2020 0300  Patient history: 60 year old with past medical history of a 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 EEG to evaluate for seizure  Level of alertness: Awake, asleep  AEDs during EEG study: LEV, PHT  Technical aspects: This EEG study was done with scalp electrodes positioned according to the 10-20 International system of electrode placement. Electrical activity was acquired at a sampling rate of 500Hz  and reviewed with a high frequency filter of 70Hz  and a low frequency filter of 1Hz . EEG data were recorded continuously and digitally stored.  Description: No posterior dominant rhythm was seen. EEG showed continuous generalized and lateralized polymorphic sharply contoured 5 to 9 Hz theta-alpha activity. Continuous 2-3hz  delta slowing was also noted in right temporo-parietal region. Lateralized periodic discharges were noted in right hemisphere, maximal  ight temporo-parietal region, at 0.5-1hz  with superimposed rhythmicity at times.  Seizures without clinical signs were also noted arising from right temporoparietal region, average 2-3 seizures per hour, lasting about 15 to 45 seconds each.  EEG was interrupted between 11/18/2020 1142 to 1949 for procedures.  After 1949, EEG continued to show lateralized periodic discharges in right hemisphere, maximal right temporoparietal region.  Brief ictal-interictal rhythmic discharges were also noted in right temporoparietal region.  ABNORMALITY -Seizure without clinical signs, right temporoparietal region -Brief ictal-interictal rhythmic discharges, right temporoparietal region (BIRDs) - Lateralized periodic discharges with overriding rhythmic activity ( LPD +) right, maximal temporo-parietal region - Continuous slow, generalized and maximal right temporo-parietal region.  IMPRESSION: This study showed seizures without clinical signs arising from right temporoparietal region, average 2- 3/hour, lasting about 15 to 45 seconds each.  After 1949 on 11/18/2020, EEG showed brief ictal-interictal rhythmic discharges arising from right temporoparietal region in addition to lateralized periodic discharges with overriding rhythmic activity which are on the ictal-interictal continuum with high potential for seizures.  There is also evidence of cortical dysfunction arising from right temporo-parietal region likely secondary to underlying structural abnormality/ mass.  Additionally, there is moderate diffuse encephalopathy, nonspecific etiology.  Priyanka [name]   DG FL GUIDED LUMBAR PUNCTURE  Result Date: 11/19/2020 CLINICAL DATA:  60 year old female with mental status changes, for lumbar puncture. EXAM: DIAGNOSTIC LUMBAR PUNCTURE UNDER FLUOROSCOPIC GUIDANCE COMPARISON:  Prior exams FLUOROSCOPY TIME:  Fluoroscopy Time:  48 seconds Number of Acquired Spot Images: 2 PROCEDURE: Informed consent was obtained from the patient prior to the  procedure, including potential complications of headache, allergy, and pain. With the patient prone, the lower back was prepped with Betadine. 1% Lidocaine was used  for local anesthesia. Lumbar puncture was performed at the L4-5 level using a 20 gauge needle with return of clear colorless CSF with an opening pressure of 13 cm water. Sixteen ml of CSF were obtained for laboratory studies. The patient tolerated the procedure well and there were no apparent complications. IMPRESSION: Fluoroscopic guided lumbar puncture.  No immediate complication. Electronically Signed   By: Margarette Canada M.D.   On: 11/19/2020 11:54   CT STEALTH HEAD W/O CONTRAST  Result Date: 11/18/2020 CLINICAL DATA:  Brain mass. Surgical planning. Right parotid malignancy EXAM: CT HEAD WITHOUT CONTRAST TECHNIQUE: Contiguous axial images were obtained from the base of the skull through the vertex without intravenous contrast. COMPARISON:  CT and MRI head 11/17/2020 FINDINGS: Brain: Mass lesion in the right temporal lobe contains small calcifications and white matter edema throughout the right temporal lobe. No change from yesterday. No associated hemorrhage. Ventricle size normal. No midline shift. No acute infarct. No other mass lesion identified. There is streak artifact from metallic implant in the right eyelid. Vascular: Negative for hyperdense vessel Skull: Negative Sinuses/Orbits: Retention cyst right maxillary sinus. Remaining paranasal sinuses clear. Right mastoid effusion. Left mastoid clear. Metallic implant in the right eyelid. Other: Right parotidectomy. IMPRESSION: Mass lesion right temporal lobe unchanged. Mass contains small areas of calcification diffuse white matter edema. No change from yesterday. Electronically Signed   By: Franchot Gallo M.D.   On: 11/18/2020 12:59

## 2020-11-24 LAB — CYTOLOGY - NON PAP

## 2020-11-24 LAB — ANAEROBIC CULTURE W GRAM STAIN

## 2020-11-24 NOTE — Progress Notes (Signed)
Occupational Therapy Treatment Patient Details Name: Melissa Case MRN: [id number] DOB: May 29, 1960 Today's Date: 11/24/2020    History of present illness Pt is a 60 year old female admitted 6/29 that presented after a seizure.  She was on her way to work when she had a seizure episode that her husband witnessed.  CT revealed right temporal mass suspicious for metastatic lesion.  EEG in place on evaluation.  Neuro following pt. PMH:  parotid cancer, status post resection and radiation in 2021   OT comments  Pt progressing well towards acute OT goals. Walked in the room (bathroom distances) with no AD and no physical assist needed but did seek occasional single UE support. Pt reports she was able to complete a full wash up at the sink with no physical assist this morning. Mentation seems much improved from prior OT session. D/c recommendations as below.    Follow Up Recommendations  No OT follow up; Supervision/assist for OOB/mobility   Equipment Recommendations  3 in 1 bedside commode    Recommendations for Other Services      Precautions / Restrictions Precautions Precautions: Fall Restrictions Weight Bearing Restrictions: No       Mobility Bed Mobility               General bed mobility comments: up in recliner at start and end of session    Transfers Overall transfer level: Needs assistance Equipment used: None Transfers: Sit to/from Stand Sit to Stand: Min guard         General transfer comment: min guard for safety. to/from recliner. occasional single extremity support during dynamic tasks in standing    Balance Overall balance assessment: Needs assistance Sitting-balance support: Feet supported;No upper extremity supported Sitting balance-Leahy Scale: Good     Standing balance support: During functional activity;No upper extremity supported Standing balance-Leahy Scale: Fair Standing balance comment: sought single extremity support on first turn while  walking in the room                           ADL either performed or assessed with clinical judgement   ADL Overall ADL's : Needs assistance/impaired                         Toilet Transfer: Supervision/safety;Min guard           Functional mobility during ADLs: Supervision/safety;Min guard General ADL Comments: supervision to min guard using no AD to walk bathroom distances. Mentation seems much improved. Pt reports she completed a full sponge bath including hair wash this morning with no physical assist needed. Pt also endorses walking in the halls with staff earlier using a rw for safety.     Vision       Perception     Praxis      Cognition Arousal/Alertness: Awake/alert Behavior During Therapy: WFL for tasks assessed/performed Overall Cognitive Status: Within Functional Limits for tasks assessed                                          Exercises     Shoulder Instructions       General Comments      Pertinent Vitals/ Pain       Pain Assessment: No/denies pain  Home Living  Prior Functioning/Environment              Frequency  Min 2X/week        Progress Toward Goals  OT Goals(current goals can now be found in the care plan section)  Progress towards OT goals: Progressing toward goals  Acute Rehab OT Goals Patient Stated Goal: to go home OT Goal Formulation: With patient/family Time For Goal Achievement: 12/03/20 Potential to Achieve Goals: Fair ADL Goals Pt Will Perform Grooming: standing;with modified independence Pt Will Perform Lower Body Bathing: with supervision;sitting/lateral leans;sit to/from stand Pt Will Perform Lower Body Dressing: with modified independence;sitting/lateral leans;sit to/from stand Pt Will Transfer to Toilet: with modified independence;ambulating;regular height toilet Pt Will Perform Toileting - Clothing Manipulation  and hygiene: with modified independence;sitting/lateral leans;sit to/from stand  Plan Discharge plan needs to be updated    Co-evaluation                 AM-PAC OT "6 Clicks" Daily Activity     Outcome Measure   Help from another person eating meals?: None Help from another person taking care of personal grooming?: None Help from another person toileting, which includes using toliet, bedpan, or urinal?: A Little Help from another person bathing (including washing, rinsing, drying)?: A Little Help from another person to put on and taking off regular upper body clothing?: None Help from another person to put on and taking off regular lower body clothing?: A Little 6 Click Score: 21    End of Session    OT Visit Diagnosis: Unsteadiness on feet (R26.81)   Activity Tolerance Patient tolerated treatment well   Patient Left in chair;with call bell/phone within reach   Nurse Communication          Time: 1191-4782 OT Time Calculation (min): 13 min  Charges: OT General Charges $OT Visit: 1 Visit OT Treatments $Self Care/Home Management : 8-22 mins  Tyrone Schimke, OT Acute Rehabilitation Services Pager: [phone] Office: [phone]    Hortencia Pilar 11/24/2020, 12:54 PM

## 2020-11-24 NOTE — Progress Notes (Signed)
Physical Therapy Treatment Patient Details Name: Melissa Case MRN: [id number] DOB: 09-24-1960 Today's Date: 11/24/2020    History of Present Illness Pt is a 60 year old female admitted 6/29 that presented after a seizure.  She was on her way to work when she had a seizure episode that her husband witnessed.  CT revealed right temporal mass suspicious for metastatic lesion.  EEG in place on evaluation.  Neuro following pt. PMH:  parotid cancer, status post resection and radiation in 2021    PT Comments    Pt sitting up in recliner finishing up lunch on entry, but agreeable to working with therapy. Pt reports she is finally "feeling with it again". Pt continues to be limited in safe mobility by decreased strength and balance. Pt is currently supervision for transfers and min guard for ambulation with RW. PT recommending HHPT and Rollator to assist in return to PLOF. PT will continue to follow acutely.    Follow Up Recommendations  Home health PT;Supervision for mobility/OOB     Equipment Recommendations  Other (comment) (Rollator)       Precautions / Restrictions Precautions Precautions: Fall Restrictions Weight Bearing Restrictions: No    Mobility  Bed Mobility               General bed mobility comments: up in recliner at start and end of session    Transfers Overall transfer level: Needs assistance Equipment used: None Transfers: Sit to/from Stand Sit to Stand: Supervision         General transfer comment: supervision for safety good power up and self steadying  Ambulation/Gait Ambulation/Gait assistance: Min guard Gait Distance (Feet): 280 Feet Assistive device: Rolling walker (2 wheeled) Gait Pattern/deviations: Step-through pattern;Decreased stride length Gait velocity: decr Gait velocity interpretation: >2.62 ft/sec, indicative of community ambulatory General Gait Details: min guard for safety and management of lines       Balance Overall balance  assessment: Needs assistance Sitting-balance support: Feet supported;No upper extremity supported Sitting balance-Leahy Scale: Good     Standing balance support: During functional activity;No upper extremity supported Standing balance-Leahy Scale: Fair Standing balance comment: sought single extremity support on first turn while walking in the room                            Cognition Arousal/Alertness: Awake/alert Behavior During Therapy: [facility] for tasks assessed/performed Overall Cognitive Status: Within Functional Limits for tasks assessed                                           General Comments General comments (skin integrity, edema, etc.): VSS on RA max noted HR 108 bpm      Pertinent Vitals/Pain Pain Assessment: No/denies pain     PT Goals (current goals can now be found in the care plan section) Acute Rehab PT Goals Patient Stated Goal: to go home PT Goal Formulation: With patient Time For Goal Achievement: 12/02/20 Potential to Achieve Goals: Good Progress towards PT goals: Progressing toward goals    Frequency    Min 3X/week      PT Plan Current plan remains appropriate       AM-PAC PT "6 Clicks" Mobility   Outcome Measure  Help needed turning from your back to your side while in a flat bed without using bedrails?: None Help needed moving from lying on your back  to sitting on the side of a flat bed without using bedrails?: None Help needed moving to and from a bed to a chair (including a wheelchair)?: None Help needed standing up from a chair using your arms (e.g., wheelchair or bedside chair)?: None Help needed to walk in hospital room?: A Little Help needed climbing 3-5 steps with a railing? : A Little 6 Click Score: 22    End of Session Equipment Utilized During Treatment: Gait belt Activity Tolerance: Patient tolerated treatment well Patient left: in chair;with call bell/phone within reach Nurse Communication:  Mobility status PT Visit Diagnosis: Muscle weakness (generalized) (M62.81);Unsteadiness on feet (R26.81)     Time: 2130-8657 PT Time Calculation (min) (ACUTE ONLY): 18 min  Charges:  $Gait Training: 8-22 mins                     Timmie Calix B. Beverely Risen PT, DPT Acute Rehabilitation Services Pager [phone] Office [phone]    Elon Alas Fleet 11/24/2020, 1:46 PM

## 2020-11-24 NOTE — TOC Progression Note (Deleted)
Transition of Care [facility]) - Progression Note    Patient Details  Name: Melissa Case MRN: [id number] Date of Birth: 08-Apr-1961  [facility] of [facility]) CM/SW Contact  Loletha Grayer Beverely Pace, RN Phone Number: 11/24/2020, 1:11 PM  Clinical Narrative:     Expected Discharge Plan: Home/Self Care Barriers to Discharge: Continued Medical Work up  Expected Discharge Plan and Services Expected Discharge Plan: Home/Self Care   Discharge Planning Services: CM Consult   Living arrangements for the past 2 months: Single Family Home                                       Social Determinants of Health (SDOH) Interventions    Readmission Risk Interventions No flowsheet data found.

## 2020-11-24 NOTE — TOC Progression Note (Signed)
  Transition of Care [facility]) - Progression Note    Patient Details  Name: Melissa Case MRN: [id number] Date of Birth: June 15, 1960  Transition [facility] Care [facility]) CM/SW Contact  Loletha Grayer Beverely Pace, RN Phone Number: 11/24/2020, 3:44 PM  Clinical Narrative: Patient was admitted with seizure like activity.  Case manager spoke with patient concerning discharge needs.Patient also needs to be setup with PCP. Appointment has been scheduled for Tuesday, December 21, 2020 @ 1:30pm. CM asked that patient be placed on cancellation list with the possibility of getting an earlier appointment. Referral for Home Health was called to [facility] with [facility]. [facility] has accepted patient.   Expected Discharge Plan: [place] Barriers to Discharge: Continued Medical Work up  Expected Discharge Plan and Services Expected Discharge Plan: [place] In-house Referral: NA Discharge Planning Services: CM Consult, Follow-up appt scheduled Post Acute Care Choice: [place] arrangements for the past 2 months: Single Family Home                 DME Arranged: 3-N-1, Walker rolling with seat DME Agency: [employer] Date DME Agency Contacted: 11/24/20 Time DME Agency Contacted: [phone] Representative spoke with at DME Agency: [place] Arranged: PT   Date [place]: 11/24/20 Time [place]: 1301 Representative spoke with at [place]: [place] ([place]) Interventions    Readmission Risk Interventions No flowsheet data found.

## 2020-11-24 NOTE — Progress Notes (Signed)
PROGRESS NOTE                                                                                                                                                                                                             Patient Demographics:    Melissa Case, is a 60 y.o. female, DOB - 09/11/1960, NBV:[id number]  Outpatient Primary MD for the patient is Patient, No Pcp Per (Inactive)    LOS - 7  Admit date - 11/17/2020    Chief Complaint  Patient presents with   Altered Mental Status       Brief Narrative 60 year old with past history of  high-grade carcinoma of right parotid gland-s/p right parotidectomy on 07/22/2019-and postoperative radiation-presenting with seizure-like activity.    Significant studies: 6/29>> CT head: Moderate size vasogenic edema in the right temporal lobe 6/29>> MRI brain: 2.4 x 1.4 x 0.8 peripherally enhancing focus in the right temporal lobe with vasogenic edema. 6/29>> spot EEG :epileptogenicity arising from right temporo-parietal region 6/29-6/30>> LTM EEG: Seizure arising from right temporoparietal region 7/1>> CSF oligoclonal bands: Pending  Procedures: 7/1>> LP  Culture data: CSF culture: No growth CSF HSV PCR: negative CSF VDRL: Nonreactive CSF fungal culture: Pending  Cytology: CSF cytology: No malignancy    Subjective:   Lying comfortably in bed-no chest pain or seizure activity.   Assessment  & Plan :    Seizures: No further seizures-stable on Keppra/phenytoin-no further recommendations from neurology.      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  CSF cytology negative-CSF HSV PCR negative as well.  No longer on acyclovir.  Await neurosurgical input regarding timing for craniotomy/biopsy.   History of high-grade carcinoma of right parotid gland-right parotidectomy (March 2021) followed  by postoperative radiation (4/26 2021 through 10/27/18/2021)-complicated by right facial paralysis.  GERD: Continue PPI  Anxiety/depression: Stable-on Paxil      Condition - Extremely Guarded  Family Communication  : Spouse at bedside on 7/6  Code Status :  Full  Consults  :  Neuro, N.Surg, N.Onco, IR  PUD Prophylaxis : PPI       Disposition Plan  :    Status is: Inpatient  Remains inpatient appropriate because:IV treatments appropriate due to intensity of illness or inability to take PO  Dispo: The patient is from: Home  Anticipated d/c is to: SNF              Patient currently is not medically stable to d/c.   Difficult to place patient No  DVT Prophylaxis  :  SCDs-as possible craniotomy/brain biopsy later this week.  Place and maintain sequential compression device Start: 11/18/20 1226  Lab Results  Component Value Date   PLT 243 11/22/2020    Diet :  Diet Order             DIET DYS 3 Room service appropriate? Yes; Fluid consistency: Thin  Diet effective now                    Inpatient Medications  Scheduled Meds:  dexamethasone (DECADRON) injection  4 mg Intravenous Q6H   levETIRAcetam  1,000 mg Oral BID   nicotine  21 mg Transdermal Daily   pantoprazole  40 mg Oral Daily   PARoxetine  40 mg Oral Daily   phenytoin  100 mg Oral TID   Continuous Infusions:   PRN Meds:.acetaminophen, docusate sodium, LORazepam, [DISCONTINUED] ondansetron **OR** ondansetron (ZOFRAN) IV, polyvinyl alcohol, traZODone  Antibiotics  :    Anti-infectives (From admission, onward)    Start     Dose/Rate Route Frequency Ordered Stop   11/17/20 1930  acyclovir (ZOVIRAX) 680 mg in dextrose 5 % 100 mL IVPB  Status:  Discontinued        10 mg/kg  68 kg 113.6 mL/hr over 60 Minutes Intravenous Every 8 hours 11/17/20 1857 11/23/20 1030        Time Spent in minutes  25   Oren Binet M.D on 11/24/2020 at 2:44 PM  To page go to [email or web address]   [facility] -  Office  [phone]    See all Orders from today for further details    Objective:   Vitals:   11/23/20 2355 11/24/20 0342 11/24/20 0753 11/24/20 1145  BP: (!) 118/52 99/73 99/71  107/74  Pulse: 79 68 84 80  Resp: 19 18 14 15   Temp: 98.3 F (36.8 C) 98 F (36.7 C) 97.9 F (36.6 C) 98.2 F (36.8 C)  TempSrc: Oral Axillary Oral Oral  SpO2: 95% 96% 96% 95%  Weight:      Height:        Wt Readings from Last 3 Encounters:  11/21/20 72 kg  10/26/20 68 kg  05/28/20 68.5 kg     Intake/Output Summary (Last 24 hours) at 11/24/2020 1444 Last data filed at 11/24/2020 1005 Gross per 24 hour  Intake 320 ml  Output --  Net 320 ml      Physical Exam Gen Exam:Alert awake-not in any distress HEENT:atraumatic, normocephalic Chest: B/L clear to auscultation anteriorly CVS:S1S2 regular Abdomen:soft non tender, non distended Extremities:no edema Neurology: Right facial weakness which is chronic-but otherwise nonfocal. Skin: no rash      Data Review:    CBC Recent Labs  Lab 11/19/20 0048 11/20/20 0132 11/21/20 0117 11/22/20 0101  WBC 10.1 6.7 6.5 6.3  HGB 12.3 12.7 13.7 14.9  HCT 38.0 37.8 40.8 44.7  PLT 255 234 208 243  MCV 92.2 89.6 89.5 90.3  MCH 29.9 30.1 30.0 30.1  MCHC 32.4 33.6 33.6 33.3  RDW 13.2 13.0 13.0 13.1  LYMPHSABS 1.0 1.7 1.6 1.0  MONOABS 0.6 0.6 0.5 0.4  EOSABS 0.0 0.0 0.0 0.0  BASOSABS 0.0 0.0 0.0 0.0     Recent Labs  Lab 11/18/20 0141 11/18/20 0840 11/19/20 0048 11/20/20 0132 11/21/20  0117 11/22/20 0101  NA 135  --  138 137 136 137  K 3.7  --  3.5 3.7 3.6 4.3  CL 102  --  102 102 101 99  CO2 27  --  30 29 26 31   GLUCOSE 126*  --  101* 98 91 117*  BUN 5*  --  6 8 10 10   CREATININE 0.57  --  0.63 0.63 0.62 0.73  CALCIUM 8.8*  --  8.8* 8.8* 9.0 9.6  AST  --   --  19 17 18 27   ALT  --   --  16 18 17  33  ALKPHOS  --   --  61 61 65 73  BILITOT  --   --  0.6 0.5 0.7 0.4  ALBUMIN  --   --  3.5 3.4* 3.6 3.9  MG  --   --   2.1 2.0 2.0 2.2  INR  --  1.0  --   --   --   --   BNP  --   --  88.5 31.5 14.0 7.2     ------------------------------------------------------------------------------------------------------------------ No results for input(s): CHOL, HDL, LDLCALC, TRIG, CHOLHDL, LDLDIRECT in the last 72 hours.  Lab Results  Component Value Date   HGBA1C 5.8 (H) 11/17/2020   ------------------------------------------------------------------------------------------------------------------ No results for input(s): TSH, T4TOTAL, T3FREE, THYROIDAB in the last 72 hours.  Invalid input(s): FREET3  Cardiac Enzymes No results for input(s): CKMB, TROPONINI, MYOGLOBIN in the last 168 hours.  Invalid input(s): CK ------------------------------------------------------------------------------------------------------------------    Component Value Date/Time   BNP 7.2 11/22/2020 0101    Micro Results Recent Results (from the past 240 hour(s))  Resp Panel by RT-PCR (Flu A&B, Covid) Nasopharyngeal Swab     Status: None   Collection Time: 11/17/20 12:15 PM   Specimen: Nasopharyngeal Swab; Nasopharyngeal(NP) swabs in vial transport medium  Result Value Ref Range Status   SARS Coronavirus 2 by RT PCR NEGATIVE NEGATIVE Final    Comment: (NOTE) SARS-CoV-2 target nucleic acids are NOT DETECTED.  The SARS-CoV-2 RNA is generally detectable in upper respiratory specimens during the acute phase of infection. The lowest concentration of SARS-CoV-2 viral copies this assay can detect is 138 copies/mL. A negative result does not preclude SARS-Cov-2 infection and should not be used as the sole basis for treatment or other patient management decisions. A negative result may occur with  improper specimen collection/handling, submission of specimen other than nasopharyngeal swab, presence of viral mutation(s) within the areas targeted by this assay, and inadequate number of viral copies(<138 copies/mL). A negative result  must be combined with clinical observations, patient history, and epidemiological information. The expected result is Negative.  Fact Sheet for Patients:  EntrepreneurPulse.com.au  Fact Sheet for Healthcare Providers:  IncredibleEmployment.be  This test is no t yet approved or cleared by the [place] FDA and  has been authorized for detection and/or diagnosis of SARS-CoV-2 by FDA under an Emergency Use Authorization (EUA). This EUA will remain  in effect (meaning this test can be used) for the duration of the COVID-19 declaration under Section 564(b)(1) of the Act, 21 U.S.C.section 360bbb-3(b)(1), unless the authorization is terminated  or revoked sooner.       Influenza A by PCR NEGATIVE NEGATIVE Final   Influenza B by PCR NEGATIVE NEGATIVE Final    Comment: (NOTE) The Xpert Xpress SARS-CoV-2/FLU/RSV plus assay is intended as an aid in the diagnosis of influenza from Nasopharyngeal swab specimens and should not be used as a sole basis for  treatment. Nasal washings and aspirates are unacceptable for Xpert Xpress SARS-CoV-2/FLU/RSV testing.  Fact Sheet for Patients: EntrepreneurPulse.com.au  Fact Sheet for Healthcare Providers: IncredibleEmployment.be  This test is not yet approved or cleared by the [place] FDA and has been authorized for detection and/or diagnosis of SARS-CoV-2 by FDA under an Emergency Use Authorization (EUA). This EUA will remain in effect (meaning this test can be used) for the duration of the COVID-19 declaration under Section 564(b)(1) of the Act, 21 U.S.C. section 360bbb-3(b)(1), unless the authorization is terminated or revoked.  Performed at [facility], [place] [address], [place], [place] [address]   CSF culture w Gram Stain     Status: None   Collection Time: 11/19/20 11:50 AM   Specimen: PATH Cytology CSF; Cerebrospinal Fluid  Result Value Ref Range Status    Specimen Description CSF  Final   Special Requests NONE  Final   Gram Stain   Final    WBC PRESENT, PREDOMINANTLY MONONUCLEAR NO ORGANISMS SEEN CYTOSPIN SMEAR    Culture   Final    NO GROWTH 3 DAYS Performed at [facility], [place] [address] [place], [place] [address]    Report Status 11/22/2020 FINAL  Final  Fungus Culture With Stain     Status: None (Preliminary result)   Collection Time: 11/19/20 11:50 AM   Specimen: PATH Cytology CSF; Cerebrospinal Fluid  Result Value Ref Range Status   Fungus Stain Final report  Final    Comment: (NOTE) Performed At: [facility] [place], [place] [id number] Rush Farmer MD AY:[id number]    Fungus (Mycology) Culture PENDING  Incomplete   Fungal Source CSF  Final    Comment: Performed at [facility], [place] [address] [place], [place] [address]  Anaerobic culture w Gram Stain     Status: None   Collection Time: 11/19/20 11:50 AM   Specimen: PATH Cytology CSF; Cerebrospinal Fluid  Result Value Ref Range Status   Specimen Description CSF  Final   Special Requests NONE  Final   Gram Stain   Final    WBC PRESENT, PREDOMINANTLY MONONUCLEAR NO ORGANISMS SEEN CYTOSPIN SMEAR    Culture   Final    NO ANAEROBES ISOLATED Performed at [facility], [place] [address] [place],  [address]    Report Status 11/24/2020 FINAL  Final  Fungus Culture Result     Status: None   Collection Time: 11/19/20 11:50 AM  Result Value Ref Range Status   Result 1 Comment  Final    Comment: (NOTE) KOH/Calcofluor preparation:  no fungus observed. Performed At: [facility] [place], [place] [id number] Rush Farmer MD EG:[id number]     Radiology Reports CT HEAD WO CONTRAST  Result Date: 11/17/2020 CLINICAL DATA:  Altered mental status following a syncopal episode. Seizure-like activity. Possible stroke. History of right-sided parotid carcinoma. EXAM: CT HEAD WITHOUT CONTRAST TECHNIQUE:  Contiguous axial images were obtained from the base of the skull through the vertex without intravenous contrast. COMPARISON:  Head MRI 06/25/2019 FINDINGS: Brain: There is a new moderate-sized region of vasogenic edema involving right temporal lobe white matter. There are a few small foci of calcification along the inferior aspect of the right temporal lobe which may be associated with some abnormal soft tissue in this region, however a discrete mass is not well demonstrated on this unenhanced study. There is mild local mass effect without midline shift. No acute cortically based infarct, intracranial hemorrhage, or  extra-axial fluid collection is identified. The ventricles are normal in size. Vascular: Calcified atherosclerosis at the skull base. No hyperdense vessel. Skull: No fracture or suspicious osseous lesion. Sinuses/Orbits: Partially visualized mucous retention cysts in the right maxillary sinus. New moderate-sized right mastoid effusion. Right-sided eyelid weight. Other: Partially visualized sequelae of right parotidectomy. IMPRESSION: 1. New moderate-sized region of vasogenic edema in the right temporal lobe, suspicious for metastatic disease with this history. Brain MRI without and with contrast is recommended for further evaluation. 2. New right mastoid effusion. These results were called by telephone at the time of interpretation on 11/17/2020 at 12:50 pm to Dr. Sherwood Gambler, who verbally acknowledged these results. Electronically Signed   By: Logan Bores M.D.   On: 11/17/2020 12:51   MR Brain W and Wo Contrast  Result Date: 11/17/2020 CLINICAL DATA:  Provided history: Brain mass or lesion. EXAM: MRI HEAD WITHOUT AND WITH CONTRAST TECHNIQUE: Multiplanar, multiecho pulse sequences of the brain and surrounding structures were obtained without and with intravenous contrast. CONTRAST:  2mL GADAVIST GADOBUTROL 1 MMOL/ML IV SOLN COMPARISON:  Prior head CT examinations 11/17/2020 and earlier. Brain MRI  06/25/2019. FINDINGS: Brain: Mild intermittent motion degradation. New from the prior brain MRI of 06/25/2019, there is a 2.4 x 1.4 x 0.8 cm peripherally enhancing focus within the inferior right temporal lobe (for instance as seen on series 11, image 18) (series 9, image 15). Also new from this prior examination, there is a moderate-sized focus of vasogenic edema within the right temporal lobe extending to the right temporal stem. Diffusion-weighted signal abnormality is present within the cortex of the anterior right temporal lobe, within the right hippocampus and within the right temporal stem. Persistent asymmetric enhancement along the mastoid segment of the right facial nerve (for instance as seen on series 11, image 12). New from the prior MRI, there is asymmetric enhancement along the inferomedial aspect of the right middle cranial fossa (series 11, image 15). Mild multifocal T2/FLAIR hyperintensity elsewhere within the cerebral white matter and within the pons, nonspecific but compatible with chronic small vessel ischemic disease. No chronic intracranial blood products. No extra-axial fluid collection. No midline shift. Vascular: Expected proximal arterial flow voids. Skull and upper cervical spine: No focal marrow lesion. Sinuses/Orbits: Visualized orbits show no acute finding. 2.8 cm right maxillary sinus mucous retention cyst. Other: Large right mastoid effusion. Sequela of interval right parotid resection. These results were called by telephone at the time of interpretation on 11/17/2020 at 5:47 pm to provider Dr.Zhang, who verbally acknowledged these results. IMPRESSION: 2.4 x 1.4 x 0.8 cm peripherally enhancing focus within the inferior right temporal lobe, new as compared to the brain MRI of 06/25/2019. Also new from this prior exam, there is moderate vasogenic edema within the right temporal lobe, extending to the right temporal stem. These findings may reflect necrosis and edema related to prior  radiation therapy. However, a right temporal lobe metastasis cannot be excluded. Neuro-Oncology consultation is recommended. Diffusion-weighted signal abnormality within the cortex of the anterior right temporal lobe, within the right hippocampus and within the medial right thalamus. Findings are nonspecific. Primary considerations are encephalitis (including herpes encephalitis), seizure-related changes or acute ischemia. Sequela of interval right parotid resection. Persistent asymmetric enhancement of the mastoid segment of the right facial nerve. Residual perineural tumor at this site cannot be excluded. New from the prior brain MRI, there is subtle asymmetric enhancement along the inferomedial aspect of the right middle cranial fossa, which could reflect additional perineural tumor spread (  along the greater superficial petrosal nerve). Mild chronic small vessel ischemic changes within the cerebral white matter and pons. Large right mastoid effusion. Large right maxillary sinus mucous retention cyst. Electronically Signed   By: Kellie Simmering DO   On: 11/17/2020 17:57   MR SPECTROSCOPY  Result Date: 11/19/2020 CLINICAL DATA:  60 year old female with a history of parotid carcinoma with imaging evidence of tumor extension along the right 7th nerve to the skull base. Parotid resection and radiation. Presenting with seizure and found to have new enhancement and vasogenic edema in the right temporal lobe. MRI spectroscopy requested. EXAM: MRI HEAD WITHOUT CONTRAST TECHNIQUE: Multiplanar, multiecho pulse sequences of the brain and surrounding structures were obtained without intravenous contrast. COMPARISON:  Post treatment brain MRI 11/17/2020. Pretreatment brain MRI 06/25/2019. FINDINGS: Multi voxel type MR spectroscopy was performed at 3 Tesla in regions of interest in the: -abnormal right temporal lobe (inferior temporal dark T2 region corresponding to enhancement, and more posterior temporal lobe region of edema)  And -contralateral normal left temporal lobe. Contralateral left temporal lobe spectroscopy demonstrates expected normal peaks of creatine, choline, and N-acetyl aspartate (such as on series 13). In the dark T2 inferior temporal region corresponding to the enhancing portion of the lesion on recent MRI the tracing is abnormal, with consistently elevated peaks to the right of NAA likely corresponding to combination of lipids and lactate. But varying levels of Cr and Cho ranging from relatively normal (series 15) to abnormally elevated (series 10). In the more posterior temporal lobe region which most resembles edema on anatomic MRI the tracing resembles the more normal contralateral left lobe (Mildly elevated CR and Cho with fairly normal NAA but also elevated lipid/lactate (series 1450). IMPRESSION: 1. Abnormal spectroscopy at the inferior right temporal lobe, area of dark T2 signal corresponding to enhancement on the recent anatomic MRI. Increased lipid/lactate there which can be a marker for cell death/necrosis, but can also be seen in brain metastases. Also questionable elevation of Cr/Cho is present, which can be seen with tumor hypercellularity. 2. The posterior right temporal lobe area most resembling edema on the recent anatomic MRI has a relatively normal spectroscopy. 3. Spectroscopy favors radiation necrosis over a right temporal lobe metastasis, but additional advanced imaging correlation is recommended. Given the proximity of the temporal lobe enhancement to the skull base, Brain PET would likely be more successful than MR Perfusion imaging. Electronically Signed   By: Genevie Ann M.D.   On: 11/19/2020 12:03   CT CHEST ABDOMEN PELVIS W CONTRAST  Result Date: 11/18/2020 CLINICAL DATA:  Staging for possible brain neoplasm. EXAM: CT CHEST, ABDOMEN, AND PELVIS WITH CONTRAST TECHNIQUE: Multidetector CT imaging of the chest, abdomen and pelvis was performed following the standard protocol during bolus  administration of intravenous contrast. CONTRAST:  164mL OMNIPAQUE IOHEXOL 300 MG/ML  SOLN COMPARISON:  March 21, 2020.  August 15, 2004. FINDINGS: CT CHEST FINDINGS Cardiovascular: Atherosclerosis of thoracic aorta is noted without aneurysm or dissection. Normal cardiac size. No pericardial effusion. Mediastinum/Nodes: 3.4 cm right thyroid mass is noted which is enlarged compared to prior exam. Esophagus is unremarkable. No adenopathy is noted. Lungs/Pleura: No pneumothorax or pleural effusion is noted. Mild bilateral posterior basilar subsegmental atelectasis is noted. Musculoskeletal: No chest wall mass or suspicious bone lesions identified. CT ABDOMEN PELVIS FINDINGS Hepatobiliary: No focal liver abnormality is seen. Status post cholecystectomy. No biliary dilatation. Pancreas: Unremarkable. No pancreatic ductal dilatation or surrounding inflammatory changes. Spleen: Normal in size without focal abnormality. Adrenals/Urinary Tract: Adrenal glands  are unremarkable. Kidneys are normal, without renal calculi, focal lesion, or hydronephrosis. Bladder is unremarkable. Stomach/Bowel: Stomach is within normal limits. Appendix appears normal. No evidence of bowel wall thickening, distention, or inflammatory changes. Vascular/Lymphatic: Aortic atherosclerosis. No enlarged abdominal or pelvic lymph nodes. Reproductive: Status post hysterectomy. No adnexal masses. Other: No abdominal wall hernia or abnormality. No abdominopelvic ascites. Musculoskeletal: No acute or significant osseous findings. IMPRESSION: 3.4 cm right thyroid nodule or mass is noted which is enlarged compared to prior exam. Recommend thyroid US. (Ref: J Am Coll Radiol. 2015 Feb;12(2): 143-50). No other significant abnormality seen in the chest, abdomen or pelvis. Aortic Atherosclerosis (ICD10-I70.0). Electronically Signed   By: Marijo Conception M.D.   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 EEG to evaluate for seizure Level of alertness: Awake AEDs during EEG study: LEV Technical aspects: This EEG study was done with scalp electrodes positioned according to the 10-20 International system of electrode placement. Electrical activity was acquired at a sampling rate of 500Hz  and reviewed with a high frequency filter of 70Hz  and a low frequency filter of 1Hz . EEG data were recorded continuously and digitally stored. Description: No posterior dominant rhythm was seen. EEG showed continuous generalized and lateralized polymorphic sharply contoured 5 to 9 Hz theta-alpha activity. Continuous 2-3hz  delta slowing was also noted in right temporo-parietal region. Lateralized periodic discharges were noted in right hemisphere, maximal ight temporo-parietal region, at 1hz  with superimposed rhythmicity at times.  Hyperventilation and photic stimulation were not performed.   ABNORMALITY - Lateralized periodic discharges with overriding rhythmic activity ( LPD +) right, maximal temporo-parietal region - Continuous slow, generalized and maximal right temporo-parietal region. IMPRESSION: This study howed evidence of epileptogenicity as well as cortical dysfunction arising from right temporo-parietal region likely secondary to underlying structural abnormality/ mass. The lateralized periodic discharges with overriding rhythmic activity are on the ictal-interictal continuum with high potential for seizures, There is also moderate diffuse encephalopathy, nonspecific etiology. No seizures were seen throughout the recording. Dr Rory Percy was  notified. Priyanka [name]   Overnight EEG with video  Result Date: 11/18/2020 Lora Havens, MD     11/19/2020  8:34 AM Patient Name: Manon Banbury MRN: [id number] Epilepsy Attending: Lora Havens Referring Physician/Provider: Dr Amie Portland Duration: 11/17/2020 1825 to 11/18/2020 1142, 1949  to 11/19/2020 0300  Patient history: 60 year old with past medical history of a 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 EEG to evaluate for seizure  Level of alertness: Awake, asleep  AEDs during EEG study: LEV, PHT  Technical aspects: This EEG study was done with scalp electrodes positioned according to the 10-20 International system of electrode placement. Electrical activity was acquired at a sampling rate of 500Hz  and reviewed with a high frequency filter of 70Hz  and a low frequency filter of 1Hz . EEG data were recorded continuously and digitally stored.  Description: No posterior dominant rhythm was seen. EEG showed continuous generalized and lateralized polymorphic sharply contoured 5 to 9 Hz theta-alpha activity. Continuous 2-3hz  delta slowing was  also noted in right temporo-parietal region. Lateralized periodic discharges were noted in right hemisphere, maximal ight temporo-parietal region, at 0.5-1hz  with superimposed rhythmicity at times.  Seizures without clinical signs were also noted arising from right temporoparietal region, average 2-3 seizures per hour, lasting about 15 to 45 seconds each.  EEG was interrupted between 11/18/2020 1142 to 1949 for procedures.  After 1949, EEG continued to show lateralized periodic discharges in right hemisphere, maximal right temporoparietal region.  Brief ictal-interictal rhythmic discharges were also noted in right temporoparietal region.  ABNORMALITY -Seizure without clinical signs, right temporoparietal region -Brief ictal-interictal rhythmic  discharges, right temporoparietal region (BIRDs) - Lateralized periodic discharges with overriding rhythmic activity ( LPD +) right, maximal temporo-parietal region - Continuous slow, generalized and maximal right temporo-parietal region.  IMPRESSION: This study showed seizures without clinical signs arising from right temporoparietal region, average 2- 3/hour, lasting about 15 to 45 seconds each.  After 1949 on 11/18/2020, EEG showed brief ictal-interictal rhythmic discharges arising from right temporoparietal region in addition to lateralized periodic discharges with overriding rhythmic activity which are on the ictal-interictal continuum with high potential for seizures.  There is also evidence of cortical dysfunction arising from right temporo-parietal region likely secondary to underlying structural abnormality/ mass.  Additionally, there is moderate diffuse encephalopathy, nonspecific etiology.  Priyanka [name]   DG FL GUIDED LUMBAR PUNCTURE  Result Date: 11/19/2020 CLINICAL DATA:  60 year old female with mental status changes, for lumbar puncture. EXAM: DIAGNOSTIC LUMBAR PUNCTURE UNDER FLUOROSCOPIC GUIDANCE COMPARISON:  Prior exams FLUOROSCOPY TIME:  Fluoroscopy Time:  48 seconds Number of Acquired Spot Images: 2 PROCEDURE: Informed consent was obtained from the patient prior to the procedure, including potential complications of headache, allergy, and pain. With the patient prone, the lower back was prepped with Betadine. 1% Lidocaine was used for local anesthesia. Lumbar puncture was performed at the L4-5 level using a 20 gauge needle with return of clear colorless CSF with an opening pressure of 13 cm water. Sixteen ml of CSF were obtained for laboratory studies. The patient tolerated the procedure well and there were no apparent complications. IMPRESSION: Fluoroscopic guided lumbar puncture.  No immediate complication. Electronically Signed   By: Margarette Canada M.D.   On: 11/19/2020 11:54   CT STEALTH  HEAD W/O CONTRAST  Result Date: 11/18/2020 CLINICAL DATA:  Brain mass. Surgical planning. Right parotid malignancy EXAM: CT HEAD WITHOUT CONTRAST TECHNIQUE: Contiguous axial images were obtained from the base of the skull through the vertex without intravenous contrast. COMPARISON:  CT and MRI head 11/17/2020 FINDINGS: Brain: Mass lesion in the right temporal lobe contains small calcifications and white matter edema throughout the right temporal lobe. No change from yesterday. No associated hemorrhage. Ventricle size normal. No midline shift. No acute infarct. No other mass lesion identified. There is streak artifact from metallic implant in the right eyelid. Vascular: Negative for hyperdense vessel Skull: Negative Sinuses/Orbits: Retention cyst right maxillary sinus. Remaining paranasal sinuses clear. Right mastoid effusion. Left mastoid clear. Metallic implant in the right eyelid. Other: Right parotidectomy. IMPRESSION: Mass lesion right temporal lobe unchanged. Mass contains small areas of calcification diffuse white matter edema. No change from yesterday. Electronically Signed   By: Franchot Gallo M.D.   On: 11/18/2020 12:59

## 2020-11-24 NOTE — Progress Notes (Signed)
Subjective: Patient reports that she is doing well and feels more alert and less sleepy.   Objective: Vital signs in last 24 hours: Temp:  [97.9 F (36.6 C)-98.3 F (36.8 C)] 97.9 F (36.6 C) (07/06 0753) Pulse Rate:  [68-90] 84 (07/06 0753) Resp:  [14-19] 14 (07/06 0753) BP: (99-118)/(52-74) 99/71 (07/06 0753) SpO2:  [94 %-96 %] 96 % (07/06 0753)  Intake/Output from previous day: 07/05 0701 - 07/06 0700 In: 240 [P.O.:240] Out: -  Intake/Output this shift: No intake/output data recorded.  Physical Exam: Patient is sitting up in her bedside chair in NAD, She is A/O X 4, conversant, and in good spirits. Thought content appropriate. Speech appropriate. Able to follow commands without difficulty. PERRLA, EOMI. Right-sided facial paralysis (chroinic). MAEW with good strength that is symmetric bilaterally. 5/5 BUE/BLE. No drift.   Lab Results: Recent Labs    11/22/20 0101  WBC 6.3  HGB 14.9  HCT 44.7  PLT 243   BMET Recent Labs    11/22/20 0101  NA 137  K 4.3  CL 99  CO2 31  GLUCOSE 117*  BUN 10  CREATININE 0.73  CALCIUM 9.6    Studies/Results: No results found.  Assessment/Plan: 60 y.o. 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 Continuing to await final LP results. Possible biopsy of lesion this week with Dr. Reatha Armour.    - Continue AEDs per neurology - Continue supportive care   LOS: 7 days     Marvis Moeller, DNP, NP-C 11/24/2020, 9:10 AM

## 2020-11-25 ENCOUNTER — Other Ambulatory Visit (HOSPITAL_COMMUNITY): Payer: Self-pay

## 2020-11-25 LAB — OLIGOCLONAL BANDS, CSF + SERM

## 2020-11-25 MED ORDER — LEVETIRACETAM 1000 MG PO TABS
1000.0000 mg | ORAL_TABLET | Freq: Two times a day (BID) | ORAL | 1 refills | Status: DC
  Filled 2020-11-25: qty 60, 30d supply, fill #0

## 2020-11-25 MED ORDER — PHENYTOIN SODIUM EXTENDED 100 MG PO CAPS
100.0000 mg | ORAL_CAPSULE | Freq: Three times a day (TID) | ORAL | 1 refills | Status: DC
  Filled 2020-11-25: qty 90, 30d supply, fill #0

## 2020-11-25 MED ORDER — DEXAMETHASONE 2 MG PO TABS
ORAL_TABLET | ORAL | 0 refills | Status: DC
  Filled 2020-11-25: qty 74, 21d supply, fill #0

## 2020-11-25 NOTE — Progress Notes (Signed)
Pt given discharge instructions, prescriptions, and care notes. Pt verbalized understanding AEB no further questions or concerns at this time. IV was discontinued, no redness, pain, or swelling noted at this time. Telemetry discontinued and Centralized Telemetry was notified. Pt left the floor via wheelchair with staff in stable condition. 

## 2020-11-25 NOTE — Discharge Summary (Addendum)
PATIENT DETAILS Name: Melissa Case Age: 60 y.o. Sex: female Date of Birth: 08-15-1960 MRN: [id number]. Admitting Physician: Emeline General, MD WUJ:[id number], No Pcp Per (Inactive)  Admit Date: 11/17/2020 Discharge date: 11/25/2020  Recommendations for Outpatient Follow-up:  Follow up with PCP in 1-2 weeks Please obtain CMP/CBC in one week Please ensure follow-up with neurology, neurosurgery Follow fungal cultures from CSF, oligoclonal band from CSF still pending-please follow  Admitted From:  Home  Disposition: Home with home health services   Home Health: Yes  Equipment/Devices: None  Discharge Condition: Stable  CODE STATUS: FULL CODE  Diet recommendation:  Diet Order             Diet general           DIET DYS 3 Room service appropriate? 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     Significant studies: 6/29>> CT head: Moderate size vasogenic edema in the right temporal lobe 6/29>> MRI brain: 2.4 x 1.4 x 0.8 peripherally enhancing focus in the right temporal lobe with vasogenic edema. 6/29>> spot EEG :epileptogenicity arising from right temporo-parietal region 6/29-6/30>> LTM EEG: Seizure arising from right temporoparietal region 7/1>> CSF oligoclonal bands: Pending   Procedures: 7/1>> LP   Culture data: CSF culture: No growth CSF HSV PCR: negative CSF VDRL: Nonreactive CSF fungal culture: Pending   Cytology: CSF cytology: No malignancy  Brief Hospital Course: Seizures: No further seizures-stable on Keppra/phenytoin-no further recommendations from neurology.  Will need outpatient follow-up with neurology.      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  Suspicion for radiation-induced necrosis.  See work-up as above.  Please ensure patient follows up with neurology in the outpatient setting.  Neurosurgery recommending 3-week taper of Decadron given vasogenic edema.   History of high-grade carcinoma of right parotid gland-right parotidectomy (March 2021) followed by postoperative radiation (4/26 2021 through 10/27/18/2021)-complicated by right facial paralysis.   GERD: Continue PPI   Anxiety/depression: Stable-on Paxil  Procedures None  Discharge Diagnoses:  Active Problems:   Seizure [facility])   Discharge Instructions:  Activity:  As tolerated   Discharge Instructions     Ambulatory referral to Neurology   Complete by: As directed    An appointment is requested in approximately: 2 weeks   Call MD for:   Complete by: As directed    If seizures   Call MD for:  extreme fatigue   Complete by: As directed    Call MD for:  persistant dizziness or light-headedness   Complete by: As directed    Diet general   Complete by: As directed    Discharge instructions   Complete by: As directed    Seizure precautions: Per [facility] statutes, patients with seizures are not allowed to drive until they have been seizure-free for six months and cleared by a physician    Use caution when using heavy equipment or power tools. Avoid working on ladders or at heights. Take showers instead of baths. Ensure the water temperature is not too high on the home water heater. Do not go swimming alone. Do not lock yourself in a room alone (i.e. bathroom).  When caring for infants or small children, sit down when holding, feeding, or changing them to minimize risk of injury to the child in the event you have a seizure. Maintain good sleep hygiene. Avoid alcohol.    If patient has another seizure, call 911 and bring them back to the ED if: A.  The seizure lasts  longer than 5 minutes.      B.  The patient doesn't wake shortly after the seizure or has new problems such as difficulty seeing, speaking or moving following the seizure C.  The patient was injured during the seizure D.  The patient has a temperature over 102 F (39C) E.  The patient vomited during the seizure and now is having trouble breathing    During the Seizure   - First, ensure adequate ventilation and place patients on the floor on their left side  Loosen clothing around the neck and ensure the airway is patent. If the patient is clenching the teeth, do not force the mouth open with any object as this can cause severe damage - Remove all items from the surrounding that can be hazardous. The patient may be oblivious to what's happening and may not even know what he or she is doing. If the patient is confused and wandering, either gently guide him/her away and block access to outside areas - Reassure the individual and be comforting - Call 911. In most cases, the seizure ends before EMS arrives. However, there are cases when seizures may last over 3 to 5 minutes. Or the individual may have developed breathing difficulties or severe injuries. If a pregnant patient or a person with diabetes develops a seizure, it is prudent to call an ambulance. - Finally, if the patient does not regain full consciousness, then call EMS. Most patients will remain confused for about 45 to 90 minutes after a seizure, so you must use judgment in calling for help. - Avoid restraints but make sure the patient is in a bed with padded side rails - Place the individual in a lateral position with the neck slightly flexed; this will help the saliva drain from the mouth and prevent the tongue from falling backward - Remove all nearby furniture and other hazards from the area - Provide verbal assurance as the individual is regaining consciousness - Provide the patient with privacy if possible - Call for help and start  treatment as ordered by the caregiver    After the Seizure (Postictal Stage)   After a seizure, most patients experience confusion, fatigue, muscle pain and/or a headache. Thus, one should permit the individual to sleep. For the next few days, reassurance is essential. Being calm and helping reorient the person is also of importance.   Most seizures are painless and end spontaneously. Seizures are not harmful to others but can lead to complications such as stress on the lungs, brain and the heart. Individuals with prior lung problems may develop labored breathing and respiratory distress.   Follow with Primary MD  in 1-2 weeks  Please follow with neurology and neurosurgery-you will get a call from the offices-if you do not hear from them-please give them a call.  Please get a complete blood count and chemistry panel checked by your Primary MD at your next visit, and again as instructed by your Primary MD.  Get Medicines reviewed and adjusted: Please take all your medications with you for your next visit with your Primary MD  Laboratory/radiological data: Please request your Primary MD to  go over all hospital tests and procedure/radiological results at the follow up, please ask your Primary MD to get all Hospital records sent to his/her office.  In some cases, they will be blood work, cultures and biopsy results pending at the time of your discharge. Please request that your primary care M.D. follows up on these results.  Also Note the following: If you experience worsening of your admission symptoms, develop shortness of breath, life threatening emergency, suicidal or homicidal thoughts you must seek medical attention immediately by calling 911 or calling your MD immediately  if symptoms less severe.  You must read complete instructions/literature along with all the possible adverse reactions/side effects for all the Medicines you take and that have been prescribed to you. Take any new  Medicines after you have completely understood and accpet all the possible adverse reactions/side effects.   Do not drive when taking Pain medications or sleeping medications (Benzodaizepines)  Do not take more than prescribed Pain, Sleep and Anxiety Medications. It is not advisable to combine anxiety,sleep and pain medications without talking with your primary care practitioner  Special Instructions: If you have smoked or chewed Tobacco  in the last 2 yrs please stop smoking, stop any regular Alcohol  and or any Recreational drug use.  Wear Seat belts while driving.  Please note: You were cared for by a hospitalist during your hospital stay. Once you are discharged, your primary care physician will handle any further medical issues. Please note that NO REFILLS for any discharge medications will be authorized once you are discharged, as it is imperative that you return to your primary care physician (or establish a relationship with a primary care physician if you do not have one) for your post hospital discharge needs so that they can reassess your need for medications and monitor your lab values.   Increase activity slowly   Complete by: As directed       Allergies as of 11/25/2020       Reactions   Morphine Itching   Penicillins Rash   Has patient had a PCN reaction causing immediate rash, facial/tongue/throat swelling, SOB or lightheadedness with hypotension: No Has patient had a PCN reaction causing severe rash involving mucus membranes or skin necrosis: No Has patient had a PCN reaction that required hospitalization No Has patient had a PCN reaction occurring within the last 10 years: No If all of the above answers are "NO", then may proceed with Cephalosporin use.        Medication List     TAKE these medications    acetaminophen 500 MG tablet Commonly known as: TYLENOL Take 1,000 mg by mouth every 6 (six) hours as needed for mild pain.   carboxymethylcellulose 0.5 %  Soln Commonly known as: REFRESH PLUS Place 1 drop into the right eye daily as needed (dry eye).   dexamethasone 2 MG tablet Commonly known as: DECADRON 4 mg p.o. 3 times a day for 1 week, then 2 mg p.o. 3 times a day for 1 week, then 2 mg p.o. twice a day for 4 days, then 1 mg p.o. twice a day for 2 days, and then 1 mg p.o. daily and then stop.   docusate sodium 100 MG capsule Commonly known as: COLACE Take 100 mg by mouth daily as needed for mild constipation (uses once in a while).   esomeprazole 40 MG capsule Commonly known as: NEXIUM Take 1 capsule (40 mg total) by mouth daily.   levETIRAcetam 1000 MG tablet  Commonly known as: Keppra Take 1 tablet (1,000 mg total) by mouth 2 (two) times daily.   PARoxetine 40 MG tablet Commonly known as: PAXIL Take 1 tablet (40 mg total) by mouth daily.   phenytoin 100 MG ER capsule Commonly known as: DILANTIN Take 1 capsule (100 mg total) by mouth 3 (three) times daily.   traZODone 50 MG tablet Commonly known as: DESYREL Take 1/2 to one tab as needed for insomnia. What changed:  how much to take how to take this when to take this reasons to take this additional instructions               Durable Medical Equipment  (From admission, onward)           Start     Ordered   11/24/20 1529  For home use only DME 3 n 1  Once        11/24/20 1529   11/24/20 1529  For home use only DME Other see comment  Once       Comments: Needs Rollator  Question:  Length of Need  Answer:  Lifetime   11/24/20 1529            Follow-up Information     [facility]. Go to.   Why: Your appointment is scheduled for Tuesday, December 21, 2020 at 1:30pm. You are also being placed on their wait list for an earlier date if possible Contact information: [address] [place] [address]        [facility] Follow up.   Specialty: Home Health Services Why: A representative from  [facility] will contact you to arrange start date and time for your therapy. Contact information: [address] [place] [address] [phone]         Dawley, Troy C, DO Follow up in 2 month(s).   Contact information: [address] [place] [address] [phone]         [facility] Follow up.   Why: Office will call with date/time, If you dont hear from them,please give them a call Contact information: [address]     [address] [place] [address] [phone]               Allergies  Allergen Reactions   Morphine Itching   Penicillins Rash    Has patient had a PCN reaction causing immediate rash, facial/tongue/throat swelling, SOB or lightheadedness with hypotension: No Has patient had a PCN reaction causing severe rash involving mucus membranes or skin necrosis: No Has patient had a PCN reaction that required hospitalization No Has patient had a PCN reaction occurring within the last 10 years: No If all of the above answers are "NO", then may proceed with Cephalosporin use.       Consultations: Neurology, Neurosurgery   Other Procedures/Studies: CT HEAD WO CONTRAST  Result Date: 11/17/2020 CLINICAL DATA:  Altered mental status following a syncopal episode. Seizure-like activity. Possible stroke. History of right-sided parotid carcinoma. EXAM: CT HEAD WITHOUT CONTRAST TECHNIQUE: Contiguous axial images were obtained from the base of the skull through the vertex without intravenous contrast. COMPARISON:  Head MRI 06/25/2019 FINDINGS: Brain: There is a new moderate-sized region of vasogenic edema involving right temporal lobe white matter. There are a few small foci of calcification along the inferior aspect of the right temporal lobe which may be associated with some abnormal soft tissue in this  region, however a discrete mass is not well demonstrated on this unenhanced study.  There is mild local mass effect without midline shift. No acute cortically based infarct, intracranial hemorrhage, or extra-axial fluid collection is identified. The ventricles are normal in size. Vascular: Calcified atherosclerosis at the skull base. No hyperdense vessel. Skull: No fracture or suspicious osseous lesion. Sinuses/Orbits: Partially visualized mucous retention cysts in the right maxillary sinus. New moderate-sized right mastoid effusion. Right-sided eyelid weight. Other: Partially visualized sequelae of right parotidectomy. IMPRESSION: 1. New moderate-sized region of vasogenic edema in the right temporal lobe, suspicious for metastatic disease with this history. Brain MRI without and with contrast is recommended for further evaluation. 2. New right mastoid effusion. These results were called by telephone at the time of interpretation on 11/17/2020 at 12:50 pm to Dr. Pricilla Loveless, who verbally acknowledged these results. Electronically Signed   By: Sebastian Ache M.D.   On: 11/17/2020 12:51   MR Brain W and Wo Contrast  Result Date: 11/17/2020 CLINICAL DATA:  Provided history: Brain mass or lesion. EXAM: MRI HEAD WITHOUT AND WITH CONTRAST TECHNIQUE: Multiplanar, multiecho pulse sequences of the brain and surrounding structures were obtained without and with intravenous contrast. CONTRAST:  6mL GADAVIST GADOBUTROL 1 MMOL/ML IV SOLN COMPARISON:  Prior head CT examinations 11/17/2020 and earlier. Brain MRI 06/25/2019. FINDINGS: Brain: Mild intermittent motion degradation. New from the prior brain MRI of 06/25/2019, there is a 2.4 x 1.4 x 0.8 cm peripherally enhancing focus within the inferior right temporal lobe (for instance as seen on series 11, image 18) (series 9, image 15). Also new from this prior examination, there is a moderate-sized focus of vasogenic edema within the right temporal lobe extending to the right temporal stem. Diffusion-weighted signal abnormality is present within the cortex of  the anterior right temporal lobe, within the right hippocampus and within the right temporal stem. Persistent asymmetric enhancement along the mastoid segment of the right facial nerve (for instance as seen on series 11, image 12). New from the prior MRI, there is asymmetric enhancement along the inferomedial aspect of the right middle cranial fossa (series 11, image 15). Mild multifocal T2/FLAIR hyperintensity elsewhere within the cerebral white matter and within the pons, nonspecific but compatible with chronic small vessel ischemic disease. No chronic intracranial blood products. No extra-axial fluid collection. No midline shift. Vascular: Expected proximal arterial flow voids. Skull and upper cervical spine: No focal marrow lesion. Sinuses/Orbits: Visualized orbits show no acute finding. 2.8 cm right maxillary sinus mucous retention cyst. Other: Large right mastoid effusion. Sequela of interval right parotid resection. These results were called by telephone at the time of interpretation on 11/17/2020 at 5:47 pm to provider Dr.Zhang, who verbally acknowledged these results. IMPRESSION: 2.4 x 1.4 x 0.8 cm peripherally enhancing focus within the inferior right temporal lobe, new as compared to the brain MRI of 06/25/2019. Also new from this prior exam, there is moderate vasogenic edema within the right temporal lobe, extending to the right temporal stem. These findings may reflect necrosis and edema related to prior radiation therapy. However, a right temporal lobe metastasis cannot be excluded. Neuro-Oncology consultation is recommended. Diffusion-weighted signal abnormality within the cortex of the anterior right temporal lobe, within the right hippocampus and within the medial right thalamus. Findings are nonspecific. Primary considerations are encephalitis (including herpes encephalitis), seizure-related changes or acute ischemia. Sequela of interval right parotid resection. Persistent asymmetric enhancement of  the mastoid segment of the right facial nerve. Residual perineural tumor at this site  cannot be excluded. New from the prior brain MRI, there is subtle asymmetric enhancement along the inferomedial aspect of the right middle cranial fossa, which could reflect additional perineural tumor spread (along the greater superficial petrosal nerve). Mild chronic small vessel ischemic changes within the cerebral white matter and pons. Large right mastoid effusion. Large right maxillary sinus mucous retention cyst. Electronically Signed   By: Jackey Loge DO   On: 11/17/2020 17:57   MR SPECTROSCOPY  Result Date: 11/19/2020 CLINICAL DATA:  60 year old female with a history of parotid carcinoma with imaging evidence of tumor extension along the right 7th nerve to the skull base. Parotid resection and radiation. Presenting with seizure and found to have new enhancement and vasogenic edema in the right temporal lobe. MRI spectroscopy requested. EXAM: MRI HEAD WITHOUT CONTRAST TECHNIQUE: Multiplanar, multiecho pulse sequences of the brain and surrounding structures were obtained without intravenous contrast. COMPARISON:  Post treatment brain MRI 11/17/2020. Pretreatment brain MRI 06/25/2019. FINDINGS: Multi voxel type MR spectroscopy was performed at 3 Tesla in regions of interest in the: -abnormal right temporal lobe (inferior temporal dark T2 region corresponding to enhancement, and more posterior temporal lobe region of edema) And -contralateral normal left temporal lobe. Contralateral left temporal lobe spectroscopy demonstrates expected normal peaks of creatine, choline, and N-acetyl aspartate (such as on series 13). In the dark T2 inferior temporal region corresponding to the enhancing portion of the lesion on recent MRI the tracing is abnormal, with consistently elevated peaks to the right of NAA likely corresponding to combination of lipids and lactate. But varying levels of Cr and Cho ranging from relatively normal  (series 15) to abnormally elevated (series 10). In the more posterior temporal lobe region which most resembles edema on anatomic MRI the tracing resembles the more normal contralateral left lobe (Mildly elevated CR and Cho with fairly normal NAA but also elevated lipid/lactate (series 1450). IMPRESSION: 1. Abnormal spectroscopy at the inferior right temporal lobe, area of dark T2 signal corresponding to enhancement on the recent anatomic MRI. Increased lipid/lactate there which can be a marker for cell death/necrosis, but can also be seen in brain metastases. Also questionable elevation of Cr/Cho is present, which can be seen with tumor hypercellularity. 2. The posterior right temporal lobe area most resembling edema on the recent anatomic MRI has a relatively normal spectroscopy. 3. Spectroscopy favors radiation necrosis over a right temporal lobe metastasis, but additional advanced imaging correlation is recommended. Given the proximity of the temporal lobe enhancement to the skull base, Brain PET would likely be more successful than MR Perfusion imaging. Electronically Signed   By: Odessa Fleming M.D.   On: 11/19/2020 12:03   CT CHEST ABDOMEN PELVIS W CONTRAST  Result Date: 11/18/2020 CLINICAL DATA:  Staging for possible brain neoplasm. EXAM: CT CHEST, ABDOMEN, AND PELVIS WITH CONTRAST TECHNIQUE: Multidetector CT imaging of the chest, abdomen and pelvis was performed following the standard protocol during bolus administration of intravenous contrast. CONTRAST:  OMNIPAQUE IOHEXOL 300 MG/ML  SOLN COMPARISON:  March 21, 2020.  August 15, 2004. FINDINGS: CT CHEST FINDINGS Cardiovascular: Atherosclerosis of thoracic aorta is noted without aneurysm or dissection. Normal cardiac size. No pericardial effusion. Mediastinum/Nodes: 3.4 cm right thyroid mass is noted which is enlarged compared to prior exam. Esophagus is unremarkable. No adenopathy is noted. Lungs/Pleura: No pneumothorax or pleural effusion is noted.  Mild bilateral posterior basilar subsegmental atelectasis is noted. Musculoskeletal: No chest wall mass or suspicious bone lesions identified. CT ABDOMEN PELVIS FINDINGS Hepatobiliary: No focal  liver abnormality is seen. Status post cholecystectomy. No biliary dilatation. Pancreas: Unremarkable. No pancreatic ductal dilatation or surrounding inflammatory changes. Spleen: Normal in size without focal abnormality. Adrenals/Urinary Tract: Adrenal glands are unremarkable. Kidneys are normal, without renal calculi, focal lesion, or hydronephrosis. Bladder is unremarkable. Stomach/Bowel: Stomach is within normal limits. Appendix appears normal. No evidence of bowel wall thickening, distention, or inflammatory changes. Vascular/Lymphatic: Aortic atherosclerosis. No enlarged abdominal or pelvic lymph nodes. Reproductive: Status post hysterectomy. No adnexal masses. Other: No abdominal wall hernia or abnormality. No abdominopelvic ascites. Musculoskeletal: No acute or significant osseous findings. IMPRESSION: 3.4 cm right thyroid nodule or mass is noted which is enlarged compared to prior exam. Recommend thyroid US. (Ref: J Am Coll Radiol. 2015 Feb;12(2): 143-50). No other significant abnormality seen in the chest, abdomen or pelvis. Aortic Atherosclerosis (ICD10-I70.0). Electronically Signed   By: Lupita Raider M.D.   On: 11/18/2020 14:56   EEG adult  Result Date: 11/17/2020 Charlsie Quest, MD     11/17/2020  8:59 PM Patient Name: Eugene Prosperi MRN: [id number] Epilepsy Attending: Charlsie Quest Referring Physician/Provider: Dr Milon Dikes Date: 11/17/2020 Duration: 21.32 mins Patient history: 60 year old with past medical history of a 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 EEG to evaluate for seizure Level of alertness: Awake AEDs during EEG study: LEV Technical aspects: This EEG study was  done with scalp electrodes positioned according to the 10-20 International system of electrode placement. Electrical activity was acquired at a sampling rate of 500Hz  and reviewed with a high frequency filter of 70Hz  and a low frequency filter of 1Hz . EEG data were recorded continuously and digitally stored. Description: No posterior dominant rhythm was seen. EEG showed continuous generalized and lateralized polymorphic sharply contoured 5 to 9 Hz theta-alpha activity. Continuous 2-3hz  delta slowing was also noted in right temporo-parietal region. Lateralized periodic discharges were noted in right hemisphere, maximal ight temporo-parietal region, at 1hz  with superimposed rhythmicity at times.  Hyperventilation and photic stimulation were not performed.   ABNORMALITY - Lateralized periodic discharges with overriding rhythmic activity ( LPD +) right, maximal temporo-parietal region - Continuous slow, generalized and maximal right temporo-parietal region. IMPRESSION: This study howed evidence of epileptogenicity as well as cortical dysfunction arising from right temporo-parietal region likely secondary to underlying structural abnormality/ mass. The lateralized periodic discharges with overriding rhythmic activity are on the ictal-interictal continuum with high potential for seizures, There is also moderate diffuse encephalopathy, nonspecific etiology. No seizures were seen throughout the recording. Dr Wilford Corner was notified. 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 EEG to evaluate for seizure  Level of alertness: Awake, asleep  AEDs during EEG study: LEV, PHT  Technical aspects: This EEG study was done with scalp electrodes positioned according to the 10-20 International system of electrode placement. Electrical activity was acquired at a sampling rate of 500Hz  and reviewed with a high frequency filter of 70Hz  and a low frequency filter of 1Hz . EEG data were recorded continuously  and digitally stored.  Description: No posterior dominant rhythm was seen. EEG showed continuous generalized and lateralized polymorphic sharply contoured 5 to 9 Hz theta-alpha activity. Continuous 2-3hz  delta slowing was also noted in right temporo-parietal region. Lateralized periodic discharges were noted in right hemisphere, maximal ight temporo-parietal region, at 0.5-1hz  with superimposed rhythmicity at times.  Seizures without clinical signs were also noted arising from right temporoparietal region, average 2-3 seizures per hour, lasting about 15 to 45 seconds each.  EEG was interrupted between 11/18/2020 1142 to 1949 for procedures.  After 1949, EEG continued to show lateralized periodic discharges in right hemisphere, maximal right temporoparietal region.  Brief ictal-interictal rhythmic discharges were also noted in right temporoparietal region.  ABNORMALITY -Seizure without clinical signs, right temporoparietal region -Brief ictal-interictal rhythmic discharges, right temporoparietal region (BIRDs) - Lateralized periodic discharges with overriding rhythmic activity ( LPD +) right, maximal temporo-parietal region - Continuous slow, generalized and maximal right temporo-parietal region.  IMPRESSION: This study showed seizures without clinical signs arising from right temporoparietal region, average 2- 3/hour, lasting about 15 to 45 seconds each.  After 1949 on 11/18/2020, EEG showed brief ictal-interictal rhythmic discharges arising from right temporoparietal  region in addition to lateralized periodic discharges with overriding rhythmic activity which are on the ictal-interictal continuum with high potential for seizures.  There is also evidence of cortical dysfunction arising from right temporo-parietal region likely secondary to underlying structural abnormality/ mass.  Additionally, there is moderate diffuse encephalopathy, nonspecific etiology.  Priyanka Annabelle Harman   DG FL GUIDED LUMBAR PUNCTURE  Result Date: 11/19/2020 CLINICAL DATA:  60 year old female with mental status changes, for lumbar puncture. EXAM: DIAGNOSTIC LUMBAR PUNCTURE UNDER FLUOROSCOPIC GUIDANCE COMPARISON:  Prior exams FLUOROSCOPY TIME:  Fluoroscopy Time:  48 seconds Number of Acquired Spot Images: 2 PROCEDURE: Informed consent was obtained from the patient prior to the procedure, including potential complications of headache, allergy, and pain. With the patient prone, the lower back was prepped with Betadine. 1% Lidocaine was used for local anesthesia. Lumbar puncture was performed at the L4-5 level using a 20 gauge needle with return of clear colorless CSF with an opening pressure of 13 cm water. Sixteen ml of CSF were obtained for laboratory studies. The patient tolerated the procedure well and there were no apparent complications. IMPRESSION: Fluoroscopic guided lumbar puncture.  No immediate complication. Electronically Signed   By: Harmon Pier M.D.   On: 11/19/2020 11:54   CT STEALTH HEAD W/O CONTRAST  Result Date: 11/18/2020 CLINICAL DATA:  Brain mass. Surgical planning. Right parotid malignancy EXAM: CT HEAD WITHOUT CONTRAST TECHNIQUE: Contiguous axial images were obtained from the base of the skull through the vertex without intravenous contrast. COMPARISON:  CT and MRI head 11/17/2020 FINDINGS: Brain: Mass lesion in the right temporal lobe contains small calcifications and white matter edema throughout the right temporal lobe. No change from yesterday. No associated hemorrhage.  Ventricle size normal. No midline shift. No acute infarct. No other mass lesion identified. There is streak artifact from metallic implant in the right eyelid. Vascular: Negative for hyperdense vessel Skull: Negative Sinuses/Orbits: Retention cyst right maxillary sinus. Remaining paranasal sinuses clear. Right mastoid effusion. Left mastoid clear. Metallic implant in the right eyelid. Other: Right parotidectomy. IMPRESSION: Mass lesion right temporal lobe unchanged. Mass contains small areas of calcification diffuse white matter edema. No change from yesterday. Electronically Signed   By: Marlan Palau M.D.   On: 11/18/2020 12:59     TODAY-DAY OF DISCHARGE:  Subjective:   Melissa Case today  has no headache,no chest abdominal pain,no new weakness tingling or numbness, feels much better wants to go home today.   Objective:   Blood pressure 106/72, pulse 75, temperature 99.4 F (37.4 C), temperature source Oral, resp. rate 14, height 5\' 5"  (1.651 m), weight 72 kg, SpO2 97 %.  Intake/Output Summary (Last 24 hours) at 11/25/2020 1001 Last data filed at 11/24/2020 1456 Gross per 24 hour  Intake 560 ml  Output --  Net 560 ml   Filed Weights   11/17/20 1246 11/21/20 0500  Weight: 68 kg 72 kg    Exam: Awake Alert, Oriented *3, No new F.N deficits, Normal affect [place].AT,PERRAL Supple Neck,No JVD, No cervical lymphadenopathy appriciated.  Symmetrical Chest wall movement, Good air movement bilaterally, CTAB RRR,No Gallops,Rubs or new Murmurs, No Parasternal Heave +ve B.Sounds, Abd Soft, Non tender, No organomegaly appriciated, No rebound -guarding or rigidity. No Cyanosis, Clubbing or edema, No new Rash or bruise   PERTINENT RADIOLOGIC STUDIES: No results found.   PERTINENT LAB RESULTS: CBC: No results for input(s): WBC, HGB, HCT, PLT in the last 72 hours. CMET CMP     Component Value Date/Time   NA 137 11/22/2020 0101   NA 140 07/23/2017 0820   K 4.3 11/22/2020 0101   CL 99  11/22/2020 0101   CO2 31 11/22/2020 0101   GLUCOSE 117 (H) 11/22/2020 0101   BUN 10 11/22/2020 0101   BUN 11 07/23/2017 0820   CREATININE 0.73 11/22/2020 0101   CALCIUM 9.6 11/22/2020 0101   PROT 7.2 11/22/2020 0101   PROT 7.0 07/23/2017 0820   ALBUMIN 3.9 11/22/2020 0101   ALBUMIN 4.4 07/23/2017 0820   AST 27 11/22/2020 0101   ALT 33 11/22/2020 0101   ALKPHOS 73 11/22/2020 0101   BILITOT 0.4 11/22/2020 0101   BILITOT 0.2 07/23/2017 0820   GFRNONAA >60 11/22/2020 0101   GFRAA >60 08/26/2019 0830    GFR Estimated Creatinine Clearance: 75.3 mL/min (by C-G formula based on SCr of 0.73 mg/dL). No results for input(s): LIPASE, AMYLASE in the last 72 hours. No results for input(s): CKTOTAL, CKMB, CKMBINDEX, TROPONINI in the last 72 hours. Invalid input(s): POCBNP No results for input(s): DDIMER in the last 72 hours. No results for input(s): HGBA1C in the last 72 hours. No results for input(s): CHOL, HDL, LDLCALC, TRIG, CHOLHDL, LDLDIRECT in the last 72 hours. No results for input(s): TSH, T4TOTAL, T3FREE, THYROIDAB in the last 72 hours.  Invalid input(s): FREET3 No results for input(s): VITAMINB12, FOLATE, FERRITIN, TIBC, IRON, RETICCTPCT in the last 72 hours. Coags: No results for input(s): INR in the last 72 hours.  Invalid input(s): PT Microbiology: Recent Results (from the past 240 hour(s))  Resp Panel by RT-PCR (Flu A&B, Covid) Nasopharyngeal Swab     Status: None   Collection Time: 11/17/20 12:15 PM   Specimen: Nasopharyngeal Swab; Nasopharyngeal(NP) swabs in vial transport medium  Result Value Ref Range Status   SARS Coronavirus 2 by RT PCR NEGATIVE NEGATIVE Final    Comment: (NOTE) SARS-CoV-2 target nucleic acids are NOT DETECTED.  The SARS-CoV-2 RNA is generally detectable in upper respiratory specimens during the acute phase of infection. The lowest concentration of SARS-CoV-2 viral copies this assay can detect is 138 copies/mL. A negative result does not preclude  SARS-Cov-2 infection and should not be used as the sole basis for treatment or other patient management decisions. A negative result may occur with  improper specimen collection/handling, submission of specimen other than nasopharyngeal swab, presence of viral mutation(s)  within the areas targeted by this assay, and inadequate number of viral copies(<138 copies/mL). A negative result must be combined with clinical observations, patient history, and epidemiological information. The expected result is Negative.  Fact Sheet for Patients:  BloggerCourse.com  Fact Sheet for Healthcare Providers:  SeriousBroker.it  This test is no t yet approved or cleared by the [place] FDA and  has been authorized for detection and/or diagnosis of SARS-CoV-2 by FDA under an Emergency Use Authorization (EUA). This EUA will remain  in effect (meaning this test can be used) for the duration of the COVID-19 declaration under Section 564(b)(1) of the Act, 21 U.S.C.section 360bbb-3(b)(1), unless the authorization is terminated  or revoked sooner.       Influenza A by PCR NEGATIVE NEGATIVE Final   Influenza B by PCR NEGATIVE NEGATIVE Final    Comment: (NOTE) The Xpert Xpress SARS-CoV-2/FLU/RSV plus assay is intended as an aid in the diagnosis of influenza from Nasopharyngeal swab specimens and should not be used as a sole basis for treatment. Nasal washings and aspirates are unacceptable for Xpert Xpress SARS-CoV-2/FLU/RSV testing.  Fact Sheet for Patients: BloggerCourse.com  Fact Sheet for Healthcare Providers: SeriousBroker.it  This test is not yet approved or cleared by the [place] FDA and has been authorized for detection and/or diagnosis of SARS-CoV-2 by FDA under an Emergency Use Authorization (EUA). This EUA will remain in effect (meaning this test can be used) for the duration of  the COVID-19 declaration under Section 564(b)(1) of the Act, 21 U.S.C. section 360bbb-3(b)(1), unless the authorization is terminated or revoked.  Performed at [facility], [address], [place], [place] [address]   CSF culture w Gram Stain     Status: None   Collection Time: 11/19/20 11:50 AM   Specimen: PATH Cytology CSF; Cerebrospinal Fluid  Result Value Ref Range Status   Specimen Description CSF  Final   Special Requests NONE  Final   Gram Stain   Final    WBC PRESENT, PREDOMINANTLY MONONUCLEAR NO ORGANISMS SEEN CYTOSPIN SMEAR    Culture   Final    NO GROWTH 3 DAYS Performed at [facility], [address] [place], [place] [address]    Report Status 11/22/2020 FINAL  Final  Fungus Culture With Stain     Status: None (Preliminary result)   Collection Time: 11/19/20 11:50 AM   Specimen: PATH Cytology CSF; Cerebrospinal Fluid  Result Value Ref Range Status   Fungus Stain Final report  Final    Comment: (NOTE) Performed At: [facility] [address] [place], [place] [id number] Jolene Schimke MD WG:[id number]    Fungus (Mycology) Culture PENDING  Incomplete   Fungal Source CSF  Final    Comment: Performed at [facility], [address] [place], [place] [address]  Anaerobic culture w Gram Stain     Status: None   Collection Time: 11/19/20 11:50 AM   Specimen: PATH Cytology CSF; Cerebrospinal Fluid  Result Value Ref Range Status   Specimen Description CSF  Final   Special Requests NONE  Final   Gram Stain   Final    WBC PRESENT, PREDOMINANTLY MONONUCLEAR NO ORGANISMS SEEN CYTOSPIN SMEAR    Culture   Final    NO ANAEROBES ISOLATED Performed at [facility], [address] [place], [place] [address]    Report Status 11/24/2020 FINAL  Final  Fungus Culture Result     Status: None   Collection Time: 11/19/20 11:50  AM  Result Value Ref Range Status   Result 1 Comment  Final    Comment: (NOTE) KOH/Calcofluor preparation:  no  fungus observed. Performed At: [facility] [address] [place], [place] [id number] Jolene Schimke MD VF:[id number]     FURTHER DISCHARGE INSTRUCTIONS:  Get Medicines reviewed and adjusted: Please take all your medications with you for your next visit with your Primary MD  Laboratory/radiological data: Please request your Primary MD to go over all hospital tests and procedure/radiological results at the follow up, please ask your Primary MD to get all Hospital records sent to his/her office.  In some cases, they will be blood work, cultures and biopsy results pending at the time of your discharge. Please request that your primary care M.D. goes through all the records of your hospital data and follows up on these results.  Also Note the following: If you experience worsening of your admission symptoms, develop shortness of breath, life threatening emergency, suicidal or homicidal thoughts you must seek medical attention immediately by calling 911 or calling your MD immediately  if symptoms less severe.  You must read complete instructions/literature along with all the possible adverse reactions/side effects for all the Medicines you take and that have been prescribed to you. Take any new Medicines after you have completely understood and accpet all the possible adverse reactions/side effects.   Do not drive when taking Pain medications or sleeping medications (Benzodaizepines)  Do not take more than prescribed Pain, Sleep and Anxiety Medications. It is not advisable to combine anxiety,sleep and pain medications without talking with your primary care practitioner  Special Instructions: If you have smoked or chewed Tobacco  in the last 2 yrs please stop smoking, stop any regular Alcohol  and or any Recreational drug use.  Wear Seat belts while driving.  Please note: You were cared for by a hospitalist during your hospital stay. Once you are discharged, your primary care  physician will handle any further medical issues. Please note that NO REFILLS for any discharge medications will be authorized once you are discharged, as it is imperative that you return to your primary care physician (or establish a relationship with a primary care physician if you do not have one) for your post hospital discharge needs so that they can reassess your need for medications and monitor your lab values.  Total Time spent coordinating discharge including counseling, education and face to face time equals 35 minutes.  Signed: Tywanna Seifer 11/25/2020 10:01 AM

## 2020-11-25 NOTE — Progress Notes (Signed)
   Providing Compassionate, Quality Care - Together  NEUROSURGERY PROGRESS NOTE   S: No issues overnight.   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 -Seizures are controlled -Recommend 3-week taper of Decadron -We will have the patient follow-up with a short interval MRI brain with and without contrast in approximately 2-3 months -I do not believe a biopsy is necessary at this time.  We will follow the lesion closely as an outpatient.      Thank you for allowing me to participate in this patient's care.  Please do not hesitate to call with questions or concerns.   Elwin Sleight, [facility] Cell: [phone]

## 2020-11-25 NOTE — Progress Notes (Addendum)
Telemetry tech in monitor room notified nurse of patient's rhythm (ST elevation). Patient's pain assessed and patient stated she did not feel any pain or pressure in chest. Vital signs were taken and is charted in the patient's chart. MD Oren Binet notified. Orders received to obtain an ekg. Report given to day shift nurse Judson Roch). Patient is currently receiving a ekg and patient states she still does not have any pain or pressure.

## 2020-12-03 ENCOUNTER — Other Ambulatory Visit (HOSPITAL_COMMUNITY): Payer: Self-pay

## 2020-12-20 LAB — FUNGAL ORGANISM REFLEX

## 2020-12-20 LAB — FUNGUS CULTURE WITH STAIN

## 2020-12-20 LAB — FUNGUS CULTURE RESULT

## 2020-12-21 ENCOUNTER — Ambulatory Visit: Payer: No Typology Code available for payment source | Admitting: Family

## 2020-12-27 ENCOUNTER — Encounter: Payer: Self-pay | Admitting: Neurology

## 2020-12-27 ENCOUNTER — Ambulatory Visit (INDEPENDENT_AMBULATORY_CARE_PROVIDER_SITE_OTHER): Payer: No Typology Code available for payment source | Admitting: Neurology

## 2020-12-27 VITALS — BP 113/67 | HR 73 | Ht 64.0 in | Wt 142.0 lb

## 2020-12-27 DIAGNOSIS — C07 Malignant neoplasm of parotid gland: Secondary | ICD-10-CM | POA: Diagnosis not present

## 2020-12-27 DIAGNOSIS — R569 Unspecified convulsions: Secondary | ICD-10-CM | POA: Diagnosis not present

## 2020-12-27 MED ORDER — PHENYTOIN SODIUM EXTENDED 100 MG PO CAPS: 100.0000 mg | ORAL_CAPSULE | Freq: Three times a day (TID) | ORAL | 1 refills | Status: DC

## 2020-12-27 MED ORDER — LEVETIRACETAM 750 MG PO TABS
750.0000 mg | ORAL_TABLET | Freq: Two times a day (BID) | ORAL | 11 refills | Status: DC
Start: 2020-12-27 — End: 2021-02-23

## 2020-12-27 NOTE — Progress Notes (Signed)
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  Patient stated she had a first lifetime seizure on June 7 of this year.  Per husband, on the day she was not acting like her normal self.  She was having trouble with her right hand.  Husband decided to call 911 and soon after he helped her to the floor because she seems like she was falling. While on the ground she had a generalized shaking episode lasting about a minute and followed by sleepiness and tiredness.  She was taken to [facility] where she was admitted for further workup.  Patient states that she does not remember the seizures and does not remember how she got in the hospital. She was having memory issues in the hospital. In the hospital she was on hooked up on EEG which showed subclinical seizures coming from the right temporoparietal region.  She was started on levetiracetam and phenytoin.    Patient also had a MRI brain which showed a 2.4 x 1.4 x 0.8 cm peripherally enhancing focus within the inferior right temporal lobe which was new compared to the previous study.  There was also moderate vasogenic edema within the right temporal lobe She was also seen by neurosurgery who recommended a repeat MRI in 2 to 3 months and she was also started on Decadron for vasogenic edema.  Patient reported since leaving the hospital she has not had any additional seizures.  She is on Keppra 1000 mg  twice a day and phenytoin 100 mg 3 times a day.  She reports side effects including increase daytime sleepiness, some nausea and also increase anxiety.  She feels like she has to do adjust to the medication and the new diagnosis.  Denies any mood swing denies any dizziness, no falls   Seizure Type:  Possible focal seizure  Current frequency: only one time on June 7   Seizure risk factors: parotid gland tumors s/p radiation, no family seizure history.   Previous ASMs: None   Currenty ASMs: Levetiracetam, Phenytoin  Side effects: Sleepiness, nausea, anxiety   Brain images:  MRI Brain:  2.4 x 1.4 x 0.8 cm peripherally enhancing focus within the inferior right temporal lobe, new as compared to the brain MRI of 06/25/2019. Also new from this prior exam, there is moderate vasogenic edema within the right temporal lobe, extending to the right temporal stem. These findings may reflect necrosis and edema related to prior radiation therapy. However, a right temporal lobe metastasis cannot be excluded. Neuro-Oncology consultation is recommended.   Diffusion-weighted signal abnormality within the cortex of the anterior right temporal lobe, within the right hippocampus and within the medial right thalamus. Findings are nonspecific. Primary considerations are encephalitis (including herpes encephalitis), seizure-related changes or acute ischemia.   Sequela of interval right parotid resection. Persistent asymmetric enhancement of the mastoid segment of the right facial nerve. Residual perineural tumor at this site cannot be excluded.   New  from the prior brain MRI, there is subtle asymmetric enhancement along the inferomedial aspect of the right middle cranial fossa, which could reflect additional perineural tumor spread (along the greater superficial petrosal nerve).   Mild chronic small vessel ischemic changes within the cerebral white matter and pons.   Large right mastoid effusion.    Large right maxillary sinus mucous retention cyst.   Previous EEG:  Overnight EEG 11/19/2019  ABNORMALITY -Seizure without clinical signs, right temporoparietal region -Brief ictal-interictal rhythmic discharges, right temporoparietal region (BIRDs) - Lateralized periodic discharges with overriding rhythmic activity ( LPD +) right, maximal temporo-parietal region - Continuous slow, generalized and maximal right temporo-parietal region.   IMPRESSION: This study showed seizures without clinical signs arising from right temporoparietal region, average 2- 3/hour, lasting about 15 to 45 seconds each.  After 1949 on 11/18/2020, EEG showed brief ictal-interictal rhythmic discharges arising from right temporoparietal region in addition to lateralized periodic discharges with overriding rhythmic activity which are on the ictal-interictal continuum with high potential for seizures.  There is also evidence of cortical dysfunction arising from right temporo-parietal region likely secondary to underlying structural abnormality/ mass.  Additionally, there is moderate diffuse encephalopathy, nonspecific etiology.   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 Standing up the whole time, has been working there for 5 years. Currently if out of work due to disability.    REVIEW OF SYSTEMS: Full 14 system review of systems performed and negative with exception of: as noted in the HPI  ALLERGIES: Allergies  Allergen Reactions   Morphine Itching   Penicillins Rash    Has patient had a PCN reaction causing immediate rash, facial/tongue/throat swelling, SOB or lightheadedness with hypotension: No Has patient had a PCN reaction causing severe rash involving mucus membranes or skin necrosis: No Has patient had a PCN reaction that required hospitalization No Has patient had a PCN reaction occurring within the last 10 years: No If all of the above answers are "NO", then may  proceed with Cephalosporin use.     HOME MEDICATIONS: Outpatient Medications Prior to Visit  Medication Sig Dispense Refill   acetaminophen (TYLENOL) 500 MG tablet Take 1,000 mg by mouth every 6 (six) hours as needed for mild pain.     carboxymethylcellulose (REFRESH PLUS) 0.5 % SOLN Place 1 drop into the right eye daily as needed (dry eye).      docusate sodium (COLACE) 100 MG capsule Take 100 mg by mouth daily as needed for mild constipation (uses once in a while).     esomeprazole (NEXIUM) 40 MG capsule Take 1 capsule (40 mg total) by mouth daily. 90 capsule 3   PARoxetine (PAXIL) 40 MG tablet Take 1 tablet (40 mg total) by mouth daily. 90 tablet 1   traZODone (DESYREL) 50 MG tablet Take 1/2 to one tab as needed for insomnia. 20 tablet 0   levETIRAcetam (KEPPRA) 1000 MG tablet Take 1 tablet (1,000 mg total) by mouth 2 (two) times daily. 60 tablet 1   phenytoin (DILANTIN) 100 MG ER capsule Take 1 capsule (100 mg total) by mouth 3 (three) times daily. 90 capsule 1   dexamethasone (DECADRON) 2 MG tablet Take 2 tabs by mouth 3 times a day for 7 days, then 1 tab 3 times a day x7 days, then 1 tab twice a day  x4 days, 1/2 tab twice a day x 2 days, 1/2 tab once daily, then stop. 169 tablet 0   No facility-administered medications prior  to visit.    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   Depression    GERD (gastroesophageal reflux disease)    Panic attack     PAST SURGICAL HISTORY: Past Surgical History:  Procedure Laterality Date   ABDOMINAL HYSTERECTOMY     CHOLECYSTECTOMY N/A 11/21/2017   Procedure: LAPAROSCOPIC CHOLECYSTECTOMY WITH INTRAOPERATIVE CHOLANGIOGRAM;  Surgeon: Jovita Kussmaul, MD;  Location: [facility];  Service: General;  Laterality: N/A;   MULTIPLE EXTRACTIONS WITH ALVEOLOPLASTY N/A 08/28/2019   Procedure: Extraction of tooth #'s 3-7, 10-15, and 20-31 with alveoloplatsy, maxillary right buccal exostosis reduction, and bilateral  mandibular tori reductions.;  Surgeon: Lenn Cal, DDS;  Location: [place];  Service: Oral Surgery;  Laterality: N/A;   PAROTIDECTOMY Right 07/22/2019   with biopsy; done at [facility] by Dr. Fredricka Bonine    FAMILY HISTORY: Family History  Problem Relation Age of Onset   Depression Sister    Heart disease Mother    Diabetes Father    Breast cancer Paternal Grandmother    Colon cancer Neg Hx    Pancreatic cancer Neg Hx    Esophageal cancer Neg Hx    Stomach cancer Neg Hx    Rectal cancer Neg Hx    Liver cancer Neg Hx     SOCIAL HISTORY: Social History   Socioeconomic History   Marital status: Married    Spouse name: Not on file   Number of children: 4   Years of education: Not on file   Highest education level: Not on file  Occupational History   Not on file  Tobacco Use   Smoking status: Every Day    Packs/day: 0.50    Years: 42.00    Pack years: 21.00    Types: Cigarettes    Start date: 61   Smokeless tobacco: Never  Vaping Use   Vaping Use: Never used  Substance and Sexual Activity   Alcohol use: No    Alcohol/week: 0.0 standard drinks   Drug use: No   Sexual activity: Yes    Birth control/protection: None  Other Topics Concern   Not on file  Social History Narrative   Not on file   Social Determinants of Health   Financial Resource Strain: Not on file  Food Insecurity: Not on file  Transportation Needs: Not on file  Physical Activity: Not on file  Stress: Not on file  Social Connections: Not on file  Intimate Partner Violence: Not on file     PHYSICAL EXAM  GENERAL EXAM/CONSTITUTIONAL: Vitals:  Vitals:   12/27/20 0803  BP: 113/67  Pulse: 73  Weight: 142 lb (64.4 kg)  Height: '5\' 4"'$  (1.626 m)   Body mass index is 24.37 kg/m. Wt Readings from Last 3 Encounters:  12/27/20 142 lb (64.4 kg)  11/21/20 158 lb 11.7 oz (72 kg)  05/28/20 151 lb (68.5 kg)   Patient is in no distress; well developed, nourished and groomed; neck is  supple  CARDIOVASCULAR: Examination of carotid arteries is normal; no carotid bruits Regular rate and rhythm, no murmurs Examination of peripheral vascular system by observation and palpation is normal  EYES: Pupils round and reactive to light, Visual fields full to confrontation, Extraocular movements intacts,   MUSCULOSKELETAL: Gait, strength, tone, movements noted in Neurologic exam below  NEUROLOGIC: MENTAL STATUS:  awake, alert, oriented to person, place and time Forgetfull from recent hospitalization.  normal attention and concentration language fluent, comprehension intact,  naming intact fund of knowledge appropriate  CRANIAL NERVE:  2nd, 3rd, 4th, 6th - pupils equal and reactive to light, visual fields full to confrontation, extraocular muscles intact, no nystagmus 5th - facial sensation symmetric 7th - There is right facial nerve weakness  8th - hearing intact 9th - palate elevates symmetrically, uvula midline 11th - shoulder shrug symmetric 12th - tongue protrusion midline  MOTOR:  normal bulk and tone, full strength in the BUE, BLE  SENSORY:  normal and symmetric to light touch, pinprick, temperature, vibration  COORDINATION:  finger-nose-finger, fine finger movements normal  REFLEXES:  deep tendon reflexes present and symmetric  GAIT/STATION:  normal   DIAGNOSTIC DATA (LABS, IMAGING, TESTING) - I reviewed patient records, labs, notes, testing and imaging myself where available.  Lab Results  Component Value Date   WBC 6.3 11/22/2020   HGB 14.9 11/22/2020   HCT 44.7 11/22/2020   MCV 90.3 11/22/2020   PLT 243 11/22/2020      Component Value Date/Time   NA 137 11/22/2020 0101   NA 140 07/23/2017 0820   K 4.3 11/22/2020 0101   CL 99 11/22/2020 0101   CO2 31 11/22/2020 0101   GLUCOSE 117 (H) 11/22/2020 0101   BUN 10 11/22/2020 0101   BUN 11 07/23/2017 0820   CREATININE 0.73 11/22/2020 0101   CALCIUM 9.6 11/22/2020 0101   PROT 7.2 11/22/2020  0101   PROT 7.0 07/23/2017 0820   ALBUMIN 3.9 11/22/2020 0101   ALBUMIN 4.4 07/23/2017 0820   AST 27 11/22/2020 0101   ALT 33 11/22/2020 0101   ALKPHOS 73 11/22/2020 0101   BILITOT 0.4 11/22/2020 0101   BILITOT 0.2 07/23/2017 0820   GFRNONAA >60 11/22/2020 0101   GFRAA >60 08/26/2019 0830   No results found for: CHOL, HDL, LDLCALC, LDLDIRECT, TRIG, CHOLHDL Lab Results  Component Value Date   HGBA1C 5.8 (H) 11/17/2020   No results found for: [id number] Lab Results  Component Value Date   TSH 0.934 07/23/2017    I personally reviewed Brain images and EEG.    ASSESSMENT AND PLAN  60 y.o. 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  However she reports side effect of being sleepy throughout the day sometimes associated nausea and also increased anxiety.  Because of the side effect of being sleepy throughout the day I will decrease her Keppra to 750 mg twice a day and continue on phenytoin 100 mg a day. I will also order repeat MRI brain and now that neurosurgery wanted it follow-up.  She has not seen neurosurgery yet.  We will also obtain a antiseizure medications level today for baseline and see her in 3 months.  Recommended against driving for at least 6 months.  Patient also requested that we complete her disability form.  We will complete as soon as possible.  Review MRI images with patient and husband, all questions/concerns addressed.    1. Seizure ([place])   2. Cancer of parotid gland ([place])      PLAN: Decrease  Keppra to 750  mg twice a day  Continue with Dilantin '100mg'$  three times a day  MRI Brain with and without contrast  Will check Keppra and Dilantin level today  Return to clinic in 3 months    Per [facility] statutes, patients with seizures are not allowed to drive until they have been seizure-free for six months.  Other recommendations include using caution when using heavy equipment or power tools. Avoid working on ladders or at heights. Take showers instead of baths.  Do not swim alone.  Ensure the water temperature is not too high on the home water heater. Do not go swimming alone. Do not lock yourself in a room alone (i.e. bathroom). When caring for infants or small children, sit down when holding, feeding, or changing them to minimize risk of injury to the child in the event you have a seizure. Maintain good sleep hygiene. Avoid alcohol.  Also recommend adequate sleep, hydration, good diet and minimize stress.   Orders Placed This Encounter  Procedures   MR BRAIN W WO CONTRAST   Levetiracetam level   Phenytoin Level, Total     Meds ordered this encounter  Medications   levETIRAcetam (KEPPRA) 750 MG tablet    Sig: Take 1 tablet (750 mg total) by mouth 2 (two) times daily.    Dispense:  120 tablet    Refill:  11   phenytoin (DILANTIN) 100 MG ER capsule    Sig: Take 1 capsule (100 mg total) by mouth 3 (three) times daily.    Dispense:  90 capsule    Refill:  1     Return in about 3 months (around 03/29/2021).    Alric Ran, MD 12/27/2020, 9:08 AM  [facility] [address], [place], [place] [address] [phone]

## 2020-12-27 NOTE — Patient Instructions (Addendum)
Decrease Keppra to 750 mg twice a day  Continue with Dilantin '100mg'$  three times a day  MRI Brain with and without contrast  Will check Keppra and Dilantin level today  Return to clinic in 3 months    Per [facility] statutes, patients with seizures are not allowed to drive until they have been seizure-free for six months.  Other recommendations include using caution when using heavy equipment or power tools. Avoid working on ladders or at heights. Take showers instead of baths.  Do not swim alone.  Ensure the water temperature is not too high on the home water heater. Do not go swimming alone. Do not lock yourself in a room alone (i.e. bathroom). When caring for infants or small children, sit down when holding, feeding, or changing them to minimize risk of injury to the child in the event you have a seizure. Maintain good sleep hygiene. Avoid alcohol.  Also recommend adequate sleep, hydration, good diet and minimize stress.   During the Seizure  - First, ensure adequate ventilation and place patients on the floor on their left side  Loosen clothing around the neck and ensure the airway is patent. If the patient is clenching the teeth, do not force the mouth open with any object as this can cause severe damage - Remove all items from the surrounding that can be hazardous. The patient may be oblivious to what's happening and may not even know what he or she is doing. If the patient is confused and wandering, either gently guide him/her away and block access to outside areas - Reassure the individual and be comforting - Call 911. In most cases, the seizure ends before EMS arrives. However, there are cases when seizures may last over 3 to 5 minutes. Or the individual may have developed breathing difficulties or severe injuries. If a pregnant patient or a person with diabetes develops a seizure, it is prudent to call an ambulance. - Finally, if the patient does not regain full consciousness, then  call EMS. Most patients will remain confused for about 45 to 90 minutes after a seizure, so you must use judgment in calling for help. - Avoid restraints but make sure the patient is in a bed with padded side rails - Place the individual in a lateral position with the neck slightly flexed; this will help the saliva drain from the mouth and prevent the tongue from falling backward - Remove all nearby furniture and other hazards from the area - Provide verbal assurance as the individual is regaining consciousness - Provide the patient with privacy if possible - Call for help and start treatment as ordered by the caregiver   After the Seizure (Postictal Stage)  After a seizure, most patients experience confusion, fatigue, muscle pain and/or a headache. Thus, one should permit the individual to sleep. For the next few days, reassurance is essential. Being calm and helping reorient the person is also of importance.  Most seizures are painless and end spontaneously. Seizures are not harmful to others but can lead to complications such as stress on the lungs, brain and the heart. Individuals with prior lung problems may develop labored breathing and respiratory distress.

## 2020-12-28 ENCOUNTER — Telehealth: Payer: Self-pay | Admitting: Neurology

## 2020-12-28 NOTE — Telephone Encounter (Signed)
[facility] pending faxed clinical notes. Fax # [phone] ph # [phone].

## 2020-12-29 ENCOUNTER — Telehealth: Payer: Self-pay | Admitting: Neurology

## 2020-12-29 NOTE — Telephone Encounter (Signed)
Spoke to the patient, advise her to continue with Keppra '750mg'$  BID and to take Dilantin at night ('300mg'$  at night) instead of 100 TID. Ask the patient to contact us is symptoms, anxiety is still present.

## 2020-12-29 NOTE — Telephone Encounter (Signed)
Pt feels because of her phenytoin (DILANTIN) 100 MG ER capsule she is having bad anxiety, loss of appetite & headaches. Please call pt to discuss.

## 2020-12-30 ENCOUNTER — Other Ambulatory Visit: Payer: Self-pay

## 2020-12-30 ENCOUNTER — Inpatient Hospital Stay (HOSPITAL_COMMUNITY)
Admission: EM | Admit: 2020-12-30 | Discharge: 2021-01-02 | DRG: 100 | Disposition: A | Discharge status: Home / Self Care | Payer: PRIVATE HEALTH INSURANCE | Attending: Internal Medicine | Admitting: Internal Medicine

## 2020-12-30 ENCOUNTER — Emergency Department (HOSPITAL_COMMUNITY): Payer: PRIVATE HEALTH INSURANCE

## 2020-12-30 ENCOUNTER — Encounter (HOSPITAL_COMMUNITY): Payer: Self-pay

## 2020-12-30 DIAGNOSIS — G40109 Localization-related (focal) (partial) symptomatic epilepsy and epileptic syndromes with simple partial seizures, not intractable, without status epilepticus: Principal | ICD-10-CM | POA: Diagnosis present

## 2020-12-30 DIAGNOSIS — R569 Unspecified convulsions: Secondary | ICD-10-CM

## 2020-12-30 DIAGNOSIS — Z880 Allergy status to penicillin: Secondary | ICD-10-CM

## 2020-12-30 DIAGNOSIS — Z20822 Contact with and (suspected) exposure to covid-19: Secondary | ICD-10-CM | POA: Diagnosis present

## 2020-12-30 DIAGNOSIS — K219 Gastro-esophageal reflux disease without esophagitis: Secondary | ICD-10-CM | POA: Diagnosis present

## 2020-12-30 DIAGNOSIS — D72819 Decreased white blood cell count, unspecified: Secondary | ICD-10-CM | POA: Diagnosis present

## 2020-12-30 DIAGNOSIS — F17210 Nicotine dependence, cigarettes, uncomplicated: Secondary | ICD-10-CM | POA: Diagnosis present

## 2020-12-30 DIAGNOSIS — G939 Disorder of brain, unspecified: Secondary | ICD-10-CM | POA: Diagnosis not present

## 2020-12-30 DIAGNOSIS — F32A Depression, unspecified: Secondary | ICD-10-CM | POA: Diagnosis present

## 2020-12-30 DIAGNOSIS — C799 Secondary malignant neoplasm of unspecified site: Secondary | ICD-10-CM | POA: Diagnosis present

## 2020-12-30 DIAGNOSIS — T66XXXA Radiation sickness, unspecified, initial encounter: Secondary | ICD-10-CM | POA: Diagnosis present

## 2020-12-30 DIAGNOSIS — Z923 Personal history of irradiation: Secondary | ICD-10-CM

## 2020-12-30 DIAGNOSIS — Z803 Family history of malignant neoplasm of breast: Secondary | ICD-10-CM

## 2020-12-30 DIAGNOSIS — Z833 Family history of diabetes mellitus: Secondary | ICD-10-CM

## 2020-12-30 DIAGNOSIS — G936 Cerebral edema: Secondary | ICD-10-CM | POA: Diagnosis present

## 2020-12-30 DIAGNOSIS — Z79899 Other long term (current) drug therapy: Secondary | ICD-10-CM

## 2020-12-30 DIAGNOSIS — Z85818 Personal history of malignant neoplasm of other sites of lip, oral cavity, and pharynx: Secondary | ICD-10-CM

## 2020-12-30 DIAGNOSIS — F419 Anxiety disorder, unspecified: Secondary | ICD-10-CM | POA: Diagnosis present

## 2020-12-30 DIAGNOSIS — G9389 Other specified disorders of brain: Secondary | ICD-10-CM | POA: Diagnosis present

## 2020-12-30 DIAGNOSIS — Z8249 Family history of ischemic heart disease and other diseases of the circulatory system: Secondary | ICD-10-CM

## 2020-12-30 DIAGNOSIS — Z90710 Acquired absence of both cervix and uterus: Secondary | ICD-10-CM

## 2020-12-30 DIAGNOSIS — Z885 Allergy status to narcotic agent status: Secondary | ICD-10-CM

## 2020-12-30 LAB — TSH: TSH: 0.969 u[IU]/mL (ref 0.350–4.500)

## 2020-12-30 LAB — CBC WITH DIFFERENTIAL/PLATELET
Abs Immature Granulocytes: 0.01 10*3/uL (ref 0.00–0.07)
Basophils Absolute: 0.1 10*3/uL (ref 0.0–0.1)
Basophils Relative: 1 %
Eosinophils Absolute: 0 10*3/uL (ref 0.0–0.5)
Eosinophils Relative: 1 %
HCT: 38.2 % (ref 36.0–46.0)
Hemoglobin: 12.5 g/dL (ref 12.0–15.0)
Immature Granulocytes: 0 %
Lymphocytes Relative: 32 %
Lymphs Abs: 1.2 10*3/uL (ref 0.7–4.0)
MCH: 30.3 pg (ref 26.0–34.0)
MCHC: 32.7 g/dL (ref 30.0–36.0)
MCV: 92.7 fL (ref 80.0–100.0)
Monocytes Absolute: 0.4 10*3/uL (ref 0.1–1.0)
Monocytes Relative: 11 %
Neutro Abs: 2.1 10*3/uL (ref 1.7–7.7)
Neutrophils Relative %: 55 %
Platelets: 248 10*3/uL (ref 150–400)
RBC: 4.12 MIL/uL (ref 3.87–5.11)
RDW: 13.3 % (ref 11.5–15.5)
WBC: 3.8 10*3/uL — ABNORMAL LOW (ref 4.0–10.5)
nRBC: 0 % (ref 0.0–0.2)

## 2020-12-30 LAB — RESP PANEL BY RT-PCR (FLU A&B, COVID) ARPGX2
Influenza A by PCR: NEGATIVE
Influenza B by PCR: NEGATIVE
SARS Coronavirus 2 by RT PCR: NEGATIVE

## 2020-12-30 LAB — COMPREHENSIVE METABOLIC PANEL
ALT: 33 U/L (ref 0–44)
AST: 26 U/L (ref 15–41)
Albumin: 3.6 g/dL (ref 3.5–5.0)
Alkaline Phosphatase: 86 U/L (ref 38–126)
Anion gap: 9 (ref 5–15)
BUN: 7 mg/dL (ref 6–20)
CO2: 27 mmol/L (ref 22–32)
Calcium: 9.1 mg/dL (ref 8.9–10.3)
Chloride: 103 mmol/L (ref 98–111)
Creatinine, Ser: 0.49 mg/dL (ref 0.44–1.00)
GFR, Estimated: >60 mL/min (ref >60)
Glucose, Bld: 84 mg/dL (ref 70–99)
Potassium: 3.7 mmol/L (ref 3.5–5.1)
Sodium: 139 mmol/L (ref 135–145)
Total Bilirubin: 0.2 mg/dL — ABNORMAL LOW (ref 0.3–1.2)
Total Protein: 6.3 g/dL — ABNORMAL LOW (ref 6.5–8.1)

## 2020-12-30 LAB — PHENYTOIN LEVEL, TOTAL: Phenytoin Lvl: 9.1 ug/mL — ABNORMAL LOW (ref 10.0–20.0)

## 2020-12-30 MED ORDER — DEXAMETHASONE SODIUM PHOSPHATE 10 MG/ML IJ SOLN
10.0000 mg | Freq: Once | INTRAMUSCULAR | Status: AC
  Administered 2020-12-30: 10 mg via INTRAVENOUS
  Filled 2020-12-30: qty 1

## 2020-12-30 MED ORDER — SODIUM CHLORIDE 0.9 % IV SOLN
750.0000 mg | Freq: Two times a day (BID) | INTRAVENOUS | Status: DC
  Administered 2020-12-30 – 2021-01-02 (×6): 750 mg via INTRAVENOUS
  Filled 2020-12-30 (×9): qty 7.5

## 2020-12-30 MED ORDER — PHENYTOIN SODIUM EXTENDED 100 MG PO CAPS: 100.0000 mg | ORAL_CAPSULE | Freq: Three times a day (TID) | ORAL | Status: DC

## 2020-12-30 MED ORDER — DEXAMETHASONE 4 MG PO TABS
4.0000 mg | ORAL_TABLET | Freq: Four times a day (QID) | ORAL | Status: DC
  Administered 2020-12-31 – 2021-01-02 (×9): 4 mg via ORAL
  Filled 2020-12-30 (×9): qty 1

## 2020-12-30 MED ORDER — PAROXETINE HCL 20 MG PO TABS
40.0000 mg | ORAL_TABLET | Freq: Every day | ORAL | Status: DC
  Administered 2020-12-31 – 2021-01-02 (×3): 40 mg via ORAL
  Filled 2020-12-30 (×4): qty 2

## 2020-12-30 MED ORDER — ENOXAPARIN SODIUM 40 MG/0.4ML IJ SOSY: 40.0000 mg | PREFILLED_SYRINGE | Freq: Every day | INTRAMUSCULAR | Status: DC

## 2020-12-30 MED ORDER — POLYVINYL ALCOHOL 1.4 % OP SOLN: 1.0000 [drp] | Freq: Every day | OPHTHALMIC | Status: DC | PRN

## 2020-12-30 MED ORDER — GADOBUTROL 1 MMOL/ML IV SOLN
6.0000 mL | Freq: Once | INTRAVENOUS | Status: AC | PRN
  Administered 2020-12-30: 6 mL via INTRAVENOUS

## 2020-12-30 MED ORDER — SODIUM CHLORIDE 0.9% FLUSH
3.0000 mL | Freq: Two times a day (BID) | INTRAVENOUS | Status: DC
  Administered 2020-12-30 – 2021-01-02 (×6): 3 mL via INTRAVENOUS

## 2020-12-30 MED ORDER — PANTOPRAZOLE SODIUM 40 MG PO TBEC
40.0000 mg | DELAYED_RELEASE_TABLET | Freq: Every day | ORAL | Status: DC
  Administered 2020-12-31 – 2021-01-02 (×3): 40 mg via ORAL
  Filled 2020-12-30 (×3): qty 1

## 2020-12-30 MED ORDER — PHENYTOIN SODIUM EXTENDED 100 MG PO CAPS
300.0000 mg | ORAL_CAPSULE | Freq: Once | ORAL | Status: AC
  Administered 2020-12-30: 300 mg via ORAL
  Filled 2020-12-30: qty 3

## 2020-12-30 MED ORDER — ACETAMINOPHEN 650 MG RE SUPP: 650.0000 mg | Freq: Four times a day (QID) | RECTAL | Status: DC | PRN

## 2020-12-30 MED ORDER — ACETAMINOPHEN 325 MG PO TABS: 650.0000 mg | ORAL_TABLET | Freq: Four times a day (QID) | ORAL | Status: DC | PRN

## 2020-12-30 MED ORDER — POLYETHYLENE GLYCOL 3350 17 G PO PACK: 17.0000 g | PACK | Freq: Every day | ORAL | Status: DC | PRN

## 2020-12-30 NOTE — ED Provider Notes (Signed)
[place] EMERGENCY DEPARTMENT Provider Note   CSN: [id number] Arrival date & time: 12/30/20  1258     History Chief Complaint  Patient presents with   Panic Attack    Melissa Case is a 59 y.o. 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  She states that she has been having episodes worsening over the past 4 days when she feels a abnormal sensation in her body and get goosebumps.  She states that sometimes these are back-to-back and will last about 15 seconds, and sometimes are less frequent and last longer.  She reports compliance with all of her medications.  She was seen by neurology on 8/8 and they decreased her Keppra dose from 1 g down to 750 and switched her Dilantin to at night.  He denies any fevers.  She does have a remote history of anxiety, however states that she has been stable on Paxil for the past 10 years.  She states that these feel different than when she had an anxiety in the past.  She does not have any palpitations or abnormal sensations in her chest during these events.  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[place]) 08/20/2019   Acute calculous cholecystitis 11/21/2017   Abdominal pain 09/30/2013   SBO (small bowel obstruction) ([place]) 09/30/2013   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/05/2012   Gall stone 04/05/2012   Depression 09/15/2011    Past Surgical History:  Procedure Laterality Date   ABDOMINAL HYSTERECTOMY     CHOLECYSTECTOMY N/A 11/21/2017   Procedure: LAPAROSCOPIC CHOLECYSTECTOMY WITH INTRAOPERATIVE CHOLANGIOGRAM;  Surgeon: Jovita Kussmaul, MD;   Location: [facility];  Service: General;  Laterality: N/A;   MULTIPLE EXTRACTIONS WITH ALVEOLOPLASTY N/A 08/28/2019   Procedure: Extraction of tooth #'s 3-7, 10-15, and 20-31 with alveoloplatsy, maxillary right buccal exostosis reduction, and bilateral mandibular tori reductions.;  Surgeon: Lenn Cal, DDS;  Location: [place];  Service: Oral Surgery;  Laterality: N/A;   PAROTIDECTOMY Right 07/22/2019   with biopsy; done at [facility] by Dr. Fredricka Bonine     OB History   No obstetric history on file.     Family History  Problem Relation Age of Onset   Depression Sister    Heart disease Mother    Diabetes Father    Breast cancer Paternal Grandmother    Colon cancer Neg Hx    Pancreatic cancer Neg Hx    Esophageal cancer Neg Hx    Stomach cancer Neg Hx    Rectal cancer Neg Hx    Liver cancer Neg Hx     Social History   Tobacco Use   Smoking status: Every Day    Packs/day: 0.50    Years: 42.00    Pack years: 21.00    Types: Cigarettes    Start date: 1978   Smokeless tobacco: Never  Vaping Use   Vaping Use: Never used  Substance Use Topics   Alcohol use: No    Alcohol/week: 0.0 standard drinks   Drug use: No    Home  Medications Prior to Admission medications   Medication Sig Start Date End Date Taking? Authorizing Provider  acetaminophen (TYLENOL) 500 MG tablet Take 1,000 mg by mouth every 6 (six) hours as needed for mild pain.    [provider]  carboxymethylcellulose (REFRESH PLUS) 0.5 % SOLN Place 1 drop into the right eye daily as needed (dry eye).     [provider]  docusate sodium (COLACE) 100 MG capsule Take 100 mg by mouth daily as needed for mild constipation (uses once in a while).    [provider]  esomeprazole (NEXIUM) 40 MG capsule Take 1 capsule (40 mg total) by mouth daily. 05/28/20   Thornton Park, MD  levETIRAcetam (KEPPRA) 750 MG tablet Take 1 tablet (750 mg total) by mouth 2 (two) times daily. 12/27/20   Alric Ran, MD  PARoxetine (PAXIL) 40 MG tablet Take 1 tablet (40 mg total) by mouth daily. 10/26/20   Arfeen, Arlyce Harman, MD  phenytoin (DILANTIN) 100 MG ER capsule Take 1 capsule (100 mg total) by mouth 3 (three) times daily. 12/27/20   Alric Ran, MD  traZODone (DESYREL) 50 MG tablet Take 1/2 to one tab as needed for insomnia. 07/21/19   Arfeen, Arlyce Harman, MD    Allergies    Morphine and Penicillins  Review of Systems   Review of Systems  Constitutional:  Positive for fatigue. Negative for chills and fever.  Respiratory:  Negative for chest tightness and shortness of breath.   Cardiovascular:  Negative for chest pain and palpitations.  Gastrointestinal:  Negative for abdominal pain.  Musculoskeletal:  Negative for back pain and neck pain.  Skin:  Negative for color change.  Neurological:  Negative for speech difficulty (at baseline).       Episodes of goosebumps,   Psychiatric/Behavioral:  The patient is nervous/anxious.   All other systems reviewed and are negative.  Physical Exam Updated Vital Signs BP 118/82   Pulse 77   Temp 98.3 F (36.8 C) (Oral)   Resp 16   Ht '5\' 4"'$  (1.626 m)   Wt 63.5 kg   SpO2 95%   BMI 24.03 kg/m   Physical Exam Vitals and nursing note reviewed.  Constitutional:      General: She is not in acute distress.    Appearance: She is not diaphoretic.  HENT:     Head:     Comments: No racoon eyes, left eyelid slightly swollen at baseline due to reported gold weight in eye lid.  Eyes:     General: No scleral icterus.       Right eye: No discharge.        Left eye: No discharge.     Conjunctiva/sclera: Conjunctivae normal.  Cardiovascular:     Rate and Rhythm: Normal rate and regular rhythm.     Pulses: Normal pulses.     Heart sounds: Normal heart sounds.  Pulmonary:     Effort: Pulmonary effort is normal. No respiratory distress.     Breath sounds: No stridor.  Abdominal:     General: There is no distension.     Tenderness: There is no abdominal  tenderness.  Musculoskeletal:        General: No deformity.     Cervical back: Normal range of motion and neck supple. No rigidity.  Skin:    General: Skin is warm and dry.  Neurological:     Mental Status: She is alert.     Motor: No abnormal muscle tone.  Comments: Patient is awake and alert.  Answers questions appropriately.  She does not appear to have any twitching or jerking movements.  She does not appear to have lapses in consciousness. She did have one of her episodes while I was in the room, during which it was noted that at least on her right arm and the right side of her chest she did have goosebumps during the about 15 seconds she stated she was feeling like she was having anxiety.    Psychiatric:     Comments: Patient describes her episode as anxiety.     ED Results / Procedures / Treatments   Labs (all labs ordered are listed, but only abnormal results are displayed) Labs Reviewed  PHENYTOIN LEVEL, TOTAL - Abnormal; Notable for the following components:      Result Value   Phenytoin Lvl 9.1 (*)    All other components within normal limits  CBC WITH DIFFERENTIAL/PLATELET - Abnormal; Notable for the following components:   WBC 3.8 (*)    All other components within normal limits  RESP PANEL BY RT-PCR (FLU A&B, COVID) [id number]  LEVETIRACETAM LEVEL  COMPREHENSIVE METABOLIC PANEL  TSH    EKG EKG Interpretation  Date/Time:  Thursday December 30 2020 19:01:55 EDT Ventricular Rate:  75 PR Interval:  185 QRS Duration: 92 QT Interval:  385 QTC Calculation: 430 R Axis:   12 Text Interpretation: Sinus rhythm RSR' in V1 or V2, probably normal variant No significant change since last tracing Confirmed by Wandra Arthurs [phone]) on 12/30/2020 7:04:14 PM  Radiology CT HEAD WO CONTRAST (5MM)  Result Date: 12/30/2020 CLINICAL DATA:  Seizure, nontraumatic. EXAM: CT HEAD WITHOUT CONTRAST TECHNIQUE: Contiguous axial images were obtained from the base of the skull through the  vertex without intravenous contrast. COMPARISON:  11/18/2020.  11/17/2020.  06/25/2019. FINDINGS: Brain: No abnormality seen affecting the brainstem or cerebellum. Left cerebral hemisphere is normal. There is artifact relating to some sort of ocular or eyelid implant. There is persistent vasogenic edema within the right temporal lobe, very similar to the study 5-6 weeks ago. Some parenchymal calcification remains visible. Again the differential diagnosis is that of metastatic disease versus is radiation necrosis. No hydrocephalus. No extra-axial fluid collection. Vascular: There is atherosclerotic calcification of the major vessels at the base of the brain. Skull: Negative Sinuses/Orbits: Clear other than a retention cyst of the right maxillary sinus. Orbital or ocular implant on the right as noted above. Other: Previous right parotidectomy. IMPRESSION: Similar appearance to the study of 5-6 weeks ago. Right temporal lobe edema with some areas of calcification. The differential diagnosis is metastatic disease versus radiation necrosis, with radiation necrosis being favored. Electronically Signed   By: Nelson Chimes M.D.   On: 12/30/2020 19:25    Procedures Procedures   Medications Ordered in ED Medications - No data to display  ED Course  I have reviewed the triage vital signs and the nursing notes.  Pertinent labs & imaging results that were available during my care of the patient were reviewed by me and considered in my medical decision making (see chart for details).    MDM Rules/Calculators/A&P                           Patient is a 60 year old woman who presents today for evaluation of possible anxiety.  She is recently been diagnosed with new onset seizures and reports compliance with her medications.  Here her phenytoin is slightly  subtherapeutic.  She presents today as she has been having these episodes that she describes as anxiety however these appear atypical for anxiety.  She had an  episode while I was in the room and had sudden onset of goosebumps lasting for about 15 seconds which was the duration of when she felt anxious.  She was not hyperventilating, was not tachycardic and did not have any outward signs of being anxious.  While patient does have a history of anxiety and that is a possible cause, given that she had temporal lobe abnormalities.  Previous EEG on 6/30 that she had did show that she was having seizures without clinical signs from the right temporoparietal region averaging about 2 to 3/h lasting 15 to 45 seconds each.  This duration sounds consistent with the events that patient is reporting, and I question if these may be atypical seizures, especially given the sudden onset and resolution of the goosebumps during these episodes.    Will obtain labs, MRI.    At shift change care was transferred to Dr. Darl Householder who will follow pending studies, re-evaulate and determine disposition.    Note: Portions of this report may have been transcribed using voice recognition software. Every effort was made to ensure accuracy; however, inadvertent computerized transcription errors may be present   Final Clinical Impression(s) / ED Diagnoses Final diagnoses:  None    Rx / DC Orders ED Discharge Orders     None        Ollen Gross 12/30/20 1936    Drenda Freeze, MD 12/30/20 2306

## 2020-12-30 NOTE — ED Triage Notes (Signed)
Pt states that she was recently prescribed dilantin and keppra for new onset of seizures (June 29) and is now having increasing anxiety and panic attacks she believes are related to the new medications. Denies any pain, SOB during triage.

## 2020-12-30 NOTE — ED Provider Notes (Signed)
Emergency Medicine Provider Triage Evaluation Note  Melissa Case , a 60 y.o. female  was evaluated in triage.  Pt complains of increased anxiety and panic attacks.  Patient believes that these are attributed to her Dilantin and Keppra medication which she was started on June 4 seizures.  Patient saw neurologist on 8/8 who decreased her Keppra dose and changed Dilantin to take nightly.  Patient has appointment with her psychiatrist to discuss her anxiety and the Paxil medication she currently takes.  Patient endorses shortness of breath with anxiety attacks.  Review of Systems  Positive: Anxiety Negative: SI, HI, AVH, chest pain  Physical Exam  BP 122/77 (BP Location: Right Arm)   Pulse 86   Temp 98.3 F (36.8 C) (Oral)   Resp 16   Ht '5\' 4"'$  (1.626 m)   Wt 63.5 kg   SpO2 95%   BMI 24.03 kg/m  Gen:   Awake, no distress   Resp:  Normal effort, lungs clear to auscultation bilaterally MSK:   Moves extremities without difficulty  Other:    Medical Decision Making  Medically screening exam initiated at 2:10 PM.  Appropriate orders placed.  [name] was informed that the remainder of the evaluation will be completed by another provider, this initial triage assessment does not replace that evaluation, and the importance of remaining in the ED until their evaluation is complete.  The patient appears stable so that the remainder of the work up may be completed by another provider.      Loni Beckwith, PA-C 12/30/20 1411    Truddie Hidden, MD 12/30/20 [phone]

## 2020-12-30 NOTE — H&P (Signed)
History and Physical   Melissa Case JYN:[id number] DOB: 25-Jun-1960 DOA: 12/30/2020  PCP: Melissa Brooklyn, DO   Patient coming from: Home  Chief Complaint: Abnormal sensation  HPI: Melissa Case is a 60 y.o. 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 Patient states she has been experiencing these abnormal sensations in her body for the last 4 days.  Seems kind of like tremor.  She gets goosebumps when it occurs and lasts for around 15 seconds.  Sometimes episodes occur back-to-back.  She does have associated anxiety during the episodes. Of note she is recently admitted for atypical seizure which was proven with EEG and found to have an MRI scan showing an enhancing lesion with associated vasogenic edema most consistent with postradiation necrosis.  Was evaluated by neurology and neurosurgery at that time neurosurgery plan for repeat MRI and was started on antiepileptics.  Concerned that this presentation may be breakthrough atypical seizures.  Patient denies fevers, chest pain, shortness of breath, abdominal pain, constipation, diarrhea, nausea, vomiting.  ED Course: Vital signs in the ED are stable.  Lab work-up showed CMP significant only for protein 6.3.  CBC showed white count of 3.8.  TSH normal.  Phenytoin level mildly low and Keppra level pending.  Imaging in the ED included a CT of the head which showed similar changes to 2 weeks ago with a right pleural enhancement and some areas of calcification favoring radiation necrosis versus metastatic disease.  MR brain was also repeated which showed mild increase in size of the enhancing lesion to 0.8 x 1.5 x 2.5 up from 0.8 x 1.4 x 2.4 during previous admission.  There is also associated vasogenic edema once again demonstrated which is more significant than previously.  Patient started on Keppra and  phenytoin in the ED as well as Decadron.  This was in consultation with neurology who was also ordered a video EEG.  Review of Systems: As per HPI otherwise all other systems reviewed and are negative.  Past Medical History:  Diagnosis Date   Cancer (HCC)    Partoid   Depression    GERD (gastroesophageal reflux disease)    Panic attack     Past Surgical History:  Procedure Laterality Date   ABDOMINAL HYSTERECTOMY     CHOLECYSTECTOMY N/A 11/21/2017   Procedure: LAPAROSCOPIC CHOLECYSTECTOMY WITH INTRAOPERATIVE CHOLANGIOGRAM;  Surgeon: Melissa Miner, MD;  Location: [facility];  Service: General;  Laterality: N/A;   MULTIPLE EXTRACTIONS WITH ALVEOLOPLASTY N/A 08/28/2019   Procedure: Extraction of tooth #'s 3-7, 10-15, and 20-31 with alveoloplatsy, maxillary right buccal exostosis reduction, and bilateral mandibular tori reductions.;  Surgeon: Melissa Case, DDS;  Location: [facility] OR;  Service: Oral Surgery;  Laterality: N/A;   PAROTIDECTOMY Right 07/22/2019   with biopsy; done at [facility] by Dr. Wendall Case    Social History  reports that she has been smoking cigarettes. She started smoking about 44 years ago. She has a 21.00 pack-year smoking history. She has never used smokeless tobacco. She reports that she does not drink alcohol and does not use drugs.  Allergies  Allergen Reactions   Morphine Itching   Penicillins Rash    Has patient had a PCN reaction causing immediate rash, facial/tongue/throat swelling, SOB or lightheadedness with hypotension: No Has patient had a PCN reaction causing severe rash involving mucus membranes or skin necrosis: No Has patient had a PCN reaction that required  hospitalization No Has patient had a PCN reaction occurring within the last 10 years: No If all of the above answers are "NO", then may proceed with Cephalosporin use.     Family History  Problem Relation Age of Onset   Depression Sister    Heart disease Mother    Diabetes Father     Breast cancer Paternal Grandmother    Colon cancer Neg Hx    Pancreatic cancer Neg Hx    Esophageal cancer Neg Hx    Stomach cancer Neg Hx    Rectal cancer Neg Hx    Liver cancer Neg Hx   Reviewed on admission  Prior to Admission medications   Medication Sig Start Date End Date Taking? Authorizing Provider  acetaminophen (TYLENOL) 500 MG tablet Take 1,000 mg by mouth every 6 (six) hours as needed for mild pain.    [provider]  carboxymethylcellulose (REFRESH PLUS) 0.5 % SOLN Place 1 drop into the right eye daily as needed (dry eye).     [provider]  docusate sodium (COLACE) 100 MG capsule Take 100 mg by mouth daily as needed for mild constipation (uses once in a while).    [provider]  esomeprazole (NEXIUM) 40 MG capsule Take 1 capsule (40 mg total) by mouth daily. 05/28/20   Tressia Danas, MD  levETIRAcetam (KEPPRA) 750 MG tablet Take 1 tablet (750 mg total) by mouth 2 (two) times daily. 12/27/20   Windell Norfolk, MD  PARoxetine (PAXIL) 40 MG tablet Take 1 tablet (40 mg total) by mouth daily. 10/26/20   Arfeen, Phillips Grout, MD  phenytoin (DILANTIN) 100 MG ER capsule Take 1 capsule (100 mg total) by mouth 3 (three) times daily. 12/27/20   Windell Norfolk, MD  traZODone (DESYREL) 50 MG tablet Take 1/2 to one tab as needed for insomnia. 07/21/19   Cleotis Nipper, MD    Physical Exam: Vitals:   12/30/20 1400 12/30/20 1745 12/30/20 1800 12/30/20 1958  BP: 122/77 115/70 118/82 128/81  Pulse: 86 75 77 71  Resp: 16  16 19   Temp: 98.3 F (36.8 C)   98.5 F (36.9 C)  TempSrc: Oral   Oral  SpO2: 95% 96% 95% 98%  Weight:      Height:       Physical Exam Constitutional:      General: She is not in acute distress.    Appearance: Normal appearance.  HENT:     Head: Normocephalic and atraumatic.     Mouth/Throat:     Mouth: Mucous membranes are moist.     Pharynx: Oropharynx is clear.  Eyes:     Extraocular Movements: Extraocular movements intact.     Pupils:  Pupils are equal, round, and reactive to light.  Cardiovascular:     Rate and Rhythm: Normal rate and regular rhythm.     Pulses: Normal pulses.     Heart sounds: Normal heart sounds.  Pulmonary:     Effort: Pulmonary effort is normal. No respiratory distress.     Breath sounds: Normal breath sounds.  Abdominal:     General: Bowel sounds are normal. There is no distension.     Palpations: Abdomen is soft.     Tenderness: There is no abdominal tenderness.  Musculoskeletal:        General: No swelling or deformity.  Skin:    General: Skin is warm and dry.  Neurological:     General: No focal deficit present.     Mental Status:  Mental status is at baseline.     Comments: Right facial paralysis   Labs on Admission: I have personally reviewed following labs and imaging studies  CBC: Recent Labs  Lab 12/30/20 1735  WBC 3.8*  NEUTROABS 2.1  HGB 12.5  HCT 38.2  MCV 92.7  PLT 248    Basic Metabolic Panel: Recent Labs  Lab 12/30/20 1735  NA 139  K 3.7  CL 103  CO2 27  GLUCOSE 84  BUN 7  CREATININE 0.49  CALCIUM 9.1    GFR: Estimated Creatinine Clearance: 65.4 mL/min (by C-G formula based on SCr of 0.49 mg/dL).  Liver Function Tests: Recent Labs  Lab 12/30/20 1735  AST 26  ALT 33  ALKPHOS 86  BILITOT 0.2*  PROT 6.3*  ALBUMIN 3.6    Urine analysis:    Component Value Date/Time   COLORURINE STRAW (A) 11/18/2020 2309   APPEARANCEUR CLEAR 11/18/2020 2309   LABSPEC 1.025 11/18/2020 2309   PHURINE 7.0 11/18/2020 2309   GLUCOSEU NEGATIVE 11/18/2020 2309   HGBUR NEGATIVE 11/18/2020 2309   BILIRUBINUR NEGATIVE 11/18/2020 2309   KETONESUR NEGATIVE 11/18/2020 2309   PROTEINUR NEGATIVE 11/18/2020 2309   UROBILINOGEN 1.0 02/01/2014 1108   NITRITE NEGATIVE 11/18/2020 2309   LEUKOCYTESUR NEGATIVE 11/18/2020 2309    Radiological Exams on Admission: CT HEAD WO CONTRAST ( )  Result Date: 12/30/2020 CLINICAL DATA:  Seizure, nontraumatic. EXAM: CT HEAD WITHOUT  CONTRAST TECHNIQUE: Contiguous axial images were obtained from the base of the skull through the vertex without intravenous contrast. COMPARISON:  11/18/2020.  11/17/2020.  06/25/2019. FINDINGS: Brain: No abnormality seen affecting the brainstem or cerebellum. Left cerebral hemisphere is normal. There is artifact relating to some sort of ocular or eyelid implant. There is persistent vasogenic edema within the right temporal lobe, very similar to the study 5-6 weeks ago. Some parenchymal calcification remains visible. Again the differential diagnosis is that of metastatic disease versus is radiation necrosis. No hydrocephalus. No extra-axial fluid collection. Vascular: There is atherosclerotic calcification of the major vessels at the base of the brain. Skull: Negative Sinuses/Orbits: Clear other than a retention cyst of the right maxillary sinus. Orbital or ocular implant on the right as noted above. Other: Previous right parotidectomy. IMPRESSION: Similar appearance to the study of 5-6 weeks ago. Right temporal lobe edema with some areas of calcification. The differential diagnosis is metastatic disease versus radiation necrosis, with radiation necrosis being favored. Electronically Signed   By: Paulina Fusi M.D.   On: 12/30/2020 19:25   MR Brain W and Wo Contrast  Result Date: 12/30/2020 CLINICAL DATA:  Initial evaluation for acute seizure. EXAM: MRI HEAD WITHOUT AND WITH CONTRAST TECHNIQUE: Multiplanar, multiecho pulse sequences of the brain and surrounding structures were obtained without and with intravenous contrast. CONTRAST:  6mL GADAVIST GADOBUTROL 1 MMOL/ML IV SOLN COMPARISON:  Head CT from earlier the same day as well as previous MRI from 11/17/2020. FINDINGS: Brain: Previously identified enhancing masslike lesion at the anterior right temporal lobe again seen. This measures 0.8 x 1.5 x 2.5 cm on today's exam, increased in size from previous. Asymmetric enhancement involving the mastoid and tympanic  segments of the right seventh cranial nerve again noted (series 22, image 21). Probable subtle asymmetric enhancement noted along the dura of the inferior right temporal lobe. Persistent associated vasogenic edema within the adjacent right temporal region has also somewhat worsened. Edema involves the adjacent mesial right temporal lobe and right hippocampus. No more than trace right-to-left shift. No other evidence  for acute or subacute infarct. Gray-white matter differentiation otherwise maintained. No other areas of chronic cortical infarction. No other evidence for acute or chronic intracranial hemorrhage. No other mass lesion or mass effect. No other abnormal enhancement. No hydrocephalus or extra-axial fluid collection. Pituitary gland suprasellar region normal. Vascular: Major intracranial vascular flow voids are maintained. Skull and upper cervical spine: Craniocervical junction within normal limits. Bone marrow signal intensity normal. 4 mm enhancing focus at the posterior right occipital calvarium noted, stable from prior (series 22, image 35). Finding is nonspecific, but favored to be benign. No other focal marrow replacing lesion. Sinuses/Orbits: Globes orbital soft tissues demonstrate no acute finding. Right maxillary sinus retention cyst noted. Paranasal sinuses are otherwise largely clear. Right mastoid effusion. Other: Sequelae of prior right parotid resection partially visualized. IMPRESSION: 1. Interval increase in size of enhancing lesion at the anterior right temporal lobe, now measuring 0.8 x 1.5 x 2.5 cm. Associated vasogenic edema within the adjacent right temporal region has also slightly worsened. 2. No other acute intracranial abnormality. 3. Right mastoid effusion. Results were discussed by telephone at the time of interpretation on 12/30/2020 at 10:09 pm to provider Dr. Iver Nestle. Electronically Signed   By: Rise Mu M.D.   On: 12/30/2020 22:19    EKG: Independently reviewed.   Sinus rhythm at 75 bpm.  Assessment/Plan Principal Problem:   Cerebral edema (HCC) Active Problems:   Seizure (HCC)   GERD (gastroesophageal reflux disease)  Cerebral edema Seizure disorder Temporal mass Abnormal sensation in her body with associated groups bumps and tremor lasting around 15 seconds sometimes occurring back-to-back. > Recently minute for atypical seizures in the setting of enhancing lesion anterior temporal lobe suspicious for radiation necrosis with surrounding vasogenic edema.  Started on antiepileptics at the time and a Decadron taper based on evaluation from neurosurgery. > Plan at that time was for repeat MRI per neurosurgery.  They have not yet been consulted this admission and she will benefit from a consult in the morning. > CT head and MRI brain redemonstrated similar changes to last admission however enhancing lesion was a millimeter larger in 2 dimensions and had increased vasogenic edema. > Neurology was consulted and started patient on antiepileptics as well as Decadron given the edema and a video EEG has also been ordered. - Appreciate neurology recommendations - Will benefit from neurosurgery consult in the morning - Monitor on telemetry - Continue antiepileptics per neurology (Keppra and phenytoin) - Continue Decadron - Video EEG  History of parotid cancer in 2021 > History of prior treated mets. > Above seizure disorder and MRI changes being evaluated in this context  GERD - Continue home PPI  Depression and anxiety - Continue home paroxetine  DVT prophylaxis: SCDs  Code Status:   Full  Family Communication:  Husband called by phone however there was no answer. Disposition Plan:   Patient is from:  Home  Anticipated DC to:  Home  Anticipated DC date:  1 to 3 days  Anticipated DC barriers: None  Consults called:  Neurology, Dr. But got consulted by EDP.  Neurosurgery has not been consulted but patient will benefit from a neurosurgery consult  in the morning given they were previously seeing her.   Admission status:  Observation, telemetry   Severity of Illness: The appropriate patient status for this patient is OBSERVATION. Observation status is judged to be reasonable and necessary in order to provide the required intensity of service to ensure the patient's safety. The patient's presenting symptoms, physical  exam findings, and initial radiographic and laboratory data in the context of their medical condition is felt to place them at decreased risk for further clinical deterioration. Furthermore, it is anticipated that the patient will be medically stable for discharge from the hospital within 2 midnights of admission. The following factors support the patient status of observation.   " The patient's presenting symptoms include abnormal sensation including goosebumps and tremor and anxiety. " The physical exam findings include Stable exam with chronic right facial paralysis. " The initial radiographic and laboratory data are Lab work-up showed CMP significant only for protein 6.3.  CBC showed white count of 3.8.  TSH normal.  Phenytoin level mildly low and Keppra level pending.  Imaging in the ED included a CT of the head which showed similar changes to 2 weeks ago with a right pleural enhancement and some areas of calcification favoring radiation necrosis versus metastatic disease.  MR brain was also repeated which showed mild increase in size of the enhancing lesion to 0.8 x 1.5 x 2.5 up from 0.8 x 1.4 x 2.4 during previous admission.  There is also associated vasogenic edema once again demonstrated which is more significant than previously.  Synetta Fail MD [facility]  How to contact the [facility] Attending or Consulting provider 7A - 7P or covering provider during after hours 7P -7A, for this patient?   Check the care team in [facility] and look for a) attending/consulting [facility] provider listed and b) the [facility] team listed Log into  [email or web address] and use [place]'s universal password to access. If you do not have the password, please contact the hospital operator. Locate the [facility] provider you are looking for under [facility] and page to a number that you can be directly reached. If you still have difficulty reaching the provider, please page the [facility] (Director on Call) for the Hospitalists listed on amion for assistance.  12/30/2020, 11:33 PM

## 2020-12-31 ENCOUNTER — Observation Stay (HOSPITAL_COMMUNITY): Payer: PRIVATE HEALTH INSURANCE

## 2020-12-31 ENCOUNTER — Encounter (HOSPITAL_COMMUNITY): Payer: Self-pay | Admitting: Psychiatry

## 2020-12-31 ENCOUNTER — Telehealth (INDEPENDENT_AMBULATORY_CARE_PROVIDER_SITE_OTHER): Payer: No Typology Code available for payment source | Admitting: Psychiatry

## 2020-12-31 DIAGNOSIS — F330 Major depressive disorder, recurrent, mild: Secondary | ICD-10-CM | POA: Diagnosis not present

## 2020-12-31 DIAGNOSIS — F419 Anxiety disorder, unspecified: Secondary | ICD-10-CM | POA: Diagnosis not present

## 2020-12-31 DIAGNOSIS — G936 Cerebral edema: Secondary | ICD-10-CM

## 2020-12-31 DIAGNOSIS — R569 Unspecified convulsions: Secondary | ICD-10-CM | POA: Diagnosis not present

## 2020-12-31 DIAGNOSIS — K219 Gastro-esophageal reflux disease without esophagitis: Secondary | ICD-10-CM | POA: Diagnosis not present

## 2020-12-31 LAB — PHENYTOIN LEVEL, TOTAL: Phenytoin (Dilantin), Serum: 9.2 ug/mL — ABNORMAL LOW (ref 10.0–20.0)

## 2020-12-31 LAB — COMPREHENSIVE METABOLIC PANEL
ALT: 36 U/L (ref 0–44)
AST: 28 U/L (ref 15–41)
Albumin: 3.6 g/dL (ref 3.5–5.0)
Alkaline Phosphatase: 92 U/L (ref 38–126)
Anion gap: 8 (ref 5–15)
BUN: 7 mg/dL (ref 6–20)
CO2: 26 mmol/L (ref 22–32)
Calcium: 9.4 mg/dL (ref 8.9–10.3)
Chloride: 104 mmol/L (ref 98–111)
Creatinine, Ser: 0.53 mg/dL (ref 0.44–1.00)
GFR, Estimated: >60 mL/min (ref >60)
Glucose, Bld: 111 mg/dL — ABNORMAL HIGH (ref 70–99)
Potassium: 4.1 mmol/L (ref 3.5–5.1)
Sodium: 138 mmol/L (ref 135–145)
Total Bilirubin: 0.6 mg/dL (ref 0.3–1.2)
Total Protein: 6.4 g/dL — ABNORMAL LOW (ref 6.5–8.1)

## 2020-12-31 LAB — CBC
HCT: 41.2 % (ref 36.0–46.0)
Hemoglobin: 13.1 g/dL (ref 12.0–15.0)
MCH: 30 pg (ref 26.0–34.0)
MCHC: 31.8 g/dL (ref 30.0–36.0)
MCV: 94.5 fL (ref 80.0–100.0)
Platelets: 257 10*3/uL (ref 150–400)
RBC: 4.36 MIL/uL (ref 3.87–5.11)
RDW: 13.4 % (ref 11.5–15.5)
WBC: 4.4 10*3/uL (ref 4.0–10.5)
nRBC: 0 % (ref 0.0–0.2)

## 2020-12-31 LAB — LEVETIRACETAM LEVEL: Levetiracetam Lvl: 9.7 ug/mL — ABNORMAL LOW (ref 10.0–40.0)

## 2020-12-31 MED ORDER — NICOTINE 21 MG/24HR TD PT24
21.0000 mg | MEDICATED_PATCH | Freq: Once | TRANSDERMAL | Status: AC
  Administered 2020-12-31: 21 mg via TRANSDERMAL
  Filled 2020-12-31: qty 1

## 2020-12-31 MED ORDER — PHENYTOIN SODIUM EXTENDED 30 MG PO CAPS
130.0000 mg | ORAL_CAPSULE | Freq: Three times a day (TID) | ORAL | Status: DC
  Administered 2020-12-31 – 2021-01-02 (×7): 130 mg via ORAL
  Filled 2020-12-31 (×10): qty 1

## 2020-12-31 NOTE — Procedures (Signed)
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  EEG to evaluate for seizures.  Level of alertness: Awake, asleep  AEDs during EEG study: LEV, PHT  Technical aspects: This EEG study was done with scalp electrodes positioned according to the 10-20 International system of electrode placement. Electrical activity was acquired at a sampling rate of '500Hz'$  and reviewed with a high frequency filter of '70Hz'$  and a low frequency filter of '1Hz'$ . EEG data were recorded continuously and digitally stored.   Description: The posterior dominant rhythm consists of 8 Hz activity of moderate voltage (25-35 uV) seen predominantly in posterior head regions, symmetric and reactive to eye opening and eye closing. Sleep was characterized by vertex waves, sleep spindles (12 to 14 Hz), maximal frontocentral region.  Hyperventilation and photic stimulation were not performed.     IMPRESSION: This study is within normal limits. No seizures or epileptiform discharges were seen throughout the recording.  Melissa Case

## 2020-12-31 NOTE — Progress Notes (Signed)
LTM EEG hooked up and running - no initial skin breakdown - push button tested - neuro notified.  

## 2020-12-31 NOTE — Consult Note (Signed)
Neurology Consultation Reason for Consult: Right temporal lesion, spells of altered emotion  Requesting Physician: Neva Seat   CC: Spells of goosebumps, anxious feeling in the chest  History is obtained from: Patient, sister at bedside and chart review   HPI: Melissa Case is a 60 y.o. 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  She was recently admitted 11/17/2020 after focal seizure starting with right hand dysfunction followed by 1 minute of generalized tonic-clonic activity and postictal confusion.  She was also having significant memory impairment while in the hospital with a EEG notable for subclinical seizures in the right temporal parietal region.  She was started on Keppra and phenytoin with resolution of her symptoms.  MRI revealed a peripherally enhancing right temporal lobe lesion.  CSF studies were reassuring (RBC 178, WBC 3, total protein 30, glucose 69, HSV 1/2 PCR negative, VDRL negative, no oligoclonal bands in the CSF, fungal culture, anaerobic culture and bacterial cultures negative).  She was initially treated with dexamethasone for edema and discharged on 7/7 with a 3-week taper.  She was seen by Dr. Reatha Armour of neurosurgery, case was additionally discussed with Dr. Mickeal Skinner of oncology and she has been followed outpatient by Dr. April Case of neurology.    She was having some anxiety symptoms on her follow-up visit on 12/27/2020, at which time she also reported daytime sleepiness, poor appetite, nausea, and anxiety.  Keppra was reduced to 750 mg twice daily, Dilantin was continued at 100 mg 3 times daily and there was plan to repeat MRI brain.  Phenytoin level was also checked on 8/8 and returned at 9.2, Keppra level is still pending at this time.  Since Monday she  has been having an increased frequency in these spells described as goosebumps on her arms and legs, a feeling of anxiety in her chest that is not tightness and that she finds difficult to adequately describe, associated with intense emotional feeling and crying.  Each spell lasts 30 to 60 seconds, and the frequency has been increasing over the past few days, now occurring typically 3-4 times an hour, sometimes clustering very close together.  She continues to have significant nausea with these episodes as well as anytime she tries to eat, but otherwise denies any other symptoms on review of systems.  She was additionally counseled to try consolidating the phenytoin to 300 mg nightly instead of 100 mg 3 times daily.  She had already taken her morning dose of phenytoin yesterday and therefore took 200 last night, but has not taken any today with plans to start 300 nightly today  ROS: All other review of systems was negative except as noted in the HPI.   Past Medical History:  Diagnosis Date   Cancer ([place])    Partoid   Depression    GERD (gastroesophageal reflux disease)    Panic attack    Past Surgical History:  Procedure Laterality Date   ABDOMINAL HYSTERECTOMY     CHOLECYSTECTOMY N/A 11/21/2017   Procedure: LAPAROSCOPIC CHOLECYSTECTOMY WITH INTRAOPERATIVE CHOLANGIOGRAM;  Surgeon: Jovita Kussmaul, MD;  Location: [facility];  Service: General;  Laterality: N/A;   MULTIPLE EXTRACTIONS WITH ALVEOLOPLASTY N/A 08/28/2019   Procedure: Extraction of tooth #'s 3-7, 10-15, and 20-31 with alveoloplatsy, maxillary right buccal exostosis reduction, and bilateral mandibular tori reductions.;  Surgeon: Lenn Cal, DDS;  Location:  [facility];  Service: Oral Surgery;  Laterality: N/A;   PAROTIDECTOMY Right 07/22/2019   with biopsy; done at [facility] by Dr. Fredricka Bonine     Family History  Problem Relation Age of Onset   Depression Sister    Heart disease Mother    Diabetes Father    Breast cancer Paternal  Grandmother    Colon cancer Neg Hx    Pancreatic cancer Neg Hx    Esophageal cancer Neg Hx    Stomach cancer Neg Hx    Rectal cancer Neg Hx    Liver cancer Neg Hx    Current Outpatient Medications  Medication Instructions   acetaminophen (TYLENOL) 1,000 mg, Oral, Every 6 hours PRN   carboxymethylcellulose (REFRESH PLUS) 0.5 % SOLN 1 drop, Right Eye, Daily PRN   docusate sodium (COLACE) 100 mg, Oral, Daily PRN   esomeprazole (NEXIUM) 40 mg, Oral, Daily   levETIRAcetam (KEPPRA) 750 mg, Oral, 2 times daily   PARoxetine (PAXIL) 40 mg, Oral, Daily   phenytoin (DILANTIN) 100 mg, Oral, 3 times daily   traZODone (DESYREL) 50 MG tablet Take 1/2 to one tab as needed for insomnia.   Social History:  reports that she has been smoking cigarettes. She started smoking about 44 years ago. She has a 21.00 pack-year smoking history. She has never used smokeless tobacco. She reports that she does not drink alcohol and does not use drugs.  Exam: Current vital signs: BP 136/86   Pulse 80   Temp 98.5 F (36.9 C) (Oral)   Resp (!) 22   Ht '5\' 4"'$  (1.626 m)   Wt 63.5 kg   SpO2 97%   BMI 24.03 kg/m  Vital signs in last 24 hours: Temp:  [98.3 F (36.8 C)-98.5 F (36.9 C)] 98.5 F (36.9 C) (08/11 1958) Pulse Rate:  [71-86] 80 (08/12 0100) Resp:  [15-22] 22 (08/12 0100) BP: (115-136)/(70-86) 136/86 (08/12 0100) SpO2:  [95 %-98 %] 97 % (08/12 0100) Weight:  [63.5 kg] 63.5 kg (08/11 1357)   Physical Exam  Constitutional: Appears well-developed and well-nourished.  Psych: Affect appropriate to situation, calm and cooperative Eyes: No scleral injection HENT: No oropharyngeal obstruction.  MSK: no joint deformities.  Cardiovascular: Normal rate and regular rhythm.  Respiratory: Effort normal, non-labored breathing GI: Soft.  No distension. There is no tenderness.  Skin: Warm dry and intact visible skin  Neuro: Mental Status: Patient is awake, alert, oriented to person, place, month, year, and  situation. Patient is able to give a clear and coherent history. No signs of aphasia or neglect Cranial Nerves: II: Visual Fields are full. Pupils are equal, round, and reactive to light.   III,IV, VI: EOMI without ptosis or diploplia.  V: Facial sensation is notable for stable slight loss of sensation on the right side of her face which she reports is chronic VII: Facial movement is notable for peripheral right facial droop, chronic.  VIII: hearing is intact to voice X: Uvula elevates symmetrically XI: Shoulder shrug is symmetric. XII: tongue is midline without atrophy or fasciculations.  Motor: Tone is normal. Bulk is normal. 5/5 strength was present in all four extremities.  Sensory: Sensation is symmetric to light touch in the arms and legs. Deep Tendon Reflexes: 2+ and symmetric in the biceps and in the patellae.  Plantars: Toes are downgoing bilaterally.  Cerebellar: FNF and HKS are intact bilaterally  I have reviewed labs in epic and the results pertinent to this consultation are: CMP unremarkable other than mildly reduced  total protein at 6.3 CBC notable for leukopenia to 3.8 (baseline 6.3)  I have reviewed the images obtained: 8/11 MRI brain personally reviewed, and compared to 11/17/2020 MRI. Agree with radiology read 1. Interval increase in size of enhancing lesion at the anterior right temporal lobe, now measuring 0.8 x 1.5 x 2.5 cm. Associated vasogenic edema within the adjacent right temporal region has also slightly worsened. 2. No other acute intracranial abnormality. 3. Right mastoid effusion.  Impression: This is a 60 year old woman with past medical history significant for parotid cancer with involvement of the right facial nerve now presenting with a worsening right temporal lesion.  I am concerned that the progression of imaging findings is concerning for a neoplastic process.  I am also concerned that the spells she is having represent focal seizures given  increasing frequency in the setting of reducing Keppra dose.  Recommendations: -Long-term EEG monitoring -Continue Keppra 750 mg twice daily for now, discussed with patient that we may increase back to 1000 mg daily pending EEG results -Increase phenytoin from 100 mg 3 times daily to 130 mg 3 times daily starting tomorrow morning, will give 300 mg this evening -Dexamethasone 10 mg IV once followed by 4 mg every 6 hours for edema -We will reach out to Dr. Reatha Armour and Dr. Mickeal Skinner tomorrow -Neurology will continue to follow  Lesleigh Noe MD-PhD [facility] [phone] Available 7 PM to 7 AM, outside of these hours please call Neurologist on call as listed on Amion.

## 2020-12-31 NOTE — Progress Notes (Signed)
EEG complete - results pending 

## 2020-12-31 NOTE — ED Notes (Signed)
Pt asked for a cup of ice

## 2020-12-31 NOTE — Consult Note (Signed)
   Providing Compassionate, Quality Care - Together  Neurosurgery Consult  Referring physician: Dr. Louanne Belton Reason for referral: Intracranial mass  Chief Complaint: Anxiety, tingling in the left arm and trunk  History of Present Illness: This is a 60 year old female with a history of parotid cancer status post resection and radiation (completed 09/09/2019).  She presented in June 2022 focal seizures from the right temporoparietal region.  Work-up revealed a small peripherally enhancing mass in the inferior temporal lobe, spectroscopy favored radiation necrosis.  She was placed on a long taper of steroids, and 2 antiepileptics.  Her seizures were controlled at that time.  Recently few days ago she states she began having numbness and tingling type sensation as well as anxiety and tightness in her chest.  She describes it as also a goose bump type feeling in her arm and leg on the left.  She denies any headaches, vision changes, focal weakness or bowel or bladder changes.  Denies any recent trauma or infections.  She states she has multiple episodes this time feeling every day, but only last about 30 to 60 seconds.  Her sister is at her bedside and confirmed that this has been ongoing for possibly greater than 1 week.  She recently had adjustment of her antiepileptics earlier this month by her neurologist.   Medications: I have reviewed the patient's current medications. Allergies: No Known Allergies  History reviewed. No pertinent family history. Social History:  has no history on file for tobacco use, alcohol use, and drug use.  ROS: All positives and negatives are listed HPI above  Physical Exam:  Vital signs in last 24 hours: Temp:  [98 F (36.7 C)-98.3 F (36.8 C)] 98 F (36.7 C) (07/25 1814) Pulse Rate:  [58-128] 65 (07/26 0746) Resp:  [11-18] 14 (07/26 0217) BP: (138-182)/(65-125) 153/88 (07/26 0700) SpO2:  [91 %-98 %] 96 % (07/26 0746) PE: Awake alert oriented x3 PERRLA Right  facial weakness, chronic Otherwise cranial nerves intact throughout Bilateral upper/lower extremity full strength throughout Sensory intact to light touch No dysmetria   Impression/Assessment:  60 year old female with  Right temporal radiation necrosis Seizures Anxiety  Plan:  -Recommend Decadron 4 every 6.  Will need likely along steroid treatment for radiation necrosis.  Her MRI was reviewed.  There is slight enlargement of her previous mass which could again still be radiation necrosis.  We will discuss this case in tumor board on Monday. -I do not recommend any acute neurosurgical intervention. -Will continue to follow -EEG in place, neurology following, adjustments to her antiepileptics have been made, currently on Keppra and Dilantin   Adaria Hole C Marketia Stallsmith 12/15/2019, 10:05 AM

## 2020-12-31 NOTE — ED Notes (Signed)
Provider at the bedside.  

## 2020-12-31 NOTE — Progress Notes (Signed)
EEG machine and pt transported to  3w from ED .  EEG machine correctly set up.[facility] monitored, Event button test confirmed by [facility].

## 2020-12-31 NOTE — Procedures (Addendum)
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  EEG to evaluate for seizures.   Level of alertness: Awake, asleep   AEDs during EEG study: LEV, PHT   Technical aspects: This EEG study was done with scalp electrodes positioned according to the 10-20 International system of electrode placement. Electrical activity was acquired at a sampling rate of '500Hz'$  and reviewed with a high frequency filter of '70Hz'$  and a low frequency filter of '1Hz'$ . EEG data were recorded continuously and digitally stored.    Description: The posterior dominant rhythm consists of 8 Hz activity of moderate voltage (25-35 uV) seen predominantly in posterior head regions, symmetric and reactive to eye opening and eye closing. Sleep was characterized by vertex waves, sleep spindles (12 to 14 Hz), maximal frontocentral region.  Hyperventilation and photic stimulation were not performed.      IMPRESSION: This study is within normal limits. No seizures or epileptiform discharges were seen throughout the recording.   [name] [name]

## 2020-12-31 NOTE — Progress Notes (Signed)
Virtual Visit via Telephone Note  I connected with Melissa Case on 12/31/20 at 11:00 AM EDT by telephone and verified that I am speaking with the correct person using two identifiers.  Location: Patient: Hospital ED Provider: Home Office   I discussed the limitations, risks, security and privacy concerns of performing an evaluation and management service by telephone and the availability of in person appointments. I also discussed with the patient that there may be a patient responsible charge related to this service. The patient expressed understanding and agreed to proceed.   History of Present Illness: Patient requested an earlier appointment.  Currently she is in the hospital emergency room waiting to be admitted.  Patient told she started to have seizures like activity since June 29 and she admitted for almost a week in the hospital and had neurology work-up.  She requested appointment because her neurologist suggested to see a psychiatrist as patient is having a lot of anxiety and nervousness sensation but turned out that she had swelling in her brain that may be causing the symptoms.  Patient has parotid gland tumor and gone through radiation.  Patient reported once she find out it is tumor related she is hoping to get relief.  She denies any panic attack, crying spells, feeling of hopelessness or depression.  She feels the Paxil is working very well and she has been on medication for more than 10 years.  She was pleased that her son and his family came to visit her.  I reviewed imaging studies showing increase in size of enhancing lesion in the right temporal lobe.  Edema has been slightly worsened.  Past Psychiatric History:  H/O anxiety and depression. No h/o inpatient treatment, suicidal attempt, or psychosis. Trazodone worked.   Psychiatric Specialty Exam: Physical Exam  Review of Systems  Weight 140 lb (63.5 kg).Body mass index is 24.03 kg/m.  General Appearance: NA  Eye  Contact:  NA  Speech:  Slow  Volume:  Decreased  Mood:  Anxious  Affect:  NA  Thought Process:  Goal Directed  Orientation:  Full (Time, Place, and Person)  Thought Content:  Rumination  Suicidal Thoughts:  No  Homicidal Thoughts:  No  Memory:  Immediate;   Good Recent;   Good Remote;   Good  Judgement:  Intact  Insight:  Present  Psychomotor Activity:  NA  Concentration:  Concentration: Fair and Attention Span: Fair  Recall:  [place] of Knowledge:  Good  Language:  Good  Akathisia:  No  Handed:  Right  AIMS (if indicated):     Assets:  Communication Skills Desire for Improvement Housing Resilience Transportation  ADL's:  Intact  Cognition:  WNL  Sleep:   fair      Assessment and Plan: Major depressive disorder, recurrent.  Anxiety.  I reviewed notes from the neurologist.  Neuro imaging study shows increased edema and temporal lobe.  Reassurance given that sensation could be due to recent changes in her brain MRI.  Patient like to keep the Paxil as she noticed it helps her depression and anxiety.  I will forward my note to her neurologist Dr. April Manson.  Patient like to keep her appointment in December.  I recommend to call us back if she has any question or any concern.  Follow-up in December.  No changes in her psychotropic medication.    Follow Up Instructions:    I discussed the assessment and treatment plan with the patient. The patient was provided an opportunity to  ask questions and all were answered. The patient agreed with the plan and demonstrated an understanding of the instructions.   The patient was advised to call back or seek an in-person evaluation if the symptoms worsen or if the condition fails to improve as anticipated.  I provided 15 minutes of non-face-to-face time during this encounter.   Kathlee Nations, MD

## 2020-12-31 NOTE — ED Notes (Signed)
EEG technician at the bedside.

## 2020-12-31 NOTE — Progress Notes (Signed)
PROGRESS NOTE  Melissa Case [id number] DOB: 28-May-1960 DOA: 12/30/2020 PCP: Rachel Moulds, DO   LOS: 0 days   Brief narrative:  Melissa Case is a 60 y.o. female with past medical history of parotid gland cancer with metastasis, facial paralysis, depression, anxiety, seizure, GERD presented to the hospital with abnormal sensation in her body for the last 2 to 4 days with goosebumps lasting for around 15 seconds or more associated with anxiety during these episodes.  Of note patient was recently admitted to the hospital for atypical seizure which was proven through EEG.  During that time patient had an MRI of the brain which showed enhancing lesion with vasogenic edema consistent with postradiation necrosis.  She was evaluated by neurology and neurosurgery at that time and neurosurgery recommended repeat MRI in patient was started on antiepileptics.  In the ED vitals were stable.  CT head scan showed similar findings to 2weeks back with temporal lobe edema with areas of calcification.  MR brain was also repeated which showed mild increase in size of the enhancing lesion to 0.8 x 1.5 x 2.5 up from 0.8 x 1.4 x 2.4 during previous admission.  There is also associated vasogenic edema once again demonstrated which is more significant than previously.  Patient started on Keppra and phenytoin in the ED as well as Decadron.  Neurology was consulted and patient was put on long-term EEG monitoring and was admitted to the hospital.  Assessment/Plan:  Principal Problem:   Cerebral edema (HCC) Active Problems:   Seizure ([place])   GERD (gastroesophageal reflux disease)  Cerebral edema/Seizure disorder/Temporal mass Patient presenting with abnormal sensation in the body with goosebumps.  Question of atypical seizures.  Lesion over the anterior temporal lobe suspicious for radiation necrosis with vasogenic edema.  Neurosurgery was supposed to follow-up as outpatient but will consult while in the hospital.  I  spoke with Dr. Annette Stable for opinion.  We will continue with antiepileptics and IV Decadron.  Neurology has seen the patient.  Currently on phenytoin and Keppra.  On EEG currently.  Will follow neurosurgical opinion   History of parotid cancer in 2021 Has had treatment for mets in the past.   GERD - Continue home PPI   Depression and anxiety - Continue home paroxetine  DVT prophylaxis: Place and maintain sequential compression device Start: 12/30/20 2301   Code Status: Full code  Family Communication: Spoke with the patient's sister at bedside.  Status is: Observation  The patient will require care spanning > 2 midnights and should be moved to inpatient because: Ongoing diagnostic testing needed not appropriate for outpatient work up, IV treatments appropriate due to intensity of illness or inability to take PO, Inpatient level of care appropriate due to severity of illness, and consultation and further monitoring.  Dispo: The patient is from: Home              Anticipated d/c is to: Home              Patient currently is not medically stable to d/c.   Difficult to place patient No   Consultants: Neurosurgery Neurology  Procedures: EEG  Anti-infectives:  None  Anti-infectives (From admission, onward)    None      Subjective: Today, patient was seen and examined at bedside.  Still complains of a feeling of episodes of with feeling of goosebumps in her body.  On EEG monitoring currently.  Patient sister at bedside.  Objective: Vitals:   12/31/20 0830 12/31/20 1145  BP:  121/70 120/66  Pulse: 76 83  Resp: 13 (!) 29  Temp:    SpO2: 98% 99%    Intake/Output Summary (Last 24 hours) at 12/31/2020 1401 Last data filed at 12/31/2020 1014 Gross per 24 hour  Intake 100 ml  Output --  Net 100 ml   Filed Weights   12/30/20 1357  Weight: 63.5 kg   Body mass index is 24.03 kg/m.   Physical Exam:  GENERAL: Patient is alert awake and oriented. Not in obvious  distress. HENT: No scleral pallor or icterus. Pupils equally reactive to light. Oral mucosa is moist.  EEG monitoring in place NECK: is supple, no gross swelling noted. CHEST: Clear to auscultation. No crackles or wheezes.  Diminished breath sounds bilaterally. CVS: S1 and S2 heard, no murmur. Regular rate and rhythm.  ABDOMEN: Soft, non-tender, bowel sounds are present. EXTREMITIES: No edema. CNS: Cranial nerves are intact.  Right-sided facial palsy noted, alert awake and oriented SKIN: warm and dry without rashes.  Data Review: I have personally reviewed the following laboratory data and studies,  CBC: Recent Labs  Lab 12/30/20 1735 12/31/20 0224  WBC 3.8* 4.4  NEUTROABS 2.1  --   HGB 12.5 13.1  HCT 38.2 41.2  MCV 92.7 94.5  PLT 248 [id number]   Basic Metabolic Panel: Recent Labs  Lab 12/30/20 1735 12/31/20 0224  NA 139 138  K 3.7 4.1  CL 103 104  CO2 27 26  GLUCOSE 84 111*  BUN 7 7  CREATININE 0.49 0.53  CALCIUM 9.1 9.4   Liver Function Tests: Recent Labs  Lab 12/30/20 1735 12/31/20 0224  AST 26 28  ALT 33 36  ALKPHOS 86 92  BILITOT 0.2* 0.6  PROT 6.3* 6.4*  ALBUMIN 3.6 3.6   No results for input(s): LIPASE, AMYLASE in the last 168 hours. No results for input(s): AMMONIA in the last 168 hours. Cardiac Enzymes: No results for input(s): CKTOTAL, CKMB, CKMBINDEX, TROPONINI in the last 168 hours. BNP (last 3 results) Recent Labs    11/20/20 0132 11/21/20 0117 11/22/20 0101  BNP 31.5 14.0 7.2    ProBNP (last 3 results) No results for input(s): PROBNP in the last 8760 hours.  CBG: No results for input(s): GLUCAP in the last 168 hours. Recent Results (from the past 240 hour(s))  Resp Panel by RT-PCR (Flu A&B, Covid) Nasopharyngeal Swab     Status: None   Collection Time: 12/30/20  9:21 PM   Specimen: Nasopharyngeal Swab; Nasopharyngeal(NP) swabs in vial transport medium  Result Value Ref Range Status   SARS Coronavirus 2 by RT PCR NEGATIVE NEGATIVE Final     Comment: (NOTE) SARS-CoV-2 target nucleic acids are NOT DETECTED.  The SARS-CoV-2 RNA is generally detectable in upper respiratory specimens during the acute phase of infection. The lowest concentration of SARS-CoV-2 viral copies this assay can detect is 138 copies/mL. A negative result does not preclude SARS-Cov-2 infection and should not be used as the sole basis for treatment or other patient management decisions. A negative result may occur with  improper specimen collection/handling, submission of specimen other than nasopharyngeal swab, presence of viral mutation(s) within the areas targeted by this assay, and inadequate number of viral copies(<138 copies/mL). A negative result must be combined with clinical observations, patient history, and epidemiological information. The expected result is Negative.  Fact Sheet for Patients:  EntrepreneurPulse.com.au  Fact Sheet for Healthcare Providers:  IncredibleEmployment.be  This test is no t yet approved or cleared by the [place] and  has been authorized for detection and/or diagnosis of SARS-CoV-2 by FDA under an Emergency Use Authorization (EUA). This EUA will remain  in effect (meaning this test can be used) for the duration of the COVID-19 declaration under Section 564(b)(1) of the Act, 21 U.S.C.section 360bbb-3(b)(1), unless the authorization is terminated  or revoked sooner.       Influenza A by PCR NEGATIVE NEGATIVE Final   Influenza B by PCR NEGATIVE NEGATIVE Final    Comment: (NOTE) The Xpert Xpress SARS-CoV-2/FLU/RSV plus assay is intended as an aid in the diagnosis of influenza from Nasopharyngeal swab specimens and should not be used as a sole basis for treatment. Nasal washings and aspirates are unacceptable for Xpert Xpress SARS-CoV-2/FLU/RSV testing.  Fact Sheet for Patients: EntrepreneurPulse.com.au  Fact Sheet for Healthcare  Providers: IncredibleEmployment.be  This test is not yet approved or cleared by the [place] FDA and has been authorized for detection and/or diagnosis of SARS-CoV-2 by FDA under an Emergency Use Authorization (EUA). This EUA will remain in effect (meaning this test can be used) for the duration of the COVID-19 declaration under Section 564(b)(1) of the Act, 21 U.S.C. section 360bbb-3(b)(1), unless the authorization is terminated or revoked.  Performed at [facility], [place] [address] [place], [place] [address]      Studies: CT HEAD WO CONTRAST (5MM)  Result Date: 12/30/2020 CLINICAL DATA:  Seizure, nontraumatic. EXAM: CT HEAD WITHOUT CONTRAST TECHNIQUE: Contiguous axial images were obtained from the base of the skull through the vertex without intravenous contrast. COMPARISON:  11/18/2020.  11/17/2020.  06/25/2019. FINDINGS: Brain: No abnormality seen affecting the brainstem or cerebellum. Left cerebral hemisphere is normal. There is artifact relating to some sort of ocular or eyelid implant. There is persistent vasogenic edema within the right temporal lobe, very similar to the study 5-6 weeks ago. Some parenchymal calcification remains visible. Again the differential diagnosis is that of metastatic disease versus is radiation necrosis. No hydrocephalus. No extra-axial fluid collection. Vascular: There is atherosclerotic calcification of the major vessels at the base of the brain. Skull: Negative Sinuses/Orbits: Clear other than a retention cyst of the right maxillary sinus. Orbital or ocular implant on the right as noted above. Other: Previous right parotidectomy. IMPRESSION: Similar appearance to the study of 5-6 weeks ago. Right temporal lobe edema with some areas of calcification. The differential diagnosis is metastatic disease versus radiation necrosis, with radiation necrosis being favored. Electronically Signed   By: Nelson Chimes M.D.   On: 12/30/2020 19:25    MR Brain W and Wo Contrast  Result Date: 12/30/2020 CLINICAL DATA:  Initial evaluation for acute seizure. EXAM: MRI HEAD WITHOUT AND WITH CONTRAST TECHNIQUE: Multiplanar, multiecho pulse sequences of the brain and surrounding structures were obtained without and with intravenous contrast. CONTRAST:  70m GADAVIST GADOBUTROL 1 MMOL/ML IV SOLN COMPARISON:  Head CT from earlier the same day as well as previous MRI from 11/17/2020. FINDINGS: Brain: Previously identified enhancing masslike lesion at the anterior right temporal lobe again seen. This measures 0.8 x 1.5 x 2.5 cm on today's exam, increased in size from previous. Asymmetric enhancement involving the mastoid and tympanic segments of the right seventh cranial nerve again noted (series 22, image 21). Probable subtle asymmetric enhancement noted along the dura of the inferior right temporal lobe. Persistent associated vasogenic edema within the adjacent right temporal region has also somewhat worsened. Edema involves the adjacent mesial right temporal lobe and right hippocampus. No more than trace right-to-left shift. No other evidence for  acute or subacute infarct. Gray-white matter differentiation otherwise maintained. No other areas of chronic cortical infarction. No other evidence for acute or chronic intracranial hemorrhage. No other mass lesion or mass effect. No other abnormal enhancement. No hydrocephalus or extra-axial fluid collection. Pituitary gland suprasellar region normal. Vascular: Major intracranial vascular flow voids are maintained. Skull and upper cervical spine: Craniocervical junction within normal limits. Bone marrow signal intensity normal. 4 mm enhancing focus at the posterior right occipital calvarium noted, stable from prior (series 22, image 35). Finding is nonspecific, but favored to be benign. No other focal marrow replacing lesion. Sinuses/Orbits: Globes orbital soft tissues demonstrate no acute finding. Right maxillary sinus  retention cyst noted. Paranasal sinuses are otherwise largely clear. Right mastoid effusion. Other: Sequelae of prior right parotid resection partially visualized. IMPRESSION: 1. Interval increase in size of enhancing lesion at the anterior right temporal lobe, now measuring 0.8 x 1.5 x 2.5 cm. Associated vasogenic edema within the adjacent right temporal region has also slightly worsened. 2. No other acute intracranial abnormality. 3. Right mastoid effusion. Results were discussed by telephone at the time of interpretation on 12/30/2020 at 10:09 pm to provider Dr. Curly Sh[place]. Electronically Signed   By: Jeannine Boga M.D.   On: 12/30/2020 22:19      Flora Lipps, MD  [facility] 12/31/2020  If 7PM-7AM, please contact night-coverage

## 2020-12-31 NOTE — ED Notes (Signed)
Nurse report given to Meriel Pica, RN, awaiting a clean bed.

## 2021-01-01 DIAGNOSIS — G939 Disorder of brain, unspecified: Secondary | ICD-10-CM | POA: Diagnosis present

## 2021-01-01 DIAGNOSIS — Z803 Family history of malignant neoplasm of breast: Secondary | ICD-10-CM | POA: Diagnosis not present

## 2021-01-01 DIAGNOSIS — Z85818 Personal history of malignant neoplasm of other sites of lip, oral cavity, and pharynx: Secondary | ICD-10-CM | POA: Diagnosis not present

## 2021-01-01 DIAGNOSIS — Z8249 Family history of ischemic heart disease and other diseases of the circulatory system: Secondary | ICD-10-CM | POA: Diagnosis not present

## 2021-01-01 DIAGNOSIS — F419 Anxiety disorder, unspecified: Secondary | ICD-10-CM | POA: Diagnosis present

## 2021-01-01 DIAGNOSIS — G936 Cerebral edema: Secondary | ICD-10-CM | POA: Diagnosis present

## 2021-01-01 DIAGNOSIS — R569 Unspecified convulsions: Secondary | ICD-10-CM | POA: Diagnosis not present

## 2021-01-01 DIAGNOSIS — F32A Depression, unspecified: Secondary | ICD-10-CM | POA: Diagnosis present

## 2021-01-01 DIAGNOSIS — G9389 Other specified disorders of brain: Secondary | ICD-10-CM | POA: Diagnosis present

## 2021-01-01 DIAGNOSIS — Z90710 Acquired absence of both cervix and uterus: Secondary | ICD-10-CM | POA: Diagnosis not present

## 2021-01-01 DIAGNOSIS — Z885 Allergy status to narcotic agent status: Secondary | ICD-10-CM | POA: Diagnosis not present

## 2021-01-01 DIAGNOSIS — Z923 Personal history of irradiation: Secondary | ICD-10-CM | POA: Diagnosis not present

## 2021-01-01 DIAGNOSIS — D72819 Decreased white blood cell count, unspecified: Secondary | ICD-10-CM | POA: Diagnosis present

## 2021-01-01 DIAGNOSIS — I6789 Other cerebrovascular disease: Secondary | ICD-10-CM

## 2021-01-01 DIAGNOSIS — Y842 Radiological procedure and radiotherapy as the cause of abnormal reaction of the patient, or of later complication, without mention of misadventure at the time of the procedure: Secondary | ICD-10-CM

## 2021-01-01 DIAGNOSIS — Z833 Family history of diabetes mellitus: Secondary | ICD-10-CM | POA: Diagnosis not present

## 2021-01-01 DIAGNOSIS — T66XXXA Radiation sickness, unspecified, initial encounter: Secondary | ICD-10-CM | POA: Diagnosis present

## 2021-01-01 DIAGNOSIS — C799 Secondary malignant neoplasm of unspecified site: Secondary | ICD-10-CM | POA: Diagnosis present

## 2021-01-01 DIAGNOSIS — K219 Gastro-esophageal reflux disease without esophagitis: Secondary | ICD-10-CM | POA: Diagnosis present

## 2021-01-01 DIAGNOSIS — F17210 Nicotine dependence, cigarettes, uncomplicated: Secondary | ICD-10-CM | POA: Diagnosis present

## 2021-01-01 DIAGNOSIS — G40109 Localization-related (focal) (partial) symptomatic epilepsy and epileptic syndromes with simple partial seizures, not intractable, without status epilepticus: Secondary | ICD-10-CM | POA: Diagnosis present

## 2021-01-01 DIAGNOSIS — Z79899 Other long term (current) drug therapy: Secondary | ICD-10-CM | POA: Diagnosis not present

## 2021-01-01 DIAGNOSIS — Z20822 Contact with and (suspected) exposure to covid-19: Secondary | ICD-10-CM | POA: Diagnosis present

## 2021-01-01 DIAGNOSIS — Z880 Allergy status to penicillin: Secondary | ICD-10-CM | POA: Diagnosis not present

## 2021-01-01 LAB — CBC
HCT: 37.3 % (ref 36.0–46.0)
Hemoglobin: 12.6 g/dL (ref 12.0–15.0)
MCH: 31 pg (ref 26.0–34.0)
MCHC: 33.8 g/dL (ref 30.0–36.0)
MCV: 91.6 fL (ref 80.0–100.0)
Platelets: 210 10*3/uL (ref 150–400)
RBC: 4.07 MIL/uL (ref 3.87–5.11)
RDW: 13.2 % (ref 11.5–15.5)
WBC: 3.8 10*3/uL — ABNORMAL LOW (ref 4.0–10.5)
nRBC: 0 % (ref 0.0–0.2)

## 2021-01-01 LAB — COMPREHENSIVE METABOLIC PANEL
ALT: 35 U/L (ref 0–44)
AST: 25 U/L (ref 15–41)
Albumin: 3.4 g/dL — ABNORMAL LOW (ref 3.5–5.0)
Alkaline Phosphatase: 79 U/L (ref 38–126)
Anion gap: 8 (ref 5–15)
BUN: 9 mg/dL (ref 6–20)
CO2: 27 mmol/L (ref 22–32)
Calcium: 9.1 mg/dL (ref 8.9–10.3)
Chloride: 103 mmol/L (ref 98–111)
Creatinine, Ser: 0.52 mg/dL (ref 0.44–1.00)
GFR, Estimated: >60 mL/min (ref >60)
Glucose, Bld: 105 mg/dL — ABNORMAL HIGH (ref 70–99)
Potassium: 3.6 mmol/L (ref 3.5–5.1)
Sodium: 138 mmol/L (ref 135–145)
Total Bilirubin: 0.3 mg/dL (ref 0.3–1.2)
Total Protein: 6.1 g/dL — ABNORMAL LOW (ref 6.5–8.1)

## 2021-01-01 LAB — MAGNESIUM: Magnesium: 2 mg/dL (ref 1.7–2.4)

## 2021-01-01 LAB — PHOSPHORUS: Phosphorus: 4.5 mg/dL (ref 2.5–4.6)

## 2021-01-01 MED ORDER — NICOTINE 21 MG/24HR TD PT24
21.0000 mg | MEDICATED_PATCH | Freq: Every day | TRANSDERMAL | Status: DC
  Administered 2021-01-01 – 2021-01-02 (×2): 21 mg via TRANSDERMAL
  Filled 2021-01-01 (×2): qty 1

## 2021-01-01 NOTE — Progress Notes (Signed)
LTM discontinued; no skin breakdown was seen. [facility] notified.

## 2021-01-01 NOTE — Plan of Care (Signed)

## 2021-01-01 NOTE — Procedures (Addendum)
Patient Name: Melissa Case  MRN: [id number]  Epilepsy Attending: Lora Havens  Referring Physician/Provider: Dr Lesleigh Noe Duration: 01/01/2021 0209 to 01/01/2021 0945   Patient history: 60 year old female with history of parotid gland tumor involving right facial nerve status postresection and radiation, right temporal brain lesion, focal seizures with secondary generalization who presented with spells described as goosebumps on her arms and legs, feeling of anxiety in her chest that is not tightness associated with intense emotional feeling and crying lasting about 30 to 60 seconds occurring 3-4 times an hour.  EEG to evaluate for seizures.   Level of alertness: Awake, asleep   AEDs during EEG study: LEV, PHT   Technical aspects: This EEG study was done with scalp electrodes positioned according to the 10-20 International system of electrode placement. Electrical activity was acquired at a sampling rate of '500Hz'$  and reviewed with a high frequency filter of '70Hz'$  and a low frequency filter of '1Hz'$ . EEG data were recorded continuously and digitally stored.    Description: The posterior dominant rhythm consists of 8 Hz activity of moderate voltage (25-35 uV) seen predominantly in posterior head regions, symmetric and reactive to eye opening and eye closing. Sleep was characterized by vertex waves, sleep spindles (12 to 14 Hz), maximal frontocentral region.  Hyperventilation and photic stimulation were not performed.      IMPRESSION: This study is within normal limits. No seizures or epileptiform discharges were seen throughout the recording.   Melissa Case Melissa Case

## 2021-01-01 NOTE — Progress Notes (Addendum)
Patient improved on steroids.  Minimal headache.  No further seizures.  Neurologic exam stable.  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  Continue steroids.  Mobilize as tolerated.  Okay for discharge  From our standpoint.

## 2021-01-01 NOTE — Progress Notes (Signed)
PROGRESS NOTE  Deryn Zelinski [id number] DOB: 19-Dec-1960 DOA: 12/30/2020 PCP: Rachel Moulds, DO   LOS: 0 days   Brief narrative:  Suad Jansson is a 60 y.o. female with past medical history of parotid gland cancer with metastasis, facial paralysis, depression, anxiety, seizure, GERD presented to the hospital with abnormal sensation in her body for the last 2 to 4 days with goosebumps lasting for around 15 seconds or more associated with anxiety during these episodes.  Of note patient was recently admitted to the hospital for atypical seizure which was proven through EEG.  During that time patient had an MRI of the brain which showed enhancing lesion with vasogenic edema consistent with postradiation necrosis.  She was evaluated by neurology and neurosurgery at that time and neurosurgery recommended repeat MRI in patient was started on antiepileptics.  In the ED vitals were stable.  CT head scan showed similar findings to 2weeks back with temporal lobe edema with areas of calcification.  MR brain was also repeated which showed mild increase in size of the enhancing lesion to 0.8 x 1.5 x 2.5 up from 0.8 x 1.4 x 2.4 during previous admission.  There is also associated vasogenic edema once again demonstrated which is more significant than previously.  Patient started on Keppra and phenytoin in the ED as well as Decadron.  Neurology was consulted and patient was put on long-term EEG monitoring and was admitted to the hospital.  Assessment/Plan:  Principal Problem:   Cerebral edema (HCC) Active Problems:   Seizure (Chloride)   GERD (gastroesophageal reflux disease)  Cerebral edema/Seizure disorder/Temporal mass Patient presented with abnormal sensation in the body with goosebumps.  Question of atypical seizures.  Lesion over the anterior temporal lobe suspicious for radiation necrosis with vasogenic edema.  Neurosurgery has seen the patient at this time and recommend discussing in tumor board on Monday  and recommended continuation of steroids at this time.  Neurology has continued with phenytoin and Keppra..  History of parotid cancer in 2021 Has had treatment for mets in the past.   GERD - Continue home PPI   Depression and anxiety - Continue home paroxetine  DVT prophylaxis: Place and maintain sequential compression device Start: 12/30/20 2301   Code Status: Full code  Family Communication: None today.  I spoke with the patient's sister at bedside yesterday..  Status is: Observation  The patient will require care spanning > 2 midnights and should be moved to inpatient because: Ongoing diagnostic testing needed not appropriate for outpatient work up, IV treatments appropriate due to intensity of illness or inability to take PO, Inpatient level of care appropriate due to severity of illness, and .  Dispo: The patient is from: Home              Anticipated d/c is to: Home              Patient currently is not medically stable to d/c.   Difficult to place patient No   Consultants: Neurosurgery Neurology  Procedures: EEG  Anti-infectives:  None  Anti-infectives (From admission, onward)    None      Subjective: Today, patient was seen and examined at bedside.  Feels better today with episodes of goosebumps.  Denies any headache nausea vomiting.  Objective: Vitals:   01/01/21 0340 01/01/21 0903  BP: 133/70 118/61  Pulse: 67 81  Resp: 17 18  Temp: 99 F (37.2 C) 97.6 F (36.4 C)  SpO2: 97% 97%    Intake/Output Summary (Last 24  hours) at 01/01/2021 0921 Last data filed at 12/31/2020 1014 Gross per 24 hour  Intake 100 ml  Output --  Net 100 ml    Filed Weights   12/30/20 1357  Weight: 63.5 kg   Body mass index is 24.03 kg/m.   Physical Exam:  GENERAL: Patient is alert awake and oriented. Not in obvious distress. HENT: No scleral pallor or icterus. Pupils equally reactive to light. Oral mucosa is moist.  EEG monitoring in place NECK: is supple, no  gross swelling noted. CHEST: Clear to auscultation. No crackles or wheezes.  Diminished breath sounds bilaterally. CVS: S1 and S2 heard, no murmur. Regular rate and rhythm.  ABDOMEN: Soft, non-tender, bowel sounds are present. EXTREMITIES: No edema. CNS: Cranial nerves are intact.  Right-sided facial palsy, alert awake and oriented SKIN: warm and dry without rashes.  Data Review: I have personally reviewed the following laboratory data and studies,  CBC: Recent Labs  Lab 12/30/20 1735 12/31/20 0224 01/01/21 0414  WBC 3.8* 4.4 3.8*  NEUTROABS 2.1  --   --   HGB 12.5 13.1 12.6  HCT 38.2 41.2 37.3  MCV 92.7 94.5 91.6  PLT 248 257 [id number]    Basic Metabolic Panel: Recent Labs  Lab 12/30/20 1735 12/31/20 0224 01/01/21 0414  NA 139 138 138  K 3.7 4.1 3.6  CL 103 104 103  CO2 '27 26 27  '$ GLUCOSE 84 111* 105*  BUN '7 7 9  '$ CREATININE 0.49 0.53 0.52  CALCIUM 9.1 9.4 9.1  MG  --   --  2.0  PHOS  --   --  4.5    Liver Function Tests: Recent Labs  Lab 12/30/20 1735 12/31/20 0224 01/01/21 0414  AST '26 28 25  '$ ALT 33 36 35  ALKPHOS 86 92 79  BILITOT 0.2* 0.6 0.3  PROT 6.3* 6.4* 6.1*  ALBUMIN 3.6 3.6 3.4*    No results for input(s): LIPASE, AMYLASE in the last 168 hours. No results for input(s): AMMONIA in the last 168 hours. Cardiac Enzymes: No results for input(s): CKTOTAL, CKMB, CKMBINDEX, TROPONINI in the last 168 hours. BNP (last 3 results) Recent Labs    11/20/20 0132 11/21/20 0117 11/22/20 0101  BNP 31.5 14.0 7.2     ProBNP (last 3 results) No results for input(s): PROBNP in the last 8760 hours.  CBG: No results for input(s): GLUCAP in the last 168 hours. Recent Results (from the past 240 hour(s))  Resp Panel by RT-PCR (Flu A&B, Covid) Nasopharyngeal Swab     Status: None   Collection Time: 12/30/20  9:21 PM   Specimen: Nasopharyngeal Swab; Nasopharyngeal(NP) swabs in vial transport medium  Result Value Ref Range Status   SARS Coronavirus 2 by RT PCR  NEGATIVE NEGATIVE Final    Comment: (NOTE) SARS-CoV-2 target nucleic acids are NOT DETECTED.  The SARS-CoV-2 RNA is generally detectable in upper respiratory specimens during the acute phase of infection. The lowest concentration of SARS-CoV-2 viral copies this assay can detect is 138 copies/mL. A negative result does not preclude SARS-Cov-2 infection and should not be used as the sole basis for treatment or other patient management decisions. A negative result may occur with  improper specimen collection/handling, submission of specimen other than nasopharyngeal swab, presence of viral mutation(s) within the areas targeted by this assay, and inadequate number of viral copies(<138 copies/mL). A negative result must be combined with clinical observations, patient history, and epidemiological information. The expected result is Negative.  Fact Sheet for Patients:  EntrepreneurPulse.com.au  Fact Sheet for Healthcare Providers:  IncredibleEmployment.be  This test is no t yet approved or cleared by the [place] FDA and  has been authorized for detection and/or diagnosis of SARS-CoV-2 by FDA under an Emergency Use Authorization (EUA). This EUA will remain  in effect (meaning this test can be used) for the duration of the COVID-19 declaration under Section 564(b)(1) of the Act, 21 U.S.C.section 360bbb-3(b)(1), unless the authorization is terminated  or revoked sooner.       Influenza A by PCR NEGATIVE NEGATIVE Final   Influenza B by PCR NEGATIVE NEGATIVE Final    Comment: (NOTE) The Xpert Xpress SARS-CoV-2/FLU/RSV plus assay is intended as an aid in the diagnosis of influenza from Nasopharyngeal swab specimens and should not be used as a sole basis for treatment. Nasal washings and aspirates are unacceptable for Xpert Xpress SARS-CoV-2/FLU/RSV testing.  Fact Sheet for Patients: EntrepreneurPulse.com.au  Fact Sheet for  Healthcare Providers: IncredibleEmployment.be  This test is not yet approved or cleared by the [place] FDA and has been authorized for detection and/or diagnosis of SARS-CoV-2 by FDA under an Emergency Use Authorization (EUA). This EUA will remain in effect (meaning this test can be used) for the duration of the COVID-19 declaration under Section 564(b)(1) of the Act, 21 U.S.C. section 360bbb-3(b)(1), unless the authorization is terminated or revoked.  Performed at [facility], [place] [address], [place], [place] [address]       Studies: CT HEAD WO CONTRAST (5MM)  Result Date: 12/30/2020 CLINICAL DATA:  Seizure, nontraumatic. EXAM: CT HEAD WITHOUT CONTRAST TECHNIQUE: Contiguous axial images were obtained from the base of the skull through the vertex without intravenous contrast. COMPARISON:  11/18/2020.  11/17/2020.  06/25/2019. FINDINGS: Brain: No abnormality seen affecting the brainstem or cerebellum. Left cerebral hemisphere is normal. There is artifact relating to some sort of ocular or eyelid implant. There is persistent vasogenic edema within the right temporal lobe, very similar to the study 5-6 weeks ago. Some parenchymal calcification remains visible. Again the differential diagnosis is that of metastatic disease versus is radiation necrosis. No hydrocephalus. No extra-axial fluid collection. Vascular: There is atherosclerotic calcification of the major vessels at the base of the brain. Skull: Negative Sinuses/Orbits: Clear other than a retention cyst of the right maxillary sinus. Orbital or ocular implant on the right as noted above. Other: Previous right parotidectomy. IMPRESSION: Similar appearance to the study of 5-6 weeks ago. Right temporal lobe edema with some areas of calcification. The differential diagnosis is metastatic disease versus radiation necrosis, with radiation necrosis being favored. Electronically Signed   By: Nelson Chimes M.D.   On:  12/30/2020 19:25   MR Brain W and Wo Contrast  Result Date: 12/30/2020 CLINICAL DATA:  Initial evaluation for acute seizure. EXAM: MRI HEAD WITHOUT AND WITH CONTRAST TECHNIQUE: Multiplanar, multiecho pulse sequences of the brain and surrounding structures were obtained without and with intravenous contrast. CONTRAST:  77m GADAVIST GADOBUTROL 1 MMOL/ML IV SOLN COMPARISON:  Head CT from earlier the same day as well as previous MRI from 11/17/2020. FINDINGS: Brain: Previously identified enhancing masslike lesion at the anterior right temporal lobe again seen. This measures 0.8 x 1.5 x 2.5 cm on today's exam, increased in size from previous. Asymmetric enhancement involving the mastoid and tympanic segments of the right seventh cranial nerve again noted (series 22, image 21). Probable subtle asymmetric enhancement noted along the dura of the inferior right temporal lobe. Persistent associated vasogenic edema within the adjacent right temporal region  has also somewhat worsened. Edema involves the adjacent mesial right temporal lobe and right hippocampus. No more than trace right-to-left shift. No other evidence for acute or subacute infarct. Gray-white matter differentiation otherwise maintained. No other areas of chronic cortical infarction. No other evidence for acute or chronic intracranial hemorrhage. No other mass lesion or mass effect. No other abnormal enhancement. No hydrocephalus or extra-axial fluid collection. Pituitary gland suprasellar region normal. Vascular: Major intracranial vascular flow voids are maintained. Skull and upper cervical spine: Craniocervical junction within normal limits. Bone marrow signal intensity normal. 4 mm enhancing focus at the posterior right occipital calvarium noted, stable from prior (series 22, image 35). Finding is nonspecific, but favored to be benign. No other focal marrow replacing lesion. Sinuses/Orbits: Globes orbital soft tissues demonstrate no acute finding. Right  maxillary sinus retention cyst noted. Paranasal sinuses are otherwise largely clear. Right mastoid effusion. Other: Sequelae of prior right parotid resection partially visualized. IMPRESSION: 1. Interval increase in size of enhancing lesion at the anterior right temporal lobe, now measuring 0.8 x 1.5 x 2.5 cm. Associated vasogenic edema within the adjacent right temporal region has also slightly worsened. 2. No other acute intracranial abnormality. 3. Right mastoid effusion. Results were discussed by telephone at the time of interpretation on 12/30/2020 at 10:09 pm to provider Dr. Curly Shores. Electronically Signed   By: Jeannine Boga M.D.   On: 12/30/2020 22:19   Overnight EEG with video  Result Date: 12/31/2020 Lora Havens, MD     01/01/2021  9:02 AM Patient Name: Sharya Headen MRN: [id number] Epilepsy Attending: Lora Havens Referring Physician/Provider: Dr Lesleigh Noe Duration: 12/31/2020 0209 to 01/01/2021 0209  Patient history: 59 year old female with history of parotid gland tumor involving right facial nerve status postresection and radiation, right temporal brain lesion, focal seizures with secondary generalization who presented with spells described as goosebumps on her arms and legs, feeling of anxiety in her chest that is not tightness associated with intense emotional feeling and crying lasting about 30 to 60 seconds occurring 3-4 times an hour.  EEG to evaluate for seizures.  Level of alertness: Awake, asleep  AEDs during EEG study: LEV, PHT  Technical aspects: This EEG study was done with scalp electrodes positioned according to the 10-20 International system of electrode placement. Electrical activity was acquired at a sampling rate of '500Hz'$  and reviewed with a high frequency filter of '70Hz'$  and a low frequency filter of '1Hz'$ . EEG data were recorded continuously and digitally stored.  Description: The posterior dominant rhythm consists of 8 Hz activity of moderate voltage (25-35 uV) seen  predominantly in posterior head regions, symmetric and reactive to eye opening and eye closing. Sleep was characterized by vertex waves, sleep spindles (12 to 14 Hz), maximal frontocentral region.  Hyperventilation and photic stimulation were not performed.    IMPRESSION: This study is within normal limits. No seizures or epileptiform discharges were seen throughout the recording.  Priyanka Hubert Azure, MD  [facility] 01/01/2021  If 7PM-7AM, please contact night-coverage

## 2021-01-01 NOTE — Progress Notes (Signed)
LTM maintenance completed; no skin breakdown was seen.

## 2021-01-01 NOTE — Plan of Care (Signed)
Neurology Plan of Care  Neurology attending provider, Dr. Rory Percy at bedside with NP. Discussed plan of care with patient.   LTM EEG 8/13 within normal limits without seizures or epileptiform discharges.  - Agree with continuing steroids - Continue antiepileptics   - Follow up with outpatient neurology at [facility] in 4-6 weeks  - Consider a low dose of lorazepam PO PRN for anxiety symptoms while on steroid treatment   Anibal Henderson, [place] [facility] [phone]

## 2021-01-02 DIAGNOSIS — G9389 Other specified disorders of brain: Secondary | ICD-10-CM

## 2021-01-02 LAB — BASIC METABOLIC PANEL
Anion gap: 8 (ref 5–15)
BUN: 8 mg/dL (ref 6–20)
CO2: 26 mmol/L (ref 22–32)
Calcium: 8.9 mg/dL (ref 8.9–10.3)
Chloride: 104 mmol/L (ref 98–111)
Creatinine, Ser: 0.49 mg/dL (ref 0.44–1.00)
GFR, Estimated: >60 mL/min (ref >60)
Glucose, Bld: 111 mg/dL — ABNORMAL HIGH (ref 70–99)
Potassium: 3.7 mmol/L (ref 3.5–5.1)
Sodium: 138 mmol/L (ref 135–145)

## 2021-01-02 LAB — CBC
HCT: 37.1 % (ref 36.0–46.0)
Hemoglobin: 12.3 g/dL (ref 12.0–15.0)
MCH: 30.3 pg (ref 26.0–34.0)
MCHC: 33.2 g/dL (ref 30.0–36.0)
MCV: 91.4 fL (ref 80.0–100.0)
Platelets: 222 10*3/uL (ref 150–400)
RBC: 4.06 MIL/uL (ref 3.87–5.11)
RDW: 13.1 % (ref 11.5–15.5)
WBC: 5.1 10*3/uL (ref 4.0–10.5)
nRBC: 0 % (ref 0.0–0.2)

## 2021-01-02 LAB — MAGNESIUM: Magnesium: 2 mg/dL (ref 1.7–2.4)

## 2021-01-02 MED ORDER — DEXAMETHASONE 4 MG PO TABS: 4.0000 mg | ORAL_TABLET | Freq: Four times a day (QID) | ORAL | 0 refills | Status: DC

## 2021-01-02 MED ORDER — PHENYTOIN SODIUM EXTENDED 30 MG PO CAPS: 30.0000 mg | ORAL_CAPSULE | Freq: Three times a day (TID) | ORAL | 2 refills | Status: DC

## 2021-01-02 NOTE — Evaluation (Signed)
Physical Therapy Evaluation Patient Details Name: Melissa Case MRN: [id number] DOB: 1960/12/28 Today's Date: 01/02/2021   History of Present Illness  Pt is a 60 y/o female admitted secondary to abnormal sensation throughout her body. Pt was found to have right temporal lobe radiation necrosis with secondary edema and irritation. PMH including but not limited to parotic gland cancer in 2021 with metastasis, facial paralysis, depression, anxiety, seizure, GERD.   Clinical Impression  Pt presented supine in bed with HOB elevated, awake and willing to participate in therapy session. Prior to admission, pt reported that she was independent with all functional mobility and ADLs. Pt lives with her spouse in a single level home with a few steps to enter. At the time of evaluation, pt moving very well overall at supervision to independent level with all functional mobility including hallway ambulation and stair training. No balance deficits were noted and pt reported that she was at her baseline in regards to functional mobility. No further acute PT needs identified at this time. PT signing off.     Follow Up Recommendations No PT follow up    Equipment Recommendations  None recommended by PT    Recommendations for Other Services       Precautions / Restrictions Precautions Precautions: Fall Restrictions Weight Bearing Restrictions: No      Mobility  Bed Mobility Overal bed mobility: Independent                  Transfers Overall transfer level: Independent                  Ambulation/Gait Ambulation/Gait assistance: Supervision Gait Distance (Feet): 200 Feet Assistive device: IV Pole Gait Pattern/deviations: Step-through pattern Gait velocity: WFL   General Gait Details: pt steady overall, preferring to manage IV pole but not reliant on it  Stairs Stairs: Yes Stairs assistance: Supervision Stair Management: One rail Right;Alternating pattern;Forwards Number of  Stairs: 2 General stair comments: pt steady with no LOB or need for assistance  Wheelchair Mobility    Modified Rankin (Stroke Patients Only) Modified Rankin (Stroke Patients Only) Pre-Morbid Rankin Score: No symptoms Modified Rankin: No symptoms     Balance Overall balance assessment: No apparent balance deficits (not formally assessed)                                           Pertinent Vitals/Pain Pain Assessment: No/denies pain    Home Living Family/patient expects to be discharged to:: Private residence Living Arrangements: Spouse/significant other Available Help at Discharge: Family;Available 24 hours/day Type of Home: House Home Access: Stairs to enter Entrance Stairs-Rails: Chemical engineer of Steps: 3 Home Layout: One level Home Equipment: Cane - single point      Prior Function Level of Independence: Independent               Hand Dominance        Extremity/Trunk Assessment   Upper Extremity Assessment Upper Extremity Assessment: Overall WFL for tasks assessed    Lower Extremity Assessment Lower Extremity Assessment: Overall WFL for tasks assessed    Cervical / Trunk Assessment Cervical / Trunk Assessment: Normal  Communication   Communication: No difficulties  Cognition Arousal/Alertness: Awake/alert Behavior During Therapy: WFL for tasks assessed/performed Overall Cognitive Status: Within Functional Limits for tasks assessed  General Comments      Exercises     Assessment/Plan    PT Assessment Patent does not need any further PT services  PT Problem List         PT Treatment Interventions      PT Goals (Current goals can be found in the Care Plan section)  Acute Rehab PT Goals Patient Stated Goal: "home today" PT Goal Formulation: All assessment and education complete, DC therapy    Frequency     Barriers to discharge         Co-evaluation               AM-PAC PT "6 Clicks" Mobility  Outcome Measure Help needed turning from your back to your side while in a flat bed without using bedrails?: None Help needed moving from lying on your back to sitting on the side of a flat bed without using bedrails?: None Help needed moving to and from a bed to a chair (including a wheelchair)?: None Help needed standing up from a chair using your arms (e.g., wheelchair or bedside chair)?: None Help needed to walk in hospital room?: None Help needed climbing 3-5 steps with a railing? : None 6 Click Score: 24    End of Session   Activity Tolerance: Patient tolerated treatment well Patient left: in bed;with call bell/phone within reach;with family/visitor present Nurse Communication: Mobility status PT Visit Diagnosis: Other abnormalities of gait and mobility (R26.89)    Time: [id number] PT Time Calculation (min) (ACUTE ONLY): 10 min   Charges:   PT Evaluation $PT Eval Low Complexity: 1 Low          Eduard Clos, PT, DPT  Acute Rehabilitation Services Office [place] 01/02/2021, 11:00 AM

## 2021-01-02 NOTE — Plan of Care (Signed)

## 2021-01-02 NOTE — Discharge Summary (Signed)
Physician Discharge Summary  Melissa Case OZH:[id number] DOB: 19-Jun-1960 DOA: 12/30/2020  PCP: Marisue Brooklyn, DO  Admit date: 12/30/2020 Discharge date: 01/02/2021  Admitted From: Home  Discharge disposition: Home   Recommendations for Outpatient Follow-Up:   Follow up with your primary care provider in one week.  Patient will need to follow-up with neurosurgery Dr Dawley as has been scheduled and neurology in 4 to 6 weeks as outpatient Check CBC, BMP, magnesium in the next visit   Discharge Diagnosis:   Principal Problem:   Cerebral edema (HCC) Active Problems:   Seizure (HCC)   GERD (gastroesophageal reflux disease)   Mass of temporal lobe  Discharge Condition: Improved.  Diet recommendation: Low sodium, heart healthy.    Wound care: None.  Code status: Full.  History of Present Illness:   Melissa Case is a 60 y.o. female with past medical history of parotid gland cancer with metastasis, facial paralysis, depression, anxiety, seizure, GERD presented to the hospital with abnormal sensation in her body for the last 2 to 4 days with goosebumps lasting for around 15 seconds or more associated with anxiety during these episodes.  Of note patient was recently admitted to the hospital for atypical seizure which was proven through EEG.  During that time patient had an MRI of the brain which showed enhancing lesion with vasogenic edema consistent with postradiation necrosis.  She was evaluated by neurology and neurosurgery at that time and neurosurgery recommended repeat MRI in patient was started on antiepileptics.  In the ED, vitals were stable.  CT head scan showed similar findings to 2weeks back with temporal lobe edema with areas of calcification.  MR brain was also repeated which showed mild increase in size of the enhancing lesion to 0.8 x 1.5 x 2.5 up from 0.8 x 1.4 x 2.4 during previous admission.  There is also associated vasogenic edema once again demonstrated which is  more significant than previously.  Patient started on Keppra and phenytoin in the ED as well as Decadron.  Neurology was consulted and patient was put on long-term EEG monitoring and was admitted to the hospital.  Hospital Course:   Following conditions were addressed during hospitalization as listed below,  Cerebral edema/Seizure disorder/Temporal mass Patient presented with abnormal sensation in the body with goosebumps.  Question of atypical seizures.  Lesion over the anterior temporal lobe suspicious for radiation necrosis with vasogenic edema.  Neurosurgery has seen the patient at this time and recommend discussing in tumor board on Monday and recommended continuation of steroids at this time.  Neurosurgery recommended prolonged steroids.  Patient significantly improved with her symptoms during hospitalization.  Neurology also saw the patient during hospitalization and dose of phenytoin was increased to 130 mg TID from 100 mg PO TID, she will also continue Keppra on discharge.  Neurosurgery saw the patient and recommended disposition home.   History of parotid cancer in 2021 Has had treatment for mets in the past.   GERD Continue PPI from home   Depression and anxiety - Continue home paroxetine   Disposition.  At this time, patient is stable for disposition home with outpatient PCP, neurosurgery and neurology follow-up.  Spoke with the patient's family at bedside.  Medical Consultants:   Neurosurgery Neurology  Procedures:    EEG Subjective:   Today, patient was seen and examined at bedside.  Feels much better with her symptoms.  Denies any nausea vomiting fever or chills.  Discharge Exam:   Vitals:   01/02/21 0320 01/02/21 6295  BP: (!) 127/54 101/63  Pulse: 72 79  Resp: 17 18  Temp: 98.1 F (36.7 C) 99.2 F (37.3 C)  SpO2: 97% 95%   Vitals:   01/01/21 2007 01/02/21 0000 01/02/21 0320 01/02/21 0801  BP: 118/64 118/68 (!) 127/54 101/63  Pulse: 75 72 72 79  Resp:  16 15 17 18   Temp: 98.6 F (37 C) 98.1 F (36.7 C) 98.1 F (36.7 C) 99.2 F (37.3 C)  TempSrc: Oral Oral Oral Oral  SpO2: 96% 97% 97% 95%  Weight:      Height:        General: Alert awake, not in obvious distress HENT: pupils equally reacting to light,  No scleral pallor or icterus noted. Oral mucosa is moist.  Chest:  Clear breath sounds.  Diminished breath sounds bilaterally. No crackles or wheezes.  CVS: S1 &S2 heard. No murmur.  Regular rate and rhythm. Abdomen: Soft, nontender, nondistended.  Bowel sounds are heard.   Extremities: No cyanosis, clubbing or edema.  Peripheral pulses are palpable. Psych: Alert, awake and oriented, normal mood CNS: Right facial paralysis.  Power equal in all extremities.   Skin: Warm and dry.  No rashes noted.  The results of significant diagnostics from this hospitalization (including imaging, microbiology, ancillary and laboratory) are listed below for reference.     Diagnostic Studies:   CT HEAD WO CONTRAST ( )  Result Date: 12/30/2020 CLINICAL DATA:  Seizure, nontraumatic. EXAM: CT HEAD WITHOUT CONTRAST TECHNIQUE: Contiguous axial images were obtained from the base of the skull through the vertex without intravenous contrast. COMPARISON:  11/18/2020.  11/17/2020.  06/25/2019. FINDINGS: Brain: No abnormality seen affecting the brainstem or cerebellum. Left cerebral hemisphere is normal. There is artifact relating to some sort of ocular or eyelid implant. There is persistent vasogenic edema within the right temporal lobe, very similar to the study 5-6 weeks ago. Some parenchymal calcification remains visible. Again the differential diagnosis is that of metastatic disease versus is radiation necrosis. No hydrocephalus. No extra-axial fluid collection. Vascular: There is atherosclerotic calcification of the major vessels at the base of the brain. Skull: Negative Sinuses/Orbits: Clear other than a retention cyst of the right maxillary sinus. Orbital or  ocular implant on the right as noted above. Other: Previous right parotidectomy. IMPRESSION: Similar appearance to the study of 5-6 weeks ago. Right temporal lobe edema with some areas of calcification. The differential diagnosis is metastatic disease versus radiation necrosis, with radiation necrosis being favored. Electronically Signed   By: Paulina Fusi M.D.   On: 12/30/2020 19:25   MR Brain W and Wo Contrast  Result Date: 12/30/2020 CLINICAL DATA:  Initial evaluation for acute seizure. EXAM: MRI HEAD WITHOUT AND WITH CONTRAST TECHNIQUE: Multiplanar, multiecho pulse sequences of the brain and surrounding structures were obtained without and with intravenous contrast. CONTRAST:  6mL GADAVIST GADOBUTROL 1 MMOL/ML IV SOLN COMPARISON:  Head CT from earlier the same day as well as previous MRI from 11/17/2020. FINDINGS: Brain: Previously identified enhancing masslike lesion at the anterior right temporal lobe again seen. This measures 0.8 x 1.5 x 2.5 cm on today's exam, increased in size from previous. Asymmetric enhancement involving the mastoid and tympanic segments of the right seventh cranial nerve again noted (series 22, image 21). Probable subtle asymmetric enhancement noted along the dura of the inferior right temporal lobe. Persistent associated vasogenic edema within the adjacent right temporal region has also somewhat worsened. Edema involves the adjacent mesial right temporal lobe and right hippocampus. No more than trace  right-to-left shift. No other evidence for acute or subacute infarct. Gray-white matter differentiation otherwise maintained. No other areas of chronic cortical infarction. No other evidence for acute or chronic intracranial hemorrhage. No other mass lesion or mass effect. No other abnormal enhancement. No hydrocephalus or extra-axial fluid collection. Pituitary gland suprasellar region normal. Vascular: Major intracranial vascular flow voids are maintained. Skull and upper cervical  spine: Craniocervical junction within normal limits. Bone marrow signal intensity normal. 4 mm enhancing focus at the posterior right occipital calvarium noted, stable from prior (series 22, image 35). Finding is nonspecific, but favored to be benign. No other focal marrow replacing lesion. Sinuses/Orbits: Globes orbital soft tissues demonstrate no acute finding. Right maxillary sinus retention cyst noted. Paranasal sinuses are otherwise largely clear. Right mastoid effusion. Other: Sequelae of prior right parotid resection partially visualized. IMPRESSION: 1. Interval increase in size of enhancing lesion at the anterior right temporal lobe, now measuring 0.8 x 1.5 x 2.5 cm. Associated vasogenic edema within the adjacent right temporal region has also slightly worsened. 2. No other acute intracranial abnormality. 3. Right mastoid effusion. Results were discussed by telephone at the time of interpretation on 12/30/2020 at 10:09 pm to provider Dr. Iver Nestle. Electronically Signed   By: Rise Mu M.D.   On: 12/30/2020 22:19   Overnight EEG with video  Result Date: 12/31/2020 Charlsie Quest, MD     01/01/2021  9:02 AM Patient Name: Melissa Case MRN: [id number] Epilepsy Attending: Charlsie Quest Referring Physician/Provider: Dr Brooke Dare Duration: 12/31/2020 0209 to 01/01/2021 0209  Patient history: 60 year old female with history of parotid gland tumor involving right facial nerve status postresection and radiation, right temporal brain lesion, focal seizures with secondary generalization who presented with spells described as goosebumps on her arms and legs, feeling of anxiety in her chest that is not tightness associated with intense emotional feeling and crying lasting about 30 to 60 seconds occurring 3-4 times an hour.  EEG to evaluate for seizures.  Level of alertness: Awake, asleep  AEDs during EEG study: LEV, PHT  Technical aspects: This EEG study was done with scalp electrodes positioned  according to the 10-20 International system of electrode placement. Electrical activity was acquired at a sampling rate of 500Hz  and reviewed with a high frequency filter of 70Hz  and a low frequency filter of 1Hz . EEG data were recorded continuously and digitally stored.  Description: The posterior dominant rhythm consists of 8 Hz activity of moderate voltage (25-35 uV) seen predominantly in posterior head regions, symmetric and reactive to eye opening and eye closing. Sleep was characterized by vertex waves, sleep spindles (12 to 14 Hz), maximal frontocentral region.  Hyperventilation and photic stimulation were not performed.    IMPRESSION: This study is within normal limits. No seizures or epileptiform discharges were seen throughout the recording.  Priyanka Annabelle Harman     Labs:   Basic Metabolic Panel: Recent Labs  Lab 12/30/20 1735 12/31/20 0224 01/01/21 0414 01/02/21 0401  NA 139 138 138 138  K 3.7 4.1 3.6 3.7  CL 103 104 103 104  CO2 27 26 27 26   GLUCOSE 84 111* 105* 111*  BUN 7 7 9 8   CREATININE 0.49 0.53 0.52 0.49  CALCIUM 9.1 9.4 9.1 8.9  MG  --   --  2.0 2.0  PHOS  --   --  4.5  --    GFR Estimated Creatinine Clearance: 65.4 mL/min (by C-G formula based on SCr of 0.49 mg/dL). Liver Function Tests: Recent Labs  Lab 12/30/20 1735 12/31/20 0224 01/01/21 0414  AST 26 28 25   ALT 33 36 35  ALKPHOS 86 92 79  BILITOT 0.2* 0.6 0.3  PROT 6.3* 6.4* 6.1*  ALBUMIN 3.6 3.6 3.4*   No results for input(s): LIPASE, AMYLASE in the last 168 hours. No results for input(s): AMMONIA in the last 168 hours. Coagulation profile No results for input(s): INR, PROTIME in the last 168 hours.  CBC: Recent Labs  Lab 12/30/20 1735 12/31/20 0224 01/01/21 0414 01/02/21 0401  WBC 3.8* 4.4 3.8* 5.1  NEUTROABS 2.1  --   --   --   HGB 12.5 13.1 12.6 12.3  HCT 38.2 41.2 37.3 37.1  MCV 92.7 94.5 91.6 91.4  PLT 248 257 210 222   Cardiac Enzymes: No results for input(s): CKTOTAL, CKMB,  CKMBINDEX, TROPONINI in the last 168 hours. BNP: Invalid input(s): POCBNP CBG: No results for input(s): GLUCAP in the last 168 hours. D-Dimer No results for input(s): DDIMER in the last 72 hours. Hgb A1c No results for input(s): HGBA1C in the last 72 hours. Lipid Profile No results for input(s): CHOL, HDL, LDLCALC, TRIG, CHOLHDL, LDLDIRECT in the last 72 hours. Thyroid function studies Recent Labs    12/30/20 1748  TSH 0.969   Anemia work up No results for input(s): VITAMINB12, FOLATE, FERRITIN, TIBC, IRON, RETICCTPCT in the last 72 hours. Microbiology Recent Results (from the past 240 hour(s))  Resp Panel by RT-PCR (Flu A&B, Covid) Nasopharyngeal Swab     Status: None   Collection Time: 12/30/20  9:21 PM   Specimen: Nasopharyngeal Swab; Nasopharyngeal(NP) swabs in vial transport medium  Result Value Ref Range Status   SARS Coronavirus 2 by RT PCR NEGATIVE NEGATIVE Final    Comment: (NOTE) SARS-CoV-2 target nucleic acids are NOT DETECTED.  The SARS-CoV-2 RNA is generally detectable in upper respiratory specimens during the acute phase of infection. The lowest concentration of SARS-CoV-2 viral copies this assay can detect is 138 copies/mL. A negative result does not preclude SARS-Cov-2 infection and should not be used as the sole basis for treatment or other patient management decisions. A negative result may occur with  improper specimen collection/handling, submission of specimen other than nasopharyngeal swab, presence of viral mutation(s) within the areas targeted by this assay, and inadequate number of viral copies(<138 copies/mL). A negative result must be combined with clinical observations, patient history, and epidemiological information. The expected result is Negative.  Fact Sheet for Patients:  BloggerCourse.com  Fact Sheet for Healthcare Providers:  SeriousBroker.it  This test is no t yet approved or cleared  by the [place] FDA and  has been authorized for detection and/or diagnosis of SARS-CoV-2 by FDA under an Emergency Use Authorization (EUA). This EUA will remain  in effect (meaning this test can be used) for the duration of the COVID-19 declaration under Section 564(b)(1) of the Act, 21 U.S.C.section 360bbb-3(b)(1), unless the authorization is terminated  or revoked sooner.       Influenza A by PCR NEGATIVE NEGATIVE Final   Influenza B by PCR NEGATIVE NEGATIVE Final    Comment: (NOTE) The Xpert Xpress SARS-CoV-2/FLU/RSV plus assay is intended as an aid in the diagnosis of influenza from Nasopharyngeal swab specimens and should not be used as a sole basis for treatment. Nasal washings and aspirates are unacceptable for Xpert Xpress SARS-CoV-2/FLU/RSV testing.  Fact Sheet for Patients: BloggerCourse.com  Fact Sheet for Healthcare Providers: SeriousBroker.it  This test is not yet approved or cleared by the [place] FDA  and has been authorized for detection and/or diagnosis of SARS-CoV-2 by FDA under an Emergency Use Authorization (EUA). This EUA will remain in effect (meaning this test can be used) for the duration of the COVID-19 declaration under Section 564(b)(1) of the Act, 21 U.S.C. section 360bbb-3(b)(1), unless the authorization is terminated or revoked.  Performed at [facility], [address], [place], [place] [address]      Discharge Instructions:   Discharge Instructions     Diet - low sodium heart healthy   Complete by: As directed    Discharge instructions   Complete by: As directed    Follow up with neurology in 4-6 weeks and neurosurgery Dr Jake Samples (as has been scheduled) as outpatient. Take medications as prescribed. Seek medical attention for worsening symptoms   Increase activity slowly   Complete by: As directed       Allergies as of 01/02/2021       Reactions   Morphine Itching    Penicillins Rash   Has patient had a PCN reaction causing immediate rash, facial/tongue/throat swelling, SOB or lightheadedness with hypotension: No Has patient had a PCN reaction causing severe rash involving mucus membranes or skin necrosis: No Has patient had a PCN reaction that required hospitalization No Has patient had a PCN reaction occurring within the last 10 years: No If all of the above answers are "NO", then may proceed with Cephalosporin use.        Medication List     STOP taking these medications    traZODone 50 MG tablet Commonly known as: DESYREL       TAKE these medications    acetaminophen 500 MG tablet Commonly known as: TYLENOL Take 1,000 mg by mouth every 6 (six) hours as needed for mild pain.   carboxymethylcellulose 0.5 % Soln Commonly known as: REFRESH PLUS Place 1 drop into the right eye daily as needed (dry eye).   dexamethasone 4 MG tablet Commonly known as: DECADRON Take 1 tablet (4 mg total) by mouth every 6 (six) hours.   docusate sodium 100 MG capsule Commonly known as: COLACE Take 100 mg by mouth daily as needed for mild constipation (uses once in a while).   esomeprazole 40 MG capsule Commonly known as: NEXIUM Take 1 capsule (40 mg total) by mouth daily.   levETIRAcetam 750 MG tablet Commonly known as: KEPPRA Take 1 tablet (750 mg total) by mouth 2 (two) times daily.   PARoxetine 40 MG tablet Commonly known as: PAXIL Take 1 tablet (40 mg total) by mouth daily.   phenytoin 100 MG ER capsule Commonly known as: DILANTIN Take 1 capsule (100 mg total) by mouth 3 (three) times daily. What changed: Another medication with the same name was added. Make sure you understand how and when to take each.   phenytoin 30 MG ER capsule Commonly known as: DILANTIN Take 1 capsule (30 mg total) by mouth 3 (three) times daily. What changed: You were already taking a medication with the same name, and this prescription was added. Make sure you  understand how and when to take each.        Follow-up Information     Elisabeth Most, Amy, DO Follow up.   Specialty: Internal Medicine Contact information: [address] [place] [address] [phone]         Dawley, Alan Mulder, DO. Go to.   Why: on the scheduled date Contact information: [address] [place] [address]  [phone]                  Time coordinating discharge: 39 minutes  Signed:  Amela Handley  [facility] 01/02/2021, 8:55 AM

## 2021-01-03 ENCOUNTER — Telehealth: Payer: Self-pay | Admitting: Neurology

## 2021-01-03 ENCOUNTER — Telehealth: Payer: Self-pay | Admitting: *Deleted

## 2021-01-03 LAB — LEVETIRACETAM LEVEL: Levetiracetam Lvl: 11.5 ug/mL (ref 10.0–40.0)

## 2021-01-03 NOTE — Telephone Encounter (Signed)
Returned patient's call. She wanted to make sure that the disability paperwork had been completed and sent in.    Also that she has been in the hospital for 3-4 days, had the MRI and CT scan done while she was admitted.  She didn't have a seizure, she was having 'tremors', they said it was because of her anxiety.  Also her brain lesion had gotten bigger and the edema had came back.    She is asking how soon you want to see her since the MRI has been completed and if you have read it, what are the results?  Also, to let you know her medication has been changed around:   Her dilantin was increased to 130 mg 3xday Also taking dexamethasone 4 mg every 6 hours.    Everything else was left the same.   Patient denied further questions, verbalized understanding and expressed appreciation for the phone call.

## 2021-01-03 NOTE — Telephone Encounter (Signed)
I faxed pt [place] form on 01/03/21

## 2021-01-03 NOTE — Telephone Encounter (Signed)
[employer] auth: [id number] (exp. 12/28/20 to 03/30/21).  Spoke to the patient she states she already has an MRI Brain in the hospital on 12/30/20.

## 2021-01-03 NOTE — Telephone Encounter (Signed)
Pt called, states she was just released from the ED and she had a few questions regarding her visit. Pt is requesting a call back.

## 2021-01-04 ENCOUNTER — Telehealth: Payer: Self-pay | Admitting: Neurology

## 2021-01-04 MED ORDER — LACOSAMIDE 100 MG PO TABS: 100.0000 mg | ORAL_TABLET | Freq: Two times a day (BID) | ORAL | 4 refills | Status: DC

## 2021-01-04 NOTE — Telephone Encounter (Signed)
Patient is on phenytoin which will interact with dexamethasone and decrease its effectiveness.  Will discontinue Phenytoin and start her on Lacosamide.  Message sent to patient Neurosurgeon.   Alric Ran, MD [facility]

## 2021-01-05 ENCOUNTER — Telehealth: Payer: Self-pay | Admitting: Neurology

## 2021-01-05 ENCOUNTER — Other Ambulatory Visit: Payer: Self-pay | Admitting: Neurology

## 2021-01-05 MED ORDER — LACOSAMIDE 100 MG PO TABS: 100.0000 mg | ORAL_TABLET | Freq: Two times a day (BID) | ORAL | 4 refills | Status: DC

## 2021-01-05 NOTE — Addendum Note (Signed)
Addended by: Rhae Lerner R on: 01/05/2021 02:46 PM   Modules accepted: Orders

## 2021-01-05 NOTE — Telephone Encounter (Signed)
[facility] pharmacist is asking for a call from clinical staff re: pt's Lacosamide 100 MG TABS

## 2021-01-05 NOTE — Telephone Encounter (Signed)
Pt called wanting to know if she should stay on her Dilantin for now since the pharmacy is not filling the Lacosamide. Please advise and call pt at [phone].

## 2021-01-07 NOTE — Progress Notes (Signed)
Subjective:    Melissa Case - 60 y.o. female MRN [id number]  Date of birth: Oct 22, 1960  HPI  Melissa Case is to establish care and hospital discharge follow-up.   Current issues and/or concerns: Visit 12/30/2020 - 01/02/2021 at [facility] per MD note: Recommendations for Outpatient Follow-Up:    Follow up with your primary care provider in one week.  Patient will need to follow-up with neurosurgery Dr Dawley as has been scheduled and neurology in 4 to 6 weeks as outpatient Check CBC, BMP, magnesium in the next visit     Discharge Diagnosis:    Principal Problem:   Cerebral edema ([place]) Active Problems:   Seizure ([place])   GERD (gastroesophageal reflux disease)   Mass of temporal lobe   Discharge Condition: Improved.   Diet recommendation: Low sodium, heart healthy.     Wound care: None.   Code status: Full.   History of Present Illness:    Melissa Case is a 60 y.o. female with past medical history of parotid gland cancer with metastasis, facial paralysis, depression, anxiety, seizure, GERD presented to the hospital with abnormal sensation in her body for the last 2 to 4 days with goosebumps lasting for around 15 seconds or more associated with anxiety during these episodes.  Of note patient was recently admitted to the hospital for atypical seizure which was proven through EEG.  During that time patient had an MRI of the brain which showed enhancing lesion with vasogenic edema consistent with postradiation necrosis.  She was evaluated by neurology and neurosurgery at that time and neurosurgery recommended repeat MRI in patient was started on antiepileptics.  In the ED, vitals were stable.  CT head scan showed similar findings to 2weeks back with temporal lobe edema with areas of calcification.  MR brain was also repeated which showed mild increase in size of the enhancing lesion to 0.8 x 1.5 x 2.5 up from 0.8 x 1.4 x 2.4 during previous admission.  There is  also associated vasogenic edema once again demonstrated which is more significant than previously.  Patient started on Keppra and phenytoin in the ED as well as Decadron.  Neurology was consulted and patient was put on long-term EEG monitoring and was admitted to the hospital.   Hospital Course:    Following conditions were addressed during hospitalization as listed below,   Cerebral edema/Seizure disorder/Temporal mass Patient presented with abnormal sensation in the body with goosebumps.  Question of atypical seizures.  Lesion over the anterior temporal lobe suspicious for radiation necrosis with vasogenic edema.  Neurosurgery has seen the patient at this time and recommend discussing in tumor board on Monday and recommended continuation of steroids at this time.  Neurosurgery recommended prolonged steroids.  Patient significantly improved with her symptoms during hospitalization.  Neurology also saw the patient during hospitalization and dose of phenytoin was increased to 130 mg TID from 100 mg PO TID, she will also continue Keppra on discharge.  Neurosurgery saw the patient and recommended disposition home.   History of parotid cancer in 2021 Has had treatment for mets in the past.   GERD Continue PPI from home   Depression and anxiety - Continue home paroxetine   Disposition.  At this time, patient is stable for disposition home with outpatient PCP, neurosurgery and neurology follow-up.  Spoke with the patient's family at bedside.   Medical Consultants:    Neurosurgery Neurology   Today's Visit 01/10/2021: Cerebral edema/Seizure disorder/Temporal mass follow-up: Taking Keppra and Lacosamide. Denies  any recent tremors or seizure-like activity. Still seeing Neurology most recent appointment last week. Reports Neurology will be managing seizure medication. Scheduled to see Neurosurgery soon reporting they will manage steroid therapy. Overall feeling well.  GERD follow-up: Doing well  on Nexium.  Depression and anxiety follow-up: Doing well on Paroxetine. Seeing Psychiatry about every 3 to 6 months. Next appointment in December. Denies thoughts of self-harm, suicidal ideations, and homicidal ideations.    Wt Readings from Last 3 Encounters:  01/10/21 136 lb 3.2 oz (61.8 kg)  12/30/20 140 lb (63.5 kg)  12/27/20 142 lb (64.4 kg)    ROS per HPI    Health Maintenance:  Health Maintenance Due  Topic Date Due   Hepatitis C Screening  Never done   TETANUS/TDAP  Never done   Zoster Vaccines- Shingrix (1 of 2) Never done   PAP SMEAR-Modifier  Never done   MAMMOGRAM  Never done   COVID-19 Vaccine (3 - Pfizer risk series) 10/13/2019   Pneumococcal Vaccine 90-72 Years old (3 - PCV) 07/23/2020   INFLUENZA VACCINE  12/20/2020     Past Medical History: Patient Active Problem List   Diagnosis Date Noted   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   Cerebral edema ([place]) 12/30/2020   Seizure ([place]) 11/17/2020   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 She started smoking about 44 years ago. She has a 21.00 pack-year smoking history. She has never used smokeless tobacco. She reports that she does not drink alcohol and does not use drugs.   Family History  family history includes Breast cancer in her paternal grandmother; Depression in her sister; Diabetes in her father; Heart disease in her mother.    Medications: reviewed and updated   Objective:   Physical Exam BP 119/69 (BP Location: Left Arm, Patient Position: Sitting, Cuff Size: Normal)   Pulse 71   Temp 98.4 F (36.9 C)   Resp 18   Ht 5' 4.02" (1.626 m)   Wt 136 lb 3.2 oz (61.8 kg)   SpO2 96%   BMI 23.37 kg/m  Physical Exam HENT:     Head: Normocephalic and atraumatic.  Eyes:     Extraocular Movements: Extraocular movements intact.     Conjunctiva/sclera: Conjunctivae normal.     Pupils: Pupils are equal, round, and reactive to light.  Cardiovascular:     Rate and Rhythm: Normal rate and regular rhythm.     Heart sounds: Normal heart sounds.  Pulmonary:     Effort: Pulmonary effort is normal.     Breath sounds: Normal breath sounds.  Musculoskeletal:     Cervical back: Normal range of motion and neck supple.  Neurological:     General: No focal deficit present.     Mental Status: She is alert and oriented to person, place, and time.  Psychiatric:        Mood and Affect: Mood normal.        Behavior: Behavior  normal.       Assessment & Plan:  1. Encounter to establish care: - Patient presents today to establish care.  - Return for annual physical examination, labs, and health maintenance. Arrive fasting meaning having no food for at least 8 hours prior to appointment. You may have only water or black coffee. Please take scheduled medications as normal.  2. Hospital discharge follow-up: - Reviewed hospital course, current medications, ensured proper follow-up in place, and addressed concerns.   3. Cerebral edema ([place]): 4. Mass of temporal lobe: 5. Seizure ([place]): - Continue Levetiracetam and Lacosamide as prescribed.  - Keep all appointments as scheduled with Alric Ran, MD at [facility].  - Keep all appointments as scheduled with Neurosurgery.  - Follow-up with primary provider as scheduled.   6. History of parotid cancer 2021: - Has had treatment for mets in the past.  7.  Gastroesophageal reflux disease, unspecified whether esophagitis present: - Continue Esomeprazole as prescribed.  - Follow-up with primary provider as scheduled.   8. Anxiety and depression: - Patient denies thoughts of self-harm, suicidal ideations, and homicidal ideations. - Continue Paroxetine as prescribed.  - Keep all appointments as scheduled with Berniece Andreas, MD at [facility].  - Follow-up with primary provider as scheduled.     Patient was given clear instructions to go to Emergency Department or return to medical center if symptoms don't improve, worsen, or new problems develop.The patient verbalized understanding.  I discussed the assessment and treatment plan with the patient. The patient was provided an opportunity to ask questions and all were answered. The patient agreed with the plan and demonstrated an understanding of the instructions.   The patient was advised to call back or seek an in-person evaluation if the symptoms worsen or if the condition fails to improve as anticipated.    Durene Fruits, NP 01/10/2021, 1:28 PM Primary Care at [facility]

## 2021-01-10 ENCOUNTER — Other Ambulatory Visit: Payer: Self-pay

## 2021-01-10 ENCOUNTER — Encounter: Payer: Self-pay | Admitting: Family

## 2021-01-10 ENCOUNTER — Ambulatory Visit (INDEPENDENT_AMBULATORY_CARE_PROVIDER_SITE_OTHER): Payer: PRIVATE HEALTH INSURANCE | Admitting: Family

## 2021-01-10 VITALS — BP 119/69 | HR 71 | Temp 98.4°F | Resp 18 | Ht 64.02 in | Wt 136.2 lb

## 2021-01-10 DIAGNOSIS — G936 Cerebral edema: Secondary | ICD-10-CM

## 2021-01-10 DIAGNOSIS — Z85818 Personal history of malignant neoplasm of other sites of lip, oral cavity, and pharynx: Secondary | ICD-10-CM

## 2021-01-10 DIAGNOSIS — G9389 Other specified disorders of brain: Secondary | ICD-10-CM | POA: Diagnosis not present

## 2021-01-10 DIAGNOSIS — Z7689 Persons encountering health services in other specified circumstances: Secondary | ICD-10-CM

## 2021-01-10 DIAGNOSIS — F419 Anxiety disorder, unspecified: Secondary | ICD-10-CM

## 2021-01-10 DIAGNOSIS — Z09 Encounter for follow-up examination after completed treatment for conditions other than malignant neoplasm: Secondary | ICD-10-CM | POA: Diagnosis not present

## 2021-01-10 DIAGNOSIS — F32A Depression, unspecified: Secondary | ICD-10-CM

## 2021-01-10 DIAGNOSIS — R569 Unspecified convulsions: Secondary | ICD-10-CM

## 2021-01-10 DIAGNOSIS — K219 Gastro-esophageal reflux disease without esophagitis: Secondary | ICD-10-CM

## 2021-01-10 NOTE — Progress Notes (Signed)
Pt presents to establish care and hospital follow-up from 08/11-08/14, pt reports she is doing fine

## 2021-01-10 NOTE — Patient Instructions (Signed)
Thank you for choosing Primary Care at [facility] for your medical home!    Melissa Case was seen by Camillia Herter, NP today.   Melissa Case's primary care provider is Camillia Herter, NP.   For the best care possible,  you should try to see Melissa Fruits, NP whenever you come to clinic.   We look forward to seeing you again soon!  If you have any questions about your visit today,  please call us at [phone]  Or feel free to reach your provider via [place].    Keeping you healthy   Get these tests Blood pressure- Have your blood pressure checked once a year by your healthcare provider.  Normal blood pressure is 120/80. Weight- Have your body mass index (BMI) calculated to screen for obesity.  BMI is a measure of body fat based on height and weight. You can also calculate your own BMI at [employer]velBags.it. Cholesterol- Have your cholesterol checked regularly starting at age 60, sooner may be necessary if you have diabetes, high blood pressure, if a family member developed heart diseases at an early age or if you smoke.  Chlamydia, HIV, and other sexual transmitted disease- Get screened each year until the age of 74 then within three months of each new sexual partner. Diabetes- Have your blood sugar checked regularly if you have high blood pressure, high cholesterol, a family history of diabetes or if you are overweight.   Get these vaccines Flu shot- Every fall. Tetanus shot- Every 10 years. Menactra- Single dose; prevents meningitis.   Take these steps Don't smoke- If you do smoke, ask your healthcare provider about quitting. For tips on how to quit, go to www.smokefree.gov or call 1-800-QUIT-NOW. Be physically active- Exercise 5 days a week for at least 30 minutes.  If you are not already physically active start slow and gradually work up to 30 minutes of moderate physical activity.  Examples of moderate activity include walking briskly, mowing the yard, dancing,  swimming bicycling, etc. Eat a healthy diet- Eat a variety of healthy foods such as Case, vegetables, low fat milk, low fat cheese, yogurt, lean meats, poultry, fish, beans, tofu, etc.  For more information on healthy eating, go to www.thenutritionsource.org Drink alcohol in moderation- Limit alcohol intake two drinks or less a day.  Never drink and drive. Dentist- Brush and floss teeth twice daily; visit your dentis twice a year. Depression-Your emotional health is as important as your physical health.  If you're feeling down, losing interest in things you normally enjoy please talk with your healthcare provider. Gun Safety- If you keep a gun in your home, keep it unloaded and with the safety lock on.  Bullets should be stored separately. Helmet use- Always wear a helmet when riding a motorcycle, bicycle, rollerblading or skateboarding. Safe sex- If you may be exposed to a sexually transmitted infection, use a condom Seat belts- Seat bels can save your life; always wear one. Smoke/Carbon Monoxide detectors- These detectors need to be installed on the appropriate level of your home.  Replace batteries at least once a year. Skin Cancer- When out in the sun, cover up and use sunscreen SPF 15 or higher. Violence- If anyone is threatening or hurting you, please tell your healthcare provider.

## 2021-01-12 ENCOUNTER — Telehealth: Payer: Self-pay | Admitting: Neurology

## 2021-01-12 ENCOUNTER — Encounter: Payer: Self-pay | Admitting: Neurology

## 2021-01-12 NOTE — Telephone Encounter (Signed)
Pt called wanting to know if she is needing to come in to get her back to work letter. Please advise.

## 2021-01-12 NOTE — Telephone Encounter (Signed)
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 She would like a note with restrictions on the number of hours she works: no more than 8 hours each shift, no more than 40 hours each week. She would like this note to be valid until her next follow up visit with you on 03/28/21.    At work, says she does not climb ladders, use power tools or operate heavy machinery. She is not driving at this time.   She would like to pick the note up from our office this week, if possible.

## 2021-01-12 NOTE — Telephone Encounter (Signed)
Letter signed by MD and placed up front for pick up.

## 2021-02-08 ENCOUNTER — Telehealth (HOSPITAL_COMMUNITY): Payer: Self-pay

## 2021-02-09 NOTE — Telephone Encounter (Signed)
error 

## 2021-02-20 ENCOUNTER — Encounter (HOSPITAL_COMMUNITY): Payer: Self-pay

## 2021-02-20 ENCOUNTER — Emergency Department (HOSPITAL_COMMUNITY)
Admission: EM | Admit: 2021-02-20 | Discharge: 2021-02-20 | Disposition: A | Discharge status: Home / Self Care | Payer: PRIVATE HEALTH INSURANCE | Attending: Emergency Medicine | Admitting: Emergency Medicine

## 2021-02-20 ENCOUNTER — Emergency Department (HOSPITAL_COMMUNITY): Payer: PRIVATE HEALTH INSURANCE

## 2021-02-20 DIAGNOSIS — E785 Hyperlipidemia, unspecified: Secondary | ICD-10-CM | POA: Insufficient documentation

## 2021-02-20 DIAGNOSIS — Z85858 Personal history of malignant neoplasm of other endocrine glands: Secondary | ICD-10-CM | POA: Diagnosis not present

## 2021-02-20 DIAGNOSIS — F17210 Nicotine dependence, cigarettes, uncomplicated: Secondary | ICD-10-CM | POA: Insufficient documentation

## 2021-02-20 DIAGNOSIS — G40909 Epilepsy, unspecified, not intractable, without status epilepticus: Secondary | ICD-10-CM | POA: Insufficient documentation

## 2021-02-20 DIAGNOSIS — R4182 Altered mental status, unspecified: Secondary | ICD-10-CM | POA: Diagnosis present

## 2021-02-20 LAB — CBC WITH DIFFERENTIAL/PLATELET
Abs Immature Granulocytes: 0.72 10*3/uL — ABNORMAL HIGH (ref 0.00–0.07)
Basophils Absolute: 0.1 10*3/uL (ref 0.0–0.1)
Basophils Relative: 1 %
Eosinophils Absolute: 0 10*3/uL (ref 0.0–0.5)
Eosinophils Relative: 0 %
HCT: 40.8 % (ref 36.0–46.0)
Hemoglobin: 13.7 g/dL (ref 12.0–15.0)
Immature Granulocytes: 9 %
Lymphocytes Relative: 11 %
Lymphs Abs: 0.9 10*3/uL (ref 0.7–4.0)
MCH: 31.1 pg (ref 26.0–34.0)
MCHC: 33.6 g/dL (ref 30.0–36.0)
MCV: 92.5 fL (ref 80.0–100.0)
Monocytes Absolute: 0.5 10*3/uL (ref 0.1–1.0)
Monocytes Relative: 7 %
Neutro Abs: 5.5 10*3/uL (ref 1.7–7.7)
Neutrophils Relative %: 72 %
Platelets: 180 10*3/uL (ref 150–400)
RBC: 4.41 MIL/uL (ref 3.87–5.11)
RDW: 15 % (ref 11.5–15.5)
WBC: 7.7 10*3/uL (ref 4.0–10.5)
nRBC: 0 % (ref 0.0–0.2)

## 2021-02-20 LAB — COMPREHENSIVE METABOLIC PANEL
ALT: 51 U/L — ABNORMAL HIGH (ref 0–44)
AST: 25 U/L (ref 15–41)
Albumin: 3.2 g/dL — ABNORMAL LOW (ref 3.5–5.0)
Alkaline Phosphatase: 35 U/L — ABNORMAL LOW (ref 38–126)
Anion gap: 10 (ref 5–15)
BUN: 11 mg/dL (ref 6–20)
CO2: 28 mmol/L (ref 22–32)
Calcium: 8.9 mg/dL (ref 8.9–10.3)
Chloride: 98 mmol/L (ref 98–111)
Creatinine, Ser: 0.52 mg/dL (ref 0.44–1.00)
GFR, Estimated: >60 mL/min (ref >60)
Glucose, Bld: 95 mg/dL (ref 70–99)
Potassium: 4.1 mmol/L (ref 3.5–5.1)
Sodium: 136 mmol/L (ref 135–145)
Total Bilirubin: 0.4 mg/dL (ref 0.3–1.2)
Total Protein: 5.6 g/dL — ABNORMAL LOW (ref 6.5–8.1)

## 2021-02-20 LAB — I-STAT BETA HCG BLOOD, ED (MC, WL, AP ONLY): I-stat hCG, quantitative: <5 m[IU]/mL (ref <5)

## 2021-02-20 LAB — CBG MONITORING, ED: Glucose-Capillary: 93 mg/dL (ref 70–99)

## 2021-02-20 NOTE — ED Triage Notes (Signed)
Pt is here for AMS. Found outside of workplace by a coworker. Pt thought her husband was going to pick her up. Pt also thought she was at home when she was actually at work. Hx of seizure 2 months ago and was started on Keppra secondary to swelling of brain d/t radiation therapy. Hx of parotid cancer.

## 2021-02-20 NOTE — ED Provider Notes (Signed)
Patient's husband is now present and reports that she is back at her baseline.  She will need follow-up with neurology.  Keppra level is pending.  Patient was able to ambulate to the bathroom with no difficulty.  Patient is otherwise stable for discharge.   Blanchie Dessert, MD 02/20/21 [phone]

## 2021-02-20 NOTE — Discharge Instructions (Signed)
Continue taking the keppra and the steroids.  Call the neurologist tomorrow to let them know you are in the emergency room because we think she had a seizure today.  Make sure she gets plenty of rest the rest of today.  She should not be driving or taking baths or be in swimming pools alone.  The lab work today otherwise looks good there is no sign of infection or dehydration.  The CAT scan shows that the swelling in her brain is somewhat improved from August.  If she has any further episodes of seizure or she starts vomiting and cannot stop or starts running fevers you should return to the emergency room.

## 2021-02-20 NOTE — ED Provider Notes (Signed)
[facility] EMERGENCY DEPARTMENT Provider Note   CSN: [id number] Arrival date & time: 02/20/21  1352     History Chief Complaint  Patient presents with   Altered Mental Status    Melissa Case is a 60 y.o. female.   Altered Mental Status  Patient presented to the ED for evaluation of an episode of possible seizure or confusion.  Patient was at work today.  She states she stepped outside and had an episode where she was shaking.  She did not feel like she lost consciousness.  Coworkers found the patient outside.  Patient thought her husband was going to pick her up and she apparently also thought she was at home when she was still at work.  Patient states she is feeling better now.  Patient does have a history of seizure disorder.  She is taking Keppra.  Patient states she has has been compliant with her medications.  Right now she does not have a headache.  She denies any fevers or chills.  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   Lesion of parotid gland 07/14/2019   Acute calculous cholecystitis 11/21/2017   Gastro-esophageal reflux disease without esophagitis 02/03/2014   Abdominal pain 09/30/2013   SBO (small bowel obstruction) ([place]) 09/30/2013   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/05/2012   Gall stone 04/05/2012   Barrett's esophagus 04/05/2012   Cholelithiasis 03/27/2012   Hyperlipidemia 03/25/2012   Depression 09/15/2011   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    Past Surgical History:  Procedure Laterality Date   ABDOMINAL HYSTERECTOMY     CHOLECYSTECTOMY N/A 11/21/2017   Procedure:  LAPAROSCOPIC CHOLECYSTECTOMY WITH INTRAOPERATIVE CHOLANGIOGRAM;  Surgeon: Jovita Kussmaul, MD;  Location: [facility];  Service: General;  Laterality: N/A;   MULTIPLE EXTRACTIONS WITH ALVEOLOPLASTY N/A 08/28/2019   Procedure: Extraction of tooth #'s 3-7, 10-15, and 20-31 with alveoloplatsy, maxillary right buccal exostosis reduction, and bilateral mandibular tori reductions.;  Surgeon: Lenn Cal, DDS;  Location: [facility];  Service: Oral Surgery;  Laterality: N/A;   PAROTIDECTOMY Right 07/22/2019   with biopsy; done at [facility] by Dr. Fredricka Bonine     OB History   No obstetric history on file.     Family History  Problem Relation Age of Onset   Depression Sister    Heart disease Mother    Diabetes Father    Breast cancer Paternal Grandmother    Colon cancer Neg Hx    Pancreatic cancer Neg Hx    Esophageal cancer Neg Hx    Stomach cancer Neg Hx    Rectal cancer Neg Hx    Liver cancer Neg Hx     Social History   Tobacco Use   Smoking status: Every Day    Packs/day: 0.50    Years: 42.00    Pack years: 21.00    Types: Cigarettes    Start date: 1978   Smokeless tobacco: Never  Vaping Use   Vaping Use: Never used  Substance Use Topics   Alcohol use: No    Alcohol/week: 0.0 standard drinks  Drug use: No    Home Medications Prior to Admission medications   Medication Sig Start Date End Date Taking? Authorizing Provider  acetaminophen (TYLENOL) 500 MG tablet Take 1,000 mg by mouth every 6 (six) hours as needed for mild pain.    [provider]  carboxymethylcellulose (REFRESH PLUS) 0.5 % SOLN Place 1 drop into the right eye daily as needed (dry eye).     [provider]  dexamethasone (DECADRON) 4 MG tablet Take 1 tablet (4 mg total) by mouth every 6 (six) hours. 01/02/21   Pokhrel, Corrie Mckusick, MD  docusate sodium (COLACE) 100 MG capsule Take 100 mg by mouth daily as needed for mild constipation (uses once in a while).    [provider]  esomeprazole  (NEXIUM) 40 MG capsule Take 1 capsule (40 mg total) by mouth daily. 05/28/20   Thornton Park, MD  Lacosamide 100 MG TABS Take 1 tablet (100 mg total) by mouth in the morning and at bedtime. 01/05/21 04/05/21  Melvenia Beam, MD  levETIRAcetam (KEPPRA) 750 MG tablet Take 1 tablet (750 mg total) by mouth 2 (two) times daily. 12/27/20   Alric Ran, MD  PARoxetine (PAXIL) 40 MG tablet Take 1 tablet (40 mg total) by mouth daily. 10/26/20   Kathlee Nations, MD    Allergies    Morphine and Penicillins  Review of Systems   Review of Systems  All other systems reviewed and are negative.  Physical Exam Updated Vital Signs BP (!) 143/82   Pulse 78   Temp 98.4 F (36.9 C) (Oral)   Resp (!) 22   Ht 1.626 m (5\' 4" )   Wt 61.7 kg   SpO2 97%   BMI 23.34 kg/m   Physical Exam Vitals and nursing note reviewed.  Constitutional:      General: She is not in acute distress.    Appearance: She is well-developed.  HENT:     Head: Normocephalic.     Comments: Surgical deformity associated with right parotid cancer    Right Ear: External ear normal.     Left Ear: External ear normal.  Eyes:     General: No scleral icterus.       Right eye: No discharge.        Left eye: No discharge.     Conjunctiva/sclera: Conjunctivae normal.  Neck:     Trachea: No tracheal deviation.  Cardiovascular:     Rate and Rhythm: Normal rate and regular rhythm.  Pulmonary:     Effort: Pulmonary effort is normal. No respiratory distress.     Breath sounds: Normal breath sounds. No stridor. No wheezing or rales.  Abdominal:     General: Bowel sounds are normal. There is no distension.     Palpations: Abdomen is soft.     Tenderness: There is no abdominal tenderness. There is no guarding or rebound.  Musculoskeletal:        General: No tenderness or deformity.     Cervical back: Neck supple.  Skin:    General: Skin is warm and dry.     Findings: No rash.  Neurological:     General: No focal deficit present.      Mental Status: She is alert and oriented to person, place, and time.     Cranial Nerves: No cranial nerve deficit (no facial droop, extraocular movements intact, no slurred speech).     Sensory: No sensory deficit.     Motor: No abnormal muscle tone or seizure activity.  Coordination: Coordination normal.  Psychiatric:        Mood and Affect: Mood normal.    ED Results / Procedures / Treatments   Labs (all labs ordered are listed, but only abnormal results are displayed) Labs Reviewed  CBC WITH DIFFERENTIAL/PLATELET  COMPREHENSIVE METABOLIC PANEL  LEVETIRACETAM LEVEL  CBG MONITORING, ED  I-STAT BETA HCG BLOOD, ED ([facility], [facility], [facility] ONLY)    EKG EKG Interpretation  Date/Time:  Sunday February 20 2021 13:59:35 EDT Ventricular Rate:  76 PR Interval:  175 QRS Duration: 88 QT Interval:  372 QTC Calculation: 419 R Axis:   -13 Text Interpretation: Sinus rhythm Artifact No significant change since last tracing Confirmed by Dorie Rank [phone]) on 02/20/2021 2:02:00 PM  Radiology No results found.  Procedures Procedures   Medications Ordered in ED Medications - No data to display  ED Course  I have reviewed the triage vital signs and the nursing notes.  Pertinent labs & imaging results that were available during my care of the patient were reviewed by me and considered in my medical decision making (see chart for details).    MDM Rules/Calculators/A&P                           Patient presents to the ED with complaints of confusion.  She does have history of previous seizures.  She also has history of cerebral edema associated with radiation treatment.  We will proceed with CT scan and laboratory tests.  Currently the patient is alert and oriented.  She is not confused.  Certainly possible that she might of had a seizure and was postictal.  Care turned over to oncoming team.  Final Clinical Impression(s) / ED Diagnoses Final diagnoses:  Seizure disorder [facility])     Dorie Rank, MD 02/20/21 1514

## 2021-02-22 LAB — LEVETIRACETAM LEVEL: Levetiracetam Lvl: 10.4 ug/mL (ref 10.0–40.0)

## 2021-02-23 ENCOUNTER — Encounter: Payer: Self-pay | Admitting: Neurology

## 2021-02-23 ENCOUNTER — Ambulatory Visit (INDEPENDENT_AMBULATORY_CARE_PROVIDER_SITE_OTHER): Payer: No Typology Code available for payment source | Admitting: Neurology

## 2021-02-23 VITALS — BP 158/79 | HR 80 | Ht 64.0 in | Wt 156.5 lb

## 2021-02-23 DIAGNOSIS — G9389 Other specified disorders of brain: Secondary | ICD-10-CM | POA: Diagnosis not present

## 2021-02-23 DIAGNOSIS — R569 Unspecified convulsions: Secondary | ICD-10-CM | POA: Diagnosis not present

## 2021-02-23 DIAGNOSIS — C07 Malignant neoplasm of parotid gland: Secondary | ICD-10-CM | POA: Diagnosis not present

## 2021-02-23 DIAGNOSIS — G936 Cerebral edema: Secondary | ICD-10-CM

## 2021-02-23 DIAGNOSIS — G510 Bell's palsy: Secondary | ICD-10-CM | POA: Diagnosis not present

## 2021-02-23 MED ORDER — LEVETIRACETAM 1000 MG PO TABS: 1000.0000 mg | ORAL_TABLET | Freq: Two times a day (BID) | ORAL | 3 refills | Status: DC

## 2021-02-23 NOTE — Progress Notes (Signed)
[facility]  PATIENT: Melissa Case DOB: [birth year]  REFERRING CLINICIAN: Camillia Herter, NP HISTORY FROM: Patient and Husband  REASON FOR VISIT: Seizure    HISTORICAL  CHIEF COMPLAINT:  Chief Complaint  Patient presents with   Seizures    Followup: Seizures, last seizure 02/20/2021, frequency has decreased overall New Room, husband richard in Room   INTERVAL HISTORY 02/23/2021:  Patient presents today for follow-up with her husband.  Since last visit she has presented to the ED for additional seizures, MRI done at that time showed worsening of the edema despite dexamethasone.  She was also at that time on Keppra and phenytoin.  I discontinued the phenytoin (enzyme inducer) and started her on lacosamide 100 mg twice daily.  Since being on this current regimen husband has noted possible minor seizures due to the fact that she is confused time.  He reported the last major seizure was on October 2 when she was at work.  She was missing of work and was found an hour or hour and a half later wandering in the parking lot.  She presented to the ED where she had a head CT showing slight improvement of the edema.  A Keppra level was obtained and it was 10.4.  Lacosamide level was not obtained.  She reported compliance with her medication.  She has been working but on limited duty but due to recent seizure and wandering outside the parking lot, husband was asking if she can be out of work until her condition improves.  He denies any side effect from the medication.      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  Patient stated she had a first lifetime seizure on June 7 of this year.  Per husband, on the day she was not acting like her normal self.  She was having trouble with her right hand.  Husband decided to call  911 and soon after he helped her to the floor because she seems like she was falling. While on the ground she had a generalized shaking episode lasting about a minute and followed by sleepiness and tiredness.  She was taken to [facility] where she was admitted for further workup.  Patient states that she does not remember the seizures and does not remember how she got in the hospital. She was having memory issues in the hospital. In the hospital she was on hooked up on EEG which showed subclinical seizures coming from the right temporoparietal region.  She was started on levetiracetam and phenytoin.    Patient also had a MRI brain which showed a 2.4 x 1.4 x 0.8 cm peripherally enhancing focus within the inferior right temporal lobe which was new compared to the previous study.  There was also moderate vasogenic edema within the right temporal lobe She was also seen by neurosurgery who recommended a repeat MRI in 2 to 3 months and she was also started on Decadron for vasogenic edema.  Patient reported since leaving the hospital she has not had any additional seizures.  She is on Keppra 1000 mg twice a day and phenytoin 100 mg 3 times a day.  She reports side effects including increase daytime sleepiness, some nausea and also increase anxiety.  She feels like she has to do adjust to the medication and the new diagnosis.  Denies  any mood swing denies any dizziness, no falls   Seizure Type:  Possible focal seizure  Current frequency: only one time on June 7   Seizure risk factors: parotid gland tumors s/p radiation, no family seizure history.   Previous ASMs: None   Currenty ASMs: Levetiracetam, Phenytoin  Side effects: Sleepiness, nausea, anxiety   Brain images:  MRI Brain:  2.4 x 1.4 x 0.8 cm peripherally enhancing focus within the inferior right temporal lobe, new as compared to the brain MRI of 06/25/2019. Also new from this prior exam, there is moderate vasogenic edema within  the right temporal lobe, extending to the right temporal stem. These findings may reflect necrosis and edema related to prior radiation therapy. However, a right temporal lobe metastasis cannot be excluded. Neuro-Oncology consultation is recommended.   Diffusion-weighted signal abnormality within the cortex of the anterior right temporal lobe, within the right hippocampus and within the medial right thalamus. Findings are nonspecific. Primary considerations are encephalitis (including herpes encephalitis), seizure-related changes or acute ischemia.   Sequela of interval right parotid resection. Persistent asymmetric enhancement of the mastoid segment of the right facial nerve. Residual perineural tumor at this site cannot be excluded.   New from the prior brain MRI, there is subtle asymmetric enhancement along the inferomedial aspect of the right middle cranial fossa, which could reflect additional perineural tumor spread (along the greater superficial petrosal nerve).   Mild chronic small vessel ischemic changes within the cerebral white matter and pons.   Large right mastoid effusion.   Large right maxillary sinus mucous retention cyst.   Previous EEG:  Overnight EEG 11/19/2019  ABNORMALITY -Seizure without clinical signs, right temporoparietal region -Brief ictal-interictal rhythmic discharges, right temporoparietal region (BIRDs) - Lateralized periodic discharges with overriding rhythmic activity ( LPD +) right, maximal temporo-parietal region - Continuous slow, generalized and maximal right temporo-parietal region.   IMPRESSION: This study showed seizures without clinical signs arising from right temporoparietal region, average 2- 3/hour, lasting about 15 to 45 seconds each.  After 1949 on 11/18/2020, EEG showed brief ictal-interictal rhythmic discharges arising from right temporoparietal region in addition to lateralized periodic discharges with overriding rhythmic activity  which are on the ictal-interictal continuum with high potential for seizures.  There is also evidence of cortical dysfunction arising from right temporo-parietal region likely secondary to underlying structural abnormality/ mass.  Additionally, there is moderate diffuse encephalopathy, nonspecific etiology.   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 Standing up the whole time, has been working there for 5 years. Currently if out of work due to disability.    REVIEW OF SYSTEMS: Full 14 system review of systems performed and negative with exception of: as noted in the HPI  ALLERGIES: Allergies  Allergen Reactions   Morphine Itching   Penicillins Rash    Has patient had a PCN reaction causing immediate rash, facial/tongue/throat swelling, SOB or lightheadedness with hypotension: No Has patient had a PCN reaction causing severe rash involving mucus membranes or skin necrosis: No Has patient had a PCN reaction that required hospitalization No Has patient had a PCN reaction occurring within the last 10 years: No If all of the above answers are "NO", then may proceed with Cephalosporin use.     HOME MEDICATIONS: Outpatient Medications Prior to Visit  Medication Sig Dispense Refill   acetaminophen (TYLENOL) 325 MG tablet Take 650 mg by mouth every 6 (six) hours as needed for mild pain or headache.     carboxymethylcellulose (  REFRESH PLUS) 0.5 % SOLN Place 1 drop into both eyes daily as needed (for dry eyes).     dexamethasone (DECADRON) 4 MG tablet Take 1 tablet (4 mg total) by mouth every 6 (six) hours. 120 tablet 0   docusate sodium (COLACE) 100 MG capsule Take 100 mg by mouth daily as needed for mild constipation (uses once in a while).     esomeprazole (NEXIUM) 40 MG capsule Take 1 capsule (40 mg total) by mouth daily. 90 capsule 3   Lacosamide 100 MG TABS Take 1 tablet (100 mg total) by mouth in the morning and at bedtime. 180 tablet 4   PARoxetine  (PAXIL) 40 MG tablet Take 1 tablet (40 mg total) by mouth daily. 90 tablet 1   levETIRAcetam (KEPPRA) 750 MG tablet Take 1 tablet (750 mg total) by mouth 2 (two) times daily. 120 tablet 11   No facility-administered medications prior to visit.    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   Depression    GERD (gastroesophageal reflux disease)    Panic attack    Seizures ([place])     PAST SURGICAL HISTORY: Past Surgical History:  Procedure Laterality Date   ABDOMINAL HYSTERECTOMY     CHOLECYSTECTOMY N/A 11/21/2017   Procedure: LAPAROSCOPIC CHOLECYSTECTOMY WITH INTRAOPERATIVE CHOLANGIOGRAM;  Surgeon: Jovita Kussmaul, MD;  Location: [facility];  Service: General;  Laterality: N/A;   MULTIPLE EXTRACTIONS WITH ALVEOLOPLASTY N/A 08/28/2019   Procedure: Extraction of tooth #'s 3-7, 10-15, and 20-31 with alveoloplatsy, maxillary right buccal exostosis reduction, and bilateral mandibular tori reductions.;  Surgeon: Lenn Cal, DDS;  Location: [facility];  Service: Oral Surgery;  Laterality: N/A;   PAROTIDECTOMY Right 07/22/2019   with biopsy; done at [facility] by Dr. Fredricka Bonine    FAMILY HISTORY: Family History  Problem Relation Age of Onset   Depression Sister    Heart disease Mother    Diabetes Father    Breast cancer Paternal Grandmother    Colon cancer Neg Hx    Pancreatic cancer Neg Hx    Esophageal cancer Neg Hx    Stomach cancer Neg Hx    Rectal cancer Neg Hx    Liver cancer Neg Hx     SOCIAL HISTORY: Social History   Socioeconomic History   Marital status: Married    Spouse name: Richard   Number of children: 4   Years of education: Not on file   Highest education level: Not on file  Occupational History   Not on file  Tobacco Use   Smoking status: Every Day    Packs/day: 0.50    Years: 42.00    Pack years: 21.00    Types: Cigarettes    Start date: 1978   Smokeless tobacco: Never  Vaping Use   Vaping Use: Never used   Substance and Sexual Activity   Alcohol use: No    Alcohol/week: 0.0 standard drinks   Drug use: No   Sexual activity: Yes    Birth control/protection: None  Other Topics Concern   Not on file  Social History Narrative   Lives with husband   Right Handed   Drinks 6-7 cups caffeine daily   Social Determinants of Health   Financial Resource Strain: Not on file  Food Insecurity: Not on file  Transportation Needs: Not on file  Physical Activity: Not on file  Stress: Not on file  Social Connections: Not on file  Intimate  Partner Violence: Not on file     PHYSICAL EXAM  GENERAL EXAM/CONSTITUTIONAL: Vitals:  Vitals:   02/23/21 0837  BP: (!) 158/79  Pulse: 80  Weight: 156 lb 8 oz (71 kg)  Height: 5\' 4"  (1.626 m)   Body mass index is 26.86 kg/m. Wt Readings from Last 3 Encounters:  02/23/21 156 lb 8 oz (71 kg)  02/20/21 136 lb (61.7 kg)  01/10/21 136 lb 3.2 oz (61.8 kg)   Patient is in no distress; well developed, nourished and groomed; neck is supple  EYES: Pupils round and reactive to light, Visual fields full to confrontation, Extraocular movements intacts,   MUSCULOSKELETAL: Gait, strength, tone, movements noted in Neurologic exam below  NEUROLOGIC: MENTAL STATUS:  awake, alert, oriented to person, place and time, with one word answer normal attention and concentration language fluent, comprehension intact, naming intact fund of knowledge appropriate  CRANIAL NERVE:  2nd, 3rd, 4th, 6th - pupils equal and reactive to light, visual fields full to confrontation, extraocular muscles intact, no nystagmus 5th - facial sensation symmetric 7th - There is right facial nerve weakness  8th - hearing intact 9th - palate elevates symmetrically, uvula midline 11th - shoulder shrug symmetric 12th - tongue protrusion midline  MOTOR:  normal bulk and tone, full strength in the BUE, BLE  SENSORY:  normal and symmetric to light touch, pinprick, temperature,  vibration  COORDINATION:  finger-nose-finger, fine finger movements normal  REFLEXES:  deep tendon reflexes present and symmetric  GAIT/STATION:  normal   DIAGNOSTIC DATA (LABS, IMAGING, TESTING) - I reviewed patient records, labs, notes, testing and imaging myself where available.  Lab Results  Component Value Date   WBC 7.7 02/20/2021   HGB 13.7 02/20/2021   HCT 40.8 02/20/2021   MCV 92.5 02/20/2021   PLT 180 02/20/2021      Component Value Date/Time   NA 136 02/20/2021 1424   NA 140 07/23/2017 0820   K 4.1 02/20/2021 1424   CL 98 02/20/2021 1424   CO2 28 02/20/2021 1424   GLUCOSE 95 02/20/2021 1424   BUN 11 02/20/2021 1424   BUN 11 07/23/2017 0820   CREATININE 0.52 02/20/2021 1424   CALCIUM 8.9 02/20/2021 1424   PROT 5.6 (L) 02/20/2021 1424   PROT 7.0 07/23/2017 0820   ALBUMIN 3.2 (L) 02/20/2021 1424   ALBUMIN 4.4 07/23/2017 0820   AST 25 02/20/2021 1424   ALT 51 (H) 02/20/2021 1424   ALKPHOS 35 (L) 02/20/2021 1424   BILITOT 0.4 02/20/2021 1424   BILITOT 0.2 07/23/2017 0820   GFRNONAA >60 02/20/2021 1424   GFRAA >60 08/26/2019 0830   No results found for: CHOL, HDL, LDLCALC, LDLDIRECT, TRIG, CHOLHDL Lab Results  Component Value Date   HGBA1C 5.8 (H) 11/17/2020   No results found for: [id number] Lab Results  Component Value Date   TSH 0.969 12/30/2020    Head CT 02/20/2021: 1. No acute findings. 2. Slight interval improvement of changes over the right temporal lobe compatible with known mass with edema in this region.    MRI Brain 12/30/2020 1. Interval increase in size of enhancing lesion at the anterior right temporal lobe, now measuring 0.8 x 1.5 x 2.5 cm. Associated vasogenic edema within the adjacent right temporal region has also slightly worsened. 2. No other acute intracranial abnormality. 3. Right mastoid effusion.     I personally reviewed Brain images and EEG.    ASSESSMENT AND PLAN  60 y.o. 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  Seizures still not controlled, she was on levetiracetam, phenytoin and dexamethasone but presented to the ED for additional seizures, repeat MRI showed worsening of the vasogenic edema.  At that time I discontinued the phenytoin because it is an enzyme inducer and had interaction with the dexamethasone.  I started her on lacosamide 100 mg twice daily with her Keppra 750 twice daily.  She had another seizure on October 2.  I will increase her Keppra to 1000 mg twice daily and continue her on lacosamide 100 mg twice daily.  Her last Keppra level was 10.4 and lacosamide was not checked.  I will obtain a lacosamide level today and see her in 1 month for follow-up.  I will also put the patient off work for at least 6 months until or until her condition improves.  A letter to her employer was given to husband.    1. Seizure ([place])   2. Facial paralysis   3. Cancer of parotid gland ([place])   4. Right temporal lobe mass   5. Vasogenic cerebral edema ([facility])     PLAN: Increase Keppra to 1000 mg BID  Continue with Lacosamide 100 mg BID, we will obtain a level today  Return for follow on 03/28/2021   Per [facility] statutes, patients with seizures are not allowed to drive until they have been seizure-free for six months.  Other recommendations include using caution when using heavy equipment or power tools. Avoid working on ladders or at heights. Take showers instead of baths.  Do not swim alone.  Ensure the water temperature is not too high on the home water heater. Do not go swimming alone. Do not lock yourself in a room alone (i.e. bathroom). When caring for infants or small  children, sit down when holding, feeding, or changing them to minimize risk of injury to the child in the event you have a seizure. Maintain good sleep hygiene. Avoid alcohol.  Also recommend adequate sleep, hydration, good diet and minimize stress.   Orders Placed This Encounter  Procedures   Lacosamide      Meds ordered this encounter  Medications   levETIRAcetam (KEPPRA) 1000 MG tablet    Sig: Take 1 tablet (1,000 mg total) by mouth 2 (two) times daily.    Dispense:  60 tablet    Refill:  3      Return in about 1 month (around 03/28/2021).    Alric Ran, MD 02/23/2021, 9:29 AM  [facility] [address] [place], [place] [address] [phone]

## 2021-02-23 NOTE — Patient Instructions (Signed)
Increase Keppra to 1000 mg BID  Continue with Lacosamide 100 mg BID, we will obtain a level today  Return for follow on 03/28/2021

## 2021-02-26 LAB — LACOSAMIDE: Lacosamide: 6.7 ug/mL (ref 5.0–10.0)

## 2021-03-01 ENCOUNTER — Telehealth: Payer: Self-pay

## 2021-03-01 DIAGNOSIS — Z0289 Encounter for other administrative examinations: Secondary | ICD-10-CM

## 2021-03-01 NOTE — Telephone Encounter (Signed)
Received and completed FMLA forms for patient and her husband.  I spoke with Dr. April Manson. Patient should be out of work for 6 months or until her seizures are controlled and patient's husband should be out of work to supervise her for safety.  Forms given to Dr. April Manson for review and signature.  Dr. April Manson reviewed and signed both sets of FMLA forms. Sent to MR for processing.

## 2021-03-04 DIAGNOSIS — R7303 Prediabetes: Secondary | ICD-10-CM | POA: Insufficient documentation

## 2021-03-04 NOTE — Progress Notes (Signed)
Patient ID: Melissa Case, female    DOB: Sep 12, 1960  MRN: [id number]  CC: Annual Physical Exam  Subjective: Melissa Case is a 60 y.o. female who presents for annual physical exam. She is accompanied by her husband, Richard.   Her concerns today include:  Reports history of total hysterectomy and declines PAP test on today. Concern for developing bed sores on bottom. Husband reports patient has been in the hospital recently where she remained mostly on her back and bottom. Reports at home she stays in bed mostly because of the seizure medications given per Neurology makes her sleepy. Recent appointment with Neurology 03/08/2021 and next 03/28/2021. Husband attempts to keep her turned but unsuccessful because patient always ends up laying on her bottom and back. Patient denies pain or itching. Husband reports he cannot recall when the areas of concern of the bottom began. Reports patient independently toilets herself and no issues with incontinence.    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   Lesion of parotid gland 07/14/2019   Acute calculous cholecystitis 11/21/2017   Gastro-esophageal reflux disease without esophagitis 02/03/2014   Abdominal pain 09/30/2013   SBO (small bowel obstruction) ([place]) 09/30/2013   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/05/2012   Gall stone 04/05/2012   Barrett's esophagus 04/05/2012   Cholelithiasis 03/27/2012   Hyperlipidemia 03/25/2012   Depression 09/15/2011   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      carboxymethylcellulose (REFRESH PLUS) 0.5 % SOLN Place 1 drop into both eyes daily as needed (for dry eyes).     dexamethasone (DECADRON) 4 MG tablet Take 1 tablet (4 mg total) by mouth every 6 (six) hours. 120 tablet 0   docusate sodium (COLACE) 100 MG capsule Take 100 mg by mouth daily as needed for mild constipation (uses once in a while).     esomeprazole (NEXIUM) 40 MG capsule Take 1 capsule (40 mg total) by mouth daily. 90 capsule 3   Lacosamide 100 MG TABS Take 1 tablet (100 mg total) by mouth in the morning and at bedtime. 180 tablet 4   levETIRAcetam (KEPPRA) 1000 MG tablet Take 1 tablet (1,000 mg total) by mouth 2 (two) times daily. 60 tablet 3   PARoxetine (PAXIL) 40 MG tablet Take 1 tablet (40 mg total) by mouth daily. 90 tablet 1   No current facility-administered medications on file prior to visit.    Allergies  Allergen Reactions   Morphine Itching   Penicillins Rash    Has patient had a PCN reaction causing immediate rash, facial/tongue/throat swelling, SOB or lightheadedness with hypotension: No Has patient had a PCN reaction causing severe rash involving mucus membranes or skin necrosis: No Has patient had a PCN reaction that required hospitalization No Has patient had a PCN reaction occurring within the last 10 years: No If all of the above answers are "NO", then may proceed with Cephalosporin use.     Social History   Socioeconomic History   Marital status: Married    Spouse name: [name]  Number of children: 4   Years of education: Not on file   Highest education level: Not on file  Occupational History   Not on file  Tobacco Use   Smoking status: Every Day    Packs/day: 0.50    Years: 42.00    Pack years: 21.00    Types: Cigarettes    Start date: 61   Smokeless tobacco: Never  Vaping Use   Vaping Use: Never used  Substance and Sexual Activity   Alcohol use: No    Alcohol/week: 0.0 standard drinks   Drug use: No   Sexual activity: Yes    Birth  control/protection: None  Other Topics Concern   Not on file  Social History Narrative   Lives with husband   Right Handed   Drinks 6-7 cups caffeine daily   Social Determinants of Health   Financial Resource Strain: Not on file  Food Insecurity: Not on file  Transportation Needs: Not on file  Physical Activity: Not on file  Stress: Not on file  Social Connections: Not on file  Intimate Partner Violence: Not on file    Family History  Problem Relation Age of Onset   Depression Sister    Heart disease Mother    Diabetes Father    Breast cancer Paternal Grandmother    Colon cancer Neg Hx    Pancreatic cancer Neg Hx    Esophageal cancer Neg Hx    Stomach cancer Neg Hx    Rectal cancer Neg Hx    Liver cancer Neg Hx     Past Surgical History:  Procedure Laterality Date   ABDOMINAL HYSTERECTOMY     CHOLECYSTECTOMY N/A 11/21/2017   Procedure: LAPAROSCOPIC CHOLECYSTECTOMY WITH INTRAOPERATIVE CHOLANGIOGRAM;  Surgeon: Jovita Kussmaul, MD;  Location: [facility];  Service: General;  Laterality: N/A;   MULTIPLE EXTRACTIONS WITH ALVEOLOPLASTY N/A 08/28/2019   Procedure: Extraction of tooth #'s 3-7, 10-15, and 20-31 with alveoloplatsy, maxillary right buccal exostosis reduction, and bilateral mandibular tori reductions.;  Surgeon: Lenn Cal, DDS;  Location: [facility];  Service: Oral Surgery;  Laterality: N/A;   PAROTIDECTOMY Right 07/22/2019   with biopsy; done at [facility] by Dr. Fredricka Bonine    ROS: Review of Systems Negative except as stated above  PHYSICAL EXAM: BP (!) 145/87   Pulse 77   Temp 97.8 F (36.6 C) (Oral)   Ht 5\' 4"  (1.626 m)   Wt 163 lb 9.6 oz (74.2 kg)   SpO2 94%   BMI 28.08 kg/m   Physical Exam Constitutional:      Appearance: Normal appearance.     Comments: Husband reports patient is sleepy today because of seizure medications she is taking. Patient is able to answer all questions and interact with provider during today's appointment.  HENT:      Head: Normocephalic and atraumatic.     Right Ear: Tympanic membrane, ear canal and external ear normal.     Left Ear: Tympanic membrane, ear canal and external ear normal.  Eyes:     Extraocular Movements: Extraocular movements intact.     Conjunctiva/sclera: Conjunctivae normal.     Pupils: Pupils are equal, round, and reactive to light.  Cardiovascular:     Rate and Rhythm: Normal rate and regular rhythm.     Pulses: Normal pulses.     Heart sounds: Normal heart sounds.  Pulmonary:     Effort: Pulmonary effort is normal.     Breath sounds: Normal breath sounds.  Chest:  Comments: Patient declined exam. Abdominal:     General: Bowel sounds are normal.     Palpations: Abdomen is soft.  Genitourinary:    Comments: Patient declined exam. Musculoskeletal:        General: Normal range of motion.     Cervical back: Normal range of motion and neck supple.  Skin:    General: Skin is warm and dry.     Capillary Refill: Capillary refill takes less than 2 seconds.     Comments: Erythematous macular rash bilateral buttocks. No evidence of drainage or skin tears.   Neurological:     General: No focal deficit present.     Mental Status: She is oriented to person, place, and time and easily aroused.  Psychiatric:        Mood and Affect: Mood normal.        Behavior: Behavior normal.    ASSESSMENT AND PLAN: 1. Annual physical exam: - Counseled on 150 minutes of exercise per week as tolerated, healthy eating (including decreased daily intake of saturated fats, cholesterol, added sugars, sodium), STI prevention, and routine healthcare maintenance.  2. Screening for metabolic disorder: - CMP last obtained 02/20/2021.  3. Screening for deficiency anemia: - CBC last obtained 02/20/2021.  4. Screening cholesterol level: - Lipid panel to screen for high cholesterol.  - Lipid Panel  5. Thyroid disorder screen: - TSH last obtained 12/30/2020.  6. Need for hepatitis C screening test: -  Hepatitis C antibody to screen for hepatitis C.  - Hepatitis C Antibody  7. Prediabetes: - Hemoglobin A1C, 5.8% last obtained 11/18/2020, next due December 2022.  8. Encounter for screening mammogram for malignant neoplasm of breast: - Referral for breast cancer screening by mammogram.  - MM Digital Screening; Future  9. Pap smear for cervical cancer screening: 10. Routine screening for STI (sexually transmitted infection): - Patient declined.   11. Rash and nonspecific skin eruption: - Husband concerned for possible bed sores.  - Mupirocin cream as prescribed.  - Referral to [facility] for further evaluation and management. - Follow-up with primary provider as scheduled.  - AMB referral to wound care center - mupirocin cream (BACTROBAN) 2 %; Apply 1 application topically 2 (two) times daily.  Dispense: 30 g; Refill: 1  12. Need for immunization against influenza: - Administered today in office.  - Flu Vaccine QUAD 75mo+IM (Fluarix, Fluzone & Alfiuria Quad PF)   Patient was given the opportunity to ask questions.  Patient verbalized understanding of the plan and was able to repeat key elements of the plan. Patient was given clear instructions to go to Emergency Department or return to medical center if symptoms don't improve, worsen, or new problems develop.The patient verbalized understanding.   Orders Placed This Encounter  Procedures   MM Digital Screening   Flu Vaccine QUAD 52mo+IM (Fluarix, Fluzone & Alfiuria Quad PF)   Hepatitis C Antibody   Lipid Panel   AMB referral to wound care center   Requested Prescriptions   Signed Prescriptions Disp Refills   mupirocin cream (BACTROBAN) 2 % 30 g 1    Sig: Apply 1 application topically 2 (two) times daily.    Return in about 1 year (around 03/11/2022) for Physical per patient preference.  Camillia Herter, NP

## 2021-03-07 ENCOUNTER — Telehealth: Payer: Self-pay | Admitting: Neurology

## 2021-03-07 NOTE — Telephone Encounter (Signed)
Pt sleeping all day and now getting bed sores, husband wants to know what can be done to help.

## 2021-03-07 NOTE — Telephone Encounter (Signed)
I spoke to the patient's husband. States since her last medication increase, she has been overwhelming tired and feels hot most of the time.  She has been sleeping sometimes up to 12 hours daily and has developed several open bed sores on her buttocks.  I educated her husband that she needs to do the following:  1) She should change positions and keep moving as much as possible. While sleeping, he should reposition her at least every 2 hours. 2) Stand up to relieve pressure if you can. 3) Use special pressure relieving mattresses and cushions. 4) Keep open areas clean and dry. She has been applying neosporin to areas. 5) Stay well hydrated.  If the areas start to worsen rather than improve, she needs to see her PCP for wound care. He verbalized understanding the seriousness of letting them get out of control (risk of infection).  We will have Dr. April Manson review her medications and call him back.

## 2021-03-08 ENCOUNTER — Telehealth: Payer: Self-pay | Admitting: *Deleted

## 2021-03-08 ENCOUNTER — Ambulatory Visit (INDEPENDENT_AMBULATORY_CARE_PROVIDER_SITE_OTHER): Payer: No Typology Code available for payment source | Admitting: Neurology

## 2021-03-08 ENCOUNTER — Encounter: Payer: Self-pay | Admitting: Neurology

## 2021-03-08 VITALS — BP 155/83 | HR 82 | Ht 64.0 in | Wt 156.0 lb

## 2021-03-08 DIAGNOSIS — R569 Unspecified convulsions: Secondary | ICD-10-CM | POA: Diagnosis not present

## 2021-03-08 DIAGNOSIS — G936 Cerebral edema: Secondary | ICD-10-CM

## 2021-03-08 DIAGNOSIS — Z5181 Encounter for therapeutic drug level monitoring: Secondary | ICD-10-CM

## 2021-03-08 DIAGNOSIS — G9389 Other specified disorders of brain: Secondary | ICD-10-CM

## 2021-03-08 NOTE — Progress Notes (Signed)
[facility]  PATIENT: Melissa Case DOB: 09-26-1960  REFERRING CLINICIAN: Camillia Herter, NP HISTORY FROM: Patient and Husband  REASON FOR VISIT: Seizure    HISTORICAL  CHIEF COMPLAINT:  Chief Complaint  Patient presents with   Follow-up    Rm 12, with husband, reports sleeping all day and confusion      INTERVAL HISTORY 03/08/2021: Patient present today for follow-up with husband.  Last visit was October 5 at that time we increase the Keppra to 1000 mg twice daily due to recent seizure.  Since being home has been noted that patient has been sleeping all the time, she also complains of feeling hot all the time.  There are periods of confusion, she does not know where she is, husband described no energy and she has been laying down all day to the point that she developed bedsores.  He is treating them with Neosporin.  Denies any additional seizures since last visit.  They contacted me yesterday with his new complaint therefore I brought him in today for further checkup and to obtain blood work.     INTERVAL HISTORY 02/23/2021:  Patient presents today for follow-up with her husband.  Since last visit she has presented to the ED for additional seizures, MRI done at that time showed worsening of the edema despite dexamethasone.  She was also at that time on Keppra and phenytoin.  I discontinued the phenytoin (enzyme inducer) and started her on lacosamide 100 mg twice daily.  Since being on this current regimen husband has noted possible minor seizures due to the fact that she is confused time.  He reported the last major seizure was on October 2 when she was at work.  She was missing of work and was found an hour or hour and a half later wandering in the parking lot.  She presented to the ED where she had a head CT showing slight improvement of the edema.  A Keppra level was obtained and it was 10.4.  Lacosamide level was not obtained.  She reported compliance with her  medication.  She has been working but on limited duty but due to recent seizure and wandering outside the parking lot, husband was asking if she can be out of work until her condition improves.  He denies any side effect from the medication.      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  Patient stated she had a first lifetime seizure on June 7 of this year.  Per husband, on the day she was not acting like her normal self.  She was having trouble with her right hand.  Husband decided to call 911 and soon after he helped her to the floor because she seems like she was falling. While on the ground she had a generalized shaking episode lasting about a minute and followed by sleepiness and tiredness.  She was taken to [facility] where she was admitted for further workup.  Patient states that she does not remember the seizures and does not remember how she got in the hospital. She was having memory issues in the hospital. In the hospital she was on hooked up on EEG which showed subclinical seizures coming from the right temporoparietal region.  She was started on levetiracetam and phenytoin.    Patient also had a MRI brain which  showed a 2.4 x 1.4 x 0.8 cm peripherally enhancing focus within the inferior right temporal lobe which was new compared to the previous study.  There was also moderate vasogenic edema within the right temporal lobe She was also seen by neurosurgery who recommended a repeat MRI in 2 to 3 months and she was also started on Decadron for vasogenic edema.  Patient reported since leaving the hospital she has not had any additional seizures.  She is on Keppra 1000 mg twice a day and phenytoin 100 mg 3 times a day.  She reports side effects including increase daytime sleepiness, some nausea and also increase anxiety.   She feels like she has to do adjust to the medication and the new diagnosis.  Denies any mood swing denies any dizziness, no falls   Seizure Type:  Possible focal seizure  Current frequency: only one time on June 7   Seizure risk factors: parotid gland tumors s/p radiation, no family seizure history.   Previous ASMs: Levetiracetam, Phenytoin  Currenty ASMs: Levetiracetam, Lacosamide  Side effects: Sleepiness, nausea, anxiety   Brain images:  MRI Brain:  2.4 x 1.4 x 0.8 cm peripherally enhancing focus within the inferior right temporal lobe, new as compared to the brain MRI of 06/25/2019. Also new from this prior exam, there is moderate vasogenic edema within the right temporal lobe, extending to the right temporal stem. These findings may reflect necrosis and edema related to prior radiation therapy. However, a right temporal lobe metastasis cannot be excluded. Neuro-Oncology consultation is recommended.   Diffusion-weighted signal abnormality within the cortex of the anterior right temporal lobe, within the right hippocampus and within the medial right thalamus. Findings are nonspecific. Primary considerations are encephalitis (including herpes encephalitis), seizure-related changes or acute ischemia.   Sequela of interval right parotid resection. Persistent asymmetric enhancement of the mastoid segment of the right facial nerve. Residual perineural tumor at this site cannot be excluded.   New from the prior brain MRI, there is subtle asymmetric enhancement along the inferomedial aspect of the right middle cranial fossa, which could reflect additional perineural tumor spread (along the greater superficial petrosal nerve).   Mild chronic small vessel ischemic changes within the cerebral white matter and pons.   Large right mastoid effusion.   Large right maxillary sinus mucous retention cyst.   Previous EEG:  Overnight EEG 11/19/2019  ABNORMALITY -Seizure without clinical signs,  right temporoparietal region -Brief ictal-interictal rhythmic discharges, right temporoparietal region (BIRDs) - Lateralized periodic discharges with overriding rhythmic activity ( LPD +) right, maximal temporo-parietal region - Continuous slow, generalized and maximal right temporo-parietal region.   IMPRESSION: This study showed seizures without clinical signs arising from right temporoparietal region, average 2- 3/hour, lasting about 15 to 45 seconds each.  After 1949 on 11/18/2020, EEG showed brief ictal-interictal rhythmic discharges arising from right temporoparietal region in addition to lateralized periodic discharges with overriding rhythmic activity which are on the ictal-interictal continuum with high potential for seizures.  There is also evidence of cortical dysfunction arising from right temporo-parietal region likely secondary to underlying structural abnormality/ mass.  Additionally, there is moderate diffuse encephalopathy, nonspecific etiology.   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 Standing up the whole time, has been working there for 5 years. Currently if out of work due to disability.    REVIEW OF SYSTEMS: Full 14 system review of systems performed and negative with exception of: as noted in the HPI  ALLERGIES: Allergies  Allergen Reactions   Morphine Itching   Penicillins Rash    Has patient had a PCN reaction causing immediate rash, facial/tongue/throat swelling, SOB or lightheadedness with hypotension: No Has patient had a PCN reaction causing severe rash involving mucus membranes or skin necrosis: No Has patient had a PCN reaction that required hospitalization No Has patient had a PCN reaction occurring within the last 10 years: No If all of the above answers are "NO", then may proceed with Cephalosporin use.     HOME MEDICATIONS: Outpatient Medications Prior to Visit  Medication Sig Dispense Refill   acetaminophen  (TYLENOL) 325 MG tablet Take 650 mg by mouth every 6 (six) hours as needed for mild pain or headache.     carboxymethylcellulose (REFRESH PLUS) 0.5 % SOLN Place 1 drop into both eyes daily as needed (for dry eyes).     dexamethasone (DECADRON) 4 MG tablet Take 1 tablet (4 mg total) by mouth every 6 (six) hours. 120 tablet 0   docusate sodium (COLACE) 100 MG capsule Take 100 mg by mouth daily as needed for mild constipation (uses once in a while).     esomeprazole (NEXIUM) 40 MG capsule Take 1 capsule (40 mg total) by mouth daily. 90 capsule 3   Lacosamide 100 MG TABS Take 1 tablet (100 mg total) by mouth in the morning and at bedtime. 180 tablet 4   levETIRAcetam (KEPPRA) 1000 MG tablet Take 1 tablet (1,000 mg total) by mouth 2 (two) times daily. 60 tablet 3   PARoxetine (PAXIL) 40 MG tablet Take 1 tablet (40 mg total) by mouth daily. 90 tablet 1   No facility-administered medications prior to visit.    PAST MEDICAL HISTORY: Past Medical History:  Diagnosis Date   Cancer ([place])    Partoid   Depression    GERD (gastroesophageal reflux disease)    Panic attack    Seizures ([place])     PAST SURGICAL HISTORY: Past Surgical History:  Procedure Laterality Date   ABDOMINAL HYSTERECTOMY     CHOLECYSTECTOMY N/A 11/21/2017   Procedure: LAPAROSCOPIC CHOLECYSTECTOMY WITH INTRAOPERATIVE CHOLANGIOGRAM;  Surgeon: Jovita Kussmaul, MD;  Location: [facility];  Service: General;  Laterality: N/A;   MULTIPLE EXTRACTIONS WITH ALVEOLOPLASTY N/A 08/28/2019   Procedure: Extraction of tooth #'s 3-7, 10-15, and 20-31 with alveoloplatsy, maxillary right buccal exostosis reduction, and bilateral mandibular tori reductions.;  Surgeon: Lenn Cal, DDS;  Location: [place];  Service: Oral Surgery;  Laterality: N/A;   PAROTIDECTOMY Right 07/22/2019   with biopsy; done at [facility] by Dr. Fredricka Bonine    FAMILY HISTORY: Family History  Problem Relation Age of Onset   Depression Sister    Heart disease Mother     Diabetes Father    Breast cancer Paternal Grandmother    Colon cancer Neg Hx    Pancreatic cancer Neg Hx    Esophageal cancer Neg Hx    Stomach cancer Neg Hx    Rectal cancer Neg Hx    Liver cancer Neg Hx     SOCIAL HISTORY: Social History   Socioeconomic History   Marital status: Married    Spouse name: Richard   Number of children: 4   Years of education: Not on file   Highest education level: Not on file  Occupational History   Not on file  Tobacco Use   Smoking status: Every Day    Packs/day: 0.50    Years: 42.00    Pack years: 21.00    Types: Cigarettes  Start date: 54   Smokeless tobacco: Never  Vaping Use   Vaping Use: Never used  Substance and Sexual Activity   Alcohol use: No    Alcohol/week: 0.0 standard drinks   Drug use: No   Sexual activity: Yes    Birth control/protection: None  Other Topics Concern   Not on file  Social History Narrative   Lives with husband   Right Handed   Drinks 6-7 cups caffeine daily   Social Determinants of Health   Financial Resource Strain: Not on file  Food Insecurity: Not on file  Transportation Needs: Not on file  Physical Activity: Not on file  Stress: Not on file  Social Connections: Not on file  Intimate Partner Violence: Not on file     PHYSICAL EXAM  GENERAL EXAM/CONSTITUTIONAL: Vitals:  Vitals:   03/08/21 0952  BP: (!) 155/83  Pulse: 82  Weight: 156 lb (70.8 kg)  Height: 5\' 4"  (1.626 m)    Body mass index is 26.78 kg/m. Wt Readings from Last 3 Encounters:  03/08/21 156 lb (70.8 kg)  02/23/21 156 lb 8 oz (71 kg)  02/20/21 136 lb (61.7 kg)   Patient is in no distress; well developed, nourished and groomed; neck is supple  EYES: Pupils round and reactive to light, Visual fields full to confrontation, Extraocular movements intacts,   MUSCULOSKELETAL: Gait, strength, tone, movements noted in Neurologic exam below  NEUROLOGIC: MENTAL STATUS:  awake, alert, oriented to person, place and  year but not date or month, one word answer Able to state the day of the week backward, able to calculate the number of quarters in $1.75 language fluent, comprehension intact, naming intact fund of knowledge appropriate  CRANIAL NERVE:  2nd, 3rd, 4th, 6th - pupils equal and reactive to light, visual fields full to confrontation, extraocular muscles intact, no nystagmus 5th - facial sensation symmetric 7th - There is right facial nerve weakness  8th - hearing intact 9th - palate elevates symmetrically, uvula midline 11th - shoulder shrug symmetric 12th - tongue protrusion midline  MOTOR:  normal bulk and tone, full strength in the BUE, BLE  SENSORY:  normal and symmetric to light touch, pinprick, temperature, vibration  COORDINATION:  finger-nose-finger, fine finger movements normal  REFLEXES:  deep tendon reflexes present and symmetric  GAIT/STATION:  normal   DIAGNOSTIC DATA (LABS, IMAGING, TESTING) - I reviewed patient records, labs, notes, testing and imaging myself where available.  Lab Results  Component Value Date   WBC 7.7 02/20/2021   HGB 13.7 02/20/2021   HCT 40.8 02/20/2021   MCV 92.5 02/20/2021   PLT 180 02/20/2021      Component Value Date/Time   NA 136 02/20/2021 1424   NA 140 07/23/2017 0820   K 4.1 02/20/2021 1424   CL 98 02/20/2021 1424   CO2 28 02/20/2021 1424   GLUCOSE 95 02/20/2021 1424   BUN 11 02/20/2021 1424   BUN 11 07/23/2017 0820   CREATININE 0.52 02/20/2021 1424   CALCIUM 8.9 02/20/2021 1424   PROT 5.6 (L) 02/20/2021 1424   PROT 7.0 07/23/2017 0820   ALBUMIN 3.2 (L) 02/20/2021 1424   ALBUMIN 4.4 07/23/2017 0820   AST 25 02/20/2021 1424   ALT 51 (H) 02/20/2021 1424   ALKPHOS 35 (L) 02/20/2021 1424   BILITOT 0.4 02/20/2021 1424   BILITOT 0.2 07/23/2017 0820   GFRNONAA >60 02/20/2021 1424   GFRAA >60 08/26/2019 0830   No results found for: CHOL, HDL, LDLCALC, LDLDIRECT, TRIG, CHOLHDL  Lab Results  Component Value Date   HGBA1C  5.8 (H) 11/17/2020   No results found for: [id number] Lab Results  Component Value Date   TSH 0.969 12/30/2020   Keppra level  10/2: 10.4   Lacosamide level  10/5: 6.7  Head CT 02/20/2021: 1. No acute findings. 2. Slight interval improvement of changes over the right temporal lobe compatible with known mass with edema in this region.    MRI Brain 12/30/2020 1. Interval increase in size of enhancing lesion at the anterior right temporal lobe, now measuring 0.8 x 1.5 x 2.5 cm. Associated vasogenic edema within the adjacent right temporal region has also slightly worsened. 2. No other acute intracranial abnormality. 3. Right mastoid effusion.    I personally reviewed Brain images and EEG.    ASSESSMENT AND PLAN  60 y.o. 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  Currently she is on Keppra 1000 mg twice daily and lacosamide 100 mg twice daily.  Report worsening daytime sleepiness, increased confusion, no energy, only wakes up to eat and return to bed to the point that she developed bedsore.  No seizures since last visit.  I will check both Keppra and lacosamide levels and adjust if needed.  In the case that both levels are within normal limits I will discontinue the lacosamide and start patient on valproic acid with levetiracetam.  I will contact the patient to go over the results of the ASMs level otherwise I will see her in follow-up on November 7   1. Seizure ([place])   2. Vasogenic cerebral edema ([place])   3. Right temporal lobe mass      PLAN: Will check Keppra and Vimpat level today  Follow up as scheduled on 03/28/2021   Per [facility] statutes,  patients with seizures are not allowed to drive until they have been seizure-free for six months.  Other recommendations include using caution when using heavy equipment or power tools. Avoid working on ladders or at heights. Take showers instead of baths.  Do not swim alone.  Ensure the water temperature is not too high on the home water heater. Do not go swimming alone. Do not lock yourself in a room alone (i.e. bathroom). When caring for infants or small children, sit down when holding, feeding, or changing them to minimize risk of injury to the child in the event you have a seizure. Maintain good sleep hygiene. Avoid alcohol.  Also recommend adequate sleep, hydration, good diet and minimize stress.   Orders Placed This Encounter  Procedures   Levetiracetam level   Lacosamide       No orders of the defined types were placed in this encounter.     No follow-ups on file.    Alric Ran, MD 03/08/2021, 10:21 AM  [facility] [address] [place], [place] [address] [phone]

## 2021-03-08 NOTE — Telephone Encounter (Signed)
Pt [place] dept fmla form fax on 03/01/21

## 2021-03-08 NOTE — Patient Instructions (Addendum)
Will check Keppra and Vimpat level today  Follow up as scheduled

## 2021-03-10 ENCOUNTER — Other Ambulatory Visit: Payer: Self-pay | Admitting: Neurosurgery

## 2021-03-10 DIAGNOSIS — I6789 Other cerebrovascular disease: Secondary | ICD-10-CM

## 2021-03-11 ENCOUNTER — Ambulatory Visit (INDEPENDENT_AMBULATORY_CARE_PROVIDER_SITE_OTHER): Payer: PRIVATE HEALTH INSURANCE | Admitting: Family

## 2021-03-11 ENCOUNTER — Encounter: Payer: Self-pay | Admitting: Family

## 2021-03-11 ENCOUNTER — Other Ambulatory Visit: Payer: Self-pay

## 2021-03-11 VITALS — BP 145/87 | HR 77 | Temp 97.8°F | Ht 64.0 in | Wt 163.6 lb

## 2021-03-11 DIAGNOSIS — R7303 Prediabetes: Secondary | ICD-10-CM

## 2021-03-11 DIAGNOSIS — Z1231 Encounter for screening mammogram for malignant neoplasm of breast: Secondary | ICD-10-CM

## 2021-03-11 DIAGNOSIS — Z0001 Encounter for general adult medical examination with abnormal findings: Secondary | ICD-10-CM

## 2021-03-11 DIAGNOSIS — Z130 Encounter for screening for diseases of the blood and blood-forming organs and certain disorders involving the immune mechanism: Secondary | ICD-10-CM

## 2021-03-11 DIAGNOSIS — Z23 Encounter for immunization: Secondary | ICD-10-CM | POA: Diagnosis not present

## 2021-03-11 DIAGNOSIS — Z13228 Encounter for screening for other metabolic disorders: Secondary | ICD-10-CM

## 2021-03-11 DIAGNOSIS — Z0000 Encounter for general adult medical examination without abnormal findings: Secondary | ICD-10-CM

## 2021-03-11 DIAGNOSIS — Z113 Encounter for screening for infections with a predominantly sexual mode of transmission: Secondary | ICD-10-CM

## 2021-03-11 DIAGNOSIS — Z13220 Encounter for screening for lipoid disorders: Secondary | ICD-10-CM

## 2021-03-11 DIAGNOSIS — R21 Rash and other nonspecific skin eruption: Secondary | ICD-10-CM

## 2021-03-11 DIAGNOSIS — Z1329 Encounter for screening for other suspected endocrine disorder: Secondary | ICD-10-CM

## 2021-03-11 DIAGNOSIS — Z124 Encounter for screening for malignant neoplasm of cervix: Secondary | ICD-10-CM

## 2021-03-11 DIAGNOSIS — Z1159 Encounter for screening for other viral diseases: Secondary | ICD-10-CM

## 2021-03-11 MED ORDER — MUPIROCIN CALCIUM 2 % EX CREA: 1.0000 "application " | TOPICAL_CREAM | Freq: Two times a day (BID) | CUTANEOUS | 1 refills | Status: DC

## 2021-03-11 NOTE — Patient Instructions (Signed)
Preventive Care 40-60 Years Old, Female Preventive care refers to lifestyle choices and visits with your health care provider that can promote health and wellness. This includes: A yearly physical exam. This is also called an annual wellness visit. Regular dental and eye exams. Immunizations. Screening for certain conditions. Healthy lifestyle choices, such as: Eating a healthy diet. Getting regular exercise. Not using drugs or products that contain nicotine and tobacco. Limiting alcohol use. What can I expect for my preventive care visit? Physical exam Your health care provider will check your: Height and weight. These may be used to calculate your BMI (body mass index). BMI is a measurement that tells if you are at a healthy weight. Heart rate and blood pressure. Body temperature. Skin for abnormal spots. Counseling Your health care provider may ask you questions about your: Past medical problems. Family's medical history. Alcohol, tobacco, and drug use. Emotional well-being. Home life and relationship well-being. Sexual activity. Diet, exercise, and sleep habits. Work and work environment. Access to firearms. Method of birth control. Menstrual cycle. Pregnancy history. What immunizations do I need? Vaccines are usually given at various ages, according to a schedule. Your health care provider will recommend vaccines for you based on your age, medical history, and lifestyle or other factors, such as travel or where you work. What tests do I need? Blood tests Lipid and cholesterol levels. These may be checked every 5 years, or more often if you are over 50 years old. Hepatitis C test. Hepatitis B test. Screening Lung cancer screening. You may have this screening every year starting at age 55 if you have a 30-pack-year history of smoking and currently smoke or have quit within the past 15 years. Colorectal cancer screening. All adults should have this screening starting at  age 50 and continuing until age 75. Your health care provider may recommend screening at age 45 if you are at increased risk. You will have tests every 1-10 years, depending on your results and the type of screening test. Diabetes screening. This is done by checking your blood sugar (glucose) after you have not eaten for a while (fasting). You may have this done every 1-3 years. Mammogram. This may be done every 1-2 years. Talk with your health care provider about when you should start having regular mammograms. This may depend on whether you have a family history of breast cancer. BRCA-related cancer screening. This may be done if you have a family history of breast, ovarian, tubal, or peritoneal cancers. Pelvic exam and Pap test. This may be done every 3 years starting at age 21. Starting at age 30, this may be done every 5 years if you have a Pap test in combination with an HPV test. Other tests STD (sexually transmitted disease) testing, if you are at risk. Bone density scan. This is done to screen for osteoporosis. You may have this scan if you are at high risk for osteoporosis. Talk with your health care provider about your test results, treatment options, and if necessary, the need for more tests. Follow these instructions at home: Eating and drinking  Eat a diet that includes fresh fruits and vegetables, whole grains, lean protein, and low-fat dairy products. Take vitamin and mineral supplements as recommended by your health care provider. Do not drink alcohol if: Your health care provider tells you not to drink. You are pregnant, may be pregnant, or are planning to become pregnant. If you drink alcohol: Limit how much you have to 0-1 drink a day. Be   aware of how much alcohol is in your drink. In the [place] one drink equals one 12 oz bottle of beer (355 mL), one 5 oz glass of wine (148 mL), or one 1 oz glass of hard liquor (44 mL). Lifestyle Take daily care of your teeth and  gums. Brush your teeth every morning and night with fluoride toothpaste. Floss one time each day. Stay active. Exercise for at least 30 minutes 5 or more days each week. Do not use any products that contain nicotine or tobacco, such as cigarettes, e-cigarettes, and chewing tobacco. If you need help quitting, ask your health care provider. Do not use drugs. If you are sexually active, practice safe sex. Use a condom or other form of protection to prevent STIs (sexually transmitted infections). If you do not wish to become pregnant, use a form of birth control. If you plan to become pregnant, see your health care provider for a prepregnancy visit. If told by your health care provider, take low-dose aspirin daily starting at age 63. Find healthy ways to cope with stress, such as: Meditation, yoga, or listening to music. Journaling. Talking to a trusted person. Spending time with friends and family. Safety Always wear your seat belt while driving or riding in a vehicle. Do not drive: If you have been drinking alcohol. Do not ride with someone who has been drinking. When you are tired or distracted. While texting. Wear a helmet and other protective equipment during sports activities. If you have firearms in your house, make sure you follow all gun safety procedures. What's next? Visit your health care provider once a year for an annual wellness visit. Ask your health care provider how often you should have your eyes and teeth checked. Stay up to date on all vaccines. This information is not intended to replace advice given to you by your health care provider. Make sure you discuss any questions you have with your health care provider. Document Revised: 07/16/2020 Document Reviewed: 01/17/2018 Elsevier Patient Education  2022 [employer].

## 2021-03-11 NOTE — Progress Notes (Signed)
Has bed sores.

## 2021-03-14 ENCOUNTER — Telehealth: Payer: Self-pay | Admitting: Neurology

## 2021-03-14 NOTE — Telephone Encounter (Signed)
I spoke with Melissa Case, our [place] representative. [facility] had equipment go down but they expect these results no later thatn 10/28/20022.  I called patient's husband, Melissa Case, per DPR. I explained this to him. He was disappointed that they may have to wait this long but I advised him that I will let Dr. April Manson know that they are eager for these results.

## 2021-03-14 NOTE — Telephone Encounter (Signed)
I called patient, spoke with patient's husband, [name], per [facility]. They have not received results of the levetiracetam or lacosamide levels yet. I advised her that we don't have the results either but will check with [facility]. Pt's husband verbalized understanding.

## 2021-03-14 NOTE — Telephone Encounter (Signed)
Pt's husband called asking about the medication change his wife is supposed to have once her blood work results came back. Pts husband requesting a call back.

## 2021-03-16 ENCOUNTER — Telehealth: Payer: Self-pay | Admitting: Neurology

## 2021-03-16 LAB — LACOSAMIDE: Lacosamide: 4.6 ug/mL — ABNORMAL LOW (ref 5.0–10.0)

## 2021-03-16 LAB — LEVETIRACETAM LEVEL: Levetiracetam Lvl: 6.8 ug/mL — ABNORMAL LOW (ref 10.0–40.0)

## 2021-03-16 MED ORDER — DIVALPROEX SODIUM 500 MG PO DR TAB: 500.0000 mg | DELAYED_RELEASE_TABLET | Freq: Two times a day (BID) | ORAL | 3 refills | Status: DC

## 2021-03-16 NOTE — Telephone Encounter (Signed)
Spoke with husband, discussed recent lab lab result.  Her Keppra level was low at 6.8 and her Vimpat level was low at 4.6.  Husband reports patient is still somnolent, does not want to do anything, stay in bed all day.  No additional seizure reported.  He mentioned the symptom got worse after starting Vimpat.  I will discontinue the Vimpat and start patient on Depakote 500 mg twice daily.  I will see her in 2 weeks on November 7 for reassessment.   Plan Discontinue Vimpat Start Depakote 500 mg twice daily Continue with Keppra 1000 mg twice daily    Alric Ran, MD

## 2021-03-18 ENCOUNTER — Other Ambulatory Visit: Payer: PRIVATE HEALTH INSURANCE

## 2021-03-28 ENCOUNTER — Encounter: Payer: Self-pay | Admitting: Neurology

## 2021-03-28 ENCOUNTER — Ambulatory Visit: Payer: No Typology Code available for payment source | Admitting: Neurology

## 2021-03-28 ENCOUNTER — Ambulatory Visit (INDEPENDENT_AMBULATORY_CARE_PROVIDER_SITE_OTHER): Payer: No Typology Code available for payment source | Admitting: Neurology

## 2021-03-28 VITALS — BP 149/85 | HR 101 | Ht 64.0 in | Wt 178.0 lb

## 2021-03-28 DIAGNOSIS — R569 Unspecified convulsions: Secondary | ICD-10-CM

## 2021-03-28 DIAGNOSIS — G936 Cerebral edema: Secondary | ICD-10-CM | POA: Diagnosis not present

## 2021-03-28 DIAGNOSIS — G510 Bell's palsy: Secondary | ICD-10-CM

## 2021-03-28 DIAGNOSIS — G9389 Other specified disorders of brain: Secondary | ICD-10-CM | POA: Diagnosis not present

## 2021-03-28 MED ORDER — METHYLPHENIDATE HCL 5 MG PO TABS: 5.0000 mg | ORAL_TABLET | Freq: Two times a day (BID) | ORAL | 0 refills | Status: DC

## 2021-03-28 MED ORDER — LEVETIRACETAM 500 MG PO TABS: 500.0000 mg | ORAL_TABLET | Freq: Two times a day (BID) | ORAL | 3 refills | Status: DC

## 2021-03-28 NOTE — Patient Instructions (Signed)
Decrease Keppra to 500 mg BID  Continue with Depakote 500 mg BID  Start with Ritalin 5 mg with breakfast, can increase to 5 mg twice a day (breakfast and Lunch)  24 hrs ambulatory EEG  Referral to physical therapy for deconditioning and gait training  Return in 4 weeks, If Melissa Case has a seizure, please give an extra 1000 Keppra and call the office

## 2021-03-28 NOTE — Progress Notes (Signed)
[facility]  PATIENT: Melissa Case DOB: December 20, 1960  REFERRING CLINICIAN: Camillia Herter, NP HISTORY FROM: Patient and Husband  REASON FOR VISIT: Seizure    HISTORICAL  CHIEF COMPLAINT:  Chief Complaint  Patient presents with   Follow-up    Rm 12, states Depakote is not working well, c/o mood changes     INTERVAL HISTORY 03/28/2021:  Patient presented for follow-up with her husband, last visit was on October 10, denies any additional seizures since last visit.  At that time I checked both for Keppra and lacosamide level and they were within normal limits.  Per husband she continued to have daytime somnolence therefore I discontinued the lacosamide and added Depakote 500 mg twice daily.  Patient continued to describe daytime somnolence.  Patient only gets up to eat and go back to sleep, she is more deconditioned and lately has been acting not like her normal self, stating that she wanted to pack and leave.  She continues on Decadron.  Again no seizures since last visit.  They have follow-up with neurosurgery next month.   INTERVAL HISTORY 03/08/2021: Patient present today for follow-up with husband.  Last visit was October 5 at that time we increase the Keppra to 1000 mg twice daily due to recent seizure.  Since being home has been noted that patient has been sleeping all the time, she also complains of feeling hot all the time.  There are periods of confusion, she does not know where she is, husband described no energy and she has been laying down all day to the point that she developed bedsores.  He is treating them with Neosporin.  Denies any additional seizures since last visit.  They contacted me yesterday with his new complaint therefore I brought him in today for further checkup and to obtain blood work.     INTERVAL HISTORY 02/23/2021:  Patient presents today for follow-up with her husband.  Since last visit she has presented to the ED for additional seizures,  MRI done at that time showed worsening of the edema despite dexamethasone.  She was also at that time on Keppra and phenytoin.  I discontinued the phenytoin (enzyme inducer) and started her on lacosamide 100 mg twice daily.  Since being on this current regimen husband has noted possible minor seizures due to the fact that she is confused time.  He reported the last major seizure was on October 2 when she was at work.  She was missing of work and was found an hour or hour and a half later wandering in the parking lot.  She presented to the ED where she had a head CT showing slight improvement of the edema.  A Keppra level was obtained and it was 10.4.  Lacosamide level was not obtained.  She reported compliance with her medication.  She has been working but on limited duty but due to recent seizure and wandering outside the parking lot, husband was asking if she can be out of work until her condition improves.  He denies any side effect from the medication.      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  Patient stated she had a first lifetime seizure on June 7 of this year.  Per husband, on the day she was not acting like her normal self.  She was having trouble with  her right hand.  Husband decided to call 911 and soon after he helped her to the floor because she seems like she was falling. While on the ground she had a generalized shaking episode lasting about a minute and followed by sleepiness and tiredness.  She was taken to [facility] where she was admitted for further workup.  Patient states that she does not remember the seizures and does not remember how she got in the hospital. She was having memory issues in the hospital. In the hospital she was on hooked up on EEG which showed subclinical seizures coming from the right  temporoparietal region.  She was started on levetiracetam and phenytoin.    Patient also had a MRI brain which showed a 2.4 x 1.4 x 0.8 cm peripherally enhancing focus within the inferior right temporal lobe which was new compared to the previous study.  There was also moderate vasogenic edema within the right temporal lobe She was also seen by neurosurgery who recommended a repeat MRI in 2 to 3 months and she was also started on Decadron for vasogenic edema.  Patient reported since leaving the hospital she has not had any additional seizures.  She is on Keppra 1000 mg twice a day and phenytoin 100 mg 3 times a day.  She reports side effects including increase daytime sleepiness, some nausea and also increase anxiety.  She feels like she has to do adjust to the medication and the new diagnosis.  Denies any mood swing denies any dizziness, no falls   Seizure Type:  Possible focal seizure  Current frequency: only one time on June 7   Seizure risk factors: parotid gland tumors s/p radiation, no family seizure history.   Previous ASMs: Levetiracetam, Phenytoin (interaction with steroids), Lacosamide (increase sleepiness)   Currenty ASMs: Levetiracetam, Depakote   Side effects: Sleepiness, nausea, anxiety   Brain images:  MRI Brain:  2.4 x 1.4 x 0.8 cm peripherally enhancing focus within the inferior right temporal lobe, new as compared to the brain MRI of 06/25/2019. Also new from this prior exam, there is moderate vasogenic edema within the right temporal lobe, extending to the right temporal stem. These findings may reflect necrosis and edema related to prior radiation therapy. However, a right temporal lobe metastasis cannot be excluded. Neuro-Oncology consultation is recommended.   Diffusion-weighted signal abnormality within the cortex of the anterior right temporal lobe, within the right hippocampus and within the medial right thalamus. Findings are nonspecific. Primary considerations are  encephalitis (including herpes encephalitis), seizure-related changes or acute ischemia.   Sequela of interval right parotid resection. Persistent asymmetric enhancement of the mastoid segment of the right facial nerve. Residual perineural tumor at this site cannot be excluded.   New from the prior brain MRI, there is subtle asymmetric enhancement along the inferomedial aspect of the right middle cranial fossa, which could reflect additional perineural tumor spread (along the greater superficial petrosal nerve).   Mild chronic small vessel ischemic changes within the cerebral white matter and pons.   Large right mastoid effusion.   Large right maxillary sinus mucous retention cyst.   Previous EEG:  Overnight EEG 11/19/2019  ABNORMALITY - Seizure without clinical signs, right temporoparietal region - Brief ictal-interictal rhythmic discharges, right temporoparietal region (BIRDs) - Lateralized periodic discharges with overriding rhythmic activity ( LPD +) right, maximal temporo-parietal region - Continuous slow, generalized and maximal right temporo-parietal region.   IMPRESSION: This study showed seizures without clinical signs arising from right temporoparietal region, average  2- 3/hour, lasting about 15 to 45 seconds each.  After 1949 on 11/18/2020, EEG showed brief ictal-interictal rhythmic discharges arising from right temporoparietal region in addition to lateralized periodic discharges with overriding rhythmic activity which are on the ictal-interictal continuum with high potential for seizures.  There is also evidence of cortical dysfunction arising from right temporo-parietal region likely secondary to underlying structural abnormality/ mass.  Additionally, there is moderate diffuse encephalopathy, nonspecific etiology.     REVIEW OF SYSTEMS: Full 14 system review of systems performed and negative with exception of: as noted in the HPI  ALLERGIES: Allergies  Allergen Reactions    Morphine Itching   Penicillins Rash    Has patient had a PCN reaction causing immediate rash, facial/tongue/throat swelling, SOB or lightheadedness with hypotension: No Has patient had a PCN reaction causing severe rash involving mucus membranes or skin necrosis: No Has patient had a PCN reaction that required hospitalization No Has patient had a PCN reaction occurring within the last 10 years: No If all of the above answers are "NO", then may proceed with Cephalosporin use.     HOME MEDICATIONS: Outpatient Medications Prior to Visit  Medication Sig Dispense Refill   acetaminophen (TYLENOL) 325 MG tablet Take 650 mg by mouth every 6 (six) hours as needed for mild pain or headache.     carboxymethylcellulose (REFRESH PLUS) 0.5 % SOLN Place 1 drop into both eyes daily as needed (for dry eyes).     dexamethasone (DECADRON) 4 MG tablet Take 1 tablet (4 mg total) by mouth every 6 (six) hours. 120 tablet 0   divalproex (DEPAKOTE) 500 MG DR tablet Take 1 tablet (500 mg total) by mouth 2 (two) times daily. 60 tablet 3   docusate sodium (COLACE) 100 MG capsule Take 100 mg by mouth daily as needed for mild constipation (uses once in a while).     esomeprazole (NEXIUM) 40 MG capsule Take 1 capsule (40 mg total) by mouth daily. 90 capsule 3   mupirocin cream (BACTROBAN) 2 % Apply 1 application topically 2 (two) times daily. 30 g 1   PARoxetine (PAXIL) 40 MG tablet Take 1 tablet (40 mg total) by mouth daily. 90 tablet 1   levETIRAcetam (KEPPRA) 1000 MG tablet Take 1 tablet (1,000 mg total) by mouth 2 (two) times daily. 60 tablet 3   No facility-administered medications prior to visit.    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   Depression    GERD (gastroesophageal reflux disease)    Panic attack    Seizures ([place])     PAST SURGICAL HISTORY: Past Surgical History:  Procedure Laterality Date   ABDOMINAL HYSTERECTOMY     CHOLECYSTECTOMY N/A  11/21/2017   Procedure: LAPAROSCOPIC CHOLECYSTECTOMY WITH INTRAOPERATIVE CHOLANGIOGRAM;  Surgeon: Jovita Kussmaul, MD;  Location: [facility];  Service: General;  Laterality: N/A;   MULTIPLE EXTRACTIONS WITH ALVEOLOPLASTY N/A 08/28/2019   Procedure: Extraction of tooth #'s 3-7, 10-15, and 20-31 with alveoloplatsy, maxillary right buccal exostosis reduction, and bilateral mandibular tori reductions.;  Surgeon: Lenn Cal, DDS;  Location: [facility];  Service: Oral Surgery;  Laterality: N/A;   PAROTIDECTOMY Right 07/22/2019   with biopsy; done at [facility] by Dr. Fredricka Bonine    FAMILY HISTORY: Family History  Problem Relation Age of Onset   Depression Sister    Heart disease Mother    Diabetes Father    Breast cancer Paternal Grandmother    Colon  cancer Neg Hx    Pancreatic cancer Neg Hx    Esophageal cancer Neg Hx    Stomach cancer Neg Hx    Rectal cancer Neg Hx    Liver cancer Neg Hx     SOCIAL HISTORY: Social History   Socioeconomic History   Marital status: Married    Spouse name: Richard   Number of children: 4   Years of education: Not on file   Highest education level: Not on file  Occupational History   Not on file  Tobacco Use   Smoking status: Every Day    Packs/day: 0.50    Years: 42.00    Pack years: 21.00    Types: Cigarettes    Start date: 1978   Smokeless tobacco: Never  Vaping Use   Vaping Use: Never used  Substance and Sexual Activity   Alcohol use: No    Alcohol/week: 0.0 standard drinks   Drug use: No   Sexual activity: Yes    Birth control/protection: None  Other Topics Concern   Not on file  Social History Narrative   Lives with husband   Right Handed   Drinks 6-7 cups caffeine daily   Social Determinants of Health   Financial Resource Strain: Not on file  Food Insecurity: Not on file  Transportation Needs: Not on file  Physical Activity: Not on file  Stress: Not on file  Social Connections: Not on file  Intimate Partner Violence: Not  on file     PHYSICAL EXAM  GENERAL EXAM/CONSTITUTIONAL: Vitals:  Vitals:   03/28/21 0849  BP: (!) 149/85  Pulse: (!) 101  Weight: 178 lb (80.7 kg)  Height: 5\' 4"  (1.626 m)     Body mass index is 30.55 kg/m. Wt Readings from Last 3 Encounters:  03/28/21 178 lb (80.7 kg)  03/11/21 163 lb 9.6 oz (74.2 kg)  03/08/21 156 lb (70.8 kg)   Patient is in no distress; well developed, nourished and groomed; neck is supple  EYES: Pupils round and reactive to light, Visual fields full to confrontation, Extraocular movements intacts,   MUSCULOSKELETAL: Gait, strength, tone, movements noted in Neurologic exam below  NEUROLOGIC: MENTAL STATUS:  awake, alert, oriented to person, place and year but not date or month, one word answer Able to state the day of the week backward, able to calculate the number of quarters in $1.75 language fluent, comprehension intact, naming intact fund of knowledge appropriate  CRANIAL NERVE:  2nd, 3rd, 4th, 6th - pupils equal and reactive to light, visual fields full to confrontation, extraocular muscles intact, no nystagmus 5th - facial sensation symmetric 7th - There is right facial nerve weakness  8th - hearing intact 9th - palate elevates symmetrically, uvula midline 11th - shoulder shrug symmetric 12th - tongue protrusion midline  MOTOR:  normal bulk and tone, full strength in the BUE, BLE  SENSORY:  normal and symmetric to light touch, pinprick, temperature, vibration  COORDINATION:  finger-nose-finger, fine finger movements normal  GAIT/STATION:  Stoop posture, mild shuffling of gait.    DIAGNOSTIC DATA (LABS, IMAGING, TESTING) - I reviewed patient records, labs, notes, testing and imaging myself where available.  Lab Results  Component Value Date   WBC 7.7 02/20/2021   HGB 13.7 02/20/2021   HCT 40.8 02/20/2021   MCV 92.5 02/20/2021   PLT 180 02/20/2021      Component Value Date/Time   NA 136 02/20/2021 1424   NA 140  07/23/2017 0820   K 4.1 02/20/2021 1424  CL 98 02/20/2021 1424   CO2 28 02/20/2021 1424   GLUCOSE 95 02/20/2021 1424   BUN 11 02/20/2021 1424   BUN 11 07/23/2017 0820   CREATININE 0.52 02/20/2021 1424   CALCIUM 8.9 02/20/2021 1424   PROT 5.6 (L) 02/20/2021 1424   PROT 7.0 07/23/2017 0820   ALBUMIN 3.2 (L) 02/20/2021 1424   ALBUMIN 4.4 07/23/2017 0820   AST 25 02/20/2021 1424   ALT 51 (H) 02/20/2021 1424   ALKPHOS 35 (L) 02/20/2021 1424   BILITOT 0.4 02/20/2021 1424   BILITOT 0.2 07/23/2017 0820   GFRNONAA >60 02/20/2021 1424   GFRAA >60 08/26/2019 0830   No results found for: CHOL, HDL, LDLCALC, LDLDIRECT, TRIG, CHOLHDL Lab Results  Component Value Date   HGBA1C 5.8 (H) 11/17/2020   No results found for: VITAMINB12 Lab Results  Component Value Date   TSH 0.969 12/30/2020   Keppra level  10/2: 10.4 10/18: 6.8  Lacosamide level  10/5: 6.7 10/18: 4.6   Head CT 02/20/2021: 1. No acute findings. 2. Slight interval improvement of changes over the right temporal lobe compatible with known mass with edema in this region.    MRI Brain 12/30/2020 1. Interval increase in size of enhancing lesion at the anterior right temporal lobe, now measuring 0.8 x 1.5 x 2.5 cm. Associated vasogenic edema within the adjacent right temporal region has also slightly worsened. 2. No other acute intracranial abnormality. 3. Right mastoid effusion.   I personally reviewed Brain images and EEG.    ASSESSMENT AND PLAN  60 y.o. 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  Last visit was October 10, at that time she was complaining of increased somnolence.  I have checked her  Keppra and lacosamide and the levels were within normal limits.  Due to increased somnolence and I discontinued the lacosamide and started the patient on Depakote 500 mg twice daily. Per husband patient continued to have daytime somnolence and some mild mood swing.  Still no seizures.  Due to the increased somnolence and mood swing I have proposed the following: Decrease Keppra to 500 twice daily, continue with Depakote 500 mg twice daily, start Ritalin 5 mg daily, can increase to 5 mg twice daily and referral to physical therapy.  I will also Obtain a 24-hour EEG to rule out nonconvulsive seizures.   I will see the patient in 4 weeks for follow-up, I have advised the patient and her husband if she has a seizure with this decrease Keppra to go ahead and give her extra dose 1000 mg of Keppra and to contact me.  At next visit, if there is worsening mood swing, continued daytime somnolence despite decreasing the Keppra and starting Ritalin, I will discontinue the Depakote.  Next medication to try is lacosamide, after lacosamide additional medications include Zonigran, clobazam, but had a side effect of somnolence, perampanel but also have psychiatric side effects, cannot try carbamazepine or related medications due to interaction with steroids.  I also informed the patient that steroid can also cause mood swing, it has psychiatric side effects.  She is supposed to follow-up with her neurosurgeon in the next month, plan for a repeat MRI and if there is  improvement, possibly taper the steroid.    1. Seizure ([place])   2. Right temporal lobe mass   3. Vasogenic cerebral edema ([place])   4. Facial paralysis      PLAN: Decrease Keppra to 500 mg BID  Continue with Depakote 500 mg BID  Start with Ritalin 5 mg with breakfast, can increase to 5 mg twice a day (breakfast and Lunch)  24 hrs ambulatory EEG  Referral to physical therapy for deconditioning and gait training  Return in 4 weeks, If Sandara has a  seizure, please give an extra 1000 Keppra and call the office    Per [facility] statutes, patients with seizures are not allowed to drive until they have been seizure-free for six months.  Other recommendations include using caution when using heavy equipment or power tools. Avoid working on ladders or at heights. Take showers instead of baths.  Do not swim alone.  Ensure the water temperature is not too high on the home water heater. Do not go swimming alone. Do not lock yourself in a room alone (i.e. bathroom). When caring for infants or small children, sit down when holding, feeding, or changing them to minimize risk of injury to the child in the event you have a seizure. Maintain good sleep hygiene. Avoid alcohol.  Also recommend adequate sleep, hydration, good diet and minimize stress.   Orders Placed This Encounter  Procedures   Ambulatory referral to Physical Therapy   AMBULATORY EEG       Meds ordered this encounter  Medications   methylphenidate (RITALIN) 5 MG tablet    Sig: Take 1 tablet (5 mg total) by mouth 2 (two) times daily.    Dispense:  60 tablet    Refill:  0       Return in about 4 weeks (around 04/25/2021).    Alric Ran, MD 03/28/2021, 9:31 AM  [facility] [address], [place], [place] [address] [phone]

## 2021-03-29 ENCOUNTER — Other Ambulatory Visit (HOSPITAL_COMMUNITY): Payer: Self-pay | Admitting: Neurosurgery

## 2021-03-29 DIAGNOSIS — Y842 Radiological procedure and radiotherapy as the cause of abnormal reaction of the patient, or of later complication, without mention of misadventure at the time of the procedure: Secondary | ICD-10-CM

## 2021-03-29 DIAGNOSIS — I6789 Other cerebrovascular disease: Secondary | ICD-10-CM

## 2021-04-01 ENCOUNTER — Other Ambulatory Visit: Payer: Self-pay

## 2021-04-01 ENCOUNTER — Telehealth (HOSPITAL_BASED_OUTPATIENT_CLINIC_OR_DEPARTMENT_OTHER): Payer: PRIVATE HEALTH INSURANCE | Admitting: Psychiatry

## 2021-04-01 ENCOUNTER — Encounter (HOSPITAL_COMMUNITY): Payer: Self-pay | Admitting: Psychiatry

## 2021-04-01 DIAGNOSIS — F419 Anxiety disorder, unspecified: Secondary | ICD-10-CM

## 2021-04-01 DIAGNOSIS — F330 Major depressive disorder, recurrent, mild: Secondary | ICD-10-CM

## 2021-04-01 MED ORDER — PAROXETINE HCL 40 MG PO TABS: 40.0000 mg | ORAL_TABLET | Freq: Every day | ORAL | 1 refills | Status: DC

## 2021-04-01 NOTE — Progress Notes (Signed)
Virtual Visit via Telephone Note  I connected with Melissa Case on 04/01/21 at  9:40 AM EST by telephone and verified that I am speaking with the correct person using two identifiers.  Location: Patient: Home Provider: Home Office   I discussed the limitations, risks, security and privacy concerns of performing an evaluation and management service by telephone and the availability of in person appointments. I also discussed with the patient that there may be a patient responsible charge related to this service. The patient expressed understanding and agreed to proceed.   History of Present Illness: Patient is evaluated by phone session.  She had another seizure-like activity and of October and she was taken to the hospital.  She is taking Paxil but lately neurologist added Ritalin and trying to adjust the seizure medicine.  Patient had episodes of forgetfulness, confusion and gets sometimes irritated.  She sleeps too much and feels tired with lack of energy.  Her husband Melissa Case was also in the session we explain and provide more information about the patient as patient appears to be tired and sleepy.  Patient is no longer driving due to seizure episodes and not back to work since October.  Her husband's main concern is fatigue and episodes of confusion which could be due to tumor related swelling in the brain.  Her husband told that physicians are trying to change the medication and need more work-up if patient is a candidate for surgery.  Husband endorse that patient denies any anger, crying spells, suicidal thoughts or any paranoia.  She takes the Paxil every day.  She was given steroids lately and she had gained weight.  She has no tremor or shakes.   Past Psychiatric History:  H/O anxiety and depression. No h/o inpatient treatment, suicidal attempt, or psychosis. Trazodone worked.    Psychiatric Specialty Exam: Physical Exam  Review of Systems  Weight 178 lb (80.7 kg).Body mass index is  30.55 kg/m.  General Appearance: NA  Eye Contact:  NA  Speech:  Slow  Volume:  Decreased  Mood:   tired  Affect:  NA  Thought Process:  Descriptions of Associations: Loose  Orientation:  Full (Time, Place, and Person)  Thought Content:  Rumination  Suicidal Thoughts:  No  Homicidal Thoughts:  No  Memory:  Immediate;   Fair Recent;   Fair Remote;   Fair  Judgement:  Fair  Insight:  Fair  Psychomotor Activity:  NA  Concentration:  Concentration: Fair and Attention Span: Fair  Recall:  [employer] of Knowledge:  Fair  Language:  Fair  Akathisia:  No  Handed:  Right  AIMS (if indicated):     Assets:  Desire for Improvement Housing Social Support  ADL's:  Intact  Cognition:  Impaired,  Mild  Sleep:   too much      Assessment and Plan: Major depressive disorder, recurrent.  Anxiety.  Review medical records.  Since patient has second seizure she has episodes of confusion and forgetfulness.  Her neurologist trying to adjust the medication and now patient's taking Depakote along with Keppra.  She also started on Ritalin to help with the energy.  I discussed that Ritalin is a stimulant and sometimes can cause insomnia, increased anxiety and palpitation and worsening of mood.  I recommended if patient do not see any improvement with the Ritalin in 2 weeks then it is safe to discontinue due to the concerns and side effects.  We also talked about Depakote also helps the mood but  can be sedated.  Due to complex physical health causing episodes of confusion we will not add any more medication at this time.  Continue Paxil 40 mg daily.  Patient had stopped working for at least another 6 months until her condition improved.  She is not driving.  Plan discussed with the husband and patient.  I will also forward my note to her neurologist.  I recommended if symptoms get worse then she should call us back immediately.  Follow-up in 3 months.  Follow Up Instructions:    I discussed the  assessment and treatment plan with the patient. The patient was provided an opportunity to ask questions and all were answered. The patient agreed with the plan and demonstrated an understanding of the instructions.   The patient was advised to call back or seek an in-person evaluation if the symptoms worsen or if the condition fails to improve as anticipated.  I provided 40 minutes of non-face-to-face time during this encounter.   Kathlee Nations, MD

## 2021-04-12 ENCOUNTER — Telehealth: Payer: Self-pay | Admitting: Neurology

## 2021-04-12 NOTE — Telephone Encounter (Signed)
Ambulatory EEG order has been sent to [facility].

## 2021-04-13 ENCOUNTER — Ambulatory Visit (HOSPITAL_COMMUNITY)
Admission: RE | Admit: 2021-04-13 | Discharge: 2021-04-13 | Disposition: A | Discharge status: Home / Self Care | Payer: PRIVATE HEALTH INSURANCE | Source: Ambulatory Visit | Attending: Neurosurgery | Admitting: Neurosurgery

## 2021-04-13 ENCOUNTER — Other Ambulatory Visit: Payer: Self-pay

## 2021-04-13 DIAGNOSIS — T66XXXS Radiation sickness, unspecified, sequela: Secondary | ICD-10-CM | POA: Insufficient documentation

## 2021-04-13 DIAGNOSIS — I6789 Other cerebrovascular disease: Secondary | ICD-10-CM | POA: Insufficient documentation

## 2021-04-13 DIAGNOSIS — Y842 Radiological procedure and radiotherapy as the cause of abnormal reaction of the patient, or of later complication, without mention of misadventure at the time of the procedure: Secondary | ICD-10-CM | POA: Insufficient documentation

## 2021-04-13 MED ORDER — GADOBUTROL 1 MMOL/ML IV SOLN
8.0000 mL | Freq: Once | INTRAVENOUS | Status: AC | PRN
  Administered 2021-04-13: 8 mL via INTRAVENOUS

## 2021-04-14 ENCOUNTER — Encounter (HOSPITAL_COMMUNITY): Payer: Self-pay | Admitting: *Deleted

## 2021-04-14 ENCOUNTER — Emergency Department (HOSPITAL_COMMUNITY): Payer: PRIVATE HEALTH INSURANCE

## 2021-04-14 ENCOUNTER — Emergency Department (HOSPITAL_COMMUNITY)
Admission: EM | Admit: 2021-04-14 | Discharge: 2021-04-14 | Disposition: A | Discharge status: Home / Self Care | Payer: PRIVATE HEALTH INSURANCE | Attending: Emergency Medicine | Admitting: Emergency Medicine

## 2021-04-14 ENCOUNTER — Other Ambulatory Visit: Payer: Self-pay

## 2021-04-14 DIAGNOSIS — R519 Headache, unspecified: Secondary | ICD-10-CM | POA: Insufficient documentation

## 2021-04-14 DIAGNOSIS — Z79899 Other long term (current) drug therapy: Secondary | ICD-10-CM | POA: Insufficient documentation

## 2021-04-14 DIAGNOSIS — D696 Thrombocytopenia, unspecified: Secondary | ICD-10-CM

## 2021-04-14 DIAGNOSIS — R0981 Nasal congestion: Secondary | ICD-10-CM | POA: Diagnosis not present

## 2021-04-14 DIAGNOSIS — R110 Nausea: Secondary | ICD-10-CM | POA: Diagnosis present

## 2021-04-14 DIAGNOSIS — Z85818 Personal history of malignant neoplasm of other sites of lip, oral cavity, and pharynx: Secondary | ICD-10-CM | POA: Insufficient documentation

## 2021-04-14 DIAGNOSIS — R0602 Shortness of breath: Secondary | ICD-10-CM | POA: Insufficient documentation

## 2021-04-14 DIAGNOSIS — J209 Acute bronchitis, unspecified: Secondary | ICD-10-CM

## 2021-04-14 DIAGNOSIS — R059 Cough, unspecified: Secondary | ICD-10-CM | POA: Diagnosis not present

## 2021-04-14 DIAGNOSIS — Z20822 Contact with and (suspected) exposure to covid-19: Secondary | ICD-10-CM | POA: Diagnosis not present

## 2021-04-14 DIAGNOSIS — Z87891 Personal history of nicotine dependence: Secondary | ICD-10-CM | POA: Insufficient documentation

## 2021-04-14 LAB — COMPREHENSIVE METABOLIC PANEL
ALT: 58 U/L — ABNORMAL HIGH (ref 0–44)
AST: 22 U/L (ref 15–41)
Albumin: 2.8 g/dL — ABNORMAL LOW (ref 3.5–5.0)
Alkaline Phosphatase: 34 U/L — ABNORMAL LOW (ref 38–126)
Anion gap: 10 (ref 5–15)
BUN: 14 mg/dL (ref 6–20)
CO2: 25 mmol/L (ref 22–32)
Calcium: 8.4 mg/dL — ABNORMAL LOW (ref 8.9–10.3)
Chloride: 98 mmol/L (ref 98–111)
Creatinine, Ser: 0.57 mg/dL (ref 0.44–1.00)
GFR, Estimated: >60 mL/min (ref >60)
Glucose, Bld: 161 mg/dL — ABNORMAL HIGH (ref 70–99)
Potassium: 3.9 mmol/L (ref 3.5–5.1)
Sodium: 133 mmol/L — ABNORMAL LOW (ref 135–145)
Total Bilirubin: 0.4 mg/dL (ref 0.3–1.2)
Total Protein: 5 g/dL — ABNORMAL LOW (ref 6.5–8.1)

## 2021-04-14 LAB — CBC WITH DIFFERENTIAL/PLATELET
Abs Immature Granulocytes: 0.42 10*3/uL — ABNORMAL HIGH (ref 0.00–0.07)
Basophils Absolute: 0 10*3/uL (ref 0.0–0.1)
Basophils Relative: 0 %
Eosinophils Absolute: 0 10*3/uL (ref 0.0–0.5)
Eosinophils Relative: 0 %
HCT: 33.3 % — ABNORMAL LOW (ref 36.0–46.0)
Hemoglobin: 11.3 g/dL — ABNORMAL LOW (ref 12.0–15.0)
Immature Granulocytes: 8 %
Lymphocytes Relative: 7 %
Lymphs Abs: 0.4 10*3/uL — ABNORMAL LOW (ref 0.7–4.0)
MCH: 32.5 pg (ref 26.0–34.0)
MCHC: 33.9 g/dL (ref 30.0–36.0)
MCV: 95.7 fL (ref 80.0–100.0)
Monocytes Absolute: 0.3 10*3/uL (ref 0.1–1.0)
Monocytes Relative: 6 %
Neutro Abs: 4 10*3/uL (ref 1.7–7.7)
Neutrophils Relative %: 79 %
Platelets: 95 10*3/uL — ABNORMAL LOW (ref 150–400)
RBC: 3.48 MIL/uL — ABNORMAL LOW (ref 3.87–5.11)
RDW: 15.9 % — ABNORMAL HIGH (ref 11.5–15.5)
WBC: 5.1 10*3/uL (ref 4.0–10.5)
nRBC: 0.4 % — ABNORMAL HIGH (ref 0.0–0.2)

## 2021-04-14 LAB — LIPASE, BLOOD: Lipase: 34 U/L (ref 11–51)

## 2021-04-14 LAB — RESP PANEL BY RT-PCR (FLU A&B, COVID) ARPGX2
Influenza A by PCR: NEGATIVE
Influenza B by PCR: NEGATIVE
SARS Coronavirus 2 by RT PCR: NEGATIVE

## 2021-04-14 LAB — TROPONIN I (HIGH SENSITIVITY): Troponin I (High Sensitivity): 11 ng/L (ref <18)

## 2021-04-14 MED ORDER — ONDANSETRON 4 MG PO TBDP: 4.0000 mg | ORAL_TABLET | Freq: Three times a day (TID) | ORAL | 0 refills | Status: DC | PRN

## 2021-04-14 MED ORDER — KETOROLAC TROMETHAMINE 15 MG/ML IJ SOLN
15.0000 mg | Freq: Once | INTRAMUSCULAR | Status: AC
  Administered 2021-04-14: 15 mg via INTRAVENOUS
  Filled 2021-04-14: qty 1

## 2021-04-14 MED ORDER — ONDANSETRON HCL 4 MG/2ML IJ SOLN
4.0000 mg | Freq: Once | INTRAMUSCULAR | Status: AC
  Administered 2021-04-14: 4 mg via INTRAVENOUS
  Filled 2021-04-14: qty 2

## 2021-04-14 MED ORDER — IPRATROPIUM-ALBUTEROL 0.5-2.5 (3) MG/3ML IN SOLN
3.0000 mL | Freq: Once | RESPIRATORY_TRACT | Status: AC
  Administered 2021-04-14: 3 mL via RESPIRATORY_TRACT
  Filled 2021-04-14: qty 3

## 2021-04-14 MED ORDER — ALBUTEROL SULFATE HFA 108 (90 BASE) MCG/ACT IN AERS
2.0000 | INHALATION_SPRAY | Freq: Once | RESPIRATORY_TRACT | Status: AC
  Administered 2021-04-14: 2 via RESPIRATORY_TRACT
  Filled 2021-04-14: qty 6.7

## 2021-04-14 NOTE — ED Provider Notes (Signed)
[facility] EMERGENCY DEPARTMENT Provider Note   CSN: [id number] Arrival date & time: 04/14/21  2050     History Chief Complaint  Patient presents with   Nausea    Melissa Case is a 60 y.o. female who presents with concern for dry cough, headache, and nausea without V/D/Fevers for the last 24 hours. Known sick contact with her sister recently experiencing the same symptoms. Denies chest pain, but endorses some chest tightness. Denies any urinary symptoms that are new for her.   She has been vaccinated for COVID and influenza. Long term history of smoking, with cessation a few months ago. Vaping since smoking cessation.   I have personally reviewed this patient's medical records. 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 She is not anticoagulated and denies any cardiac history.   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    Past Surgical History:  Procedure Laterality Date    ABDOMINAL HYSTERECTOMY     CHOLECYSTECTOMY N/A 11/21/2017   Procedure: LAPAROSCOPIC CHOLECYSTECTOMY WITH INTRAOPERATIVE CHOLANGIOGRAM;  Surgeon: Jovita Kussmaul, MD;  Location: [facility];  Service: General;  Laterality: N/A;   MULTIPLE EXTRACTIONS WITH ALVEOLOPLASTY N/A 08/28/2019   Procedure: Extraction of tooth #'s 3-7, 10-15, and 20-31 with alveoloplatsy, maxillary right buccal exostosis reduction, and bilateral mandibular tori reductions.;  Surgeon: Lenn Cal, DDS;  Location: [facility];  Service: Oral Surgery;  Laterality: N/A;   PAROTIDECTOMY Right 07/22/2019   with biopsy; done at [facility] by Dr. Fredricka Bonine     OB History   No obstetric history on file.     Family History  Problem Relation Age of Onset   Depression Sister    Heart disease Mother    Diabetes Father    Breast cancer Paternal Grandmother    Colon cancer Neg Hx    Pancreatic cancer Neg Hx    Esophageal cancer Neg Hx    Stomach cancer Neg Hx    Rectal cancer Neg Hx    Liver cancer Neg Hx     Social History   Tobacco Use   Smoking status: Former    Packs/day: 0.50    Years: 42.00    Pack years: 21.00    Types: Cigarettes    Start date: 11    Quit date: 01/16/2021    Years since quitting: 0.2   Smokeless tobacco: Never  Vaping Use   Vaping Use: Every day  Substance Use Topics   Alcohol use:  No    Alcohol/week: 0.0 standard drinks   Drug use: No    Home Medications Prior to Admission medications   Medication Sig Start Date End Date Taking? Authorizing Provider  acetaminophen (TYLENOL) 325 MG tablet Take 650 mg by mouth every 6 (six) hours as needed for mild pain or headache.    [provider]  carboxymethylcellulose (REFRESH PLUS) 0.5 % SOLN Place 1 drop into both eyes daily as needed (for dry eyes).    [provider]  dexamethasone (DECADRON) 4 MG tablet Take 1 tablet (4 mg total) by mouth every 6 (six) hours. 01/02/21   Pokhrel, Corrie Mckusick, MD  divalproex (DEPAKOTE) 500 MG  DR tablet Take 1 tablet (500 mg total) by mouth 2 (two) times daily. 03/16/21 04/15/21  Alric Ran, MD  docusate sodium (COLACE) 100 MG capsule Take 100 mg by mouth daily as needed for mild constipation (uses once in a while).    [provider]  esomeprazole (NEXIUM) 40 MG capsule Take 1 capsule (40 mg total) by mouth daily. 05/28/20   Thornton Park, MD  levETIRAcetam (KEPPRA) 500 MG tablet Take 1 tablet (500 mg total) by mouth 2 (two) times daily. 03/28/21 04/27/21  Alric Ran, MD  methylphenidate (RITALIN) 5 MG tablet Take 1 tablet (5 mg total) by mouth 2 (two) times daily. 03/28/21 04/27/21  Alric Ran, MD  mupirocin cream (BACTROBAN) 2 % Apply 1 application topically 2 (two) times daily. 03/11/21   Camillia Herter, NP  PARoxetine (PAXIL) 40 MG tablet Take 1 tablet (40 mg total) by mouth daily. 04/01/21   Kathlee Nations, MD    Allergies    Morphine and Penicillins  Review of Systems   Review of Systems  Constitutional:  Positive for activity change and chills. Negative for appetite change, fatigue and fever.  HENT:  Positive for congestion. Negative for rhinorrhea.   Eyes:  Negative for photophobia, pain, redness and visual disturbance.  Respiratory:  Positive for cough, chest tightness and shortness of breath.   Cardiovascular: Negative.   Gastrointestinal:  Positive for nausea. Negative for constipation, diarrhea and vomiting.  Musculoskeletal: Negative.   Skin: Negative.   Neurological:  Positive for headaches. Negative for dizziness and light-headedness.  Hematological: Negative.    Physical Exam Updated Vital Signs BP (!) 148/48 (BP Location: Right Arm)   Pulse 94   Temp 97.6 F (36.4 C) (Oral)   Resp 16   Ht 5\' 4"  (1.626 m)   Wt 80.7 kg   SpO2 96%   BMI 30.55 kg/m   Physical Exam Vitals and nursing note reviewed.  Constitutional:      Appearance: She is obese. She is not toxic-appearing.  HENT:     Head: Normocephalic and atraumatic.       Nose: Congestion present. No rhinorrhea.     Mouth/Throat:     Mouth: Mucous membranes are moist.     Pharynx: Oropharynx is clear. Uvula midline. No oropharyngeal exudate or posterior oropharyngeal erythema.     Tonsils: No tonsillar exudate.  Eyes:     General: Lids are normal. Vision grossly intact.        Right eye: No discharge.        Left eye: No discharge.     Extraocular Movements: Extraocular movements intact.     Conjunctiva/sclera: Conjunctivae normal.     Pupils: Pupils are equal, round, and reactive to light.  Neck:     Trachea: Trachea and phonation normal.  Cardiovascular:  Rate and Rhythm: Normal rate and regular rhythm.     Pulses: Normal pulses.     Heart sounds: Normal heart sounds. No murmur heard. Pulmonary:     Effort: Pulmonary effort is normal. No tachypnea, bradypnea, accessory muscle usage, prolonged expiration, respiratory distress or retractions.     Breath sounds: Examination of the left-upper field reveals wheezing. Examination of the right-middle field reveals wheezing. Examination of the left-middle field reveals wheezing. Examination of the right-lower field reveals decreased breath sounds and wheezing. Examination of the left-lower field reveals decreased breath sounds and wheezing. Decreased breath sounds and wheezing present. No rales.  Chest:     Chest wall: No mass, lacerations, deformity, swelling, tenderness, crepitus or edema.  Abdominal:     General: Bowel sounds are normal. There is no distension.     Palpations: Abdomen is soft.     Tenderness: There is no abdominal tenderness. There is no right CVA tenderness, left CVA tenderness, guarding or rebound.  Musculoskeletal:        General: No deformity.     Cervical back: Normal range of motion and neck supple. No edema, rigidity or crepitus. No pain with movement or spinous process tenderness.     Right lower leg: No edema.  Lymphadenopathy:     Cervical: No cervical adenopathy.  Skin:     General: Skin is warm and dry.     Capillary Refill: Capillary refill takes less than 2 seconds.     Findings: No rash.  Neurological:     General: No focal deficit present.     Mental Status: She is alert and oriented to person, place, and time. Mental status is at baseline.     GCS: GCS eye subscore is 4. GCS verbal subscore is 5. GCS motor subscore is 6.     Gait: Gait is intact. Gait normal.  Psychiatric:        Mood and Affect: Mood normal.    ED Results / Procedures / Treatments   Labs (all labs ordered are listed, but only abnormal results are displayed) Labs Reviewed  RESP PANEL BY RT-PCR (FLU A&B, COVID) ARPGX2  CBC WITH DIFFERENTIAL/PLATELET  COMPREHENSIVE METABOLIC PANEL  LIPASE, BLOOD  TROPONIN I (HIGH SENSITIVITY)    EKG None  Radiology No results found.  Procedures Procedures   Medications Ordered in ED Medications  ondansetron (ZOFRAN) injection 4 mg (has no administration in time range)  ketorolac (TORADOL) 15 MG/ML injection 15 mg (has no administration in time range)  ipratropium-albuterol (DUONEB) 0.5-2.5 (3) MG/3ML nebulizer solution 3 mL (has no administration in time range)    ED Course  I have reviewed the triage vital signs and the nursing notes.  Pertinent labs & imaging results that were available during my care of the patient were reviewed by me and considered in my medical decision making (see chart for details).  Clinical Course as of 04/14/21 2150  Thu Apr 14, 2021  2147 Patient wheel-chair bound, radiology requesting transition to portable x-ray, which I feel is reasonable.  [RS]    Clinical Course User Index [RS] Carly Sabo, Gypsy Balsam, PA-C   MDM Rules/Calculators/A&P                         60 year old female with recent sick contact who presents with concern for headache, cough, and nausea x 24 hours.   Differential includes but is not limited to covid-19, influenza, other acute viral infection, pneumonia, COPD, reactive  airway  disease, ACS, dysrhythmia.   Mildly hypertensive on intake, VS otherwise normal. Cardiopulmonary exam is significant for wheezing throughout the lung fields. Abdominal exam is benign. Neurovascularly intact in all 4 extremities. Patient is non-toxic appearing.   Basic labs ordered given concern for chest tightness/pressure. RPP ordered, duoneb for wheezing, antiemetic and toradol for headache.   Care of this patient signed out to oncoming ED provider Dr. Regenia Skeeter at time of shift change. All pertinent HPI, physical exam findings were discussed with him prior to my departure. Patient pending labs, xray, medications, and reevaluation at time of shift change. I appreciate his collaboration in the care of this patient.   Melissa voiced understanding of her medical evaluation and treatment plan. Each of her questions was answered to her expressed satisfaction.  This chart was dictated using voice recognition software, Dragon. Despite the best efforts of this provider to proofread and correct errors, errors may still occur which can change documentation meaning.   Final Clinical Impression(s) / ED Diagnoses Final diagnoses:  SOB (shortness of breath)    Rx / DC Orders ED Discharge Orders     None        Aura Dials 04/14/21 2150    Sherwood Gambler, MD 04/14/21 2350

## 2021-04-14 NOTE — ED Triage Notes (Signed)
Pt states headache, cough and nausea since yesterday.

## 2021-04-14 NOTE — Discharge Instructions (Signed)
Your platelets were noted to be low today.  It is unclear how long this has been ongoing.  You will need to follow-up with the hematologist listed.  Follow-up with your primary care physician otherwise for the cough and shortness of breath.  You may use the albuterol inhaler 2 puffs every 4 hours as needed for cough and shortness of breath.  If at any point you develop new or worsening symptoms then return to the ER.

## 2021-04-14 NOTE — ED Notes (Signed)
Xray techs at bedside with portable xray

## 2021-04-18 ENCOUNTER — Telehealth: Payer: Self-pay | Admitting: Hematology

## 2021-04-18 NOTE — Telephone Encounter (Signed)
Called pt to sch new hem appt per 11/24 staff msg from Dr. Burr Medico. No answer. Left msg for pt to call back to sch appt.

## 2021-04-19 ENCOUNTER — Telehealth: Payer: Self-pay | Admitting: Neurology

## 2021-04-19 MED ORDER — DIVALPROEX SODIUM 250 MG PO DR TAB: 250.0000 mg | DELAYED_RELEASE_TABLET | Freq: Two times a day (BID) | ORAL | 3 refills | Status: DC

## 2021-04-19 NOTE — Telephone Encounter (Signed)
Pt's husband called thinks wife had seizure on yesterday not a severe one but thinks it was one. Pt's husband thinks the Keppra needs to be increased if possible 3 times a day. Pt's husband requesting a call back.

## 2021-04-19 NOTE — Addendum Note (Signed)
Addended by: Noberto Retort C on: 04/19/2021 04:13 PM   Modules accepted: Orders

## 2021-04-19 NOTE — Telephone Encounter (Addendum)
I spoke to the patient's husband. Reports she was home with her sister yesterday. She went to the bathroom with nausea. She became agitated and irate, complained of weakness and exhibited intermittent moments of confusion. Her sister offered to help her to the bed multiple times. She either did not understand or refused the help. Says this went on for 3-5 hours. Her sister then checked on her again and the patient had crawled to the floor and feel asleep. She allowed her to continue sleeping in the floor. When her husband came home from work, he called EMS to assist him in getting her up. She did not proceed to hospital. Placed in bed to sleep. She woke up the following morning back to her baseline.   She is currently taking levetiracetam 500mg , one tab BID and divalproex DR 500mg , one tab BID. Denies any missed doses of medication.  Per vo by Dr. April Manson, increase divalproex DR to 750mg , one tab BID.  I educated the patient that if he is concerned that a seizure is prolonged for more than five minutes, then she should seek emergency care. He should call our office for any further events.   He was in agreement and verbalized understanding of this plan.   He also needs [place] to check on this patient's ambulatory EEG.

## 2021-04-20 ENCOUNTER — Telehealth: Payer: Self-pay | Admitting: Hematology and Oncology

## 2021-04-20 ENCOUNTER — Emergency Department (HOSPITAL_COMMUNITY): Payer: PRIVATE HEALTH INSURANCE

## 2021-04-20 ENCOUNTER — Encounter (HOSPITAL_COMMUNITY): Payer: Self-pay | Admitting: Emergency Medicine

## 2021-04-20 ENCOUNTER — Emergency Department (HOSPITAL_COMMUNITY)
Admission: EM | Admit: 2021-04-20 | Discharge: 2021-04-21 | Disposition: A | Discharge status: Home / Self Care | Payer: PRIVATE HEALTH INSURANCE | Attending: Physician Assistant | Admitting: Physician Assistant

## 2021-04-20 ENCOUNTER — Other Ambulatory Visit: Payer: Self-pay

## 2021-04-20 DIAGNOSIS — J40 Bronchitis, not specified as acute or chronic: Secondary | ICD-10-CM | POA: Insufficient documentation

## 2021-04-20 DIAGNOSIS — S0990XA Unspecified injury of head, initial encounter: Secondary | ICD-10-CM | POA: Insufficient documentation

## 2021-04-20 DIAGNOSIS — Z20822 Contact with and (suspected) exposure to covid-19: Secondary | ICD-10-CM | POA: Diagnosis not present

## 2021-04-20 DIAGNOSIS — R0689 Other abnormalities of breathing: Secondary | ICD-10-CM | POA: Diagnosis not present

## 2021-04-20 DIAGNOSIS — R5383 Other fatigue: Secondary | ICD-10-CM | POA: Diagnosis not present

## 2021-04-20 DIAGNOSIS — Z5321 Procedure and treatment not carried out due to patient leaving prior to being seen by health care provider: Secondary | ICD-10-CM | POA: Diagnosis not present

## 2021-04-20 DIAGNOSIS — W19XXXA Unspecified fall, initial encounter: Secondary | ICD-10-CM | POA: Diagnosis not present

## 2021-04-20 DIAGNOSIS — M549 Dorsalgia, unspecified: Secondary | ICD-10-CM | POA: Diagnosis not present

## 2021-04-20 DIAGNOSIS — R0602 Shortness of breath: Secondary | ICD-10-CM | POA: Diagnosis not present

## 2021-04-20 DIAGNOSIS — M545 Low back pain, unspecified: Secondary | ICD-10-CM | POA: Diagnosis not present

## 2021-04-20 DIAGNOSIS — R0902 Hypoxemia: Secondary | ICD-10-CM | POA: Diagnosis not present

## 2021-04-20 LAB — CBC
HCT: 34.4 % — ABNORMAL LOW (ref 36.0–46.0)
Hemoglobin: 11.2 g/dL — ABNORMAL LOW (ref 12.0–15.0)
MCH: 32.1 pg (ref 26.0–34.0)
MCHC: 32.6 g/dL (ref 30.0–36.0)
MCV: 98.6 fL (ref 80.0–100.0)
Platelets: 119 10*3/uL — ABNORMAL LOW (ref 150–400)
RBC: 3.49 MIL/uL — ABNORMAL LOW (ref 3.87–5.11)
RDW: 16.1 % — ABNORMAL HIGH (ref 11.5–15.5)
WBC: 6.2 10*3/uL (ref 4.0–10.5)
nRBC: 0.5 % — ABNORMAL HIGH (ref 0.0–0.2)

## 2021-04-20 LAB — BASIC METABOLIC PANEL
Anion gap: 11 (ref 5–15)
BUN: 15 mg/dL (ref 6–20)
CO2: 25 mmol/L (ref 22–32)
Calcium: 8.6 mg/dL — ABNORMAL LOW (ref 8.9–10.3)
Chloride: 101 mmol/L (ref 98–111)
Creatinine, Ser: 0.5 mg/dL (ref 0.44–1.00)
GFR, Estimated: >60 mL/min (ref >60)
Glucose, Bld: 183 mg/dL — ABNORMAL HIGH (ref 70–99)
Potassium: 3.4 mmol/L — ABNORMAL LOW (ref 3.5–5.1)
Sodium: 137 mmol/L (ref 135–145)

## 2021-04-20 LAB — RESP PANEL BY RT-PCR (FLU A&B, COVID) ARPGX2
Influenza A by PCR: NEGATIVE
Influenza B by PCR: NEGATIVE
SARS Coronavirus 2 by RT PCR: NEGATIVE

## 2021-04-20 LAB — TROPONIN I (HIGH SENSITIVITY): Troponin I (High Sensitivity): 7 ng/L (ref <18)

## 2021-04-20 NOTE — Telephone Encounter (Signed)
Called pt to sch new hem appt per 11/24 staff msg from Dr. Alvy Bimler. No answer. Left msg for pt to call back to sch appt.

## 2021-04-20 NOTE — ED Triage Notes (Signed)
Pt here via [facility] from home for fall today. PT was sitting on the toilet and fell off, pt doesn't know how it happened. Pt was dx w/ Bronchitis 1 wk ago, given an inhaler but no antibiotics. Per Ems pt 84% on RA, currently 94% on 4L. 140/90, 100HR, 28 RR, CBG 208. Pt is aox4 but slow to respond, very lethargic in triage. Labored breathing noted.

## 2021-04-20 NOTE — ED Provider Notes (Signed)
Emergency Medicine Provider Triage Evaluation Note  Melissa Case , a 60 y.o. female  was evaluated in triage.  Pt complains of cough and sob. States she fell today. She is c/o pain to the left chest, bilat knee and lower back  Review of Systems  Positive: Cough, sob, left chest, bilat knee and lower back Negative: fever  Physical Exam  BP 138/81   Pulse (!) 103   Temp 98.3 F (36.8 C) (Oral)   Resp (!) 22   SpO2 94%  Gen:   Awake, no distress   Resp:  Normal effort  MSK:   Moves extremities without difficulty  Other:  Wheezing, tachypneic  Medical Decision Making  Medically screening exam initiated at 6:06 PM.  Appropriate orders placed.  Melissa Case was informed that the remainder of the evaluation will be completed by another provider, this initial triage assessment does not replace that evaluation, and the importance of remaining in the ED until their evaluation is complete.     Melissa Case 04/20/21 1810    Melissa Leigh, MD 04/21/21 [phone]

## 2021-04-20 NOTE — Telephone Encounter (Signed)
Pt's husband called again, now states his wife has fallen headed to the ED cause her oxygen level is low.

## 2021-04-21 LAB — TROPONIN I (HIGH SENSITIVITY): Troponin I (High Sensitivity): 9 ng/L (ref <18)

## 2021-04-21 NOTE — ED Notes (Signed)
Patient is leaving without being seen due to wait times. Patient and family informed patients O2 is stating at 89-90% on room air. Both family and patient are aware the risks of leaving without having oxygen at home.

## 2021-04-22 ENCOUNTER — Telehealth: Payer: Self-pay | Admitting: Hematology and Oncology

## 2021-04-22 NOTE — Telephone Encounter (Signed)
Called pt to sch new hem appt per 11/24 staff msg from Dr. Alvy Bimler. No answer. Left msg for pt to call back to sch appt.

## 2021-04-25 LAB — CULTURE, BLOOD (SINGLE): Culture: NO GROWTH

## 2021-04-27 ENCOUNTER — Telehealth (HOSPITAL_COMMUNITY): Payer: No Typology Code available for payment source | Admitting: Psychiatry

## 2021-04-29 DIAGNOSIS — R569 Unspecified convulsions: Secondary | ICD-10-CM

## 2021-04-30 DIAGNOSIS — R569 Unspecified convulsions: Secondary | ICD-10-CM

## 2021-05-02 ENCOUNTER — Ambulatory Visit (INDEPENDENT_AMBULATORY_CARE_PROVIDER_SITE_OTHER): Payer: No Typology Code available for payment source | Admitting: Neurology

## 2021-05-02 ENCOUNTER — Encounter: Payer: Self-pay | Admitting: Neurology

## 2021-05-02 VITALS — BP 121/70 | HR 97 | Ht 64.0 in | Wt 178.0 lb

## 2021-05-02 DIAGNOSIS — G9389 Other specified disorders of brain: Secondary | ICD-10-CM | POA: Diagnosis not present

## 2021-05-02 DIAGNOSIS — R5381 Other malaise: Secondary | ICD-10-CM

## 2021-05-02 DIAGNOSIS — G510 Bell's palsy: Secondary | ICD-10-CM | POA: Diagnosis not present

## 2021-05-02 DIAGNOSIS — R569 Unspecified convulsions: Secondary | ICD-10-CM | POA: Diagnosis not present

## 2021-05-02 DIAGNOSIS — G936 Cerebral edema: Secondary | ICD-10-CM | POA: Diagnosis not present

## 2021-05-02 DIAGNOSIS — Z5181 Encounter for therapeutic drug level monitoring: Secondary | ICD-10-CM

## 2021-05-02 NOTE — Progress Notes (Signed)
[facility]  PATIENT: Melissa Case DOB: Apr 05, 1961  REFERRING CLINICIAN: Camillia Herter, NP HISTORY FROM: Patient and Husband  REASON FOR VISIT: Seizure    HISTORICAL  CHIEF COMPLAINT:  Chief Complaint  Patient presents with   Follow-up    Rm 13. Accompanied by husband, Richard. Pt c/o difficulty opening her mouth on the right side. Pt states medications are working well. No new seizures since last visit. Patient's husband has concerns about her having loss of balance.    INTERVAL HISTORY 05/11/2021: Patient presents to follow-up with husband, last visit was November 7.  At that time plan was to discontinue Lacosamide, decrease Keppra to 500 mg twice daily and to start Depakote 500 mg twice daily due to daytime somnolence.  I have also started her on Ritalin.  Since that visit, husband said the Ritalin has not been helpful therefore he discontinued.  Husband also called on November 29, stating that Amazin has was found down, not sure if he she had a seizure or not, we had increased her valproic acid to 750 twice daily.  Since then, there is there is improvement in his daytime sleepiness, she is more awake and more alert.  No reported additional seizures.  She is compliant with his medication. Brain MRI has been repeated and showing a stable to smaller right temporal lobe lesion and there is also decreased surrounding edema and associated mild mass-effect.  She is scheduled to see neurosurgeon next week. She is still on the steroid.     INTERVAL HISTORY 03/28/2021:  Patient presented for follow-up with her husband, last visit was on October 10, denies any additional seizures since last visit.  At that time I checked both for Keppra and lacosamide level and they were within normal limits.  Per husband she continued to have daytime somnolence therefore I discontinued the lacosamide and added Depakote 500 mg twice daily.  Patient continued to describe daytime  somnolence.  Patient only gets up to eat and go back to sleep, she is more deconditioned and lately has been acting not like her normal self, stating that she wanted to pack and leave.  She continues on Decadron.  Again no seizures since last visit.  They have follow-up with neurosurgery next month.   INTERVAL HISTORY 03/08/2021: Patient present today for follow-up with husband.  Last visit was October 5 at that time we increase the Keppra to 1000 mg twice daily due to recent seizure.  Since being home has been noted that patient has been sleeping all the time, she also complains of feeling hot all the time.  There are periods of confusion, she does not know where she is, husband described no energy and she has been laying down all day to the point that she developed bedsores.  He is treating them with Neosporin.  Denies any additional seizures since last visit.  They contacted me yesterday with his new complaint therefore I brought him in today for further checkup and to obtain blood work.     INTERVAL HISTORY 02/23/2021:  Patient presents today for follow-up with her husband.  Since last visit she has presented to the ED for additional seizures, MRI done at that time showed worsening of the edema despite dexamethasone.  She was also at that time on Keppra and phenytoin.  I discontinued the phenytoin (enzyme inducer) and started her on lacosamide 100 mg twice daily.  Since being on this current regimen husband has noted possible minor seizures due to the  fact that she is confused time.  He reported the last major seizure was on October 2 when she was at work.  She was missing of work and was found an hour or hour and a half later wandering in the parking lot.  She presented to the ED where she had a head CT showing slight improvement of the edema.  A Keppra level was obtained and it was 10.4.  Lacosamide level was not obtained.  She reported compliance with her medication.  She has been working but on  limited duty but due to recent seizure and wandering outside the parking lot, husband was asking if she can be out of work until her condition improves.  He denies any side effect from the medication.      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  Patient stated she had a first lifetime seizure on June 7 of this year.  Per husband, on the day she was not acting like her normal self.  She was having trouble with her right hand.  Husband decided to call 911 and soon after he helped her to the floor because she seems like she was falling. While on the ground she had a generalized shaking episode lasting about a minute and followed by sleepiness and tiredness.  She was taken to [facility] where she was admitted for further workup.  Patient states that she does not remember the seizures and does not remember how she got in the hospital. She was having memory issues in the hospital. In the hospital she was on hooked up on EEG which showed subclinical seizures coming from the right temporoparietal region.  She was started on levetiracetam and phenytoin.    Patient also had a MRI brain which showed a 2.4 x 1.4 x 0.8 cm peripherally enhancing focus within the inferior right temporal lobe which was new compared to the previous study.  There was also moderate vasogenic edema within the right temporal lobe She was also seen by neurosurgery who recommended a repeat MRI in 2 to 3 months and she was also started on Decadron for vasogenic edema.  Patient reported since leaving the hospital she has not had any additional seizures.  She is on Keppra 1000 mg twice a day and phenytoin 100 mg 3 times a day.  She reports side effects including increase daytime sleepiness, some nausea and also increase anxiety.  She feels like she has to do adjust to  the medication and the new diagnosis.  Denies any mood swing denies any dizziness, no falls   Seizure Type:  Possible focal seizure  Current frequency: only one time on June 7   Seizure risk factors: parotid gland tumors s/p radiation, no family seizure history.   Previous ASMs: Levetiracetam, Phenytoin (interaction with steroids), Lacosamide (increase sleepiness)   Currenty ASMs: Levetiracetam, Depakote   Side effects: Sleepiness, nausea, anxiety   Brain images:  MRI Brain:  2.4 x 1.4 x 0.8 cm peripherally enhancing focus within the inferior right temporal lobe, new as compared to the brain MRI of 06/25/2019. Also new from this prior exam, there is moderate vasogenic edema within the right temporal lobe, extending to the right temporal stem. These findings may reflect necrosis and edema related to prior radiation therapy. However, a right temporal lobe metastasis cannot be excluded. Neuro-Oncology consultation is recommended.  Diffusion-weighted signal abnormality within the cortex of the anterior right temporal lobe, within the right hippocampus and within the medial right thalamus. Findings are nonspecific. Primary considerations are encephalitis (including herpes encephalitis), seizure-related changes or acute ischemia.   Sequela of interval right parotid resection. Persistent asymmetric enhancement of the mastoid segment of the right facial nerve. Residual perineural tumor at this site cannot be excluded.   New from the prior brain MRI, there is subtle asymmetric enhancement along the inferomedial aspect of the right middle cranial fossa, which could reflect additional perineural tumor spread (along the greater superficial petrosal nerve).   Mild chronic small vessel ischemic changes within the cerebral white matter and pons.   Large right mastoid effusion.   Large right maxillary sinus mucous retention cyst.   Previous EEG:  Overnight EEG 11/19/2019  ABNORMALITY - Seizure  without clinical signs, right temporoparietal region - Brief ictal-interictal rhythmic discharges, right temporoparietal region (BIRDs) - Lateralized periodic discharges with overriding rhythmic activity ( LPD +) right, maximal temporo-parietal region - Continuous slow, generalized and maximal right temporo-parietal region.   IMPRESSION: This study showed seizures without clinical signs arising from right temporoparietal region, average 2- 3/hour, lasting about 15 to 45 seconds each.  After 1949 on 11/18/2020, EEG showed brief ictal-interictal rhythmic discharges arising from right temporoparietal region in addition to lateralized periodic discharges with overriding rhythmic activity which are on the ictal-interictal continuum with high potential for seizures.  There is also evidence of cortical dysfunction arising from right temporo-parietal region likely secondary to underlying structural abnormality/ mass.  Additionally, there is moderate diffuse encephalopathy, nonspecific etiology.     REVIEW OF SYSTEMS: Full 14 system review of systems performed and negative with exception of: as noted in the HPI  ALLERGIES: Allergies  Allergen Reactions   Morphine Itching   Penicillins Rash    Has patient had a PCN reaction causing immediate rash, facial/tongue/throat swelling, SOB or lightheadedness with hypotension: No Has patient had a PCN reaction causing severe rash involving mucus membranes or skin necrosis: No Has patient had a PCN reaction that required hospitalization No Has patient had a PCN reaction occurring within the last 10 years: No If all of the above answers are "NO", then may proceed with Cephalosporin use.     HOME MEDICATIONS: Outpatient Medications Prior to Visit  Medication Sig Dispense Refill   acetaminophen (TYLENOL) 325 MG tablet Take 650 mg by mouth every 6 (six) hours as needed for mild pain or headache.     carboxymethylcellulose (REFRESH PLUS) 0.5 % SOLN Place 1 drop  into both eyes daily as needed (for dry eyes).     dexamethasone (DECADRON) 4 MG tablet Take 1 tablet (4 mg total) by mouth every 6 (six) hours. 120 tablet 0   divalproex (DEPAKOTE) 250 MG DR tablet Take 1 tablet (250 mg total) by mouth 2 (two) times daily. (In addition to 54m twice daily for a total dosage of 7515mBID). 60 tablet 3   docusate sodium (COLACE) 100 MG capsule Take 100 mg by mouth daily as needed for mild constipation (uses once in a while).     esomeprazole (NEXIUM) 40 MG capsule Take 1 capsule (40 mg total) by mouth daily. 90 capsule 3   mupirocin cream (BACTROBAN) 2 % Apply 1 application topically 2 (two) times daily. 30 g 1   ondansetron (ZOFRAN-ODT) 4 MG disintegrating tablet Take 1 tablet (4 mg total) by mouth every 8 (eight) hours as needed. 10 tablet 0   PARoxetine (PAXIL)  40 MG tablet Take 1 tablet (40 mg total) by mouth daily. 90 tablet 1   divalproex (DEPAKOTE) 500 MG DR tablet Take 1 tablet (500 mg total) by mouth 2 (two) times daily. 60 tablet 3   levETIRAcetam (KEPPRA) 500 MG tablet Take 1 tablet (500 mg total) by mouth 2 (two) times daily. 60 tablet 3   methylphenidate (RITALIN) 5 MG tablet Take 1 tablet (5 mg total) by mouth 2 (two) times daily. 60 tablet 0   No facility-administered medications prior to visit.    PAST MEDICAL HISTORY: Past Medical History:  Diagnosis Date   Cancer ([place])    Partoid   Depression    GERD (gastroesophageal reflux disease)    Panic attack    Seizures ([place])     PAST SURGICAL HISTORY: Past Surgical History:  Procedure Laterality Date   ABDOMINAL HYSTERECTOMY     CHOLECYSTECTOMY N/A 11/21/2017   Procedure: LAPAROSCOPIC CHOLECYSTECTOMY WITH INTRAOPERATIVE CHOLANGIOGRAM;  Surgeon: Jovita Kussmaul, MD;  Location: [facility];  Service: General;  Laterality: N/A;   MULTIPLE EXTRACTIONS WITH ALVEOLOPLASTY N/A 08/28/2019   Procedure: Extraction of tooth #'s 3-7, 10-15, and 20-31 with alveoloplatsy, maxillary right buccal exostosis  reduction, and bilateral mandibular tori reductions.;  Surgeon: Lenn Cal, DDS;  Location: [facility];  Service: Oral Surgery;  Laterality: N/A;   PAROTIDECTOMY Right 07/22/2019   with biopsy; done at [facility] by Dr. Fredricka Bonine    FAMILY HISTORY: Family History  Problem Relation Age of Onset   Depression Sister    Heart disease Mother    Diabetes Father    Breast cancer Paternal Grandmother    Colon cancer Neg Hx    Pancreatic cancer Neg Hx    Esophageal cancer Neg Hx    Stomach cancer Neg Hx    Rectal cancer Neg Hx    Liver cancer Neg Hx     SOCIAL HISTORY: Social History   Socioeconomic History   Marital status: Married    Spouse name: Richard   Number of children: 4   Years of education: Not on file   Highest education level: Not on file  Occupational History   Not on file  Tobacco Use   Smoking status: Former    Packs/day: 0.50    Years: 42.00    Pack years: 21.00    Types: Cigarettes    Start date: 1978    Quit date: 01/16/2021    Years since quitting: 0.2   Smokeless tobacco: Never  Vaping Use   Vaping Use: Every day  Substance and Sexual Activity   Alcohol use: No    Alcohol/week: 0.0 standard drinks   Drug use: No   Sexual activity: Yes    Birth control/protection: None  Other Topics Concern   Not on file  Social History Narrative   Lives with husband   Right Handed   Drinks 6-7 cups caffeine daily   Social Determinants of Health   Financial Resource Strain: Not on file  Food Insecurity: Not on file  Transportation Needs: Not on file  Physical Activity: Not on file  Stress: Not on file  Social Connections: Not on file  Intimate Partner Violence: Not on file     PHYSICAL EXAM  GENERAL EXAM/CONSTITUTIONAL: Vitals:  Vitals:   05/02/21 1313  BP: 121/70  Pulse: 97  Weight: 178 lb (80.7 kg)  Height: 5' 4"  (1.626 m)     Body mass index is 30.55 kg/m. Wt Readings from Last 3 Encounters:  05/02/21  178 lb (80.7 kg)  04/14/21  178 lb (80.7 kg)  03/28/21 178 lb (80.7 kg)   Patient is in no distress; well developed, nourished and groomed; neck is supple. There is bilateral pitting edema up to mid shin.  EYES: Pupils round and reactive to light, Visual fields full to confrontation, Extraocular movements intacts,   MUSCULOSKELETAL: Gait, strength, tone, movements noted in Neurologic exam below  NEUROLOGIC: MENTAL STATUS:  awake, alert, oriented to person, place  Able to state the day of the week backward, able to calculate the number of quarters in $1.75 language fluent, comprehension intact, naming intact fund of knowledge appropriate  CRANIAL NERVE:  2nd, 3rd, 4th, 6th - pupils equal and reactive to light, visual fields full to confrontation, extraocular muscles intact, no nystagmus 5th - facial sensation symmetric 7th - There is right facial nerve paralysis  8th - hearing intact 9th - palate elevates symmetrically, uvula midline 11th - shoulder shrug symmetric 12th - tongue protrusion midline  MOTOR:  normal bulk and tone, full strength in the BUE, BLE  SENSORY:  normal and symmetric to light touch, pinprick, temperature, vibration  COORDINATION:  finger-nose-finger, fine finger movements normal  GAIT/STATION:  In a wheelchair    DIAGNOSTIC DATA (LABS, IMAGING, TESTING) - I reviewed patient records, labs, notes, testing and imaging myself where available.  Lab Results  Component Value Date   WBC 6.2 04/20/2021   HGB 11.2 (L) 04/20/2021   HCT 34.4 (L) 04/20/2021   MCV 98.6 04/20/2021   PLT 119 (L) 04/20/2021      Component Value Date/Time   NA 137 04/20/2021 1813   NA 140 07/23/2017 0820   K 3.4 (L) 04/20/2021 1813   CL 101 04/20/2021 1813   CO2 25 04/20/2021 1813   GLUCOSE 183 (H) 04/20/2021 1813   BUN 15 04/20/2021 1813   BUN 11 07/23/2017 0820   CREATININE 0.50 04/20/2021 1813   CALCIUM 8.6 (L) 04/20/2021 1813   PROT 5.0 (L) 04/14/2021 2134   PROT 7.0 07/23/2017 0820    ALBUMIN 2.8 (L) 04/14/2021 2134   ALBUMIN 4.4 07/23/2017 0820   AST 22 04/14/2021 2134   ALT 58 (H) 04/14/2021 2134   ALKPHOS 34 (L) 04/14/2021 2134   BILITOT 0.4 04/14/2021 2134   BILITOT 0.2 07/23/2017 0820   GFRNONAA >60 04/20/2021 1813   GFRAA >60 08/26/2019 0830   No results found for: CHOL, HDL, LDLCALC, LDLDIRECT, TRIG, CHOLHDL Lab Results  Component Value Date   HGBA1C 5.8 (H) 11/17/2020   No results found for: [id number] Lab Results  Component Value Date   TSH 0.969 12/30/2020   Keppra level  10/2: 10.4 10/18: 6.8  Lacosamide level  10/5: 6.7 10/18: 4.6   Head CT 02/20/2021: 1. No acute findings. 2. Slight interval improvement of changes over the right temporal lobe compatible with known mass with edema in this region.    MRI Brain 12/30/2020 1. Interval increase in size of enhancing lesion at the anterior right temporal lobe, now measuring 0.8 x 1.5 x 2.5 cm. Associated vasogenic edema within the adjacent right temporal region has also slightly worsened. 2. No other acute intracranial abnormality. 3. Right mastoid effusion.   MRI Brain 11/23/20222 Stable to smaller right temporal lobe lesion. Decreased surrounding edema and associated mass effect. Radiation necrosis remains the most likely etiology.  I personally reviewed Brain images and EEG.    ASSESSMENT AND PLAN  60 y.o. 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  She reports improvement of her's symptoms, daytime sleepiness, with Keppra 500 mg twice daily and Depakote 750 mg twice daily.  No additional seizures since last visit.  Her MRI was repeated on November 23 and is showing stable to  smallest size right temporal lobe lesion with decreased surrounding edema and mass-effect.  They will see the neurosurgeon next week.  She still is on steroid.  I will check the ASM level today and likely will continue her on Keppra and Depakote. Today, she was sitting on the wheelchair, she has bilateral lower extremities pitting edema up to mid shin, she also reports some dyspnea on exertion.  She appears deconditioned, I will send her to physical therapy, they would prefer someone to come to their house.  I also advised her to follow-up with her primary care doctor regarding the lower extremities edema and the dyspnea on exertion. Additional medications to consider in the future include Zonegran, clobazam but has side effect of somnolence, perampanel but also have psychiatric side effects.  She is currently on steroids so cannot try the carbamazepine or oxcarbazepine due to interaction with steroids.  I will see her in 3 months for follow-up, advised the patient and husband to contact me if she has another seizure.  I also advised them to send me a MyChart message after her visit with neurosurgery.     1. Seizure ([place])   2. Right temporal lobe mass   3. Vasogenic cerebral edema ([place])   4. Facial paralysis   5. Physical deconditioning       PLAN: Continue current medications   Levetiracetam 500 mg BID   Valproic acid 750 mg BID  Will check ASM level with CMP  Referral to outpatient physical therapy  Follow up with neurosurgery  Follow up in 3 months or sooner if worse     Per [facility] statutes, patients with seizures are not allowed to drive until they have been seizure-free for six months.  Other recommendations include using caution when using heavy equipment or power tools. Avoid working on ladders or at heights. Take showers instead of baths.  Do not swim alone.  Ensure the water temperature is not too high on the home water heater. Do not go swimming alone. Do not lock  yourself in a room alone (i.e. bathroom). When caring for infants or small children, sit down when holding, feeding, or changing them to minimize risk of injury to the child in the event you have a seizure. Maintain good sleep hygiene. Avoid alcohol.  Also recommend adequate sleep, hydration, good diet and minimize stress.   Orders Placed This Encounter  Procedures   Levetiracetam level   Valproic Acid Level   CMP   Ambulatory referral to Physical Therapy        No orders of the defined types were placed in this encounter.      Return in about 3 months (around 07/31/2021).    Alric Ran, MD 05/02/2021, 4:19 PM  [facility] [address] [place], [place] [address] [phone]

## 2021-05-02 NOTE — Patient Instructions (Signed)
Continue current medications   Levetiracetam 500 mg BID   Valproic acid 750 mg BID  Will check ASM level with CMP  Referral to outpatient physical therapy  Follow up with neurosurgery  Follow up in 3 months or sooner if worse

## 2021-05-03 ENCOUNTER — Other Ambulatory Visit: Payer: Self-pay | Admitting: Gastroenterology

## 2021-05-03 DIAGNOSIS — K2270 Barrett's esophagus without dysplasia: Secondary | ICD-10-CM

## 2021-05-03 LAB — COMPREHENSIVE METABOLIC PANEL
ALT: 45 IU/L — ABNORMAL HIGH (ref 0–32)
AST: 25 IU/L (ref 0–40)
Albumin/Globulin Ratio: 1.7 (ref 1.2–2.2)
Albumin: 3.6 g/dL — ABNORMAL LOW (ref 3.8–4.9)
Alkaline Phosphatase: 56 IU/L (ref 44–121)
BUN/Creatinine Ratio: 26 (ref 12–28)
BUN: 11 mg/dL (ref 8–27)
Bilirubin Total: 0.2 mg/dL (ref 0.0–1.2)
CO2: 25 mmol/L (ref 20–29)
Calcium: 8.7 mg/dL (ref 8.7–10.3)
Chloride: 96 mmol/L (ref 96–106)
Creatinine, Ser: 0.43 mg/dL — ABNORMAL LOW (ref 0.57–1.00)
Globulin, Total: 2.1 g/dL (ref 1.5–4.5)
Glucose: 174 mg/dL — ABNORMAL HIGH (ref 70–99)
Potassium: 4.1 mmol/L (ref 3.5–5.2)
Sodium: 138 mmol/L (ref 134–144)
Total Protein: 5.7 g/dL — ABNORMAL LOW (ref 6.0–8.5)
eGFR: 111 mL/min/{1.73_m2} (ref >59)

## 2021-05-03 LAB — LEVETIRACETAM LEVEL: Levetiracetam Lvl: 14.1 ug/mL (ref 10.0–40.0)

## 2021-05-03 LAB — VALPROIC ACID LEVEL: Valproic Acid Lvl: 89 ug/mL (ref 50–100)

## 2021-05-06 ENCOUNTER — Other Ambulatory Visit: Payer: Self-pay

## 2021-05-06 ENCOUNTER — Encounter (HOSPITAL_BASED_OUTPATIENT_CLINIC_OR_DEPARTMENT_OTHER): Payer: PRIVATE HEALTH INSURANCE | Attending: Internal Medicine | Admitting: Internal Medicine

## 2021-05-06 DIAGNOSIS — L89212 Pressure ulcer of right hip, stage 2: Secondary | ICD-10-CM | POA: Insufficient documentation

## 2021-05-06 DIAGNOSIS — R569 Unspecified convulsions: Secondary | ICD-10-CM

## 2021-05-06 DIAGNOSIS — C07 Malignant neoplasm of parotid gland: Secondary | ICD-10-CM | POA: Diagnosis not present

## 2021-05-06 DIAGNOSIS — L89152 Pressure ulcer of sacral region, stage 2: Secondary | ICD-10-CM | POA: Insufficient documentation

## 2021-05-06 NOTE — Progress Notes (Signed)
Melissa Case ([id number]) , Visit Report for 05/06/2021 Abuse/Suicide Risk Screen Details Patient Name: Date of Service: Melissa Case 05/06/2021 7:30 A M Medical Record Number: [id number] Patient Account Number: [id number] Date of Birth/Sex: Treating RN: [birth year] (60 y.o. Ardis Rowan, Lauren Primary Care Raynaldo Falco: Ricky Stabs Other Clinician: Referring Nyssa Sayegh: Treating Abdulaziz Toman/Extender: Bonna Gains, Amy Weeks in Treatment: 0 Abuse/Suicide Risk Screen Items Answer ABUSE RISK SCREEN: Has anyone close to you tried to hurt or harm you recentlyo No Do you feel uncomfortable with anyone in your familyo No Has anyone forced you do things that you didnt want to doo No Electronic Signature(s) Signed: 05/06/2021 11:40:20 AM By: Fonnie Mu RN Entered By: Fonnie Mu on 05/06/2021 07:47:56 -------------------------------------------------------------------------------- Activities of Daily Living Details Patient Name: Date of Service: Melissa Case 05/06/2021 7:30 A M Medical Record Number: [id number] Patient Account Number: [id number] Date of Birth/Sex: Treating RN: 1960/10/18 (60 y.o. Ardis Rowan, Lauren Primary Care Amelie Caracci: Ricky Stabs Other Clinician: Referring Chaunce Winkels: Treating Hannan Hutmacher/Extender: Bonna Gains, Amy Weeks in Treatment: 0 Activities of Daily Living Items Answer Activities of Daily Living (Please select one for each item) Drive Automobile Need Assistance T Medications ake Completely Able Use T elephone Completely Able Care for Appearance Need Assistance Use T oilet Need Assistance Bath / Shower Need Assistance Dress Self Need Assistance Feed Self Completely Able Walk Need Assistance Get In / Out Bed Need Assistance Housework Need Assistance Prepare Meals Need Assistance Handle Money Need Assistance Shop for Self Need Assistance Electronic Signature(s) Signed: 05/06/2021 11:40:20 AM By: Fonnie Mu RN Entered By: Fonnie Mu on 05/06/2021 07:50:59 -------------------------------------------------------------------------------- Education Screening Details Patient Name: Date of Service: Melissa Case 05/06/2021 7:30 A M Medical Record Number: [id number] Patient Account Number: [id number] Date of Birth/Sex: Treating RN: 1961/02/28 (60 y.o. Ardis Rowan, Lauren Primary Care Maheen Cwikla: Ricky Stabs Other Clinician: Referring Zalika Tieszen: Treating Savi Lastinger/Extender: Bonna Gains, Amy Weeks in Treatment: 0 Primary Learner Assessed: Patient Learning Preferences/Education Level/Primary Language Learning Preference: Explanation, Demonstration, Communication Board, Printed Material Highest Education Level: High School Preferred Language: English Cognitive Barrier Language Barrier: No Translator Needed: No Memory Deficit: No Emotional Barrier: No Cultural/Religious Beliefs Affecting Medical Care: No Physical Barrier Impaired Vision: No Impaired Hearing: No Decreased Hand dexterity: No Knowledge/Comprehension Knowledge Level: High Comprehension Level: High Ability to understand written instructions: High Ability to understand verbal instructions: High Motivation Anxiety Level: Calm Cooperation: Cooperative Education Importance: Denies Need Interest in Health Problems: Asks Questions Perception: Coherent Willingness to Engage in Self-Management High Activities: Readiness to Engage in Self-Management High Activities: Electronic Signature(s) Signed: 05/06/2021 11:40:20 AM By: Fonnie Mu RN Entered By: Fonnie Mu on 05/06/2021 07:51:20 -------------------------------------------------------------------------------- Fall Risk Assessment Details Patient Name: Date of Service: Melissa Case 05/06/2021 7:30 A M Medical Record Number: [id number] Patient Account Number: [id number] Date of Birth/Sex: Treating RN: 11/28/1960 (60 y.o.  Ardis Rowan, Lauren Primary Care Jisele Price: Ricky Stabs Other Clinician: Referring Bhavin Monjaraz: Treating Bobette Leyh/Extender: Bonna Gains, Amy Weeks in Treatment: 0 Fall Risk Assessment Items Have you had 2 or more falls in the last 12 monthso 0 Yes Have you had any fall that resulted in injury in the last 12 monthso 0 No FALLS RISK SCREEN History of falling - immediate or within 3 months 0 No Secondary diagnosis (Do you have 2 or more medical diagnoseso) 0 No Ambulatory aid None/bed rest/wheelchair/nurse 0 Yes Crutches/cane/walker 0 No Furniture 0 No Intravenous therapy Access/Saline/Heparin Lock 0 No Gait/Transferring Normal/ bed rest/  wheelchair 0 No Weak (short steps with or without shuffle, stooped but able to lift head while walking, may seek 10 Yes support from furniture) Impaired (short steps with shuffle, may have difficulty arising from chair, head down, impaired 0 No balance) Mental Status Oriented to own ability 0 No Electronic Signature(s) Signed: 05/06/2021 11:40:20 AM By: Fonnie Mu RN Entered By: Fonnie Mu on 05/06/2021 07:50:13 -------------------------------------------------------------------------------- Foot Assessment Details Patient Name: Date of Service: Melissa Case 05/06/2021 7:30 A M Medical Record Number: [id number] Patient Account Number: [id number] Date of Birth/Sex: Treating RN: [birth year] (60 y.o. Ardis Rowan, Lauren Primary Care Erving Sassano: Ricky Stabs Other Clinician: Referring Thaxton Pelley: Treating Rajeev Escue/Extender: Bonna Gains, Amy Weeks in Treatment: 0 Foot Assessment Items Site Locations + = Sensation present, - = Sensation absent, C = Callus, U = Ulcer R = Redness, W = Warmth, M = Maceration, PU = Pre-ulcerative lesion F = Fissure, S = Swelling, D = Dryness Assessment Right: Left: Other Deformity: No No Prior Foot Ulcer: No No Prior Amputation: No No Charcot Joint: No No Ambulatory  Status: Gait: Notes N/A no LE wounds Electronic Signature(s) Signed: 05/06/2021 11:40:20 AM By: Fonnie Mu RN Entered By: Fonnie Mu on 05/06/2021 07:48:53 -------------------------------------------------------------------------------- Nutrition Risk Screening Details Patient Name: Date of Service: Melissa Case 05/06/2021 7:30 A M Medical Record Number: [id number] Patient Account Number: [id number] Date of Birth/Sex: Treating RN: 04-09-1961 (60 y.o. Ardis Rowan, Lauren Primary Care Jearldean Gutt: Ricky Stabs Other Clinician: Referring Ison Wichmann: Treating Talise Sligh/Extender: Bonna Gains, Amy Weeks in Treatment: 0 Height (in): 64 Weight (lbs): 167 Body Mass Index (BMI): 28.7 Nutrition Risk Screening Items Score Screening NUTRITION RISK SCREEN: I have an illness or condition that made me change the kind and/or amount of food I eat 0 No I eat fewer than two meals per day 0 No I eat few fruits and vegetables, or milk products 0 No I have three or more drinks of beer, liquor or wine almost every day 0 No I have tooth or mouth problems that make it hard for me to eat 0 No I don't always have enough money to buy the food I need 0 No I eat alone most of the time 0 No I take three or more different prescribed or over-the-counter drugs a day 0 No Without wanting to, I have lost or gained 10 pounds in the last six months 0 No I am not always physically able to shop, cook and/or feed myself 0 No Nutrition Protocols Good Risk Protocol 0 No interventions needed Moderate Risk Protocol High Risk Proctocol Risk Level: Good Risk Score: 0 Electronic Signature(s) Signed: 05/06/2021 11:40:20 AM By: Fonnie Mu RN Entered By: Fonnie Mu on 05/06/2021 07:48:44

## 2021-05-06 NOTE — Progress Notes (Signed)
[name] ([id number]) , Visit Report for 05/06/2021 Chief Complaint Document Details Patient Name: Date of Service: Melissa Case 05/06/2021 7:30 A M Medical Record Number: [id number] Patient Account Number: [id number] Date of Birth/Sex: Treating RN: Nov 01, 1960 (60 y.o. Elam Dutch Primary Care Provider: Durene Fruits Other Clinician: Referring Provider: Treating Provider/Extender: Wilber Oliphant, Amy Weeks in Treatment: 0 Information Obtained from: Patient Chief Complaint Multiple areas of skin breakdown to the sacrum and right ischium Electronic Signature(s) Signed: 05/06/2021 8:44:23 AM By: Kalman Shan DO Entered By: Kalman Shan on 05/06/2021 08:36:47 -------------------------------------------------------------------------------- HPI Details Patient Name: Date of Service: Melissa Case 05/06/2021 7:30 A M Medical Record Number: [id number] Patient Account Number: [id number] Date of Birth/Sex: Treating RN: 12/15/1960 (60 y.o. 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 She states that she has had debilitating seizures that have caused her to be less mobile. She uses a walker for ambulation. She states she mostly sits all day. She has been using mupirocin cream that was prescribed by her primary care provider. She reports mild tenderness to the areas. She denies signs of infection. Electronic Signature(s) Signed: 05/06/2021 8:44:23 AM By: Kalman Shan DO Entered By: Kalman Shan on  05/06/2021 08:39:24 -------------------------------------------------------------------------------- Physical Exam Details Patient Name: Date of Service: Melissa Case 05/06/2021 7:30 A M Medical Record Number: [id number] Patient Account Number: [id number] Date of Birth/Sex: Treating RN: [birth year] (60 y.o. Elam Dutch Primary Care Provider: Durene Fruits Other Clinician: Referring Provider: Treating Provider/Extender: Wilber Oliphant, Amy Weeks in Treatment: 0 Constitutional temperature within target range for patient.Marland Kitchen Psychiatric pleasant and cooperative. Notes T the sacrum and right ischium there are multiple areas of skin breakdown. Mostly granulation tissue but with scant nonviable tissue present. No signs of o infection. Electronic Signature(s) Signed: 05/06/2021 8:44:23 AM By: Kalman Shan DO Entered By: Kalman Shan on 05/06/2021 08:40:14 -------------------------------------------------------------------------------- Physician Orders Details Patient Name: Date of Service: Melissa Case 05/06/2021 7:30 A M Medical Record Number: [id number] Patient Account Number: [id number] Date of Birth/Sex: Treating RN: May 21, 1961 (60 y.o. Tonita Phoenix, Lauren Primary Care Provider: Durene Fruits Other Clinician: Referring Provider: Treating Provider/Extender: Wilber Oliphant, Amy Weeks in Treatment: 0 Verbal / Phone Orders: No Diagnosis Coding Follow-up Appointments Return Appointment in 2 weeks. Bathing/ Shower/ Hygiene May shower with protection but do not get wound dressing(s) wet. Off-Loading Turn and reposition every 2 hours Wound Treatment Wound #1 - Sacrum Cleanser: Soap and Water 1 x Per Day/15 Days Discharge Instructions: May shower and wash wound with dial antibacterial soap and water prior to dressing change. Cleanser: Wound Cleanser (DME) (Generic) 1 x Per Day/15 Days Discharge Instructions: Cleanse the wound with wound  cleanser prior to applying a clean dressing using gauze sponges, not tissue or cotton balls. Peri-Wound Care: Zinc Oxide Ointment 30g tube 1 x Per Day/15 Days Discharge Instructions: Apply Zinc Oxide to periwound and wound with each dressing change Secondary Dressing: Woven Gauze Sponge, Non-Sterile 4x4 in (DME) (Generic) 1 x Per Day/15 Days Discharge Instructions: Apply over primary dressing as directed. Secondary Dressing: ABD Pad, 5x9 (DME) (Generic)  1 x Per Day/15 Days Discharge Instructions: Apply over primary dressing as directed. Wound #2 - Ischium Wound Laterality: Right Cleanser: Soap and Water 1 x Per MGN/00 Days Discharge Instructions: May shower and wash wound with dial antibacterial soap and water prior to dressing change. Cleanser: Wound Cleanser (DME) (Generic) 1 x Per Day/15 Days Discharge Instructions: Cleanse the wound with wound cleanser prior to applying a clean dressing using gauze sponges, not tissue or cotton balls. Peri-Wound Care: Zinc Oxide Ointment 30g tube 1 x Per Day/15 Days Discharge Instructions: Apply Zinc Oxide to periwound and wound with each dressing change Secondary Dressing: Woven Gauze Sponge, Non-Sterile 4x4 in (DME) (Generic) 1 x Per Day/15 Days Discharge Instructions: Apply over primary dressing as directed. Secondary Dressing: ABD Pad, 5x9 (DME) (Generic) 1 x Per Day/15 Days Discharge Instructions: Apply over primary dressing as directed. Electronic Signature(s) Signed: 05/06/2021 8:44:23 AM By: Kalman Shan DO Entered By: Kalman Shan on 05/06/2021 08:40:31 -------------------------------------------------------------------------------- Problem List Details Patient Name: Date of Service: Melissa Case 05/06/2021 7:30 A M Medical Record Number: [id number] Patient Account Number: [id number] Date of Birth/Sex: Treating RN: 05-Feb-1961 (60 y.o. Elam Dutch Primary Care Provider: Durene Fruits Other Clinician: Referring  Provider: Treating Provider/Extender: Wilber Oliphant, Amy Weeks in Treatment: 0 Active Problems ICD-10 Encounter Code Description Active Date MDM Diagnosis L89.152 Pressure ulcer of sacral region, stage 2 05/06/2021 No Yes L89.212 Pressure ulcer of right hip, stage 2 05/06/2021 No Yes R56.9 Unspecified convulsions 05/06/2021 No Yes C07 Malignant neoplasm of parotid gland 05/06/2021 No Yes Inactive Problems Resolved Problems Electronic Signature(s) Signed: 05/06/2021 8:44:23 AM By: Kalman Shan DO Entered By: Kalman Shan on 05/06/2021 08:35:59 -------------------------------------------------------------------------------- Progress Note Details Patient Name: Date of Service: Melissa Case 05/06/2021 7:30 A M Medical Record Number: [id number] Patient Account Number: [id number] Date of Birth/Sex: Treating RN: 1961/01/03 (60 y.o. 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 She states that she has had debilitating seizures that have caused her to be less mobile. She uses a walker for ambulation. She states she mostly sits all day. She has been using mupirocin cream that was prescribed by her primary care provider. She reports mild tenderness to the areas. She denies signs of infection. Patient History Information obtained from Patient, Chart. Allergies morphine, Penicillins Family  History Unknown History. Social History Current every day smoker - VAPORIZER, Marital Status - Married, Alcohol Use - Never, Drug Use - No History, Caffeine Use - Never. Medical History Eyes Denies history of Cataracts, Glaucoma, Optic Neuritis Ear/Nose/Mouth/Throat Denies history of Chronic sinus problems/congestion, Middle ear problems Hematologic/Lymphatic Denies history of Anemia, Hemophilia, Human Immunodeficiency Virus, Lymphedema, Sickle Cell Disease Respiratory Denies history of Aspiration, Asthma, Chronic Obstructive Pulmonary Disease (COPD), Pneumothorax, Sleep Apnea, Tuberculosis Cardiovascular Denies history of Angina, Arrhythmia, Congestive Heart Failure, Coronary Artery Disease, Deep Vein Thrombosis, Hypertension, Hypotension, Myocardial Infarction, Peripheral Arterial Disease, Peripheral Venous Disease, Phlebitis, Vasculitis Gastrointestinal Denies history of Cirrhosis , Colitis, Crohnoos, Hepatitis A, Hepatitis B, Hepatitis C Genitourinary Denies history of  End Stage Renal Disease Immunological Denies history of Lupus Erythematosus, Raynaudoos, Scleroderma Integumentary (Skin) Denies history of History of Burn Musculoskeletal Denies history of Gout, Rheumatoid Arthritis, Osteoarthritis, Osteomyelitis Neurologic Patient has history of Seizure Disorder Denies history of Dementia, Neuropathy, Quadriplegia, Paraplegia Oncologic Denies history of Received Chemotherapy, Received Radiation Psychiatric Denies history of Anorexia/bulimia, Confinement Anxiety Hospitalization/Surgery History - ABDOMINAL HYSTERECTOMY. - CHOLECYSTECTOMY. - MULTIPLE ETRACTIONS WITH ALVEOPLASTY. - PARATIDECTOMY. Medical A Surgical History Notes nd Gastrointestinal GERD Review of Systems (ROS) Constitutional Symptoms (General Health) Denies complaints or symptoms of Fatigue, Fever, Chills, Marked Weight Change. Eyes Complains or has symptoms of Glasses / Contacts. Denies complaints or  symptoms of Dry Eyes, Vision Changes. Ear/Nose/Mouth/Throat Denies complaints or symptoms of Chronic sinus problems or rhinitis, BARRETT ESOPHAGUS Respiratory Denies complaints or symptoms of Chronic or frequent coughs, Shortness of Breath. Cardiovascular Denies complaints or symptoms of Chest pain. Gastrointestinal Denies complaints or symptoms of Frequent diarrhea, Nausea, Vomiting. Endocrine Denies complaints or symptoms of Heat/cold intolerance. Genitourinary Denies complaints or symptoms of Frequent urination, HYPERTONICITY OF BLADDER Integumentary (Skin) Complains or has symptoms of Wounds. Musculoskeletal Denies complaints or symptoms of Muscle Pain, Muscle Weakness. Neurologic Complains or has symptoms of Numbness/parasthesias, FACIAL PARALYSIS, CEREBRAL EDEMA, SEIZURES, Oncologic CANCER OF PAROTID GLAND Psychiatric Denies complaints or symptoms of Claustrophobia, Suicidal, DEPRESSION and PANIC ATTACKS Objective Constitutional temperature within target range for patient.. Vitals Time Taken: 7:45 AM, Height: 64 in, Source: Stated, Weight: 167 lbs, Source: Stated, BMI: 28.7, Temperature: 98.0 F, Pulse: 108 bpm, Respiratory Rate: 17 breaths/min, Blood Pressure: 141/93 mmHg. Psychiatric pleasant and cooperative. General Notes: T the sacrum and right ischium there are multiple areas of skin breakdown. Mostly granulation tissue but with scant nonviable tissue present. o No signs of infection. Integumentary (Hair, Skin) Wound #1 status is Open. Original cause of wound was Pressure Injury. The date acquired was: 04/21/2021. The wound is located on the Sacrum. The wound measures 10cm length x 10.5cm width x 0.1cm depth; 82.467cm^2 area and 8.247cm^3 volume. There is no tunneling or undermining noted. There is a medium amount of serosanguineous drainage noted. The wound margin is distinct with the outline attached to the wound base. There is large (67-100%) red, pink granulation  within the wound bed. There is a small (1-33%) amount of necrotic tissue within the wound bed including Adherent Slough. Wound #2 status is Open. Original cause of wound was Pressure Injury. The date acquired was: 04/21/2021. The wound is located on the Right Ischium. The wound measures 0.7cm length x 0.5cm width x 0.1cm depth; 0.275cm^2 area and 0.027cm^3 volume. There is no tunneling or undermining noted. There is a medium amount of serosanguineous drainage noted. The wound margin is distinct with the outline attached to the wound base. There is large (67-100%) red, pink granulation within the wound bed. There is a small (1-33%) amount of necrotic tissue within the wound bed including Adherent Slough. Assessment Active Problems ICD-10 Pressure ulcer of sacral region, stage 2 Pressure ulcer of right hip, stage 2 Unspecified convulsions Malignant neoplasm of parotid gland Patient presents with a 1 month history of skin breakdown to her sacrum and right ischium. These areas appear superficial and related to the fact that she is not mobile. I recommended aggressive offloading and zinc oxide daily to the area. There are no signs of infection on exam. Follow-up in 2 weeks Plan Follow-up Appointments: Return Appointment in 2 weeks. Bathing/ Shower/ Hygiene: May shower with protection but do not get wound dressing(s) wet. Off-Loading: Turn and  reposition every 2 hours WOUND #1: - Sacrum Wound Laterality: Cleanser: Soap and Water 1 x Per XEN/40 Days Discharge Instructions: May shower and wash wound with dial antibacterial soap and water prior to dressing change. Cleanser: Wound Cleanser (DME) (Generic) 1 x Per Day/15 Days Discharge Instructions: Cleanse the wound with wound cleanser prior to applying a clean dressing using gauze sponges, not tissue or cotton balls. Peri-Wound Care: Zinc Oxide Ointment 30g tube 1 x Per Day/15 Days Discharge Instructions: Apply Zinc Oxide to periwound and wound with  each dressing change Secondary Dressing: Woven Gauze Sponge, Non-Sterile 4x4 in (DME) (Generic) 1 x Per Day/15 Days Discharge Instructions: Apply over primary dressing as directed. Secondary Dressing: ABD Pad, 5x9 (DME) (Generic) 1 x Per Day/15 Days Discharge Instructions: Apply over primary dressing as directed. WOUND #2: - Ischium Wound Laterality: Right Cleanser: Soap and Water 1 x Per HWK/08 Days Discharge Instructions: May shower and wash wound with dial antibacterial soap and water prior to dressing change. Cleanser: Wound Cleanser (DME) (Generic) 1 x Per Day/15 Days Discharge Instructions: Cleanse the wound with wound cleanser prior to applying a clean dressing using gauze sponges, not tissue or cotton balls. Peri-Wound Care: Zinc Oxide Ointment 30g tube 1 x Per Day/15 Days Discharge Instructions: Apply Zinc Oxide to periwound and wound with each dressing change Secondary Dressing: Woven Gauze Sponge, Non-Sterile 4x4 in (DME) (Generic) 1 x Per Day/15 Days Discharge Instructions: Apply over primary dressing as directed. Secondary Dressing: ABD Pad, 5x9 (DME) (Generic) 1 x Per Day/15 Days Discharge Instructions: Apply over primary dressing as directed. 1. Zinc oxide daily 2. Aggressive offloading 3. Follow-up in 2 weeks Electronic Signature(s) Signed: 05/06/2021 8:44:23 AM By: Kalman Shan DO Entered By: Kalman Shan on 05/06/2021 08:42:03 -------------------------------------------------------------------------------- HxROS Details Patient Name: Date of Service: Melissa Case 05/06/2021 7:30 A M Medical Record Number: [id number] Patient Account Number: [id number] Date of Birth/Sex: Treating RN: 1960/11/10 (60 y.o. Tonita Phoenix, Lauren Primary Care Provider: Durene Fruits Other Clinician: Referring Provider: Treating Provider/Extender: Wilber Oliphant, Amy Weeks in Treatment: 0 Information Obtained From Patient Chart Constitutional Symptoms (General  Health) Complaints and Symptoms: Negative for: Fatigue; Fever; Chills; Marked Weight Change Eyes Complaints and Symptoms: Positive for: Glasses / Contacts Negative for: Dry Eyes; Vision Changes Medical History: Negative for: Cataracts; Glaucoma; Optic Neuritis Ear/Nose/Mouth/Throat Complaints and Symptoms: Negative for: Chronic sinus problems or rhinitis Review of System Notes: BARRETT ESOPHAGUS Medical History: Negative for: Chronic sinus problems/congestion; Middle ear problems Respiratory Complaints and Symptoms: Negative for: Chronic or frequent coughs; Shortness of Breath Medical History: Negative for: Aspiration; Asthma; Chronic Obstructive Pulmonary Disease (COPD); Pneumothorax; Sleep Apnea; Tuberculosis Cardiovascular Complaints and Symptoms: Negative for: Chest pain Medical History: Negative for: Angina; Arrhythmia; Congestive Heart Failure; Coronary Artery Disease; Deep Vein Thrombosis; Hypertension; Hypotension; Myocardial Infarction; Peripheral Arterial Disease; Peripheral Venous Disease; Phlebitis; Vasculitis Gastrointestinal Complaints and Symptoms: Negative for: Frequent diarrhea; Nausea; Vomiting Medical History: Negative for: Cirrhosis ; Colitis; Crohns; Hepatitis A; Hepatitis B; Hepatitis C Past Medical History Notes: GERD Endocrine Complaints and Symptoms: Negative for: Heat/cold intolerance Genitourinary Complaints and Symptoms: Negative for: Frequent urination Review of System Notes: HYPERTONICITY OF BLADDER Medical History: Negative for: End Stage Renal Disease Integumentary (Skin) Complaints and Symptoms: Positive for: Wounds Medical History: Negative for: History of Burn Musculoskeletal Complaints and Symptoms: Negative for: Muscle Pain; Muscle Weakness Medical History: Negative for: Gout; Rheumatoid Arthritis; Osteoarthritis; Osteomyelitis Neurologic Complaints and Symptoms: Positive for: Numbness/parasthesias Review of System  Notes: FACIAL PARALYSIS, CEREBRAL EDEMA, SEIZURES, Medical  History: Positive for: Seizure Disorder Negative for: Dementia; Neuropathy; Quadriplegia; Paraplegia Psychiatric Complaints and Symptoms: Negative for: Claustrophobia; Suicidal Review of System Notes: DEPRESSION and PANIC ATTACKS Medical History: Negative for: Anorexia/bulimia; Confinement Anxiety Hematologic/Lymphatic Medical History: Negative for: Anemia; Hemophilia; Human Immunodeficiency Virus; Lymphedema; Sickle Cell Disease Immunological Medical History: Negative for: Lupus Erythematosus; Raynauds; Scleroderma Oncologic Complaints and Symptoms: Review of System Notes: CANCER OF PAROTID GLAND Medical History: Negative for: Received Chemotherapy; Received Radiation Immunizations Implantable Devices None Hospitalization / Surgery History Type of Hospitalization/Surgery ABDOMINAL HYSTERECTOMY CHOLECYSTECTOMY MULTIPLE ETRACTIONS WITH ALVEOPLASTY PARATIDECTOMY Family and Social History Unknown History: Yes; Current every day smoker - VAPORIZER; Marital Status - Married; Alcohol Use: Never; Drug Use: No History; Caffeine Use: Never; Financial Concerns: No; Food, Clothing or Shelter Needs: No; Support System Lacking: No; Transportation Concerns: No Electronic Signature(s) Signed: 05/06/2021 8:44:23 AM By: Kalman Shan DO Signed: 05/06/2021 11:40:20 AM By: Rhae Hammock RN Entered By: Rhae Hammock on 05/06/2021 07:59:21 -------------------------------------------------------------------------------- SuperBill Details Patient Name: Date of Service: Melissa Case 05/06/2021 Medical Record Number: [id number] Patient Account Number: [id number] Date of Birth/Sex: Treating RN: 1960/10/16 (60 y.o. Elam Dutch Primary Care Provider: Durene Fruits Other Clinician: Referring Provider: Treating Provider/Extender: Wilber Oliphant, Amy Weeks in Treatment: 0 Diagnosis Coding ICD-10  Codes Code Description [phone] Pressure ulcer of sacral region, stage 2 L89.212 Pressure ulcer of right hip, stage 2 R56.9 Unspecified convulsions C07 Malignant neoplasm of parotid gland Facility Procedures CPT4 Code: [id number] Description: [phone] - WOUND CARE VISIT-LEV 5 NEW PT Modifier: Quantity: 1 Physician Procedures : CPT4 Code Description Modifier [id number] WC PHYS LEVEL 3 NEW PT ICD-10 Diagnosis Description L89.152 Pressure ulcer of sacral region, stage 2 L89.212 Pressure ulcer of right hip, stage 2 R56.9 Unspecified convulsions C07 Malignant neoplasm of parotid  gland Quantity: 1 Electronic Signature(s) Signed: 05/06/2021 8:56:01 AM By: Kalman Shan DO Signed: 05/06/2021 11:40:20 AM By: Rhae Hammock RN Previous Signature: 05/06/2021 8:44:23 AM Version By: Kalman Shan DO Entered By: Rhae Hammock on 05/06/2021 08:47:20

## 2021-05-09 ENCOUNTER — Other Ambulatory Visit: Payer: Self-pay | Admitting: Neurology

## 2021-05-09 ENCOUNTER — Encounter: Payer: Self-pay | Admitting: Neurology

## 2021-05-09 ENCOUNTER — Telehealth: Payer: Self-pay | Admitting: Neurology

## 2021-05-09 DIAGNOSIS — R569 Unspecified convulsions: Secondary | ICD-10-CM

## 2021-05-09 NOTE — Telephone Encounter (Signed)
Pt's husband, Florina Glas (on [place]) called surgeon, Dr. Reatha Armour have decreased dexamethasone (DECADRON) 4 MG tablet to Two times a day for the next 2 months.  Also Dr. April Manson  put in an order for PT have not heard from anyone. Would like a call from the nurse.

## 2021-05-09 NOTE — Procedures (Signed)
° °  History: 64 yof with seizures   This RVEEG was performed using equipment provided by [employer] utilizing Bluetooth ( Trackit ) amplifiers with EEGT attended video collected and performed with a 19-channel digital VEEG, Data was acquired with a minimum of 21 bipolar connections and sampled at a minimum rate of 250 cycles per second per channel and one channel for EKG, obtained according to 10-20 international electrode placement system, reformatted digitally into referential and bipolar montages, reviewed and verified for accuracy and validity by the governing reading neurologist in full details.   Activating Procedure:  Photic Stimulation was Performed. Photic Driving Response seen. Hyperventilation was not performed due to pt contraindication of Droplet Precaution-COVID-19.   No clear epileptiform activity was detected by the reviewing neurodiagnostic technologist for further evaluation.   Intermittent artifacts could not be resolved. All impedance under 10K ohms.   Pt is awake, alert, cooperative and orientated Xs 4 of 4? correct.   EEGT Attended Video Recording: Yes   Hardware - (Trackit) by [employer] Description:  This RVEEG was obtained during awake and drowsy states, during wakefulness posterior dominant rhythm was 8 Hz, alpha, well-modulated, moderate in voltage, and attenuated appropriately with eye opening. During the recording there was no focal or lateralized epileptiform activity or focal slowing noted. Drowsiness was marked by stage I sleep. Hyperventilation was not performed due to pt contraindication of Droplet Precaution. Photic stimulation resulted in posterior driving responses at multiple frequencies, without any abnormal photo paroxysmal responses. EKG normal sinus rhythm.    RVEEG Technical Summary of Findings Impression and Interpretations: This is a normal routine video EEG. However a normal EEG does not exclude a diagnosis of  epilepsy.    Alric Ran, MD [facility]

## 2021-05-09 NOTE — Telephone Encounter (Signed)
Referral was sent to neuro rehab next door today. They will reach out to her to schedule.

## 2021-05-09 NOTE — Procedures (Signed)
° ° °  History: 67 yof with seizure   TECHNICAL DESCRIPTION: This AVEEG was performed using equipment provided by [employer] utilizing Bluetooth (Track[employer] ) amplifiers with continuous EEGT attended video collection using encrypted remote transmission via [place] secured cellular tower network with data rates for each AVEEG performed. This is a Biomedical engineer AVEEG, obtained, according to the 10-20 international electrode placement system, reformatted digitally into referential and bipolar montages. Data was acquired with a minimum of 21 bipolar connections and sampled at a minimum rate of 250 cycles per second per channel, maximum rate of 450 cycles per second per channel and two channels for EKG. The entire AVEEG study was recorded through cable and or radio telemetry for subsequent analysis. Specified epochs of the AVEEG data were identified at the direction of the subject by the depression of a push button by the patient. Each patient's event file included data acquired two minutes prior to the push button activation and continuing until two minutes afterwards. AVEEG files were reviewed on [place] by [employer] provided [employer] with a digital high frequency filter set at 70 Hz and a low frequency filter set at 0.1 Hz with a paper speed of 66mm/s resulting in 10 seconds per digital page. This entire AVEEG was reviewed by the EEG Technologist. Random time samples, random sleep samples, clips, patient initiated push button files with included patient daily diary logs, EEG Technologist bookmarked note data was reviewed and verified for accuracy and validity by the governing reading neurologist in full details. This AEEGV was fully compliant with all requirements for CPT 97500 for setup, patient education, take down and administered by an EEG technologist.   INTERMITTENT MONITORING WITH VIDEO TECHNICAL DESCRIPTION:  This Long-Term AVEEG was  monitored intermittently by a qualified EEG technologist for the entirety of the recording; quality check-ins were performed at a minimum of every two hours, checking and documenting real-time data and video to assure the integrity and quality of the recording (e.g., camera position, electrode integrity and impedance), and identify the need for maintenance. For intermittent monitoring, an EEG Technologist monitored no more than 12 patients concurrently. Diagnostic video was captured at least 80% of the time during the recording.   TECHNOLOGIST EVENTS: No clear epileptiform activity was detected by the reviewing neurodiagnostic technologist during the recording for further evaluation.  PATIENT EVENTS:  The patient pushed the event button 0 times during the AVEEG recording for further evaluation. However, EEGT test press #1 time with pt at the beginning of the tracing.  TIME SAMPLES: 1 minute of every hour recorded are reviewed as random time samples.   SLEEP SAMPLES: 5 minutes of every 24 hour recorded sleep cycle are reviewed as random sleep samples.   BACKGROUND EEG: This AVEEG consists of well-modulated bilateral synchronous and symmetrical background in the alpha frequencies in the awake state.  AWAKE: The posterior dominant rhythm was characterized by symmetric and reactive 8-8.5 Hz activity with eyes closed.   SLEEP: Stage two sleep displayed Sleep Spindles and Vertex Waveform components. All other sleep stages appeared symmetrical and synchronous with regards to K-Complexes, and REM.  EKG: Normal sinus rhythm.    AVEEG Technical Summary of Findings Impression and Interpretations: This is a normal ambulatory video EEG. There were no events recorded. A normal EEG does not exclude a diagnosis of epilepsy.     Alric Ran, MD [facility]

## 2021-05-09 NOTE — Progress Notes (Signed)
Melissa Case ([id number]) , Visit Report for 05/06/2021 Allergy List Details Patient Name: Date of Service: Melissa Case 05/06/2021 7:30 A M Medical Record Number: [id number] Patient Account Number: [id number] Date of Birth/Sex: Treating RN: [birth year] (60 y.o. Tonita Phoenix, Lauren Primary Care Ylonda Storr: Durene Fruits Other Clinician: Referring Nusrat Encarnacion: Treating Antoinne Spadaccini/Extender: Wilber Oliphant, Amy Weeks in Treatment: 0 Allergies Active Allergies morphine Penicillins Allergy Notes Electronic Signature(s) Signed: 05/06/2021 11:40:20 AM By: Rhae Hammock RN Entered By: Rhae Hammock on 05/06/2021 07:47:42 -------------------------------------------------------------------------------- Arrival Information Details Patient Name: Date of Service: Melissa Case 05/06/2021 7:30 A M Medical Record Number: [id number] Patient Account Number: [id number] Date of Birth/Sex: Treating RN: 11-18-1960 (60 y.o. Tonita Phoenix, Lauren Primary Care Tanyika Barros: Durene Fruits Other Clinician: Referring Calloway Andrus: Treating Suhaas Agena/Extender: Wilber Oliphant, Amy Weeks in Treatment: 0 Visit Information Patient Arrived: Wheel Chair Arrival Time: 07:45 Accompanied By: husband Transfer Assistance: Manual Patient Identification Verified: Yes Secondary Verification Process Completed: Yes Patient Requires Transmission-Based Precautions: No Patient Has Alerts: No Electronic Signature(s) Signed: 05/06/2021 11:40:20 AM By: Rhae Hammock RN Entered By: Rhae Hammock on 05/06/2021 07:45:56 -------------------------------------------------------------------------------- Clinic Level of Care Assessment Details Patient Name: Date of Service: Melissa Case 05/06/2021 7:30 A M Medical Record Number: [id number] Patient Account Number: [id number] Date of Birth/Sex: Treating RN: March 03, 1961 (60 y.o. Tonita Phoenix, Lauren Primary Care Tambria Pfannenstiel: Durene Fruits Other  Clinician: Referring Janitza Revuelta: Treating Madysun Thall/Extender: Wilber Oliphant, Amy Weeks in Treatment: 0 Clinic Level of Care Assessment Items TOOL 2 Quantity Score X- 1 0 Use when only an EandM is performed on the INITIAL visit ASSESSMENTS - Nursing Assessment / Reassessment X- 1 20 General Physical Exam (combine w/ comprehensive assessment (listed just below) when performed on new pt. evals) X- 1 25 Comprehensive Assessment (HX, ROS, Risk Assessments, Wounds Hx, etc.) ASSESSMENTS - Wound and Skin A ssessment / Reassessment []  - 0 Simple Wound Assessment / Reassessment - one wound X- 10 5 Complex Wound Assessment / Reassessment - multiple wounds []  - 0 Dermatologic / Skin Assessment (not related to wound area) ASSESSMENTS - Ostomy and/or Continence Assessment and Care []  - 0 Incontinence Assessment and Management []  - 0 Ostomy Care Assessment and Management (repouching, etc.) PROCESS - Coordination of Care []  - 0 Simple Patient / Family Education for ongoing care X- 1 20 Complex (extensive) Patient / Family Education for ongoing care X- 1 10 Staff obtains Programmer, systems, Records, T Results / Process Orders est []  - 0 Staff telephones HHA, Nursing Homes / Clarify orders / etc []  - 0 Routine Transfer to another Facility (non-emergent condition) []  - 0 Routine Hospital Admission (non-emergent condition) X- 1 15 New Admissions / Biomedical engineer / Ordering NPWT Apligraf, etc. , []  - 0 Emergency Hospital Admission (emergent condition) X- 1 10 Simple Discharge Coordination []  - 0 Complex (extensive) Discharge Coordination PROCESS - Special Needs []  - 0 Pediatric / Minor Patient Management []  - 0 Isolation Patient Management []  - 0 Hearing / Language / Visual special needs []  - 0 Assessment of Community assistance (transportation, D/C planning, etc.) []  - 0 Additional assistance / Altered mentation []  - 0 Support Surface(s) Assessment (bed, cushion,  seat, etc.) INTERVENTIONS - Wound Cleansing / Measurement X- 1 5 Wound Imaging (photographs - any number of wounds) []  - 0 Wound Tracing (instead of photographs) []  - 0 Simple Wound Measurement - one wound X- 10 5 Complex Wound Measurement - multiple wounds []  - 0 Simple Wound Cleansing - one wound X-  Wound Description Classification: Category/Stage II Wound  Margin: Distinct, outline attached Exudate Amount: Medium Exudate Type: Serosanguineous Exudate Color: red, brown Foul Odor After Cleansing: No Slough/Fibrino Yes Wound Bed Granulation Amount: Large (67-100%) Exposed Structure Granulation Quality: Red, Pink Fascia Exposed: No Necrotic Amount: Small (1-33%) Fat Layer (Subcutaneous Tissue) Exposed: No Necrotic Quality: Adherent Slough Tendon Exposed: No Muscle Exposed: No Joint Exposed: No Bone Exposed: No Treatment Notes Wound #1 (Sacrum) Cleanser Soap and Water Discharge Instruction: May shower and wash wound with dial antibacterial soap and water prior to dressing change. Wound Cleanser Discharge Instruction: Cleanse the wound with wound cleanser prior to applying a clean dressing using gauze sponges, not tissue or cotton balls. Peri-Wound Care Zinc Oxide Ointment 30g tube Discharge Instruction: Apply Zinc Oxide to periwound and wound with each dressing change Topical Primary Dressing Secondary Dressing Woven Gauze Sponge, Non-Sterile 4x4 in Discharge Instruction: Apply over primary dressing as directed. ABD Pad, 5x9 Discharge Instruction: Apply over primary dressing as directed. Secured With Compression Wrap Compression Stockings Environmental education officer) Signed: 05/06/2021 11:40:20 AM By: Rhae Hammock RN Entered By: Rhae Hammock on 05/06/2021 08:09:29 -------------------------------------------------------------------------------- Wound Assessment Details Patient Name: Date of Service: Melissa Case 05/06/2021 7:30 A M Medical Record Number: [id number] Patient Account Number: [id number] Date of Birth/Sex: Treating RN: 1960-06-16 (60 y.o. Tonita Phoenix, Lauren Primary Care Ceejay Kegley: Durene Fruits Other Clinician: Referring Ahtziri Jeffries: Treating Sehaj Mcenroe/Extender: Wilber Oliphant, Amy Weeks in Treatment: 0 Wound Status Wound Number: 2 Primary Etiology: Pressure Ulcer Wound Location:  Right Ischium Wound Status: Open Wounding Event: Pressure Injury Comorbid History: Seizure Disorder Date Acquired: 04/21/2021 Weeks Of Treatment: 0 Clustered Wound: No Photos Photo Uploaded By: Donavan Burnet on 05/06/2021 14:11:53 Wound Measurements Length: (cm) 0.7 Width: (cm) 0.5 Depth: (cm) 0.1 Area: (cm) 0.275 Volume: (cm) 0.027 % Reduction in Area: % Reduction in Volume: Epithelialization: None Tunneling: No Undermining: No Wound Description Classification: Category/Stage II Wound Margin: Distinct, outline attached Exudate Amount: Medium Exudate Type: Serosanguineous Exudate Color: red, brown Foul Odor After Cleansing: No Slough/Fibrino Yes Wound Bed Granulation Amount: Large (67-100%) Exposed Structure Granulation Quality: Red, Pink Fascia Exposed: No Necrotic Amount: Small (1-33%) Fat Layer (Subcutaneous Tissue) Exposed: No Necrotic Quality: Adherent Slough Tendon Exposed: No Muscle Exposed: No Joint Exposed: No Bone Exposed: No Treatment Notes Wound #2 (Ischium) Wound Laterality: Right Cleanser Soap and Water Discharge Instruction: May shower and wash wound with dial antibacterial soap and water prior to dressing change. Wound Cleanser Discharge Instruction: Cleanse the wound with wound cleanser prior to applying a clean dressing using gauze sponges, not tissue or cotton balls. Peri-Wound Care Zinc Oxide Ointment 30g tube Discharge Instruction: Apply Zinc Oxide to periwound and wound with each dressing change Topical Primary Dressing Secondary Dressing Woven Gauze Sponge, Non-Sterile 4x4 in Discharge Instruction: Apply over primary dressing as directed. ABD Pad, 5x9 Discharge Instruction: Apply over primary dressing as directed. Secured With Compression Wrap Compression Stockings Environmental education officer) Signed: 05/06/2021 11:40:20 AM By: Rhae Hammock RN Entered By: Rhae Hammock on 05/06/2021  08:10:42 -------------------------------------------------------------------------------- Vitals Details Patient Name: Date of Service: Bettey Mare NICA 05/06/2021 7:30 A M Medical Record Number: [id number] Patient Account Number: [id number] Date of Birth/Sex: Treating RN: Apr 30, 1961 (60 y.o. Tonita Phoenix, Lauren Primary Care Aysel Gilchrest: Durene Fruits Other Clinician: Referring Easton Sivertson: Treating Sherron Mapp/Extender: Wilber Oliphant, Amy Weeks in Treatment: 0 Vital Signs Time Taken: 07:45 Temperature (F): 98.0 Height (in): 64 Pulse (bpm): 108 Source: Stated Respiratory Rate (breaths/min): 17 Weight (lbs): 167 Blood Pressure (  Melissa Case ([id number]) , Visit Report for 05/06/2021 Allergy List Details Patient Name: Date of Service: Melissa Case 05/06/2021 7:30 A M Medical Record Number: [id number] Patient Account Number: [id number] Date of Birth/Sex: Treating RN: [birth year] (60 y.o. Tonita Phoenix, Lauren Primary Care Ylonda Storr: Durene Fruits Other Clinician: Referring Nusrat Encarnacion: Treating Antoinne Spadaccini/Extender: Wilber Oliphant, Amy Weeks in Treatment: 0 Allergies Active Allergies morphine Penicillins Allergy Notes Electronic Signature(s) Signed: 05/06/2021 11:40:20 AM By: Rhae Hammock RN Entered By: Rhae Hammock on 05/06/2021 07:47:42 -------------------------------------------------------------------------------- Arrival Information Details Patient Name: Date of Service: Melissa Case 05/06/2021 7:30 A M Medical Record Number: [id number] Patient Account Number: [id number] Date of Birth/Sex: Treating RN: 11-18-1960 (60 y.o. Tonita Phoenix, Lauren Primary Care Tanyika Barros: Durene Fruits Other Clinician: Referring Calloway Andrus: Treating Suhaas Agena/Extender: Wilber Oliphant, Amy Weeks in Treatment: 0 Visit Information Patient Arrived: Wheel Chair Arrival Time: 07:45 Accompanied By: husband Transfer Assistance: Manual Patient Identification Verified: Yes Secondary Verification Process Completed: Yes Patient Requires Transmission-Based Precautions: No Patient Has Alerts: No Electronic Signature(s) Signed: 05/06/2021 11:40:20 AM By: Rhae Hammock RN Entered By: Rhae Hammock on 05/06/2021 07:45:56 -------------------------------------------------------------------------------- Clinic Level of Care Assessment Details Patient Name: Date of Service: Melissa Case 05/06/2021 7:30 A M Medical Record Number: [id number] Patient Account Number: [id number] Date of Birth/Sex: Treating RN: March 03, 1961 (60 y.o. Tonita Phoenix, Lauren Primary Care Tambria Pfannenstiel: Durene Fruits Other  Clinician: Referring Janitza Revuelta: Treating Madysun Thall/Extender: Wilber Oliphant, Amy Weeks in Treatment: 0 Clinic Level of Care Assessment Items TOOL 2 Quantity Score X- 1 0 Use when only an EandM is performed on the INITIAL visit ASSESSMENTS - Nursing Assessment / Reassessment X- 1 20 General Physical Exam (combine w/ comprehensive assessment (listed just below) when performed on new pt. evals) X- 1 25 Comprehensive Assessment (HX, ROS, Risk Assessments, Wounds Hx, etc.) ASSESSMENTS - Wound and Skin A ssessment / Reassessment []  - 0 Simple Wound Assessment / Reassessment - one wound X- 10 5 Complex Wound Assessment / Reassessment - multiple wounds []  - 0 Dermatologic / Skin Assessment (not related to wound area) ASSESSMENTS - Ostomy and/or Continence Assessment and Care []  - 0 Incontinence Assessment and Management []  - 0 Ostomy Care Assessment and Management (repouching, etc.) PROCESS - Coordination of Care []  - 0 Simple Patient / Family Education for ongoing care X- 1 20 Complex (extensive) Patient / Family Education for ongoing care X- 1 10 Staff obtains Programmer, systems, Records, T Results / Process Orders est []  - 0 Staff telephones HHA, Nursing Homes / Clarify orders / etc []  - 0 Routine Transfer to another Facility (non-emergent condition) []  - 0 Routine Hospital Admission (non-emergent condition) X- 1 15 New Admissions / Biomedical engineer / Ordering NPWT Apligraf, etc. , []  - 0 Emergency Hospital Admission (emergent condition) X- 1 10 Simple Discharge Coordination []  - 0 Complex (extensive) Discharge Coordination PROCESS - Special Needs []  - 0 Pediatric / Minor Patient Management []  - 0 Isolation Patient Management []  - 0 Hearing / Language / Visual special needs []  - 0 Assessment of Community assistance (transportation, D/C planning, etc.) []  - 0 Additional assistance / Altered mentation []  - 0 Support Surface(s) Assessment (bed, cushion,  seat, etc.) INTERVENTIONS - Wound Cleansing / Measurement X- 1 5 Wound Imaging (photographs - any number of wounds) []  - 0 Wound Tracing (instead of photographs) []  - 0 Simple Wound Measurement - one wound X- 10 5 Complex Wound Measurement - multiple wounds []  - 0 Simple Wound Cleansing - one wound X-  Other Clinician: Referring Donnalynn Wheeless: Treating Oris Staffieri/Extender: Wilber Oliphant, Amy Weeks in Treatment: 0 Active Inactive Orientation to the Wound Care Program Nursing Diagnoses: Knowledge deficit related to the wound healing center program Goals: Patient/caregiver will verbalize understanding of the [place] Date Initiated: 05/06/2021 Target Resolution Date: 05/25/2021 Goal Status: Active Interventions: Provide education on orientation to the wound center Notes: Wound/Skin Impairment Nursing Diagnoses: Impaired tissue integrity Knowledge deficit related to ulceration/compromised skin integrity Goals: Patient will have a decrease in wound volume by X% from date: (specify in notes) Date Initiated: 05/06/2021 Target Resolution Date: 05/25/2021 Goal Status: Active Patient/caregiver will verbalize understanding of skin care regimen Date Initiated: 05/06/2021 Target Resolution Date: 05/25/2021 Goal Status:  Active Interventions: Assess patient/caregiver ability to obtain necessary supplies Assess patient/caregiver ability to perform ulcer/skin care regimen upon admission and as needed Assess ulceration(s) every visit Notes: Electronic Signature(s) Signed: 05/06/2021 11:40:20 AM By: Rhae Hammock RN Entered By: Rhae Hammock on 05/06/2021 08:31:19 -------------------------------------------------------------------------------- Pain Assessment Details Patient Name: Date of Service: Melissa Case 05/06/2021 7:30 A M Medical Record Number: [id number] Patient Account Number: [id number] Date of Birth/Sex: Treating RN: 08-Nov-1960 (60 y.o. Tonita Phoenix, Lauren Primary Care Donevan Biller: Durene Fruits Other Clinician: Referring Shinita Mac: Treating Zalea Pete/Extender: Wilber Oliphant, Amy Weeks in Treatment: 0 Active Problems Location of Pain Severity and Description of Pain Patient Has Paino Yes Site Locations Pain Location: Pain in Ulcers With Dressing Change: Yes Duration of the Pain. Constant / Intermittento Intermittent Rate the pain. Current Pain Level: 8 Worst Pain Level: 10 Least Pain Level: 0 Tolerable Pain Level: 8 Character of Pain Describe the Pain: Aching Pain Management and Medication Current Pain Management: Medication: No Cold Application: No Rest: No Massage: No Activity: No T.E.N.S.: No Heat Application: No Leg drop or elevation: No Is the Current Pain Management Adequate: Adequate How does your wound impact your activities of daily livingo Sleep: No Bathing: No Appetite: No Relationship With Others: No Bladder Continence: No Emotions: No Bowel Continence: No Work: No Toileting: No Drive: No Dressing: No Hobbies: No Electronic Signature(s) Signed: 05/06/2021 11:40:20 AM By: Rhae Hammock RN Entered By: Rhae Hammock on 05/06/2021  07:48:31 -------------------------------------------------------------------------------- Patient/Caregiver Education Details Patient Name: Date of Service: Melissa Case 12/16/2022andnbsp7:30 A M Medical Record Number: [id number] Patient Account Number: [id number] Date of Birth/Gender: Treating RN: 10/04/1960 (60 y.o. Tonita Phoenix, Lauren Primary Care Physician: Durene Fruits Other Clinician: Referring Physician: Treating Physician/Extender: Brennan Bailey Weeks in Treatment: 0 Education Assessment Education Provided To: Patient Education Topics Provided Welcome T [facility] [place]: o Methods: Explain/Verbal Responses: State content correctly Electronic Signature(s) Signed: 05/06/2021 11:40:20 AM By: Rhae Hammock RN Entered By: Rhae Hammock on 05/06/2021 08:31:28 -------------------------------------------------------------------------------- Wound Assessment Details Patient Name: Date of Service: Melissa Case 05/06/2021 7:30 A M Medical Record Number: [id number] Patient Account Number: [id number] Date of Birth/Sex: Treating RN: [birth year] (60 y.o. Tonita Phoenix, Lauren Primary Care Chrles Selley: Durene Fruits Other Clinician: Referring Rashanda Magloire: Treating Jachob Mcclean/Extender: Wilber Oliphant, Amy Weeks in Treatment: 0 Wound Status Wound Number: 1 Primary Etiology: Pressure Ulcer Wound Location: Sacrum Wound Status: Open Wounding Event: Pressure Injury Comorbid History: Seizure Disorder Date Acquired: 04/21/2021 Weeks Of Treatment: 0 Clustered Wound: Yes Photos Photo Uploaded By: Donavan Burnet on 05/06/2021 14:11:53 Wound Measurements Length: (cm) 10 Width: (cm) 10.5 Depth: (cm) 0.1 Clustered Quantity: 10 Area: (cm) 82.467 Volume: (cm) 8.247 % Reduction in Area: 0% % Reduction in Volume: 0% Epithelialization: None Tunneling: No Undermining: No  Melissa Case ([id number]) , Visit Report for 05/06/2021 Allergy List Details Patient Name: Date of Service: Melissa Case 05/06/2021 7:30 A M Medical Record Number: [id number] Patient Account Number: [id number] Date of Birth/Sex: Treating RN: [birth year] (60 y.o. Tonita Phoenix, Lauren Primary Care Ylonda Storr: Durene Fruits Other Clinician: Referring Nusrat Encarnacion: Treating Antoinne Spadaccini/Extender: Wilber Oliphant, Amy Weeks in Treatment: 0 Allergies Active Allergies morphine Penicillins Allergy Notes Electronic Signature(s) Signed: 05/06/2021 11:40:20 AM By: Rhae Hammock RN Entered By: Rhae Hammock on 05/06/2021 07:47:42 -------------------------------------------------------------------------------- Arrival Information Details Patient Name: Date of Service: Melissa Case 05/06/2021 7:30 A M Medical Record Number: [id number] Patient Account Number: [id number] Date of Birth/Sex: Treating RN: 11-18-1960 (60 y.o. Tonita Phoenix, Lauren Primary Care Tanyika Barros: Durene Fruits Other Clinician: Referring Calloway Andrus: Treating Suhaas Agena/Extender: Wilber Oliphant, Amy Weeks in Treatment: 0 Visit Information Patient Arrived: Wheel Chair Arrival Time: 07:45 Accompanied By: husband Transfer Assistance: Manual Patient Identification Verified: Yes Secondary Verification Process Completed: Yes Patient Requires Transmission-Based Precautions: No Patient Has Alerts: No Electronic Signature(s) Signed: 05/06/2021 11:40:20 AM By: Rhae Hammock RN Entered By: Rhae Hammock on 05/06/2021 07:45:56 -------------------------------------------------------------------------------- Clinic Level of Care Assessment Details Patient Name: Date of Service: Melissa Case 05/06/2021 7:30 A M Medical Record Number: [id number] Patient Account Number: [id number] Date of Birth/Sex: Treating RN: March 03, 1961 (60 y.o. Tonita Phoenix, Lauren Primary Care Tambria Pfannenstiel: Durene Fruits Other  Clinician: Referring Janitza Revuelta: Treating Madysun Thall/Extender: Wilber Oliphant, Amy Weeks in Treatment: 0 Clinic Level of Care Assessment Items TOOL 2 Quantity Score X- 1 0 Use when only an EandM is performed on the INITIAL visit ASSESSMENTS - Nursing Assessment / Reassessment X- 1 20 General Physical Exam (combine w/ comprehensive assessment (listed just below) when performed on new pt. evals) X- 1 25 Comprehensive Assessment (HX, ROS, Risk Assessments, Wounds Hx, etc.) ASSESSMENTS - Wound and Skin A ssessment / Reassessment []  - 0 Simple Wound Assessment / Reassessment - one wound X- 10 5 Complex Wound Assessment / Reassessment - multiple wounds []  - 0 Dermatologic / Skin Assessment (not related to wound area) ASSESSMENTS - Ostomy and/or Continence Assessment and Care []  - 0 Incontinence Assessment and Management []  - 0 Ostomy Care Assessment and Management (repouching, etc.) PROCESS - Coordination of Care []  - 0 Simple Patient / Family Education for ongoing care X- 1 20 Complex (extensive) Patient / Family Education for ongoing care X- 1 10 Staff obtains Programmer, systems, Records, T Results / Process Orders est []  - 0 Staff telephones HHA, Nursing Homes / Clarify orders / etc []  - 0 Routine Transfer to another Facility (non-emergent condition) []  - 0 Routine Hospital Admission (non-emergent condition) X- 1 15 New Admissions / Biomedical engineer / Ordering NPWT Apligraf, etc. , []  - 0 Emergency Hospital Admission (emergent condition) X- 1 10 Simple Discharge Coordination []  - 0 Complex (extensive) Discharge Coordination PROCESS - Special Needs []  - 0 Pediatric / Minor Patient Management []  - 0 Isolation Patient Management []  - 0 Hearing / Language / Visual special needs []  - 0 Assessment of Community assistance (transportation, D/C planning, etc.) []  - 0 Additional assistance / Altered mentation []  - 0 Support Surface(s) Assessment (bed, cushion,  seat, etc.) INTERVENTIONS - Wound Cleansing / Measurement X- 1 5 Wound Imaging (photographs - any number of wounds) []  - 0 Wound Tracing (instead of photographs) []  - 0 Simple Wound Measurement - one wound X- 10 5 Complex Wound Measurement - multiple wounds []  - 0 Simple Wound Cleansing - one wound X-

## 2021-05-10 ENCOUNTER — Emergency Department (HOSPITAL_COMMUNITY): Payer: PRIVATE HEALTH INSURANCE

## 2021-05-10 ENCOUNTER — Inpatient Hospital Stay (HOSPITAL_COMMUNITY)
Admission: EM | Admit: 2021-05-10 | Discharge: 2021-05-14 | DRG: 175 | Disposition: A | Discharge status: Home Health | Payer: PRIVATE HEALTH INSURANCE | Attending: Family Medicine | Admitting: Family Medicine

## 2021-05-10 ENCOUNTER — Emergency Department (HOSPITAL_BASED_OUTPATIENT_CLINIC_OR_DEPARTMENT_OTHER): Payer: PRIVATE HEALTH INSURANCE

## 2021-05-10 ENCOUNTER — Other Ambulatory Visit: Payer: Self-pay

## 2021-05-10 ENCOUNTER — Encounter: Payer: Self-pay | Admitting: Emergency Medicine

## 2021-05-10 ENCOUNTER — Ambulatory Visit
Admission: EM | Admit: 2021-05-10 | Discharge: 2021-05-10 | Disposition: A | Discharge status: Home / Self Care | Payer: PRIVATE HEALTH INSURANCE

## 2021-05-10 ENCOUNTER — Encounter (HOSPITAL_COMMUNITY): Payer: Self-pay | Admitting: Emergency Medicine

## 2021-05-10 DIAGNOSIS — F410 Panic disorder [episodic paroxysmal anxiety] without agoraphobia: Secondary | ICD-10-CM | POA: Diagnosis present

## 2021-05-10 DIAGNOSIS — I2699 Other pulmonary embolism without acute cor pulmonale: Secondary | ICD-10-CM | POA: Diagnosis not present

## 2021-05-10 DIAGNOSIS — K219 Gastro-esophageal reflux disease without esophagitis: Secondary | ICD-10-CM | POA: Diagnosis present

## 2021-05-10 DIAGNOSIS — Z880 Allergy status to penicillin: Secondary | ICD-10-CM

## 2021-05-10 DIAGNOSIS — Z885 Allergy status to narcotic agent status: Secondary | ICD-10-CM

## 2021-05-10 DIAGNOSIS — J9601 Acute respiratory failure with hypoxia: Secondary | ICD-10-CM

## 2021-05-10 DIAGNOSIS — R0902 Hypoxemia: Secondary | ICD-10-CM

## 2021-05-10 DIAGNOSIS — E785 Hyperlipidemia, unspecified: Secondary | ICD-10-CM | POA: Diagnosis present

## 2021-05-10 DIAGNOSIS — I82403 Acute embolism and thrombosis of unspecified deep veins of lower extremity, bilateral: Secondary | ICD-10-CM

## 2021-05-10 DIAGNOSIS — G936 Cerebral edema: Secondary | ICD-10-CM | POA: Diagnosis present

## 2021-05-10 DIAGNOSIS — D696 Thrombocytopenia, unspecified: Secondary | ICD-10-CM | POA: Diagnosis present

## 2021-05-10 DIAGNOSIS — Z85818 Personal history of malignant neoplasm of other sites of lip, oral cavity, and pharynx: Secondary | ICD-10-CM

## 2021-05-10 DIAGNOSIS — G9389 Other specified disorders of brain: Secondary | ICD-10-CM

## 2021-05-10 DIAGNOSIS — Z818 Family history of other mental and behavioral disorders: Secondary | ICD-10-CM

## 2021-05-10 DIAGNOSIS — I82409 Acute embolism and thrombosis of unspecified deep veins of unspecified lower extremity: Secondary | ICD-10-CM | POA: Diagnosis present

## 2021-05-10 DIAGNOSIS — F32A Depression, unspecified: Secondary | ICD-10-CM | POA: Diagnosis present

## 2021-05-10 DIAGNOSIS — R7303 Prediabetes: Secondary | ICD-10-CM | POA: Diagnosis present

## 2021-05-10 DIAGNOSIS — D638 Anemia in other chronic diseases classified elsewhere: Secondary | ICD-10-CM | POA: Diagnosis present

## 2021-05-10 DIAGNOSIS — Z20822 Contact with and (suspected) exposure to covid-19: Secondary | ICD-10-CM | POA: Diagnosis present

## 2021-05-10 DIAGNOSIS — Z79899 Other long term (current) drug therapy: Secondary | ICD-10-CM

## 2021-05-10 DIAGNOSIS — G510 Bell's palsy: Secondary | ICD-10-CM | POA: Diagnosis present

## 2021-05-10 DIAGNOSIS — Z86718 Personal history of other venous thrombosis and embolism: Secondary | ICD-10-CM

## 2021-05-10 DIAGNOSIS — Z833 Family history of diabetes mellitus: Secondary | ICD-10-CM

## 2021-05-10 DIAGNOSIS — Z8249 Family history of ischemic heart disease and other diseases of the circulatory system: Secondary | ICD-10-CM

## 2021-05-10 DIAGNOSIS — Z803 Family history of malignant neoplasm of breast: Secondary | ICD-10-CM

## 2021-05-10 DIAGNOSIS — R569 Unspecified convulsions: Secondary | ICD-10-CM | POA: Diagnosis present

## 2021-05-10 DIAGNOSIS — F17290 Nicotine dependence, other tobacco product, uncomplicated: Secondary | ICD-10-CM | POA: Diagnosis present

## 2021-05-10 DIAGNOSIS — M7989 Other specified soft tissue disorders: Secondary | ICD-10-CM

## 2021-05-10 DIAGNOSIS — Z923 Personal history of irradiation: Secondary | ICD-10-CM

## 2021-05-10 LAB — RESP PANEL BY RT-PCR (FLU A&B, COVID) ARPGX2
Influenza A by PCR: NEGATIVE
Influenza B by PCR: NEGATIVE
SARS Coronavirus 2 by RT PCR: NEGATIVE

## 2021-05-10 LAB — CBC WITH DIFFERENTIAL/PLATELET
Abs Immature Granulocytes: 0.32 10*3/uL — ABNORMAL HIGH (ref 0.00–0.07)
Basophils Absolute: 0 10*3/uL (ref 0.0–0.1)
Basophils Relative: 1 %
Eosinophils Absolute: 0 10*3/uL (ref 0.0–0.5)
Eosinophils Relative: 0 %
HCT: 36.7 % (ref 36.0–46.0)
Hemoglobin: 11.8 g/dL — ABNORMAL LOW (ref 12.0–15.0)
Immature Granulocytes: 5 %
Lymphocytes Relative: 7 %
Lymphs Abs: 0.4 10*3/uL — ABNORMAL LOW (ref 0.7–4.0)
MCH: 33.1 pg (ref 26.0–34.0)
MCHC: 32.2 g/dL (ref 30.0–36.0)
MCV: 102.8 fL — ABNORMAL HIGH (ref 80.0–100.0)
Monocytes Absolute: 0.2 10*3/uL (ref 0.1–1.0)
Monocytes Relative: 4 %
Neutro Abs: 5 10*3/uL (ref 1.7–7.7)
Neutrophils Relative %: 83 %
Platelets: 152 10*3/uL (ref 150–400)
RBC: 3.57 MIL/uL — ABNORMAL LOW (ref 3.87–5.11)
RDW: 16.3 % — ABNORMAL HIGH (ref 11.5–15.5)
WBC: 6 10*3/uL (ref 4.0–10.5)
nRBC: 0.5 % — ABNORMAL HIGH (ref 0.0–0.2)

## 2021-05-10 LAB — FOLATE: Folate: 13 ng/mL (ref >5.9)

## 2021-05-10 LAB — COMPREHENSIVE METABOLIC PANEL
ALT: 41 U/L (ref 0–44)
AST: 25 U/L (ref 15–41)
Albumin: 2.8 g/dL — ABNORMAL LOW (ref 3.5–5.0)
Alkaline Phosphatase: 40 U/L (ref 38–126)
Anion gap: 13 (ref 5–15)
BUN: 13 mg/dL (ref 6–20)
CO2: 23 mmol/L (ref 22–32)
Calcium: 8.4 mg/dL — ABNORMAL LOW (ref 8.9–10.3)
Chloride: 97 mmol/L — ABNORMAL LOW (ref 98–111)
Creatinine, Ser: 0.54 mg/dL (ref 0.44–1.00)
GFR, Estimated: >60 mL/min (ref >60)
Glucose, Bld: 148 mg/dL — ABNORMAL HIGH (ref 70–99)
Potassium: 3.6 mmol/L (ref 3.5–5.1)
Sodium: 133 mmol/L — ABNORMAL LOW (ref 135–145)
Total Bilirubin: 0.5 mg/dL (ref 0.3–1.2)
Total Protein: 5.7 g/dL — ABNORMAL LOW (ref 6.5–8.1)

## 2021-05-10 LAB — BRAIN NATRIURETIC PEPTIDE: B Natriuretic Peptide: 42.4 pg/mL (ref 0.0–100.0)

## 2021-05-10 LAB — TROPONIN I (HIGH SENSITIVITY)
Troponin I (High Sensitivity): 11 ng/L (ref <18)
Troponin I (High Sensitivity): 14 ng/L (ref <18)

## 2021-05-10 LAB — ECHOCARDIOGRAM COMPLETE
Area-P 1/2: 2.91 cm2
Calc EF: 63.3 %
S' Lateral: 2.6 cm
Single Plane A2C EF: 65 %
Single Plane A4C EF: 63.3 %

## 2021-05-10 LAB — MRSA NEXT GEN BY PCR, NASAL: MRSA by PCR Next Gen: NOT DETECTED

## 2021-05-10 LAB — VITAMIN B12: Vitamin B-12: 713 pg/mL (ref 180–914)

## 2021-05-10 MED ORDER — FUROSEMIDE 10 MG/ML IJ SOLN
20.0000 mg | Freq: Once | INTRAMUSCULAR | Status: AC
  Administered 2021-05-10: 15:00:00 20 mg via INTRAVENOUS
  Filled 2021-05-10: qty 2

## 2021-05-10 MED ORDER — HEPARIN BOLUS VIA INFUSION
4000.0000 [IU] | Freq: Once | INTRAVENOUS | Status: AC
  Administered 2021-05-10: 19:00:00 4000 [IU] via INTRAVENOUS
  Filled 2021-05-10: qty 4000

## 2021-05-10 MED ORDER — DIVALPROEX SODIUM 500 MG PO DR TAB
750.0000 mg | DELAYED_RELEASE_TABLET | Freq: Two times a day (BID) | ORAL | Status: DC
  Administered 2021-05-10 – 2021-05-14 (×8): 750 mg via ORAL
  Filled 2021-05-10 (×9): qty 1

## 2021-05-10 MED ORDER — PANTOPRAZOLE SODIUM 40 MG PO TBEC
40.0000 mg | DELAYED_RELEASE_TABLET | Freq: Every day | ORAL | Status: DC
  Administered 2021-05-10 – 2021-05-14 (×5): 40 mg via ORAL
  Filled 2021-05-10 (×5): qty 1

## 2021-05-10 MED ORDER — PAROXETINE HCL 20 MG PO TABS
40.0000 mg | ORAL_TABLET | Freq: Every day | ORAL | Status: DC
  Administered 2021-05-10 – 2021-05-14 (×5): 40 mg via ORAL
  Filled 2021-05-10 (×5): qty 2

## 2021-05-10 MED ORDER — GERHARDT'S BUTT CREAM
TOPICAL_CREAM | Freq: Every day | CUTANEOUS | Status: DC
  Filled 2021-05-10: qty 1

## 2021-05-10 MED ORDER — HEPARIN (PORCINE) 25000 UT/250ML-% IV SOLN
1200.0000 [IU]/h | INTRAVENOUS | Status: DC
Start: 2021-05-10 — End: 2021-05-10
  Administered 2021-05-10: 19:00:00 1200 [IU]/h via INTRAVENOUS
  Filled 2021-05-10: qty 250

## 2021-05-10 MED ORDER — LEVETIRACETAM 500 MG PO TABS
500.0000 mg | ORAL_TABLET | Freq: Two times a day (BID) | ORAL | Status: DC
  Administered 2021-05-10 – 2021-05-14 (×8): 500 mg via ORAL
  Filled 2021-05-10 (×8): qty 1

## 2021-05-10 MED ORDER — APIXABAN 5 MG PO TABS
10.0000 mg | ORAL_TABLET | Freq: Two times a day (BID) | ORAL | Status: DC
  Administered 2021-05-10: 16:00:00 10 mg via ORAL
  Filled 2021-05-10: qty 2

## 2021-05-10 MED ORDER — HEPARIN (PORCINE) 25000 UT/250ML-% IV SOLN: 1200.0000 [IU]/h | INTRAVENOUS | Status: DC

## 2021-05-10 MED ORDER — IOHEXOL 350 MG/ML SOLN
55.0000 mL | Freq: Once | INTRAVENOUS | Status: AC | PRN
  Administered 2021-05-10: 17:00:00 55 mL via INTRAVENOUS

## 2021-05-10 MED ORDER — DEXAMETHASONE 4 MG PO TABS
4.0000 mg | ORAL_TABLET | Freq: Two times a day (BID) | ORAL | Status: DC
  Administered 2021-05-10 – 2021-05-14 (×8): 4 mg via ORAL
  Filled 2021-05-10 (×8): qty 1

## 2021-05-10 MED ORDER — APIXABAN 5 MG PO TABS: 5.0000 mg | ORAL_TABLET | Freq: Two times a day (BID) | ORAL | Status: DC

## 2021-05-10 NOTE — ED Triage Notes (Signed)
Patient here from Dr. Quintella Baton feet have been swollen for a few weeks, went to dr for same, they said her O2 was low and she needed to come be evaluated.

## 2021-05-10 NOTE — ED Provider Notes (Signed)
Patient here today for evaluation of shortness of breath, low oxygen levels as well as bilateral feet swelling.  She states that her feet have been swelling for the last couple weeks is unclear how long her oxygen has been lower than normal.  She does seem to have mild increased work of breathing in office and some shortness of breath.  Recommended further evaluation in the emergency department as I suspect she will need stat labs and imaging.  Husband is agreeable to drive her to [facility] ED after leaving our office.   Francene Finders, PA-C 05/10/21 [phone]

## 2021-05-10 NOTE — Hospital Course (Addendum)
Melissa Case is a 60 y.o.female with a history of parotid cancer s/p radiation resection, GERD, panic attacks, seizures who was admitted to the family practice teaching Service at [facility] for PE and bilateral DVTs. Her hospital course is detailed below:  Acute hypoxic respiratory failure 2/2 PE   bilateral DVTs Patient presented to ED with shortness of breath and bilateral lower extremity edema x2 weeks.  CXR noted mild, diffuse bilateral interstitial pulmonary opacity, likely edema.  CTA noted significantly bilateral pulmonary emboli and additionally a rounded pleural-based density in the posterior right lower lobe which may represent an early pulmonary infarct.  Echo notably showed LVEF 55 to 60% without regional wall motion abnormalities, mild concentric LVH, no evidence of right heart strain.  Patient received 1 dose of Eliquis in the ED.  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  Patient was transitioned to Eliquis 10 mg twice daily x7 days (start 12/22) and and will transition to then Eliquis 5 mg twice daily on 12/29.  Patient worked with physical therapy and Occupational Therapy who initially recommended SNF but on reevaluation home PT.  Patient did have oxygen requirement at discharge so was set up with home O2.  Anemia   Thrombocytopenia Patient with chronic anemia had acute decrease in hemoglobin that trended stable. Mild thrombocytopenia noted upon admission and trended stable. Upon discharge, Hgb 8.7, platelets 152.  Other chronic conditions were medically managed with home medications and formulary alternatives as necessary (GERD, panic attacks, seizure history)  PCP Follow-up Recommendations: Repeat CBC outpatient to monitor Hgb and platelets Ensure patient is working with home PT Patient should transition to Eliquis 5 mg twice daily on 12/29

## 2021-05-10 NOTE — ED Triage Notes (Signed)
Bilateral feet swelling increasing over the last week. Also states her O2 sats have been reading lower than usual. Denies previous hx of same. Reports feeling increasingly SOB, especially when she wakes up in the morning.

## 2021-05-10 NOTE — ED Provider Notes (Signed)
Emergency Medicine Provider Triage Evaluation Note  Melissa Case , a 60 y.o. female  was evaluated in triage.  Pt complains of worsening shortness of breath and bilateral lower extremity swelling.  She reports that has been ongoing for a couple of weeks.  She reports that she has been elevating her legs at home and using compression stockings without relief.  She has been evaluated by her PCP however they did not start her on any medications.  She denies any history of CHF.  She went to urgent care today and was found to be hypoxic at 89% on room air and sent to the ED for further evaluation.  She is not typically on oxygen at home.  She denies any chest pain specifically.  Review of Systems  Positive: + SOB, leg swelling Negative: - chest pain  Physical Exam  BP 124/71 (BP Location: Left Arm)    Pulse 99    Temp (!) 97.5 F (36.4 C) (Oral)    Resp 20    SpO2 97%  Gen:   Awake, no distress   Resp:  Normal effort  MSK:   Moves extremities without difficulty  Other:  2+ pitting edema bilaterally  Medical Decision Making  Medically screening exam initiated at 10:22 AM.  Appropriate orders placed.  Sujata Maines was informed that the remainder of the evaluation will be completed by another provider, this initial triage assessment does not replace that evaluation, and the importance of remaining in the ED until their evaluation is complete.     Eustaquio Maize, PA-C 05/10/21 1023    Carmin Muskrat, MD 05/10/21 [phone]

## 2021-05-10 NOTE — Progress Notes (Signed)
°  Echocardiogram 2D Echocardiogram has been performed.  Melissa Case 05/10/2021, 1:17 PM

## 2021-05-10 NOTE — ED Provider Notes (Signed)
[facility] EMERGENCY DEPARTMENT Provider Note   CSN: [id number] Arrival date & time: 05/10/21  0932     History Chief Complaint  Patient presents with   Shortness of Breath    Melissa Case is a 60 y.o. female with a history of parotid cancer status postradiation and resection, GERD, panic attacks, seizures he presents to the ED today with complaints of bilateral lower extremity edema and shortness of breath for the last 2 weeks.  States that she was seen by her PCP this morning who recommended ED evaluation.  Currently on 2 L of oxygen via nasal cannula.  Reports no home oxygen requirement.  She has been having to sleep on 2 pillows propped up at night.  For the edema in her legs, they have tried home SCDs without improvement.  She tends to be very tired and does not get out of bed much given fatigue from antiseizure medications.  Husband is at the bedside and notes that they have been trying to get her up more often.  Her last surgery was last year.  She is not on any blood thinners.  She takes steroids chronically for swelling in her brain.  In the last week she has had 3 falls trying to get up from the commode, husband reports that she did not hit her head.  EMS had to be called so to help her up, she was not seen in the hospital for this.  No chest pain.  No history of cardiac conditions.     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   Lesion of parotid gland 07/14/2019   Acute calculous cholecystitis 11/21/2017   Gastro-esophageal reflux disease  without esophagitis 02/03/2014   Abdominal pain 09/30/2013   SBO (small bowel obstruction) ([place]) 09/30/2013   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/05/2012   Gall stone 04/05/2012   Barrett's esophagus 04/05/2012   Cholelithiasis 03/27/2012   Hyperlipidemia 03/25/2012   Depression 09/15/2011   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    Past Surgical History:  Procedure Laterality Date   ABDOMINAL HYSTERECTOMY     CHOLECYSTECTOMY N/A 11/21/2017   Procedure: LAPAROSCOPIC CHOLECYSTECTOMY WITH INTRAOPERATIVE CHOLANGIOGRAM;  Surgeon: Jovita Kussmaul, MD;  Location: [facility];  Service: General;  Laterality: N/A;   MULTIPLE EXTRACTIONS WITH ALVEOLOPLASTY N/A 08/28/2019   Procedure: Extraction of tooth #'s 3-7, 10-15, and 20-31 with alveoloplatsy, maxillary right buccal exostosis reduction, and bilateral mandibular tori reductions.;  Surgeon: Lenn Cal, DDS;  Location: [facility];  Service: Oral Surgery;  Laterality: N/A;   PAROTIDECTOMY Right 07/22/2019   with biopsy; done at [facility] by Dr. Fredricka Bonine     OB History   No obstetric history on file.     Family History  Problem Relation Age of Onset   Depression Sister    Heart disease Mother    Diabetes Father    Breast cancer Paternal Grandmother    Colon cancer Neg Hx    Pancreatic cancer Neg Hx    Esophageal cancer Neg  Hx    Stomach cancer Neg Hx    Rectal cancer Neg Hx    Liver cancer Neg Hx     Social History   Tobacco Use   Smoking status: Former    Packs/day: 0.50    Years: 42.00    Pack years: 21.00    Types: Cigarettes    Start date: 75    Quit date: 01/16/2021    Years since quitting: 0.3   Smokeless tobacco: Never  Vaping Use   Vaping Use: Every day  Substance Use Topics   Alcohol use: No    Alcohol/week: 0.0 standard drinks   Drug use: No    Home Medications Prior to Admission medications   Medication Sig Start Date End Date Taking? Authorizing Provider  dexamethasone (DECADRON) 4 MG tablet Take 1 tablet (4 mg  total) by mouth every 6 (six) hours. 01/02/21  Yes Pokhrel, Laxman, MD  divalproex (DEPAKOTE) 250 MG DR tablet Take 1 tablet (250 mg total) by mouth 2 (two) times daily. (In addition to 500mg  twice daily for a total dosage of 750mg  BID). 04/19/21  Yes Alric Ran, MD  divalproex (DEPAKOTE) 500 MG DR tablet Take 1 tablet (500 mg total) by mouth 2 (two) times daily. 03/16/21 05/10/21 Yes Alric Ran, MD  esomeprazole (NEXIUM) 40 MG capsule Take 1 capsule by mouth once daily 05/03/21  Yes Thornton Park, MD  levETIRAcetam (KEPPRA) 500 MG tablet Take 1 tablet (500 mg total) by mouth 2 (two) times daily. 03/28/21 05/10/21 Yes Camara, Maryan Puls, MD  PARoxetine (PAXIL) 40 MG tablet Take 1 tablet (40 mg total) by mouth daily. 04/01/21  Yes Arfeen, Arlyce Harman, MD  mupirocin cream (BACTROBAN) 2 % Apply 1 application topically 2 (two) times daily. Patient not taking: Reported on 05/10/2021 03/11/21   Camillia Herter, NP  ondansetron (ZOFRAN-ODT) 4 MG disintegrating tablet Take 1 tablet (4 mg total) by mouth every 8 (eight) hours as needed. Patient not taking: Reported on 05/10/2021 04/14/21   Sherwood Gambler, MD    Allergies    Morphine and Penicillins  Review of Systems   Review of Systems  HENT:  Negative for congestion, rhinorrhea and sore throat.   Respiratory:  Positive for shortness of breath.   Cardiovascular:  Positive for leg swelling. Negative for chest pain.  Gastrointestinal:  Positive for nausea. Negative for abdominal pain, diarrhea and vomiting.  Genitourinary:  Negative for difficulty urinating, dysuria and frequency.  Neurological:  Positive for tremors and weakness. Negative for dizziness, seizures, syncope and light-headedness.   Physical Exam Updated Vital Signs BP 115/74    Pulse 92    Temp (!) 97.4 F (36.3 C)    Resp (!) 21    SpO2 99%   Physical Exam Constitutional:      General: She is not in acute distress.    Appearance: She is well-developed. She is obese. She is not  ill-appearing or toxic-appearing.  HENT:     Head:     Comments: S/p right parotid resection. Has residual right-sided facial droop. Cardiovascular:     Rate and Rhythm: Normal rate and regular rhythm.  Pulmonary:     Effort: Tachypnea present.     Comments: On 2L [place]. Rales to b/l lower bases. Tachypnea.  Chest:     Chest wall: No tenderness.  Abdominal:     General: Bowel sounds are normal.     Palpations: Abdomen is soft. There is no mass.     Tenderness: There is no abdominal  tenderness. There is no guarding or rebound.  Musculoskeletal:     Cervical back: Neck supple.     Comments: Decreased active ROM of b/l lower extremities 2/2 chronic weakness  Skin:    General: Skin is warm.  Neurological:     Comments: Chronic right-sided facial droop 2/2 parotid resection. Speech is clear, able to follow commands. 5/5 grip strength upper extremities. Able to lift b/l lower extremities against gravity.    ED Results / Procedures / Treatments   Labs (all labs ordered are listed, but only abnormal results are displayed) Labs Reviewed  COMPREHENSIVE METABOLIC PANEL - Abnormal; Notable for the following components:      Result Value   Sodium 133 (*)    Chloride 97 (*)    Glucose, Bld 148 (*)    Calcium 8.4 (*)    Total Protein 5.7 (*)    Albumin 2.8 (*)    All other components within normal limits  CBC WITH DIFFERENTIAL/PLATELET - Abnormal; Notable for the following components:   RBC 3.57 (*)    Hemoglobin 11.8 (*)    MCV 102.8 (*)    RDW 16.3 (*)    nRBC 0.5 (*)    Lymphs Abs 0.4 (*)    Abs Immature Granulocytes 0.32 (*)    All other components within normal limits  BRAIN NATRIURETIC PEPTIDE  TROPONIN I (HIGH SENSITIVITY)  TROPONIN I (HIGH SENSITIVITY)    EKG None  Radiology DG Chest 2 View  Result Date: 05/10/2021 CLINICAL DATA:  Shortness of breath EXAM: CHEST - 2 VIEW COMPARISON:  04/20/2021 FINDINGS: The heart size and mediastinal contours are within normal limits.  Mild, diffuse bilateral interstitial pulmonary opacity. Disc degenerative disease of the thoracic spine. IMPRESSION: Mild, diffuse bilateral interstitial pulmonary opacity, likely edema. No focal airspace opacity. Electronically Signed   By: Delanna Ahmadi M.D.   On: 05/10/2021 10:57   EEG adult  Result Date: 05/09/2021 Alric Ran, MD     05/09/2021 10:01 AM History: 78 yof with seizures This RVEEG was performed using equipment provided by [employer] utilizing Bluetooth ( Trackit ) amplifiers with EEGT attended video collected and performed with a 19-channel digital VEEG, Data was acquired with a minimum of 21 bipolar connections and sampled at a minimum rate of 250 cycles per second per channel and one channel for EKG, obtained according to 10-20 international electrode placement system, reformatted digitally into referential and bipolar montages, reviewed and verified for accuracy and validity by the governing reading neurologist in full details. Activating Procedure: Photic Stimulation was Performed. Photic Driving Response seen. Hyperventilation was not performed due to pt contraindication of Droplet Precaution-COVID-19. No clear epileptiform activity was detected by the reviewing neurodiagnostic technologist for further evaluation. Intermittent artifacts could not be resolved. All impedance under 10K ohms. Pt is awake, alert, cooperative and orientated Xs 4 of 4? correct. EEGT Attended Video Recording: Yes Hardware - (Trackit) by [place] Description: This RVEEG was obtained during awake and drowsy states, during wakefulness posterior dominant rhythm was 8 Hz, alpha, well-modulated, moderate in voltage, and attenuated appropriately with eye opening. During the recording there was no focal or lateralized epileptiform activity or focal slowing noted. Drowsiness was marked by stage I sleep. Hyperventilation was not performed due to pt contraindication of Droplet Precaution. Photic  stimulation resulted in posterior driving responses at multiple frequencies, without any abnormal photo paroxysmal responses. EKG normal sinus rhythm.  RVEEG Technical Summary of Findings Impression and Interpretations: This is a normal  routine video EEG. However a normal EEG does not exclude a diagnosis of epilepsy. Alric Ran, MD [facility]    ECHOCARDIOGRAM COMPLETE  Result Date: 05/10/2021    ECHOCARDIOGRAM REPORT   Patient Name:   Melissa Case Date of Exam: 05/10/2021 Medical Rec #:  [id number]      Height:       64.0 in Accession #:    [id number]     Weight:       178.0 lb Date of Birth:  1960/06/10      BSA:          1.862 m Patient Age:    79 years       BP:           129/75 mmHg Patient Gender: F              HR:           76 bpm. Exam Location:  Inpatient Procedure: 2D Echo, 3D Echo, Cardiac Doppler and Color Doppler Indications:    I50.40* Unspecified combined systolic (congestive) and diastolic                 (congestive) heart failure  History:        Patient has no prior history of Echocardiogram examinations.                 Risk Factors:Dyslipidemia. Brain cancer. Seizure.  Sonographer:    Roseanna Rainbow RDCS Referring Phys: [place]  Sonographer Comments: Image acquisition challenging due to patient body habitus. IMPRESSIONS  1. Left ventricular ejection fraction, by estimation, is 55 to 60%. The left ventricle has normal function. The left ventricle has no regional wall motion abnormalities. There is mild concentric left ventricular hypertrophy. Left ventricular diastolic parameters are indeterminate.  2. Right ventricular systolic function is normal. The right ventricular size is normal. Tricuspid regurgitation signal is inadequate for assessing PA pressure.  3. The mitral valve is normal in structure. No evidence of mitral valve regurgitation. No evidence of mitral stenosis.  4. The aortic valve is tricuspid. Aortic valve regurgitation is not visualized. No  aortic stenosis is present.  5. The inferior vena cava is normal in size with greater than 50% respiratory variability, suggesting right atrial pressure of 3 mmHg. Comparison(s): No prior Echocardiogram. Conclusion(s)/Recommendation(s): Normal biventricular function without evidence of hemodynamically significant valvular heart disease. FINDINGS  Left Ventricle: Left ventricular ejection fraction, by estimation, is 55 to 60%. The left ventricle has normal function. The left ventricle has no regional wall motion abnormalities. The left ventricular internal cavity size was normal in size. There is  mild concentric left ventricular hypertrophy. Left ventricular diastolic parameters are indeterminate. Right Ventricle: The right ventricular size is normal. No increase in right ventricular wall thickness. Right ventricular systolic function is normal. Tricuspid regurgitation signal is inadequate for assessing PA pressure. Left Atrium: Left atrial size was normal in size. Right Atrium: Right atrial size was normal in size. Pericardium: There is no evidence of pericardial effusion. Mitral Valve: The mitral valve is normal in structure. No evidence of mitral valve regurgitation. No evidence of mitral valve stenosis. Tricuspid Valve: The tricuspid valve is normal in structure. Tricuspid valve regurgitation is trivial. No evidence of tricuspid stenosis. Aortic Valve: The aortic valve is tricuspid. Aortic valve regurgitation is not visualized. No aortic stenosis is present. Pulmonic Valve: The pulmonic valve was grossly normal. Pulmonic valve regurgitation is trivial. No evidence of pulmonic stenosis. Aorta: The aortic root, ascending aorta,  aortic arch and descending aorta are all structurally normal, with no evidence of dilitation or obstruction. Venous: The inferior vena cava is normal in size with greater than 50% respiratory variability, suggesting right atrial pressure of 3 mmHg. IAS/Shunts: The atrial septum is grossly  normal.  LEFT VENTRICLE PLAX 2D LVIDd:         3.80 cm     Diastology LVIDs:         2.60 cm     LV e' medial:    4.03 cm/s LV PW:         1.40 cm     LV E/e' medial:  15.8 LV IVS:        1.40 cm     LV e' lateral:   5.66 cm/s LVOT diam:     2.30 cm     LV E/e' lateral: 11.3 LV SV:         88 LV SV Index:   48 LVOT Area:     4.15 cm                             3D Volume EF: LV Volumes (MOD)           3D EF:        57 % LV vol d, MOD A2C: 45.1 ml LV EDV:       132 ml LV vol d, MOD A4C: 45.0 ml LV ESV:       56 ml LV vol s, MOD A2C: 15.8 ml LV SV:        75 ml LV vol s, MOD A4C: 16.5 ml LV SV MOD A2C:     29.3 ml LV SV MOD A4C:     45.0 ml LV SV MOD BP:      28.4 ml RIGHT VENTRICLE             IVC RV S prime:     10.00 cm/s  IVC diam: 1.40 cm TAPSE (M-mode): 1.3 cm LEFT ATRIUM             Index        RIGHT ATRIUM          Index LA diam:        3.40 cm 1.83 cm/m   RA Area:     6.60 cm LA Vol (A2C):   25.6 ml 13.75 ml/m  RA Volume:   8.65 ml  4.65 ml/m LA Vol (A4C):   15.9 ml 8.54 ml/m LA Biplane Vol: 21.5 ml 11.55 ml/m  AORTIC VALVE             PULMONIC VALVE LVOT Vmax:   105.00 cm/s PR End Diast Vel: 1.71 msec LVOT Vmean:  81.100 cm/s LVOT VTI:    0.213 m  AORTA Ao Root diam: 3.00 cm Ao Asc diam:  3.40 cm MITRAL VALVE MV Area (PHT): 2.91 cm    SHUNTS MV Decel Time: 261 msec    Systemic VTI:  0.21 m MV E velocity: 63.85 cm/s  Systemic Diam: 2.30 cm MV A velocity: 92.30 cm/s MV E/A ratio:  0.69 Buford Dresser MD Electronically signed by Buford Dresser MD Signature Date/Time: 05/10/2021/3:09:16 PM    Final    AMBULATORY EEG  Result Date: 05/09/2021 Alric Ran, MD     05/09/2021 10:04 AM History: 14 yof with seizure TECHNICAL DESCRIPTION: This AVEEG was performed using equipment provided by [employer] utilizing Bluetooth (Trackit ) amplifiers with  continuous EEGT attended video collection using encrypted remote transmission via [place] secured cellular tower network with data rates  for each AVEEG performed. This is a Biomedical engineer AVEEG, obtained, according to the 10-20 international electrode placement system, reformatted digitally into referential and bipolar montages. Data was acquired with a minimum of 21 bipolar connections and sampled at a minimum rate of 250 cycles per second per channel, maximum rate of 450 cycles per second per channel and two channels for EKG. The entire AVEEG study was recorded through cable and or radio telemetry for subsequent analysis. Specified epochs of the AVEEG data were identified at the direction of the subject by the depression of a push button by the patient. Each patient's event file included data acquired two minutes prior to the push button activation and continuing until two minutes afterwards. AVEEG files were reviewed on [place] by [employer] provided [employer] with a digital high frequency filter set at 70 Hz and a low frequency filter set at 0.1 Hz with a paper speed of 13mm/s resulting in 10 seconds per digital page. This entire AVEEG was reviewed by the EEG Technologist. Random time samples, random sleep samples, clips, patient initiated push button files with included patient daily diary logs, EEG Technologist bookmarked note data was reviewed and verified for accuracy and validity by the governing reading neurologist in full details. This AEEGV was fully compliant with all requirements for CPT 97500 for setup, patient education, take down and administered by an EEG technologist. INTERMITTENT MONITORING WITH VIDEO TECHNICAL DESCRIPTION:  This Long-Term AVEEG was monitored intermittently by a qualified EEG technologist for the entirety of the recording; quality check-ins were performed at a minimum of every two hours, checking and documenting real-time data and video to assure the integrity and quality of the recording (e.g., camera position, electrode integrity and impedance), and  identify the need for maintenance. For intermittent monitoring, an EEG Technologist monitored no more than 12 patients concurrently. Diagnostic video was captured at least 80% of the time during the recording. TECHNOLOGIST EVENTS: No clear epileptiform activity was detected by the reviewing neurodiagnostic technologist during the recording for further evaluation. PATIENT EVENTS:  The patient pushed the event button 0 times during the AVEEG recording for further evaluation. However, EEGT test press #1 time with pt at the beginning of the tracing. TIME SAMPLES: 1 minute of every hour recorded are reviewed as random time samples. SLEEP SAMPLES: 5 minutes of every 24 hour recorded sleep cycle are reviewed as random sleep samples. BACKGROUND EEG: This AVEEG consists of well-modulated bilateral synchronous and symmetrical background in the alpha frequencies in the awake state. AWAKE: The posterior dominant rhythm was characterized by symmetric and reactive 8-8.5 Hz activity with eyes closed. SLEEP: Stage two sleep displayed Sleep Spindles and Vertex Waveform components. All other sleep stages appeared symmetrical and synchronous with regards to K-Complexes, and REM. EKG: Normal sinus rhythm. AVEEG Technical Summary of Findings Impression and Interpretations: This is a normal ambulatory video EEG. There were no events recorded. A normal EEG does not exclude a diagnosis of epilepsy. Alric Ran, MD [facility]    VAS [place] LOWER EXTREMITY VENOUS (DVT) (ONLY [facility] & [facility])  Result Date: 05/10/2021  Lower Venous DVT Study Patient Name:  Melissa Case  Date of Exam:   05/10/2021 Medical Rec #: [id number]       Accession #:    [id number] Date of Birth: 05-Feb-1961       Patient Gender:  F Patient Age:   29 years Exam Location:  [facility] Procedure:      VAS [place] LOWER EXTREMITY VENOUS (DVT) Referring Phys: ROBERT LOCKWOOD --------------------------------------------------------------------------------   Indications: Bilateral swelling for two weeks., and SOB.  Comparison Study: No priors Performing Technologist: Velva Harman Stur[place] RDMS, RVT  Examination Guidelines: A complete evaluation includes B-mode imaging, spectral Doppler, color Doppler, and power Doppler as needed of all accessible portions of each vessel. Bilateral testing is considered an integral part of a complete examination. Limited examinations for reoccurring indications may be performed as noted. The reflux portion of the exam is performed with the patient in reverse Trendelenburg.  +---------+---------------+---------+-----------+----------+-----------------+  RIGHT     Compressibility Phasicity Spontaneity Properties Thrombus Aging     +---------+---------------+---------+-----------+----------+-----------------+  CFV       Full            Yes       Yes                                       +---------+---------------+---------+-----------+----------+-----------------+  SFJ       Full                                                                +---------+---------------+---------+-----------+----------+-----------------+  FV Prox   Full                                                                +---------+---------------+---------+-----------+----------+-----------------+  FV Mid    Full                                                                +---------+---------------+---------+-----------+----------+-----------------+  FV Distal Full                                                                +---------+---------------+---------+-----------+----------+-----------------+  PFV       Full                                                                +---------+---------------+---------+-----------+----------+-----------------+  POP       None            No        No  Age Indeterminate  +---------+---------------+---------+-----------+----------+-----------------+  PTV       Partial                                           Age Indeterminate  +---------+---------------+---------+-----------+----------+-----------------+  PERO      None                                             Age Indeterminate  +---------+---------------+---------+-----------+----------+-----------------+   +---------+---------------+---------+-----------+----------+-----------------+  LEFT      Compressibility Phasicity Spontaneity Properties Thrombus Aging     +---------+---------------+---------+-----------+----------+-----------------+  CFV       Full            Yes       Yes                                       +---------+---------------+---------+-----------+----------+-----------------+  SFJ       Full                                                                +---------+---------------+---------+-----------+----------+-----------------+  FV Prox   Full                                                                +---------+---------------+---------+-----------+----------+-----------------+  FV Mid    Full                                                                +---------+---------------+---------+-----------+----------+-----------------+  FV Distal Full                                                                +---------+---------------+---------+-----------+----------+-----------------+  PFV       None            No        No                     Age Indeterminate  +---------+---------------+---------+-----------+----------+-----------------+  POP       None            No        No                     Age Indeterminate  +---------+---------------+---------+-----------+----------+-----------------+  PTV       Partial  Age Indeterminate  +---------+---------------+---------+-----------+----------+-----------------+  PERO      None                                             Age Indeterminate  +---------+---------------+---------+-----------+----------+-----------------+     Summary: RIGHT: -  Findings consistent with age indeterminate deep vein thrombosis involving the right popliteal vein, right posterior tibial veins, and right peroneal veins.  LEFT: - Findings consistent with age indeterminate deep vein thrombosis involving the left proximal profunda vein, left popliteal vein, left peroneal veins, and left posterior tibial veins.  *See table(s) above for measurements and observations. Electronically signed by Deitra Mayo MD on 05/10/2021 at 3:57:31 PM.    Final     Procedures Procedures   Medications Ordered in ED Medications  apixaban (ELIQUIS) tablet 10 mg (has no administration in time range)    Followed by  apixaban (ELIQUIS) tablet 5 mg (has no administration in time range)  furosemide (LASIX) injection 20 mg (20 mg Intravenous Given 05/10/21 1455)    ED Course  I have reviewed the triage vital signs and the nursing notes.  Pertinent labs & imaging results that were available during my care of the patient were reviewed by me and considered in my medical decision making (see chart for details).    MDM Rules/Calculators/A&P                         Melissa Case is a 60 y.o. 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 Now with new oxygen requirement of 2L [place]. Examination remarkable for rales to b/l bases, 3+ pitting edema to shin b/l.   Work up thus far with BNP 42.4, troponin 11, CMP with slight hyponatremia 133 (could be from hypervolemia), CBC with slight macrocytic anemia Hgb 11.8. CXR with mild diffuse b/l interstitial pulmonary opacity, likely edema. She appears hypervolemic. She is lasix naive, will start with IV Lasix 20 mg and reassess. Will order Echo to evaluate for new onset HF. Other differential includes DVT/PE given b/l edema.  Patient is sedentary at baseline and has history of cancer  which would put her at increased risk. Appears that she had a brain MRI recently which showed right temporal lobe lesion concerning for possible metastasis or radiation necrosis. Patient scheduled with Neurosurgery this month. She has a smoking history and may have component of COPD but that wouldn't explain her edema.  Wells score for DVT is 4, placing her at high risk. Wells score for PE 5.5, moderate risk. Will proceed with CTA chest and b/l DVT U/S to assess. Anticipate admission given new oxygen requirement.   D/w Vascular U/S technician who reports preliminary results of age-indeterminate DVT involving right popliteal vein, right PT veins and right peroneal veins along with left proximal profunda vein, left popliteal vein and left peroneal veins and left posterior tibial veins.   1436: IV Lasix has been delayed as patient is a very difficult stick. IV Team is at bedside.  1445: IV Team successful. IV placement to right AC. Will start on Eliquis 10 mg BID x7 days followed by 5 mg BID for duration of 3 months.   1514: Echo returned with EF 55-60%, no  regional wall motion abnormalities. Mild LVH. Left ventricular diastolic parameters are indeterminate.   1552: Still on 2L [place]. Has put out 600 cc urine thus far. Awaiting CTA to assess for PE. Given new oxygen requirement, patient will need admission. Consult for unassigned admission.   1600: D/w family medicine, Dr. Madison Hickman, who accepts patient for admission.  Final Clinical Impression(s) / ED Diagnoses Final diagnoses:  Acute respiratory failure with hypoxia ([place])  Deep vein thrombosis (DVT) of both lower extremities, unspecified chronicity, unspecified vein [facility])    Rx / DC Orders ED Discharge Orders     None        Sharion Settler, DO 05/10/21 1603    Carmin Muskrat, MD 05/11/21 [id number]

## 2021-05-10 NOTE — ED Notes (Signed)
Pt O2 89-90% RA in triage, pt placed on [place] 2LPM @ this time.

## 2021-05-10 NOTE — Progress Notes (Signed)
Venous duplex lower extremities  has been completed. Refer to [facility] under chart review to view preliminary results.   05/10/2021  2:05 PM Kraven Calk, Bonnye Fava

## 2021-05-10 NOTE — ED Notes (Signed)
Patient is being discharged from the Urgent Care and sent to the Emergency Department via POV . Per Ewell Poe PA, patient is in need of higher level of care due to hypoxia, feet swelling, R/O heart failure. Patient is aware and verbalizes understanding of plan of care.  Vitals:   05/10/21 0915  BP: 108/75  Pulse: (!) 104  Resp: 20  Temp: 97.8 F (36.6 C)  SpO2: (!) 89%

## 2021-05-10 NOTE — ED Notes (Signed)
SLEEP: Stage two sleep displayed Sleep Spindles and Vertex Waveform components. All other sleep stages appeared symmetrical and synchronous with regards to K-Complexes, and REM. EKG: Normal sinus rhythm. AVEEG Technical Summary of Findings Impression and Interpretations: This is a normal ambulatory video EEG. There were no events recorded. A normal EEG does not exclude a diagnosis of epilepsy. Alric Ran, MD [facility]    VAS [place] LOWER EXTREMITY VENOUS (DVT) (ONLY [facility] & [facility])  Result Date: 05/10/2021  Lower Venous DVT Study Patient Name:  Melissa Case  Date of Exam:   05/10/2021 Medical Rec #: [id number]       Accession #:    [id number] Date of Birth: 11-May-1961       Patient Gender: F Patient Age:   60 years Exam Location:  [facility] Procedure:      VAS [place] LOWER EXTREMITY VENOUS (DVT) Referring Phys: Herbie Baltimore LOCKWOOD --------------------------------------------------------------------------------  Indications: Bilateral swelling for two weeks., and SOB.  Comparison Study: No priors Performing Technologist: Velva Harman Sturdivant RDMS, RVT  Examination Guidelines: A complete evaluation includes B-mode imaging, spectral Doppler, color Doppler, and power Doppler as needed of all accessible portions of each vessel. Bilateral testing is considered an integral part of a complete examination. Limited examinations for reoccurring indications may be performed as noted. The reflux portion of the exam is performed with the patient in reverse Trendelenburg.   +---------+---------------+---------+-----------+----------+-----------------+  RIGHT     Compressibility Phasicity Spontaneity Properties Thrombus Aging     +---------+---------------+---------+-----------+----------+-----------------+  CFV       Full            Yes       Yes                                       +---------+---------------+---------+-----------+----------+-----------------+  SFJ       Full                                                                +---------+---------------+---------+-----------+----------+-----------------+  FV Prox   Full                                                                +---------+---------------+---------+-----------+----------+-----------------+  FV Mid    Full                                                                +---------+---------------+---------+-----------+----------+-----------------+  FV Distal Full                                                                +---------+---------------+---------+-----------+----------+-----------------+  SLEEP: Stage two sleep displayed Sleep Spindles and Vertex Waveform components. All other sleep stages appeared symmetrical and synchronous with regards to K-Complexes, and REM. EKG: Normal sinus rhythm. AVEEG Technical Summary of Findings Impression and Interpretations: This is a normal ambulatory video EEG. There were no events recorded. A normal EEG does not exclude a diagnosis of epilepsy. Alric Ran, MD [facility]    VAS [place] LOWER EXTREMITY VENOUS (DVT) (ONLY [facility])  Result Date: 05/10/2021  Lower Venous DVT Study Patient Name:  Melissa Case  Date of Exam:   05/10/2021 Medical Rec #: [id number]       Accession #:    [id number] Date of Birth: 11-May-1961       Patient Gender: F Patient Age:   60 years Exam Location:  [facility] Procedure:      VAS [place] LOWER EXTREMITY VENOUS (DVT) Referring Phys: Herbie Baltimore LOCKWOOD --------------------------------------------------------------------------------  Indications: Bilateral swelling for two weeks., and SOB.  Comparison Study: No priors Performing Technologist: Velva Harman Sturdivant RDMS, RVT  Examination Guidelines: A complete evaluation includes B-mode imaging, spectral Doppler, color Doppler, and power Doppler as needed of all accessible portions of each vessel. Bilateral testing is considered an integral part of a complete examination. Limited examinations for reoccurring indications may be performed as noted. The reflux portion of the exam is performed with the patient in reverse Trendelenburg.   +---------+---------------+---------+-----------+----------+-----------------+  RIGHT     Compressibility Phasicity Spontaneity Properties Thrombus Aging     +---------+---------------+---------+-----------+----------+-----------------+  CFV       Full            Yes       Yes                                       +---------+---------------+---------+-----------+----------+-----------------+  SFJ       Full                                                                +---------+---------------+---------+-----------+----------+-----------------+  FV Prox   Full                                                                +---------+---------------+---------+-----------+----------+-----------------+  FV Mid    Full                                                                +---------+---------------+---------+-----------+----------+-----------------+  FV Distal Full                                                                +---------+---------------+---------+-----------+----------+-----------------+  impedance under 10K ohms. Pt is awake, alert, cooperative and orientated Xs 4 of 4? correct. EEGT Attended Video Recording: Yes Hardware - (Trackit) by [place] Description: This RVEEG was obtained during awake and drowsy states, during wakefulness posterior dominant rhythm was 8 Hz, alpha, well-modulated, moderate in voltage, and attenuated appropriately with eye opening. During the recording there was no focal or lateralized epileptiform activity or focal slowing noted. Drowsiness was marked by stage I sleep. Hyperventilation was not performed due to pt contraindication of Droplet Precaution. Photic stimulation resulted in posterior driving responses at multiple frequencies, without any abnormal photo paroxysmal responses. EKG normal sinus rhythm.  RVEEG Technical Summary of Findings Impression and Interpretations: This is a normal routine video EEG. However a normal EEG does not exclude a diagnosis of epilepsy. Alric Ran, MD [facility]    ECHOCARDIOGRAM COMPLETE  Result Date: 05/10/2021    ECHOCARDIOGRAM REPORT   Patient Name:   Melissa Case Date of Exam: 05/10/2021 Medical Rec #:  [id number]      Height:       64.0 in Accession #:    [id number]     Weight:       178.0 lb Date of  Birth:  01/05/1961      BSA:          1.862 m Patient Age:    89 years       BP:           129/75 mmHg Patient Gender: F              HR:           76 bpm. Exam Location:  Inpatient Procedure: 2D Echo, 3D Echo, Cardiac Doppler and Color Doppler Indications:    I50.40* Unspecified combined systolic (congestive) and diastolic                 (congestive) heart failure  History:        Patient has no prior history of Echocardiogram examinations.                 Risk Factors:Dyslipidemia. Brain cancer. Seizure.  Sonographer:    Roseanna Rainbow RDCS Referring Phys: [place]  Sonographer Comments: Image acquisition challenging due to patient body habitus. IMPRESSIONS  1. Left ventricular ejection fraction, by estimation, is 55 to 60%. The left ventricle has normal function. The left ventricle has no regional wall motion abnormalities. There is mild concentric left ventricular hypertrophy. Left ventricular diastolic parameters are indeterminate.  2. Right ventricular systolic function is normal. The right ventricular size is normal. Tricuspid regurgitation signal is inadequate for assessing PA pressure.  3. The mitral valve is normal in structure. No evidence of mitral valve regurgitation. No evidence of mitral stenosis.  4. The aortic valve is tricuspid. Aortic valve regurgitation is not visualized. No aortic stenosis is present.  5. The inferior vena cava is normal in size with greater than 50% respiratory variability, suggesting right atrial pressure of 3 mmHg. Comparison(s): No prior Echocardiogram. Conclusion(s)/Recommendation(s): Normal biventricular function without evidence of hemodynamically significant valvular heart disease. FINDINGS  Left Ventricle: Left ventricular ejection fraction, by estimation, is 55 to 60%. The left ventricle has normal function. The left ventricle has no regional wall motion abnormalities. The left ventricular internal cavity size was normal in size. There is  mild concentric left  ventricular hypertrophy. Left ventricular diastolic parameters are indeterminate. Right Ventricle: The right ventricular size is normal. No increase in  SLEEP: Stage two sleep displayed Sleep Spindles and Vertex Waveform components. All other sleep stages appeared symmetrical and synchronous with regards to K-Complexes, and REM. EKG: Normal sinus rhythm. AVEEG Technical Summary of Findings Impression and Interpretations: This is a normal ambulatory video EEG. There were no events recorded. A normal EEG does not exclude a diagnosis of epilepsy. Alric Ran, MD [facility]    VAS [place] LOWER EXTREMITY VENOUS (DVT) (ONLY [facility])  Result Date: 05/10/2021  Lower Venous DVT Study Patient Name:  Melissa Case  Date of Exam:   05/10/2021 Medical Rec #: [id number]       Accession #:    [id number] Date of Birth: 11-May-1961       Patient Gender: F Patient Age:   60 years Exam Location:  [facility] Procedure:      VAS [place] LOWER EXTREMITY VENOUS (DVT) Referring Phys: Herbie Baltimore LOCKWOOD --------------------------------------------------------------------------------  Indications: Bilateral swelling for two weeks., and SOB.  Comparison Study: No priors Performing Technologist: Velva Harman Sturdivant RDMS, RVT  Examination Guidelines: A complete evaluation includes B-mode imaging, spectral Doppler, color Doppler, and power Doppler as needed of all accessible portions of each vessel. Bilateral testing is considered an integral part of a complete examination. Limited examinations for reoccurring indications may be performed as noted. The reflux portion of the exam is performed with the patient in reverse Trendelenburg.   +---------+---------------+---------+-----------+----------+-----------------+  RIGHT     Compressibility Phasicity Spontaneity Properties Thrombus Aging     +---------+---------------+---------+-----------+----------+-----------------+  CFV       Full            Yes       Yes                                       +---------+---------------+---------+-----------+----------+-----------------+  SFJ       Full                                                                +---------+---------------+---------+-----------+----------+-----------------+  FV Prox   Full                                                                +---------+---------------+---------+-----------+----------+-----------------+  FV Mid    Full                                                                +---------+---------------+---------+-----------+----------+-----------------+  FV Distal Full                                                                +---------+---------------+---------+-----------+----------+-----------------+  impedance under 10K ohms. Pt is awake, alert, cooperative and orientated Xs 4 of 4? correct. EEGT Attended Video Recording: Yes Hardware - (Trackit) by [place] Description: This RVEEG was obtained during awake and drowsy states, during wakefulness posterior dominant rhythm was 8 Hz, alpha, well-modulated, moderate in voltage, and attenuated appropriately with eye opening. During the recording there was no focal or lateralized epileptiform activity or focal slowing noted. Drowsiness was marked by stage I sleep. Hyperventilation was not performed due to pt contraindication of Droplet Precaution. Photic stimulation resulted in posterior driving responses at multiple frequencies, without any abnormal photo paroxysmal responses. EKG normal sinus rhythm.  RVEEG Technical Summary of Findings Impression and Interpretations: This is a normal routine video EEG. However a normal EEG does not exclude a diagnosis of epilepsy. Alric Ran, MD [facility]    ECHOCARDIOGRAM COMPLETE  Result Date: 05/10/2021    ECHOCARDIOGRAM REPORT   Patient Name:   Melissa Case Date of Exam: 05/10/2021 Medical Rec #:  [id number]      Height:       64.0 in Accession #:    [id number]     Weight:       178.0 lb Date of  Birth:  01/05/1961      BSA:          1.862 m Patient Age:    89 years       BP:           129/75 mmHg Patient Gender: F              HR:           76 bpm. Exam Location:  Inpatient Procedure: 2D Echo, 3D Echo, Cardiac Doppler and Color Doppler Indications:    I50.40* Unspecified combined systolic (congestive) and diastolic                 (congestive) heart failure  History:        Patient has no prior history of Echocardiogram examinations.                 Risk Factors:Dyslipidemia. Brain cancer. Seizure.  Sonographer:    Roseanna Rainbow RDCS Referring Phys: [place]  Sonographer Comments: Image acquisition challenging due to patient body habitus. IMPRESSIONS  1. Left ventricular ejection fraction, by estimation, is 55 to 60%. The left ventricle has normal function. The left ventricle has no regional wall motion abnormalities. There is mild concentric left ventricular hypertrophy. Left ventricular diastolic parameters are indeterminate.  2. Right ventricular systolic function is normal. The right ventricular size is normal. Tricuspid regurgitation signal is inadequate for assessing PA pressure.  3. The mitral valve is normal in structure. No evidence of mitral valve regurgitation. No evidence of mitral stenosis.  4. The aortic valve is tricuspid. Aortic valve regurgitation is not visualized. No aortic stenosis is present.  5. The inferior vena cava is normal in size with greater than 50% respiratory variability, suggesting right atrial pressure of 3 mmHg. Comparison(s): No prior Echocardiogram. Conclusion(s)/Recommendation(s): Normal biventricular function without evidence of hemodynamically significant valvular heart disease. FINDINGS  Left Ventricle: Left ventricular ejection fraction, by estimation, is 55 to 60%. The left ventricle has normal function. The left ventricle has no regional wall motion abnormalities. The left ventricular internal cavity size was normal in size. There is  mild concentric left  ventricular hypertrophy. Left ventricular diastolic parameters are indeterminate. Right Ventricle: The right ventricular size is normal. No increase in  SLEEP: Stage two sleep displayed Sleep Spindles and Vertex Waveform components. All other sleep stages appeared symmetrical and synchronous with regards to K-Complexes, and REM. EKG: Normal sinus rhythm. AVEEG Technical Summary of Findings Impression and Interpretations: This is a normal ambulatory video EEG. There were no events recorded. A normal EEG does not exclude a diagnosis of epilepsy. Alric Ran, MD [facility]    VAS [place] LOWER EXTREMITY VENOUS (DVT) (ONLY [facility])  Result Date: 05/10/2021  Lower Venous DVT Study Patient Name:  Melissa Case  Date of Exam:   05/10/2021 Medical Rec #: [id number]       Accession #:    [id number] Date of Birth: 11-May-1961       Patient Gender: F Patient Age:   60 years Exam Location:  [facility] Procedure:      VAS [place] LOWER EXTREMITY VENOUS (DVT) Referring Phys: Herbie Baltimore LOCKWOOD --------------------------------------------------------------------------------  Indications: Bilateral swelling for two weeks., and SOB.  Comparison Study: No priors Performing Technologist: Velva Harman Sturdivant RDMS, RVT  Examination Guidelines: A complete evaluation includes B-mode imaging, spectral Doppler, color Doppler, and power Doppler as needed of all accessible portions of each vessel. Bilateral testing is considered an integral part of a complete examination. Limited examinations for reoccurring indications may be performed as noted. The reflux portion of the exam is performed with the patient in reverse Trendelenburg.   +---------+---------------+---------+-----------+----------+-----------------+  RIGHT     Compressibility Phasicity Spontaneity Properties Thrombus Aging     +---------+---------------+---------+-----------+----------+-----------------+  CFV       Full            Yes       Yes                                       +---------+---------------+---------+-----------+----------+-----------------+  SFJ       Full                                                                +---------+---------------+---------+-----------+----------+-----------------+  FV Prox   Full                                                                +---------+---------------+---------+-----------+----------+-----------------+  FV Mid    Full                                                                +---------+---------------+---------+-----------+----------+-----------------+  FV Distal Full                                                                +---------+---------------+---------+-----------+----------+-----------------+  impedance under 10K ohms. Pt is awake, alert, cooperative and orientated Xs 4 of 4? correct. EEGT Attended Video Recording: Yes Hardware - (Trackit) by [place] Description: This RVEEG was obtained during awake and drowsy states, during wakefulness posterior dominant rhythm was 8 Hz, alpha, well-modulated, moderate in voltage, and attenuated appropriately with eye opening. During the recording there was no focal or lateralized epileptiform activity or focal slowing noted. Drowsiness was marked by stage I sleep. Hyperventilation was not performed due to pt contraindication of Droplet Precaution. Photic stimulation resulted in posterior driving responses at multiple frequencies, without any abnormal photo paroxysmal responses. EKG normal sinus rhythm.  RVEEG Technical Summary of Findings Impression and Interpretations: This is a normal routine video EEG. However a normal EEG does not exclude a diagnosis of epilepsy. Alric Ran, MD [facility]    ECHOCARDIOGRAM COMPLETE  Result Date: 05/10/2021    ECHOCARDIOGRAM REPORT   Patient Name:   Melissa Case Date of Exam: 05/10/2021 Medical Rec #:  [id number]      Height:       64.0 in Accession #:    [id number]     Weight:       178.0 lb Date of  Birth:  01/05/1961      BSA:          1.862 m Patient Age:    89 years       BP:           129/75 mmHg Patient Gender: F              HR:           76 bpm. Exam Location:  Inpatient Procedure: 2D Echo, 3D Echo, Cardiac Doppler and Color Doppler Indications:    I50.40* Unspecified combined systolic (congestive) and diastolic                 (congestive) heart failure  History:        Patient has no prior history of Echocardiogram examinations.                 Risk Factors:Dyslipidemia. Brain cancer. Seizure.  Sonographer:    Roseanna Rainbow RDCS Referring Phys: [place]  Sonographer Comments: Image acquisition challenging due to patient body habitus. IMPRESSIONS  1. Left ventricular ejection fraction, by estimation, is 55 to 60%. The left ventricle has normal function. The left ventricle has no regional wall motion abnormalities. There is mild concentric left ventricular hypertrophy. Left ventricular diastolic parameters are indeterminate.  2. Right ventricular systolic function is normal. The right ventricular size is normal. Tricuspid regurgitation signal is inadequate for assessing PA pressure.  3. The mitral valve is normal in structure. No evidence of mitral valve regurgitation. No evidence of mitral stenosis.  4. The aortic valve is tricuspid. Aortic valve regurgitation is not visualized. No aortic stenosis is present.  5. The inferior vena cava is normal in size with greater than 50% respiratory variability, suggesting right atrial pressure of 3 mmHg. Comparison(s): No prior Echocardiogram. Conclusion(s)/Recommendation(s): Normal biventricular function without evidence of hemodynamically significant valvular heart disease. FINDINGS  Left Ventricle: Left ventricular ejection fraction, by estimation, is 55 to 60%. The left ventricle has normal function. The left ventricle has no regional wall motion abnormalities. The left ventricular internal cavity size was normal in size. There is  mild concentric left  ventricular hypertrophy. Left ventricular diastolic parameters are indeterminate. Right Ventricle: The right ventricular size is normal. No increase in  impedance under 10K ohms. Pt is awake, alert, cooperative and orientated Xs 4 of 4? correct. EEGT Attended Video Recording: Yes Hardware - (Trackit) by [place] Description: This RVEEG was obtained during awake and drowsy states, during wakefulness posterior dominant rhythm was 8 Hz, alpha, well-modulated, moderate in voltage, and attenuated appropriately with eye opening. During the recording there was no focal or lateralized epileptiform activity or focal slowing noted. Drowsiness was marked by stage I sleep. Hyperventilation was not performed due to pt contraindication of Droplet Precaution. Photic stimulation resulted in posterior driving responses at multiple frequencies, without any abnormal photo paroxysmal responses. EKG normal sinus rhythm.  RVEEG Technical Summary of Findings Impression and Interpretations: This is a normal routine video EEG. However a normal EEG does not exclude a diagnosis of epilepsy. Alric Ran, MD [facility]    ECHOCARDIOGRAM COMPLETE  Result Date: 05/10/2021    ECHOCARDIOGRAM REPORT   Patient Name:   Melissa Case Date of Exam: 05/10/2021 Medical Rec #:  [id number]      Height:       64.0 in Accession #:    [id number]     Weight:       178.0 lb Date of  Birth:  01/05/1961      BSA:          1.862 m Patient Age:    89 years       BP:           129/75 mmHg Patient Gender: F              HR:           76 bpm. Exam Location:  Inpatient Procedure: 2D Echo, 3D Echo, Cardiac Doppler and Color Doppler Indications:    I50.40* Unspecified combined systolic (congestive) and diastolic                 (congestive) heart failure  History:        Patient has no prior history of Echocardiogram examinations.                 Risk Factors:Dyslipidemia. Brain cancer. Seizure.  Sonographer:    Roseanna Rainbow RDCS Referring Phys: [place]  Sonographer Comments: Image acquisition challenging due to patient body habitus. IMPRESSIONS  1. Left ventricular ejection fraction, by estimation, is 55 to 60%. The left ventricle has normal function. The left ventricle has no regional wall motion abnormalities. There is mild concentric left ventricular hypertrophy. Left ventricular diastolic parameters are indeterminate.  2. Right ventricular systolic function is normal. The right ventricular size is normal. Tricuspid regurgitation signal is inadequate for assessing PA pressure.  3. The mitral valve is normal in structure. No evidence of mitral valve regurgitation. No evidence of mitral stenosis.  4. The aortic valve is tricuspid. Aortic valve regurgitation is not visualized. No aortic stenosis is present.  5. The inferior vena cava is normal in size with greater than 50% respiratory variability, suggesting right atrial pressure of 3 mmHg. Comparison(s): No prior Echocardiogram. Conclusion(s)/Recommendation(s): Normal biventricular function without evidence of hemodynamically significant valvular heart disease. FINDINGS  Left Ventricle: Left ventricular ejection fraction, by estimation, is 55 to 60%. The left ventricle has normal function. The left ventricle has no regional wall motion abnormalities. The left ventricular internal cavity size was normal in size. There is  mild concentric left  ventricular hypertrophy. Left ventricular diastolic parameters are indeterminate. Right Ventricle: The right ventricular size is normal. No increase in  SLEEP: Stage two sleep displayed Sleep Spindles and Vertex Waveform components. All other sleep stages appeared symmetrical and synchronous with regards to K-Complexes, and REM. EKG: Normal sinus rhythm. AVEEG Technical Summary of Findings Impression and Interpretations: This is a normal ambulatory video EEG. There were no events recorded. A normal EEG does not exclude a diagnosis of epilepsy. Alric Ran, MD [facility]    VAS [place] LOWER EXTREMITY VENOUS (DVT) (ONLY [facility])  Result Date: 05/10/2021  Lower Venous DVT Study Patient Name:  Melissa Case  Date of Exam:   05/10/2021 Medical Rec #: [id number]       Accession #:    [id number] Date of Birth: 11-May-1961       Patient Gender: F Patient Age:   60 years Exam Location:  [facility] Procedure:      VAS [place] LOWER EXTREMITY VENOUS (DVT) Referring Phys: Herbie Baltimore LOCKWOOD --------------------------------------------------------------------------------  Indications: Bilateral swelling for two weeks., and SOB.  Comparison Study: No priors Performing Technologist: Velva Harman Sturdivant RDMS, RVT  Examination Guidelines: A complete evaluation includes B-mode imaging, spectral Doppler, color Doppler, and power Doppler as needed of all accessible portions of each vessel. Bilateral testing is considered an integral part of a complete examination. Limited examinations for reoccurring indications may be performed as noted. The reflux portion of the exam is performed with the patient in reverse Trendelenburg.   +---------+---------------+---------+-----------+----------+-----------------+  RIGHT     Compressibility Phasicity Spontaneity Properties Thrombus Aging     +---------+---------------+---------+-----------+----------+-----------------+  CFV       Full            Yes       Yes                                       +---------+---------------+---------+-----------+----------+-----------------+  SFJ       Full                                                                +---------+---------------+---------+-----------+----------+-----------------+  FV Prox   Full                                                                +---------+---------------+---------+-----------+----------+-----------------+  FV Mid    Full                                                                +---------+---------------+---------+-----------+----------+-----------------+  FV Distal Full                                                                +---------+---------------+---------+-----------+----------+-----------------+  SLEEP: Stage two sleep displayed Sleep Spindles and Vertex Waveform components. All other sleep stages appeared symmetrical and synchronous with regards to K-Complexes, and REM. EKG: Normal sinus rhythm. AVEEG Technical Summary of Findings Impression and Interpretations: This is a normal ambulatory video EEG. There were no events recorded. A normal EEG does not exclude a diagnosis of epilepsy. Alric Ran, MD [facility]    VAS [place] LOWER EXTREMITY VENOUS (DVT) (ONLY [facility])  Result Date: 05/10/2021  Lower Venous DVT Study Patient Name:  Melissa Case  Date of Exam:   05/10/2021 Medical Rec #: [id number]       Accession #:    [id number] Date of Birth: 11-May-1961       Patient Gender: F Patient Age:   60 years Exam Location:  [facility] Procedure:      VAS [place] LOWER EXTREMITY VENOUS (DVT) Referring Phys: Herbie Baltimore LOCKWOOD --------------------------------------------------------------------------------  Indications: Bilateral swelling for two weeks., and SOB.  Comparison Study: No priors Performing Technologist: Velva Harman Sturdivant RDMS, RVT  Examination Guidelines: A complete evaluation includes B-mode imaging, spectral Doppler, color Doppler, and power Doppler as needed of all accessible portions of each vessel. Bilateral testing is considered an integral part of a complete examination. Limited examinations for reoccurring indications may be performed as noted. The reflux portion of the exam is performed with the patient in reverse Trendelenburg.   +---------+---------------+---------+-----------+----------+-----------------+  RIGHT     Compressibility Phasicity Spontaneity Properties Thrombus Aging     +---------+---------------+---------+-----------+----------+-----------------+  CFV       Full            Yes       Yes                                       +---------+---------------+---------+-----------+----------+-----------------+  SFJ       Full                                                                +---------+---------------+---------+-----------+----------+-----------------+  FV Prox   Full                                                                +---------+---------------+---------+-----------+----------+-----------------+  FV Mid    Full                                                                +---------+---------------+---------+-----------+----------+-----------------+  FV Distal Full                                                                +---------+---------------+---------+-----------+----------+-----------------+

## 2021-05-10 NOTE — Progress Notes (Signed)
Ultrasound to both arms Posterior and Anterior below A/C and above - IV access provided on Rt arm above A/C to only vein visualized for CT with contrast - labs drawn at this time.

## 2021-05-10 NOTE — Progress Notes (Signed)
TRANSITION OF CARE VISIT   Primary Care Provider (PCP):     Durene Fruits, NP                                                       [facility] at [facility] [address] [place],  [place]  [address] Phone: [phone] Fax: [phone]     Date of Admission: 05/10/2021  Date of Discharge: 05/14/2021  Transitions of Care Call: 05/17/2021  Discharged from: [facility]   Discharge Diagnosis:  Acute hypoxic respiratory failure 2/2 bilateral PEs without right heart strain Anemia, likely of chronic disease   Summary of Admission per MD note:  Acute hypoxic respiratory failure 2/2 PE   bilateral DVTs Patient presented to ED with shortness of breath and bilateral lower extremity edema x2 weeks.  CXR noted mild, diffuse bilateral interstitial pulmonary opacity, likely edema.  CTA noted significantly bilateral pulmonary emboli and additionally a rounded pleural-based density in the posterior right lower lobe which may represent an early pulmonary infarct.  Echo notably showed LVEF 55 to 60% without regional wall motion abnormalities, mild concentric LVH, no evidence of right heart strain.  Patient received 1 dose of Eliquis in the ED.  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 Patient was transitioned to Eliquis 10 mg twice daily x7 days (start 12/22) and and will transition to then Eliquis 5 mg twice daily on 12/29.  Patient worked with physical therapy and Occupational Therapy who initially recommended SNF but on reevaluation home PT.  Patient did have oxygen requirement at discharge so was set up with home O2.   Anemia   Thrombocytopenia Patient with chronic anemia had acute decrease in hemoglobin that trended stable. Mild thrombocytopenia noted upon admission and trended  stable. Upon discharge, Hgb 8.7, platelets 152.   Other chronic conditions were medically managed with home medications and formulary alternatives as necessary (GERD, panic attacks, seizure history)   PCP Follow-up Recommendations: Repeat CBC outpatient to monitor Hgb and platelets Ensure patient is working with home PT Patient should transition to Eliquis 5 mg twice daily on 12/29   Follow-Ups: Follow up with Care, [facility]); the office will call next week to schedule home health visits  TODAY's VISIT Reports still having bilateral lower extremity and feet edema. Having numbness and tingling of toes. Was improved during hospitalization but since discharge starting to increase. Wearing compression stockings and elevating extremities when able to do so. Recently had initial evaluation at [facility] PT. Reports they will be coming out to the home at least twice weekly for the first 2 weeks. They have demonstrated for her exercises to complete from her wheelchair and encouraged her to ambulate every 2 hours, endorses compliance. Denies chest pain. On 4 LPM oxygen. 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 Had a Wound Care appointment earlier today and to return in 3 weeks. 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 Taking Eliquis twice daily.    MEDICATIONS  Medication Reconciliation conducted with patient/caregiver? (Yes/ No): Yes   New medications  prescribed/discontinued upon discharge? (Yes/No): Yes  Barriers identified related to medications: No  LABS  Lab Reviewed (Yes/No/NA): Yes   PHYSICAL EXAM:  Physical Exam HENT:     Head: Normocephalic and atraumatic.  Eyes:     Extraocular Movements: Extraocular movements intact.     Conjunctiva/sclera: Conjunctivae normal.     Pupils: Pupils are equal, round, and  reactive to light.  Cardiovascular:     Rate and Rhythm: Normal rate and regular rhythm.     Pulses: Normal pulses.     Heart sounds: Normal heart sounds.  Pulmonary:     Effort: Pulmonary effort is normal.     Breath sounds: Normal breath sounds.  Musculoskeletal:     Cervical back: Normal range of motion and neck supple.     Right lower leg: 1+ Edema present.     Left lower leg: 1+ Edema present.     Right foot: Swelling present.     Left foot: Swelling present.  Neurological:     General: No focal deficit present.     Mental Status: She is alert and oriented to person, place, and time.  Psychiatric:        Mood and Affect: Mood normal.        Behavior: Behavior normal.    ASSESSMENT & PLAN: 1. Hospital discharge follow-up: - Reviewed hospital course, current medications, ensured proper follow-up in place, and addressed concerns.   2. Acute pulmonary embolism, unspecified pulmonary embolism type, unspecified whether acute cor pulmonale present ([place]): - Continue Apixaban as prescribed.  - Referral to Pulmonology for further evaluation and management.  - Update CBC today.  - Ambulatory referral to Pulmonology - CBC - apixaban (ELIQUIS) 5 MG TABS tablet; Take 1 tablet (5 mg total) by mouth 2 (two) times daily.  Dispense: 60 tablet; Refill: 0  3. Deep vein thrombosis (DVT) of proximal lower extremity, unspecified chronicity, unspecified laterality ([place]): - Continue Apixaban as prescribed.  - Begin short course of Furosemide as prescribed.  - Referral to Vascular Surgery for further evaluation and management.  - Update CBC today.  - CBC - Ambulatory referral to Vascular Surgery - furosemide (LASIX) 20 MG tablet; Take 1 tablet (20 mg total) by mouth daily for 3 days.  Dispense: 3 tablet; Refill: 0 - apixaban (ELIQUIS) 5 MG TABS tablet; Take 1 tablet (5 mg total) by mouth 2 (two) times daily.  Dispense: 60 tablet; Refill: 0  4. Mass of temporal lobe: 5. History of radiation to  head and neck region: - Keep all scheduled appointments with Neurology and Neurosurgery.    PATIENT EDUCATION PROVIDED: See AVS   FOLLOW-UP (Include any further testing or referrals):  - Referral to Pulmonology.  - Referral to Vascular Surgery. - Keep all scheduled appointments with Neurology and Neurosurgery.

## 2021-05-10 NOTE — Progress Notes (Signed)
FPTS Brief Progress Note  S: Melissa Case was seen this p.m., she had DOE after coming back from the bedside commode.  She decided to 88% on 2 L nasal cannula.   O: BP (!) 105/57 (BP Location: Right Arm)    Pulse 93    Temp 98.8 F (37.1 C) (Oral)    Resp 17    Ht 5\' 4"  (1.626 m)    SpO2 96%    BMI 30.55 kg/m   Physical Exam Vitals and nursing note reviewed.  Constitutional:      General: She is awake. She is not in acute distress.    Appearance: She is ill-appearing. She is not toxic-appearing or diaphoretic.  Cardiovascular:     Rate and Rhythm: Regular rhythm. Tachycardia present.     Heart sounds: No murmur heard.   No friction rub. No gallop.  Pulmonary:     Effort: Tachypnea present.     Breath sounds: Examination of the right-middle field reveals decreased breath sounds. Examination of the left-middle field reveals decreased breath sounds. Examination of the right-lower field reveals decreased breath sounds. Examination of the left-lower field reveals decreased breath sounds. Decreased breath sounds present. No wheezing, rhonchi or rales.     Comments: Nasal cannula in place, 2 L Skin:    General: Skin is warm and dry.  Neurological:     Mental Status: She is alert.     A/P: Acute hypoxemic respiratory failure 2/2 bilateral PE, SpO2 96 % on 2L [place].  We will continue to monitor, if respiratory status worsens will call PCCM Heparin per pharmacy Neurosurgery tomorrow Decadron 4 mg q12hr Pending noncon head CT Orders reviewed. Labs for AM ordered, which was adjusted as needed.   Remainder of plan per day team/daily progress note.  Melissa Brittle, DO 05/10/2021, 8:38 PM PGY-1, [facility] Night Resident  Please page [phone] with questions.

## 2021-05-10 NOTE — Progress Notes (Addendum)
ANTICOAGULATION CONSULT NOTE - Initial Consult  Pharmacy Consult for heparin Indication: pulmonary embolus  Allergies  Allergen Reactions   Morphine Itching   Penicillins Rash    Has patient had a PCN reaction causing immediate rash, facial/tongue/throat swelling, SOB or lightheadedness with hypotension: No Has patient had a PCN reaction causing severe rash involving mucus membranes or skin necrosis: No Has patient had a PCN reaction that required hospitalization No Has patient had a PCN reaction occurring within the last 10 years: No If all of the above answers are "NO", then may proceed with Cephalosporin use.     Patient Measurements: Height: 5\' 4"  (162.6 cm) IBW/kg (Calculated) : 54.7 Heparin Dosing Weight: 72 kg  Vital Signs: Temp: 96.5 F (35.8 C) (12/20 1810) Temp Source: Axillary (12/20 1810) BP: 115/74 (12/20 1530) Pulse Rate: 88 (12/20 1810)  Labs: Recent Labs    05/10/21 1030 05/10/21 1500  HGB 11.8*  --   HCT 36.7  --   PLT 152  --   CREATININE 0.54  --   TROPONINIHS 11 14    Estimated Creatinine Clearance: 76.9 mL/min (by C-G formula based on SCr of 0.54 mg/dL).   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 Noted hx of parotid cancer. No anticoagulation prior to admission. Pharmacy consulted for heparin.    Goal of Therapy:  Heparin level 0.3-0.7 units/ml Monitor platelets by anticoagulation protocol: Yes   Plan:  Heparin 4000 unit bolus x1 then 1200 units/hr F/u 6hr HL  Monitor daily HL, CBC/plt Monitor for signs/symptoms of bleeding  F/u long term anticoagulation plan    ADDENDUM: patient received apixaban at 1600. Will start heparin at 4am and f/u level.    Benetta Spar, PharmD, BCPS,  [facility] Clinical Pharmacist  Please check AMION for all [place] phone numbers After 10:00 PM, call [place] [phone]

## 2021-05-10 NOTE — H&P (Addendum)
[facility] Admission History and Physical Service Pager: [phone]  Patient name: Melissa Case Medical record number: [id number] Date of birth: [birth year] Age: 60 y.o. Gender: female  Primary Care Provider: Camillia Herter, NP Consultants: None Code Status: Full Preferred Emergency Contact:  Contact Information     Name Relation Home Work Mobile   Davlin,Richard Spouse [phone]  [phone]   Deleon,Jolene Sister   [phone]      Chief Complaint: Shortness of breath in lower extremity swelling  Assessment and Plan: Leith Hedlund is a 60 y.o. female presenting with new oxygen requirement in the setting of acute/subacute bilateral DVTs . PMH is significant for parotid cancer s/p radiation and resection, GERD, panic attacks, seizures.  New oxygen requirement, suspicion for PE   BLE DVT   parotid cancer s/p radiation and parotidectomy Pt presented with shortness of breath and bilateral lower extremity edema x 2 weeks. She does not have baseline oxygen requirement but is requiring 2L [place] upon admission. She tends to be very fatigued due to her antiseizure medications. Pt takes steroids chronically for brain edema. On presentation, troponin flat x 1, BNP wnl. Vascular U/S for LE revealed bilateral DVTs.  CXR noted mild, diffuse bilateral interstitial pulmonary opacity, likely edema.  Patient did appear to present as CHF picture and therefore received single dose of Lasix.  Echo notably showed LVEF 55 to 60% without regional wall motion abnormalities, mild concentric LVH, no evidence of right heart strain. CTA noted significant bilateral pulmonary emboli and additionally a rounded pleural-based density in the posterior right lower lobe which may represent an early pulmonary infarct.  Patient received 1 dose of Eliquis in the ED.  Treatment plan is complicated by brain radiation scar which may be susceptible to increased bleeding risk on on anticoagulation.  Will  transition to heparin drip at this time to have better control over the anticoagulation.  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  Will require assistance from neurosurgery and perhaps pulmonology in the near future. -Admitted to [facility], attending Dr. Owens Shark, med telemetry unit -Consult neurosurgery tomorrow a.m. after CT results -If worsening respiratory status consider pulmonology consult -Indefinite continuous cardiac and pulse ox monitoring -Vital signs per unit -Fall precautions, up with assistance -PT/OT eval and treat -CT head without contrast, patient will likely need repeat CT head without contrast prior to discharge -Discontinue Eliquis -Heparin per pharmacy -Rest panel by RT-PCR flu/COVID -Continue Decadron 4 mg every 12 hours  Anemia Chart review notes worsening anemia in the last couple months with notable progressively increasing MCV. Will explore macrocytic anemia causes.  Hemoglobin of 11.8 and MCV 102.8 at this time.  May also be due to cancerous process. -B12, Folate -Daily CBC  Seizures History of seizures. Home medications include Depakote 750mg  BID and Keppra 500mg  BID. -Continue Depakote 750mg  BID -Continue Keppra 500mg  BID  GERD Home medication include Nexium 40mg  daily. -Protonix 40 mg daily  Panic attacks Home medications include Paxil 40mg  daily. -Continue Paxil 40 mg daily  FEN/GI: Regular Prophylaxis: Heparin drip  Disposition: Med telemetry  History of Present Illness:  Melissa Case is a 60 y.o. female presenting with 2 weeks of worsening shortness of breath and lower extremity swelling.  Patient states she has experienced these previously.  She went to the urgent care today and was sent to the ED for this concern.  She denies any pain at  this time.  Does note that she is sometimes congested but overall did not have any other concerns  at this time.  Patient reports that she had a viral illness approximately 1 month ago and since has been lying around and more mobile.  She was brought to the emergency department during that illness and received steroids.  She tested negative for COVID as well as the flu.  Patient admits to smoking for years in the past however quit 4 weeks ago.  She does endorse starting vaping since quitting.  Denies alcohol or illicit drug use.  Review Of Systems: Per HPI with the following additions:   Review of Systems  Constitutional:  Positive for fatigue.  HENT:  Negative for congestion.   Respiratory:  Positive for shortness of breath.   Cardiovascular:  Positive for leg swelling. Negative for chest pain.  Gastrointestinal:  Negative for abdominal distention and abdominal pain.  Neurological:  Negative for seizures and weakness.    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   Lesion of parotid gland 07/14/2019   Acute calculous cholecystitis 11/21/2017   Gastro-esophageal reflux disease without esophagitis 02/03/2014   Abdominal pain 09/30/2013   SBO (small bowel obstruction) ([place]) 09/30/2013   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/05/2012   Gall stone 04/05/2012   Barrett's esophagus 04/05/2012   Cholelithiasis 03/27/2012   Hyperlipidemia 03/25/2012   Depression 09/15/2011   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    Past Medical History: Past Medical History:  Diagnosis Date   Cancer ([place])    Partoid   Depression    GERD (gastroesophageal reflux disease)    Panic attack    Seizures ([place])     Past Surgical History: Past Surgical History:  Procedure Laterality Date   ABDOMINAL HYSTERECTOMY     CHOLECYSTECTOMY N/A 11/21/2017   Procedure:  LAPAROSCOPIC CHOLECYSTECTOMY WITH INTRAOPERATIVE CHOLANGIOGRAM;  Surgeon: Jovita Kussmaul, MD;  Location: [facility];  Service: General;  Laterality: N/A;   MULTIPLE EXTRACTIONS WITH ALVEOLOPLASTY N/A 08/28/2019   Procedure: Extraction of tooth #'s 3-7, 10-15, and 20-31 with alveoloplatsy, maxillary right buccal exostosis reduction, and bilateral mandibular tori reductions.;  Surgeon: Lenn Cal, DDS;  Location: [facility];  Service: Oral Surgery;  Laterality: N/A;   PAROTIDECTOMY Right 07/22/2019   with biopsy; done at [facility] [place] by Dr. Fredricka Bonine    Social History: Social History   Tobacco Use   Smoking status: Former    Packs/day: 0.50    Years: 42.00    Pack years: 21.00    Types: Cigarettes    Start date: 1978    Quit date: 01/16/2021    Years since quitting: 0.3   Smokeless tobacco: Never  Vaping Use   Vaping Use: Every day  Substance Use Topics   Alcohol use: No    Alcohol/week: 0.0 standard drinks   Drug use: No   Additional social history: Quit smoking 4 weeks ago but has been vaping since then.  Denies alcohol or illicit drug use.  Family History: Family History  Problem Relation Age of Onset   Depression Sister    Heart disease Mother    Diabetes Father    Breast cancer Paternal Grandmother    Colon cancer Neg Hx  Pancreatic cancer Neg Hx    Esophageal cancer Neg Hx    Stomach cancer Neg Hx    Rectal cancer Neg Hx    Liver cancer Neg Hx     Allergies and Medications: Allergies  Allergen Reactions   Morphine Itching   Penicillins Rash    Has patient had a PCN reaction causing immediate rash, facial/tongue/throat swelling, SOB or lightheadedness with hypotension: No Has patient had a PCN reaction causing severe rash involving mucus membranes or skin necrosis: No Has patient had a PCN reaction that required hospitalization No Has patient had a PCN reaction occurring within the last 10 years: No If all of the above answers are "NO", then may proceed with  Cephalosporin use.    No current facility-administered medications on file prior to encounter.   Current Outpatient Medications on File Prior to Encounter  Medication Sig Dispense Refill   dexamethasone (DECADRON) 4 MG tablet Take 1 tablet (4 mg total) by mouth every 6 (six) hours. 120 tablet 0   divalproex (DEPAKOTE) 250 MG DR tablet Take 1 tablet (250 mg total) by mouth 2 (two) times daily. (In addition to 500mg  twice daily for a total dosage of 750mg  BID). 60 tablet 3   divalproex (DEPAKOTE) 500 MG DR tablet Take 1 tablet (500 mg total) by mouth 2 (two) times daily. 60 tablet 3   esomeprazole (NEXIUM) 40 MG capsule Take 1 capsule by mouth once daily 30 capsule 0   levETIRAcetam (KEPPRA) 500 MG tablet Take 1 tablet (500 mg total) by mouth 2 (two) times daily. 60 tablet 3   PARoxetine (PAXIL) 40 MG tablet Take 1 tablet (40 mg total) by mouth daily. 90 tablet 1   mupirocin cream (BACTROBAN) 2 % Apply 1 application topically 2 (two) times daily. (Patient not taking: Reported on 05/10/2021) 30 g 1   ondansetron (ZOFRAN-ODT) 4 MG disintegrating tablet Take 1 tablet (4 mg total) by mouth every 8 (eight) hours as needed. (Patient not taking: Reported on 05/10/2021) 10 tablet 0    Objective: BP 115/74    Pulse 92    Temp (!) 97.4 F (36.3 C)    Resp (!) 21    SpO2 99%  Exam: General: A&O x3, NAD Eyes: EOMI ENTM: Deferred Neck: ROM WNL, abnormal appearance 2/2 parotidectomy Cardiovascular: RRR, no murmurs auscultated Respiratory: CTA B, [place] in place, minimal increased work of breathing Gastrointestinal: Soft, nontender, normoactive bowel sounds MSK: Strength equal and appropriate in BUEs, poor strength in BLE's at baseline Derm: +3 pitting edema of BLEs Neuro: No focal neurologic deficits appreciated, Psych: Normal mood and behavior  Labs and Imaging: CBC BMET  Recent Labs  Lab 05/10/21 1030  WBC 6.0  HGB 11.8*  HCT 36.7  PLT 152   Recent Labs  Lab 05/10/21 1030  NA 133*  K 3.6   CL 97*  CO2 23  BUN 13  CREATININE 0.54  GLUCOSE 148*  CALCIUM 8.4*     EKG: NSR, left axis deviation, normal PR and QRS interval, no ST elevations  DG Chest 2 View  Result Date: 05/10/2021 CLINICAL DATA:  Shortness of breath EXAM: CHEST - 2 VIEW COMPARISON:  04/20/2021 FINDINGS: The heart size and mediastinal contours are within normal limits. Mild, diffuse bilateral interstitial pulmonary opacity. Disc degenerative disease of the thoracic spine. IMPRESSION: Mild, diffuse bilateral interstitial pulmonary opacity, likely edema. No focal airspace opacity. Electronically Signed   By: Delanna Ahmadi M.D.   On: 05/10/2021 10:57   CT Angio Chest  PE W and/or Wo Contrast  Result Date: 05/10/2021 CLINICAL DATA:  Chest pain and increasing oxygen requirement, initial encounter EXAM: CT ANGIOGRAPHY CHEST WITH CONTRAST TECHNIQUE: Multidetector CT imaging of the chest was performed using the standard protocol during bolus administration of intravenous contrast. Multiplanar CT image reconstructions and MIPs were obtained to evaluate the vascular anatomy. CONTRAST:  8mL OMNIPAQUE IOHEXOL 350 MG/ML SOLN COMPARISON:  Chest x-ray from earlier in the same day, 11/18/2020. FINDINGS: Cardiovascular: Thoracic aorta demonstrates atherosclerotic calcifications without aneurysmal dilatation or dissection. No cardiac enlargement is seen. The pulmonary artery is well visualized within normal branching pattern bilaterally. Bilateral filling defects are identified consistent with pulmonary emboli slightly greater on the right than the left. No central saddle component is noted. No findings to suggest right heart strain are noted. Coronary calcifications are noted. No significant pericardial effusion is seen. Mediastinum/Nodes: Thoracic inlet demonstrates a prominent partially calcified nodule within the right lobe of thyroid. This measures up to 3.1 cm and is stable from a prior CT from 11/18/2020. Additionally a recent  PET-CT from 10/29/2020 showed no hypermetabolic activity in this region scattered small mediastinal lymph nodes are seen. None of these are significant by size criteria. The esophagus as visualized is within normal limits. Lungs/Pleura: Lungs are well aerated bilaterally. Some mosaic attenuation is noted consistent with air trapping. Patchy ground-glass opacities are noted in the upper lobes bilaterally new from prior examination and likely related to the underlying pulmonary emboli. A small pleural based rounded density is noted in the right lower lobe which may represent early changes of pulmonary infarction. Upper Abdomen: Visualized upper abdomen is within normal limits. Musculoskeletal: Degenerative changes of the thoracic spine are noted. No rib abnormality is seen. Review of the MIP images confirms the above findings. IMPRESSION: Significant bilateral pulmonary emboli without definitive right heart strain. Patchy airspace opacities are noted primarily in the upper lobes bilaterally likely related to pulmonary emboli. Additionally a rounded pleural based density is noted in the posterior right lower lobe which may represent an early pulmonary infarct. Prominent thyroid nodule measuring up to 3.1 cm. This is stable in appearance from a prior CT examination and showed no significant hypermetabolic activity on recent PET-CT from 10/29/2020. Additionally these changes were seen on prior ultrasound from 03/26/2015. Recommend nonemergent follow-up thyroid US (Ref: J Am Coll Radiol. 2015 Feb;12(2): 143-50). Aortic Atherosclerosis (ICD10-I70.0). Critical Value/emergent results were called by telephone at the time of interpretation on 05/10/2021 at 5:00 pm to Dr. Caron Presume , who verbally acknowledged these results. Electronically Signed   By: Inez Catalina M.D.   On: 05/10/2021 17:04   ECHOCARDIOGRAM COMPLETE  Result Date: 05/10/2021    ECHOCARDIOGRAM REPORT   Patient Name:   CHARNEL GILES Date of Exam: 05/10/2021  Medical Rec #:  [id number]      Height:       64.0 in Accession #:    [id number]     Weight:       178.0 lb Date of Birth:  1960/11/24      BSA:          1.862 m Patient Age:    12 years       BP:           129/75 mmHg Patient Gender: F              HR:           76 bpm. Exam Location:  Inpatient Procedure: 2D Echo, 3D Echo, Cardiac Doppler and Color  Doppler Indications:    I50.40* Unspecified combined systolic (congestive) and diastolic                 (congestive) heart failure  History:        Patient has no prior history of Echocardiogram examinations.                 Risk Factors:Dyslipidemia. Brain cancer. Seizure.  Sonographer:    Roseanna Rainbow RDCS Referring Phys: [place]  Sonographer Comments: Image acquisition challenging due to patient body habitus. IMPRESSIONS  1. Left ventricular ejection fraction, by estimation, is 55 to 60%. The left ventricle has normal function. The left ventricle has no regional wall motion abnormalities. There is mild concentric left ventricular hypertrophy. Left ventricular diastolic parameters are indeterminate.  2. Right ventricular systolic function is normal. The right ventricular size is normal. Tricuspid regurgitation signal is inadequate for assessing PA pressure.  3. The mitral valve is normal in structure. No evidence of mitral valve regurgitation. No evidence of mitral stenosis.  4. The aortic valve is tricuspid. Aortic valve regurgitation is not visualized. No aortic stenosis is present.  5. The inferior vena cava is normal in size with greater than 50% respiratory variability, suggesting right atrial pressure of 3 mmHg. Comparison(s): No prior Echocardiogram. Conclusion(s)/Recommendation(s): Normal biventricular function without evidence of hemodynamically significant valvular heart disease. FINDINGS  Left Ventricle: Left ventricular ejection fraction, by estimation, is 55 to 60%. The left ventricle has normal function. The left ventricle has no regional wall  motion abnormalities. The left ventricular internal cavity size was normal in size. There is  mild concentric left ventricular hypertrophy. Left ventricular diastolic parameters are indeterminate. Right Ventricle: The right ventricular size is normal. No increase in right ventricular wall thickness. Right ventricular systolic function is normal. Tricuspid regurgitation signal is inadequate for assessing PA pressure. Left Atrium: Left atrial size was normal in size. Right Atrium: Right atrial size was normal in size. Pericardium: There is no evidence of pericardial effusion. Mitral Valve: The mitral valve is normal in structure. No evidence of mitral valve regurgitation. No evidence of mitral valve stenosis. Tricuspid Valve: The tricuspid valve is normal in structure. Tricuspid valve regurgitation is trivial. No evidence of tricuspid stenosis. Aortic Valve: The aortic valve is tricuspid. Aortic valve regurgitation is not visualized. No aortic stenosis is present. Pulmonic Valve: The pulmonic valve was grossly normal. Pulmonic valve regurgitation is trivial. No evidence of pulmonic stenosis. Aorta: The aortic root, ascending aorta, aortic arch and descending aorta are all structurally normal, with no evidence of dilitation or obstruction. Venous: The inferior vena cava is normal in size with greater than 50% respiratory variability, suggesting right atrial pressure of 3 mmHg. IAS/Shunts: The atrial septum is grossly normal.  LEFT VENTRICLE PLAX 2D LVIDd:         3.80 cm     Diastology LVIDs:         2.60 cm     LV e' medial:    4.03 cm/s LV PW:         1.40 cm     LV E/e' medial:  15.8 LV IVS:        1.40 cm     LV e' lateral:   5.66 cm/s LVOT diam:     2.30 cm     LV E/e' lateral: 11.3 LV SV:         88 LV SV Index:   48 LVOT Area:     4.15 cm  3D Volume EF: LV Volumes (MOD)           3D EF:        57 % LV vol d, MOD A2C: 45.1 ml LV EDV:       132 ml LV vol d, MOD A4C: 45.0 ml LV ESV:        56 ml LV vol s, MOD A2C: 15.8 ml LV SV:        75 ml LV vol s, MOD A4C: 16.5 ml LV SV MOD A2C:     29.3 ml LV SV MOD A4C:     45.0 ml LV SV MOD BP:      28.4 ml RIGHT VENTRICLE             IVC RV S prime:     10.00 cm/s  IVC diam: 1.40 cm TAPSE (M-mode): 1.3 cm LEFT ATRIUM             Index        RIGHT ATRIUM          Index LA diam:        3.40 cm 1.83 cm/m   RA Area:     6.60 cm LA Vol (A2C):   25.6 ml 13.75 ml/m  RA Volume:   8.65 ml  4.65 ml/m LA Vol (A4C):   15.9 ml 8.54 ml/m LA Biplane Vol: 21.5 ml 11.55 ml/m  AORTIC VALVE             PULMONIC VALVE LVOT Vmax:   105.00 cm/s PR End Diast Vel: 1.71 msec LVOT Vmean:  81.100 cm/s LVOT VTI:    0.213 m  AORTA Ao Root diam: 3.00 cm Ao Asc diam:  3.40 cm MITRAL VALVE MV Area (PHT): 2.91 cm    SHUNTS MV Decel Time: 261 msec    Systemic VTI:  0.21 m MV E velocity: 63.85 cm/s  Systemic Diam: 2.30 cm MV A velocity: 92.30 cm/s MV E/A ratio:  0.69 Buford Dresser MD Electronically signed by Buford Dresser MD Signature Date/Time: 05/10/2021/3:09:16 PM    Final    VAS [place] LOWER EXTREMITY VENOUS (DVT) (ONLY [facility] & [facility])  Result Date: 05/10/2021  Lower Venous DVT Study Patient Name:  TANESHIA LORENCE  Date of Exam:   05/10/2021 Medical Rec #: [id number]       Accession #:    [id number] Date of Birth: 1960-06-20       Patient Gender: F Patient Age:   54 years Exam [facility]  [facility] Procedure:      VAS [place] LOWER EXTREMITY VENOUS (DVT) Referring Phys: Herbie [place] LOCKWOOD --------------------------------------------------------------------------------  Indications: Bilateral swelling for two weeks., and SOB.  Comparison Study: No priors Performing Technologist: Velva Harman Sturdivant RDMS, RVT  Examination Guidelines: A complete evaluation includes B-mode imaging, spectral Doppler, color Doppler, and power Doppler as needed of all accessible portions of each vessel. Bilateral testing is considered an integral part of a complete examination. Limited examinations  for reoccurring indications may be performed as noted. The reflux portion of the exam is performed with the patient in reverse Trendelenburg.  +---------+---------------+---------+-----------+----------+-----------------+  RIGHT     Compressibility Phasicity Spontaneity Properties Thrombus Aging     +---------+---------------+---------+-----------+----------+-----------------+  CFV       Full            Yes       Yes                                       +---------+---------------+---------+-----------+----------+-----------------+  SFJ       Full                                                                +---------+---------------+---------+-----------+----------+-----------------+  FV Prox   Full                                                                +---------+---------------+---------+-----------+----------+-----------------+  FV Mid    Full                                                                +---------+---------------+---------+-----------+----------+-----------------+  FV Distal Full                                                                +---------+---------------+---------+-----------+----------+-----------------+  PFV       Full                                                                +---------+---------------+---------+-----------+----------+-----------------+  POP       None            No        No                     Age Indeterminate  +---------+---------------+---------+-----------+----------+-----------------+  PTV       Partial                                          Age Indeterminate  +---------+---------------+---------+-----------+----------+-----------------+  PERO      None                                             Age Indeterminate  +---------+---------------+---------+-----------+----------+-----------------+   +---------+---------------+---------+-----------+----------+-----------------+  LEFT       Compressibility Phasicity Spontaneity Properties Thrombus Aging     +---------+---------------+---------+-----------+----------+-----------------+  CFV       Full            Yes       Yes                                       +---------+---------------+---------+-----------+----------+-----------------+  SFJ       Full                                                                +---------+---------------+---------+-----------+----------+-----------------+  FV Prox   Full                                                                +---------+---------------+---------+-----------+----------+-----------------+  FV Mid    Full                                                                +---------+---------------+---------+-----------+----------+-----------------+  FV Distal Full                                                                +---------+---------------+---------+-----------+----------+-----------------+  PFV       None            No        No                     Age Indeterminate  +---------+---------------+---------+-----------+----------+-----------------+  POP       None            No        No                     Age Indeterminate  +---------+---------------+---------+-----------+----------+-----------------+  PTV       Partial                                          Age Indeterminate  +---------+---------------+---------+-----------+----------+-----------------+  PERO      None                                             Age Indeterminate  +---------+---------------+---------+-----------+----------+-----------------+     Summary: RIGHT: - Findings consistent with age indeterminate deep vein thrombosis involving the right popliteal vein, right posterior tibial veins, and right peroneal veins.  LEFT: - Findings consistent with age indeterminate deep vein thrombosis involving the left proximal profunda vein, left popliteal vein, left peroneal veins, and left posterior tibial veins.  *See  table(s) above for measurements and observations. Electronically signed by Deitra Mayo MD on 05/10/2021 at 3:57:31 PM.    Final     Wells Guiles, DO 05/10/2021, 4:00 PM PGY-1, [facility] pager: [phone], text pages welcome  FPTS Upper-Level Resident Addendum   I have independently interviewed and examined the patient. I have discussed the above with the original author and agree with their documentation. Please see also any attending notes.   Gifford Shave, MD PGY-3, [facility] 05/10/2021 6:43 PM  [place] [facility] pager: [phone] (text pages welcome through [facility])

## 2021-05-11 ENCOUNTER — Other Ambulatory Visit (HOSPITAL_COMMUNITY): Payer: Self-pay

## 2021-05-11 ENCOUNTER — Inpatient Hospital Stay (HOSPITAL_COMMUNITY): Payer: PRIVATE HEALTH INSURANCE

## 2021-05-11 DIAGNOSIS — I82403 Acute embolism and thrombosis of unspecified deep veins of lower extremity, bilateral: Secondary | ICD-10-CM | POA: Diagnosis not present

## 2021-05-11 DIAGNOSIS — Z880 Allergy status to penicillin: Secondary | ICD-10-CM | POA: Diagnosis not present

## 2021-05-11 DIAGNOSIS — G936 Cerebral edema: Secondary | ICD-10-CM | POA: Diagnosis present

## 2021-05-11 DIAGNOSIS — I2699 Other pulmonary embolism without acute cor pulmonale: Secondary | ICD-10-CM | POA: Diagnosis present

## 2021-05-11 DIAGNOSIS — K219 Gastro-esophageal reflux disease without esophagitis: Secondary | ICD-10-CM | POA: Diagnosis present

## 2021-05-11 DIAGNOSIS — F410 Panic disorder [episodic paroxysmal anxiety] without agoraphobia: Secondary | ICD-10-CM | POA: Diagnosis present

## 2021-05-11 DIAGNOSIS — Z8249 Family history of ischemic heart disease and other diseases of the circulatory system: Secondary | ICD-10-CM | POA: Diagnosis not present

## 2021-05-11 DIAGNOSIS — Z885 Allergy status to narcotic agent status: Secondary | ICD-10-CM | POA: Diagnosis not present

## 2021-05-11 DIAGNOSIS — Z923 Personal history of irradiation: Secondary | ICD-10-CM | POA: Diagnosis not present

## 2021-05-11 DIAGNOSIS — Z86718 Personal history of other venous thrombosis and embolism: Secondary | ICD-10-CM | POA: Diagnosis not present

## 2021-05-11 DIAGNOSIS — R569 Unspecified convulsions: Secondary | ICD-10-CM | POA: Diagnosis present

## 2021-05-11 DIAGNOSIS — F17290 Nicotine dependence, other tobacco product, uncomplicated: Secondary | ICD-10-CM | POA: Diagnosis present

## 2021-05-11 DIAGNOSIS — Z85818 Personal history of malignant neoplasm of other sites of lip, oral cavity, and pharynx: Secondary | ICD-10-CM | POA: Diagnosis not present

## 2021-05-11 DIAGNOSIS — Z833 Family history of diabetes mellitus: Secondary | ICD-10-CM | POA: Diagnosis not present

## 2021-05-11 DIAGNOSIS — R7303 Prediabetes: Secondary | ICD-10-CM | POA: Diagnosis present

## 2021-05-11 DIAGNOSIS — G510 Bell's palsy: Secondary | ICD-10-CM | POA: Diagnosis present

## 2021-05-11 DIAGNOSIS — Z803 Family history of malignant neoplasm of breast: Secondary | ICD-10-CM | POA: Diagnosis not present

## 2021-05-11 DIAGNOSIS — I824Y3 Acute embolism and thrombosis of unspecified deep veins of proximal lower extremity, bilateral: Secondary | ICD-10-CM | POA: Diagnosis not present

## 2021-05-11 DIAGNOSIS — Z818 Family history of other mental and behavioral disorders: Secondary | ICD-10-CM | POA: Diagnosis not present

## 2021-05-11 DIAGNOSIS — J9601 Acute respiratory failure with hypoxia: Secondary | ICD-10-CM | POA: Diagnosis present

## 2021-05-11 DIAGNOSIS — F32A Depression, unspecified: Secondary | ICD-10-CM | POA: Diagnosis present

## 2021-05-11 DIAGNOSIS — D696 Thrombocytopenia, unspecified: Secondary | ICD-10-CM | POA: Diagnosis present

## 2021-05-11 DIAGNOSIS — E785 Hyperlipidemia, unspecified: Secondary | ICD-10-CM | POA: Diagnosis present

## 2021-05-11 DIAGNOSIS — D638 Anemia in other chronic diseases classified elsewhere: Secondary | ICD-10-CM | POA: Diagnosis present

## 2021-05-11 DIAGNOSIS — Z79899 Other long term (current) drug therapy: Secondary | ICD-10-CM | POA: Diagnosis not present

## 2021-05-11 DIAGNOSIS — Z20822 Contact with and (suspected) exposure to covid-19: Secondary | ICD-10-CM | POA: Diagnosis present

## 2021-05-11 LAB — CBC
HCT: 33.9 % — ABNORMAL LOW (ref 36.0–46.0)
Hemoglobin: 11.1 g/dL — ABNORMAL LOW (ref 12.0–15.0)
MCH: 33.1 pg (ref 26.0–34.0)
MCHC: 32.7 g/dL (ref 30.0–36.0)
MCV: 101.2 fL — ABNORMAL HIGH (ref 80.0–100.0)
Platelets: 131 10*3/uL — ABNORMAL LOW (ref 150–400)
RBC: 3.35 MIL/uL — ABNORMAL LOW (ref 3.87–5.11)
RDW: 16.6 % — ABNORMAL HIGH (ref 11.5–15.5)
WBC: 7 10*3/uL (ref 4.0–10.5)
nRBC: 0.4 % — ABNORMAL HIGH (ref 0.0–0.2)

## 2021-05-11 LAB — BASIC METABOLIC PANEL
Anion gap: 12 (ref 5–15)
BUN: 12 mg/dL (ref 6–20)
CO2: 28 mmol/L (ref 22–32)
Calcium: 8.3 mg/dL — ABNORMAL LOW (ref 8.9–10.3)
Chloride: 94 mmol/L — ABNORMAL LOW (ref 98–111)
Creatinine, Ser: 0.65 mg/dL (ref 0.44–1.00)
GFR, Estimated: >60 mL/min (ref >60)
Glucose, Bld: 119 mg/dL — ABNORMAL HIGH (ref 70–99)
Potassium: 3.7 mmol/L (ref 3.5–5.1)
Sodium: 134 mmol/L — ABNORMAL LOW (ref 135–145)

## 2021-05-11 LAB — APTT
aPTT: 158 seconds — ABNORMAL HIGH (ref 24–36)
aPTT: 90 seconds — ABNORMAL HIGH (ref 24–36)

## 2021-05-11 LAB — HEPARIN LEVEL (UNFRACTIONATED): Heparin Unfractionated: >1.1 IU/mL — ABNORMAL HIGH (ref 0.30–0.70)

## 2021-05-11 MED ORDER — NICOTINE 7 MG/24HR TD PT24
7.0000 mg | MEDICATED_PATCH | Freq: Every day | TRANSDERMAL | Status: DC
  Administered 2021-05-11 – 2021-05-14 (×4): 7 mg via TRANSDERMAL
  Filled 2021-05-11 (×5): qty 1

## 2021-05-11 MED ORDER — POTASSIUM CHLORIDE CRYS ER 20 MEQ PO TBCR
40.0000 meq | EXTENDED_RELEASE_TABLET | Freq: Once | ORAL | Status: AC
  Administered 2021-05-11: 13:00:00 40 meq via ORAL
  Filled 2021-05-11: qty 2

## 2021-05-11 MED ORDER — GERHARDT'S BUTT CREAM
TOPICAL_CREAM | Freq: Three times a day (TID) | CUTANEOUS | Status: DC
  Filled 2021-05-11: qty 1

## 2021-05-11 MED ORDER — HEPARIN (PORCINE) 25000 UT/250ML-% IV SOLN
1000.0000 [IU]/h | INTRAVENOUS | Status: DC
  Administered 2021-05-11 (×2): 1000 [IU]/h via INTRAVENOUS
  Filled 2021-05-11: qty 250

## 2021-05-11 NOTE — Progress Notes (Signed)
PT Cancellation Note  Patient Details Name: Melissa Case MRN: [id number] DOB: 1960/11/19   Cancelled Treatment:    Reason Eval/Treat Not Completed: Medical issues which prohibited therapy. Pt with newly diagnosed PE, heparin initiated last night. Will hold for PT Evaluation until heparin levels rechecked for therapeutic ranges. Confirmed with providing physician who is in agreement.   Mabeline Caras, PT, DPT Acute Rehabilitation Services  Pager [phone] Office [place] 05/11/2021, 7:53 AM

## 2021-05-11 NOTE — TOC Benefit Eligibility Note (Signed)
Patient Teacher, English as a foreign language completed.    The patient is currently admitted and upon discharge could be taking Eliquis 5 mg.  The current 30 day co-pay is, $0.00.   The patient is insured through [place], [place] Patient Advocate Specialist [place] Patient Advocate Team Direct Number: [phone]  Fax: [phone]

## 2021-05-11 NOTE — TOC Progression Note (Signed)
Transition of Care [facility]) - Progression Note    Patient Details  Name: Melissa Case MRN: [id number] Date of Birth: 1960/07/08  Transition of Care [facility]) CM/SW [place], [place] Phone Number: 05/11/2021, 1:38 PM  Clinical Narrative:     Transition of Care (TOC) Screening Note   Patient Details  Name: Melissa Case Date of Birth: 20-Mar-1961   Transition of Care [facility]) CM/SW Contact:    Reece Agar, Latanya Presser Phone Number: 05/11/2021, 1:38 PM    Transition of Care Department [facility]) has reviewed patient and no TOC needs have been identified at this time. We will continue to monitor patient advancement through interdisciplinary progression rounds. If new patient transition needs arise, please place a TOC consult.          Expected Discharge Plan and Services                                                 Social Determinants of Health (SDOH) Interventions    Readmission Risk Interventions No flowsheet data found.

## 2021-05-11 NOTE — Progress Notes (Addendum)
Pt eating lunch with [place] off. Pt O2 sat with good pleth dropped to 77%. [place] returned to pts face at 4L, saturation improved to 94%. Pt endorses no use of home oxygen prior to this admission. Care team notified.

## 2021-05-11 NOTE — Consult Note (Addendum)
WOC Nurse Consult Note: Reason for Consult: Consult requested for right ischium and bilat buttocks Wound type: Right ischium with healing Unstageable pressure injury; Pt states it has greatly improved. .5X.5cm dry eschar, no odor, drainage, or fluctuance Bilat buttocks and sacrum with scattered areas of red moist partial thickness small shallow skin tears.  She also has multiple areas of darker skin from previous wounds which have healed and are dry scar tissue.  Pressure Injury POA: Yes Dressing procedure/placement/frequency: Topical treatment orders provided for bedside nurses to perform as follows to promote moist healing and protect skin: Apply Gerhardts butt cream to right ischium and bilat buttocks/sacrum TID and PRN if turning or cleaning. Please re-consult if further assistance is needed.  Thank-you,  Julien Girt MSN, [place], [place], [place], [place]

## 2021-05-11 NOTE — Progress Notes (Signed)
ANTICOAGULATION CONSULT NOTE - Follow Up Consult  Pharmacy Consult for Heparin Indication: pulmonary embolus and DVT  Allergies  Allergen Reactions   Morphine Itching   Penicillins Rash    Has patient had a PCN reaction causing immediate rash, facial/tongue/throat swelling, SOB or lightheadedness with hypotension: No Has patient had a PCN reaction causing severe rash involving mucus membranes or skin necrosis: No Has patient had a PCN reaction that required hospitalization No Has patient had a PCN reaction occurring within the last 10 years: No If all of the above answers are "NO", then may proceed with Cephalosporin use.     Patient Measurements: Height: 5\' 4"  (162.6 cm) IBW/kg (Calculated) : 54.7 Weight 80.7 kg Heparin Dosing Weight: 74 kg  Vital Signs: Temp: 97.6 F (36.4 C) (12/21 1036) Temp Source: Oral (12/21 1036) BP: 110/64 (12/21 1036) Pulse Rate: 80 (12/21 1036)  Labs: Recent Labs    05/10/21 1030 05/10/21 1500 05/11/21 0108 05/11/21 1126  HGB 11.8*  --  11.1*  --   HCT 36.7  --  33.9*  --   PLT 152  --  131*  --   APTT  --   --   --  158*  HEPARINUNFRC  --   --   --  >1.10*  CREATININE 0.54  --  0.65  --   TROPONINIHS 11 14  --   --     Estimated Creatinine Clearance: 76.9 mL/min (by C-G formula based on SCr of 0.65 mg/dL).   Medications:  Scheduled:   dexamethasone  4 mg Oral Q12H   divalproex  750 mg Oral BID   Gerhardt's butt cream   Topical TID   levETIRAcetam  500 mg Oral BID   nicotine  7 mg Transdermal Daily   pantoprazole  40 mg Oral Daily   PARoxetine  40 mg Oral Daily   Infusions:   heparin 1,200 Units/hr (05/11/21 6144)    Assessment: 60 yo F with acute PE and bilateral DVT.  Pt received Apixaban 12/20 at 1615 then was transitioned to heparin in the event that procedures were needed.  aPTT 158 on 1200 units/hr Heparin level >1.1 as expected after apixaban dose  Goal of Therapy:  Heparin level 0.3-0.7 units/ml aPTT 66-102  seconds Monitor platelets by anticoagulation protocol: Yes   Plan:  Hold heparin for 1 hour Restart heparin at 1000 units/hr Next heparin level at [place], Pharm.D., BCPS Clinical Pharmacist Clinical phone for 05/11/2021 from 7:30-3:00 is [phone].  **Pharmacist phone directory can be found on [email or web address] listed under [place].  05/11/2021 1:48 PM

## 2021-05-11 NOTE — Progress Notes (Signed)
[place] for Heparin Indication: pulmonary embolus and DVT  Allergies  Allergen Reactions   Morphine Itching   Penicillins Rash    Has patient had a PCN reaction causing immediate rash, facial/tongue/throat swelling, SOB or lightheadedness with hypotension: No Has patient had a PCN reaction causing severe rash involving mucus membranes or skin necrosis: No Has patient had a PCN reaction that required hospitalization No Has patient had a PCN reaction occurring within the last 10 years: No If all of the above answers are "NO", then may proceed with Cephalosporin use.     Patient Measurements: Height: 5\' 4"  (162.6 cm) IBW/kg (Calculated) : 54.7 Weight 80.7 kg Heparin Dosing Weight: 74 kg  Vital Signs: Temp: 97.9 F (36.6 C) (12/21 1900) Temp Source: Oral (12/21 1900) BP: 114/64 (12/21 1900) Pulse Rate: 86 (12/21 1900)  Labs: Recent Labs    05/10/21 1030 05/10/21 1500 05/11/21 0108 05/11/21 1126 05/11/21 2123  HGB 11.8*  --  11.1*  --   --   HCT 36.7  --  33.9*  --   --   PLT 152  --  131*  --   --   APTT  --   --   --  158* 90*  HEPARINUNFRC  --   --   --  >1.10*  --   CREATININE 0.54  --  0.65  --   --   TROPONINIHS 11 14  --   --   --      Estimated Creatinine Clearance: 76.9 mL/min (by C-G formula based on SCr of 0.65 mg/dL).  Assessment: 60 yo F with acute PE and bilateral DVT.  Pt received Apixaban 12/20 at 1615 then was transitioned to heparin in the event that procedures were needed.  aPTT therapeutic at 90 sec after heparin was restarted; no bleeding reported.  Goal of Therapy:  Heparin level 0.3-0.7 units/ml aPTT 66-102 seconds Monitor platelets by anticoagulation protocol: Yes   Plan:  Continue heparin gtt at 1000 units/hr F/U AM labs  Shauntia Levengood D. Mina Marble, PharmD, BCPS, [place] 05/11/2021, 10:15 PM

## 2021-05-11 NOTE — Progress Notes (Addendum)
Family Medicine Teaching Service Daily Progress Note Intern Pager: [phone]  Patient name: Melissa Case Medical record number: [id number] Date of birth: 23-Aug-1960 Age: 60 y.o. Gender: female  Primary Case Provider: Camillia Herter, NP Consultants: None Code Status: Full  Pt Overview and Major Events to Date:  12/20: admitted to FPTS  Assessment and Plan: Melissa Case is a 60 y.o. female presenting with new oxygen requirement in the setting of acute/subacute bilateral DVTs . PMH is significant for parotid cancer s/p radiation and resection, GERD, panic attacks, seizures.  PE and bilateral DVT   parotid cancer status postradiation and parotidectomy Patient required increasing oxygenation yesterday, now on 3L [place].  She is now on heparin drip.  Neurosurgery consulted and advised that unless patient has active brain bleeding, they are not concerned with anticoagulation use.  Foot of Ten CT has not been performed due to recent contrast use.  Will obtain this once 24-hour time period has elapsed.  Patient has been continuously monitored due to increased risk of cardiac events in the setting of PE. -NCH CT this p.m. -PT/OT eval and treat, holding while on therapeutic heparin -Continuous cardiac monitoring indefinite -Heparin per pharmacy, consider transitioning at later date -Consider consulting pulmonology if respiratory status significantly worsens -Continue Decadron 4 mg every 12 hours -Replete potassium with Klor-Con 40 mEq once  FEN/GI: Regular PPx: Heparin drip Dispo:Home pending clinical improvement . Barriers include clinical improvement and further diagnostic work-up in high risk patient.   Subjective:  Patient states she is doing all right this morning.  She states she is breathing well with the oxygen but gets very tired out when she has to stand.  Does not have any other complaints at this time.  Objective: Temp:  [96.5 F (35.8 C)-98.8 F (37.1 C)] 98.5 F (36.9 C) (12/21  0341) Pulse Rate:  [73-106] 87 (12/21 0341) Resp:  [15-29] 18 (12/21 0341) BP: (92-136)/(49-84) 112/66 (12/21 0341) SpO2:  [89 %-100 %] 94 % (12/21 0341) Physical Exam: General: A&O x3, NAD Cardiovascular: RRR, no murmurs auscultated Respiratory: No crackles or wheezing appreciated, increased work of breathing when attempting to exert herself while sitting up,  in place on 3 L Extremities: 3+ pitting edema lower extremities below the knees  Laboratory: Recent Labs  Lab 05/10/21 1030 05/11/21 0108  WBC 6.0 7.0  HGB 11.8* 11.1*  HCT 36.7 33.9*  PLT 152 131*   Recent Labs  Lab 05/10/21 1030 05/11/21 0108  NA 133* 134*  K 3.6 3.7  CL 97* 94*  CO2 23 28  BUN 13 12  CREATININE 0.54 0.65  CALCIUM 8.4* 8.3*  PROT 5.7*  --   BILITOT 0.5  --   ALKPHOS 40  --   ALT 41  --   AST 25  --   GLUCOSE 148* 119*   B12, Folate: wnl  Imaging/Diagnostic Tests: DG Chest 2 View  Result Date: 05/10/2021 CLINICAL DATA:  Shortness of breath EXAM: CHEST - 2 VIEW COMPARISON:  04/20/2021 FINDINGS: The heart size and mediastinal contours are within normal limits. Mild, diffuse bilateral interstitial pulmonary opacity. Disc degenerative disease of the thoracic spine. IMPRESSION: Mild, diffuse bilateral interstitial pulmonary opacity, likely edema. No focal airspace opacity. Electronically Signed   By: Delanna Ahmadi M.D.   On: 05/10/2021 10:57   CT Angio Chest PE W and/or Wo Contrast  Result Date: 05/10/2021 CLINICAL DATA:  Chest pain and increasing oxygen requirement, initial encounter EXAM: CT ANGIOGRAPHY CHEST WITH CONTRAST TECHNIQUE: Multidetector CT imaging of the chest was  performed using the standard protocol during bolus administration of intravenous contrast. Multiplanar CT image reconstructions and MIPs were obtained to evaluate the vascular anatomy. CONTRAST:  55mL OMNIPAQUE IOHEXOL 350 MG/ML SOLN COMPARISON:  Chest x-ray from earlier in the same day, 11/18/2020. FINDINGS:  Cardiovascular: Thoracic aorta demonstrates atherosclerotic calcifications without aneurysmal dilatation or dissection. No cardiac enlargement is seen. The pulmonary artery is well visualized within normal branching pattern bilaterally. Bilateral filling defects are identified consistent with pulmonary emboli slightly greater on the right than the left. No central saddle component is noted. No findings to suggest right heart strain are noted. Coronary calcifications are noted. No significant pericardial effusion is seen. Mediastinum/Nodes: Thoracic inlet demonstrates a prominent partially calcified nodule within the right lobe of thyroid. This measures up to 3.1 cm and is stable from a prior CT from 11/18/2020. Additionally a recent PET-CT from 10/29/2020 showed no hypermetabolic activity in this region scattered small mediastinal lymph nodes are seen. None of these are significant by size criteria. The esophagus as visualized is within normal limits. Lungs/Pleura: Lungs are well aerated bilaterally. Some mosaic attenuation is noted consistent with air trapping. Patchy ground-glass opacities are noted in the upper lobes bilaterally new from prior examination and likely related to the underlying pulmonary emboli. A small pleural based rounded density is noted in the right lower lobe which may represent early changes of pulmonary infarction. Upper Abdomen: Visualized upper abdomen is within normal limits. Musculoskeletal: Degenerative changes of the thoracic spine are noted. No rib abnormality is seen. Review of the MIP images confirms the above findings. IMPRESSION: Significant bilateral pulmonary emboli without definitive right heart strain. Patchy airspace opacities are noted primarily in the upper lobes bilaterally likely related to pulmonary emboli. Additionally a rounded pleural based density is noted in the posterior right lower lobe which may represent an early pulmonary infarct. Prominent thyroid nodule  measuring up to 3.1 cm. This is stable in appearance from a prior CT examination and showed no significant hypermetabolic activity on recent PET-CT from 10/29/2020. Additionally these changes were seen on prior ultrasound from 03/26/2015. Recommend nonemergent follow-up thyroid US (Ref: J Am Coll Radiol. 2015 Feb;12(2): 143-50). Aortic Atherosclerosis (ICD10-I70.0). Critical Value/emergent results were called by telephone at the time of interpretation on 05/10/2021 at 5:00 pm to Dr. Caron Presume , who verbally acknowledged these results. Electronically Signed   By: Inez Catalina M.D.   On: 05/10/2021 17:04   ECHOCARDIOGRAM COMPLETE  Result Date: 05/10/2021    ECHOCARDIOGRAM REPORT   Patient Name:   TYE JUAREZ Date of Exam: 05/10/2021 Medical Rec #:  [id number]      Height:       64.0 in Accession #:    [id number]     Weight:       178.0 lb Date of Birth:  [birth year]      BSA:          1.862 m Patient Age:    45 years       BP:           129/75 mmHg Patient Gender: F              HR:           76 bpm. Exam Location:  Inpatient Procedure: 2D Echo, 3D Echo, Cardiac Doppler and Color Doppler Indications:    I50.40* Unspecified combined systolic (congestive) and diastolic                 (congestive) heart failure  History:  Patient has no prior history of Echocardiogram examinations.                 Risk Factors:Dyslipidemia. Brain cancer. Seizure.  Sonographer:    Roseanna Rainbow RDCS Referring Phys: [place]  Sonographer Comments: Image acquisition challenging due to patient body habitus. IMPRESSIONS  1. Left ventricular ejection fraction, by estimation, is 55 to 60%. The left ventricle has normal function. The left ventricle has no regional wall motion abnormalities. There is mild concentric left ventricular hypertrophy. Left ventricular diastolic parameters are indeterminate.  2. Right ventricular systolic function is normal. The right ventricular size is normal. Tricuspid regurgitation signal is  inadequate for assessing PA pressure.  3. The mitral valve is normal in structure. No evidence of mitral valve regurgitation. No evidence of mitral stenosis.  4. The aortic valve is tricuspid. Aortic valve regurgitation is not visualized. No aortic stenosis is present.  5. The inferior vena cava is normal in size with greater than 50% respiratory variability, suggesting right atrial pressure of 3 mmHg. Comparison(s): No prior Echocardiogram. Conclusion(s)/Recommendation(s): Normal biventricular function without evidence of hemodynamically significant valvular heart disease. FINDINGS  Left Ventricle: Left ventricular ejection fraction, by estimation, is 55 to 60%. The left ventricle has normal function. The left ventricle has no regional wall motion abnormalities. The left ventricular internal cavity size was normal in size. There is  mild concentric left ventricular hypertrophy. Left ventricular diastolic parameters are indeterminate. Right Ventricle: The right ventricular size is normal. No increase in right ventricular wall thickness. Right ventricular systolic function is normal. Tricuspid regurgitation signal is inadequate for assessing PA pressure. Left Atrium: Left atrial size was normal in size. Right Atrium: Right atrial size was normal in size. Pericardium: There is no evidence of pericardial effusion. Mitral Valve: The mitral valve is normal in structure. No evidence of mitral valve regurgitation. No evidence of mitral valve stenosis. Tricuspid Valve: The tricuspid valve is normal in structure. Tricuspid valve regurgitation is trivial. No evidence of tricuspid stenosis. Aortic Valve: The aortic valve is tricuspid. Aortic valve regurgitation is not visualized. No aortic stenosis is present. Pulmonic Valve: The pulmonic valve was grossly normal. Pulmonic valve regurgitation is trivial. No evidence of pulmonic stenosis. Aorta: The aortic root, ascending aorta, aortic arch and descending aorta are all  structurally normal, with no evidence of dilitation or obstruction. Venous: The inferior vena cava is normal in size with greater than 50% respiratory variability, suggesting right atrial pressure of 3 mmHg. IAS/Shunts: The atrial septum is grossly normal.  LEFT VENTRICLE PLAX 2D LVIDd:         3.80 cm     Diastology LVIDs:         2.60 cm     LV e' medial:    4.03 cm/s LV PW:         1.40 cm     LV E/e' medial:  15.8 LV IVS:        1.40 cm     LV e' lateral:   5.66 cm/s LVOT diam:     2.30 cm     LV E/e' lateral: 11.3 LV SV:         88 LV SV Index:   48 LVOT Area:     4.15 cm                             3D Volume EF: LV Volumes (MOD)  3D EF:        57 % LV vol d, MOD A2C: 45.1 ml LV EDV:       132 ml LV vol d, MOD A4C: 45.0 ml LV ESV:       56 ml LV vol s, MOD A2C: 15.8 ml LV SV:        75 ml LV vol s, MOD A4C: 16.5 ml LV SV MOD A2C:     29.3 ml LV SV MOD A4C:     45.0 ml LV SV MOD BP:      28.4 ml RIGHT VENTRICLE             IVC RV S prime:     10.00 cm/s  IVC diam: 1.40 cm TAPSE (M-mode): 1.3 cm LEFT ATRIUM             Index        RIGHT ATRIUM          Index LA diam:        3.40 cm 1.83 cm/m   RA Area:     6.60 cm LA Vol (A2C):   25.6 ml 13.75 ml/m  RA Volume:   8.65 ml  4.65 ml/m LA Vol (A4C):   15.9 ml 8.54 ml/m LA Biplane Vol: 21.5 ml 11.55 ml/m  AORTIC VALVE             PULMONIC VALVE LVOT Vmax:   105.00 cm/s PR End Diast Vel: 1.71 msec LVOT Vmean:  81.100 cm/s LVOT VTI:    0.213 m  AORTA Ao Root diam: 3.00 cm Ao Asc diam:  3.40 cm MITRAL VALVE MV Area (PHT): 2.91 cm    SHUNTS MV Decel Time: 261 msec    Systemic VTI:  0.21 m MV E velocity: 63.85 cm/s  Systemic Diam: 2.30 cm MV A velocity: 92.30 cm/s MV E/A ratio:  0.69 Buford Dresser MD Electronically signed by Buford Dresser MD Signature Date/Time: 05/10/2021/3:09:16 PM    Final    VAS [place] LOWER EXTREMITY VENOUS (DVT) (ONLY [facility] & [facility])  Result Date: 05/10/2021  Lower Venous DVT Study Patient Name:  ADRIANAH PROPHETE  Date of  Exam:   05/10/2021 Medical Rec #: [id number]       Accession #:    [id number] Date of Birth: 17-Jul-1960       Patient Gender: F Patient Age:   21 years Exam Location:  [facility] Procedure:      VAS [place] LOWER EXTREMITY VENOUS (DVT) Referring Phys: Herbie Baltimore LOCKWOOD --------------------------------------------------------------------------------  Indications: Bilateral swelling for two weeks., and SOB.  Comparison Study: No priors Performing Technologist: Velva Harman Sturdivant RDMS, RVT  Examination Guidelines: A complete evaluation includes B-mode imaging, spectral Doppler, color Doppler, and power Doppler as needed of all accessible portions of each vessel. Bilateral testing is considered an integral part of a complete examination. Limited examinations for reoccurring indications may be performed as noted. The reflux portion of the exam is performed with the patient in reverse Trendelenburg.  +---------+---------------+---------+-----------+----------+-----------------+  RIGHT     Compressibility Phasicity Spontaneity Properties Thrombus Aging     +---------+---------------+---------+-----------+----------+-----------------+  CFV       Full            Yes       Yes                                       +---------+---------------+---------+-----------+----------+-----------------+  SFJ       Full                                                                +---------+---------------+---------+-----------+----------+-----------------+  FV Prox   Full                                                                +---------+---------------+---------+-----------+----------+-----------------+  FV Mid    Full                                                                +---------+---------------+---------+-----------+----------+-----------------+  FV Distal Full                                                                +---------+---------------+---------+-----------+----------+-----------------+  PFV       Full                                                                 +---------+---------------+---------+-----------+----------+-----------------+  POP       None            No        No                     Age Indeterminate  +---------+---------------+---------+-----------+----------+-----------------+  PTV       Partial                                          Age Indeterminate  +---------+---------------+---------+-----------+----------+-----------------+  PERO      None                                             Age Indeterminate  +---------+---------------+---------+-----------+----------+-----------------+   +---------+---------------+---------+-----------+----------+-----------------+  LEFT      Compressibility Phasicity Spontaneity Properties Thrombus Aging     +---------+---------------+---------+-----------+----------+-----------------+  CFV       Full            Yes       Yes                                       +---------+---------------+---------+-----------+----------+-----------------+  SFJ       Full                                                                +---------+---------------+---------+-----------+----------+-----------------+  FV Prox   Full                                                                +---------+---------------+---------+-----------+----------+-----------------+  FV Mid    Full                                                                +---------+---------------+---------+-----------+----------+-----------------+  FV Distal Full                                                                +---------+---------------+---------+-----------+----------+-----------------+  PFV       None            No        No                     Age Indeterminate  +---------+---------------+---------+-----------+----------+-----------------+  POP       None            No        No                     Age Indeterminate   +---------+---------------+---------+-----------+----------+-----------------+  PTV       Partial                                          Age Indeterminate  +---------+---------------+---------+-----------+----------+-----------------+  PERO      None                                             Age Indeterminate  +---------+---------------+---------+-----------+----------+-----------------+     Summary: RIGHT: - Findings consistent with age indeterminate deep vein thrombosis involving the right popliteal vein, right posterior tibial veins, and right peroneal veins.  LEFT: - Findings consistent with age indeterminate deep vein thrombosis involving the left proximal profunda vein, left popliteal vein, left peroneal veins, and left posterior tibial veins.  *See table(s) above for measurements and observations. Electronically signed by Deitra Mayo MD on 05/10/2021 at 3:57:31 PM.    Final      Wells Guiles, DO 05/11/2021, 7:00 AM PGY-1, [place] Intern pager: [phone], text pages welcome

## 2021-05-11 NOTE — Plan of Care (Signed)
°  Problem: Health Behavior/Discharge Planning: Goal: Ability to manage health-related needs will improve Outcome: Not Progressing   Problem: Clinical Measurements: Goal: Ability to maintain clinical measurements within normal limits will improve Outcome: Not Progressing Goal: Diagnostic test results will improve Outcome: Not Progressing   Problem: Coping: Goal: Level of anxiety will decrease Outcome: Not Progressing   Problem: Pain Managment: Goal: General experience of comfort will improve Outcome: Not Progressing

## 2021-05-11 NOTE — Progress Notes (Signed)
OT Cancellation Note  Patient Details Name: Melissa Case MRN: [id number] DOB: 1960-07-28   Cancelled Treatment:    Reason Eval/Treat Not Completed: Medical issues which prohibited therapy Pt with newly diagnosed PE and start of heparin per pharmacy. Will hold OT eval until heparin levels rechecked for therapeutic ranges and follow-up with MD as pt medically appropriate.  Layla Maw 05/11/2021, 7:03 AM

## 2021-05-12 DIAGNOSIS — J9601 Acute respiratory failure with hypoxia: Secondary | ICD-10-CM

## 2021-05-12 DIAGNOSIS — I82403 Acute embolism and thrombosis of unspecified deep veins of lower extremity, bilateral: Secondary | ICD-10-CM | POA: Diagnosis not present

## 2021-05-12 DIAGNOSIS — I2699 Other pulmonary embolism without acute cor pulmonale: Secondary | ICD-10-CM | POA: Diagnosis not present

## 2021-05-12 LAB — CBC
HCT: 29.4 % — ABNORMAL LOW (ref 36.0–46.0)
HCT: 30.1 % — ABNORMAL LOW (ref 36.0–46.0)
Hemoglobin: 9.6 g/dL — ABNORMAL LOW (ref 12.0–15.0)
Hemoglobin: 9.9 g/dL — ABNORMAL LOW (ref 12.0–15.0)
MCH: 33.1 pg (ref 26.0–34.0)
MCH: 33.2 pg (ref 26.0–34.0)
MCHC: 32.7 g/dL (ref 30.0–36.0)
MCHC: 32.9 g/dL (ref 30.0–36.0)
MCV: 101.4 fL — ABNORMAL HIGH (ref 80.0–100.0)
MCV: 101 fL — ABNORMAL HIGH (ref 80.0–100.0)
Platelets: 122 10*3/uL — ABNORMAL LOW (ref 150–400)
Platelets: 135 10*3/uL — ABNORMAL LOW (ref 150–400)
RBC: 2.9 MIL/uL — ABNORMAL LOW (ref 3.87–5.11)
RBC: 2.98 MIL/uL — ABNORMAL LOW (ref 3.87–5.11)
RDW: 15.9 % — ABNORMAL HIGH (ref 11.5–15.5)
RDW: 15.9 % — ABNORMAL HIGH (ref 11.5–15.5)
WBC: 5.6 10*3/uL (ref 4.0–10.5)
WBC: 5.4 10*3/uL (ref 4.0–10.5)
nRBC: 0 % (ref 0.0–0.2)
nRBC: 0 % (ref 0.0–0.2)

## 2021-05-12 LAB — APTT: aPTT: 98 seconds — ABNORMAL HIGH (ref 24–36)

## 2021-05-12 LAB — HEPARIN LEVEL (UNFRACTIONATED): Heparin Unfractionated: 0.76 IU/mL — ABNORMAL HIGH (ref 0.30–0.70)

## 2021-05-12 MED ORDER — APIXABAN 5 MG PO TABS: 5.0000 mg | ORAL_TABLET | Freq: Two times a day (BID) | ORAL | Status: DC

## 2021-05-12 MED ORDER — APIXABAN 5 MG PO TABS
10.0000 mg | ORAL_TABLET | Freq: Two times a day (BID) | ORAL | Status: DC
  Administered 2021-05-12 – 2021-05-14 (×5): 10 mg via ORAL
  Filled 2021-05-12 (×5): qty 2

## 2021-05-12 NOTE — Progress Notes (Signed)
Family Medicine Teaching Service Daily Progress Note Intern Pager: [phone]  Patient name: Melissa Case Medical record number: [id number] Date of birth: [birth year] Age: 60 y.o. Gender: female  Primary Care Provider: Camillia Herter, NP Consultants: None Code Status: Full  Pt Overview and Major Events to Date:  12/20: admitted to [facility] 12/22: transition to Eliquis  Assessment and Plan: Melissa Case is a 60 y.o. female PE and b/l DVTs . PMH is significant for parotid cancer s/p radiation and resection, GERD, panic attacks, seizures.  Acute hypoxic respiratory failure 2/2 PE   bilateral DVT Patient remains on heparin drip and has done well with this.  Noncontrast head CT showed no acute intracranial abnormality.  Mild right temporal encephalomalacia versus vasogenic edema. OT advised SNF. Pt will likely be oxygen dependent upon discharge and will require long term anticoagulation to ensure full resolve of PE. -Continue PT/OT, appreciate assistance -Continuous cardiac monitoring and definite -Transition to Eliquis 10mg  BID x 7 days today, followed by 5mg  BID -Continue Decadron 4 mg every 12 hours  Anemia Hemoglobin dropped from 11.1 to 9.6.  Repeat CBC ordered by night team. -monitor CBC  FEN/GI: Regular PPx: Eliquis 10mg  BID Dispo:SNF in 2-3 days. Pending clinical improvement.   Subjective:  Patient states she is doing well this morning slept well overnight.  She "loses oxygen" during physical therapy but does well when she is sitting in chair with oxygen.  Objective: Temp:  [97.6 F (36.4 C)-98.5 F (36.9 C)] 98.5 F (36.9 C) (12/22 0735) Pulse Rate:  [78-90] 78 (12/22 0735) Resp:  [18-23] 19 (12/22 0735) BP: (110-123)/(60-85) 117/75 (12/22 0735) SpO2:  [92 %-95 %] 94 % (12/22 0735) Physical Exam: General: A&O x3, NAD, comfortable in recliner Cardiovascular: RRR, no murmurs auscultated Respiratory: [place] in place on 2.5 L, no increased work of breathing at rest, no  crackles or wheezing appreciated Extremities: 2+ pitting edema lower extremities  Laboratory: Recent Labs  Lab 05/10/21 1030 05/11/21 0108 05/12/21 0113  WBC 6.0 7.0 5.6  HGB 11.8* 11.1* 9.6*  HCT 36.7 33.9* 29.4*  PLT 152 131* 122*   Recent Labs  Lab 05/10/21 1030 05/11/21 0108  NA 133* 134*  K 3.6 3.7  CL 97* 94*  CO2 23 28  BUN 13 12  CREATININE 0.54 0.65  CALCIUM 8.4* 8.3*  PROT 5.7*  --   BILITOT 0.5  --   ALKPHOS 40  --   ALT 41  --   AST 25  --   GLUCOSE 148* 119*   Imaging/Diagnostic Tests: CT HEAD WO CONTRAST (5MM)  Result Date: 05/12/2021 CLINICAL DATA:  CT Head Brain metastases, assess treatment response; No new neurologic symptoms noted Patient present on heparin; Patient has hx of PE. EXAM: CT HEAD WITHOUT CONTRAST TECHNIQUE: Contiguous axial images were obtained from the base of the skull through the vertex without intravenous contrast. COMPARISON:  CT head 04/20/2021 FINDINGS: Brain: Mild right temporal encephalomalacia versus vasogenic edema. No evidence of large-territorial acute infarction. No parenchymal hemorrhage. No mass lesion. No extra-axial collection. No mass effect or midline shift. No hydrocephalus. Basilar cisterns are patent. Vascular: No hyperdense vessel. Atherosclerotic calcifications are present within the cavernous internal carotid arteries. Skull: No acute fracture or focal lesion. Sinuses/Orbits: Bilateral maxillary sinus mucosal thickening. Otherwise paranasal sinuses and mastoid air cells are clear. Right orbital ocular implant. Otherwise the orbits are unremarkable. Other: None. IMPRESSION: No acute intracranial abnormality. Please note a CT head is not sensitive enough to evaluate for metastases. Please consider MRI  brain with and without contrast to further evaluate metastases given clinical indication/history. Electronically Signed   By: Iven Finn M.D.   On: 05/12/2021 00:00     Wells Guiles, DO 05/12/2021, 7:52 AM PGY-1,  [place] Intern pager: [phone], text pages welcome

## 2021-05-12 NOTE — Plan of Care (Signed)

## 2021-05-12 NOTE — Evaluation (Signed)
 Occupational Therapy Evaluation Patient Details Name: Melissa Case MRN: [id number] DOB: 08-04-1960 Today's Date: 05/12/2021   History of Present Illness Pt is a 60 y/o female who presented with dyspnea and LE edema due to multiple PE and B DVTs. 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 Since seizure, pt has required use of RW for household mobility, experienced falls and required increased assist for ADLs. Pt presents now with deficits in cardiopulmonary tolerance, strength, balance and cognition with new supplemental O2 requirements. Pt min guard using RW but 4/4 DOE with brief bout of mobility in room. Pt requires Min A for UB ADL and Max A for LB ADLs due to deficits. At this time, recommend SNF rehab as pt would be unable to complete ADLs/mobility without consistent physical assist. Pt reports her husband works and her sister plans to return to her own home in [place] soon, so concerned about not having enough assist at home. If increased physical assistance able to be provided at home, may be able to progress to DC with [facility] services.  SpO2 95% on 3 L O2 at rest, desat to 89% with bed mobility. Required increase to 4 L O2 with activity with desat to 83% during minimal activity. HR 113bpm     Recommendations for follow up therapy are one component of a multi-disciplinary discharge planning process, led by the attending physician.  Recommendations may be updated based on patient status, additional functional criteria and insurance authorization.   Follow Up Recommendations  Skilled nursing-short term rehab (<3 hours/day)    Assistance Recommended at Discharge Frequent or constant Supervision/Assistance  Functional Status Assessment  Patient has had a  recent decline in their functional status and demonstrates the ability to make significant improvements in function in a reasonable and predictable amount of time.  Equipment Recommendations  None recommended by OT    Recommendations for Other Services       Precautions / Restrictions Precautions Precautions: Fall;Other (comment) Precaution Comments: monitor O2 (does not wear at baseline) Restrictions Weight Bearing Restrictions: No      Mobility Bed Mobility Overal bed mobility: Needs Assistance Bed Mobility: Supine to Sit     Supine to sit: Min assist;HOB elevated     General bed mobility comments: assist to lift trunk, use of bedrail    Transfers Overall transfer level: Needs assistance Equipment used: Rolling walker (2 wheels) Transfers: Sit to/from Stand Sit to Stand: Min guard           General transfer comment: increased time to rise, cues for hand placement      Balance Overall balance assessment: Needs assistance;History of Falls Sitting-balance support: No upper extremity supported;Feet supported Sitting balance-Leahy Scale: Fair     Standing balance support: Bilateral upper extremity supported;During functional activity Standing balance-Leahy Scale: Poor                             ADL either performed or assessed with clinical judgement   ADL Overall ADL's : Needs assistance/impaired Eating/Feeding: Set up;Sitting   Grooming: Set up;Sitting Grooming Details (indicate cue type and reason): to wipe mouth due to drooling (since surgery involving facial nerve) Upper Body Bathing: Minimal assistance;Sitting   Lower Body Bathing:  Maximal assistance;Sit to/from stand   Upper Body Dressing : Minimal assistance;Sitting   Lower Body Dressing: Maximal assistance;Sit to/from stand Lower Body Dressing Details (indicate cue type and reason): to don socks Toilet Transfer: Min guard;Ambulation;Rolling walker (2 wheels)   Toileting- Clothing  Manipulation and Hygiene: Maximal assistance;Sit to/from stand       Functional mobility during ADLs: Min guard;Rolling walker (2 wheels) General ADL Comments: Pt limited by significant DOE with new O2 requirements, difficult to complete BADLs without physical assist due to endurance deficits     Vision Baseline Vision/History: 1 Wears glasses Ability to See in Adequate Light: 0 Adequate Patient Visual Report: No change from baseline Vision Assessment?: No apparent visual deficits Additional Comments: able to read buttons on tv remote     Perception     Praxis      Pertinent Vitals/Pain Pain Assessment: Faces Faces Pain Scale: Hurts little more Pain Location: back after activity Pain Descriptors / Indicators: Grimacing;Guarding Pain Intervention(s): Monitored during session;Limited activity within patient's tolerance;Repositioned     Hand Dominance Right   Extremity/Trunk Assessment Upper Extremity Assessment Upper Extremity Assessment: Generalized weakness   Lower Extremity Assessment Lower Extremity Assessment: Defer to PT evaluation   Cervical / Trunk Assessment Cervical / Trunk Assessment: Kyphotic   Communication Communication Communication: No difficulties   Cognition Arousal/Alertness: Awake/alert Behavior During Therapy: WFL for tasks assessed/performed;Anxious Overall Cognitive Status: No family/caregiver present to determine baseline cognitive functioning Area of Impairment: Attention;Following commands;Orientation;Safety/judgement;Awareness;Problem solving                 Orientation Level: Disoriented to;Time (reports correct month/year but not day) Current Attention Level: Sustained   Following Commands: Follows one step commands with increased time Safety/Judgement: Decreased awareness of safety;Decreased awareness of deficits Awareness: Intellectual Problem Solving: Requires verbal cues;Slow processing;Difficulty sequencing General Comments:  Pt pleasant, requires cues to redirect with some decreased understanding of medical complexities. Increased anxiety with activity due to SOB and requires cues for calming/breathing techniques     General Comments  SpO2 95% on 3 L O2 at rest, desat to 89% sitting EOB, increased to 4 L O2 for mobility around bedside to recliner with desat to 83%, 4/4 DOE and cues for breathing/calming    Exercises     Shoulder Instructions      Home Living Family/patient expects to be discharged to:: Private residence Living Arrangements: Spouse/significant other Available Help at Discharge: Family Type of Home: House Home Access: Stairs to enter Technical  of Steps: 4 Entrance Stairs-Rails: Left;Right Home Layout: One level     Bathroom Shower/Tub: Teacher, early years/pre: Handicapped height (BSC over toilet)     Home Equipment: Cane - single Barista (2 wheels);Wheelchair - Water quality scientist (4 wheels);BSC/3in1   Additional Comments: Pt's husband works second shift. Pt's sister has been staying for past month to assist pt with ADLs but has to leave to go back to her [employer] home soon. Pt reports husband applied for [facility] assistance but has not heard back yet      Prior Functioning/Environment Prior Level of Function : Needs assist       Physical Assist : Mobility (physical);ADLs (physical) Mobility (physical): Stairs;Gait ADLs (physical): Bathing;Dressing;IADLs Mobility Comments: prior to seizure, pt typically Independent in all mobility though since seizure one year ago, has required use of RW for mobility, w/c assist outside of home; reports at least one recent fall ADLs Comments: Prior to seizure one year ago, pt was completely Independent with ADls, IADLs and  was working full time in a warehouse. After seizure, pt with increased difficulty bathing, dressing and toilet transfers (improved when BSC placed over toilet) and has been sponge bathing rather than  showering.        OT Problem List: Decreased strength;Decreased activity tolerance;Impaired balance (sitting and/or standing);Cardiopulmonary status limiting activity;Decreased knowledge of use of DME or AE;Decreased cognition      OT Treatment/Interventions: Self-care/ADL training;Therapeutic exercise;Energy conservation;DME and/or AE instruction;Therapeutic activities;Patient/family education;Balance training    OT Goals(Current goals can be found in the care plan section) Acute Rehab OT Goals Patient Stated Goal: go home, get [facility] assist OT Goal Formulation: With patient Time For Goal Achievement: 05/26/21 Potential to Achieve Goals: Good ADL Goals Pt Will Perform Lower Body Bathing: with min guard assist;sit to/from stand Pt Will Transfer to Toilet: with set-up;ambulating Additional ADL Goal #1: Pt to increase activity tolerance > 5 min with stable vitals to maximize endurance Additional ADL Goal #2: Pt to demonstrate implementation of pursed lip breathing and calming strategies to reduce SOB during ADLs/mobility  OT Frequency: Min 2X/week   Barriers to D/C:            Co-evaluation              AM-PAC OT "6 Clicks" Daily Activity     Outcome Measure Help from another person eating meals?: A Little Help from another person taking care of personal grooming?: A Little Help from another person toileting, which includes using toliet, bedpan, or urinal?: A Lot Help from another person bathing (including washing, rinsing, drying)?: A Lot Help from another person to put on and taking off regular upper body clothing?: A Little Help from another person to put on and taking off regular lower body clothing?: A Lot 6 Click Score: 15   End of Session Equipment Utilized During Treatment: Gait belt;Rolling walker (2 wheels);Oxygen Nurse Communication: Mobility status  Activity Tolerance: Patient limited by fatigue Patient left: in chair;with call bell/phone within reach;with chair  alarm set  OT Visit Diagnosis: Unsteadiness on feet (R26.81);Other abnormalities of gait and mobility (R26.89);Muscle weakness (generalized) (M62.81)                Time: 6808-8110 OT Time Calculation (min): 30 min Charges:  OT General Charges $OT Visit: 1 Visit OT Evaluation $OT Eval Moderate Complexity: 1 Mod OT Treatments $Self Care/Home Management : 8-22 mins  Malachy Chamber, OTR/L Acute Rehab Services Office: [phone]   Layla Maw 05/12/2021, 8:16 AM

## 2021-05-12 NOTE — Progress Notes (Addendum)
FPTS Brief Progress Note  S: Melissa Case was seen this p.m. reclined in bed comfortably, awake.  Patient does not remember this author from prior night.  She denied any acute concerns.  O: BP 112/60 (BP Location: Left Arm)    Pulse 80    Temp 97.8 F (36.6 C) (Oral)    Resp 20    Ht 5\' 4"  (1.626 m)    SpO2 92%    BMI 30.55 kg/m   Physical Exam Vitals and nursing note reviewed.  Constitutional:      General: She is awake. She is not in acute distress.    Appearance: She is ill-appearing. She is not toxic-appearing or diaphoretic.     Interventions: Nasal cannula in place.     Comments: 3 L nasal cannula  Cardiovascular:     Rate and Rhythm: Normal rate. Rhythm irregular.     Heart sounds: No murmur heard.   No friction rub. No gallop.  Pulmonary:     Effort: Pulmonary effort is normal. No tachypnea, bradypnea or respiratory distress.     Breath sounds: Normal breath sounds. No decreased breath sounds, wheezing, rhonchi or rales.  Skin:    General: Skin is warm and dry.  Neurological:     Mental Status: She is alert.  Psychiatric:        Behavior: Behavior is cooperative.    A/P: PE and bilateral DVT   parotid cancer status postradiation and parotidectomy Patient was seen resting comfortably, currently on 3 L nasal cannula, SPO2 93%.  Noncontrast head CT showed no active bleed, but mild right temporal encephalomalacia vs. vasogenic edema. Heparin per pharmacy Transition to PO anticoagulation Decadron 4 mg q12hr  Orders reviewed. Labs for AM ordered, which was adjusted as needed.   Remainder of plan per day team/daily progress note.  Merrily Brittle, DO 05/12/2021, 1:35 AM PGY-1, [place] [facility] Night Resident  Please page [phone] with questions.

## 2021-05-12 NOTE — Evaluation (Addendum)
Physical Therapy Evaluation Patient Details Name: Melissa Case MRN: [id number] DOB: Jan 10, 1961 Today's Date: 05/12/2021  History of Present Illness  Pt is a 60 y.o. female admitted 05/10/21 with dyspnea, BLE edema. Workup for acute hypoxic respiratory failure due to PE, bilateral DVTs. PMH includes parotid cancer (s/p radiation, resection 2021), seizures, panic attacks.   Clinical Impression  Pt presents with an overall decrease in functional mobility secondary to above. PTA, pt reports ambulatory with intermittent use of RW; pt's sister has been staying with pt to assist with ADLs and household tasks as needed (since pt's husband works), but she plans to return home soon. Educ on precautions, positioning, therex, and importance of mobility. Today, pt tolerating brief bouts of standing activity with RW and minA; at one point, pt requiring maxA+2 to stand from low toilet height. Pt extremely limited by anxiousness regarding SOB with mobility; requires frequent redirection and cues for breathing/calming. Pt would benefit from SNF-level therapies to maximize functional mobility and independence prior to return home; pt reports she and husband in agreement.     SpO2 down to 84% on 3L O2 (when pleth reliable); DOE 4/4 with activity    Recommendations for follow up therapy are one component of a multi-disciplinary discharge planning process, led by the attending physician.  Recommendations may be updated based on patient status, additional functional criteria and insurance authorization.  Follow Up Recommendations Skilled nursing-short term rehab (<3 hours/day)    Assistance Recommended at Discharge Frequent or constant Supervision/Assistance  Functional Status Assessment Patient has had a recent decline in their functional status and demonstrates the ability to make significant improvements in function in a reasonable and predictable amount of time.  Equipment Recommendations   (TBD)     Recommendations for Other Services       Precautions / Restrictions Precautions Precautions: Fall;Other (comment) Precaution Comments: monitor O2 (does not wear at baseline) Restrictions Weight Bearing Restrictions: No      Mobility  Bed Mobility               General bed mobility comments: Received sitting in recliner    Transfers Overall transfer level: Needs assistance Equipment used: Rolling walker (2 wheels) Transfers: Sit to/from Stand Sit to Stand: Min assist;Max assist;+2 physical assistance           General transfer comment: Cues for hand placement, minA for trunk elevation standing from recliner to RW; pt unable to stand from low toilet height with assist+1 (difficult to determine if more related to anxiety regarding SOB/situation or true weakness on low toilet), requiring maxA+2 from PT and NT to stand    Ambulation/Gait Ambulation/Gait assistance: Min assist Gait Distance (Feet): 12 Feet Assistive device: Rolling walker (2 wheels) Gait Pattern/deviations: Step-through pattern;Decreased stride length;Trunk flexed Gait velocity: Decreased     General Gait Details: Slow, labored gait to/from bathroom with RW and minA for stability; pt requiring frequent cues for breathing/relaxation  Stairs            Wheelchair Mobility    Modified Rankin (Stroke Patients Only)       Balance Overall balance assessment: Needs assistance;History of Falls Sitting-balance support: No upper extremity supported;Feet supported Sitting balance-Leahy Scale: Fair Sitting balance - Comments: able to perform pericare sitting on toilet   Standing balance support: Bilateral upper extremity supported;During functional activity;Reliant on assistive device for balance Standing balance-Leahy Scale: Poor Standing balance comment: reliant on RW  Pertinent Vitals/Pain Pain Assessment: Faces Faces Pain Scale: Hurts a little  bit Pain Location: Generalized Pain Descriptors / Indicators: Tiring Pain Intervention(s): Monitored during session;Limited activity within patient's tolerance    Home Living Family/patient expects to be discharged to:: Private residence Living Arrangements: Spouse/significant other Available Help at Discharge: Family;Available PRN/intermittently Type of Home: House Home Access: Stairs to enter Entrance Stairs-Rails: Chemical engineer of Steps: 4   Home Layout: One level Home Equipment: Cane - single Barista (2 wheels);Wheelchair - Water quality scientist (4 wheels);BSC/3in1 Additional Comments: Pt's husband works second shift. Pt's sister has been staying for past month to assist pt with ADLs but has to leave to go back to her OBX home soon. Pt reports husband applied for [facility] assistance but has not heard back yet    Prior Function Prior Level of Function : Needs assist       Physical Assist : Mobility (physical);ADLs (physical) Mobility (physical): Stairs;Gait ADLs (physical): Bathing;Dressing;IADLs Mobility Comments: prior to seizure, pt typically Independent in all mobility though since seizure one year ago, has required intermittent use of RW for mobility, w/c assist outside of home; reports at least one recent fall ADLs Comments: Prior to seizure one year ago, pt was completely Independent with ADls, IADLs and was working full time in a warehouse. After seizure, pt with increased difficulty bathing, dressing and toilet transfers (improved when BSC placed over toilet) and has been sponge bathing rather than showering.     Hand Dominance   Dominant Hand: Right    Extremity/Trunk Assessment   Upper Extremity Assessment Upper Extremity Assessment: Generalized weakness    Lower Extremity Assessment Lower Extremity Assessment: Generalized weakness    Cervical / Trunk Assessment Cervical / Trunk Assessment: Kyphotic  Communication   Communication: No  difficulties  Cognition Arousal/Alertness: Awake/alert Behavior During Therapy: Anxious Overall Cognitive Status: No family/caregiver present to determine baseline cognitive functioning Area of Impairment: Attention;Following commands;Safety/judgement;Awareness;Problem solving                   Current Attention Level: Sustained   Following Commands: Follows one step commands with increased time Safety/Judgement: Decreased awareness of safety;Decreased awareness of deficits Awareness: Intellectual Problem Solving: Requires verbal cues;Slow processing;Difficulty sequencing General Comments: Pleasant but significant anxiety with SOB related to mobility, requires frequent, repeated redirection to task and cues for relaxation        General Comments General comments (skin integrity, edema, etc.): Difficulty getting reliable pulse ox reading, reading 84% on 3L O2 [place] with activity (reliable pleth), increasing to 88% on 3L with seated rest on toilet to void (reliable pleth). Pt anxious regarding SOB requiring frequent cues for breathing/calming    Exercises     Assessment/Plan    PT Assessment Patient needs continued PT services  PT Problem List Decreased strength;Decreased activity tolerance;Decreased balance;Decreased mobility;Decreased cognition;Decreased safety awareness;Cardiopulmonary status limiting activity       PT Treatment Interventions DME instruction;Gait training;Stair training;Functional mobility training;Therapeutic activities;Therapeutic exercise;Balance training;Patient/family education;Cognitive remediation    PT Goals (Current goals can be found in the Care Plan section)  Acute Rehab PT Goals Patient Stated Goal: "Get my oxygen back"; pt reports she and husband agreeable to SNF PT Goal Formulation: With patient Time For Goal Achievement: 05/26/21 Potential to Achieve Goals: Good    Frequency Min 2X/week   Barriers to discharge        Co-evaluation                AM-PAC PT "6 Clicks" Mobility  Outcome Measure Help needed turning from your back to your side while in a flat bed without using bedrails?: A Little Help needed moving from lying on your back to sitting on the side of a flat bed without using bedrails?: A Little Help needed moving to and from a bed to a chair (including a wheelchair)?: A Little Help needed standing up from a chair using your arms (e.g., wheelchair or bedside chair)?: Total Help needed to walk in hospital room?: A Little Help needed climbing 3-5 steps with a railing? : A Lot 6 Click Score: 15    End of Session Equipment Utilized During Treatment: Gait belt;Oxygen Activity Tolerance: Patient tolerated treatment well;Other (comment) (limited by anxiety/SOB) Patient left: in chair;with call bell/phone within reach;with chair alarm set Nurse Communication: Mobility status PT Visit Diagnosis: Other abnormalities of gait and mobility (R26.89);Muscle weakness (generalized) (M62.81)    Time: 6967-8938 PT Time Calculation (min) (ACUTE ONLY): 20 min   Charges:   PT Evaluation $PT Eval Moderate Complexity: [place], PT, DPT Acute Rehabilitation Services  Pager [phone] Office [place] 05/12/2021, 1:03 PM

## 2021-05-12 NOTE — Plan of Care (Signed)
°  Problem: Education: Goal: Knowledge of General Education information will improve Description: Including pain rating scale, medication(s)/side effects and non-pharmacologic comfort measures 05/12/2021 0048 by Trellis Moment, RN Outcome: Progressing 05/12/2021 0047 by Trellis Moment, RN Outcome: Progressing   Problem: Health Behavior/Discharge Planning: Goal: Ability to manage health-related needs will improve 05/12/2021 0048 by Trellis Moment, RN Outcome: Progressing 05/12/2021 0047 by Trellis Moment, RN Outcome: Progressing   Problem: Clinical Measurements: Goal: Ability to maintain clinical measurements within normal limits will improve 05/12/2021 0048 by Trellis Moment, RN Outcome: Progressing 05/12/2021 0047 by Trellis Moment, RN Outcome: Progressing Goal: Will remain free from infection 05/12/2021 0048 by Trellis Moment, RN Outcome: Progressing 05/12/2021 0047 by Trellis Moment, RN Outcome: Progressing Goal: Diagnostic test results will improve 05/12/2021 0048 by Trellis Moment, RN Outcome: Progressing 05/12/2021 0047 by Trellis Moment, RN Outcome: Progressing Goal: Respiratory complications will improve 05/12/2021 0048 by Trellis Moment, RN Outcome: Progressing 05/12/2021 0047 by Trellis Moment, RN Outcome: Progressing Goal: Cardiovascular complication will be avoided 05/12/2021 0048 by Trellis Moment, RN Outcome: Progressing 05/12/2021 0047 by Trellis Moment, RN Outcome: Progressing   Problem: Activity: Goal: Risk for activity intolerance will decrease 05/12/2021 0048 by Trellis Moment, RN Outcome: Progressing 05/12/2021 0047 by Trellis Moment, RN Outcome: Progressing   Problem: Nutrition: Goal: Adequate nutrition will be maintained 05/12/2021 0048 by Trellis Moment, RN Outcome: Progressing 05/12/2021 0047 by Trellis Moment, RN Outcome: Progressing   Problem: Coping: Goal: Level of anxiety will decrease 05/12/2021 0048 by Trellis Moment, RN Outcome: Progressing 05/12/2021 0047 by Trellis Moment, RN Outcome: Progressing   Problem: Elimination: Goal: Will not experience complications related to bowel motility 05/12/2021 0048 by Trellis Moment, RN Outcome: Progressing 05/12/2021 0047 by Trellis Moment, RN Outcome: Progressing Goal: Will not experience complications related to urinary retention 05/12/2021 0048 by Trellis Moment, RN Outcome: Progressing 05/12/2021 0047 by Trellis Moment, RN Outcome: Progressing   Problem: Pain Managment: Goal: General experience of comfort will improve 05/12/2021 0048 by Trellis Moment, RN Outcome: Progressing 05/12/2021 0047 by Trellis Moment, RN Outcome: Progressing   Problem: Safety: Goal: Ability to remain free from injury will improve 05/12/2021 0048 by Trellis Moment, RN Outcome: Progressing 05/12/2021 0047 by Trellis Moment, RN Outcome: Progressing   Problem: Skin Integrity: Goal: Risk for impaired skin integrity will decrease 05/12/2021 0048 by Trellis Moment, RN Outcome: Progressing 05/12/2021 0047 by Trellis Moment, RN Outcome: Progressing

## 2021-05-12 NOTE — Progress Notes (Signed)
[place] for Heparin Indication: pulmonary embolus and DVT  Allergies  Allergen Reactions   Morphine Itching   Penicillins Rash    Has patient had a PCN reaction causing immediate rash, facial/tongue/throat swelling, SOB or lightheadedness with hypotension: No Has patient had a PCN reaction causing severe rash involving mucus membranes or skin necrosis: No Has patient had a PCN reaction that required hospitalization No Has patient had a PCN reaction occurring within the last 10 years: No If all of the above answers are "NO", then may proceed with Cephalosporin use.     Patient Measurements: Height: 5\' 4"  (162.6 cm) IBW/kg (Calculated) : 54.7 Weight 80.7 kg Heparin Dosing Weight: 74 kg  Vital Signs: Temp: 98.5 F (36.9 C) (12/22 0735) Temp Source: Oral (12/22 0735) BP: 117/75 (12/22 0735) Pulse Rate: 78 (12/22 0735)  Labs: Recent Labs    05/10/21 1030 05/10/21 1500 05/11/21 0108 05/11/21 1126 05/11/21 2123 05/12/21 0113  HGB 11.8*  --  11.1*  --   --  9.6*  HCT 36.7  --  33.9*  --   --  29.4*  PLT 152  --  131*  --   --  122*  APTT  --   --   --  158* 90* 98*  HEPARINUNFRC  --   --   --  >1.10*  --  0.76*  CREATININE 0.54  --  0.65  --   --   --   TROPONINIHS 11 14  --   --   --   --      Estimated Creatinine Clearance: 76.9 mL/min (by C-G formula based on SCr of 0.65 mg/dL).  Assessment: 60 yo F with acute PE and bilateral DVT.  Pt received Apixaban 12/20 at 1615 then was transitioned to heparin in the event that procedures were needed.  aPTT therapeutic at 98 sec on heparin 1000 units/hr; no bleeding reported.  Goal of Therapy:  Heparin level 0.3-0.7 units/ml aPTT 66-102 seconds Monitor platelets by anticoagulation protocol: Yes   Plan:  Continue heparin gtt at 1000 units/hr Check check heparin level/aPTT daily while on heparin Continue to monitor H&H and platelets F/u transition back to DOAC    Thank you for  allowing [place] to participate in this patients care. Jens Som, PharmD 05/12/2021 7:42 AM  **Pharmacist phone directory can be found on [email or web address] listed under [place]**

## 2021-05-13 DIAGNOSIS — I2699 Other pulmonary embolism without acute cor pulmonale: Secondary | ICD-10-CM | POA: Diagnosis not present

## 2021-05-13 DIAGNOSIS — I824Y3 Acute embolism and thrombosis of unspecified deep veins of proximal lower extremity, bilateral: Secondary | ICD-10-CM | POA: Diagnosis not present

## 2021-05-13 LAB — CBC
HCT: 26.6 % — ABNORMAL LOW (ref 36.0–46.0)
Hemoglobin: 9 g/dL — ABNORMAL LOW (ref 12.0–15.0)
MCH: 33.7 pg (ref 26.0–34.0)
MCHC: 33.8 g/dL (ref 30.0–36.0)
MCV: 99.6 fL (ref 80.0–100.0)
Platelets: 132 10*3/uL — ABNORMAL LOW (ref 150–400)
RBC: 2.67 MIL/uL — ABNORMAL LOW (ref 3.87–5.11)
RDW: 15.8 % — ABNORMAL HIGH (ref 11.5–15.5)
WBC: 4.4 10*3/uL (ref 4.0–10.5)
nRBC: 0.5 % — ABNORMAL HIGH (ref 0.0–0.2)

## 2021-05-13 NOTE — Discharge Instructions (Addendum)
Thank you for allowing [place] to participate in your care!    You were admitted for clots in both of your legs.  He also had a blood clot in your lungs.  You were started on a blood thinner called Eliquis and should take 10 mg (2 tablets) twice daily until 12/28.  On 12/29 you should decrease to 5 mg (1 tablet) twice daily.  While you were here you required oxygen and so we have arranged for you to have this at home.  We also set up outpatient physical therapy which will come to your home to perform the physical therapy.  I would like for you to follow-up with your primary care provider in the next week or 2 for repeat blood work.  If your shortness of breath worsens, you have any chest pain or any other concerns please return for evaluation in the emergency department.  I hope you have a Merry Christmas!  If you experience worsening of your admission symptoms, develop shortness of breath, life threatening emergency, suicidal or homicidal thoughts you must seek medical attention immediately by calling 911 or calling your MD immediately  if symptoms less severe.   Information on my medicine - ELIQUIS (apixaban)  This medication education was reviewed with me or my healthcare representative as part of my discharge preparation.  The pharmacist that spoke with me during my hospital stay was:  Einar Grad, [facility]  Why was Eliquis prescribed for you? Eliquis was prescribed to treat blood clots that may have been found in the veins of your legs (deep vein thrombosis) or in your lungs (pulmonary embolism) and to reduce the risk of them occurring again.  What do You need to know about Eliquis ? The starting dose is 10 mg (two 5 mg tablets) taken TWICE daily for the FIRST SEVEN (7) DAYS, then on (enter date)  05/19/21  the dose is reduced to ONE 5 mg tablet taken TWICE daily.  Eliquis may be taken with or without food.   Try to take the dose about the same time in the morning and in the evening. If you  have difficulty swallowing the tablet whole please discuss with your pharmacist how to take the medication safely.  Take Eliquis exactly as prescribed and DO NOT stop taking Eliquis without talking to the doctor who prescribed the medication.  Stopping may increase your risk of developing a new blood clot.  Refill your prescription before you run out.  After discharge, you should have regular check-up appointments with your healthcare provider that is prescribing your Eliquis.    What do you do if you miss a dose? If a dose of ELIQUIS is not taken at the scheduled time, take it as soon as possible on the same day and twice-daily administration should be resumed. The dose should not be doubled to make up for a missed dose.  Important Safety Information A possible side effect of Eliquis is bleeding. You should call your healthcare provider right away if you experience any of the following: Bleeding from an injury or your nose that does not stop. Unusual colored urine (red or dark brown) or unusual colored stools (red or black). Unusual bruising for unknown reasons. A serious fall or if you hit your head (even if there is no bleeding).  Some medicines may interact with Eliquis and might increase your risk of bleeding or clotting while on Eliquis. To help avoid this, consult your healthcare provider or pharmacist prior to using any new  prescription or non-prescription medications, including herbals, vitamins, non-steroidal anti-inflammatory drugs (NSAIDs) and supplements.  This website has more information on Eliquis (apixaban): http://[email or web address].com/eliquis/home

## 2021-05-13 NOTE — Progress Notes (Signed)
FPTS Interim Progress Note  S:Resting comfortably.   O: BP (!) 96/54 (BP Location: Left Arm)    Pulse 80    Temp 97.9 F (36.6 C) (Oral)    Resp 16    Ht 5\' 4"  (1.626 m)    SpO2 96%    BMI 30.55 kg/m   General: In no distress, resting Resp: Normal resp effort, Leslie in place  A/P: Acute hypoxic respiratory failure 2/2 bilateral PE without right heart strain Maintaining appropriate SpO2 with [place] in place on 2L. - Continue pulse ox and cardiac monitoring - Eliquis 10mg  BID - Orders reviewed. Labs for AM placed.  Orvis Brill, DO 05/13/2021, 11:17 PM PGY-1, [place] Medicine Service pager [phone]

## 2021-05-13 NOTE — Progress Notes (Signed)
Mobility Specialist Progress Note    05/13/21 1310  Mobility  Activity Ambulated to bathroom  Level of Assistance +2 (takes two people)  Games developer wheel walker  Distance Ambulated (ft) 15 ft  Mobility Out of bed for toileting  Mobility Response Tolerated poorly  Mobility performed by Mobility specialist  Bed Position Chair  $Mobility charge 1 Mobility   Pt received in bed and agreeable to mobility but needed to attempt BM. Pt was ModA STS from EOB. While attempting BM pt stated she could not handle a walk and wanted to go back to bed. Convinced pt to get to chair and pt was +2 to come up from toilet seat and move to chair as she started to panic that her oxygen level was going down. Encouraged pt to focus on pursed lip breathing to recover. Activity completed on 2LO2 and pt did desat while moving to and from BR. Left in chair with call bell in reach and friend present in room.   [facility] Medical Mobility Specialist  M.S. Primary Phone: [phone] M.S. Secondary Phone: [phone]

## 2021-05-13 NOTE — Progress Notes (Addendum)
Family Medicine Teaching Service Daily Progress Note Intern Pager: [phone]  Patient name: Melissa Case Medical record number: [id number] Date of birth: [birth year] Age: 60 y.o. Gender: female  Primary Care Provider: Camillia Herter, NP Consultants: None Code Status: Full Code  Pt Overview and Major Events to Date:  12/20: Admitted to [facility] 12/22: Transitioned to Eliquis  Assessment and Plan: Melissa Case is a 60 year old female who presented with SOB and LE edema found to have bilateral PE without definitive right heart strain and DVTs. PMH significant for parotid cancer s/p radiation and resection, GERD, panic attacks, seizures.  Acute hypoxic resp failure 2/2 bilateral PE without right heart strain Stable respiratory status overnight, on 3L O2 via [place]. Transitioned from Heparin to DOAC, on loading dose. Need to repeat ambulation with pulse ox to assess for O2 requirement. She is stable for SNF transfer - Continue Eliquis 10mg  BID (day #3/7) and transition to 5mg  BID on 12/29 - Continue pulse ox, cardiac monitoring - Appreciate PT/OT recommendations  Anemia, likely of chronic disease Hgb stable, 8.7 from 9.0 yesterday. Thrombocytopenia improving, plt 152 today. - Continue to monitor with labs  FEN/GI: Carb modified PPx: Eliquis 10mg  BID until 12/29, then 5mg  BID Dispo:SNF  pending placement . Patient is stable for discharge  Subjective:  Feels breathing is okay. Eating and drinking normally. Says she has been getting up into the chair with assistance. Was sleeping comfortably when I entered room this morning.  Objective: Temp:  [97.4 F (36.3 C)-98 F (36.7 C)] 97.9 F (36.6 C) (12/23 2016) Pulse Rate:  [72-80] 80 (12/23 2016) Resp:  [16-20] 16 (12/23 2016) BP: (96-112)/(54-61) 96/54 (12/23 2016) SpO2:  [90 %-98 %] 96 % (12/23 2016) Physical Exam: General: No distress, alert and oriented, resting in bed but awakens for my exam Cardiovascular: Regular rate, normal sinus  rhythm Respiratory: No increased work of breathing, maintaining normal SpO2 with 3L  in place. Fine bibasilar crackles Extremities: 2+ pitting edema BL lower extremities  Laboratory: Recent Labs  Lab 05/12/21 0113 05/12/21 1050 05/13/21 0109  WBC 5.6 5.4 4.4  HGB 9.6* 9.9* 9.0*  HCT 29.4* 30.1* 26.6*  PLT 122* 135* 132*   Recent Labs  Lab 05/10/21 1030 05/11/21 0108  NA 133* 134*  K 3.6 3.7  CL 97* 94*  CO2 23 28  BUN 13 12  CREATININE 0.54 0.65  CALCIUM 8.4* 8.3*  PROT 5.7*  --   BILITOT 0.5  --   ALKPHOS 40  --   ALT 41  --   AST 25  --   GLUCOSE 148* 119*    Imaging/Diagnostic Tests: No results found.   Orvis Brill, DO 05/13/2021, 9:33 PM PGY-1, [place] Intern pager: [phone], text pages welcome

## 2021-05-13 NOTE — Progress Notes (Signed)
FPTS Brief Progress Note  S: Melissa Case was seen this p.m. sleeping comfortably.  Did not awaken patient.  O: BP (!) 104/56 (BP Location: Right Arm)    Pulse 77    Temp (!) 97.4 F (36.3 C) (Oral)    Resp 20    Ht 5\' 4"  (1.626 m)    SpO2 96%    BMI 30.55 kg/m   Physical Exam Vitals and nursing note reviewed.  Constitutional:      General: She is sleeping. She is not in acute distress.    Appearance: She is ill-appearing. She is not toxic-appearing or diaphoretic.     Interventions: Nasal cannula in place.  Cardiovascular:     Rate and Rhythm: Normal rate and regular rhythm.  Pulmonary:     Effort: Pulmonary effort is normal. No respiratory distress.    A/P: Acute hypoxic respiratory failure 2/2 PE   bilateral DVT SpO2 96 % on 3 L nasal cannula.  Continuous pulse ox Cardiac monitoring Eliquis 10 mg BID x7 days (start 12/22), then Eliquis 5 mg BID Orders reviewed. Labs for AM ordered, which was adjusted as needed.   Remainder of plan per day team/daily progress note.  Merrily Brittle, DO 05/13/2021, 12:51 AM PGY-1, [place] [facility] Night Resident  Please page [phone] with questions.

## 2021-05-13 NOTE — NC FL2 (Signed)
[place] LEVEL OF CARE SCREENING TOOL     IDENTIFICATION  Patient Name: Melissa Case Birthdate: [birth year] Sex: female Admission Date (Current Location): 05/10/2021  [facility] and [place] Number:  Herbalist and Address:  [facility] [place] [place] [address] [place], [place] [address]      Provider Number: [id number]  Attending Physician Name and Address:  McDiarmid, Blane Ohara, MD  Relative Name and Phone Number:  Deone, Omahoney (Spouse)   [phone]    Current Level of Care: Hospital Recommended Level of Care: [place] Prior Approval Number:    Date Approved/Denied:   PASRR Number: [id number]  Discharge Plan: SNF    Current Diagnoses: Patient Active Problem List   Diagnosis Date Noted   Acute respiratory failure with hypoxia [facility])    Pulmonary emboli ([place]) 05/11/2021   DVT (deep venous thrombosis) ([place]) 05/10/2021   Pulmonary embolism ([place]) 05/10/2021   Prediabetes 03/04/2021   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   Cerebral edema ([place]) 12/30/2020   Seizure ([place]) 11/17/2020   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   Lesion of parotid gland 07/14/2019   Acute calculous cholecystitis 11/21/2017   Gastro-esophageal reflux disease without esophagitis 02/03/2014   Abdominal pain 09/30/2013   SBO (small bowel obstruction) ([place]) 09/30/2013   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/05/2012   Gall stone 04/05/2012   Barrett's esophagus 04/05/2012   Cholelithiasis 03/27/2012   Hyperlipidemia 03/25/2012   Depression 09/15/2011   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    Orientation RESPIRATION BLADDER Height & Weight     Self, Time, Situation, Place  O2 Continent, External catheter Weight:   Height:  5\' 4"  (162.6 cm)  BEHAVIORAL SYMPTOMS/MOOD NEUROLOGICAL BOWEL NUTRITION STATUS      Continent Diet (See DC Summary)  AMBULATORY STATUS COMMUNICATION OF  NEEDS Skin   Extensive Assist Verbally Surgical wounds                       Personal Care Assistance Level of Assistance  Bathing, Feeding, Dressing Bathing Assistance: Maximum assistance Feeding assistance: Independent Dressing Assistance: Maximum assistance     Functional Limitations Info  Sight, Hearing, Speech Sight Info: Impaired Hearing Info: Adequate Speech Info: Adequate    SPECIAL CARE FACTORS FREQUENCY  PT (By licensed PT), OT (By licensed OT)     PT Frequency: 5x a week OT Frequency: 5x a week            Contractures Contractures Info: Not present    Additional Factors Info  Code Status, Allergies Code Status Info: Full Allergies Info: Morphine   Penicillins           Current Medications (05/13/2021):  This is the current hospital active medication list Current Facility-Administered Medications  Medication Dose Route Frequency Provider Last Rate Last Admin   apixaban (ELIQUIS) tablet 10 mg  10 mg Oral BID Erskine Emery, MD   10 mg at 05/13/21 0913   Followed by   Derrill Memo ON 05/19/2021] apixaban (ELIQUIS) tablet 5 mg  5 mg Oral BID Erskine Emery, MD       dexamethasone (DECADRON) tablet 4 mg  4 mg Oral Q12H Wells Guiles, DO   4 mg at 05/13/21 0914   divalproex (DEPAKOTE) DR tablet 750 mg  750 mg Oral BID Wells Guiles, DO   750 mg at 05/13/21 3491   Gerhardt's butt cream   Topical  TID Martyn Malay, MD   Given at 05/13/21 [phone]   levETIRAcetam (KEPPRA) tablet 500 mg  500 mg Oral BID Wells Guiles, DO   500 mg at 05/13/21 0102   nicotine (NICODERM CQ - dosed in mg/24 hr) patch 7 mg  7 mg Transdermal Daily Erskine Emery, MD   7 mg at 05/13/21 0916   pantoprazole (PROTONIX) EC tablet 40 mg  40 mg Oral Daily Wells Guiles, DO   40 mg at 05/13/21 7253   PARoxetine (PAXIL) tablet 40 mg  40 mg Oral Daily Wells Guiles, DO   40 mg at 05/13/21 6644     Discharge Medications: Please see discharge summary for a list of discharge  medications.  Relevant Imaging Results:  Relevant Lab Results:   Additional Information SSN: [id number]; [place] COVID-19 Vaccine 09/15/2019 , 08/22/2019  Reece Agar, LCSWA

## 2021-05-13 NOTE — Plan of Care (Signed)

## 2021-05-13 NOTE — Progress Notes (Addendum)
Family Medicine Teaching Service Daily Progress Note Intern Pager: [phone]  Patient name: Melissa Case Medical record number: [id number] Date of birth: October 30, 1960 Age: 60 y.o. Gender: female  Primary Care Provider: Camillia Herter, NP Consultants: None Code Status: Full Code   Pt Overview and Major Events to Date:  12/20: Admitted to [place] 12/22: Transitioned to Eliquis  Assessment and Plan: [name] is a 60 y.o. female presented with PE and b/l DVTs. PMHx of paroitd cancer s/p radiation and resection (2021), GERD, panic attacks, and seizures. HD 2   Acute hypoxic respiratory failure 2/2 PE   b/l DVTs Patient was stable on 3L O2 [place] overnight.  We did patient from 3 L to 2 L this a.m., and she was stable at SpO2 >95% at rest and moving around in bed. Will repeat walk test today as her SpO2 dropped to low 80s% with activity. PT/OT, appreciate assistance Continuous cardiac monitoring Eliquis 10 mg BID day 2/7 (start 12/22), followed by Eliquis 5 mg BID F/u ambulatory pulse ox   FEN/GI: Carb modified  PPx: apixaban (ELIQUIS) tablet 10 mg  apixaban (ELIQUIS) tablet 5 mg   Dispo:SNF pending placement. Patient is STABLE for discharge.   Subjective:  Melissa Case was seen this AM. Stated that she feels good that her oxygen is really helping her today.  She did note that she was short of breath when working with PT yesterday.  Objective: Temp:  [97.4 F (36.3 C)-98.5 F (36.9 C)] 97.4 F (36.3 C) (12/23 0523) Pulse Rate:  [72-86] 72 (12/23 0523) Resp:  [16-20] 16 (12/23 0523) BP: (103-147)/(56-82) 103/58 (12/23 0523) SpO2:  [92 %-98 %] 98 % (12/23 0523) Physical Exam Vitals and nursing note reviewed.  Constitutional:      General: She is awake. She is not in acute distress.    Appearance: She is ill-appearing. She is not toxic-appearing or diaphoretic.     Interventions: Nasal cannula in place.  Cardiovascular:     Rate and Rhythm: Normal rate and regular rhythm.      Heart sounds: No murmur heard.   No friction rub. No gallop.  Pulmonary:     Effort: Pulmonary effort is normal. No tachypnea, bradypnea or respiratory distress.     Breath sounds: Normal breath sounds. No decreased breath sounds, wheezing, rhonchi or rales.  Skin:    General: Skin is warm and dry.  Neurological:     Mental Status: She is alert and oriented to person, place, and time.  Psychiatric:        Behavior: Behavior is cooperative.    Laboratory: Recent Labs  Lab 05/12/21 0113 05/12/21 1050 05/13/21 0109  WBC 5.6 5.4 4.4  HGB 9.6* 9.9* 9.0*  HCT 29.4* 30.1* 26.6*  PLT 122* 135* 132*   Recent Labs  Lab 05/10/21 1030 05/11/21 0108  NA 133* 134*  K 3.6 3.7  CL 97* 94*  CO2 23 28  BUN 13 12  CREATININE 0.54 0.65  CALCIUM 8.4* 8.3*  PROT 5.7*  --   BILITOT 0.5  --   ALKPHOS 40  --   ALT 41  --   AST 25  --   GLUCOSE 148* 119*   Results for orders placed or performed during the hospital encounter of 05/10/21 (from the past 24 hour(s))  CBC     Status: Abnormal   Collection Time: 05/12/21 10:50 AM  Result Value Ref Range   WBC 5.4 4.0 - 10.5 K/uL   RBC 2.98 (L) 3.87 - 5.11  MIL/uL   Hemoglobin 9.9 (L) 12.0 - 15.0 g/dL   HCT 30.1 (L) 36.0 - 46.0 %   MCV 101.0 (H) 80.0 - 100.0 fL   MCH 33.2 26.0 - 34.0 pg   MCHC 32.9 30.0 - 36.0 g/dL   RDW 15.9 (H) 11.5 - 15.5 %   Platelets 135 (L) 150 - 400 K/uL   nRBC 0.0 0.0 - 0.2 %  CBC     Status: Abnormal   Collection Time: 05/13/21  1:09 AM  Result Value Ref Range   WBC 4.4 4.0 - 10.5 K/uL   RBC 2.67 (L) 3.87 - 5.11 MIL/uL   Hemoglobin 9.0 (L) 12.0 - 15.0 g/dL   HCT 26.6 (L) 36.0 - 46.0 %   MCV 99.6 80.0 - 100.0 fL   MCH 33.7 26.0 - 34.0 pg   MCHC 33.8 30.0 - 36.0 g/dL   RDW 15.8 (H) 11.5 - 15.5 %   Platelets 132 (L) 150 - 400 K/uL   nRBC 0.5 (H) 0.0 - 0.2 %    Imaging/Diagnostic Tests: No results found.   Merrily Brittle, DO 05/13/2021, 6:53 AM PGY-1, [place] Intern pager:  [phone], text pages welcome

## 2021-05-14 ENCOUNTER — Encounter (HOSPITAL_COMMUNITY): Payer: Self-pay | Admitting: Family Medicine

## 2021-05-14 LAB — CBC
HCT: 27 % — ABNORMAL LOW (ref 36.0–46.0)
Hemoglobin: 8.7 g/dL — ABNORMAL LOW (ref 12.0–15.0)
MCH: 33 pg (ref 26.0–34.0)
MCHC: 32.2 g/dL (ref 30.0–36.0)
MCV: 102.3 fL — ABNORMAL HIGH (ref 80.0–100.0)
Platelets: 152 10*3/uL (ref 150–400)
RBC: 2.64 MIL/uL — ABNORMAL LOW (ref 3.87–5.11)
RDW: 15.4 % (ref 11.5–15.5)
WBC: 3.9 10*3/uL — ABNORMAL LOW (ref 4.0–10.5)
nRBC: 0 % (ref 0.0–0.2)

## 2021-05-14 MED ORDER — NICOTINE 7 MG/24HR TD PT24: 7.0000 mg | MEDICATED_PATCH | Freq: Every day | TRANSDERMAL | 0 refills | Status: DC

## 2021-05-14 MED ORDER — APIXABAN 5 MG PO TABS: 10.0000 mg | ORAL_TABLET | Freq: Two times a day (BID) | ORAL | 0 refills | Status: DC

## 2021-05-14 MED ORDER — APIXABAN 5 MG PO TABS: 5.0000 mg | ORAL_TABLET | Freq: Two times a day (BID) | ORAL | 0 refills | Status: DC

## 2021-05-14 MED ORDER — GERHARDT'S BUTT CREAM
1.0000 | TOPICAL_CREAM | Freq: Three times a day (TID) | CUTANEOUS | Status: DC
Start: 2021-05-14 — End: 2021-08-15

## 2021-05-14 MED ORDER — DEXAMETHASONE 4 MG PO TABS: 4.0000 mg | ORAL_TABLET | Freq: Two times a day (BID) | ORAL | 0 refills | Status: DC

## 2021-05-14 NOTE — Progress Notes (Signed)
Physical Therapy Treatment Patient Details Name: Melissa Case MRN: [id number] DOB: 1960-06-14 Today's Date: 05/14/2021   History of Present Illness Pt is a 60 y.o. female admitted 05/10/21 with dyspnea, BLE edema. Workup for acute hypoxic respiratory failure due to PE, bilateral DVTs. PMH includes parotid cancer (s/p radiation, resection 2021), seizures, panic attacks.    PT Comments    The pt was able to demo improved mobility this morning. She was able to complete sit-stand transfers with minA to minG only (maxA of 2 at last session), and complete up to 45 ft hallway ambulation with minA, use of RW, cues for PLB, and 4-6L O2. The pt was initially able to maintain Spo2 in 90s with 4L, but as she continued with ambulation, she required 6L to maintain. The pt is eager for d/c home, could return home with constant family assist and home O2.     Recommendations for follow up therapy are one component of a multi-disciplinary discharge planning process, led by the attending physician.  Recommendations may be updated based on patient status, additional functional criteria and insurance authorization.  Follow Up Recommendations  Home health PT     Assistance Recommended at Discharge Frequent or constant Supervision/Assistance  Equipment Recommendations  Rolling walker (2 wheels) (O2)    Recommendations for Other Services       Precautions / Restrictions Precautions Precautions: Fall;Other (comment) Precaution Comments: monitor O2 (does not wear at baseline, 4-6L for ambulation) Restrictions Weight Bearing Restrictions: No     Mobility  Bed Mobility Overal bed mobility: Needs Assistance Bed Mobility: Supine to Sit     Supine to sit: Min guard     General bed mobility comments: use of bed rail, increased time and effort    Transfers Overall transfer level: Needs assistance Equipment used: Rolling walker (2 wheels) Transfers: Sit to/from Stand Sit to Stand: Min assist;Min  guard           General transfer comment: progressed from minA to minG through session with max cues for hand placement    Ambulation/Gait Ambulation/Gait assistance: Min assist Gait Distance (Feet): 45 Feet (+ 15 ft) Assistive device: Rolling walker (2 wheels) Gait Pattern/deviations: Step-through pattern;Decreased stride length;Trunk flexed Gait velocity: Decreased Gait velocity interpretation: <1.31 ft/sec, indicative of household ambulator   General Gait Details: slow gait with heavy reliance on BUE support and inconsistent step length and cadence. frequent standing rest, seated rest after 52ft        Balance Overall balance assessment: Needs assistance;History of Falls Sitting-balance support: No upper extremity supported;Feet supported Sitting balance-Leahy Scale: Fair Sitting balance - Comments: able to perform pericare sitting on toilet   Standing balance support: Bilateral upper extremity supported;During functional activity;Reliant on assistive device for balance Standing balance-Leahy Scale: Poor Standing balance comment: reliant on RW                            Cognition Arousal/Alertness: Awake/alert Behavior During Therapy: Anxious Overall Cognitive Status: No family/caregiver present to determine baseline cognitive functioning Area of Impairment: Attention;Following commands;Safety/judgement;Awareness;Problem solving                   Current Attention Level: Sustained   Following Commands: Follows one step commands with increased time Safety/Judgement: Decreased awareness of safety;Decreased awareness of deficits Awareness: Intellectual Problem Solving: Requires verbal cues;Slow processing;Difficulty sequencing General Comments: pt able to follow cues and instructions but at times needs them repeated to maintain task. also little  carryover from instructions for transfers. increased anxiety with movement        Exercises       General Comments General comments (skin integrity, edema, etc.): VSS on 3L at rest, needs 4-6L to maintain 90% with gait      Pertinent Vitals/Pain Pain Assessment: No/denies pain     PT Goals (current goals can now be found in the care plan section) Acute Rehab PT Goals Patient Stated Goal: to go home PT Goal Formulation: With patient Time For Goal Achievement: 05/26/21 Potential to Achieve Goals: Good Progress towards PT goals: Not progressing toward goals - comment;Progressing toward goals    Frequency    Min 3X/week      PT Plan Discharge plan needs to be updated       AM-PAC PT "6 Clicks" Mobility   Outcome Measure  Help needed turning from your back to your side while in a flat bed without using bedrails?: A Little Help needed moving from lying on your back to sitting on the side of a flat bed without using bedrails?: A Little Help needed moving to and from a bed to a chair (including a wheelchair)?: A Little Help needed standing up from a chair using your arms (e.g., wheelchair or bedside chair)?: A Little Help needed to walk in hospital room?: A Little Help needed climbing 3-5 steps with a railing? : A Lot 6 Click Score: 17    End of Session Equipment Utilized During Treatment: Gait belt;Oxygen Activity Tolerance: Patient tolerated treatment well Patient left: in chair;with call bell/phone within reach;with chair alarm set Nurse Communication: Mobility status PT Visit Diagnosis: Other abnormalities of gait and mobility (R26.89);Muscle weakness (generalized) (M62.81)     Time: 4627-0350 PT Time Calculation (min) (ACUTE ONLY): 22 min  Charges:  $Therapeutic Exercise: 8-22 mins                     West Carbo, PT, DPT   Acute Rehabilitation Department Pager #: [phone]   Sandra Cockayne 05/14/2021, 1:15 PM

## 2021-05-14 NOTE — TOC Transition Note (Signed)
Transition of Care [facility]) - CM/SW Discharge Note   Patient Details  Name: Melissa Case MRN: [id number] Date of Birth: 1961/04/22  Transition of Care [facility]) CM/SW Contact:  Bartholomew Crews, RN Phone Number: [phone] 05/14/2021, 1:47 PM   Clinical Narrative:     Medical necessity for home oxygen. Spoke with spouse on mobile phone. No preference for dme provider. Referral to [employer] for delivery to the room. No further TOC needs identified.   Final next level of care: [place] Barriers to Discharge: No Barriers Identified   Patient Goals and CMS Choice Patient states their goals for this hospitalization and ongoing recovery are:: return home with spouse CMS Medicare.gov Compare Post Acute Care list provided to:: Patient Represenative (must comment) (spouse, Melissa Case) Choice offered to / list presented to : Spouse  Discharge Placement                       Discharge Plan and Services In-house Referral: NA Discharge Planning Services: CM Consult Post Acute Care Choice: Home Health          DME Arranged: Oxygen DME Agency: [employer] Date DME Agency Contacted: 05/14/21 Time DME Agency Contacted: 3662 Representative spoke with at DME Agency: [place]: PT, OT [place] Agency: [place] Date [place]: 05/14/21 Time [place]: 1133 Representative spoke with at [place]: [place] ([place]) Interventions     Readmission Risk Interventions No flowsheet data found.

## 2021-05-14 NOTE — TOC Initial Note (Signed)
Transition of Care [facility]) - Initial/Assessment Note    Patient Details  Name: Melissa Case MRN: [id number] Date of Birth: 25-Jun-1960  Transition [facility]) CM/SW Contact:    Bartholomew Crews, RN Phone Number: [phone] 05/14/2021, 10:48 AM  Clinical Narrative:                  Spoke with spouse, Richard, on his mobile phone to discuss plans for post acute transition. Patient is pending ambulation sat to determine home oxygen needs. Discussed recommendations for [facility] PT/OT. Richard is agreeable - no preference for [facility] agency. Stated neurologist had referred to outpatient PT but the hopes was for home health first then transition to outpatient as patient becomes more mobile. TOC to make referrals for [facility] PT/OT - pending accepting agency. Demographics and PCP verified. Richard to provided transportation home in private vehicle.   Expected Discharge Plan: [place] Barriers to Discharge: No Barriers Identified   Patient Goals and CMS Choice Patient states their goals for this hospitalization and ongoing recovery are:: return home with spouse CMS Medicare.gov Compare Post Acute Care list provided to:: Patient Represenative (must comment) (spouse, Richard) Choice offered to / list presented to : Spouse  Expected Discharge Plan and Services Expected Discharge Plan: [place] In-house Referral: NA Discharge Planning Services: CM Consult Post Acute Care Choice: [place] arrangements for the past 2 months: Single Family Home                 DME Arranged: N/A DME Agency: NA       HH Arranged: PT, OT          Prior Living Arrangements/Services Living arrangements for the past 2 months: Single Family Home Lives with:: Self, Spouse Patient language and need for interpreter reviewed:: Yes        Need for Family Participation in Patient Care: Yes (Comment) Care giver support system in place?: Yes (comment) Current home services: DME  (RW) Criminal Activity/Legal Involvement Pertinent to Current Situation/Hospitalization: No - Comment as needed  Activities of Daily Living   ADL Screening (condition at time of admission) Patient's cognitive ability adequate to safely complete daily activities?: No Is the patient deaf or have difficulty hearing?: Yes Does the patient have difficulty seeing, even when wearing glasses/contacts?: No Does the patient have difficulty concentrating, remembering, or making decisions?: Yes Patient able to express need for assistance with ADLs?: Yes Does the patient have difficulty dressing or bathing?: Yes Does the patient have difficulty walking or climbing stairs?: Yes  Permission Sought/Granted Permission sought to share information with : Family Supports Permission granted to share information with : Yes, Verbal Permission Granted  Share Information with NAME: Juanda Crumble     Permission granted to share info w Relationship: spouse  Permission granted to share info w Contact Information: [phone]  Emotional Assessment         Alcohol / Substance Use: Not Applicable Psych Involvement: No (comment)  Admission diagnosis:  Pulmonary embolism ([place]) [I26.99] DVT (deep venous thrombosis) (HCC) [I82.409] Acute respiratory failure with hypoxia ([place]) [J96.01] Deep vein thrombosis (DVT) of both lower extremities, unspecified chronicity, unspecified vein ([place]) [I82.403] Pulmonary emboli (HCC) [I26.99] Patient Active Problem List   Diagnosis Date Noted   Acute respiratory failure with hypoxia ([place])    Pulmonary emboli ([place]) 05/11/2021   DVT (deep venous thrombosis) ([place]) 05/10/2021   Pulmonary embolism ([place]) 05/10/2021   Prediabetes 03/04/2021   Mass of temporal lobe  01/01/2021   Cerebral edema ([place]) 12/30/2020   Seizure ([place]) 11/17/2020   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 [place] ([place]) [place] [address] Phone: [phone] Fax: [phone]     Social Determinants of Health (SDOH) Interventions    Readmission Risk Interventions No flowsheet data found.

## 2021-05-14 NOTE — Progress Notes (Signed)
Four oxygen tanks delivered  by [employer] for home use given to pt and husband.

## 2021-05-14 NOTE — Progress Notes (Signed)
Pt and family requested discharge to home instead of SNF. Pt's sister, Melissa Case, has been helping take care of her at her home and wants to continue to care for her at home. Pt's husband, Melissa Case, was unaware of any discharge plans and has requested to be called when providers come into Pt's room. His phone number is [phone]. Day shift nurse will be informed at shift change.

## 2021-05-14 NOTE — Progress Notes (Deleted)
All set for discharge home, discharge instructions given to pt. Awaiting ride home. 

## 2021-05-14 NOTE — Progress Notes (Signed)
SATURATION QUALIFICATIONS: (This note is used to comply with regulatory documentation for home oxygen)  Patient Saturations on Room Air at Rest = 86% (3L at rest to maintain >90%)  Patient Saturations on Room Air while Ambulating = not attempted  Patient Saturations on 4 Liters of oxygen while Ambulating = 86%, increased to 6L to maintain >90%  Please briefly explain why patient needs home oxygen: pt unable to maintain SpO2 >90% at rest, and requires increased O2 needs with exertion.   West Carbo, PT, DPT   Acute Rehabilitation Department Pager #: [phone]

## 2021-05-14 NOTE — Progress Notes (Signed)
Spoke with patient's husband regarding the patient's status.  He is adamant that he would like for her to come home rather than going to a SNF.  He reports that himself and the patient's sister will be there to help care for her.  Spoke with physical therapy to reevaluate for possible home PT/OT.  Will ambulate with pulse ox today.  I placed order for home O2.  We will follow-up this afternoon

## 2021-05-14 NOTE — Plan of Care (Signed)

## 2021-05-14 NOTE — TOC Progression Note (Signed)
Transition of Care [facility]) - Progression Note    Patient Details  Name: Melissa Case MRN: [id number] Date of Birth: January 19, 1961  Transition [facility]) CM/SW Contact  Bartholomew Crews, RN Phone Number: [phone] 05/14/2021, 11:34 AM  Clinical Narrative:     [facility] PT/OT referral accepted by [facility] for start of care next week.   Expected Discharge Plan: [place] Barriers to Discharge: No Barriers Identified  Expected Discharge Plan and Services Expected Discharge Plan: [place] In-house Referral: NA Discharge Planning Services: CM Consult Post Acute Care Choice: [place] arrangements for the past 2 months: Single Family Home                 DME Arranged: N/A DME Agency: NA       HH Arranged: PT, OT HH Agency: [place] Date [facility] Agency Contacted: 05/14/21 Time [place]: 8592 Representative spoke with at [place]: [place] ([place]) Interventions    Readmission Risk Interventions No flowsheet data found.

## 2021-05-14 NOTE — Progress Notes (Signed)
Received call from Pt's husband this evening around 1910.  He advised that [place] had not delivered the home oxygen to them yet. And they only had a total of 4 tanks at home, and were concern with the Holiday, if they would make it or not.  I phoned Tye with [facility], and she notified her supervisor of the situation to f/u with [employer]. After several call, the supervisor was able to contact [employer] and they were going to deliver the supply to the pt this evening around 2200.  I did phone the pt's spouse the evening and f/u with him to make sure they were set up, and they were.

## 2021-05-17 ENCOUNTER — Telehealth: Payer: Self-pay

## 2021-05-17 NOTE — Telephone Encounter (Signed)
Transition Care Management Follow-up Telephone Call  Call completed with patient's husband, Richard.  DPR on file to speak with him  Date of discharge and from where: 05/14/2021, [facility] How have you been since you were released from the hospital? He said she is doing better; but tires easily and not moving around much. He also said that her feet are starting to swell again.  The swelling had decreased in the hospital but is starting again. He explained that he purchased electronic compression devices for her legs like they had used for her in the hospital, and he puts them on her a couple of times a day. Any questions or concerns? Yes - noted above. Richard also said that he has the [employer] Cream for her wounds - right ischium and bilateral buttocks and sacrum - but he is not sure it is working well.  He will discuss with provider at the wound clinic.  He would like to know if Ms Minette Brine, NP will be ordering the eliquis going forward, or if she will need to see someone else.   Items Reviewed: Did the pt receive and understand the discharge instructions provided? Yes  Medications obtained and verified? Yes  - he said that she has all of her medications and he understands the orders for the change in eliquis starting 05/19/2021.  Other? No  Any new allergies since your discharge? No  Dietary orders reviewed? Yes Do you have support at home? Yes  her husband, [name], and her sister.   Home Care and Equipment/Supplies: Were home health services ordered? yes If so, what is the name of the agency? [facility]  Has the agency set up a time to come to the patient's home? No - instructed him to call the agency tomorrow if he has not heard from them. Informed him that the phone number for Alvis Lemmings is on page 2 of the AVS.  Were any new equipment or medical supplies ordered?  Yes: O2 What is the name of the medical supply agency? [facility] Were you able to get the supplies/equipment?  yes Do you have any questions related to the use of the equipment or supplies? No  Richard said she has been using the  O2 @ 4L /min continuously.   Functional Questionnaire: (I = Independent and D = Dependent) ADLs: needs assistance. Has RW to use with ambulation. Richard manages her medications and prepares meals   Follow up appointments reviewed:  PCP Hospital f/u appt confirmed? Yes  Scheduled to see Durene Fruits, NP on 05/20/2021. [facility] f/u appt confirmed? Yes  Scheduled to see wound care - 05/20/2021. Her husband said she will be able to make this appointment as well as the appointment with Durene Fruits, NP on the same day.  Are transportation arrangements needed? No  If their condition worsens, is the pt aware to call PCP or go to the Emergency Dept.? Yes Was the patient provided with contact information for the PCP's office or ED? Yes Was to pt encouraged to call back with questions or concerns? Yes

## 2021-05-18 NOTE — Discharge Summary (Signed)
[facility] Discharge Summary  Patient name: Melissa Case Medical record number: [id number] Date of birth: Apr 16, 1961 Age: 60 y.o. Gender: female Date of Admission: 05/10/2021  Date of Discharge: 05/14/2021 Admitting Physician: Martyn Malay, MD  Primary Care Provider: Camillia Herter, NP Consultants: None  Indication for Hospitalization: Shortness of breath  Discharge Diagnoses/Problem List:  Acute hypoxic respiratory failure 2/2 bilateral PEs without right heart strain Anemia, likely of chronic disease  Disposition: Home  Discharge Condition: Stable  Discharge Exam: Per Dr. Saul Fordyce evaluation on 12/24 Temp:  [97.4 F (36.3 C)-98 F (36.7 C)] 97.9 F (36.6 C) (12/23 2016) Pulse Rate:  [72-80] 80 (12/23 2016) Resp:  [16-20] 16 (12/23 2016) BP: (96-112)/(54-61) 96/54 (12/23 2016) SpO2:  [90 %-98 %] 96 % (12/23 2016) Physical Exam: General: No distress, alert and oriented, resting in bed but awakens for my exam Cardiovascular: Regular rate, normal sinus rhythm Respiratory: No increased work of breathing, maintaining normal SpO2 with 3L IXL in place. Fine bibasilar crackles Extremities: 2+ pitting edema BL lower extremities  Brief Hospital Course:  Melissa Case is a 60 y.o.female with a history of parotid cancer s/p radiation resection, GERD, panic attacks, seizures who was admitted to the family practice teaching Service at [facility] for PE and bilateral DVTs. Her hospital course is detailed below:  Acute hypoxic respiratory failure 2/2 PE   bilateral DVTs Patient presented to ED with shortness of breath and bilateral lower extremity edema x2 weeks.  CXR noted mild, diffuse bilateral interstitial pulmonary opacity, likely edema.  CTA noted significantly bilateral pulmonary emboli and additionally a rounded pleural-based density in the posterior right lower lobe which may represent an early pulmonary infarct.  Echo notably showed LVEF 55 to 60%  without regional wall motion abnormalities, mild concentric LVH, no evidence of right heart strain.  Patient received 1 dose of Eliquis in the ED.  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  Patient was transitioned to Eliquis 10 mg twice daily x7 days (start 12/22) and and will transition to then Eliquis 5 mg twice daily on 12/29.  Patient worked with physical therapy and Occupational Therapy who initially recommended SNF but on reevaluation home PT.  Patient did have oxygen requirement at discharge so was set up with home O2.  Anemia   Thrombocytopenia Patient with chronic anemia had acute decrease in hemoglobin that trended stable. Mild thrombocytopenia noted upon admission and trended stable. Upon discharge, Hgb 8.7, platelets 152.  Other chronic conditions were medically managed with home medications and formulary alternatives as necessary (GERD, panic attacks, seizure history)  PCP Follow-up Recommendations: Repeat CBC outpatient to monitor Hgb and platelets Ensure patient is working with home PT Patient should transition to Eliquis 5 mg twice daily on 12/29  Significant Procedures: None  Significant Labs and Imaging:  Recent Labs  Lab 05/12/21 1050 05/13/21 0109 05/14/21 0104  WBC 5.4 4.4 3.9*  HGB 9.9* 9.0* 8.7*  HCT 30.1* 26.6* 27.0*  PLT 135* 132* 152   No results for input(s): NA, K, CL, CO2, GLUCOSE, BUN, CREATININE, CALCIUM, MG, PHOS, ALKPHOS, AST, ALT, ALBUMIN, PROTEIN in the last 168 hours.  Invalid input(s): TBILI    Results/Tests Pending at Time of Discharge: None  Discharge Medications:  Allergies as of 05/14/2021       Reactions   Morphine Itching   Penicillins Rash   Has patient had a  PCN reaction causing immediate rash, facial/tongue/throat swelling, SOB or lightheadedness with hypotension: No Has patient had a PCN reaction  causing severe rash involving mucus membranes or skin necrosis: No Has patient had a PCN reaction that required hospitalization No Has patient had a PCN reaction occurring within the last 10 years: No If all of the above answers are "NO", then may proceed with Cephalosporin use.        Medication List     STOP taking these medications    mupirocin cream 2 % Commonly known as: Bactroban   ondansetron 4 MG disintegrating tablet Commonly known as: ZOFRAN-ODT       TAKE these medications    apixaban 5 MG Tabs tablet Commonly known as: ELIQUIS Take 2 tablets (10 mg total) by mouth 2 (two) times daily for 4 days.   apixaban 5 MG Tabs tablet Commonly known as: ELIQUIS Take 1 tablet (5 mg total) by mouth 2 (two) times daily. Start taking on: May 19, 2021   dexamethasone 4 MG tablet Commonly known as: DECADRON Take 1 tablet (4 mg total) by mouth every 12 (twelve) hours. What changed: when to take this   divalproex 500 MG DR tablet Commonly known as: DEPAKOTE Take 1 tablet (500 mg total) by mouth 2 (two) times daily.   divalproex 250 MG DR tablet Commonly known as: DEPAKOTE Take 1 tablet (250 mg total) by mouth 2 (two) times daily. (In addition to 500mg  twice daily for a total dosage of 750mg  BID).   esomeprazole 40 MG capsule Commonly known as: NEXIUM Take 1 capsule by mouth once daily   Gerhardt's butt cream Crea Apply 1 application topically 3 (three) times daily.   levETIRAcetam 500 MG tablet Commonly known as: KEPPRA Take 1 tablet (500 mg total) by mouth 2 (two) times daily.   nicotine 7 mg/24hr patch Commonly known as: NICODERM CQ - dosed in mg/24 hr Place 1 patch (7 mg total) onto the skin daily.   PARoxetine 40 MG tablet Commonly known as: PAXIL Take 1 tablet (40 mg total) by mouth daily.        Discharge Instructions: Please refer to Patient Instructions section of EMR for full details.  Patient was counseled important signs and symptoms that  should prompt return to medical care, changes in medications, dietary instructions, activity restrictions, and follow up appointments.   Follow-Up Appointments:  Follow-up Information     Care, [facility] Follow up.   Specialty: [place] Why: the office will call next week to schedule home health visits Contact information: [address] [place] [address] [phone]                 Gifford Shave, MD 05/18/2021, 3:13 PM PGY-3, [place]

## 2021-05-20 ENCOUNTER — Other Ambulatory Visit: Payer: Self-pay

## 2021-05-20 ENCOUNTER — Encounter (HOSPITAL_BASED_OUTPATIENT_CLINIC_OR_DEPARTMENT_OTHER): Payer: PRIVATE HEALTH INSURANCE | Admitting: Internal Medicine

## 2021-05-20 ENCOUNTER — Encounter: Payer: Self-pay | Admitting: Family

## 2021-05-20 ENCOUNTER — Ambulatory Visit (INDEPENDENT_AMBULATORY_CARE_PROVIDER_SITE_OTHER): Payer: PRIVATE HEALTH INSURANCE | Admitting: Family

## 2021-05-20 VITALS — BP 121/77 | HR 86 | Temp 98.0°F | Resp 18 | Ht 64.02 in | Wt 175.0 lb

## 2021-05-20 DIAGNOSIS — C07 Malignant neoplasm of parotid gland: Secondary | ICD-10-CM

## 2021-05-20 DIAGNOSIS — G9389 Other specified disorders of brain: Secondary | ICD-10-CM

## 2021-05-20 DIAGNOSIS — L89152 Pressure ulcer of sacral region, stage 2: Secondary | ICD-10-CM | POA: Diagnosis not present

## 2021-05-20 DIAGNOSIS — Z923 Personal history of irradiation: Secondary | ICD-10-CM | POA: Diagnosis not present

## 2021-05-20 DIAGNOSIS — L89212 Pressure ulcer of right hip, stage 2: Secondary | ICD-10-CM | POA: Diagnosis present

## 2021-05-20 DIAGNOSIS — I2699 Other pulmonary embolism without acute cor pulmonale: Secondary | ICD-10-CM

## 2021-05-20 DIAGNOSIS — I824Y9 Acute embolism and thrombosis of unspecified deep veins of unspecified proximal lower extremity: Secondary | ICD-10-CM | POA: Diagnosis not present

## 2021-05-20 DIAGNOSIS — Z09 Encounter for follow-up examination after completed treatment for conditions other than malignant neoplasm: Secondary | ICD-10-CM

## 2021-05-20 DIAGNOSIS — R569 Unspecified convulsions: Secondary | ICD-10-CM | POA: Diagnosis not present

## 2021-05-20 MED ORDER — FUROSEMIDE 20 MG PO TABS: 20.0000 mg | ORAL_TABLET | Freq: Every day | ORAL | 0 refills | Status: DC

## 2021-05-20 MED ORDER — APIXABAN 5 MG PO TABS: 5.0000 mg | ORAL_TABLET | Freq: Two times a day (BID) | ORAL | 0 refills | Status: DC

## 2021-05-20 NOTE — Progress Notes (Signed)
Pt presents for hospitalization follow-up, pt states that still has feet edema

## 2021-05-20 NOTE — Progress Notes (Signed)
0 Complex Wound Cleansing - multiple wounds X- 1 5 Wound Imaging (photographs - any number of wounds) []  - 0 Wound Tracing (instead of photographs) X- 1 5 Simple Wound Measurement - one wound []  - 0 Complex Wound Measurement - multiple wounds INTERVENTIONS - Wound Dressings []  - 0 Small Wound Dressing one or multiple wounds X- 1 15 Medium Wound Dressing one or multiple wounds []  - 0 Large Wound Dressing one or multiple wounds []  - 0 Application of Medications - topical []  - 0 Application of Medications - injection INTERVENTIONS - Miscellaneous []  - 0 External ear exam []  - 0 Specimen Collection (cultures, biopsies, blood, body fluids, etc.) []  - 0 Specimen(s) / Culture(s) sent or taken to Lab for analysis []  - 0 Patient Transfer (multiple staff / Civil Service fast streamer / Similar devices) []  - 0 Simple Staple / Suture removal (25 or less) []  - 0 Complex Staple / Suture removal (26 or more) []  - 0 Hypo / Hyperglycemic Management (close monitor of Blood Glucose) []  - 0 Ankle / Brachial Index (ABI) - do not check if billed separately X- 1 5 Vital Signs Has the patient been seen at the hospital within the last three years: Yes Total Score: 80 Level Of Care: New/Established - Level 3 Electronic Signature(s) Signed: 05/20/2021 12:30:09 PM By: Lorrin Jackson Entered By: Lorrin Jackson on 05/20/2021 08:36:44 -------------------------------------------------------------------------------- Encounter Discharge Information Details Patient Name: Date of Service: Melissa Case NICA 05/20/2021 8:00 A M Medical Record Number: [id number] Patient Account Number: [id number] Date of Birth/Sex: Treating RN: 1960-11-11 (60 y.o. [name] Primary Care Shawntell Dixson: Durene Fruits Other Clinician: Referring Peyten Punches: Treating  Jontavius Rabalais/Extender: Wilber Oliphant, Amy Weeks in Treatment: 2 Encounter Discharge Information Items Discharge Condition: Stable Ambulatory Status: Wheelchair Discharge Destination: Home Transportation: Private Auto Accompanied By: Husband Schedule Follow-up Appointment: Yes Clinical Summary of Care: Provided on 05/20/2021 Form Type Recipient Paper Patient Patient Electronic Signature(s) Signed: 05/20/2021 12:30:09 PM By: Lorrin Jackson Entered By: Lorrin Jackson on 05/20/2021 08:45:46 -------------------------------------------------------------------------------- Lower Extremity Assessment Details Patient Name: Date of Service: Melissa Case 05/20/2021 8:00 [place] Record Number: [id number] Patient Account Number: [id number] Date of Birth/Sex: Treating RN: 1961-02-07 (60 y.o. [name] Primary Care Ismelda Weatherman: Durene Fruits Other Clinician: Referring Dareld Mcauliffe: Treating Fatisha Rabalais/Extender: Wilber Oliphant, Amy Weeks in Treatment: 2 Electronic Signature(s) Signed: 05/20/2021 12:30:09 PM By: Lorrin Jackson Entered By: Lorrin Jackson on 05/20/2021 08:06:10 -------------------------------------------------------------------------------- Multi Wound Chart Details Patient Name: Date of Service: Melissa Case 05/20/2021 8:00 A M Medical Record Number: [id number] Patient Account Number: [id number] Date of Birth/Sex: Treating RN: 1961/02/18 (60 y.o. Elam Dutch Primary Care Ayako Tapanes: Minette Brine, [place] Other Clinician: Referring Kyle Luppino: Treating Gabrielly Mccrystal/Extender: Wilber Oliphant, Amy Weeks in Treatment: 2 Vital Signs Height(in): 64 Pulse(bpm): 96 Weight(lbs): 167 Blood Pressure(mmHg): 147/88 Body Mass Index(BMI): 29 Temperature(F): 97.7 Respiratory Rate(breaths/min): 20 Photos: [N/A:N/A] Sacrum Right Ischium N/A Wound Location: Pressure Injury Pressure Injury N/A Wounding Event: Pressure Ulcer Pressure Ulcer  N/A Primary Etiology: Seizure Disorder Seizure Disorder N/A Comorbid History: 04/21/2021 04/21/2021 N/A Date Acquired: 2 2 N/A Weeks of Treatment: Open Healed - Epithelialized N/A Wound Status: Yes No N/A Clustered Wound: 10 N/A N/A Clustered Quantity: 15.2x17.7x0.1 0x0x0 N/A Measurements L x W x D (cm) 211.304 0 N/A A (cm) : rea 21.13 0 N/A Volume (cm) : -156.20% 100.00% N/A % Reduction in A rea: -156.20% 100.00% N/A % Reduction in Volume: Category/Stage II Category/Stage II N/A Classification: Medium N/A N/A Exudate  y.o. [name] Primary Care Cerenity Goshorn: Durene Fruits Other Clinician: Referring Tenzin Edelman: Treating Xaviera Flaten/Extender: Wilber Oliphant, Amy Weeks in Treatment: 2 Wound Status Wound Number: 1 Primary Etiology: Pressure Ulcer Wound Location: Sacrum Wound Status: Open Wounding Event: Pressure Injury Comorbid History: Seizure Disorder Date Acquired: 04/21/2021 Weeks Of Treatment: 2 Clustered Wound:  Yes Photos Wound Measurements Length: (cm) 15.2 Width: (cm) 17.7 Depth: (cm) 0.1 Clustered Quantity: 10 Area: (cm) 211.304 Volume: (cm) 21.13 % Reduction in Area: -156.2% % Reduction in Volume: -156.2% Epithelialization: None Tunneling: No Undermining: No Wound Description Classification: Category/Stage II Wound Margin: Distinct, outline attached Exudate Amount: Medium Exudate Type: Serosanguineous Exudate Color: red, brown Foul Odor After Cleansing: No Slough/Fibrino No Wound Bed Granulation Amount: Large (67-100%) Exposed Structure Granulation Quality: Red, Pink Fascia Exposed: No Necrotic Amount: None Present (0%) Fat Layer (Subcutaneous Tissue) Exposed: Yes Tendon Exposed: No Muscle Exposed: No Joint Exposed: No Bone Exposed: No Assessment Notes Cluster of several small open areas over sacral area. Treatment Notes Wound #1 (Sacrum) Cleanser Soap and Water Discharge Instruction: May shower and wash wound with dial antibacterial soap and water prior to dressing change. Peri-Wound Care Zinc Oxide Ointment 30g tube Discharge Instruction: or Laverta Baltimore Cream Topical Primary Dressing Secondary Dressing ABD Pad, 5x9 Discharge Instruction: Apply over primary dressing as directed. Secured With Compression Wrap Compression Stockings Environmental education officer) Signed: 05/20/2021 12:30:09 PM By: Lorrin Jackson Entered By: Lorrin Jackson on 05/20/2021 08:11:05 -------------------------------------------------------------------------------- Wound Assessment Details Patient Name: Date of Service: Melissa Case 05/20/2021 8:00 A M Medical Record Number: [id number] Patient Account Number: [id number] Date of Birth/Sex: Treating RN: 1960/12/30 (60 y.o. [name] Primary Care Torey Regan: Durene Fruits Other Clinician: Referring Daneen Volcy: Treating Sabella Traore/Extender: Wilber Oliphant, Amy Weeks in Treatment: 2 Wound Status Wound Number:  2 Primary Etiology: Pressure Ulcer Wound Location: Right Ischium Wound Status: Healed - Epithelialized Wounding Event: Pressure Injury Comorbid History: Seizure Disorder Date Acquired: 04/21/2021 Weeks Of Treatment: 2 Clustered Wound: No Photos Wound Measurements Length: (cm) Width: (cm) Depth: (cm) Area: (cm) Volume: (cm) 0 % Reduction in Area: 100% 0 % Reduction in Volume: 100% 0 0 0 Wound Description Classification: Category/Stage II Electronic Signature(s) Signed: 05/20/2021 12:30:09 PM By: Lorrin Jackson Entered By: Lorrin Jackson on 05/20/2021 08:10:13 -------------------------------------------------------------------------------- Vitals Details Patient Name: Date of Service: Melissa Case NICA 05/20/2021 8:00 A M Medical Record Number: [id number] Patient Account Number: [id number] Date of Birth/Sex: Treating RN: Nov 21, 1960 (60 y.o. [name] Primary Care Deidre Carino: Durene Fruits Other Clinician: Referring Jorja Empie: Treating Daiveon Markman/Extender: Wilber Oliphant, Amy Weeks in Treatment: 2 Vital Signs Time Taken: 08:05 Temperature (F): 97.7 Height (in): 64 Pulse (bpm): 96 Weight (lbs): 167 Respiratory Rate (breaths/min): 20 Body Mass Index (BMI): 28.7 Blood Pressure (mmHg): 147/88 Reference Range: 80 - 120 mg / dl Electronic Signature(s) Signed: 05/20/2021 12:30:09 PM By: Lorrin Jackson Entered By: Lorrin Jackson on 05/20/2021 08:05:60  y.o. [name] Primary Care Cerenity Goshorn: Durene Fruits Other Clinician: Referring Tenzin Edelman: Treating Xaviera Flaten/Extender: Wilber Oliphant, Amy Weeks in Treatment: 2 Wound Status Wound Number: 1 Primary Etiology: Pressure Ulcer Wound Location: Sacrum Wound Status: Open Wounding Event: Pressure Injury Comorbid History: Seizure Disorder Date Acquired: 04/21/2021 Weeks Of Treatment: 2 Clustered Wound:  Yes Photos Wound Measurements Length: (cm) 15.2 Width: (cm) 17.7 Depth: (cm) 0.1 Clustered Quantity: 10 Area: (cm) 211.304 Volume: (cm) 21.13 % Reduction in Area: -156.2% % Reduction in Volume: -156.2% Epithelialization: None Tunneling: No Undermining: No Wound Description Classification: Category/Stage II Wound Margin: Distinct, outline attached Exudate Amount: Medium Exudate Type: Serosanguineous Exudate Color: red, brown Foul Odor After Cleansing: No Slough/Fibrino No Wound Bed Granulation Amount: Large (67-100%) Exposed Structure Granulation Quality: Red, Pink Fascia Exposed: No Necrotic Amount: None Present (0%) Fat Layer (Subcutaneous Tissue) Exposed: Yes Tendon Exposed: No Muscle Exposed: No Joint Exposed: No Bone Exposed: No Assessment Notes Cluster of several small open areas over sacral area. Treatment Notes Wound #1 (Sacrum) Cleanser Soap and Water Discharge Instruction: May shower and wash wound with dial antibacterial soap and water prior to dressing change. Peri-Wound Care Zinc Oxide Ointment 30g tube Discharge Instruction: or Laverta Baltimore Cream Topical Primary Dressing Secondary Dressing ABD Pad, 5x9 Discharge Instruction: Apply over primary dressing as directed. Secured With Compression Wrap Compression Stockings Environmental education officer) Signed: 05/20/2021 12:30:09 PM By: Lorrin Jackson Entered By: Lorrin Jackson on 05/20/2021 08:11:05 -------------------------------------------------------------------------------- Wound Assessment Details Patient Name: Date of Service: Melissa Case 05/20/2021 8:00 A M Medical Record Number: [id number] Patient Account Number: [id number] Date of Birth/Sex: Treating RN: 1960/12/30 (60 y.o. [name] Primary Care Torey Regan: Durene Fruits Other Clinician: Referring Daneen Volcy: Treating Sabella Traore/Extender: Wilber Oliphant, Amy Weeks in Treatment: 2 Wound Status Wound Number:  2 Primary Etiology: Pressure Ulcer Wound Location: Right Ischium Wound Status: Healed - Epithelialized Wounding Event: Pressure Injury Comorbid History: Seizure Disorder Date Acquired: 04/21/2021 Weeks Of Treatment: 2 Clustered Wound: No Photos Wound Measurements Length: (cm) Width: (cm) Depth: (cm) Area: (cm) Volume: (cm) 0 % Reduction in Area: 100% 0 % Reduction in Volume: 100% 0 0 0 Wound Description Classification: Category/Stage II Electronic Signature(s) Signed: 05/20/2021 12:30:09 PM By: Lorrin Jackson Entered By: Lorrin Jackson on 05/20/2021 08:10:13 -------------------------------------------------------------------------------- Vitals Details Patient Name: Date of Service: Melissa Case NICA 05/20/2021 8:00 A M Medical Record Number: [id number] Patient Account Number: [id number] Date of Birth/Sex: Treating RN: Nov 21, 1960 (60 y.o. [name] Primary Care Deidre Carino: Durene Fruits Other Clinician: Referring Jorja Empie: Treating Daiveon Markman/Extender: Wilber Oliphant, Amy Weeks in Treatment: 2 Vital Signs Time Taken: 08:05 Temperature (F): 97.7 Height (in): 64 Pulse (bpm): 96 Weight (lbs): 167 Respiratory Rate (breaths/min): 20 Body Mass Index (BMI): 28.7 Blood Pressure (mmHg): 147/88 Reference Range: 80 - 120 mg / dl Electronic Signature(s) Signed: 05/20/2021 12:30:09 PM By: Lorrin Jackson Entered By: Lorrin Jackson on 05/20/2021 08:05:60  y.o. [name] Primary Care Cerenity Goshorn: Durene Fruits Other Clinician: Referring Tenzin Edelman: Treating Xaviera Flaten/Extender: Wilber Oliphant, Amy Weeks in Treatment: 2 Wound Status Wound Number: 1 Primary Etiology: Pressure Ulcer Wound Location: Sacrum Wound Status: Open Wounding Event: Pressure Injury Comorbid History: Seizure Disorder Date Acquired: 04/21/2021 Weeks Of Treatment: 2 Clustered Wound:  Yes Photos Wound Measurements Length: (cm) 15.2 Width: (cm) 17.7 Depth: (cm) 0.1 Clustered Quantity: 10 Area: (cm) 211.304 Volume: (cm) 21.13 % Reduction in Area: -156.2% % Reduction in Volume: -156.2% Epithelialization: None Tunneling: No Undermining: No Wound Description Classification: Category/Stage II Wound Margin: Distinct, outline attached Exudate Amount: Medium Exudate Type: Serosanguineous Exudate Color: red, brown Foul Odor After Cleansing: No Slough/Fibrino No Wound Bed Granulation Amount: Large (67-100%) Exposed Structure Granulation Quality: Red, Pink Fascia Exposed: No Necrotic Amount: None Present (0%) Fat Layer (Subcutaneous Tissue) Exposed: Yes Tendon Exposed: No Muscle Exposed: No Joint Exposed: No Bone Exposed: No Assessment Notes Cluster of several small open areas over sacral area. Treatment Notes Wound #1 (Sacrum) Cleanser Soap and Water Discharge Instruction: May shower and wash wound with dial antibacterial soap and water prior to dressing change. Peri-Wound Care Zinc Oxide Ointment 30g tube Discharge Instruction: or Laverta Baltimore Cream Topical Primary Dressing Secondary Dressing ABD Pad, 5x9 Discharge Instruction: Apply over primary dressing as directed. Secured With Compression Wrap Compression Stockings Environmental education officer) Signed: 05/20/2021 12:30:09 PM By: Lorrin Jackson Entered By: Lorrin Jackson on 05/20/2021 08:11:05 -------------------------------------------------------------------------------- Wound Assessment Details Patient Name: Date of Service: Melissa Case 05/20/2021 8:00 A M Medical Record Number: [id number] Patient Account Number: [id number] Date of Birth/Sex: Treating RN: 1960/12/30 (60 y.o. [name] Primary Care Torey Regan: Durene Fruits Other Clinician: Referring Daneen Volcy: Treating Sabella Traore/Extender: Wilber Oliphant, Amy Weeks in Treatment: 2 Wound Status Wound Number:  2 Primary Etiology: Pressure Ulcer Wound Location: Right Ischium Wound Status: Healed - Epithelialized Wounding Event: Pressure Injury Comorbid History: Seizure Disorder Date Acquired: 04/21/2021 Weeks Of Treatment: 2 Clustered Wound: No Photos Wound Measurements Length: (cm) Width: (cm) Depth: (cm) Area: (cm) Volume: (cm) 0 % Reduction in Area: 100% 0 % Reduction in Volume: 100% 0 0 0 Wound Description Classification: Category/Stage II Electronic Signature(s) Signed: 05/20/2021 12:30:09 PM By: Lorrin Jackson Entered By: Lorrin Jackson on 05/20/2021 08:10:13 -------------------------------------------------------------------------------- Vitals Details Patient Name: Date of Service: Melissa Case NICA 05/20/2021 8:00 A M Medical Record Number: [id number] Patient Account Number: [id number] Date of Birth/Sex: Treating RN: Nov 21, 1960 (60 y.o. [name] Primary Care Deidre Carino: Durene Fruits Other Clinician: Referring Jorja Empie: Treating Daiveon Markman/Extender: Wilber Oliphant, Amy Weeks in Treatment: 2 Vital Signs Time Taken: 08:05 Temperature (F): 97.7 Height (in): 64 Pulse (bpm): 96 Weight (lbs): 167 Respiratory Rate (breaths/min): 20 Body Mass Index (BMI): 28.7 Blood Pressure (mmHg): 147/88 Reference Range: 80 - 120 mg / dl Electronic Signature(s) Signed: 05/20/2021 12:30:09 PM By: Lorrin Jackson Entered By: Lorrin Jackson on 05/20/2021 08:05:54

## 2021-05-20 NOTE — Progress Notes (Signed)
KYLI SORTER ([id number]) , Visit Report for 05/20/2021 Chief Complaint Document Details Patient Name: Date of Service: Melissa Case 05/20/2021 8:00 A M Medical Record Number: [id number] Patient Account Number: [id number] Date of Birth/Sex: Treating RN: 05-16-1961 (60 y.o. Melissa Case Primary Care Provider: Durene Fruits Other Clinician: Referring Provider: Treating Provider/Extender: Wilber Oliphant, Amy Weeks in Treatment: 2 Information Obtained from: Patient Chief Complaint Multiple areas of skin breakdown to the sacrum and right ischium Electronic Signature(s) Signed: 05/20/2021 9:19:35 AM By: Kalman Shan DO Entered By: Kalman Shan on 05/20/2021 09:13:46 -------------------------------------------------------------------------------- HPI Details Patient Name: Date of Service: Melissa Case 05/20/2021 8:00 A M Medical Record Number: [id number] Patient Account Number: [id number] Date of Birth/Sex: Treating RN: 17-Jan-1961 (60 y.o. Melissa Case Primary Care Provider: Durene Fruits Other Clinician: Referring Provider: Treating Provider/Extender: Wilber Oliphant, Amy Weeks in Treatment: 2 History of Present Illness HPI Description: Admission 05/06/2021 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 She states that she has had debilitating seizures that have caused her to be less mobile. She uses a walker for ambulation. She states she mostly sits all day. She has been using mupirocin cream that was prescribed by her primary care provider. She reports mild tenderness to the areas. She denies signs of infection. 12/30; patient presents for follow-up. She was recently hospitalized from 12/20 just 12/24 for PEs and lower  extremity DVT She is currently on Eliquis. She s. has been using zinc oxide to the wound beds. She reports improvement in wound healing. She currently denies signs of infection. Electronic Signature(s) Signed: 05/20/2021 9:19:35 AM By: Kalman Shan DO Entered By: Kalman Shan on 05/20/2021 09:14:51 -------------------------------------------------------------------------------- Physical Exam Details Patient Name: Date of Service: Melissa Case 05/20/2021 8:00 A M Medical Record Number: [id number] Patient Account Number: [id number] Date of Birth/Sex: Treating RN: 1960/09/17 (60 y.o. Melissa Case Primary Care Provider: Other Clinician: Durene Fruits Referring Provider: Treating Provider/Extender: Wilber Oliphant, Amy Weeks in Treatment: 2 Constitutional respirations regular, non-labored and within target range for patient.Marland Kitchen Psychiatric pleasant and cooperative. Notes T the sacrum/bilateral buttocks there are multiple areas of skin breakdown. The right ischium wound has epithelialized. No signs of infection. o Electronic Signature(s) Signed: 05/20/2021 9:19:35 AM By: Kalman Shan DO Entered By: Kalman Shan on 05/20/2021 09:16:02 -------------------------------------------------------------------------------- Physician Orders Details Patient Name: Date of Service: Melissa Case 05/20/2021 8:00 A M Medical Record Number: [id number] Patient Account Number: [id number] Date of Birth/Sex: Treating RN: 1961/04/20 (60 y.o. Melissa Case Primary Care Provider: Durene Fruits Other Clinician: Referring Provider: Treating Provider/Extender: Wilber Oliphant, Amy Weeks in Treatment: 2 Verbal / Phone Orders: No Diagnosis Coding ICD-10 Coding Code Description L89.152 Pressure ulcer of sacral region, stage 2 L89.212 Pressure ulcer of right hip, stage 2 R56.9 Unspecified convulsions C07 Malignant neoplasm of parotid gland Follow-up  Appointments Return appointment in 3 weeks. - with Dr. Heber Fosston Bathing/ Shower/ Hygiene May shower with protection but do not get wound dressing(s) wet. Off-Loading Turn and reposition every 2 hours Wound Treatment Wound #1 - Sacrum Cleanser: Soap and Water 1 x Per Day/15 Days Discharge Instructions: May shower and wash wound with dial antibacterial soap and water prior to dressing change. Peri-Wound Care: Zinc Oxide Ointment 30g tube 1 x  Per Day/15 Days Discharge Instructions: or Laverta Baltimore Cream Secondary Dressing: ABD Pad, 5x9 (Generic) 1 x Per Day/15 Days Discharge Instructions: Apply over primary dressing as directed. Electronic Signature(s) Signed: 05/20/2021 9:19:35 AM By: Kalman Shan DO Entered By: Kalman Shan on 05/20/2021 09:16:21 -------------------------------------------------------------------------------- Problem List Details Patient Name: Date of Service: Melissa Case 05/20/2021 8:00 A M Medical Record Number: [id number] Patient Account Number: [id number] Date of Birth/Sex: Treating RN: Oct 16, 1960 (60 y.o. Melissa Case Primary Care Provider: Durene Fruits Other Clinician: Referring Provider: Treating Provider/Extender: Wilber Oliphant, Amy Weeks in Treatment: 2 Active Problems ICD-10 Encounter Code Description Active Date MDM Diagnosis L89.152 Pressure ulcer of sacral region, stage 2 05/06/2021 No Yes L89.212 Pressure ulcer of right hip, stage 2 05/06/2021 No Yes R56.9 Unspecified convulsions 05/06/2021 No Yes C07 Malignant neoplasm of parotid gland 05/06/2021 No Yes Inactive Problems Resolved Problems Electronic Signature(s) Signed: 05/20/2021 9:19:35 AM By: Kalman Shan DO Entered By: Kalman Shan on 05/20/2021 09:13:18 -------------------------------------------------------------------------------- Progress Note Details Patient Name: Date of Service: Melissa Case 05/20/2021 8:00 A M Medical Record  Number: [id number] Patient Account Number: [id number] Date of Birth/Sex: Treating RN: February 06, 1961 (60 y.o. Melissa Case Primary Care Provider: Durene Fruits Other Clinician: Referring Provider: Treating Provider/Extender: Wilber Oliphant, Amy Weeks in Treatment: 2 Subjective Chief Complaint Information obtained from Patient Multiple areas of skin breakdown to the sacrum and right ischium History of Present Illness (HPI) Admission 05/06/2021 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 She states that she has had debilitating seizures that have caused her to be less mobile. She uses a walker for ambulation. She states she mostly sits all day. She has been using mupirocin cream that was prescribed by her primary care provider. She reports mild tenderness to the areas. She denies signs of infection. 12/30; patient presents for follow-up. She was recently hospitalized from 12/20 just 12/24 for PEs and lower extremity DVT She is currently on Eliquis. She s. has been using zinc oxide to the wound beds. She reports improvement in wound healing. She currently denies signs of infection. Patient History Information obtained from Patient, Chart. Family History Unknown History. Social History Current every day smoker - VAPORIZER, Marital Status - Married, Alcohol Use - Never, Drug Use - No History, Caffeine Use - Never. Medical History Eyes Denies history of Cataracts, Glaucoma, Optic Neuritis Ear/Nose/Mouth/Throat Denies history of Chronic sinus problems/congestion, Middle ear problems Hematologic/Lymphatic Denies history of Anemia, Hemophilia, Human Immunodeficiency Virus, Lymphedema, Sickle Cell Disease Respiratory Denies history of Aspiration, Asthma, Chronic  Obstructive Pulmonary Disease (COPD), Pneumothorax, Sleep Apnea, Tuberculosis Cardiovascular Denies history of Angina, Arrhythmia, Congestive Heart Failure, Coronary Artery Disease, Deep Vein Thrombosis, Hypertension, Hypotension, Myocardial Infarction, Peripheral Arterial Disease, Peripheral Venous Disease, Phlebitis, Vasculitis Gastrointestinal Denies history of Cirrhosis , Colitis, Crohnoos, Hepatitis A, Hepatitis B, Hepatitis C Genitourinary Denies history of End Stage Renal Disease Immunological Denies history of Lupus Erythematosus, Raynaudoos, Scleroderma Integumentary (Skin) Denies history of History of Burn Musculoskeletal Denies history of Gout, Rheumatoid Arthritis, Osteoarthritis, Osteomyelitis Neurologic Patient has history of Seizure Disorder Denies history of Dementia, Neuropathy, Quadriplegia, Paraplegia Oncologic Denies history of Received Chemotherapy, Received Radiation Psychiatric Denies history of Anorexia/bulimia, Confinement Anxiety Hospitalization/Surgery History - ABDOMINAL HYSTERECTOMY. - CHOLECYSTECTOMY. - MULTIPLE ETRACTIONS WITH ALVEOPLASTY. - PARATIDECTOMY. Medical A  Surgical History Notes nd Gastrointestinal GERD Objective Constitutional respirations regular, non-labored and within target range for patient.. Vitals Time Taken: 8:05 AM, Height: 64 in, Weight: 167 lbs, BMI: 28.7, Temperature: 97.7 F, Pulse: 96 bpm, Respiratory Rate: 20 breaths/min, Blood Pressure: 147/88 mmHg. Psychiatric pleasant and cooperative. General Notes: T the sacrum/bilateral buttocks there are multiple areas of skin breakdown. The right ischium wound has epithelialized. No signs of infection. o Integumentary (Hair, Skin) Wound #1 status is Open. Original cause of wound was Pressure Injury. The date acquired was: 04/21/2021. The wound has been in treatment 2 weeks. The wound is located on the Sacrum. The wound measures 15.2cm length x 17.7cm width x 0.1cm depth;  211.304cm^2 area and 21.13cm^3 volume. There is Fat Layer (Subcutaneous Tissue) exposed. There is no tunneling or undermining noted. There is a medium amount of serosanguineous drainage noted. The wound margin is distinct with the outline attached to the wound base. There is large (67-100%) red, pink granulation within the wound bed. There is no necrotic tissue within the wound bed. General Notes: Cluster of several small open areas over sacral area. Wound #2 status is Healed - Epithelialized. Original cause of wound was Pressure Injury. The date acquired was: 04/21/2021. The wound has been in treatment 2 weeks. The wound is located on the Right Ischium. The wound measures 0cm length x 0cm width x 0cm depth; 0cm^2 area and 0cm^3 volume. Assessment Active Problems ICD-10 Pressure ulcer of sacral region, stage 2 Pressure ulcer of right hip, stage 2 Unspecified convulsions Malignant neoplasm of parotid gland Patient's wounds have shown improvement since last clinic visit. No signs of infection on exam. Unfortunately she was in the hospital for 4 days for PEs and DVT I was hopeful that her wounds would be healed by now but this likely set her back. I recommended continuing zinc oxide and aggressive offloading. s. Follow-up in 3 weeks. Plan Follow-up Appointments: Return appointment in 3 weeks. - with Dr. Heber Archbald Bathing/ Shower/ Hygiene: May shower with protection but do not get wound dressing(s) wet. Off-Loading: Turn and reposition every 2 hours WOUND #1: - Sacrum Wound Laterality: Cleanser: Soap and Water 1 x Per Day/15 Days Discharge Instructions: May shower and wash wound with dial antibacterial soap and water prior to dressing change. Peri-Wound Care: Zinc Oxide Ointment 30g tube 1 x Per Day/15 Days Discharge Instructions: or Laverta Baltimore Cream Secondary Dressing: ABD Pad, 5x9 (Generic) 1 x Per Day/15 Days Discharge Instructions: Apply over primary dressing as directed. 1. Zinc  oxide 2. Aggressive offloading 3. Follow-up in 3 weeks Electronic Signature(s) Signed: 05/20/2021 9:19:35 AM By: Kalman Shan DO Entered By: Kalman Shan on 05/20/2021 09:18:35 -------------------------------------------------------------------------------- HxROS Details Patient Name: Date of Service: Melissa Case 05/20/2021 8:00 A M Medical Record Number: [id number] Patient Account Number: [id number] Date of Birth/Sex: Treating RN: 16-Jul-1960 (60 y.o. Melissa Case Primary Care Provider: Durene Fruits Other Clinician: Referring Provider: Treating Provider/Extender: Wilber Oliphant, Amy Weeks in Treatment: 2 Information Obtained From Patient Chart Eyes Medical History: Negative for: Cataracts; Glaucoma; Optic Neuritis Ear/Nose/Mouth/Throat Medical History: Negative for: Chronic sinus problems/congestion; Middle ear problems Hematologic/Lymphatic Medical History: Negative for: Anemia; Hemophilia; Human Immunodeficiency Virus; Lymphedema; Sickle Cell Disease Respiratory Medical History: Negative for: Aspiration; Asthma; Chronic Obstructive Pulmonary Disease (COPD); Pneumothorax; Sleep Apnea; Tuberculosis Cardiovascular Medical History: Negative for: Angina; Arrhythmia; Congestive Heart Failure; Coronary Artery Disease; Deep Vein Thrombosis; Hypertension; Hypotension; Myocardial Infarction; Peripheral Arterial Disease; Peripheral Venous Disease; Phlebitis; Vasculitis Gastrointestinal Medical History: Negative for: Cirrhosis ;  Colitis; Crohns; Hepatitis A; Hepatitis B; Hepatitis C Past Medical History Notes: GERD Genitourinary Medical History: Negative for: End Stage Renal Disease Immunological Medical History: Negative for: Lupus Erythematosus; Raynauds; Scleroderma Integumentary (Skin) Medical History: Negative for: History of Burn Musculoskeletal Medical History: Negative for: Gout; Rheumatoid Arthritis; Osteoarthritis;  Osteomyelitis Neurologic Medical History: Positive for: Seizure Disorder Negative for: Dementia; Neuropathy; Quadriplegia; Paraplegia Oncologic Medical History: Negative for: Received Chemotherapy; Received Radiation Psychiatric Medical History: Negative for: Anorexia/bulimia; Confinement Anxiety Immunizations Implantable Devices None Hospitalization / Surgery History Type of Hospitalization/Surgery ABDOMINAL HYSTERECTOMY CHOLECYSTECTOMY MULTIPLE ETRACTIONS WITH ALVEOPLASTY PARATIDECTOMY Family and Social History Unknown History: Yes; Current every day smoker - VAPORIZER; Marital Status - Married; Alcohol Use: Never; Drug Use: No History; Caffeine Use: Never; Financial Concerns: No; Food, Clothing or Shelter Needs: No; Support System Lacking: No; Transportation Concerns: No Electronic Signature(s) Signed: 05/20/2021 9:19:35 AM By: Kalman Shan DO Signed: 05/20/2021 1:50:30 PM By: Baruch Gouty RN, BSN Entered By: Kalman Shan on 05/20/2021 09:15:03 -------------------------------------------------------------------------------- SuperBill Details Patient Name: Date of Service: Melissa Case 05/20/2021 Medical Record Number: [id number] Patient Account Number: [id number] Date of Birth/Sex: Treating RN: April 14, 1961 (60 y.o. Melissa Case Primary Care Provider: Durene Fruits Other Clinician: Referring Provider: Treating Provider/Extender: Wilber Oliphant, Amy Weeks in Treatment: 2 Diagnosis Coding ICD-10 Codes Code Description [phone] Pressure ulcer of sacral region, stage 2 L89.212 Pressure ulcer of right hip, stage 2 R56.9 Unspecified convulsions C07 Malignant neoplasm of parotid gland Facility Procedures CPT4 Code: [id number] Description: 99213 - WOUND CARE VISIT-LEV 3 EST PT Modifier: Quantity: 1 Physician Procedures : CPT4 Code Description Modifier [id number] 10211 - WC PHYS LEVEL 3 - EST PT ICD-10 Diagnosis Description L89.152 Pressure  ulcer of sacral region, stage 2 L89.212 Pressure ulcer of right hip, stage 2 R56.9 Unspecified convulsions C07 Malignant neoplasm of  parotid gland Quantity: 1 Electronic Signature(s) Signed: 05/20/2021 9:19:35 AM By: Kalman Shan DO Entered By: Kalman Shan on 05/20/2021 09:19:15

## 2021-05-21 LAB — CBC
Hematocrit: 33.2 % — ABNORMAL LOW (ref 34.0–46.6)
Hemoglobin: 10.8 g/dL — ABNORMAL LOW (ref 11.1–15.9)
MCH: 32.3 pg (ref 26.6–33.0)
MCHC: 32.5 g/dL (ref 31.5–35.7)
MCV: 99 fL — ABNORMAL HIGH (ref 79–97)
Platelets: 203 10*3/uL (ref 150–450)
RBC: 3.34 x10E6/uL — ABNORMAL LOW (ref 3.77–5.28)
RDW: 14.6 % (ref 11.7–15.4)
WBC: 4.8 10*3/uL (ref 3.4–10.8)

## 2021-05-21 NOTE — Progress Notes (Signed)
Blood count improved since 7 days ago. Continue with plan discussed in office 05/20/2021.

## 2021-05-24 ENCOUNTER — Telehealth: Payer: Self-pay | Admitting: Neurology

## 2021-05-24 DIAGNOSIS — R7303 Prediabetes: Secondary | ICD-10-CM | POA: Diagnosis not present

## 2021-05-24 DIAGNOSIS — K645 Perianal venous thrombosis: Secondary | ICD-10-CM | POA: Diagnosis not present

## 2021-05-24 DIAGNOSIS — K2270 Barrett's esophagus without dysplasia: Secondary | ICD-10-CM | POA: Diagnosis not present

## 2021-05-24 DIAGNOSIS — I2699 Other pulmonary embolism without acute cor pulmonale: Secondary | ICD-10-CM | POA: Diagnosis not present

## 2021-05-24 DIAGNOSIS — F32A Depression, unspecified: Secondary | ICD-10-CM | POA: Diagnosis not present

## 2021-05-24 DIAGNOSIS — I2510 Atherosclerotic heart disease of native coronary artery without angina pectoris: Secondary | ICD-10-CM | POA: Diagnosis not present

## 2021-05-24 DIAGNOSIS — E785 Hyperlipidemia, unspecified: Secondary | ICD-10-CM | POA: Diagnosis not present

## 2021-05-24 DIAGNOSIS — G40909 Epilepsy, unspecified, not intractable, without status epilepticus: Secondary | ICD-10-CM | POA: Diagnosis not present

## 2021-05-24 DIAGNOSIS — I119 Hypertensive heart disease without heart failure: Secondary | ICD-10-CM | POA: Diagnosis not present

## 2021-05-24 DIAGNOSIS — K8020 Calculus of gallbladder without cholecystitis without obstruction: Secondary | ICD-10-CM | POA: Diagnosis not present

## 2021-05-24 DIAGNOSIS — I82433 Acute embolism and thrombosis of popliteal vein, bilateral: Secondary | ICD-10-CM | POA: Diagnosis not present

## 2021-05-24 DIAGNOSIS — G510 Bell's palsy: Secondary | ICD-10-CM | POA: Diagnosis not present

## 2021-05-24 DIAGNOSIS — D649 Anemia, unspecified: Secondary | ICD-10-CM | POA: Diagnosis not present

## 2021-05-24 DIAGNOSIS — K219 Gastro-esophageal reflux disease without esophagitis: Secondary | ICD-10-CM | POA: Diagnosis not present

## 2021-05-24 DIAGNOSIS — J9601 Acute respiratory failure with hypoxia: Secondary | ICD-10-CM | POA: Diagnosis not present

## 2021-05-24 DIAGNOSIS — F410 Panic disorder [episodic paroxysmal anxiety] without agoraphobia: Secondary | ICD-10-CM | POA: Diagnosis not present

## 2021-05-24 NOTE — Telephone Encounter (Signed)
Thank you for the update. No additional recommendations.

## 2021-05-24 NOTE — Telephone Encounter (Signed)
Pt's husband, Kjerstin Abrigo notifying Dr. April Manson. Pt has been in the hospital due to blood clots in legs, lung and currently on oxygen. She is on Eliquis 5mg  twice a day want to let him know.

## 2021-05-27 DIAGNOSIS — G40909 Epilepsy, unspecified, not intractable, without status epilepticus: Secondary | ICD-10-CM | POA: Diagnosis not present

## 2021-05-27 DIAGNOSIS — J9601 Acute respiratory failure with hypoxia: Secondary | ICD-10-CM | POA: Diagnosis not present

## 2021-05-27 DIAGNOSIS — I2510 Atherosclerotic heart disease of native coronary artery without angina pectoris: Secondary | ICD-10-CM | POA: Diagnosis not present

## 2021-05-27 DIAGNOSIS — K645 Perianal venous thrombosis: Secondary | ICD-10-CM | POA: Diagnosis not present

## 2021-05-27 DIAGNOSIS — I2699 Other pulmonary embolism without acute cor pulmonale: Secondary | ICD-10-CM | POA: Diagnosis not present

## 2021-05-27 DIAGNOSIS — F410 Panic disorder [episodic paroxysmal anxiety] without agoraphobia: Secondary | ICD-10-CM | POA: Diagnosis not present

## 2021-05-27 DIAGNOSIS — D649 Anemia, unspecified: Secondary | ICD-10-CM | POA: Diagnosis not present

## 2021-05-27 DIAGNOSIS — I119 Hypertensive heart disease without heart failure: Secondary | ICD-10-CM | POA: Diagnosis not present

## 2021-05-27 DIAGNOSIS — K8020 Calculus of gallbladder without cholecystitis without obstruction: Secondary | ICD-10-CM | POA: Diagnosis not present

## 2021-05-27 DIAGNOSIS — I82433 Acute embolism and thrombosis of popliteal vein, bilateral: Secondary | ICD-10-CM | POA: Diagnosis not present

## 2021-05-27 DIAGNOSIS — G510 Bell's palsy: Secondary | ICD-10-CM | POA: Diagnosis not present

## 2021-05-27 DIAGNOSIS — F32A Depression, unspecified: Secondary | ICD-10-CM | POA: Diagnosis not present

## 2021-05-27 DIAGNOSIS — E785 Hyperlipidemia, unspecified: Secondary | ICD-10-CM | POA: Diagnosis not present

## 2021-05-27 DIAGNOSIS — K219 Gastro-esophageal reflux disease without esophagitis: Secondary | ICD-10-CM | POA: Diagnosis not present

## 2021-05-27 DIAGNOSIS — R7303 Prediabetes: Secondary | ICD-10-CM | POA: Diagnosis not present

## 2021-05-27 DIAGNOSIS — K2270 Barrett's esophagus without dysplasia: Secondary | ICD-10-CM | POA: Diagnosis not present

## 2021-05-31 DIAGNOSIS — H93293 Other abnormal auditory perceptions, bilateral: Secondary | ICD-10-CM | POA: Diagnosis not present

## 2021-05-31 DIAGNOSIS — Z85818 Personal history of malignant neoplasm of other sites of lip, oral cavity, and pharynx: Secondary | ICD-10-CM | POA: Diagnosis not present

## 2021-05-31 DIAGNOSIS — Z9049 Acquired absence of other specified parts of digestive tract: Secondary | ICD-10-CM | POA: Diagnosis not present

## 2021-06-01 DIAGNOSIS — K8020 Calculus of gallbladder without cholecystitis without obstruction: Secondary | ICD-10-CM | POA: Diagnosis not present

## 2021-06-01 DIAGNOSIS — K219 Gastro-esophageal reflux disease without esophagitis: Secondary | ICD-10-CM | POA: Diagnosis not present

## 2021-06-01 DIAGNOSIS — I119 Hypertensive heart disease without heart failure: Secondary | ICD-10-CM | POA: Diagnosis not present

## 2021-06-01 DIAGNOSIS — E785 Hyperlipidemia, unspecified: Secondary | ICD-10-CM | POA: Diagnosis not present

## 2021-06-01 DIAGNOSIS — J9601 Acute respiratory failure with hypoxia: Secondary | ICD-10-CM | POA: Diagnosis not present

## 2021-06-01 DIAGNOSIS — K645 Perianal venous thrombosis: Secondary | ICD-10-CM | POA: Diagnosis not present

## 2021-06-01 DIAGNOSIS — G510 Bell's palsy: Secondary | ICD-10-CM | POA: Diagnosis not present

## 2021-06-01 DIAGNOSIS — I2510 Atherosclerotic heart disease of native coronary artery without angina pectoris: Secondary | ICD-10-CM | POA: Diagnosis not present

## 2021-06-01 DIAGNOSIS — D649 Anemia, unspecified: Secondary | ICD-10-CM | POA: Diagnosis not present

## 2021-06-01 DIAGNOSIS — K2270 Barrett's esophagus without dysplasia: Secondary | ICD-10-CM | POA: Diagnosis not present

## 2021-06-01 DIAGNOSIS — I82433 Acute embolism and thrombosis of popliteal vein, bilateral: Secondary | ICD-10-CM | POA: Diagnosis not present

## 2021-06-01 DIAGNOSIS — I2699 Other pulmonary embolism without acute cor pulmonale: Secondary | ICD-10-CM | POA: Diagnosis not present

## 2021-06-01 DIAGNOSIS — F32A Depression, unspecified: Secondary | ICD-10-CM | POA: Diagnosis not present

## 2021-06-01 DIAGNOSIS — G40909 Epilepsy, unspecified, not intractable, without status epilepticus: Secondary | ICD-10-CM | POA: Diagnosis not present

## 2021-06-01 DIAGNOSIS — R7303 Prediabetes: Secondary | ICD-10-CM | POA: Diagnosis not present

## 2021-06-01 DIAGNOSIS — F410 Panic disorder [episodic paroxysmal anxiety] without agoraphobia: Secondary | ICD-10-CM | POA: Diagnosis not present

## 2021-06-03 DIAGNOSIS — F410 Panic disorder [episodic paroxysmal anxiety] without agoraphobia: Secondary | ICD-10-CM | POA: Diagnosis not present

## 2021-06-03 DIAGNOSIS — E785 Hyperlipidemia, unspecified: Secondary | ICD-10-CM | POA: Diagnosis not present

## 2021-06-03 DIAGNOSIS — R7303 Prediabetes: Secondary | ICD-10-CM | POA: Diagnosis not present

## 2021-06-03 DIAGNOSIS — K219 Gastro-esophageal reflux disease without esophagitis: Secondary | ICD-10-CM | POA: Diagnosis not present

## 2021-06-03 DIAGNOSIS — I2699 Other pulmonary embolism without acute cor pulmonale: Secondary | ICD-10-CM | POA: Diagnosis not present

## 2021-06-03 DIAGNOSIS — G510 Bell's palsy: Secondary | ICD-10-CM | POA: Diagnosis not present

## 2021-06-03 DIAGNOSIS — K2270 Barrett's esophagus without dysplasia: Secondary | ICD-10-CM | POA: Diagnosis not present

## 2021-06-03 DIAGNOSIS — F32A Depression, unspecified: Secondary | ICD-10-CM | POA: Diagnosis not present

## 2021-06-03 DIAGNOSIS — K8020 Calculus of gallbladder without cholecystitis without obstruction: Secondary | ICD-10-CM | POA: Diagnosis not present

## 2021-06-03 DIAGNOSIS — I2510 Atherosclerotic heart disease of native coronary artery without angina pectoris: Secondary | ICD-10-CM | POA: Diagnosis not present

## 2021-06-03 DIAGNOSIS — J9601 Acute respiratory failure with hypoxia: Secondary | ICD-10-CM | POA: Diagnosis not present

## 2021-06-03 DIAGNOSIS — K645 Perianal venous thrombosis: Secondary | ICD-10-CM | POA: Diagnosis not present

## 2021-06-03 DIAGNOSIS — G40909 Epilepsy, unspecified, not intractable, without status epilepticus: Secondary | ICD-10-CM | POA: Diagnosis not present

## 2021-06-03 DIAGNOSIS — I82433 Acute embolism and thrombosis of popliteal vein, bilateral: Secondary | ICD-10-CM | POA: Diagnosis not present

## 2021-06-03 DIAGNOSIS — D649 Anemia, unspecified: Secondary | ICD-10-CM | POA: Diagnosis not present

## 2021-06-03 DIAGNOSIS — I119 Hypertensive heart disease without heart failure: Secondary | ICD-10-CM | POA: Diagnosis not present

## 2021-06-07 ENCOUNTER — Encounter (HOSPITAL_COMMUNITY): Payer: Self-pay | Admitting: Radiology

## 2021-06-09 ENCOUNTER — Other Ambulatory Visit: Payer: Self-pay | Admitting: Family

## 2021-06-09 ENCOUNTER — Telehealth: Payer: Self-pay | Admitting: Family

## 2021-06-09 DIAGNOSIS — G510 Bell's palsy: Secondary | ICD-10-CM | POA: Diagnosis not present

## 2021-06-09 DIAGNOSIS — K2270 Barrett's esophagus without dysplasia: Secondary | ICD-10-CM | POA: Diagnosis not present

## 2021-06-09 DIAGNOSIS — I82433 Acute embolism and thrombosis of popliteal vein, bilateral: Secondary | ICD-10-CM | POA: Diagnosis not present

## 2021-06-09 DIAGNOSIS — G40909 Epilepsy, unspecified, not intractable, without status epilepticus: Secondary | ICD-10-CM | POA: Diagnosis not present

## 2021-06-09 DIAGNOSIS — F32A Depression, unspecified: Secondary | ICD-10-CM | POA: Diagnosis not present

## 2021-06-09 DIAGNOSIS — K219 Gastro-esophageal reflux disease without esophagitis: Secondary | ICD-10-CM | POA: Diagnosis not present

## 2021-06-09 DIAGNOSIS — F410 Panic disorder [episodic paroxysmal anxiety] without agoraphobia: Secondary | ICD-10-CM | POA: Diagnosis not present

## 2021-06-09 DIAGNOSIS — I119 Hypertensive heart disease without heart failure: Secondary | ICD-10-CM | POA: Diagnosis not present

## 2021-06-09 DIAGNOSIS — K645 Perianal venous thrombosis: Secondary | ICD-10-CM | POA: Diagnosis not present

## 2021-06-09 DIAGNOSIS — I2510 Atherosclerotic heart disease of native coronary artery without angina pectoris: Secondary | ICD-10-CM | POA: Diagnosis not present

## 2021-06-09 DIAGNOSIS — D649 Anemia, unspecified: Secondary | ICD-10-CM | POA: Diagnosis not present

## 2021-06-09 DIAGNOSIS — J9601 Acute respiratory failure with hypoxia: Secondary | ICD-10-CM | POA: Diagnosis not present

## 2021-06-09 DIAGNOSIS — R7303 Prediabetes: Secondary | ICD-10-CM | POA: Diagnosis not present

## 2021-06-09 DIAGNOSIS — K8020 Calculus of gallbladder without cholecystitis without obstruction: Secondary | ICD-10-CM | POA: Diagnosis not present

## 2021-06-09 DIAGNOSIS — I2699 Other pulmonary embolism without acute cor pulmonale: Secondary | ICD-10-CM | POA: Diagnosis not present

## 2021-06-09 DIAGNOSIS — E785 Hyperlipidemia, unspecified: Secondary | ICD-10-CM | POA: Diagnosis not present

## 2021-06-09 NOTE — Telephone Encounter (Signed)
Glenard Haring RN [phone] from [place] calling regarding  Status:  Clinical Findings need to be reported to physician

## 2021-06-09 NOTE — Telephone Encounter (Signed)
Make Glenard Haring aware best course would be to have patient report to the Emergency Department immediately for evaluation.   Patient with bilateral DVT's and pulmonary embolus during recent hospital admission 05/10/2021 - 05/14/2021. Subsequently patient referred to Vascular Surgery and Pulmonology on 05/20/2021.   Most recent chest x-ray 05/10/2021.   Most recent BNP screen for heart failure normal 05/10/2021.  Considering patient has bilateral lower extremity edema new onset heart failure and worsening DVT should be ruled out in addition to any other pertinent tests or labs deemed appropriate. Once patient discharged from hospital patient should schedule in-person appointment with me.

## 2021-06-10 ENCOUNTER — Other Ambulatory Visit: Payer: Self-pay

## 2021-06-10 ENCOUNTER — Encounter (HOSPITAL_BASED_OUTPATIENT_CLINIC_OR_DEPARTMENT_OTHER): Payer: BC Managed Care – PPO | Attending: Internal Medicine | Admitting: Internal Medicine

## 2021-06-10 DIAGNOSIS — Z7901 Long term (current) use of anticoagulants: Secondary | ICD-10-CM | POA: Insufficient documentation

## 2021-06-10 DIAGNOSIS — Z86718 Personal history of other venous thrombosis and embolism: Secondary | ICD-10-CM | POA: Insufficient documentation

## 2021-06-10 DIAGNOSIS — Z8589 Personal history of malignant neoplasm of other organs and systems: Secondary | ICD-10-CM | POA: Insufficient documentation

## 2021-06-10 DIAGNOSIS — R569 Unspecified convulsions: Secondary | ICD-10-CM | POA: Diagnosis not present

## 2021-06-10 DIAGNOSIS — L89152 Pressure ulcer of sacral region, stage 2: Secondary | ICD-10-CM | POA: Insufficient documentation

## 2021-06-10 DIAGNOSIS — C07 Malignant neoplasm of parotid gland: Secondary | ICD-10-CM | POA: Diagnosis not present

## 2021-06-10 DIAGNOSIS — L89212 Pressure ulcer of right hip, stage 2: Secondary | ICD-10-CM | POA: Insufficient documentation

## 2021-06-10 DIAGNOSIS — Z86711 Personal history of pulmonary embolism: Secondary | ICD-10-CM | POA: Diagnosis not present

## 2021-06-10 NOTE — Telephone Encounter (Signed)
Att to contact Glenard Haring, RN about response from provider no ans

## 2021-06-10 NOTE — Progress Notes (Addendum)
Melissa Case ([id number]) , Visit Report for 06/10/2021 Chief Complaint Document Details Patient Name: Date of Service: Melissa Case 06/10/2021 8:00 A M Medical Record Number: [id number] Patient Account Number: [id number] Date of Birth/Sex: Treating RN: 04-18-1961 (61 y.o. Elam Dutch Primary Care Provider: Durene Fruits Other Clinician: Referring Provider: Treating Provider/Extender: Wilber Oliphant, Amy Weeks in Treatment: 5 Information Obtained from: Patient Chief Complaint Multiple areas of skin breakdown to the sacrum and right ischium Electronic Signature(s) Signed: 06/10/2021 9:31:46 AM By: Kalman Shan DO Entered By: Kalman Shan on 06/10/2021 09:27:58 -------------------------------------------------------------------------------- HPI Details Patient Name: Date of Service: Melissa Case 06/10/2021 8:00 A M Medical Record Number: [id number] Patient Account Number: [id number] Date of Birth/Sex: Treating RN: 1960-11-02 (61 y.o. 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 She states that she has had debilitating seizures that have caused her to be less mobile. She uses a walker for ambulation. She states she mostly sits all day. She has been using mupirocin cream that was prescribed by her primary care provider. She reports mild tenderness to the areas. She denies signs of infection. 12/30; patient presents for follow-up. She was recently hospitalized from 12/20 just 12/24 for PEs and lower extremity  DVT She is currently on Eliquis. She s. has been using zinc oxide to the wound beds. She reports improvement in wound healing. She currently denies signs of infection. 1/20; patient presents for follow-up. She has been using zinc oxide to the wound beds with improvement in wound healing. She currently denies signs of infection. Electronic Signature(s) Signed: 06/10/2021 9:31:46 AM By: Kalman Shan DO Entered By: Kalman Shan on 06/10/2021 09:28:22 -------------------------------------------------------------------------------- Physical Exam Details Patient Name: Date of Service: Melissa Case 06/10/2021 8:00 A M Medical Record Number: [id number] Patient Account Number: [id number] Date of Birth/Sex: Treating RN: 12/09/1960 (61 y.o. Elam Dutch Primary Care Provider: Durene Fruits Other Clinician: Referring Provider: Treating Provider/Extender: Wilber Oliphant, Amy Weeks in Treatment: 5 Constitutional respirations regular, non-labored and within target range for patient.Marland Kitchen Psychiatric pleasant and cooperative. Notes T the sacrum/bilateral buttocks there are multiple areas of skin breakdown. No signs of infection. o Electronic Signature(s) Signed: 06/10/2021 9:31:46 AM By: Kalman Shan DO Entered By: Kalman Shan on 06/10/2021 09:28:56 -------------------------------------------------------------------------------- Physician Orders Details Patient Name: Date of Service: Melissa Case 06/10/2021 8:00 A M Medical Record Number: [id number] Patient Account Number: [id number] Date of Birth/Sex: Treating RN: [birth year] (61 y.o. Tonita Phoenix, Lauren Primary Care Provider: Durene Fruits Other Clinician: Referring Provider: Treating Provider/Extender: Wilber Oliphant, Amy Weeks in Treatment: 5 Verbal / Phone Orders: No Diagnosis Coding Follow-up Appointments ppointment in 2 weeks. - Dr. Heber [place] Return A Bathing/ Shower/ Hygiene May  shower with protection but do not get wound dressing(s) wet. Off-Loading Turn and reposition every 2 hours Wound Treatment Wound #1 - Sacrum Cleanser: Soap and Water 1 x Per Day/15 Days Discharge Instructions: May shower and wash wound with dial antibacterial soap and water prior to dressing change. Peri-Wound Care: Zinc Oxide Ointment 30g tube 1 x Per Day/15 Days Discharge Instructions: or Laverta Baltimore Cream  Topical: Gentamicin 1 x Per Day/15 Days Discharge Instructions: As directed by physician Secondary Dressing: ABD Pad, 5x9 (Generic) 1 x Per Day/15 Days Discharge Instructions: Apply over primary dressing as directed. Patient Medications llergies: morphine, Penicillins A Notifications Medication Indication Start End 06/10/2021 gentamicin DOSE 1 - topical 0.1 % cream - 1 application daily Electronic Signature(s) Signed: 06/10/2021 9:31:46 AM By: Kalman Shan DO Previous Signature: 06/10/2021 9:11:22 AM Version By: Kalman Shan DO Entered By: Kalman Shan on 06/10/2021 09:29:14 -------------------------------------------------------------------------------- Problem List Details Patient Name: Date of Service: Melissa Case 06/10/2021 8:00 A M Medical Record Number: [id number] Patient Account Number: [id number] Date of Birth/Sex: Treating RN: Sep 26, 1960 (61 y.o. Elam Dutch Primary Care Provider: Durene Fruits Other Clinician: Referring Provider: Treating Provider/Extender: Wilber Oliphant, Amy Weeks in Treatment: 5 Active Problems ICD-10 Encounter Code Description Active Date MDM Diagnosis L89.152 Pressure ulcer of sacral region, stage 2 05/06/2021 No Yes L89.212 Pressure ulcer of right hip, stage 2 05/06/2021 No Yes R56.9 Unspecified convulsions 05/06/2021 No Yes C07 Malignant neoplasm of parotid gland 05/06/2021 No Yes Inactive Problems Resolved Problems Electronic Signature(s) Signed: 06/10/2021 9:31:46 AM By: Kalman Shan  DO Entered By: Kalman Shan on 06/10/2021 09:27:36 -------------------------------------------------------------------------------- Progress Note Details Patient Name: Date of Service: Melissa Case 06/10/2021 8:00 A M Medical Record Number: [id number] Patient Account Number: [id number] Date of Birth/Sex: Treating RN: 04-26-1961 (61 y.o. 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 She states that she has had debilitating seizures that have caused her to be less mobile. She uses a walker for ambulation. She states she mostly sits all day. She has been using mupirocin cream that was prescribed by her primary care provider. She reports mild tenderness to the areas. She denies signs of infection. 12/30; patient presents for follow-up. She was recently hospitalized from 12/20 just 12/24 for PEs and lower extremity DVT She is currently on Eliquis. She s. has been using zinc oxide to the wound beds. She reports improvement in wound healing. She currently denies signs of infection. 1/20; patient presents for follow-up. She has been using zinc oxide to the wound beds with improvement in wound healing. She currently denies signs of infection. Patient History Information obtained from Patient, Chart. Family History Unknown History. Social History Current every day smoker - VAPORIZER, Marital Status - Married, Alcohol Use -  Never, Drug Use - No History, Caffeine Use - Never. Medical History Eyes Denies history of Cataracts, Glaucoma, Optic Neuritis Ear/Nose/Mouth/Throat Denies history of Chronic sinus problems/congestion, Middle ear problems Hematologic/Lymphatic Denies history of Anemia, Hemophilia, Human Immunodeficiency Virus, Lymphedema, Sickle Cell Disease Respiratory Denies history of Aspiration, Asthma, Chronic Obstructive Pulmonary Disease (COPD), Pneumothorax, Sleep Apnea, Tuberculosis Cardiovascular Denies history of Angina, Arrhythmia, Congestive Heart Failure, Coronary Artery Disease, Deep Vein Thrombosis, Hypertension, Hypotension, Myocardial Infarction, Peripheral Arterial Disease, Peripheral Venous Disease, Phlebitis, Vasculitis Gastrointestinal Denies history of Cirrhosis , Colitis, Crohnoos, Hepatitis A, Hepatitis B, Hepatitis C Genitourinary Denies history of End Stage Renal Disease Immunological Denies history of Lupus Erythematosus,  Raynaudoos, Scleroderma Integumentary (Skin) Denies history of History of Burn Musculoskeletal Denies history of Gout, Rheumatoid Arthritis, Osteoarthritis, Osteomyelitis Neurologic Patient has history of Seizure Disorder Denies history of Dementia, Neuropathy, Quadriplegia, Paraplegia Oncologic Denies history of Received Chemotherapy, Received Radiation Psychiatric Denies history of Anorexia/bulimia, Confinement Anxiety Hospitalization/Surgery History - ABDOMINAL HYSTERECTOMY. - CHOLECYSTECTOMY. - MULTIPLE ETRACTIONS WITH ALVEOPLASTY. - PARATIDECTOMY. Medical A Surgical History Notes nd Gastrointestinal GERD Objective Constitutional respirations regular, non-labored and within target range for patient.. Vitals Time Taken: 8:09 AM, Height: 64 in, Weight: 167 lbs, BMI: 28.7, Temperature: 97.7 F, Pulse: 76 bpm, Respiratory Rate: 17 breaths/min, Blood Pressure: 117/81 mmHg. Psychiatric pleasant and cooperative. General Notes: T the  sacrum/bilateral buttocks there are multiple areas of skin breakdown. No signs of infection. o Integumentary (Hair, Skin) Wound #1 status is Open. Original cause of wound was Pressure Injury. The date acquired was: 04/21/2021. The wound has been in treatment 5 weeks. The wound is located on the Sacrum. The wound measures 11cm length x 15cm width x 0.1cm depth; 129.591cm^2 area and 12.959cm^3 volume. There is Fat Layer (Subcutaneous Tissue) exposed. There is no tunneling or undermining noted. There is a medium amount of serosanguineous drainage noted. The wound margin is distinct with the outline attached to the wound base. There is large (67-100%) red, pink granulation within the wound bed. There is no necrotic tissue within the wound bed. Assessment Active Problems ICD-10 Pressure ulcer of sacral region, stage 2 Pressure ulcer of right hip, stage 2 Unspecified convulsions Malignant neoplasm of parotid gland Patient has less scattered wounds present today. They are limited to skin breakdown. No surrounding soft tissue infection. At this time I recommended adding gentamicin ointment to address any bioburden to the areas to see if this will help close the wounds, Along with zinc oxide. If the areas are still present I would recommend antifungal cream at next clinic visit. Plan Follow-up Appointments: Return Appointment in 2 weeks. - Dr. Heber Baudette Bathing/ Shower/ Hygiene: May shower with protection but do not get wound dressing(s) wet. Off-Loading: Turn and reposition every 2 hours The following medication(s) was prescribed: gentamicin topical 0.1 % cream 1 1 application daily starting 06/10/2021 WOUND #1: - Sacrum Wound Laterality: Cleanser: Soap and Water 1 x Per Day/15 Days Discharge Instructions: May shower and wash wound with dial antibacterial soap and water prior to dressing change. Peri-Wound Care: Zinc Oxide Ointment 30g tube 1 x Per Day/15 Days Discharge Instructions: or Laverta Baltimore Cream Topical: Gentamicin 1 x Per Day/15 Days Discharge Instructions: As directed by physician Secondary Dressing: ABD Pad, 5x9 (Generic) 1 x Per Day/15 Days Discharge Instructions: Apply over primary dressing as directed. 1. Zinc oxide and gentamicin ointment 2. Follow-up in 2 weeks Electronic Signature(s) Signed: 06/10/2021 9:31:46 AM By: Kalman Shan DO Entered By: Kalman Shan on 06/10/2021 09:30:43 -------------------------------------------------------------------------------- HxROS Details Patient Name: Date of Service: Melissa Case 06/10/2021 8:00 A M Medical Record Number: [id number] Patient Account Number: [id number] Date of Birth/Sex: Treating RN: [birth year] (61 y.o. Elam Dutch Primary Care Provider: Durene Fruits Other Clinician: Referring Provider: Treating Provider/Extender: Wilber Oliphant, Amy Weeks in Treatment: 5 Information Obtained From Patient Chart Eyes Medical History: Negative for: Cataracts; Glaucoma; Optic Neuritis Ear/Nose/Mouth/Throat Medical History: Negative for: Chronic sinus problems/congestion; Middle ear problems Hematologic/Lymphatic Medical History: Negative for: Anemia; Hemophilia; Human Immunodeficiency Virus; Lymphedema; Sickle Cell Disease Respiratory Medical History: Negative for: Aspiration; Asthma; Chronic Obstructive Pulmonary Disease (COPD); Pneumothorax; Sleep Apnea; Tuberculosis Cardiovascular Medical History: Negative for: Angina; Arrhythmia;  Congestive Heart Failure; Coronary Artery Disease; Deep Vein Thrombosis; Hypertension; Hypotension; Myocardial Infarction; Peripheral Arterial Disease; Peripheral Venous Disease; Phlebitis; Vasculitis Gastrointestinal Medical History: Negative for: Cirrhosis ; Colitis; Crohns; Hepatitis A; Hepatitis B; Hepatitis C Past Medical History Notes: GERD Genitourinary Medical History: Negative for: End Stage Renal Disease Immunological Medical  History: Negative for: Lupus Erythematosus; Raynauds; Scleroderma Integumentary (Skin) Medical History: Negative for: History of Burn Musculoskeletal Medical History: Negative for: Gout; Rheumatoid Arthritis; Osteoarthritis; Osteomyelitis Neurologic Medical History: Positive for: Seizure Disorder Negative for: Dementia; Neuropathy; Quadriplegia; Paraplegia Oncologic Medical History: Negative for: Received Chemotherapy; Received Radiation Psychiatric Medical History: Negative for: Anorexia/bulimia; Confinement Anxiety Immunizations Implantable Devices None Hospitalization / Surgery History Type of Hospitalization/Surgery ABDOMINAL HYSTERECTOMY CHOLECYSTECTOMY MULTIPLE ETRACTIONS WITH ALVEOPLASTY PARATIDECTOMY Family and Social History Unknown History: Yes; Current every day smoker - VAPORIZER; Marital Status - Married; Alcohol Use: Never; Drug Use: No History; Caffeine Use: Never; Financial Concerns: No; Food, Clothing or Shelter Needs: No; Support System Lacking: No; Transportation Concerns: No Electronic Signature(s) Signed: 06/10/2021 9:31:46 AM By: Kalman Shan DO Signed: 06/10/2021 1:05:14 PM By: Baruch Gouty RN, BSN Entered By: Kalman Shan on 06/10/2021 09:28:32 -------------------------------------------------------------------------------- [place] Details Patient Name: Date of Service: Melissa Case 06/10/2021 Medical Record Number: [id number] Patient Account Number: [id number] Date of Birth/Sex: Treating RN: 01-18-1961 (61 y.o. Elam Dutch Primary Care Provider: Durene Fruits Other Clinician: Referring Provider: Treating Provider/Extender: Wilber Oliphant, Amy Weeks in Treatment: 5 Diagnosis Coding ICD-10 Codes Code Description [id number] Pressure ulcer of sacral region, stage 2 L89.212 Pressure ulcer of right hip, stage 2 R56.9 Unspecified convulsions C07 Malignant neoplasm of parotid gland Facility Procedures CPT4 Code:  [id number] Description: 9213 - WOUND CARE VISIT-LEV 3 EST PT Modifier: Quantity: 1 Physician Procedures : CPT4 Code Description Modifier [id number] - WC PHYS LEVEL 3 - EST PT ICD-10 Diagnosis Description L89.152 Pressure ulcer of sacral region, stage 2 L89.212 Pressure ulcer of right hip, stage 2 R56.9 Unspecified convulsions C07 Malignant neoplasm of  parotid gland Quantity: 1 Electronic Signature(s) Signed: 06/14/2021 8:45:57 AM By: Kalman Shan DO Signed: 06/16/2021 5:37:29 PM By: Rhae Hammock RN Previous Signature: 06/10/2021 9:31:46 AM Version By: Kalman Shan DO Entered By: Rhae Hammock on 06/14/2021 07:18:40

## 2021-06-13 ENCOUNTER — Encounter (HOSPITAL_COMMUNITY): Payer: Self-pay

## 2021-06-13 ENCOUNTER — Emergency Department (HOSPITAL_COMMUNITY)
Admission: EM | Admit: 2021-06-13 | Discharge: 2021-06-13 | Disposition: A | Discharge status: Home / Self Care | Payer: BC Managed Care – PPO | Attending: Emergency Medicine | Admitting: Emergency Medicine

## 2021-06-13 ENCOUNTER — Emergency Department (HOSPITAL_COMMUNITY): Payer: BC Managed Care – PPO

## 2021-06-13 ENCOUNTER — Emergency Department (HOSPITAL_BASED_OUTPATIENT_CLINIC_OR_DEPARTMENT_OTHER): Payer: BC Managed Care – PPO

## 2021-06-13 DIAGNOSIS — R600 Localized edema: Secondary | ICD-10-CM | POA: Diagnosis not present

## 2021-06-13 DIAGNOSIS — I824Y9 Acute embolism and thrombosis of unspecified deep veins of unspecified proximal lower extremity: Secondary | ICD-10-CM

## 2021-06-13 DIAGNOSIS — E875 Hyperkalemia: Secondary | ICD-10-CM | POA: Diagnosis not present

## 2021-06-13 DIAGNOSIS — R2243 Localized swelling, mass and lump, lower limb, bilateral: Secondary | ICD-10-CM | POA: Insufficient documentation

## 2021-06-13 DIAGNOSIS — R609 Edema, unspecified: Secondary | ICD-10-CM

## 2021-06-13 DIAGNOSIS — R0602 Shortness of breath: Secondary | ICD-10-CM | POA: Diagnosis not present

## 2021-06-13 DIAGNOSIS — R079 Chest pain, unspecified: Secondary | ICD-10-CM | POA: Diagnosis not present

## 2021-06-13 DIAGNOSIS — Z7901 Long term (current) use of anticoagulants: Secondary | ICD-10-CM | POA: Insufficient documentation

## 2021-06-13 LAB — COMPREHENSIVE METABOLIC PANEL
ALT: 34 U/L (ref 0–44)
AST: 53 U/L — ABNORMAL HIGH (ref 15–41)
Albumin: 2.9 g/dL — ABNORMAL LOW (ref 3.5–5.0)
Alkaline Phosphatase: 30 U/L — ABNORMAL LOW (ref 38–126)
Anion gap: 6 (ref 5–15)
BUN: 19 mg/dL (ref 6–20)
CO2: 31 mmol/L (ref 22–32)
Calcium: 8.2 mg/dL — ABNORMAL LOW (ref 8.9–10.3)
Chloride: 97 mmol/L — ABNORMAL LOW (ref 98–111)
Creatinine, Ser: 0.6 mg/dL (ref 0.44–1.00)
GFR, Estimated: >60 mL/min (ref >60)
Glucose, Bld: 109 mg/dL — ABNORMAL HIGH (ref 70–99)
Potassium: 6.1 mmol/L — ABNORMAL HIGH (ref 3.5–5.1)
Sodium: 134 mmol/L — ABNORMAL LOW (ref 135–145)
Total Bilirubin: 1.7 mg/dL — ABNORMAL HIGH (ref 0.3–1.2)
Total Protein: 5.7 g/dL — ABNORMAL LOW (ref 6.5–8.1)

## 2021-06-13 LAB — BRAIN NATRIURETIC PEPTIDE: B Natriuretic Peptide: 43.5 pg/mL (ref 0.0–100.0)

## 2021-06-13 LAB — CBC WITH DIFFERENTIAL/PLATELET
Abs Immature Granulocytes: 0.23 10*3/uL — ABNORMAL HIGH (ref 0.00–0.07)
Basophils Absolute: 0 10*3/uL (ref 0.0–0.1)
Basophils Relative: 0 %
Eosinophils Absolute: 0 10*3/uL (ref 0.0–0.5)
Eosinophils Relative: 0 %
HCT: 33.2 % — ABNORMAL LOW (ref 36.0–46.0)
Hemoglobin: 10.6 g/dL — ABNORMAL LOW (ref 12.0–15.0)
Immature Granulocytes: 5 %
Lymphocytes Relative: 11 %
Lymphs Abs: 0.5 10*3/uL — ABNORMAL LOW (ref 0.7–4.0)
MCH: 33.4 pg (ref 26.0–34.0)
MCHC: 31.9 g/dL (ref 30.0–36.0)
MCV: 104.7 fL — ABNORMAL HIGH (ref 80.0–100.0)
Monocytes Absolute: 0.4 10*3/uL (ref 0.1–1.0)
Monocytes Relative: 9 %
Neutro Abs: 3.6 10*3/uL (ref 1.7–7.7)
Neutrophils Relative %: 75 %
Platelets: 149 10*3/uL — ABNORMAL LOW (ref 150–400)
RBC: 3.17 MIL/uL — ABNORMAL LOW (ref 3.87–5.11)
RDW: 14.6 % (ref 11.5–15.5)
WBC: 4.8 10*3/uL (ref 4.0–10.5)
nRBC: 0.4 % — ABNORMAL HIGH (ref 0.0–0.2)

## 2021-06-13 LAB — I-STAT CHEM 8, ED
BUN: 23 mg/dL — ABNORMAL HIGH (ref 6–20)
Calcium, Ion: 1.05 mmol/L — ABNORMAL LOW (ref 1.15–1.40)
Chloride: 97 mmol/L — ABNORMAL LOW (ref 98–111)
Creatinine, Ser: 0.5 mg/dL (ref 0.44–1.00)
Glucose, Bld: 105 mg/dL — ABNORMAL HIGH (ref 70–99)
HCT: 31 % — ABNORMAL LOW (ref 36.0–46.0)
Hemoglobin: 10.5 g/dL — ABNORMAL LOW (ref 12.0–15.0)
Potassium: 6.4 mmol/L (ref 3.5–5.1)
Sodium: 132 mmol/L — ABNORMAL LOW (ref 135–145)
TCO2: 34 mmol/L — ABNORMAL HIGH (ref 22–32)

## 2021-06-13 MED ORDER — FUROSEMIDE 10 MG/ML IJ SOLN
20.0000 mg | Freq: Once | INTRAMUSCULAR | Status: AC
  Administered 2021-06-13: 20 mg via INTRAVENOUS
  Filled 2021-06-13: qty 4

## 2021-06-13 MED ORDER — FUROSEMIDE 20 MG PO TABS: 40.0000 mg | ORAL_TABLET | Freq: Every day | ORAL | 0 refills | Status: DC

## 2021-06-13 NOTE — ED Triage Notes (Signed)
Pt arrived via POV, c/o bilateral leg pain and swelling. Pain worsening with palpation. States pain and swelling progressing for about a week.

## 2021-06-13 NOTE — Discharge Instructions (Signed)
Follow-up with your doctor in the office.  Please return for worsening difficulty breathing.

## 2021-06-13 NOTE — ED Provider Notes (Signed)
[facility] Provider Note   CSN: [id number] Arrival date & time: 06/13/21  0935     History  Chief Complaint  Patient presents with   Leg Swelling    Melissa Case is a 61 y.o. female.  61 yo F with a chief complaints of bilateral lower extremity edema.  The patient was hospitalized for high clot burden pulmonary embolism and was started on Eliquis.  She did experience some significant leg swelling during that visit and had some improvement while hospitalized but worsening when she was discharged.  And seen her family doctor in the office and was started on diuretics.  Family feels like the swelling is gotten worse and now the swelling is above the knees.  She is also had some discomfort with the amount of swelling in her legs.  She does not feel like her trouble breathing is gotten that much worse.  She was discharged in the hospital on 4 L of oxygen.  The history is provided by the patient and the spouse.  Illness Severity:  Moderate Onset quality:  Gradual Duration:  2 weeks Timing:  Constant Progression:  Worsening Chronicity:  New Associated symptoms: shortness of breath   Associated symptoms: no chest pain, no congestion, no fever, no headaches, no myalgias, no nausea, no rhinorrhea, no vomiting and no wheezing       Home Medications Prior to Admission medications   Medication Sig Start Date End Date Taking? Authorizing Provider  apixaban (ELIQUIS) 5 MG TABS tablet Take 2 tablets (10 mg total) by mouth 2 (two) times daily for 4 days. 05/14/21 05/18/21  Derrel Nip, MD  apixaban (ELIQUIS) 5 MG TABS tablet Take 1 tablet (5 mg total) by mouth 2 (two) times daily. 06/16/21   Rema Fendt, NP  dexamethasone (DECADRON) 4 MG tablet Take 1 tablet (4 mg total) by mouth every 12 (twelve) hours. 05/14/21   Derrel Nip, MD  divalproex (DEPAKOTE) 250 MG DR tablet Take 1 tablet (250 mg total) by mouth 2 (two) times daily. (In addition to  500mg  twice daily for a total dosage of 750mg  BID). 04/19/21   Windell Norfolk, MD  divalproex (DEPAKOTE) 500 MG DR tablet Take 1 tablet (500 mg total) by mouth 2 (two) times daily. 03/16/21 05/10/21  Windell Norfolk, MD  esomeprazole (NEXIUM) 40 MG capsule Take 1 capsule by mouth once daily 05/03/21   Tressia Danas, MD  furosemide (LASIX) 20 MG tablet Take 2 tablets (40 mg total) by mouth daily for 5 days. 06/13/21 06/18/21  Melene Plan, DO  levETIRAcetam (KEPPRA) 500 MG tablet Take 1 tablet (500 mg total) by mouth 2 (two) times daily. 03/28/21 05/10/21  Windell Norfolk, MD  nicotine (NICODERM CQ - DOSED IN MG/24 HR) 7 mg/24hr patch Place 1 patch (7 mg total) onto the skin daily. 05/15/21   Derrel Nip, MD  Nystatin (GERHARDT'S BUTT CREAM) CREA Apply 1 application topically 3 (three) times daily. 05/14/21   Derrel Nip, MD  PARoxetine (PAXIL) 40 MG tablet Take 1 tablet (40 mg total) by mouth daily. 04/01/21   Cleotis Nipper, MD      Allergies    Morphine and Penicillins    Review of Systems   Review of Systems  Constitutional:  Negative for chills and fever.  HENT:  Negative for congestion and rhinorrhea.   Eyes:  Negative for redness and visual disturbance.  Respiratory:  Positive for shortness of breath. Negative for wheezing.   Cardiovascular:  Positive for leg swelling.  Negative for chest pain and palpitations.  Gastrointestinal:  Negative for nausea and vomiting.  Genitourinary:  Negative for dysuria and urgency.  Musculoskeletal:  Negative for arthralgias and myalgias.  Skin:  Negative for pallor and wound.  Neurological:  Negative for dizziness and headaches.   Physical Exam Updated Vital Signs BP 122/73    Pulse 77    Temp 98 F (36.7 C) (Oral)    Resp 19    SpO2 99%  Physical Exam Vitals and nursing note reviewed.  Constitutional:      General: She is not in acute distress.    Appearance: She is well-developed. She is not diaphoretic.  HENT:     Head:  Normocephalic and atraumatic.  Eyes:     Pupils: Pupils are equal, round, and reactive to light.  Cardiovascular:     Rate and Rhythm: Normal rate and regular rhythm.     Heart sounds: No murmur heard.   No friction rub. No gallop.  Pulmonary:     Effort: Pulmonary effort is normal.     Breath sounds: No wheezing or rales.  Abdominal:     General: There is no distension.     Palpations: Abdomen is soft.     Tenderness: There is no abdominal tenderness.  Musculoskeletal:        General: No tenderness.     Cervical back: Normal range of motion and neck supple.     Right lower leg: Edema present.     Left lower leg: Edema present.     Comments: 3+ edema to the mid thigh bilaterally.  Skin:    General: Skin is warm and dry.  Neurological:     Mental Status: She is alert and oriented to person, place, and time.  Psychiatric:        Behavior: Behavior normal.    ED Results / Procedures / Treatments   Labs (all labs ordered are listed, but only abnormal results are displayed) Labs Reviewed  CBC WITH DIFFERENTIAL/PLATELET - Abnormal; Notable for the following components:      Result Value   RBC 3.17 (*)    Hemoglobin 10.6 (*)    HCT 33.2 (*)    MCV 104.7 (*)    Platelets 149 (*)    nRBC 0.4 (*)    Lymphs Abs 0.5 (*)    Abs Immature Granulocytes 0.23 (*)    All other components within normal limits  COMPREHENSIVE METABOLIC PANEL - Abnormal; Notable for the following components:   Sodium 134 (*)    Potassium 6.1 (*)    Chloride 97 (*)    Glucose, Bld 109 (*)    Calcium 8.2 (*)    Total Protein 5.7 (*)    Albumin 2.9 (*)    AST 53 (*)    Alkaline Phosphatase 30 (*)    Total Bilirubin 1.7 (*)    All other components within normal limits  I-STAT CHEM 8, ED - Abnormal; Notable for the following components:   Sodium 132 (*)    Potassium 6.4 (*)    Chloride 97 (*)    BUN 23 (*)    Glucose, Bld 105 (*)    Calcium, Ion 1.05 (*)    TCO2 34 (*)    Hemoglobin 10.5 (*)     HCT 31.0 (*)    All other components within normal limits  BRAIN NATRIURETIC PEPTIDE    EKG EKG Interpretation  Date/Time:  Monday June 13 2021 10:12:13 EST Ventricular Rate:  83 PR  Interval:  149 QRS Duration: 88 QT Interval:  356 QTC Calculation: 419 R Axis:   0 Text Interpretation: Sinus rhythm Minimal ST elevation, inferior leads No significant change since last tracing Confirmed by Melene Plan [phone]) on 06/13/2021 10:50:44 AM  Radiology DG Chest Port 1 View  Result Date: 06/13/2021 CLINICAL DATA:  Shortness of breath, bilateral leg pain and swelling for 1 week EXAM: PORTABLE CHEST 1 VIEW COMPARISON:  Chest radiograph 05/10/2021 FINDINGS: The cardiac silhouette is prominent, exaggerated by AP technique and rightward patient rotation. The upper mediastinal contours are also exaggerated by rotation. Lung volumes are low with slight asymmetric elevation of the right hemidiaphragm. Linear opacity in the right midlung may reflect atelectasis or scar. There is no focal consolidation or pulmonary edema. There is no pleural effusion or pneumothorax. There is no acute osseous abnormality. IMPRESSION: Linear opacity in the right midlung may reflect atelectasis or scar. Otherwise, no focal consolidation or pleural effusion. Electronically Signed   By: Lesia Hausen M.D.   On: 06/13/2021 11:16   VAS [place] LOWER EXTREMITY VENOUS (DVT)  Result Date: 06/13/2021  Lower Venous DVT Study Patient Name:  BARBARAJEAN GOULART  Date of Exam:   06/13/2021 Medical Rec #: [id number]       Accession #:    [id number] Date of Birth: May 30, 1960       Patient Gender: F Patient Age:   74 years Exam Location[facility]  [facility] Procedure:      VAS [place] LOWER EXTREMITY VENOUS (DVT) Referring Phys: Aldan Camey --------------------------------------------------------------------------------  Indications: Edema.  Risk Factors: DVT & PE - 05/10/2021. Anticoagulation: Eliquis. Limitations: Body habitus, poor ultrasound/tissue interface  and diffuse subcutaneous edema. Comparison Study: Previous exam on 05/10/2021 was positive for age indeterminate                   DVT in BLE (PopV, PTV, & PeroV) & LLE PFV Performing Technologist: Jody Hill RVT, RDMS  Examination Guidelines: A complete evaluation includes B-mode imaging, spectral Doppler, color Doppler, and power Doppler as needed of all accessible portions of each vessel. Bilateral testing is considered an integral part of a complete examination. Limited examinations for reoccurring indications may be performed as noted. The reflux portion of the exam is performed with the patient in reverse Trendelenburg.  +---------+---------------+---------+-----------+----------+-------------------+  RIGHT     Compressibility Phasicity Spontaneity Properties Thrombus Aging       +---------+---------------+---------+-----------+----------+-------------------+  CFV       Full            Yes       Yes                                         +---------+---------------+---------+-----------+----------+-------------------+  SFJ       Full                                                                  +---------+---------------+---------+-----------+----------+-------------------+  FV Prox   Full            Yes       Yes                                         +---------+---------------+---------+-----------+----------+-------------------+  FV Mid    Full            Yes       Yes                                         +---------+---------------+---------+-----------+----------+-------------------+  FV Distal Full            Yes       Yes                                         +---------+---------------+---------+-----------+----------+-------------------+  PFV       Full                                                                  +---------+---------------+---------+-----------+----------+-------------------+  POP       Partial         Yes       Yes                    Chronic               +---------+---------------+---------+-----------+----------+-------------------+  PTV       Full                                             Not well visualized  +---------+---------------+---------+-----------+----------+-------------------+  PERO                                                       Not visualized       +---------+---------------+---------+-----------+----------+-------------------+   Right Technical Findings: Not visualized segments include peroneal veins.  +---------+---------------+---------+-----------+----------+-------------------+  LEFT      Compressibility Phasicity Spontaneity Properties Thrombus Aging       +---------+---------------+---------+-----------+----------+-------------------+  CFV       Full            Yes       Yes                                         +---------+---------------+---------+-----------+----------+-------------------+  SFJ       Full                                                                  +---------+---------------+---------+-----------+----------+-------------------+  FV Prox   Full            Yes       Yes                                         +---------+---------------+---------+-----------+----------+-------------------+  FV Mid    Full            Yes       Yes                                         +---------+---------------+---------+-----------+----------+-------------------+  FV Distal Full            Yes       Yes                                         +---------+---------------+---------+-----------+----------+-------------------+  PFV       Partial         Yes       Yes                    Chronic              +---------+---------------+---------+-----------+----------+-------------------+  POP       Partial         Yes       Yes                    Chronic              +---------+---------------+---------+-----------+----------+-------------------+  PTV       Full                                             Not well visualized   +---------+---------------+---------+-----------+----------+-------------------+  PERO      Full                                             Not well visualized  +---------+---------------+---------+-----------+----------+-------------------+    Summary: BILATERAL: - No evidence of superficial venous thrombosis in the lower extremities, bilaterally. -No evidence of popliteal cyst, bilaterally. -Diffuse subcutaneous edema, bilaterally. RIGHT: - Findings consistent with chronic deep vein thrombosis involving the right popliteal vein. - Findings appear improved from previous examination.  LEFT: - Findings consistent with chronic deep vein thrombosis involving the left popliteal vein, and left proximal profunda vein. - Findings appear improved from previous examination.  *See table(s) above for measurements and observations.    Preliminary     Procedures Procedures    Medications Ordered in ED Medications  furosemide (LASIX) injection 20 mg (20 mg Intravenous Given 06/13/21 1312)    ED Course/ Medical Decision Making/ A&P                           Medical Decision Making Amount and/or Complexity of Data Reviewed Labs: ordered. Radiology: ordered. ECG/medicine tests: ordered.  Risk Prescription drug management.   61 yo F with a chief complaints of lower extremity edema.  This been going on since she was hospitalized for a PE.  She had some improvement in the hospital but had recurrence when she was at home.  She was discharged on 4 L of oxygen when I reviewed the records.  I get some further history with the husband.  He feels  like her breathing has gotten much worse.  She has not had an issue taking her Eliquis.  He is a bit frustrated they have had difficulty getting back into see the family physician.  Based on the history I suspect this is most likely dependent edema.  The patient seems somewhat debilitated at baseline now with history of pulmonary embolism and on 4 L of oxygen she has spent  a lot of time in a chair.  We will obtain a laboratory evaluation chest x-ray.  There is an off chance that she has bilateral DVTs though I think this is less likely especially with her on Eliquis but will obtain a DVT study.  DVT study with bilateral DVTs but improved from prior study.  No acute DVT found.  Chest x-ray viewed by me without focal infiltrate, no obvious edema.  BNP is normal.  No renal dysfunction.  Potassium is mildly elevated which I suspect is likely hemolysis.  No concerning EKG findings for hyperkalemia.  Given bolus dose of Lasix here.  Will increase Lasix at home.  Discussed mobilization which it sounds like the patient has been doing at home with therapy and has had some improvement.  PCP follow-up.  1:36 PM:  I have discussed the diagnosis/risks/treatment options with the patient and believe the pt to be eligible for discharge home to follow-up with PCP. We also discussed returning to the ED immediately if new or worsening sx occur. We discussed the sx which are most concerning (e.g., sudden worsening sob, fever, inability to tolerate by mouth) that necessitate immediate return. Medications administered to the patient during their visit and any new prescriptions provided to the patient are listed below.  Medications given during this visit Medications  furosemide (LASIX) injection 20 mg (20 mg Intravenous Given 06/13/21 1312)     The patient appears reasonably screen and/or stabilized for discharge and I doubt any other medical condition or other [facility] requiring further screening, evaluation, or treatment in the ED at this time prior to discharge.          Final Clinical Impression(s) / ED Diagnoses Final diagnoses:  Bilateral lower extremity edema    Rx / DC Orders ED Discharge Orders          Ordered    furosemide (LASIX) 20 MG tablet  Daily        06/13/21 1334              Melene Plan, DO 06/13/21 1336

## 2021-06-13 NOTE — Progress Notes (Incomplete)
{  Select Note:[id number]} 

## 2021-06-14 DIAGNOSIS — R7303 Prediabetes: Secondary | ICD-10-CM | POA: Diagnosis not present

## 2021-06-14 DIAGNOSIS — G40909 Epilepsy, unspecified, not intractable, without status epilepticus: Secondary | ICD-10-CM | POA: Diagnosis not present

## 2021-06-14 DIAGNOSIS — J9601 Acute respiratory failure with hypoxia: Secondary | ICD-10-CM | POA: Diagnosis not present

## 2021-06-14 DIAGNOSIS — F32A Depression, unspecified: Secondary | ICD-10-CM | POA: Diagnosis not present

## 2021-06-14 DIAGNOSIS — I119 Hypertensive heart disease without heart failure: Secondary | ICD-10-CM | POA: Diagnosis not present

## 2021-06-14 DIAGNOSIS — K2270 Barrett's esophagus without dysplasia: Secondary | ICD-10-CM | POA: Diagnosis not present

## 2021-06-14 DIAGNOSIS — K219 Gastro-esophageal reflux disease without esophagitis: Secondary | ICD-10-CM | POA: Diagnosis not present

## 2021-06-14 DIAGNOSIS — I2510 Atherosclerotic heart disease of native coronary artery without angina pectoris: Secondary | ICD-10-CM | POA: Diagnosis not present

## 2021-06-14 DIAGNOSIS — G510 Bell's palsy: Secondary | ICD-10-CM | POA: Diagnosis not present

## 2021-06-14 DIAGNOSIS — I82433 Acute embolism and thrombosis of popliteal vein, bilateral: Secondary | ICD-10-CM | POA: Diagnosis not present

## 2021-06-14 DIAGNOSIS — E785 Hyperlipidemia, unspecified: Secondary | ICD-10-CM | POA: Diagnosis not present

## 2021-06-14 DIAGNOSIS — I2699 Other pulmonary embolism without acute cor pulmonale: Secondary | ICD-10-CM | POA: Diagnosis not present

## 2021-06-14 DIAGNOSIS — K645 Perianal venous thrombosis: Secondary | ICD-10-CM | POA: Diagnosis not present

## 2021-06-14 DIAGNOSIS — D649 Anemia, unspecified: Secondary | ICD-10-CM | POA: Diagnosis not present

## 2021-06-14 DIAGNOSIS — K8020 Calculus of gallbladder without cholecystitis without obstruction: Secondary | ICD-10-CM | POA: Diagnosis not present

## 2021-06-14 DIAGNOSIS — F410 Panic disorder [episodic paroxysmal anxiety] without agoraphobia: Secondary | ICD-10-CM | POA: Diagnosis not present

## 2021-06-16 NOTE — Progress Notes (Signed)
red, brown N/A N/A Exudate Color: Distinct, outline attached N/A N/A Wound Margin: Large (67-100%) N/A N/A Granulation A mount: Red, Pink N/A N/A Granulation Quality: None Present (0%) N/A N/A Necrotic A mount: Fat Layer (Subcutaneous Tissue): Yes N/A N/A Exposed Structures: Fascia: No Tendon: No Muscle: No Joint: No Bone: No Large (67-100%) N/A N/A Epithelialization: Treatment Notes Wound #1 (Sacrum) Cleanser Soap and Water Discharge Instruction: May shower and wash wound with dial antibacterial soap and water prior to dressing change. Peri-Wound Care Zinc Oxide Ointment 30g tube Discharge Instruction: or Laverta Baltimore Cream Topical Gentamicin Discharge Instruction: As directed by physician Primary Dressing Secondary Dressing ABD Pad, 5x9 Discharge Instruction: Apply over primary dressing as directed. Secured With Compression Wrap Compression Stockings Environmental education officer) Signed: 06/10/2021 9:31:46 AM By: Kalman Shan DO Signed: 06/10/2021 1:05:14 PM By: Baruch Gouty RN, BSN Entered By: Kalman Shan on 06/10/2021 09:27:43 -------------------------------------------------------------------------------- Multi-Disciplinary Care Plan Details Patient Name: Date of Service: Melissa Case 06/10/2021 8:00 A M Medical Record Number: [id number] Patient Account Number: [id number] Date of Birth/Sex: Treating RN: February 18, 1961 (61 y.o. Tonita Phoenix,  Lauren Primary Care Danai Gotto: Durene Fruits Other Clinician: Referring Jashawna Reever: Treating Claudio Mondry/Extender: Wilber Oliphant, Amy Weeks in Treatment: 5 Active Inactive Wound/Skin Impairment Nursing Diagnoses: Impaired tissue integrity Knowledge deficit related to ulceration/compromised skin integrity Goals: Patient will have a decrease in wound volume by X% from date: (specify in notes) Date Initiated: 05/06/2021 Target Resolution Date: 06/18/2021 Goal Status: Active Patient/caregiver will verbalize understanding of skin care regimen Date Initiated: 05/06/2021 Target Resolution Date: 06/18/2021 Goal Status: Active Interventions: Assess patient/caregiver ability to obtain necessary supplies Assess patient/caregiver ability to perform ulcer/skin care regimen upon admission and as needed Assess ulceration(s) every visit Notes: Electronic Signature(s) Signed: 06/10/2021 10:27:40 AM By: Rhae Hammock RN Entered By: Rhae Hammock on 06/10/2021 08:54:20 -------------------------------------------------------------------------------- Pain Assessment Details Patient Name: Date of Service: Melissa Case 06/10/2021 8:00 A M Medical Record Number: [id number] Patient Account Number: [id number] Date of Birth/Sex: Treating RN: [birth year] (61 y.o. Tonita Phoenix, Lauren Primary Care Lance Huaracha: Durene Fruits Other Clinician: Referring Unity Luepke: Treating Anesia Blackwell/Extender: Wilber Oliphant, Amy Weeks in Treatment: 5 Active Problems Location of Pain Severity and Description of Pain Patient Has Paino No Site Locations Pain Management and Medication Current Pain Management: Electronic Signature(s) Signed: 06/10/2021 10:27:40 AM By: Rhae Hammock RN Entered By: Rhae Hammock on 06/10/2021 08:10:00 -------------------------------------------------------------------------------- Patient/Caregiver Education Details Patient Name: Date of Service: Melissa Case 1/20/2023andnbsp8:00 [place] Record Number: [id number] Patient Account Number: [id number] Date of Birth/Gender: Treating RN: 30-Jan-1961 (61 y.o. Tonita Phoenix, Lauren Primary Care Physician: Durene Fruits Other Clinician: Referring Physician: Treating Physician/Extender: Katy Fitch in Treatment: 5 Education Assessment Education Provided To: Patient Education Topics Provided Basic Hygiene: Methods: Explain/Verbal Responses: Reinforcements needed Electronic Signature(s) Signed: 06/10/2021 10:27:40 AM By: Rhae Hammock RN Entered By: Rhae Hammock on 06/10/2021 08:54:39 -------------------------------------------------------------------------------- Wound Assessment Details Patient Name: Date of Service: Melissa Case 06/10/2021 8:00 A M Medical Record Number: [id number] Patient Account Number: [id number] Date of Birth/Sex: Treating RN: Oct 28, 1960 (61 y.o. Tonita Phoenix, Lauren Primary Care Diallo Ponder: Durene Fruits Other Clinician: Referring Iyana Topor: Treating Jerrod Damiano/Extender: Wilber Oliphant, Amy Weeks in Treatment: 5 Wound Status Wound Number: 1 Primary Etiology: Pressure Ulcer Wound Location: Sacrum Wound Status: Open Wounding Event: Pressure Injury Comorbid History: Seizure Disorder Date Acquired: 04/21/2021 Weeks Of Treatment: 5 Clustered Wound: Yes Photos Wound Measurements Length: (cm) 11 Width: (cm) 15 Depth: (cm) 0.1  red, brown N/A N/A Exudate Color: Distinct, outline attached N/A N/A Wound Margin: Large (67-100%) N/A N/A Granulation A mount: Red, Pink N/A N/A Granulation Quality: None Present (0%) N/A N/A Necrotic A mount: Fat Layer (Subcutaneous Tissue): Yes N/A N/A Exposed Structures: Fascia: No Tendon: No Muscle: No Joint: No Bone: No Large (67-100%) N/A N/A Epithelialization: Treatment Notes Wound #1 (Sacrum) Cleanser Soap and Water Discharge Instruction: May shower and wash wound with dial antibacterial soap and water prior to dressing change. Peri-Wound Care Zinc Oxide Ointment 30g tube Discharge Instruction: or Laverta [place] Cream Topical Gentamicin Discharge Instruction: As directed by physician Primary Dressing Secondary Dressing ABD Pad, 5x9 Discharge Instruction: Apply over primary dressing as directed. Secured With Compression Wrap Compression Stockings Environmental education officer) Signed: 06/10/2021 9:31:46 AM By: Kalman Shan DO Signed: 06/10/2021 1:05:14 PM By: Baruch Gouty RN, BSN Entered By: Kalman Shan on 06/10/2021 09:27:43 -------------------------------------------------------------------------------- Multi-Disciplinary Care Plan Details Patient Name: Date of Service: Melissa Case 06/10/2021 8:00 A M Medical Record Number: [id number] Patient Account Number: [id number] Date of Birth/Sex: Treating RN: February 18, 1961 (61 y.o. Tonita Phoenix,  Lauren Primary Care Danai Gotto: Durene Fruits Other Clinician: Referring Jashawna Reever: Treating Claudio Mondry/Extender: Wilber Oliphant, Amy Weeks in Treatment: 5 Active Inactive Wound/Skin Impairment Nursing Diagnoses: Impaired tissue integrity Knowledge deficit related to ulceration/compromised skin integrity Goals: Patient will have a decrease in wound volume by X% from date: (specify in notes) Date Initiated: 05/06/2021 Target Resolution Date: 06/18/2021 Goal Status: Active Patient/caregiver will verbalize understanding of skin care regimen Date Initiated: 05/06/2021 Target Resolution Date: 06/18/2021 Goal Status: Active Interventions: Assess patient/caregiver ability to obtain necessary supplies Assess patient/caregiver ability to perform ulcer/skin care regimen upon admission and as needed Assess ulceration(s) every visit Notes: Electronic Signature(s) Signed: 06/10/2021 10:27:40 AM By: Rhae Hammock RN Entered By: Rhae Hammock on 06/10/2021 08:54:20 -------------------------------------------------------------------------------- Pain Assessment Details Patient Name: Date of Service: Melissa Case 06/10/2021 8:00 A M Medical Record Number: [id number] Patient Account Number: [id number] Date of Birth/Sex: Treating RN: [birth year] (61 y.o. Tonita Phoenix, Lauren Primary Care Lance Huaracha: Durene Fruits Other Clinician: Referring Unity Luepke: Treating Anesia Blackwell/Extender: Wilber Oliphant, Amy Weeks in Treatment: 5 Active Problems Location of Pain Severity and Description of Pain Patient Has Paino No Site Locations Pain Management and Medication Current Pain Management: Electronic Signature(s) Signed: 06/10/2021 10:27:40 AM By: Rhae Hammock RN Entered By: Rhae Hammock on 06/10/2021 08:10:00 -------------------------------------------------------------------------------- Patient/Caregiver Education Details Patient Name: Date of Service: Melissa Case 1/20/2023andnbsp8:00 [place] Record Number: [id number] Patient Account Number: [id number] Date of Birth/Gender: Treating RN: 30-Jan-1961 (61 y.o. Tonita Phoenix, Lauren Primary Care Physician: Durene Fruits Other Clinician: Referring Physician: Treating Physician/Extender: Katy Fitch in Treatment: 5 Education Assessment Education Provided To: Patient Education Topics Provided Basic Hygiene: Methods: Explain/Verbal Responses: Reinforcements needed Electronic Signature(s) Signed: 06/10/2021 10:27:40 AM By: Rhae Hammock RN Entered By: Rhae Hammock on 06/10/2021 08:54:39 -------------------------------------------------------------------------------- Wound Assessment Details Patient Name: Date of Service: Melissa Case 06/10/2021 8:00 A M Medical Record Number: [id number] Patient Account Number: [id number] Date of Birth/Sex: Treating RN: Oct 28, 1960 (61 y.o. Tonita Phoenix, Lauren Primary Care Diallo Ponder: Durene Fruits Other Clinician: Referring Iyana Topor: Treating Jerrod Damiano/Extender: Wilber Oliphant, Amy Weeks in Treatment: 5 Wound Status Wound Number: 1 Primary Etiology: Pressure Ulcer Wound Location: Sacrum Wound Status: Open Wounding Event: Pressure Injury Comorbid History: Seizure Disorder Date Acquired: 04/21/2021 Weeks Of Treatment: 5 Clustered Wound: Yes Photos Wound Measurements Length: (cm) 11 Width: (cm) 15 Depth: (cm) 0.1  Clustered Quantity: 4 Area: (cm) 129.591 Volume: (cm) 12.959 % Reduction in Area: -57.1% % Reduction in Volume: -57.1% Epithelialization: Large (67-100%) Tunneling: No Undermining: No Wound Description Classification: Category/Stage II Wound Margin: Distinct, outline attached Exudate Amount: Medium Exudate Type: Serosanguineous Exudate Color: red, brown Foul Odor After Cleansing: No Slough/Fibrino No Wound Bed Granulation Amount: Large (67-100%) Exposed Structure Granulation  Quality: Red, Pink Fascia Exposed: No Necrotic Amount: None Present (0%) Fat Layer (Subcutaneous Tissue) Exposed: Yes Tendon Exposed: No Muscle Exposed: No Joint Exposed: No Bone Exposed: No Treatment Notes Wound #1 (Sacrum) Cleanser Soap and Water Discharge Instruction: May shower and wash wound with dial antibacterial soap and water prior to dressing change. Peri-Wound Care Zinc Oxide Ointment 30g tube Discharge Instruction: or Laverta [place] Cream Topical Gentamicin Discharge Instruction: As directed by physician Primary Dressing Secondary Dressing ABD Pad, 5x9 Discharge Instruction: Apply over primary dressing as directed. Secured With Compression Wrap Compression Stockings Environmental education officer) Signed: 06/10/2021 10:27:40 AM By: Rhae Hammock RN Signed: 06/16/2021 3:23:49 PM By: Sandre Kitty Entered By: Sandre Kitty on 06/10/2021 08:20:53 -------------------------------------------------------------------------------- Vitals Details Patient Name: Date of Service: Melissa Case 06/10/2021 8:00 A M Medical Record Number: [id number] Patient Account Number: [id number] Date of Birth/Sex: Treating RN: 10-Mar-1961 (61 y.o. Tonita Phoenix, Lauren Primary Care Rayn Shorb: Durene Fruits Other Clinician: Referring Armetta Henri: Treating Devani Odonnel/Extender: Wilber Oliphant, Amy Weeks in Treatment: 5 Vital Signs Time Taken: 08:09 Temperature (F): 97.7 Height (in): 64 Pulse (bpm): 76 Weight (lbs): 167 Respiratory Rate (breaths/min): 17 Body Mass Index (BMI): 28.7 Blood Pressure (mmHg): 117/81 Reference Range: 80 - 120 mg / dl Electronic Signature(s) Signed: 06/10/2021 10:27:40 AM By: Rhae Hammock RN Entered By: Rhae Hammock on 06/10/2021 08:09:45  red, brown N/A N/A Exudate Color: Distinct, outline attached N/A N/A Wound Margin: Large (67-100%) N/A N/A Granulation A mount: Red, Pink N/A N/A Granulation Quality: None Present (0%) N/A N/A Necrotic A mount: Fat Layer (Subcutaneous Tissue): Yes N/A N/A Exposed Structures: Fascia: No Tendon: No Muscle: No Joint: No Bone: No Large (67-100%) N/A N/A Epithelialization: Treatment Notes Wound #1 (Sacrum) Cleanser Soap and Water Discharge Instruction: May shower and wash wound with dial antibacterial soap and water prior to dressing change. Peri-Wound Care Zinc Oxide Ointment 30g tube Discharge Instruction: or Laverta [place] Cream Topical Gentamicin Discharge Instruction: As directed by physician Primary Dressing Secondary Dressing ABD Pad, 5x9 Discharge Instruction: Apply over primary dressing as directed. Secured With Compression Wrap Compression Stockings Environmental education officer) Signed: 06/10/2021 9:31:46 AM By: Kalman Shan DO Signed: 06/10/2021 1:05:14 PM By: Baruch Gouty RN, BSN Entered By: Kalman Shan on 06/10/2021 09:27:43 -------------------------------------------------------------------------------- Multi-Disciplinary Care Plan Details Patient Name: Date of Service: Melissa Case 06/10/2021 8:00 A M Medical Record Number: [id number] Patient Account Number: [id number] Date of Birth/Sex: Treating RN: February 18, 1961 (61 y.o. Tonita Phoenix,  Lauren Primary Care Danai Gotto: Durene Fruits Other Clinician: Referring Jashawna Reever: Treating Claudio Mondry/Extender: Wilber Oliphant, Amy Weeks in Treatment: 5 Active Inactive Wound/Skin Impairment Nursing Diagnoses: Impaired tissue integrity Knowledge deficit related to ulceration/compromised skin integrity Goals: Patient will have a decrease in wound volume by X% from date: (specify in notes) Date Initiated: 05/06/2021 Target Resolution Date: 06/18/2021 Goal Status: Active Patient/caregiver will verbalize understanding of skin care regimen Date Initiated: 05/06/2021 Target Resolution Date: 06/18/2021 Goal Status: Active Interventions: Assess patient/caregiver ability to obtain necessary supplies Assess patient/caregiver ability to perform ulcer/skin care regimen upon admission and as needed Assess ulceration(s) every visit Notes: Electronic Signature(s) Signed: 06/10/2021 10:27:40 AM By: Rhae Hammock RN Entered By: Rhae Hammock on 06/10/2021 08:54:20 -------------------------------------------------------------------------------- Pain Assessment Details Patient Name: Date of Service: Melissa Case 06/10/2021 8:00 A M Medical Record Number: [id number] Patient Account Number: [id number] Date of Birth/Sex: Treating RN: [birth year] (61 y.o. Tonita Phoenix, Lauren Primary Care Lance Huaracha: Durene Fruits Other Clinician: Referring Unity Luepke: Treating Anesia Blackwell/Extender: Wilber Oliphant, Amy Weeks in Treatment: 5 Active Problems Location of Pain Severity and Description of Pain Patient Has Paino No Site Locations Pain Management and Medication Current Pain Management: Electronic Signature(s) Signed: 06/10/2021 10:27:40 AM By: Rhae Hammock RN Entered By: Rhae Hammock on 06/10/2021 08:10:00 -------------------------------------------------------------------------------- Patient/Caregiver Education Details Patient Name: Date of Service: Melissa Case 1/20/2023andnbsp8:00 [place] Record Number: [id number] Patient Account Number: [id number] Date of Birth/Gender: Treating RN: 30-Jan-1961 (61 y.o. Tonita Phoenix, Lauren Primary Care Physician: Durene Fruits Other Clinician: Referring Physician: Treating Physician/Extender: Katy Fitch in Treatment: 5 Education Assessment Education Provided To: Patient Education Topics Provided Basic Hygiene: Methods: Explain/Verbal Responses: Reinforcements needed Electronic Signature(s) Signed: 06/10/2021 10:27:40 AM By: Rhae Hammock RN Entered By: Rhae Hammock on 06/10/2021 08:54:39 -------------------------------------------------------------------------------- Wound Assessment Details Patient Name: Date of Service: Melissa Case 06/10/2021 8:00 A M Medical Record Number: [id number] Patient Account Number: [id number] Date of Birth/Sex: Treating RN: Oct 28, 1960 (61 y.o. Tonita Phoenix, Lauren Primary Care Diallo Ponder: Durene Fruits Other Clinician: Referring Iyana Topor: Treating Jerrod Damiano/Extender: Wilber Oliphant, Amy Weeks in Treatment: 5 Wound Status Wound Number: 1 Primary Etiology: Pressure Ulcer Wound Location: Sacrum Wound Status: Open Wounding Event: Pressure Injury Comorbid History: Seizure Disorder Date Acquired: 04/21/2021 Weeks Of Treatment: 5 Clustered Wound: Yes Photos Wound Measurements Length: (cm) 11 Width: (cm) 15 Depth: (cm) 0.1

## 2021-06-16 NOTE — Progress Notes (Signed)
Synopsis: Referred for PE by Camillia Herter, NP  Subjective:   PATIENT ID: Melissa Case GENDER: female DOB: 1961/02/10, MRN: [id number]  Chief Complaint  Patient presents with   Pulmonary Consult    Referred by Durene Fruits, NP. Pt dxed with PE and DVT 05/10/21. She is currently on Eliquis bid. She was started on o2 4lpm 24/7. She says her breathing is doing well as long as she uses her o2. She has some swelling in her legs and feet.    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  She had had several months of DOE, BLE swelling. Had not had fever, cough, CP. Has never had blood clot before. No one else in her family has had blood clots. No early pregnancy losses when she was younger. Was essentially bedridden for several months prior to discovery of clots when she was having difficulty with seizures. No bleeding issues on eliquis.   Went back to ED 06/13/21 for uncomfortable BLE swelling, given lasix and discharged home.  Otherwise pertinent review of systems is negative.  No family history of lung cancer or lung disease.  Smoked for 40 years 1ppd, quit 6 months ago. Still vapes nicotine products. She worked in a warehouse in the past.   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   Panic attack    Seizures ([place])      Family History  Problem Relation Age of Onset   Depression Sister    Heart disease Mother    Diabetes Father    Breast cancer Paternal Grandmother    Colon cancer Neg Hx    Pancreatic cancer Neg Hx    Esophageal cancer Neg Hx    Stomach cancer Neg Hx    Rectal cancer Neg  Hx    Liver cancer Neg Hx      Past Surgical History:  Procedure Laterality Date   ABDOMINAL HYSTERECTOMY     CHOLECYSTECTOMY N/A 11/21/2017   Procedure: LAPAROSCOPIC CHOLECYSTECTOMY WITH INTRAOPERATIVE CHOLANGIOGRAM;  Surgeon: Jovita Kussmaul, MD;  Location: [facility];  Service: General;  Laterality: N/A;   MULTIPLE EXTRACTIONS WITH ALVEOLOPLASTY N/A 08/28/2019   Procedure: Extraction of tooth #'s 3-7, 10-15, and 20-31 with alveoloplatsy, maxillary right buccal exostosis reduction, and bilateral mandibular tori reductions.;  Surgeon: Lenn Cal, DDS;  Location: [facility];  Service: Oral Surgery;  Laterality: N/A;   PAROTIDECTOMY Right 07/22/2019   with biopsy; done at [facility] by Dr. Fredricka Bonine    Social History   Socioeconomic History   Marital status: Married    Spouse name: Richard   Number of children: 4   Years of education: Not on file   Highest education level: Not on file  Occupational History   Not on file  Tobacco Use   Smoking status: Former    Packs/day: 0.50    Years: 42.00    Pack years: 21.00    Types: Cigarettes    Start date: 1978    Quit date: 01/16/2021    Years since quitting: 0.4   Smokeless tobacco: Never  Vaping Use   Vaping Use: Every  day   Start date: 01/16/2021   Substances: Nicotine  Substance and Sexual Activity   Alcohol use: No    Alcohol/week: 0.0 standard drinks   Drug use: No   Sexual activity: Yes    Birth control/protection: None  Other Topics Concern   Not on file  Social History Narrative   Lives with husband   Right Handed   Drinks 6-7 cups caffeine daily   Social Determinants of Health   Financial Resource Strain: Not on file  Food Insecurity: Not on file  Transportation Needs: Not on file  Physical Activity: Not on file  Stress: Not on file  Social Connections: Not on file  Intimate Partner Violence: Not on file     Allergies  Allergen Reactions   Morphine Itching   Penicillins Rash    Has patient had a PCN  reaction causing immediate rash, facial/tongue/throat swelling, SOB or lightheadedness with hypotension: No Has patient had a PCN reaction causing severe rash involving mucus membranes or skin necrosis: No Has patient had a PCN reaction that required hospitalization No Has patient had a PCN reaction occurring within the last 10 years: No If all of the above answers are "NO", then may proceed with Cephalosporin use.      Outpatient Medications Prior to Visit  Medication Sig Dispense Refill   apixaban (ELIQUIS) 5 MG TABS tablet Take 1 tablet (5 mg total) by mouth 2 (two) times daily. 60 tablet 0   dexamethasone (DECADRON) 4 MG tablet Take 1 tablet (4 mg total) by mouth every 12 (twelve) hours. 60 tablet 0   divalproex (DEPAKOTE) 250 MG DR tablet Take 1 tablet (250 mg total) by mouth 2 (two) times daily. (In addition to 500mg  twice daily for a total dosage of 750mg  BID). 60 tablet 3   esomeprazole (NEXIUM) 40 MG capsule Take 1 capsule by mouth once daily 30 capsule 0   furosemide (LASIX) 20 MG tablet Take 2 tablets (40 mg total) by mouth daily for 5 days. 10 tablet 0   levETIRAcetam (KEPPRA) 500 MG tablet Take 1 tablet (500 mg total) by mouth 2 (two) times daily. 60 tablet 3   Nystatin (GERHARDT'S BUTT CREAM) CREA Apply 1 application topically 3 (three) times daily.     PARoxetine (PAXIL) 40 MG tablet Take 1 tablet (40 mg total) by mouth daily. 90 tablet 1   apixaban (ELIQUIS) 5 MG TABS tablet Take 2 tablets (10 mg total) by mouth 2 (two) times daily for 4 days. 16 tablet 0   divalproex (DEPAKOTE) 500 MG DR tablet Take 1 tablet (500 mg total) by mouth 2 (two) times daily. 60 tablet 3   nicotine (NICODERM CQ - DOSED IN MG/24 HR) 7 mg/24hr patch Place 1 patch (7 mg total) onto the skin daily. 28 patch 0   No facility-administered medications prior to visit.       Objective:   Physical Exam:  General appearance: 61 y.o., female, NAD, conversant  Eyes: anicteric sclerae; PERRL, tracking  appropriately HENT: NCAT; MMM Neck: Trachea midline; no lymphadenopathy, no JVD Lungs: CTAB, no crackles, no wheeze, with normal respiratory effort CV: RRR, no murmur  Abdomen: Soft, non-tender; non-distended, BS present  Extremities: No peripheral edema, warm Skin: Normal turgor and texture; no rash Psych: Appropriate affect Neuro: Alert and oriented to person and place, no focal deficit     Vitals:   06/17/21 0905  Pulse: 78  SpO2: 100%   100% on 4L O2 BMI Readings from Last 3 Encounters:  05/20/21 30.02 kg/m  05/10/21 30.55 kg/m  05/02/21 30.55 kg/m   Wt Readings from Last 3 Encounters:  05/20/21 175 lb (79.4 kg)  05/02/21 178 lb (80.7 kg)  04/14/21 178 lb (80.7 kg)     CBC    Component Value Date/Time   WBC 4.8 06/13/2021 1213   RBC 3.17 (L) 06/13/2021 1213   HGB 10.5 (L) 06/13/2021 1221   HGB 10.8 (L) 05/20/2021 1030   HCT 31.0 (L) 06/13/2021 1221   HCT 33.2 (L) 05/20/2021 1030   PLT 149 (L) 06/13/2021 1213   PLT 203 05/20/2021 1030   MCV 104.7 (H) 06/13/2021 1213   MCV 99 (H) 05/20/2021 1030   MCH 33.4 06/13/2021 1213   MCHC 31.9 06/13/2021 1213   RDW 14.6 06/13/2021 1213   RDW 14.6 05/20/2021 1030   LYMPHSABS 0.5 (L) 06/13/2021 1213   LYMPHSABS 2.5 07/23/2017 0820   MONOABS 0.4 06/13/2021 1213   EOSABS 0.0 06/13/2021 1213   EOSABS 0.2 07/23/2017 0820   BASOSABS 0.0 06/13/2021 1213   BASOSABS 0.1 07/23/2017 0820    Chest Imaging:   CTA chest 05/10/21 reviewed by me with multifocal consolidation, bilateral proximal PE, no significant mediastinal LAD  Pulmonary Functions Testing Results: No flowsheet data found.    Echocardiogram:   TTE 05/10/21 indeterminate for DD< normal right heart     Assessment & Plan:   # Acute PE without right heart strain # chronic bilateral popliteal vein DVT Seems probably provoked in setting bedridden status while undergoing workup/mgmt of seizures.   # Multifocal consolidation on CTA Chest   # Chronic  hypoxemic respiratory failure: On 4L O2 per  with exertion and sleep, DME [employer]  Plan: - would favor extending anticoagulation for at least 6-12 months given her persistent relative immobility, lack of bleeding issues to date. Will revisit duration depending on degree of mobility at next visit - repeat CT Chest in 6 weeks given multifocal consolidation on CTA Chest which may reflect infarct vs infectious or noninfectious pneumonia (though symptoms not supportive of this) vs malignancy in this ex-smoker - RTC 6 weeks to discuss CT Chest. If still with significant DOE then PFTs, TTE - has upcoming appointment with vascular surgery for chronic DVT/venous insufficiency   I spent 48 minutes dedicated to the care of this patient on the date of this encounter to include pre-visit review of records, face-to-face time with the patient discussing conditions above, post visit ordering of testing, clinical documentation with the electronic health record, and communicating necessary findings to members of the patients care team.      Maryjane Hurter, MD [facility] 06/17/2021 9:08 AM

## 2021-06-17 ENCOUNTER — Other Ambulatory Visit: Payer: Self-pay

## 2021-06-17 ENCOUNTER — Ambulatory Visit (INDEPENDENT_AMBULATORY_CARE_PROVIDER_SITE_OTHER): Payer: BC Managed Care – PPO | Admitting: Student

## 2021-06-17 ENCOUNTER — Encounter: Payer: Self-pay | Admitting: Student

## 2021-06-17 VITALS — BP 140/84 | HR 78 | Temp 98.2°F | Ht 64.0 in

## 2021-06-17 DIAGNOSIS — I2699 Other pulmonary embolism without acute cor pulmonale: Secondary | ICD-10-CM | POA: Diagnosis not present

## 2021-06-17 DIAGNOSIS — H906 Mixed conductive and sensorineural hearing loss, bilateral: Secondary | ICD-10-CM | POA: Diagnosis not present

## 2021-06-17 DIAGNOSIS — R9389 Abnormal findings on diagnostic imaging of other specified body structures: Secondary | ICD-10-CM

## 2021-06-17 DIAGNOSIS — I82533 Chronic embolism and thrombosis of popliteal vein, bilateral: Secondary | ICD-10-CM

## 2021-06-17 DIAGNOSIS — J9611 Chronic respiratory failure with hypoxia: Secondary | ICD-10-CM | POA: Diagnosis not present

## 2021-06-17 NOTE — Patient Instructions (Signed)
-   CT Chest in 6 weeks and then appointment with me to discuss results

## 2021-06-20 ENCOUNTER — Encounter (HOSPITAL_COMMUNITY): Payer: Self-pay

## 2021-06-20 ENCOUNTER — Emergency Department (HOSPITAL_COMMUNITY)
Admission: EM | Admit: 2021-06-20 | Discharge: 2021-06-20 | Disposition: A | Discharge status: Home / Self Care | Payer: BC Managed Care – PPO | Attending: Emergency Medicine | Admitting: Emergency Medicine

## 2021-06-20 ENCOUNTER — Other Ambulatory Visit: Payer: Self-pay

## 2021-06-20 DIAGNOSIS — M25562 Pain in left knee: Secondary | ICD-10-CM | POA: Insufficient documentation

## 2021-06-20 DIAGNOSIS — M25561 Pain in right knee: Secondary | ICD-10-CM | POA: Diagnosis not present

## 2021-06-20 DIAGNOSIS — M25569 Pain in unspecified knee: Secondary | ICD-10-CM | POA: Diagnosis not present

## 2021-06-20 DIAGNOSIS — R600 Localized edema: Secondary | ICD-10-CM | POA: Diagnosis not present

## 2021-06-20 DIAGNOSIS — I2699 Other pulmonary embolism without acute cor pulmonale: Secondary | ICD-10-CM | POA: Diagnosis not present

## 2021-06-20 DIAGNOSIS — G8929 Other chronic pain: Secondary | ICD-10-CM | POA: Insufficient documentation

## 2021-06-20 DIAGNOSIS — E785 Hyperlipidemia, unspecified: Secondary | ICD-10-CM | POA: Diagnosis not present

## 2021-06-20 DIAGNOSIS — F32A Depression, unspecified: Secondary | ICD-10-CM | POA: Diagnosis not present

## 2021-06-20 DIAGNOSIS — L309 Dermatitis, unspecified: Secondary | ICD-10-CM | POA: Insufficient documentation

## 2021-06-20 DIAGNOSIS — J9601 Acute respiratory failure with hypoxia: Secondary | ICD-10-CM | POA: Diagnosis not present

## 2021-06-20 DIAGNOSIS — I2510 Atherosclerotic heart disease of native coronary artery without angina pectoris: Secondary | ICD-10-CM | POA: Diagnosis not present

## 2021-06-20 DIAGNOSIS — K8020 Calculus of gallbladder without cholecystitis without obstruction: Secondary | ICD-10-CM | POA: Diagnosis not present

## 2021-06-20 DIAGNOSIS — K219 Gastro-esophageal reflux disease without esophagitis: Secondary | ICD-10-CM | POA: Diagnosis not present

## 2021-06-20 DIAGNOSIS — R7303 Prediabetes: Secondary | ICD-10-CM | POA: Diagnosis not present

## 2021-06-20 DIAGNOSIS — G40909 Epilepsy, unspecified, not intractable, without status epilepticus: Secondary | ICD-10-CM | POA: Diagnosis not present

## 2021-06-20 DIAGNOSIS — K2270 Barrett's esophagus without dysplasia: Secondary | ICD-10-CM | POA: Diagnosis not present

## 2021-06-20 DIAGNOSIS — I82433 Acute embolism and thrombosis of popliteal vein, bilateral: Secondary | ICD-10-CM | POA: Diagnosis not present

## 2021-06-20 DIAGNOSIS — G510 Bell's palsy: Secondary | ICD-10-CM | POA: Diagnosis not present

## 2021-06-20 DIAGNOSIS — F410 Panic disorder [episodic paroxysmal anxiety] without agoraphobia: Secondary | ICD-10-CM | POA: Diagnosis not present

## 2021-06-20 DIAGNOSIS — I119 Hypertensive heart disease without heart failure: Secondary | ICD-10-CM | POA: Diagnosis not present

## 2021-06-20 DIAGNOSIS — D649 Anemia, unspecified: Secondary | ICD-10-CM | POA: Diagnosis not present

## 2021-06-20 DIAGNOSIS — I749 Embolism and thrombosis of unspecified artery: Secondary | ICD-10-CM | POA: Diagnosis not present

## 2021-06-20 DIAGNOSIS — K645 Perianal venous thrombosis: Secondary | ICD-10-CM | POA: Diagnosis not present

## 2021-06-20 MED ORDER — FUROSEMIDE 40 MG PO TABS
40.0000 mg | ORAL_TABLET | Freq: Every day | ORAL | 0 refills | Status: DC
Start: 2021-06-20 — End: 2021-07-27

## 2021-06-20 MED ORDER — OXYCODONE HCL 5 MG PO TABS: 5.0000 mg | ORAL_TABLET | Freq: Four times a day (QID) | ORAL | 0 refills | Status: DC | PRN

## 2021-06-20 MED ORDER — DOCUSATE SODIUM 100 MG PO CAPS: 100.0000 mg | ORAL_CAPSULE | Freq: Every day | ORAL | 0 refills | Status: DC | PRN

## 2021-06-20 MED ORDER — OXYCODONE-ACETAMINOPHEN 5-325 MG PO TABS
1.0000 | ORAL_TABLET | Freq: Once | ORAL | Status: AC
  Administered 2021-06-20: 1 via ORAL
  Filled 2021-06-20: qty 1

## 2021-06-20 NOTE — Discharge Instructions (Addendum)
I prescribed a stronger pain medicine called oxycodone to take if your knee pain gets more severe.  You can use this once every 6-8 hours as needed.  I also prescribed a stool softener to take with this to prevent constipation.  I prescribed an additional 2 weeks of the diuretic called Lasix, to see if we can help relieve some fluid from your legs.  You can also continue to use the leg compression stockings at home whenever possible, and keep your legs elevated to waist level.  I do not see signs of infection at this time.  Please follow up with your primary care clinic if you continue having issues with swelling and pain in your legs.

## 2021-06-20 NOTE — ED Provider Notes (Signed)
[facility] DEPT Provider Note   CSN: [id number] Arrival date & time: 06/20/21  5277     History  CC: Knee pain, leg swelling   Melissa Case is a 61 y.o. female with a history of bilateral chronic lower extremity DVT and PE, on Eliquis, history of lower extremity edema, presenting to ED with complaint of knee pain.  The patient has had this chronic complaint for a long time.  She reports that her home health care nurse came by and saw her today or yesterday, and was concerned that her legs felt, warm and looked red".  She was advised to come to the ED for an evaluation.  She does not report otherwise any significant change in her chronic pain in her legs, for which she takes Tylenol.  She is able to ambulate and gets home PT, uses a walker at home.  Her legs hurt all the time regardless of whether she is sitting or standing.  She was seen in the ED approximately a week ago per my review of her medical records, prescribed a short course of Lasix at that time, which she has been taking at home.  She has been urinating frequently.  Does not feel that the swelling is better.  Her husband provided supplemental history by phone, reports to me that they will also try compression stockings, it has not helped with the leg swelling.  HPI     Home Medications Prior to Admission medications   Medication Sig Start Date End Date Taking? Authorizing Provider  docusate sodium (COLACE) 100 MG capsule Take 1 capsule (100 mg total) by mouth daily as needed for up to 30 doses for mild constipation. To prevent constipation while taking oxycodone 06/20/21  Yes Durward Matranga, Carola Rhine, MD  furosemide (LASIX) 40 MG tablet Take 1 tablet (40 mg total) by mouth daily for 14 doses. 06/20/21 07/04/21 Yes Reyna Lorenzi, Carola Rhine, MD  oxyCODONE (ROXICODONE) 5 MG immediate release tablet Take 1 tablet (5 mg total) by mouth every 6 (six) hours as needed for up to 15 doses for severe pain. 06/20/21  Yes  Tanisia Yokley, Carola Rhine, MD  apixaban (ELIQUIS) 5 MG TABS tablet Take 2 tablets (10 mg total) by mouth 2 (two) times daily for 4 days. 05/14/21 05/18/21  Gifford Shave, MD  apixaban (ELIQUIS) 5 MG TABS tablet Take 1 tablet (5 mg total) by mouth 2 (two) times daily. 06/16/21   Camillia Herter, NP  dexamethasone (DECADRON) 4 MG tablet Take 1 tablet (4 mg total) by mouth every 12 (twelve) hours. 05/14/21   Gifford Shave, MD  divalproex (DEPAKOTE) 250 MG DR tablet Take 1 tablet (250 mg total) by mouth 2 (two) times daily. (In addition to 500mg  twice daily for a total dosage of 750mg  BID). 04/19/21   Alric Ran, MD  divalproex (DEPAKOTE) 500 MG DR tablet Take 1 tablet (500 mg total) by mouth 2 (two) times daily. 03/16/21 05/10/21  Alric Ran, MD  esomeprazole (NEXIUM) 40 MG capsule Take 1 capsule by mouth once daily 05/03/21   Thornton Park, MD  furosemide (LASIX) 20 MG tablet Take 2 tablets (40 mg total) by mouth daily for 5 days. 06/13/21 06/18/21  Deno Etienne, DO  levETIRAcetam (KEPPRA) 500 MG tablet Take 1 tablet (500 mg total) by mouth 2 (two) times daily. 03/28/21 06/17/21  Alric Ran, MD  Nystatin (GERHARDT'S BUTT CREAM) CREA Apply 1 application topically 3 (three) times daily. 05/14/21   Gifford Shave, MD  PARoxetine (PAXIL) 40 MG  tablet Take 1 tablet (40 mg total) by mouth daily. 04/01/21   Arfeen, Arlyce Harman, MD      Allergies    Morphine and Penicillins    Review of Systems   Review of Systems  Physical Exam Updated Vital Signs BP (!) 145/64    Pulse 82    Temp 97.8 F (36.6 C) (Oral)    Resp 16    Ht 5\' 4"  (1.626 m)    Wt 79.4 kg    SpO2 98%    BMI 30.05 kg/m  Physical Exam Constitutional:      General: She is not in acute distress. HENT:     Head: Normocephalic and atraumatic.  Eyes:     Conjunctiva/sclera: Conjunctivae normal.     Pupils: Pupils are equal, round, and reactive to light.  Cardiovascular:     Rate and Rhythm: Normal rate and regular rhythm.   Pulmonary:     Effort: Pulmonary effort is normal. No respiratory distress.  Abdominal:     General: There is no distension.     Tenderness: There is no abdominal tenderness.  Musculoskeletal:     Comments: Bilateral lower extremity pitting edema with some venous stasis dermatitis of the lower legs,, mild No isolated tenderness of the patella or fibular head, no notable joint effusion, patient does have active and passive range of motion at the knees with minimal tenderness, no significant overlying erythema.  Skin:    General: Skin is warm and dry.  Neurological:     General: No focal deficit present.     Mental Status: She is alert. Mental status is at baseline.    ED Results / Procedures / Treatments   Labs (all labs ordered are listed, but only abnormal results are displayed) Labs Reviewed - No data to display  EKG None  Radiology No results found.  Procedures Procedures    Medications Ordered in ED Medications  oxyCODONE-acetaminophen (PERCOCET/ROXICET) 5-325 MG per tablet 1 tablet (1 tablet Oral Given 06/20/21 1019)    ED Course/ Medical Decision Making/ A&P                           Medical Decision Making Risk OTC drugs. Prescription drug management.   Patient is here with complaint of chronic bilateral knee pain.  I have very low suspicion overall for acute DVT, septic joint, or fracture, or compartment syndrome of the lower extremities.  She has some mild venous stasis dermatitis of the lower legs, but I doubt this is cellulitis.  She is taking Tylenol for pain at home.  We can give her a short course of a stronger pain medication with Percocet here.  I discussed the case with her husband by phone, provided supplemental history.  Do not believe the patient needs emergent blood test at this time, I think it be reasonable to continue her home Lasix at home for several days  External records reviewed - pulm office note 3 days ago, stable from PE perspective; no  new respiratory complaints of oxygen requirements in the ED here.  Husband coming to take patient home.        Final Clinical Impression(s) / ED Diagnoses Final diagnoses:  Chronic pain of both knees    Rx / DC Orders ED Discharge Orders          Ordered    oxyCODONE (ROXICODONE) 5 MG immediate release tablet  Every 6 hours PRN  06/20/21 1036    docusate sodium (COLACE) 100 MG capsule  Daily PRN        06/20/21 1036    furosemide (LASIX) 40 MG tablet  Daily        06/20/21 1036              Wyvonnia Dusky, MD 06/20/21 1335

## 2021-06-20 NOTE — ED Triage Notes (Signed)
Pt BIB [facility] from home. Patient c/o bilateral knee pain since Christmas eve. Patient stated she woke up and the pain was 10/10. Patient denies any trauma. Hx of blood clots. Patient has been on lasix and blood thinners.   Vital signs were:  138/76 84-HR 98% RA

## 2021-06-23 DIAGNOSIS — G510 Bell's palsy: Secondary | ICD-10-CM | POA: Diagnosis not present

## 2021-06-23 DIAGNOSIS — K645 Perianal venous thrombosis: Secondary | ICD-10-CM | POA: Diagnosis not present

## 2021-06-23 DIAGNOSIS — F32A Depression, unspecified: Secondary | ICD-10-CM | POA: Diagnosis not present

## 2021-06-23 DIAGNOSIS — G40909 Epilepsy, unspecified, not intractable, without status epilepticus: Secondary | ICD-10-CM | POA: Diagnosis not present

## 2021-06-23 DIAGNOSIS — R7303 Prediabetes: Secondary | ICD-10-CM | POA: Diagnosis not present

## 2021-06-23 DIAGNOSIS — K2270 Barrett's esophagus without dysplasia: Secondary | ICD-10-CM | POA: Diagnosis not present

## 2021-06-23 DIAGNOSIS — F410 Panic disorder [episodic paroxysmal anxiety] without agoraphobia: Secondary | ICD-10-CM | POA: Diagnosis not present

## 2021-06-23 DIAGNOSIS — I2699 Other pulmonary embolism without acute cor pulmonale: Secondary | ICD-10-CM | POA: Diagnosis not present

## 2021-06-23 DIAGNOSIS — E785 Hyperlipidemia, unspecified: Secondary | ICD-10-CM | POA: Diagnosis not present

## 2021-06-23 DIAGNOSIS — J9601 Acute respiratory failure with hypoxia: Secondary | ICD-10-CM | POA: Diagnosis not present

## 2021-06-23 DIAGNOSIS — K219 Gastro-esophageal reflux disease without esophagitis: Secondary | ICD-10-CM | POA: Diagnosis not present

## 2021-06-23 DIAGNOSIS — I82433 Acute embolism and thrombosis of popliteal vein, bilateral: Secondary | ICD-10-CM | POA: Diagnosis not present

## 2021-06-23 DIAGNOSIS — I119 Hypertensive heart disease without heart failure: Secondary | ICD-10-CM | POA: Diagnosis not present

## 2021-06-23 DIAGNOSIS — D649 Anemia, unspecified: Secondary | ICD-10-CM | POA: Diagnosis not present

## 2021-06-23 DIAGNOSIS — K8020 Calculus of gallbladder without cholecystitis without obstruction: Secondary | ICD-10-CM | POA: Diagnosis not present

## 2021-06-23 DIAGNOSIS — I2510 Atherosclerotic heart disease of native coronary artery without angina pectoris: Secondary | ICD-10-CM | POA: Diagnosis not present

## 2021-06-24 ENCOUNTER — Other Ambulatory Visit: Payer: Self-pay

## 2021-06-24 ENCOUNTER — Encounter (HOSPITAL_BASED_OUTPATIENT_CLINIC_OR_DEPARTMENT_OTHER): Payer: BC Managed Care – PPO | Attending: Internal Medicine | Admitting: Internal Medicine

## 2021-06-24 DIAGNOSIS — L89152 Pressure ulcer of sacral region, stage 2: Secondary | ICD-10-CM | POA: Insufficient documentation

## 2021-06-24 DIAGNOSIS — Z7901 Long term (current) use of anticoagulants: Secondary | ICD-10-CM | POA: Insufficient documentation

## 2021-06-24 DIAGNOSIS — L89212 Pressure ulcer of right hip, stage 2: Secondary | ICD-10-CM | POA: Diagnosis not present

## 2021-06-24 DIAGNOSIS — F17200 Nicotine dependence, unspecified, uncomplicated: Secondary | ICD-10-CM | POA: Insufficient documentation

## 2021-06-24 DIAGNOSIS — G40909 Epilepsy, unspecified, not intractable, without status epilepticus: Secondary | ICD-10-CM | POA: Insufficient documentation

## 2021-06-24 DIAGNOSIS — C07 Malignant neoplasm of parotid gland: Secondary | ICD-10-CM | POA: Insufficient documentation

## 2021-06-24 DIAGNOSIS — Z86718 Personal history of other venous thrombosis and embolism: Secondary | ICD-10-CM | POA: Diagnosis not present

## 2021-06-26 DIAGNOSIS — R7303 Prediabetes: Secondary | ICD-10-CM | POA: Diagnosis not present

## 2021-06-26 DIAGNOSIS — K219 Gastro-esophageal reflux disease without esophagitis: Secondary | ICD-10-CM | POA: Diagnosis not present

## 2021-06-26 DIAGNOSIS — I82433 Acute embolism and thrombosis of popliteal vein, bilateral: Secondary | ICD-10-CM | POA: Diagnosis not present

## 2021-06-26 DIAGNOSIS — G510 Bell's palsy: Secondary | ICD-10-CM | POA: Diagnosis not present

## 2021-06-26 DIAGNOSIS — K645 Perianal venous thrombosis: Secondary | ICD-10-CM | POA: Diagnosis not present

## 2021-06-26 DIAGNOSIS — I2699 Other pulmonary embolism without acute cor pulmonale: Secondary | ICD-10-CM | POA: Diagnosis not present

## 2021-06-26 DIAGNOSIS — E785 Hyperlipidemia, unspecified: Secondary | ICD-10-CM | POA: Diagnosis not present

## 2021-06-26 DIAGNOSIS — D649 Anemia, unspecified: Secondary | ICD-10-CM | POA: Diagnosis not present

## 2021-06-26 DIAGNOSIS — K2270 Barrett's esophagus without dysplasia: Secondary | ICD-10-CM | POA: Diagnosis not present

## 2021-06-26 DIAGNOSIS — F32A Depression, unspecified: Secondary | ICD-10-CM | POA: Diagnosis not present

## 2021-06-26 DIAGNOSIS — I119 Hypertensive heart disease without heart failure: Secondary | ICD-10-CM | POA: Diagnosis not present

## 2021-06-26 DIAGNOSIS — F410 Panic disorder [episodic paroxysmal anxiety] without agoraphobia: Secondary | ICD-10-CM | POA: Diagnosis not present

## 2021-06-26 DIAGNOSIS — J9601 Acute respiratory failure with hypoxia: Secondary | ICD-10-CM | POA: Diagnosis not present

## 2021-06-26 DIAGNOSIS — G40909 Epilepsy, unspecified, not intractable, without status epilepticus: Secondary | ICD-10-CM | POA: Diagnosis not present

## 2021-06-26 DIAGNOSIS — K8020 Calculus of gallbladder without cholecystitis without obstruction: Secondary | ICD-10-CM | POA: Diagnosis not present

## 2021-06-26 DIAGNOSIS — I2510 Atherosclerotic heart disease of native coronary artery without angina pectoris: Secondary | ICD-10-CM | POA: Diagnosis not present

## 2021-06-27 NOTE — Progress Notes (Signed)
Melissa Case ([id number]) , Visit Report for 06/24/2021 Chief Complaint Document Details Patient Name: Date of Service: Melissa Case 06/24/2021 8:30 A M Medical Record Number: [id number] Patient Account Number: [id number] Date of Birth/Sex: Treating RN: [birth year] (61 y.o. Elam Dutch Primary Care Provider: Durene Fruits Other Clinician: Referring Provider: Treating Provider/Extender: Wilber Oliphant, Amy Weeks in Treatment: 7 Information Obtained from: Patient Chief Complaint Multiple areas of skin breakdown to the sacrum and right ischium Electronic Signature(s) Signed: 06/24/2021 9:35:30 AM By: Kalman Shan DO Entered By: Kalman Shan on 06/24/2021 09:30:39 -------------------------------------------------------------------------------- HPI Details Patient Name: Date of Service: Melissa Case 06/24/2021 8:30 A M Medical Record Number: [id number] Patient Account Number: [id number] Date of Birth/Sex: Treating RN: Jul 04, 1960 (61 y.o. 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 She states that she has had debilitating seizures that have caused her to be less mobile. She uses a walker for ambulation. She states she mostly sits all day. She has been using mupirocin cream that was prescribed by her primary care provider. She reports mild tenderness to the areas. She denies signs of infection. 12/30; patient presents for follow-up. She was recently hospitalized from 12/20 just 12/24 for PEs and lower extremity DVT  She is currently on Eliquis. She s. has been using zinc oxide to the wound beds. She reports improvement in wound healing. She currently denies signs of infection. 1/20; patient presents for follow-up. She has been using zinc oxide to the wound beds with improvement in wound healing. She currently denies signs of infection. 2/3; patient presents for follow-up. She has been using gentamicin to the wound beds and zinc oxide to the surrounding skin. She reports improvement in wound healing. She reports 1 open area. Electronic Signature(s) Signed: 06/24/2021 9:35:30 AM By: Kalman Shan DO Entered By: Kalman Shan on 06/24/2021 09:31:17 -------------------------------------------------------------------------------- Physical Exam Details Patient Name: Date of Service: Melissa Case 06/24/2021 8:30 A M Medical Record Number: [id number] Patient Account Number: [id number] Date of Birth/Sex: Treating RN: 1960-11-18 (61 y.o. Elam Dutch Primary Care Provider: Durene Fruits Other Clinician: Referring Provider: Treating Provider/Extender: Wilber Oliphant, Amy Weeks in Treatment: 7 Constitutional respirations regular, non-labored and within target range for patient.Marland Kitchen Psychiatric pleasant and cooperative. Notes T the left buttocks there is 1 small open wound present. The previous scattered wounds appear well-healed. There are no signs of infection. o Electronic Signature(s) Signed: 06/24/2021 9:35:30 AM By: Kalman Shan DO Entered By: Kalman Shan on 06/24/2021 09:32:14 -------------------------------------------------------------------------------- Physician Orders Details Patient Name: Date of Service: Melissa Case 06/24/2021 8:30 A M Medical Record Number: [id number] Patient Account Number: [id number] Date of Birth/Sex: Treating RN: 03-13-1961 (61 y.o. [name] Primary Care Provider: Durene Fruits Other Clinician: Referring Provider: Treating  Provider/Extender: Wilber Oliphant, Amy Weeks in Treatment: 7 Verbal / Phone Orders: No Diagnosis Coding ICD-10 Coding Code Description L89.152 Pressure ulcer of sacral region, stage 2 L89.212 Pressure ulcer of right hip, stage 2 R56.9 Unspecified convulsions C07 Malignant neoplasm of parotid gland Follow-up Appointments ppointment in 2 weeks. - Dr. Heber [place] Return A Bathing/ Shower/  Hygiene May shower with protection but do not get wound dressing(s) wet. Off-Loading Turn and reposition every 2 hours Wound Treatment Wound #1 - Sacrum Cleanser: Soap and Water 1 x Per Day/15 Days Discharge Instructions: May shower and wash wound with dial antibacterial soap and water prior to dressing change. Peri-Wound Care: Zinc Oxide Ointment 30g tube 1 x Per Day/15 Days Discharge Instructions: or Laverta Baltimore Cream Topical: Gentamicin 1 x Per Day/15 Days Discharge Instructions: As directed by physician Secondary Dressing: ABD Pad, 5x9 (Generic) 1 x Per Day/15 Days Discharge Instructions: Apply over primary dressing as directed. Patient Medications llergies: morphine, Penicillins A Notifications Medication Indication Start End 06/24/2021 gentamicin DOSE 1 - topical 0.1 % cream - 1 application daily to the wound bed Electronic Signature(s) Signed: 06/24/2021 9:35:30 AM By: Kalman Shan DO Previous Signature: 06/24/2021 9:33:08 AM Version By: Kalman Shan DO Entered By: Kalman Shan on 06/24/2021 09:33:26 -------------------------------------------------------------------------------- Problem List Details Patient Name: Date of Service: Melissa Case 06/24/2021 8:30 A M Medical Record Number: [id number] Patient Account Number: [id number] Date of Birth/Sex: Treating RN: Jan 17, 1961 (61 y.o. [name] Primary Care Provider: Durene Fruits Other Clinician: Referring Provider: Treating Provider/Extender: Wilber Oliphant, Amy Weeks in Treatment: 7 Active  Problems ICD-10 Encounter Code Description Active Date MDM Diagnosis L89.152 Pressure ulcer of sacral region, stage 2 05/06/2021 No Yes L89.212 Pressure ulcer of right hip, stage 2 05/06/2021 No Yes R56.9 Unspecified convulsions 05/06/2021 No Yes C07 Malignant neoplasm of parotid gland 05/06/2021 No Yes Inactive Problems Resolved Problems Electronic Signature(s) Signed: 06/24/2021 9:35:30 AM By: Kalman Shan DO Entered By: Kalman Shan on 06/24/2021 09:30:22 -------------------------------------------------------------------------------- Progress Note Details Patient Name: Date of Service: Melissa Case 06/24/2021 8:30 A M Medical Record Number: [id number] Patient Account Number: [id number] Date of Birth/Sex: Treating RN: 01-Feb-1961 (61 y.o. 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 She states that she has had debilitating seizures that have caused her to be less mobile. She uses a walker for ambulation. She states she mostly sits all day. She has been using mupirocin cream that was prescribed by her primary care provider. She reports mild tenderness to the areas. She denies signs of infection. 12/30; patient presents for follow-up. She was recently hospitalized from 12/20 just 12/24 for PEs and lower extremity DVT She is currently on Eliquis. She s. has been using zinc oxide to the wound beds. She reports  improvement in wound healing. She currently denies signs of infection. 1/20; patient presents for follow-up. She has been using zinc oxide to the wound beds with improvement in wound healing. She currently denies signs of infection. 2/3; patient presents for follow-up. She has been using gentamicin to the wound beds and zinc oxide to the surrounding skin. She reports improvement in wound healing. She reports 1 open area. Patient History Information obtained from Patient, Chart. Family History Unknown History. Social History Current every day smoker - VAPORIZER, Marital Status - Married, Alcohol Use - Never, Drug Use - No History, Caffeine Use -  Never. Medical History Eyes Denies history of Cataracts, Glaucoma, Optic Neuritis Ear/Nose/Mouth/Throat Denies history of Chronic sinus problems/congestion, Middle ear problems Hematologic/Lymphatic Denies history of Anemia, Hemophilia, Human Immunodeficiency Virus, Lymphedema, Sickle Cell Disease Respiratory Denies history of Aspiration, Asthma, Chronic Obstructive Pulmonary Disease (COPD), Pneumothorax, Sleep Apnea, Tuberculosis Cardiovascular Denies history of Angina, Arrhythmia, Congestive Heart Failure, Coronary Artery Disease, Deep Vein Thrombosis, Hypertension, Hypotension, Myocardial Infarction, Peripheral Arterial Disease, Peripheral Venous Disease, Phlebitis, Vasculitis Gastrointestinal Denies history of Cirrhosis , Colitis, Crohnoos, Hepatitis A, Hepatitis B, Hepatitis C Genitourinary Denies history of End Stage Renal Disease Immunological Denies history of Lupus Erythematosus, Raynaudoos, Scleroderma Integumentary (Skin) Denies history of History of Burn Musculoskeletal Denies history of Gout, Rheumatoid Arthritis, Osteoarthritis, Osteomyelitis Neurologic Patient has history of Seizure Disorder Denies history of Dementia, Neuropathy, Quadriplegia, Paraplegia Oncologic Denies history of Received Chemotherapy, Received  Radiation Psychiatric Denies history of Anorexia/bulimia, Confinement Anxiety Hospitalization/Surgery History - ABDOMINAL HYSTERECTOMY. - CHOLECYSTECTOMY. - MULTIPLE ETRACTIONS WITH ALVEOPLASTY. - PARATIDECTOMY. Medical A Surgical History Notes nd Gastrointestinal GERD Objective Constitutional respirations regular, non-labored and within target range for patient.. Vitals Time Taken: 8:04 AM, Height: 64 in, Weight: 167 lbs, BMI: 28.7, Temperature: 97.6 F, Pulse: 70 bpm, Respiratory Rate: 18 breaths/min, Blood Pressure: 148/85 mmHg. Psychiatric pleasant and cooperative. General Notes: T the left buttocks there is 1 small open wound present. The previous scattered wounds appear well-healed. There are no signs of infection. o Integumentary (Hair, Skin) Wound #1 status is Open. Original cause of wound was Pressure Injury. The date acquired was: 04/21/2021. The wound has been in treatment 7 weeks. The wound is located on the Sacrum. The wound measures 0.5cm length x 0.8cm width x 0.1cm depth; 0.314cm^2 area and 0.031cm^3 volume. There is Fat Layer (Subcutaneous Tissue) exposed. There is no tunneling or undermining noted. There is a medium amount of serosanguineous drainage noted. The wound margin is distinct with the outline attached to the wound base. There is large (67-100%) red, pink granulation within the wound bed. There is no necrotic tissue within the wound bed. Assessment Active Problems ICD-10 Pressure ulcer of sacral region, stage 2 Pressure ulcer of right hip, stage 2 Unspecified convulsions Malignant neoplasm of parotid gland Patient has done well with gentamicin and zinc oxide. I recommended continuing this. She has 1 wound remaining. Follow-up in 2 weeks and I am hopeful this will be healed by then. I recommended continuing to aggressively offload the area. Plan Follow-up Appointments: Return Appointment in 2 weeks. - Dr. Heber [place] Bathing/ Shower/ Hygiene: May shower with  protection but do not get wound dressing(s) wet. Off-Loading: Turn and reposition every 2 hours The following medication(s) was prescribed: gentamicin topical 0.1 % cream 1 1 application daily to the wound bed starting 06/24/2021 WOUND #1: - Sacrum Wound Laterality: Cleanser: Soap and Water 1 x Per Day/15 Days Discharge Instructions: May shower and wash wound with dial antibacterial soap and water prior to dressing change. Peri-Wound Care: Zinc Oxide Ointment 30g tube 1 x Per Day/15 Days Discharge Instructions: or Laverta Baltimore Cream Topical: Gentamicin 1 x Per Day/15 Days Discharge Instructions: As directed by physician Secondary Dressing: ABD Pad, 5x9 (Generic) 1 x Per Day/15 Days Discharge Instructions: Apply over primary dressing as directed. 1. Gentamicin and zinc oxide 2. Aggressive offloading 3. Follow-up in 2 weeks Electronic Signature(s) Signed: 06/24/2021 9:35:30 AM By: Kalman Shan DO Entered By: Kalman Shan on 06/24/2021 09:34:55 -------------------------------------------------------------------------------- HxROS Details Patient Name: Date of Service: Melissa Case 06/24/2021 8:30 A M Medical Record Number:  [id number] Patient Account Number: [id number] Date of Birth/Sex: Treating RN: 01/05/1961 (61 y.o. Elam Dutch Primary Care Provider: Durene Fruits Other Clinician: Referring Provider: Treating Provider/Extender: Wilber Oliphant, Amy Weeks in Treatment: 7 Information Obtained From Patient Chart Eyes Medical History: Negative for: Cataracts; Glaucoma; Optic Neuritis Ear/Nose/Mouth/Throat Medical History: Negative for: Chronic sinus problems/congestion; Middle ear problems Hematologic/Lymphatic Medical History: Negative for: Anemia; Hemophilia; Human Immunodeficiency Virus; Lymphedema; Sickle Cell Disease Respiratory Medical History: Negative for: Aspiration; Asthma; Chronic Obstructive Pulmonary Disease (COPD); Pneumothorax; Sleep  Apnea; Tuberculosis Cardiovascular Medical History: Negative for: Angina; Arrhythmia; Congestive Heart Failure; Coronary Artery Disease; Deep Vein Thrombosis; Hypertension; Hypotension; Myocardial Infarction; Peripheral Arterial Disease; Peripheral Venous Disease; Phlebitis; Vasculitis Gastrointestinal Medical History: Negative for: Cirrhosis ; Colitis; Crohns; Hepatitis A; Hepatitis B; Hepatitis C Past Medical History Notes: GERD Genitourinary Medical History: Negative for: End Stage Renal Disease Immunological Medical History: Negative for: Lupus Erythematosus; Raynauds; Scleroderma Integumentary (Skin) Medical History: Negative for: History of Burn Musculoskeletal Medical History: Negative for: Gout; Rheumatoid Arthritis; Osteoarthritis; Osteomyelitis Neurologic Medical History: Positive for: Seizure Disorder Negative for: Dementia; Neuropathy; Quadriplegia; Paraplegia Oncologic Medical History: Negative for: Received Chemotherapy; Received Radiation Psychiatric Medical History: Negative for: Anorexia/bulimia; Confinement Anxiety Immunizations Implantable Devices None Hospitalization / Surgery History Type of Hospitalization/Surgery ABDOMINAL HYSTERECTOMY CHOLECYSTECTOMY MULTIPLE ETRACTIONS WITH ALVEOPLASTY PARATIDECTOMY Family and Social History Unknown History: Yes; Current every day smoker - VAPORIZER; Marital Status - Married; Alcohol Use: Never; Drug Use: No History; Caffeine Use: Never; Financial Concerns: No; Food, Clothing or Shelter Needs: No; Support System Lacking: No; Transportation Concerns: No Electronic Signature(s) Signed: 06/24/2021 9:35:30 AM By: Kalman Shan DO Signed: 06/27/2021 4:37:55 PM By: Baruch Gouty RN, BSN Entered By: Kalman Shan on 06/24/2021 09:31:27 -------------------------------------------------------------------------------- SuperBill Details Patient Name: Date of Service: Melissa Case 06/24/2021 Medical Record  Number: [id number] Patient Account Number: [id number] Date of Birth/Sex: Treating RN: 16-Jan-1961 (61 y.o. [name] Primary Care Provider: Durene Fruits Other Clinician: Referring Provider: Treating Provider/Extender: Wilber Oliphant, Amy Weeks in Treatment: 7 Diagnosis Coding ICD-10 Codes Code Description [phone] Pressure ulcer of sacral region, stage 2 L89.212 Pressure ulcer of right hip, stage 2 R56.9 Unspecified convulsions C07 Malignant neoplasm of parotid gland Facility Procedures CPT4 Code: [id number] Description: [phone] - WOUND CARE VISIT-LEV 2 EST PT Modifier: Quantity: 1 Physician Procedures : CPT4 Code Description Modifier [id number] - WC PHYS LEVEL 3 - EST PT ICD-10 Diagnosis Description L89.152 Pressure ulcer of sacral region, stage 2 Quantity: 1 Electronic Signature(s) Signed: 06/24/2021 9:35:30 AM By: Kalman Shan DO Entered By: Kalman Shan on 06/24/2021 09:35:08

## 2021-06-27 NOTE — Progress Notes (Signed)
Acquired: 04/21/2021 Weeks Of Treatment: 7 Clustered Wound: Yes Photos Wound Measurements Length: (cm) 0.5 Width: (cm) 0.8 Depth: (cm) 0.1 Clustered Quantity: 4 Area: (cm) 0.314 Volume: (cm) 0.031 % Reduction in Area: 99.6% % Reduction in Volume: 99.6% Epithelialization: Large (67-100%) Tunneling: No Undermining: No Wound Description Classification: Category/Stage II Wound Margin: Distinct, outline attached Exudate Amount: Medium Exudate Type: Serosanguineous Exudate Color: red, brown Foul Odor  After Cleansing: No Slough/Fibrino No Wound Bed Granulation Amount: Large (67-100%) Exposed Structure Granulation Quality: Red, Pink Fascia Exposed: No Necrotic Amount: None Present (0%) Fat Layer (Subcutaneous Tissue) Exposed: Yes Tendon Exposed: No Muscle Exposed: No Joint Exposed: No Bone Exposed: No Treatment Notes Wound #1 (Sacrum) Cleanser Soap and Water Discharge Instruction: May shower and wash wound with dial antibacterial soap and water prior to dressing change. Peri-Wound Care Zinc Oxide Ointment 30g tube Discharge Instruction: or Laverta Baltimore Cream Topical Gentamicin Discharge Instruction: As directed by physician Primary Dressing Secondary Dressing ABD Pad, 5x9 Discharge Instruction: Apply over primary dressing as directed. Secured With Compression Wrap Compression Stockings Environmental education officer) Signed: 06/24/2021 1:32:11 PM By: Lorrin Jackson Entered By: Lorrin Jackson on 06/24/2021 08:10:28 -------------------------------------------------------------------------------- Vitals Details Patient Name: Date of Service: Melissa Case 06/24/2021 8:30 A M Medical Record Number: [id number] Patient Account Number: [id number] Date of Birth/Sex: Treating RN: 1960/05/24 (61 y.o. [name] Primary Care Burnie Hank: Durene Fruits Other Clinician: Referring Rakan Soffer: Treating Makaiyah Schweiger/Extender: Wilber Oliphant, Amy Weeks in Treatment: 7 Vital Signs Time Taken: 08:04 Temperature (F): 97.6 Height (in): 64 Pulse (bpm): 70 Weight (lbs): 167 Respiratory Rate (breaths/min): 18 Body Mass Index (BMI): 28.7 Blood Pressure (mmHg): 148/85 Reference Range: 80 - 120 mg / dl Electronic Signature(s) Signed: 06/24/2021 1:32:11 PM By: Lorrin Jackson Entered By: Lorrin Jackson on 06/24/2021 08:04:27  N/A N/A Classification: Medium N/A N/A Exudate A mount: Serosanguineous N/A N/A Exudate Type: red, brown N/A N/A Exudate Color: Distinct, outline attached N/A N/A Wound Margin: Large (67-100%) N/A N/A Granulation A mount: Red, Pink N/A N/A Granulation Quality: None Present (0%) N/A N/A Necrotic A mount: Fat Layer (Subcutaneous Tissue): Yes N/A N/A Exposed Structures: Fascia: No Tendon: No Muscle: No Joint: No Bone: No Large (67-100%) N/A N/A Epithelialization: Treatment Notes Wound #1 (Sacrum) Cleanser Soap and Water Discharge Instruction: May shower and wash wound with dial antibacterial soap and water prior to dressing change. Peri-Wound Care Zinc Oxide Ointment 30g tube Discharge Instruction: or Laverta Baltimore Cream Topical Gentamicin Discharge Instruction: As directed by physician Primary Dressing Secondary Dressing ABD Pad, 5x9 Discharge Instruction: Apply over primary dressing as directed. Secured With Compression Wrap Compression Stockings Environmental education officer) Signed: 06/24/2021 9:35:30 AM By: Kalman Shan DO Signed: 06/27/2021 4:37:55 PM By: Baruch Gouty RN, BSN Entered By: Kalman Shan on 06/24/2021 09:30:28 -------------------------------------------------------------------------------- Multi-Disciplinary Care Plan Details Patient Name: Date of Service: Melissa Case 06/24/2021 8:30 A M Medical Record Number: [id number] Patient Account Number: [id number] Date of  Birth/Sex: Treating RN: 1961/02/18 (61 y.o. [name] Primary Care Akshith Moncus: Durene Fruits Other Clinician: Referring Zacari Radick: Treating Tomeeka Plaugher/Extender: Wilber Oliphant, Amy Weeks in Treatment: 7 Active Inactive Wound/Skin Impairment Nursing Diagnoses: Impaired tissue integrity Knowledge deficit related to ulceration/compromised skin integrity Goals: Patient will have a decrease in wound volume by X% from date: (specify in notes) Date Initiated: 05/06/2021 Target Resolution Date: 07/22/2021 Goal Status: Active Patient/caregiver will verbalize understanding of skin care regimen Date Initiated: 05/06/2021 Target Resolution Date: 07/22/2021 Goal Status: Active Interventions: Assess patient/caregiver ability to obtain necessary supplies Assess patient/caregiver ability to perform ulcer/skin care regimen upon admission and as needed Assess ulceration(s) every visit Notes: Electronic Signature(s) Signed: 06/24/2021 1:32:11 PM By: Lorrin Jackson Entered By: Lorrin Jackson on 06/24/2021 07:59:20 -------------------------------------------------------------------------------- Pain Assessment Details Patient Name: Date of Service: Melissa Case 06/24/2021 8:30 A M Medical Record Number: [id number] Patient Account Number: [id number] Date of Birth/Sex: Treating RN: 21-Oct-1960 (61 y.o. [name] Primary Care Ashwath Lasch: Durene Fruits Other Clinician: Referring Malachai Schalk: Treating Mignon Bechler/Extender: Wilber Oliphant, Amy Weeks in Treatment: 7 Active Problems Location of Pain Severity and Description of Pain Patient Has Paino No Site Locations Pain Management and Medication Current Pain Management: Electronic Signature(s) Signed: 06/24/2021 1:32:11 PM By: Lorrin Jackson Entered By: Lorrin Jackson on 06/24/2021 08:04:38 -------------------------------------------------------------------------------- Patient/Caregiver Education Details Patient  Name: Date of Service: Melissa Case 2/3/2023andnbsp8:30 A M Medical Record Number: [id number] Patient Account Number: [id number] Date of Birth/Gender: Treating RN: Jun 10, 1960 (61 y.o. [name] Primary Care Physician: Durene Fruits Other Clinician: Referring Physician: Treating Physician/Extender: Brennan Bailey Weeks in Treatment: 7 Education Assessment Education Provided To: Patient Education Topics Provided Pressure: Methods: Explain/Verbal, Printed Responses: State content correctly Wound/Skin Impairment: Methods: Explain/Verbal, Printed Responses: State content correctly Electronic Signature(s) Signed: 06/24/2021 1:32:11 PM By: Lorrin Jackson Entered By: Lorrin Jackson on 06/24/2021 07:59:47 -------------------------------------------------------------------------------- Wound Assessment Details Patient Name: Date of Service: Melissa Case 06/24/2021 8:30 A M Medical Record Number: [id number] Patient Account Number: [id number] Date of Birth/Sex: Treating RN: 02-Apr-1961 (60 y.o. [name] Primary Care Jamareon Shimel: Durene Fruits Other Clinician: Referring Mattox Schorr: Treating Ronith Berti/Extender: Wilber Oliphant, Amy Weeks in Treatment: 7 Wound Status Wound Number: 1 Primary Etiology: Pressure Ulcer Wound Location: Sacrum Wound Status: Open Wounding Event: Pressure Injury Comorbid History: Seizure Disorder Date  N/A N/A Classification: Medium N/A N/A Exudate A mount: Serosanguineous N/A N/A Exudate Type: red, brown N/A N/A Exudate Color: Distinct, outline attached N/A N/A Wound Margin: Large (67-100%) N/A N/A Granulation A mount: Red, Pink N/A N/A Granulation Quality: None Present (0%) N/A N/A Necrotic A mount: Fat Layer (Subcutaneous Tissue): Yes N/A N/A Exposed Structures: Fascia: No Tendon: No Muscle: No Joint: No Bone: No Large (67-100%) N/A N/A Epithelialization: Treatment Notes Wound #1 (Sacrum) Cleanser Soap and Water Discharge Instruction: May shower and wash wound with dial antibacterial soap and water prior to dressing change. Peri-Wound Care Zinc Oxide Ointment 30g tube Discharge Instruction: or Laverta Baltimore Cream Topical Gentamicin Discharge Instruction: As directed by physician Primary Dressing Secondary Dressing ABD Pad, 5x9 Discharge Instruction: Apply over primary dressing as directed. Secured With Compression Wrap Compression Stockings Environmental education officer) Signed: 06/24/2021 9:35:30 AM By: Kalman Shan DO Signed: 06/27/2021 4:37:55 PM By: Baruch Gouty RN, BSN Entered By: Kalman Shan on 06/24/2021 09:30:28 -------------------------------------------------------------------------------- Multi-Disciplinary Care Plan Details Patient Name: Date of Service: Melissa Case 06/24/2021 8:30 A M Medical Record Number: [id number] Patient Account Number: [id number] Date of  Birth/Sex: Treating RN: 1961/02/18 (61 y.o. [name] Primary Care Akshith Moncus: Durene Fruits Other Clinician: Referring Zacari Radick: Treating Tomeeka Plaugher/Extender: Wilber Oliphant, Amy Weeks in Treatment: 7 Active Inactive Wound/Skin Impairment Nursing Diagnoses: Impaired tissue integrity Knowledge deficit related to ulceration/compromised skin integrity Goals: Patient will have a decrease in wound volume by X% from date: (specify in notes) Date Initiated: 05/06/2021 Target Resolution Date: 07/22/2021 Goal Status: Active Patient/caregiver will verbalize understanding of skin care regimen Date Initiated: 05/06/2021 Target Resolution Date: 07/22/2021 Goal Status: Active Interventions: Assess patient/caregiver ability to obtain necessary supplies Assess patient/caregiver ability to perform ulcer/skin care regimen upon admission and as needed Assess ulceration(s) every visit Notes: Electronic Signature(s) Signed: 06/24/2021 1:32:11 PM By: Lorrin Jackson Entered By: Lorrin Jackson on 06/24/2021 07:59:20 -------------------------------------------------------------------------------- Pain Assessment Details Patient Name: Date of Service: Melissa Case 06/24/2021 8:30 A M Medical Record Number: [id number] Patient Account Number: [id number] Date of Birth/Sex: Treating RN: 21-Oct-1960 (61 y.o. [name] Primary Care Ashwath Lasch: Durene Fruits Other Clinician: Referring Malachai Schalk: Treating Mignon Bechler/Extender: Wilber Oliphant, Amy Weeks in Treatment: 7 Active Problems Location of Pain Severity and Description of Pain Patient Has Paino No Site Locations Pain Management and Medication Current Pain Management: Electronic Signature(s) Signed: 06/24/2021 1:32:11 PM By: Lorrin Jackson Entered By: Lorrin Jackson on 06/24/2021 08:04:38 -------------------------------------------------------------------------------- Patient/Caregiver Education Details Patient  Name: Date of Service: Melissa Case 2/3/2023andnbsp8:30 A M Medical Record Number: [id number] Patient Account Number: [id number] Date of Birth/Gender: Treating RN: Jun 10, 1960 (61 y.o. [name] Primary Care Physician: Durene Fruits Other Clinician: Referring Physician: Treating Physician/Extender: Brennan Bailey Weeks in Treatment: 7 Education Assessment Education Provided To: Patient Education Topics Provided Pressure: Methods: Explain/Verbal, Printed Responses: State content correctly Wound/Skin Impairment: Methods: Explain/Verbal, Printed Responses: State content correctly Electronic Signature(s) Signed: 06/24/2021 1:32:11 PM By: Lorrin Jackson Entered By: Lorrin Jackson on 06/24/2021 07:59:47 -------------------------------------------------------------------------------- Wound Assessment Details Patient Name: Date of Service: Melissa Case 06/24/2021 8:30 A M Medical Record Number: [id number] Patient Account Number: [id number] Date of Birth/Sex: Treating RN: 02-Apr-1961 (60 y.o. [name] Primary Care Jamareon Shimel: Durene Fruits Other Clinician: Referring Mattox Schorr: Treating Ronith Berti/Extender: Wilber Oliphant, Amy Weeks in Treatment: 7 Wound Status Wound Number: 1 Primary Etiology: Pressure Ulcer Wound Location: Sacrum Wound Status: Open Wounding Event: Pressure Injury Comorbid History: Seizure Disorder Date  Acquired: 04/21/2021 Weeks Of Treatment: 7 Clustered Wound: Yes Photos Wound Measurements Length: (cm) 0.5 Width: (cm) 0.8 Depth: (cm) 0.1 Clustered Quantity: 4 Area: (cm) 0.314 Volume: (cm) 0.031 % Reduction in Area: 99.6% % Reduction in Volume: 99.6% Epithelialization: Large (67-100%) Tunneling: No Undermining: No Wound Description Classification: Category/Stage II Wound Margin: Distinct, outline attached Exudate Amount: Medium Exudate Type: Serosanguineous Exudate Color: red, brown Foul Odor  After Cleansing: No Slough/Fibrino No Wound Bed Granulation Amount: Large (67-100%) Exposed Structure Granulation Quality: Red, Pink Fascia Exposed: No Necrotic Amount: None Present (0%) Fat Layer (Subcutaneous Tissue) Exposed: Yes Tendon Exposed: No Muscle Exposed: No Joint Exposed: No Bone Exposed: No Treatment Notes Wound #1 (Sacrum) Cleanser Soap and Water Discharge Instruction: May shower and wash wound with dial antibacterial soap and water prior to dressing change. Peri-Wound Care Zinc Oxide Ointment 30g tube Discharge Instruction: or Laverta Baltimore Cream Topical Gentamicin Discharge Instruction: As directed by physician Primary Dressing Secondary Dressing ABD Pad, 5x9 Discharge Instruction: Apply over primary dressing as directed. Secured With Compression Wrap Compression Stockings Environmental education officer) Signed: 06/24/2021 1:32:11 PM By: Lorrin Jackson Entered By: Lorrin Jackson on 06/24/2021 08:10:28 -------------------------------------------------------------------------------- Vitals Details Patient Name: Date of Service: Melissa Case 06/24/2021 8:30 A M Medical Record Number: [id number] Patient Account Number: [id number] Date of Birth/Sex: Treating RN: 1960/05/24 (61 y.o. [name] Primary Care Burnie Hank: Durene Fruits Other Clinician: Referring Rakan Soffer: Treating Makaiyah Schweiger/Extender: Wilber Oliphant, Amy Weeks in Treatment: 7 Vital Signs Time Taken: 08:04 Temperature (F): 97.6 Height (in): 64 Pulse (bpm): 70 Weight (lbs): 167 Respiratory Rate (breaths/min): 18 Body Mass Index (BMI): 28.7 Blood Pressure (mmHg): 148/85 Reference Range: 80 - 120 mg / dl Electronic Signature(s) Signed: 06/24/2021 1:32:11 PM By: Lorrin Jackson Entered By: Lorrin Jackson on 06/24/2021 08:04:27

## 2021-07-01 ENCOUNTER — Encounter (HOSPITAL_COMMUNITY): Payer: Self-pay | Admitting: Psychiatry

## 2021-07-01 ENCOUNTER — Telehealth (HOSPITAL_BASED_OUTPATIENT_CLINIC_OR_DEPARTMENT_OTHER): Payer: BC Managed Care – PPO | Admitting: Psychiatry

## 2021-07-01 ENCOUNTER — Other Ambulatory Visit: Payer: Self-pay

## 2021-07-01 DIAGNOSIS — F330 Major depressive disorder, recurrent, mild: Secondary | ICD-10-CM | POA: Diagnosis not present

## 2021-07-01 DIAGNOSIS — F419 Anxiety disorder, unspecified: Secondary | ICD-10-CM | POA: Diagnosis not present

## 2021-07-01 MED ORDER — PAROXETINE HCL 40 MG PO TABS: 40.0000 mg | ORAL_TABLET | Freq: Every day | ORAL | 0 refills | Status: DC

## 2021-07-01 NOTE — Progress Notes (Signed)
Virtual Visit via Telephone Note  I connected with Melissa Case on 07/01/21 at 10:00 AM EST by telephone and verified that I am speaking with the correct person using two identifiers.  Location: Patient: Home Provider: Home Office   I discussed the limitations, risks, security and privacy concerns of performing an evaluation and management service by telephone and the availability of in person appointments. I also discussed with the patient that there may be a patient responsible charge related to this service. The patient expressed understanding and agreed to proceed.   History of Present Illness: Patient is evaluated by phone session.  Her husband Melissa Case was also present in the session.  Since the last visit patient been hospitalized a few times because of multiple health issues.  Now she is on a seizure medicine as she has multiple seizures.  She also taking steroids however plan is to reduce slowly and gradually.  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  Patient on the phone looks tired but in good spirits.  She understands that it will take some time to get back to normal.  Overall she is less confused and able to comprehend better than last visit.  She is happy that her youngest and middle son visited her regularly and able to help to clean the house.  Patient endorsed husband is very supportive and all the time with her.  She also get help from husband for her daily routines like bathing, changing.  Patient told the nurse comes once a week.  Her physical therapy was canceled because of blood clot.  Patient started walking with the help of walker as a still struggling to keep the balance good.  She denies any crying spells or any feeling of hopelessness.  She denies any panic attack.  Her long-term plan is to go back to work which she missed.  She is taking Paxil and does not want to change the dose.  She has no tremors or any concern  from the medication.  She denies any paranoia, hallucination or any feeling of hopelessness.    Past Psychiatric History:  H/O anxiety and depression. No h/o inpatient treatment, suicidal attempt, or psychosis. Trazodone worked.   Psychiatric Specialty Exam: Physical Exam  Review of Systems  Weight 178 lb (80.7 kg).There is no height or weight on file to calculate BMI.  General Appearance: NA  Eye Contact:  NA  Speech:  Slow  Volume:  Decreased  Mood:  Anxious  Affect:  NA  Thought Process:  Goal Directed  Orientation:  Full (Time, Place, and Person)  Thought Content:  Rumination  Suicidal Thoughts:  No  Homicidal Thoughts:  No  Memory:  Immediate;   Fair Recent;   Fair Remote;   Fair  Judgement:  Intact  Insight:  Present  Psychomotor Activity:  NA  Concentration:  Concentration: Fair and Attention Span: Fair  Recall:  [employer] of Knowledge:  Fair  Language:  Fair  Akathisia:  No  Handed:  Right  AIMS (if indicated):     Assets:  Communication Skills Desire for Improvement Housing  ADL's:  Intact  Cognition:  Impaired,  Mild  Sleep:         Assessment and Plan: Major depressive disorder, recurrent.  Anxiety.  Patient is taking Paxil and despite multiple health issues she is in good spirits.  She is not taking seizure control medicine.  She is on steroids which she  hoping to taper down eventually.  Discussed medication side effects and benefits.  Continue Paxil 40 mg daily.  Recommended to call us back if she has any question or any concern.  Follow-up in 3 months.  Follow Up Instructions:    I discussed the assessment and treatment plan with the patient. The patient was provided an opportunity to ask questions and all were answered. The patient agreed with the plan and demonstrated an understanding of the instructions.   The patient was advised to call back or seek an in-person evaluation if the symptoms worsen or if the condition fails to improve as  anticipated.  I provided 19 minutes of non-face-to-face time during this encounter.   Kathlee Nations, MD

## 2021-07-08 ENCOUNTER — Encounter (HOSPITAL_BASED_OUTPATIENT_CLINIC_OR_DEPARTMENT_OTHER): Payer: BC Managed Care – PPO | Admitting: Internal Medicine

## 2021-07-08 ENCOUNTER — Other Ambulatory Visit: Payer: Self-pay | Admitting: Family

## 2021-07-08 ENCOUNTER — Other Ambulatory Visit: Payer: Self-pay | Admitting: Neurology

## 2021-07-08 ENCOUNTER — Telehealth: Payer: Self-pay | Admitting: Family

## 2021-07-08 DIAGNOSIS — I824Y9 Acute embolism and thrombosis of unspecified deep veins of unspecified proximal lower extremity: Secondary | ICD-10-CM

## 2021-07-08 DIAGNOSIS — I2699 Other pulmonary embolism without acute cor pulmonale: Secondary | ICD-10-CM

## 2021-07-08 NOTE — Telephone Encounter (Signed)
Nurse Glenard Haring from [facility] informs PCP that this pt missed a visit since Pt's husband cancelled stating Mrs Melissa Case has a Dr's appt. Nurse Glenard Haring states she knows it's not at PCP's office so wanted to keep PCP informed of "odd behavior" and missed appt from pt.

## 2021-07-11 ENCOUNTER — Encounter: Payer: Self-pay | Admitting: Gastroenterology

## 2021-07-11 DIAGNOSIS — R7303 Prediabetes: Secondary | ICD-10-CM | POA: Diagnosis not present

## 2021-07-11 DIAGNOSIS — E785 Hyperlipidemia, unspecified: Secondary | ICD-10-CM | POA: Diagnosis not present

## 2021-07-11 DIAGNOSIS — F32A Depression, unspecified: Secondary | ICD-10-CM | POA: Diagnosis not present

## 2021-07-11 DIAGNOSIS — G40909 Epilepsy, unspecified, not intractable, without status epilepticus: Secondary | ICD-10-CM | POA: Diagnosis not present

## 2021-07-11 DIAGNOSIS — I2510 Atherosclerotic heart disease of native coronary artery without angina pectoris: Secondary | ICD-10-CM | POA: Diagnosis not present

## 2021-07-11 DIAGNOSIS — F410 Panic disorder [episodic paroxysmal anxiety] without agoraphobia: Secondary | ICD-10-CM | POA: Diagnosis not present

## 2021-07-11 DIAGNOSIS — I82433 Acute embolism and thrombosis of popliteal vein, bilateral: Secondary | ICD-10-CM | POA: Diagnosis not present

## 2021-07-11 DIAGNOSIS — G510 Bell's palsy: Secondary | ICD-10-CM | POA: Diagnosis not present

## 2021-07-11 DIAGNOSIS — I2699 Other pulmonary embolism without acute cor pulmonale: Secondary | ICD-10-CM | POA: Diagnosis not present

## 2021-07-11 DIAGNOSIS — K8020 Calculus of gallbladder without cholecystitis without obstruction: Secondary | ICD-10-CM | POA: Diagnosis not present

## 2021-07-11 DIAGNOSIS — D649 Anemia, unspecified: Secondary | ICD-10-CM | POA: Diagnosis not present

## 2021-07-11 DIAGNOSIS — K2270 Barrett's esophagus without dysplasia: Secondary | ICD-10-CM | POA: Diagnosis not present

## 2021-07-11 DIAGNOSIS — K645 Perianal venous thrombosis: Secondary | ICD-10-CM | POA: Diagnosis not present

## 2021-07-11 DIAGNOSIS — K219 Gastro-esophageal reflux disease without esophagitis: Secondary | ICD-10-CM | POA: Diagnosis not present

## 2021-07-11 DIAGNOSIS — J9601 Acute respiratory failure with hypoxia: Secondary | ICD-10-CM | POA: Diagnosis not present

## 2021-07-11 DIAGNOSIS — I119 Hypertensive heart disease without heart failure: Secondary | ICD-10-CM | POA: Diagnosis not present

## 2021-07-11 NOTE — Telephone Encounter (Signed)
Make patient aware Eliquis refilled per request. Also, very important for patient to keep all scheduled appointments with Cardiology and Pulmonology.

## 2021-07-12 ENCOUNTER — Emergency Department (HOSPITAL_COMMUNITY): Payer: BC Managed Care – PPO

## 2021-07-12 ENCOUNTER — Emergency Department (HOSPITAL_COMMUNITY)
Admission: EM | Admit: 2021-07-12 | Discharge: 2021-07-12 | Disposition: A | Discharge status: Home / Self Care | Payer: BC Managed Care – PPO | Attending: Emergency Medicine | Admitting: Emergency Medicine

## 2021-07-12 ENCOUNTER — Encounter: Payer: Self-pay | Admitting: Neurology

## 2021-07-12 DIAGNOSIS — R9431 Abnormal electrocardiogram [ECG] [EKG]: Secondary | ICD-10-CM | POA: Diagnosis not present

## 2021-07-12 DIAGNOSIS — M7989 Other specified soft tissue disorders: Secondary | ICD-10-CM | POA: Diagnosis not present

## 2021-07-12 DIAGNOSIS — R404 Transient alteration of awareness: Secondary | ICD-10-CM

## 2021-07-12 DIAGNOSIS — J9811 Atelectasis: Secondary | ICD-10-CM | POA: Diagnosis not present

## 2021-07-12 DIAGNOSIS — R4182 Altered mental status, unspecified: Secondary | ICD-10-CM | POA: Diagnosis not present

## 2021-07-12 DIAGNOSIS — R410 Disorientation, unspecified: Secondary | ICD-10-CM | POA: Diagnosis not present

## 2021-07-12 DIAGNOSIS — Z79899 Other long term (current) drug therapy: Secondary | ICD-10-CM | POA: Diagnosis not present

## 2021-07-12 DIAGNOSIS — R1013 Epigastric pain: Secondary | ICD-10-CM | POA: Diagnosis not present

## 2021-07-12 DIAGNOSIS — R109 Unspecified abdominal pain: Secondary | ICD-10-CM | POA: Diagnosis not present

## 2021-07-12 LAB — CBC WITH DIFFERENTIAL/PLATELET
Abs Immature Granulocytes: 0.28 10*3/uL — ABNORMAL HIGH (ref 0.00–0.07)
Basophils Absolute: 0 10*3/uL (ref 0.0–0.1)
Basophils Relative: 0 %
Eosinophils Absolute: 0 10*3/uL (ref 0.0–0.5)
Eosinophils Relative: 0 %
HCT: 38 % (ref 36.0–46.0)
Hemoglobin: 12.2 g/dL (ref 12.0–15.0)
Immature Granulocytes: 6 %
Lymphocytes Relative: 10 %
Lymphs Abs: 0.5 10*3/uL — ABNORMAL LOW (ref 0.7–4.0)
MCH: 33.4 pg (ref 26.0–34.0)
MCHC: 32.1 g/dL (ref 30.0–36.0)
MCV: 104.1 fL — ABNORMAL HIGH (ref 80.0–100.0)
Monocytes Absolute: 0.3 10*3/uL (ref 0.1–1.0)
Monocytes Relative: 7 %
Neutro Abs: 3.6 10*3/uL (ref 1.7–7.7)
Neutrophils Relative %: 77 %
Platelets: 193 10*3/uL (ref 150–400)
RBC: 3.65 MIL/uL — ABNORMAL LOW (ref 3.87–5.11)
RDW: 14.5 % (ref 11.5–15.5)
WBC: 4.6 10*3/uL (ref 4.0–10.5)
nRBC: 0 % (ref 0.0–0.2)

## 2021-07-12 LAB — URINALYSIS, ROUTINE W REFLEX MICROSCOPIC
Bilirubin Urine: NEGATIVE
Glucose, UA: NEGATIVE mg/dL
Ketones, ur: 5 mg/dL — AB
Leukocytes,Ua: NEGATIVE
Nitrite: NEGATIVE
Protein, ur: NEGATIVE mg/dL
Specific Gravity, Urine: 1.016 (ref 1.005–1.030)
pH: 7 (ref 5.0–8.0)

## 2021-07-12 LAB — BLOOD GAS, VENOUS
Acid-Base Excess: 7 mmol/L — ABNORMAL HIGH (ref 0.0–2.0)
Bicarbonate: 33.4 mmol/L — ABNORMAL HIGH (ref 20.0–28.0)
O2 Saturation: 49.2 %
Patient temperature: 37
pCO2, Ven: 54 mmHg (ref 44–60)
pH, Ven: 7.4 (ref 7.25–7.43)
pO2, Ven: 36 mmHg (ref 32–45)

## 2021-07-12 LAB — COMPREHENSIVE METABOLIC PANEL
ALT: 73 U/L — ABNORMAL HIGH (ref 0–44)
AST: 26 U/L (ref 15–41)
Albumin: 3.1 g/dL — ABNORMAL LOW (ref 3.5–5.0)
Alkaline Phosphatase: 34 U/L — ABNORMAL LOW (ref 38–126)
Anion gap: 7 (ref 5–15)
BUN: 28 mg/dL — ABNORMAL HIGH (ref 6–20)
CO2: 29 mmol/L (ref 22–32)
Calcium: 8.4 mg/dL — ABNORMAL LOW (ref 8.9–10.3)
Chloride: 99 mmol/L (ref 98–111)
Creatinine, Ser: 0.52 mg/dL (ref 0.44–1.00)
GFR, Estimated: >60 mL/min (ref >60)
Glucose, Bld: 131 mg/dL — ABNORMAL HIGH (ref 70–99)
Potassium: 4.6 mmol/L (ref 3.5–5.1)
Sodium: 135 mmol/L (ref 135–145)
Total Bilirubin: 0.1 mg/dL — ABNORMAL LOW (ref 0.3–1.2)
Total Protein: 5.8 g/dL — ABNORMAL LOW (ref 6.5–8.1)

## 2021-07-12 LAB — CBG MONITORING, ED: Glucose-Capillary: 155 mg/dL — ABNORMAL HIGH (ref 70–99)

## 2021-07-12 LAB — VALPROIC ACID LEVEL: Valproic Acid Lvl: 83 ug/mL (ref 50.0–100.0)

## 2021-07-12 LAB — AMMONIA: Ammonia: 54 umol/L — ABNORMAL HIGH (ref 9–35)

## 2021-07-12 MED ORDER — IOHEXOL 300 MG/ML  SOLN
100.0000 mL | Freq: Once | INTRAMUSCULAR | Status: AC | PRN
  Administered 2021-07-12: 100 mL via INTRAVENOUS

## 2021-07-12 NOTE — ED Provider Triage Note (Signed)
Emergency Medicine Provider Triage Evaluation Note  Rayme Bui , a 61 y.o. female  was evaluated in triage.  Pt complains of confusion and memory loss, seen here a few weeks ago and given a fluid pill, no longer taking and still has swelling in feet/legs. On steroid (currently 1/2 pill TID), this is not new.  Wakes up not knowing where she is, husband comes home from work at night and she looks at him like "where you been."  Worse over the past few days. No history of liver failure. Recent fall- 1 week ago, husband found pt lying on the bathroom floor, pt is not sure why she fell. FD had to come help her up.   Review of Systems  Positive: Confusion, memory loss, swelling Negative: Changes in bowel or bladder habits.  Physical Exam  BP (!) 147/80 (BP Location: Left Arm)    Pulse 76    Temp 98.8 F (37.1 C) (Oral)    Resp 16    SpO2 100%  Gen:   Awake, no distress   Resp:  Normal effort  MSK:   Moves extremities without difficulty  Other:  Alert to person, date, location  Medical Decision Making  Medically screening exam initiated at 12:16 PM.  Appropriate orders placed.  Penelopi Mikrut was informed that the remainder of the evaluation will be completed by another provider, this initial triage assessment does not replace that evaluation, and the importance of remaining in the ED until their evaluation is complete.     Tacy Learn, PA-C 07/12/21 1220

## 2021-07-12 NOTE — ED Provider Notes (Signed)
[facility] DEPT Provider Note   CSN: [id number] Arrival date & time: 07/12/21  1120     History  Chief Complaint  Patient presents with   Altered Mental Status    Melissa Case is a 61 y.o. female.  HPI  61 year old female with history of parotid cancer, seizures, lipidemia, DVT, on chronic anticoagulation, presents today stating that she has had some increased confusion and weakness.  She states that she feels like she wakes up she is confused as to where she is.  Her husband states that her "timeline is off.  She has fallen several times.  She denies any headache or head injury.  She denies any chest pain, dyspnea, abdominal pain, nausea, vomiting, diarrhea, or decreased p.o. intake.  Her husband states he thinks that she is actually taking in more by mouth than usual.  She does have increased peripheral edema.  She reports that we started her on fluid medication last time she was here and do not think that it has really improved.  They report no recent evidence of seizures.  She reports taking her medication including Keppra as prescribed    Home Medications Prior to Admission medications   Medication Sig Start Date End Date Taking? Authorizing Provider  dexamethasone (DECADRON) 4 MG tablet Take 1 tablet (4 mg total) by mouth every 12 (twelve) hours. Patient taking differently: Take 2 mg by mouth 3 (three) times daily. 05/14/21  Yes Gifford Shave, MD  divalproex (DEPAKOTE) 250 MG DR tablet Take 1 tablet (250 mg total) by mouth 2 (two) times daily. (In addition to 500mg  twice daily for a total dosage of 750mg  BID). 04/19/21  Yes Alric Ran, MD  divalproex (DEPAKOTE) 500 MG DR tablet Take 1 tablet by mouth twice daily 07/11/21  Yes Camara, Maryan Puls, MD  ELIQUIS 5 MG TABS tablet Take 1 tablet by mouth twice daily 07/11/21  Yes Minette Brine, Amy J, NP  esomeprazole (NEXIUM) 40 MG capsule Take 1 capsule by mouth once daily 05/03/21  Yes Beavers, Joelene Millin, MD   gentamicin cream (GARAMYCIN) 0.1 % Apply 1 application topically at bedtime. 06/24/21  Yes [provider]  levETIRAcetam (KEPPRA) 500 MG tablet Take 500 mg by mouth 2 (two) times daily.   Yes [provider]  ondansetron (ZOFRAN-ODT) 4 MG disintegrating tablet Take 4 mg by mouth every 8 (eight) hours as needed for nausea or vomiting.   Yes [provider]  oxyCODONE (ROXICODONE) 5 MG immediate release tablet Take 1 tablet (5 mg total) by mouth every 6 (six) hours as needed for up to 15 doses for severe pain. 06/20/21  Yes Wyvonnia Dusky, MD  PARoxetine (PAXIL) 40 MG tablet Take 1 tablet (40 mg total) by mouth daily. 07/01/21  Yes Arfeen, Arlyce Harman, MD  docusate sodium (COLACE) 100 MG capsule Take 1 capsule (100 mg total) by mouth daily as needed for up to 30 doses for mild constipation. To prevent constipation while taking oxycodone Patient not taking: Reported on 07/12/2021 06/20/21   Wyvonnia Dusky, MD  furosemide (LASIX) 20 MG tablet Take 2 tablets (40 mg total) by mouth daily for 5 days. 06/13/21 06/18/21  Deno Etienne, DO  furosemide (LASIX) 40 MG tablet Take 1 tablet (40 mg total) by mouth daily for 14 doses. 06/20/21 07/04/21  Wyvonnia Dusky, MD  Nystatin (GERHARDT'S BUTT CREAM) CREA Apply 1 application topically 3 (three) times daily. Patient not taking: Reported on 07/12/2021 05/14/21   Gifford Shave, MD  Allergies    Morphine and Penicillins    Review of Systems   Review of Systems  Physical Exam Updated Vital Signs BP (!) 151/84    Pulse 72    Temp 98.8 F (37.1 C) (Oral)    Resp 17    SpO2 96%  Physical Exam  ED Results / Procedures / Treatments   Labs (all labs ordered are listed, but only abnormal results are displayed) Labs Reviewed  COMPREHENSIVE METABOLIC PANEL - Abnormal; Notable for the following components:      Result Value   Glucose, Bld 131 (*)    BUN 28 (*)    Calcium 8.4 (*)    Total Protein 5.8 (*)    Albumin 3.1 (*)    ALT  73 (*)    Alkaline Phosphatase 34 (*)    Total Bilirubin 0.1 (*)    All other components within normal limits  CBC WITH DIFFERENTIAL/PLATELET - Abnormal; Notable for the following components:   RBC 3.65 (*)    MCV 104.1 (*)    All other components within normal limits  AMMONIA - Abnormal; Notable for the following components:   Ammonia 54 (*)    All other components within normal limits  BLOOD GAS, VENOUS - Abnormal; Notable for the following components:   Bicarbonate 33.4 (*)    Acid-Base Excess 7.0 (*)    All other components within normal limits  CBG MONITORING, ED - Abnormal; Notable for the following components:   Glucose-Capillary 155 (*)    All other components within normal limits  URINALYSIS, ROUTINE W REFLEX MICROSCOPIC  VALPROIC ACID LEVEL    EKG EKG Interpretation  Date/Time:  Tuesday July 12 2021 12:38:21 EST Ventricular Rate:  77 PR Interval:    QRS Duration: 84 QT Interval:  380 QTC Calculation: 433 R Axis:   -5 Text Interpretation: Atrial fibrillation Low voltage, precordial leads Consider anterior infarct ST elevation, consider inferior injury Baseline wander in lead(s) I II aVR Poor data quality, interpretation may be adversely affected plan repeat Confirmed by Pattricia Boss [phone]) on 07/12/2021 1:59:26 PM  Radiology DG Chest 2 View  Result Date: 07/12/2021 CLINICAL DATA:  Confusion, leg swelling EXAM: CHEST - 2 VIEW COMPARISON:  06/13/2021 FINDINGS: Heart size and vascularity normal. Improvement in airspace disease in the right mid lung which likely is atelectasis. No pneumonia or effusion. Negative for heart failure. IMPRESSION: Improvement in atelectasis in the right lung.  No new findings. Electronically Signed   By: Franchot Gallo M.D.   On: 07/12/2021 13:11   CT Head Wo Contrast  Result Date: 07/12/2021 CLINICAL DATA:  Provided history: Mental status change, unknown cause. Recent fall. EXAM: CT HEAD WITHOUT CONTRAST TECHNIQUE: Contiguous axial  images were obtained from the base of the skull through the vertex without intravenous contrast. RADIATION DOSE REDUCTION: This exam was performed according to the departmental dose-optimization program which includes automated exposure control, adjustment of the mA and/or kV according to patient size and/or use of iterative reconstruction technique. COMPARISON:  Prior head CT examinations 05/11/2021 and earlier. Brain MRI 04/13/2021. FINDINGS: Brain: Mild generalized cerebral atrophy. Redemonstrated focus of abnormal hypodensity with scattered calcifications in the mid-to-anterior right temporal lobe. Mild patchy ill-defined hypoattenuation elsewhere within the cerebral white matter, nonspecific but compatible with chronic small vessel ischemic disease. There is no acute intracranial hemorrhage. No acute demarcated cortical infarct. No extra-axial fluid collection. No midline shift. Vascular: No hyperdense vessel.  Atherosclerotic calcifications. Skull: Normal. Negative for fracture or focal lesion. Sinuses/Orbits:  Visualized orbits show no acute finding. Metallic focus along the anterior aspect of the right globe, which may reflect an eyelid weight. Trace mucosal thickening within the right maxillary sinus at the imaged levels. Other: Large bilateral middle ear/mastoid effusions. IMPRESSION: No evidence of acute intracranial hemorrhage or acute infarct. Redemonstrated focus of abnormal hypodensity with scattered calcifications in the mid-to-anterior right temporal lobe. 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 Background mild chronic small vessel ischemic changes within the cerebral white matter. Mild generalized cerebral atrophy. Large bilateral middle ear/mastoid effusions. Electronically Signed   By: Kellie Simmering D.O.   On: 07/12/2021 13:38    Procedures Procedures    Medications Ordered in ED Medications - No data  to display  ED Course/ Medical Decision Making/ A&P Clinical Course as of 07/12/21 1526  Tue Jul 12, 2021  1510 Parotid ca s/p resection, dvt/pe on eliquis, AMS, pending CTAP and UA and depakote [MK]  1525 VBG reviewed and does not appear to be abnormal with no evidence of hypercarbia or or hypoxic failure CBC reviewed with normal white blood cell count and normal hemoglobin Sodium potassium chloride are normal some elevation of LFTs, mild elevation of ammonia CBG 155 [DR]    Clinical Course User Index [DR] Pattricia Boss, MD [MK] Kommor, Madison, MD                           Medical Decision Making 61 yo female with history of seizures disorder on keppra and depakote presents with some increased times.  Head CT here does not show evidence of acute abnormality Ammonia level 54 VBG without any evidence of acute Patient has some elevated ammonia mildly elevated LFTs Discussed care with Dr. De Burrs who will evaluate patient after urinalysis, Depakote level returned. Anticipate discharge if these are within normal limits  Amount and/or Complexity of Data Reviewed Independent Historian: spouse Labs: ordered. Decision-making details documented in ED Course. Radiology: ordered and independent interpretation performed. Decision-making details documented in ED Course.           Final Clinical Impression(s) / ED Diagnoses Final diagnoses:  Transient alteration of awareness    Rx / DC Orders ED Discharge Orders     None         Pattricia Boss, MD 07/12/21 1527

## 2021-07-12 NOTE — ED Triage Notes (Signed)
Pt arrived via POV, spouse states pt has been confused and having issues with short term memory for the last 3 days. States hx of "brain pressure". Pt able to answer questions correctly, but cont with bizare sentences. Cont asking about flu shot. Per husband, pt has been going outside of house and does not remember doing this, and does not remembers phone calls with husband over the last three days. No focal deficit. Facial droop present, states this is baseline from previous surgery per pt and spouse. No acute changes within the last 24 hrs.

## 2021-07-12 NOTE — Discharge Instructions (Signed)
You were seen in the emergency department today for evaluation of transient confusion in the mornings.  Your work-up today was reassuringly normal and I do not see any evidence of new or worsening swelling in the brain, electrolyte problems or infection.  At this time you are safe for discharge but please call your neurology team to have you be seen outside the hospital for this intermittent confusion.  Return to the emergency department if you have new or worsening confusion, chest pain, shortness of breath, fevers or any other concerning symptoms.

## 2021-07-14 ENCOUNTER — Other Ambulatory Visit (HOSPITAL_COMMUNITY): Payer: Self-pay | Admitting: Psychiatry

## 2021-07-14 DIAGNOSIS — F419 Anxiety disorder, unspecified: Secondary | ICD-10-CM

## 2021-07-14 DIAGNOSIS — F330 Major depressive disorder, recurrent, mild: Secondary | ICD-10-CM

## 2021-07-15 ENCOUNTER — Telehealth (HOSPITAL_COMMUNITY): Payer: Self-pay | Admitting: *Deleted

## 2021-07-15 ENCOUNTER — Encounter (HOSPITAL_COMMUNITY): Payer: PRIVATE HEALTH INSURANCE

## 2021-07-15 DIAGNOSIS — J9601 Acute respiratory failure with hypoxia: Secondary | ICD-10-CM | POA: Diagnosis not present

## 2021-07-15 MED ORDER — TRAZODONE HCL 50 MG PO TABS: 25.0000 mg | ORAL_TABLET | Freq: Every day | ORAL | 0 refills | Status: DC

## 2021-07-15 NOTE — Telephone Encounter (Signed)
Pt's husband, [name], left message requesting refill of Trazodone. Last Rx was written by you on 07/21/19 #20. Trazodone 50 mg. FYI pt just presented to [facility] on 07/12/21 with increased confusion, short term memory impairment. Husband states she's not been sleeping and thinks this is adding to the increase in confusion. Pt is seen at [facility]. Pt next scheduled appointment with this office is 09/30/21. Please review and advise.

## 2021-07-15 NOTE — Telephone Encounter (Signed)
I will suggest she should try melatonin first to help her sleep.  If that did not help her then she can try trazodone only 25 mg as needed for insomnia.  Unfortunately sleep medication can make her more confused but she had a good response in the past and she can try again.  If her confusion got worse then she should to stop the trazodone immediately. I sent the script at her pharmacy.

## 2021-07-15 NOTE — Telephone Encounter (Signed)
Spoke with pt's husband [name] who verbalizes understanding. Encouraged to call with any questions or concerns.

## 2021-07-19 ENCOUNTER — Other Ambulatory Visit: Payer: Self-pay | Admitting: *Deleted

## 2021-07-19 DIAGNOSIS — I878 Other specified disorders of veins: Secondary | ICD-10-CM

## 2021-07-20 ENCOUNTER — Encounter (HOSPITAL_COMMUNITY): Payer: Self-pay

## 2021-07-20 ENCOUNTER — Emergency Department (HOSPITAL_COMMUNITY)
Admission: EM | Admit: 2021-07-20 | Discharge: 2021-07-20 | Disposition: A | Discharge status: Home / Self Care | Payer: BC Managed Care – PPO | Attending: Emergency Medicine | Admitting: Emergency Medicine

## 2021-07-20 ENCOUNTER — Emergency Department (HOSPITAL_COMMUNITY): Payer: BC Managed Care – PPO

## 2021-07-20 ENCOUNTER — Other Ambulatory Visit: Payer: Self-pay

## 2021-07-20 DIAGNOSIS — R32 Unspecified urinary incontinence: Secondary | ICD-10-CM | POA: Diagnosis not present

## 2021-07-20 DIAGNOSIS — R739 Hyperglycemia, unspecified: Secondary | ICD-10-CM | POA: Diagnosis not present

## 2021-07-20 DIAGNOSIS — R300 Dysuria: Secondary | ICD-10-CM | POA: Diagnosis not present

## 2021-07-20 DIAGNOSIS — R410 Disorientation, unspecified: Secondary | ICD-10-CM | POA: Diagnosis not present

## 2021-07-20 DIAGNOSIS — R519 Headache, unspecified: Secondary | ICD-10-CM | POA: Diagnosis not present

## 2021-07-20 DIAGNOSIS — R350 Frequency of micturition: Secondary | ICD-10-CM | POA: Diagnosis not present

## 2021-07-20 DIAGNOSIS — I959 Hypotension, unspecified: Secondary | ICD-10-CM | POA: Diagnosis not present

## 2021-07-20 DIAGNOSIS — Z7901 Long term (current) use of anticoagulants: Secondary | ICD-10-CM | POA: Insufficient documentation

## 2021-07-20 LAB — CBC WITH DIFFERENTIAL/PLATELET
Abs Immature Granulocytes: 0.28 10*3/uL — ABNORMAL HIGH (ref 0.00–0.07)
Basophils Absolute: 0.1 10*3/uL (ref 0.0–0.1)
Basophils Relative: 1 %
Eosinophils Absolute: 0 10*3/uL (ref 0.0–0.5)
Eosinophils Relative: 0 %
HCT: 36.1 % (ref 36.0–46.0)
Hemoglobin: 11.8 g/dL — ABNORMAL LOW (ref 12.0–15.0)
Immature Granulocytes: 6 %
Lymphocytes Relative: 8 %
Lymphs Abs: 0.4 10*3/uL — ABNORMAL LOW (ref 0.7–4.0)
MCH: 33.5 pg (ref 26.0–34.0)
MCHC: 32.7 g/dL (ref 30.0–36.0)
MCV: 102.6 fL — ABNORMAL HIGH (ref 80.0–100.0)
Monocytes Absolute: 0.4 10*3/uL (ref 0.1–1.0)
Monocytes Relative: 8 %
Neutro Abs: 3.7 10*3/uL (ref 1.7–7.7)
Neutrophils Relative %: 77 %
Platelets: 222 10*3/uL (ref 150–400)
RBC: 3.52 MIL/uL — ABNORMAL LOW (ref 3.87–5.11)
RDW: 14.8 % (ref 11.5–15.5)
WBC: 4.9 10*3/uL (ref 4.0–10.5)
nRBC: 0 % (ref 0.0–0.2)

## 2021-07-20 LAB — URINALYSIS, ROUTINE W REFLEX MICROSCOPIC
Bilirubin Urine: NEGATIVE
Glucose, UA: NEGATIVE mg/dL
Ketones, ur: 5 mg/dL — AB
Leukocytes,Ua: NEGATIVE
Nitrite: NEGATIVE
Protein, ur: NEGATIVE mg/dL
Specific Gravity, Urine: 1.021 (ref 1.005–1.030)
pH: 5 (ref 5.0–8.0)

## 2021-07-20 LAB — BASIC METABOLIC PANEL
Anion gap: 8 (ref 5–15)
BUN: 32 mg/dL — ABNORMAL HIGH (ref 6–20)
CO2: 27 mmol/L (ref 22–32)
Calcium: 8.4 mg/dL — ABNORMAL LOW (ref 8.9–10.3)
Chloride: 102 mmol/L (ref 98–111)
Creatinine, Ser: 0.43 mg/dL — ABNORMAL LOW (ref 0.44–1.00)
GFR, Estimated: >60 mL/min (ref >60)
Glucose, Bld: 120 mg/dL — ABNORMAL HIGH (ref 70–99)
Potassium: 3.9 mmol/L (ref 3.5–5.1)
Sodium: 137 mmol/L (ref 135–145)

## 2021-07-20 LAB — CBG MONITORING, ED: Glucose-Capillary: 123 mg/dL — ABNORMAL HIGH (ref 70–99)

## 2021-07-20 NOTE — ED Notes (Signed)
Patient and family education on foley cath and leg bag use. Denies any other needs or questions at this time  ?

## 2021-07-20 NOTE — ED Provider Notes (Addendum)
?[facility] DEPT ?Provider Note ? ? ?CSN: [id number] ?Arrival date & time: 07/20/21  1034 ? ?  ? ?History ? ?Chief Complaint  ?Patient presents with  ? Urinary Frequency  ? ? ?Melissa Case is a 61 y.o. female. ? ?61 year old female who presents with 2 days of urinary frequency.  Patient denies any abdominal or flank discomfort.  No fever or chills.  Is concerned this may have a UTI.  She is not diabetic.  Denies any poly dip Sia.  Called EMS and transported here ? ? ?  ? ?Home Medications ?Prior to Admission medications   ?Medication Sig Start Date End Date Taking? Authorizing Provider  ?dexamethasone (DECADRON) 4 MG tablet Take 1 tablet (4 mg total) by mouth every 12 (twelve) hours. ?Patient taking differently: Take 2 mg by mouth 3 (three) times daily. 05/14/21   Gifford Shave, MD  ?divalproex (DEPAKOTE) 250 MG DR tablet Take 1 tablet (250 mg total) by mouth 2 (two) times daily. (In addition to 500mg  twice daily for a total dosage of 750mg  BID). 04/19/21   Alric Ran, MD  ?divalproex (DEPAKOTE) 500 MG DR tablet Take 1 tablet by mouth twice daily 07/11/21   Alric Ran, MD  ?docusate sodium (COLACE) 100 MG capsule Take 1 capsule (100 mg total) by mouth daily as needed for up to 30 doses for mild constipation. To prevent constipation while taking oxycodone ?Patient not taking: Reported on 07/12/2021 06/20/21   Wyvonnia Dusky, MD  ?Arne Cleveland 5 MG TABS tablet Take 1 tablet by mouth twice daily 07/11/21   Camillia Herter, NP  ?esomeprazole (NEXIUM) 40 MG capsule Take 1 capsule by mouth once daily 05/03/21   Thornton Park, MD  ?furosemide (LASIX) 20 MG tablet Take 2 tablets (40 mg total) by mouth daily for 5 days. 06/13/21 06/18/21  Deno Etienne, DO  ?furosemide (LASIX) 40 MG tablet Take 1 tablet (40 mg total) by mouth daily for 14 doses. 06/20/21 07/04/21  Wyvonnia Dusky, MD  ?gentamicin cream (GARAMYCIN) 0.1 % Apply 1 application topically at bedtime. 06/24/21   [provider]  ?levETIRAcetam (KEPPRA) 500 MG tablet Take 500 mg by mouth 2 (two) times daily.    [provider]  ?Nystatin (GERHARDT'S BUTT CREAM) CREA Apply 1 application topically 3 (three) times daily. ?Patient not taking: Reported on 07/12/2021 05/14/21   Gifford Shave, MD  ?ondansetron (ZOFRAN-ODT) 4 MG disintegrating tablet Take 4 mg by mouth every 8 (eight) hours as needed for nausea or vomiting.    [provider]  ?oxyCODONE (ROXICODONE) 5 MG immediate release tablet Take 1 tablet (5 mg total) by mouth every 6 (six) hours as needed for up to 15 doses for severe pain. 06/20/21   Wyvonnia Dusky, MD  ?PARoxetine (PAXIL) 40 MG tablet Take 1 tablet (40 mg total) by mouth daily. 07/01/21   Arfeen, Arlyce Harman, MD  ?traZODone (DESYREL) 50 MG tablet Take 0.5 tablets (25 mg total) by mouth at bedtime. 07/15/21   Kathlee Nations, MD  ?   ? ?Allergies    ?Morphine and Penicillins   ? ?Review of Systems   ?Review of Systems  ?All other systems reviewed and are negative. ? ?Physical Exam ?Updated Vital Signs ?There were no vitals taken for this visit. ?Physical Exam ?Vitals and nursing note reviewed.  ?Constitutional:   ?   General: She is not in acute distress. ?   Appearance: Normal appearance. She is well-developed. She is not toxic-appearing.  ?HENT:  ?  Head: Normocephalic and atraumatic.  ?Eyes:  ?   General: Lids are normal.  ?   Conjunctiva/sclera: Conjunctivae normal.  ?   Pupils: Pupils are equal, round, and reactive to light.  ?Neck:  ?   Thyroid: No thyroid mass.  ?   Trachea: No tracheal deviation.  ?Cardiovascular:  ?   Rate and Rhythm: Normal rate and regular rhythm.  ?   Heart sounds: Normal heart sounds. No murmur heard. ?  No gallop.  ?Pulmonary:  ?   Effort: Pulmonary effort is normal. No respiratory distress.  ?   Breath sounds: Normal breath sounds. No stridor. No decreased breath sounds, wheezing, rhonchi or rales.  ?Abdominal:  ?   General: There is no distension.  ?   Palpations: Abdomen is  soft.  ?   Tenderness: There is no abdominal tenderness. There is no rebound.  ?Musculoskeletal:     ?   General: No tenderness. Normal range of motion.  ?   Cervical back: Normal range of motion and neck supple.  ?Skin: ?   General: Skin is warm and dry.  ?   Findings: No abrasion or rash.  ?Neurological:  ?   Mental Status: She is alert and oriented to person, place, and time. Mental status is at baseline.  ?   GCS: GCS eye subscore is 4. GCS verbal subscore is 5. GCS motor subscore is 6.  ?   Cranial Nerves: No cranial nerve deficit.  ?   Sensory: No sensory deficit.  ?   Motor: Motor function is intact.  ?   Comments: At baseline per patient  ?Psychiatric:     ?   Attention and Perception: Attention normal.     ?   Speech: Speech normal.     ?   Behavior: Behavior normal.  ? ? ?ED Results / Procedures / Treatments   ?Labs ?(all labs ordered are listed, but only abnormal results are displayed) ?Labs Reviewed  ?CBC WITH DIFFERENTIAL/PLATELET  ?BASIC METABOLIC PANEL  ?URINALYSIS, ROUTINE W REFLEX MICROSCOPIC  ?CBG MONITORING, ED  ? ? ?EKG ?None ? ?Radiology ?No results found. ? ?Procedures ?Procedures  ? ? ?Medications Ordered in ED ?Medications - No data to display ? ?ED Course/ Medical Decision Making/ A&P ?  ?                        ?Medical Decision Making ?Amount and/or Complexity of Data Reviewed ?Labs: ordered. ?Radiology: ordered. ? ? ?Patient presented with urinary frequency has no evidence of UTI here.  Concern for possible undiagnosed diabetes and blood sugar was appropriate.  No leukocytosis on her CBC.  Patient follow-up with her doctor as needed ? ?2:46 PM ?At time of discharge, patient's husband present and states that patient has had periods of confusion here.  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  Head CT performed here which did not show any new findings here.  Foley catheter placed for patient's urinary incontinence.  She has no new weakness to her lower  extremities.  Doubt spinal cord etiology.  Patient be given referral to neurology on-call and sent home with Foley catheter ? ? ? ? ? ? ? ?Final Clinical Impression(s) / ED Diagnoses ?Final diagnoses:  ?None  ? ? ?Rx / DC Orders ?ED Discharge Orders   ? ? None  ? ?  ? ? ?  ?Lacretia Leigh, MD ?07/20/21 1237 ? ?  ?Lacretia Leigh, MD ?07/20/21 1447 ? ?

## 2021-07-20 NOTE — ED Triage Notes (Signed)
Patient brought in via ems from home. Patient c/o urinary frequency for a few days.  ?

## 2021-07-24 ENCOUNTER — Ambulatory Visit
Admission: EM | Admit: 2021-07-24 | Discharge: 2021-07-24 | Disposition: A | Discharge status: Home / Self Care | Payer: BC Managed Care – PPO

## 2021-07-24 ENCOUNTER — Other Ambulatory Visit: Payer: Self-pay

## 2021-07-24 DIAGNOSIS — Z978 Presence of other specified devices: Secondary | ICD-10-CM

## 2021-07-24 NOTE — ED Provider Notes (Signed)
RN had significant conversation with patient- no indication for further labs or work up at this time- she is to see her PCP tomorrow. She has appointment with urology in 2 weeks.  ?  ?Francene Finders, PA-C ?07/24/21 [phone] ? ?

## 2021-07-25 ENCOUNTER — Ambulatory Visit
Admission: RE | Admit: 2021-07-25 | Discharge: 2021-07-25 | Disposition: A | Discharge status: Home / Self Care | Payer: BC Managed Care – PPO | Source: Ambulatory Visit | Attending: Student | Admitting: Student

## 2021-07-25 DIAGNOSIS — R9389 Abnormal findings on diagnostic imaging of other specified body structures: Secondary | ICD-10-CM

## 2021-07-27 ENCOUNTER — Encounter (HOSPITAL_COMMUNITY): Payer: Self-pay | Admitting: *Deleted

## 2021-07-27 ENCOUNTER — Other Ambulatory Visit: Payer: Self-pay

## 2021-07-27 ENCOUNTER — Emergency Department (HOSPITAL_COMMUNITY)
Admission: EM | Admit: 2021-07-27 | Discharge: 2021-07-27 | Disposition: A | Discharge status: Home / Self Care | Payer: BC Managed Care – PPO | Attending: Emergency Medicine | Admitting: Emergency Medicine

## 2021-07-27 DIAGNOSIS — R600 Localized edema: Secondary | ICD-10-CM

## 2021-07-27 DIAGNOSIS — R609 Edema, unspecified: Secondary | ICD-10-CM | POA: Insufficient documentation

## 2021-07-27 DIAGNOSIS — Z7901 Long term (current) use of anticoagulants: Secondary | ICD-10-CM | POA: Insufficient documentation

## 2021-07-27 MED ORDER — FUROSEMIDE 40 MG PO TABS: 40.0000 mg | ORAL_TABLET | Freq: Every day | ORAL | 0 refills | Status: DC

## 2021-07-27 NOTE — Discharge Instructions (Signed)
Begin taking Lasix as prescribed. ? ?Follow-up with primary doctor later this week, and return to the ER if you develop increased swelling, redness or pus draining, severe pain, or other new and concerning symptoms. ?

## 2021-07-27 NOTE — ED Triage Notes (Signed)
Pt reporting a small area on the left lower abdomen that is draining. Small area with clear fluid leaving.  ?

## 2021-07-27 NOTE — ED Provider Notes (Signed)
[facility] DEPT Provider Note   CSN: [id number] Arrival date & time: 07/27/21  0243     History  No chief complaint on file.   Melissa Case is a 61 y.o. female.  Patient is a 61 year old female with past medical history of small bowel obstruction, abdominal adhesions, prior pulmonary embolism.  Patient presenting today with complaints of weeping fluid from her abdominal wall.  Husband has noted an area to her left lower abdomen where there is clear fluid weeping through the skin and saturating her clothing.  Patient denies that she is having any discomfort there.  There is no fever or chills.  Patient recently seen for unrelated issues and had a CT scan of the abdomen and pelvis 2 weeks ago showing no acute process or bladder abnormality.  The history is provided by the patient.      Home Medications Prior to Admission medications   Medication Sig Start Date End Date Taking? Authorizing Provider  dexamethasone (DECADRON) 4 MG tablet Take 1 tablet (4 mg total) by mouth every 12 (twelve) hours. Patient taking differently: Take 2 mg by mouth 3 (three) times daily. 05/14/21   Gifford Shave, MD  divalproex (DEPAKOTE) 250 MG DR tablet Take 1 tablet (250 mg total) by mouth 2 (two) times daily. (In addition to '500mg'$  twice daily for a total dosage of '750mg'$  BID). 04/19/21   Alric Ran, MD  divalproex (DEPAKOTE) 500 MG DR tablet Take 1 tablet by mouth twice daily 07/11/21   Alric Ran, MD  docusate sodium (COLACE) 100 MG capsule Take 1 capsule (100 mg total) by mouth daily as needed for up to 30 doses for mild constipation. To prevent constipation while taking oxycodone Patient not taking: Reported on 07/12/2021 06/20/21   Wyvonnia Dusky, MD  ELIQUIS 5 MG TABS tablet Take 1 tablet by mouth twice daily 07/11/21   Camillia Herter, NP  esomeprazole (NEXIUM) 40 MG capsule Take 1 capsule by mouth once daily 05/03/21   Thornton Park, MD  furosemide (LASIX)  20 MG tablet Take 2 tablets (40 mg total) by mouth daily for 5 days. 06/13/21 06/18/21  Deno Etienne, DO  furosemide (LASIX) 40 MG tablet Take 1 tablet (40 mg total) by mouth daily for 14 doses. 06/20/21 07/04/21  Wyvonnia Dusky, MD  gentamicin cream (GARAMYCIN) 0.1 % Apply 1 application topically at bedtime. 06/24/21   [provider]  levETIRAcetam (KEPPRA) 500 MG tablet Take 500 mg by mouth 2 (two) times daily.    [provider]  Nystatin (GERHARDT'S BUTT CREAM) CREA Apply 1 application topically 3 (three) times daily. Patient not taking: Reported on 07/12/2021 05/14/21   Gifford Shave, MD  ondansetron (ZOFRAN-ODT) 4 MG disintegrating tablet Take 4 mg by mouth every 8 (eight) hours as needed for nausea or vomiting.    [provider]  oxyCODONE (ROXICODONE) 5 MG immediate release tablet Take 1 tablet (5 mg total) by mouth every 6 (six) hours as needed for up to 15 doses for severe pain. 06/20/21   Wyvonnia Dusky, MD  PARoxetine (PAXIL) 40 MG tablet Take 1 tablet (40 mg total) by mouth daily. 07/01/21   Arfeen, Arlyce Harman, MD  traZODone (DESYREL) 50 MG tablet Take 0.5 tablets (25 mg total) by mouth at bedtime. 07/15/21   Arfeen, Arlyce Harman, MD      Allergies    Morphine and Penicillins    Review of Systems   Review of Systems  All other systems reviewed and  are negative.  Physical Exam Updated Vital Signs BP (!) 151/74    Pulse 79    Temp 97.6 F (36.4 C) (Oral)    Resp 20    SpO2 96%  Physical Exam Vitals and nursing note reviewed.  Constitutional:      General: She is not in acute distress.    Appearance: She is well-developed. She is not diaphoretic.  HENT:     Head: Normocephalic and atraumatic.  Cardiovascular:     Rate and Rhythm: Normal rate and regular rhythm.     Heart sounds: No murmur heard.   No friction rub. No gallop.  Pulmonary:     Effort: Pulmonary effort is normal. No respiratory distress.     Breath sounds: Normal breath sounds. No wheezing.   Abdominal:     General: Bowel sounds are normal. There is no distension.     Palpations: Abdomen is soft.     Tenderness: There is no abdominal tenderness.     Comments: To the lower abdominal wall on the left side, there is a small, punctate area in the skin where clear fluid can be expressed.  There is no palpable abscess or fluctuance.  There is no erythema or warmth overlying the skin.  The area is nontender.  Musculoskeletal:        General: Normal range of motion.     Cervical back: Normal range of motion and neck supple.  Skin:    General: Skin is warm and dry.  Neurological:     General: No focal deficit present.     Mental Status: She is alert and oriented to person, place, and time.    ED Results / Procedures / Treatments   Labs (all labs ordered are listed, but only abnormal results are displayed) Labs Reviewed - No data to display  EKG None  Radiology CT Chest Wo Contrast  Result Date: 07/25/2021 CLINICAL DATA:  History of pulmonary embolism infiltrates in the lung fields EXAM: CT CHEST WITHOUT CONTRAST TECHNIQUE: Multidetector CT imaging of the chest was performed following the standard protocol without IV contrast. RADIATION DOSE REDUCTION: This exam was performed according to the departmental dose-optimization program which includes automated exposure control, adjustment of the mA and/or kV according to patient size and/or use of iterative reconstruction technique. COMPARISON:  05/10/2021 FINDINGS: Cardiovascular: There are scattered coronary artery calcifications. Small pericardial effusion is seen. Mediastinum/Nodes: No new significant lymphadenopathy seen. There is inhomogeneous attenuation in the thyroid with multiple nodules largest in the right lobe measuring approximally 2.2 cm with coarse calcification. This finding has not changed significantly. Lungs/Pleura: There is interval clearing of patchy infiltrates in both lungs. Residual linear densities seen in the  anterior aspects of both apices, more so on the right side. There are linear densities in the lingula and right middle lobe. There is 2.2 cm pleural-based nodular density with air-fluid level in the posterior right lower lung fields. There is a interval cavitation within this lesion. There is no pleural effusion or pneumothorax. Upper Abdomen: There is fatty infiltration in the liver. Surgical clips are seen in gallbladder fossa. Musculoskeletal: Unremarkable. IMPRESSION: There is interval decrease in multiple infiltrates in both lungs suggesting resolving multifocal pneumonia. There is 2.2 cm pleural-based smooth marginated lesion in the posterior right lower lobe with air-fluid level. This may suggest necrosis within focal pneumonia or necrosis within pulmonary infarction or infected bulla. There is no pleural effusion or pneumothorax. There are small linear patchy infiltrates in the apical portions of both  upper lobes medial segment of right middle lobe and in the lingula suggesting scarring or subsegmental atelectasis/pneumonia. Follow-up CT in 2-3 months may be considered to assess resolution. Electronically Signed   By: Elmer Picker M.D.   On: 07/25/2021 15:43    Procedures Procedures    Medications Ordered in ED Medications - No data to display  ED Course/ Medical Decision Making/ A&P  Patient brought by husband for evaluation of weeping from the abdominal wall as described in the HPI.  There is a small area to the left lower abdomen where clear fluid is seeping through the skin.  I feel nothing I see nothing that indicates an abscess or infection.  The drainage is not purulent or bloody.  I suspect the patient has some edema to the abdominal wall and this is edematous fluid seeping through.  She had a CT scan performed 2 weeks ago showing no abnormality with the bladder.  I highly doubt a bladder fistula has formed in the past 2 weeks.  I feel as though patient can safely be discharged  with local dressing changes and follow-up with primary doctor.  I will prescribe Lasix as patient does have edema to both lower extremities and extending into the abdomen and see if this helps resolve the fluid leaking issue.  Final Clinical Impression(s) / ED Diagnoses Final diagnoses:  None    Rx / DC Orders ED Discharge Orders     None         Veryl Speak, MD 07/27/21 [id number]

## 2021-07-29 ENCOUNTER — Encounter: Payer: Self-pay | Admitting: Surgery

## 2021-07-29 ENCOUNTER — Ambulatory Visit: Payer: BC Managed Care – PPO | Admitting: Surgery

## 2021-07-29 ENCOUNTER — Ambulatory Visit (HOSPITAL_COMMUNITY)
Admission: RE | Admit: 2021-07-29 | Discharge: 2021-07-29 | Disposition: A | Discharge status: Home / Self Care | Payer: BC Managed Care – PPO | Source: Ambulatory Visit | Attending: Surgery | Admitting: Surgery

## 2021-07-29 ENCOUNTER — Other Ambulatory Visit: Payer: Self-pay

## 2021-07-29 VITALS — BP 164/101 | HR 89 | Temp 98.0°F | Resp 20 | Ht 64.0 in | Wt 175.0 lb

## 2021-07-29 DIAGNOSIS — I878 Other specified disorders of veins: Secondary | ICD-10-CM | POA: Insufficient documentation

## 2021-07-29 DIAGNOSIS — I82433 Acute embolism and thrombosis of popliteal vein, bilateral: Secondary | ICD-10-CM | POA: Diagnosis not present

## 2021-07-29 DIAGNOSIS — M7989 Other specified soft tissue disorders: Secondary | ICD-10-CM

## 2021-07-29 NOTE — Progress Notes (Signed)
[facility]  Patient name: Melissa Case MRN: [id number] DOB: 03-31-1961 Sex: female   REQUESTING PROVIDER:    Pati Gallo   REASON FOR CONSULT:    Leg swelling  HISTORY OF PRESENT ILLNESS:   Melissa Case is a 61 y.o. female, who is referred for evaluation of leg swelling.  Patient was in the hospital in late 2022 with DVT and PE.  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  She was started on anticoagulation.  She did not require interventional treatment of her PE.  She continues to have leg swelling.  She is not wearing compression socks.  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  She is a former smoker.  She has a history of seizures.  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   Panic attack    Seizures ([place])      FAMILY HISTORY   Family History  Problem Relation Age of Onset   Depression Sister    Heart disease Mother    Diabetes Father    Breast cancer Paternal Grandmother    Colon cancer Neg Hx    Pancreatic cancer Neg Hx    Esophageal cancer Neg Hx    Stomach cancer Neg Hx    Rectal cancer Neg Hx    Liver cancer Neg Hx     SOCIAL HISTORY:   Social History   Socioeconomic History   Marital status: Married    Spouse name: Richard   Number of children: 4   Years of education: Not on file   Highest education level: Not on file  Occupational History   Not on file  Tobacco Use   Smoking status: Former    Packs/day: 0.50    Years: 42.00    Pack years: 21.00    Types: Cigarettes    Start date: 68    Quit date: 01/16/2021    Years since quitting: 0.5   Smokeless tobacco: Never  Vaping Use   Vaping Use:  Every day   Start date: 01/16/2021   Substances: Nicotine  Substance and Sexual Activity   Alcohol use: No    Alcohol/week: 0.0 standard drinks   Drug use: No   Sexual activity: Yes    Birth control/protection: None  Other Topics Concern   Not on file  Social History Narrative   Lives with husband   Right Handed   Drinks 6-7 cups caffeine daily   Social Determinants of Health   Financial Resource Strain: Not on file  Food Insecurity: Not on file  Transportation Needs: Not on file  Physical Activity: Not on file  Stress: Not on file  Social Connections: Not on file  Intimate Partner Violence: Not on file    ALLERGIES:    Allergies  Allergen Reactions   Morphine Itching   Penicillins Rash    Has patient had a PCN reaction causing immediate rash, facial/tongue/throat swelling, SOB or lightheadedness with hypotension: No Has patient had a PCN reaction causing severe rash involving mucus membranes or skin necrosis: No Has patient had a PCN reaction that required hospitalization No Has patient had a PCN reaction occurring within the last 10 years: No If  all of the above answers are "NO", then may proceed with Cephalosporin use.     CURRENT MEDICATIONS:    Current Outpatient Medications  Medication Sig Dispense Refill   dexamethasone (DECADRON) 4 MG tablet Take 1 tablet (4 mg total) by mouth every 12 (twelve) hours. (Patient taking differently: Take 2 mg by mouth 3 (three) times daily.) 60 tablet 0   divalproex (DEPAKOTE) 250 MG DR tablet Take 1 tablet (250 mg total) by mouth 2 (two) times daily. (In addition to '500mg'$  twice daily for a total dosage of '750mg'$  BID). 60 tablet 3   divalproex (DEPAKOTE) 500 MG DR tablet Take 1 tablet by mouth twice daily 60 tablet 0   docusate sodium (COLACE) 100 MG capsule Take 1 capsule (100 mg total) by mouth daily as needed for up to 30 doses for mild constipation. To prevent constipation while taking oxycodone 30 capsule 0   ELIQUIS 5 MG  TABS tablet Take 1 tablet by mouth twice daily 60 tablet 1   esomeprazole (NEXIUM) 40 MG capsule Take 1 capsule by mouth once daily 30 capsule 0   furosemide (LASIX) 40 MG tablet Take 1 tablet (40 mg total) by mouth daily. 30 tablet 0   gentamicin cream (GARAMYCIN) 0.1 % Apply 1 application topically at bedtime.     levETIRAcetam (KEPPRA) 500 MG tablet Take 500 mg by mouth 2 (two) times daily.     Nystatin (GERHARDT'S BUTT CREAM) CREA Apply 1 application topically 3 (three) times daily.     ondansetron (ZOFRAN-ODT) 4 MG disintegrating tablet Take 4 mg by mouth every 8 (eight) hours as needed for nausea or vomiting.     oxyCODONE (ROXICODONE) 5 MG immediate release tablet Take 1 tablet (5 mg total) by mouth every 6 (six) hours as needed for up to 15 doses for severe pain. 15 tablet 0   PARoxetine (PAXIL) 40 MG tablet Take 1 tablet (40 mg total) by mouth daily. 90 tablet 0   traZODone (DESYREL) 50 MG tablet Take 0.5 tablets (25 mg total) by mouth at bedtime. 15 tablet 0   No current facility-administered medications for this visit.    REVIEW OF SYSTEMS:   '[X]'$  denotes positive finding, '[ ]'$  denotes negative finding Cardiac  Comments:  Chest pain or chest pressure:    Shortness of breath upon exertion:    Short of breath when lying flat:    Irregular heart rhythm:        Vascular    Pain in calf, thigh, or hip brought on by ambulation:    Pain in feet at night that wakes you up from your sleep:     Blood clot in your veins:    Leg swelling:  x       Pulmonary    Oxygen at home:    Productive cough:     Wheezing:         Neurologic    Sudden weakness in arms or legs:     Sudden numbness in arms or legs:     Sudden onset of difficulty speaking or slurred speech:    Temporary loss of vision in one eye:     Problems with dizziness:         Gastrointestinal    Blood in stool:      Vomited blood:         Genitourinary    Burning when urinating:     Blood in urine:         Psychiatric  Major depression:         Hematologic    Bleeding problems:    Problems with blood clotting too easily:        Skin    Rashes or ulcers:        Constitutional    Fever or chills:     PHYSICAL EXAM:   Vitals:   07/29/21 1207  BP: (!) 164/101  Pulse: 89  Resp: 20  Temp: 98 F (36.7 C)  SpO2: (!) 89%  Weight: 175 lb (79.4 kg)  Height: '5\' 4"'$  (1.626 m)    GENERAL: The patient is a well-nourished female, in no acute distress. The vital signs are documented above. CARDIAC: There is a regular rate and rhythm.  VASCULAR: Bilateral lower extremity edema PULMONARY: Nonlabored respirations MUSCULOSKELETAL: There are no major deformities or cyanosis. NEUROLOGIC: No focal weakness or paresthesias are detected. SKIN: There are no ulcers or rashes noted. PSYCHIATRIC: The patient has a normal affect.  STUDIES:   I have reviewed the following: Venous Reflux Times  +------------------+---------+------+-----------+------------+-------------  ----+   RIGHT              Reflux No Reflux Reflux Time Diameter cms Comments                                            Yes                                                 +------------------+---------+------+-----------+------------+-------------  ----+   CFV                           yes    >1 second                                    +------------------+---------+------+-----------+------------+-------------  ----+   FV mid             no                                                             +------------------+---------+------+-----------+------------+-------------  ----+   FV dist                                                      unable to                                                                           visualize             +------------------+---------+------+-----------+------------+-------------  ----+  Popliteal          no                                        chronic  thrombus     +------------------+---------+------+-----------+------------+-------------  ----+   GSV at [facility]         no                               0.48                          +------------------+---------+------+-----------+------------+-------------  ----+   GSV prox thigh     no                               0.27                          +------------------+---------+------+-----------+------------+-------------  ----+   GSV mid thigh      no                               0.21                          +------------------+---------+------+-----------+------------+-------------  ----+   GSV dist thigh                yes     >500 ms       0.19     out of  fascia       +------------------+---------+------+-----------+------------+-------------  ----+   GSV at knee        no                               0.20     out of  fascia       +------------------+---------+------+-----------+------------+-------------  ----+   GSV prox calf                 yes     >500 ms       0.21     out of  fascia       +------------------+---------+------+-----------+------------+-------------  ----+   GSV mid calf                                                 returns to  fascia   +------------------+---------+------+-----------+------------+-------------  ----+   SSV Pop Fossa      no                               0.25                          +------------------+---------+------+-----------+------------+-------------  ----+   SSV prox calf      no  0.21                          +------------------+---------+------+-----------+------------+-------------  ----+   SSV mid calf       no                               0.17                          +------------------+---------+------+-----------+------------+-------------  ----+   Mid calf                      yes     >500 ms                                      perforator                                                                         +------------------+---------+------+-----------+------------+-------------  ----+  ASSESSMENT and PLAN   Leg swelling: The patient likely has lymphedema contributing to the majority of her swelling however she does also have bilateral DVT which is also contributory.  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  This is presumably the source of her PE.  Etiology for this is unclear therefore duration of anticoagulation should be determined by hematology.  She had venous reflux testing today that did not show any significant venous reflux.  She is not a candidate for laser ablation of the saphenous vein.  Therefore, I would recommend leg elevation and daily use of compression socks to help with her swelling.  In addition, I am referring her to lymphedema therapy to see if they can help with some of her issues.  She will follow-up with me on an as-needed basis.   Leia Alf, MD, FACS [facility] [phone] Pager [phone]

## 2021-07-30 ENCOUNTER — Other Ambulatory Visit: Payer: Self-pay | Admitting: Neurology

## 2021-08-01 ENCOUNTER — Ambulatory Visit: Payer: No Typology Code available for payment source | Admitting: Neurology

## 2021-08-01 ENCOUNTER — Other Ambulatory Visit: Payer: Self-pay | Admitting: Gastroenterology

## 2021-08-01 DIAGNOSIS — K2270 Barrett's esophagus without dysplasia: Secondary | ICD-10-CM

## 2021-08-02 DIAGNOSIS — R350 Frequency of micturition: Secondary | ICD-10-CM | POA: Diagnosis not present

## 2021-08-08 ENCOUNTER — Ambulatory Visit: Payer: BC Managed Care – PPO | Admitting: Neurology

## 2021-08-08 ENCOUNTER — Encounter: Payer: Self-pay | Admitting: Neurology

## 2021-08-08 ENCOUNTER — Other Ambulatory Visit: Payer: Self-pay

## 2021-08-08 VITALS — BP 157/80 | HR 91 | Ht 64.0 in | Wt 232.0 lb

## 2021-08-08 DIAGNOSIS — G936 Cerebral edema: Secondary | ICD-10-CM | POA: Diagnosis not present

## 2021-08-08 DIAGNOSIS — R569 Unspecified convulsions: Secondary | ICD-10-CM

## 2021-08-08 DIAGNOSIS — G9389 Other specified disorders of brain: Secondary | ICD-10-CM | POA: Diagnosis not present

## 2021-08-08 DIAGNOSIS — R609 Edema, unspecified: Secondary | ICD-10-CM

## 2021-08-08 DIAGNOSIS — R5381 Other malaise: Secondary | ICD-10-CM

## 2021-08-08 DIAGNOSIS — G510 Bell's palsy: Secondary | ICD-10-CM | POA: Diagnosis not present

## 2021-08-08 MED ORDER — DIVALPROEX SODIUM 250 MG PO DR TAB: 250.0000 mg | DELAYED_RELEASE_TABLET | Freq: Two times a day (BID) | ORAL | 6 refills | Status: AC

## 2021-08-08 MED ORDER — LEVETIRACETAM 500 MG PO TABS: 500.0000 mg | ORAL_TABLET | Freq: Two times a day (BID) | ORAL | 6 refills | Status: AC

## 2021-08-08 MED ORDER — DIVALPROEX SODIUM 500 MG PO DR TAB: 500.0000 mg | DELAYED_RELEASE_TABLET | Freq: Two times a day (BID) | ORAL | 6 refills | Status: AC

## 2021-08-08 NOTE — Patient Instructions (Signed)
Continue with Depakote 750 mg BID  ?Continue with Levetiracetam 500 mg BID, refills given  ?Continue with Steroids, Lasix and follow up with PCP and neurosurgery as scheduled  ?Contact me if she has breakthrough seizures ?Follow up in 6 months  ?

## 2021-08-08 NOTE — Progress Notes (Signed)
? ?[facility] ? ?PATIENT: Melissa Case ?DOB: [birth year] ? ?REFERRING CLINICIAN: Camillia Herter, NP ?HISTORY FROM: Patient and Husband  ?REASON FOR VISIT: Seizure  ? ? ?HISTORICAL ? ?CHIEF COMPLAINT:  ?Chief Complaint  ?Patient presents with  ? Follow-up  ?  Rm 12. Accompanied by husband, Melissa Case. ?Pt denies any new seizure activity. C/o disorientation.  ? ? ?INTERVAL HISTORY 08/08/2021:  ?Patient presents for follow up. She is accompanied by her husband. Since last visit, she is compliant with her medications, denies any side effects. No seizures but Husband does reports episodes of confusion. The last one being a few weeks ago where he noted that patient was doing dishes for more than 3 hrs. There was another example where she wanted to go the refrigerator to get a Soda but was confused about it. No abnormal movements noted.  ?She has been struggling with fluid overload, edema all the way to the abdomen. Has been seen in the ED twice, put on water pill and awaiting PMD visit. Today her weight is 232. ?She also follow up with Neurosurgery, her steroid have been halved to 2 mg TID and plan for further titration in the future.  ?Since last visit, she had started home PT but due to increase edema, it was discontinue because could not tolerate.  ? ? ?INTERVAL HISTORY 05/11/2021: ?Patient presents to follow-up with husband, last visit was November 7.  At that time plan was to discontinue Lacosamide, decrease Keppra to 500 mg twice daily and to start Depakote 500 mg twice daily due to daytime somnolence.  I have also started her on Ritalin.  Since that visit, husband said the Ritalin has not been helpful therefore he discontinued.  Husband also called on November 29, stating that Melissa Case has was found down, not sure if he she had a seizure or not, we had increased her valproic acid to 750 twice daily.  Since then, there is there is improvement in his daytime sleepiness, she is more awake and more  alert.  No reported additional seizures.  She is compliant with his medication. ?Brain MRI has been repeated and showing a stable to smaller right temporal lobe lesion and there is also decreased surrounding edema and associated mild mass-effect.  She is scheduled to see neurosurgeon next week. She is still on the steroid.  ? ? ?INTERVAL HISTORY 03/28/2021:  ?Patient presented for follow-up with her husband, last visit was on October 10, denies any additional seizures since last visit.  At that time I checked both for Keppra and lacosamide level and they were within normal limits.  Per husband she continued to have daytime somnolence therefore I discontinued the lacosamide and added Depakote 500 mg twice daily.  Patient continued to describe daytime somnolence.  Patient only gets up to eat and go back to sleep, she is more deconditioned and lately has been acting not like her normal self, stating that she wanted to pack and leave.  She continues on Decadron.  Again no seizures since last visit.  They have follow-up with neurosurgery next month. ? ? ?INTERVAL HISTORY 03/08/2021: ?Patient present today for follow-up with husband.  Last visit was October 5 at that time we increase the Keppra to 1000 mg twice daily due to recent seizure.  Since being home has been noted that patient has been sleeping all the time, she also complains of feeling hot all the time.  There are periods of confusion, she does not know where she is, husband described  no energy and she has been laying down all day to the point that she developed bedsores.  He is treating them with Neosporin.  Denies any additional seizures since last visit.  They contacted me yesterday with his new complaint therefore I brought him in today for further checkup and to obtain blood work.   ? ? ?INTERVAL HISTORY 02/23/2021:  ?Patient presents today for follow-up with her husband.  Since last visit she has presented to the ED for additional seizures, MRI done at that  time showed worsening of the edema despite dexamethasone.  She was also at that time on Keppra and phenytoin.  I discontinued the phenytoin (enzyme inducer) and started her on lacosamide 100 mg twice daily.  Since being on this current regimen husband has noted possible minor seizures due to the fact that she is confused time.  He reported the last major seizure was on October 2 when she was at work.  She was missing of work and was found an hour or hour and a half later wandering in the parking lot.  She presented to the ED where she had a head CT showing slight improvement of the edema.  A Keppra level was obtained and it was 10.4.  Lacosamide level was not obtained.  She reported compliance with her medication.  She has been working but on limited duty but due to recent seizure and wandering outside the parking lot, husband was asking if she can be out of work until her condition improves.  He denies any side effect from the medication.   ? ? ? 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  Patient stated she had a first lifetime seizure on June 7 of this year.  Per husband, on the day she was not acting like her normal self.  She was having trouble with her right hand.  Husband decided to call 911 and soon after he helped her to the floor because she seems like she was falling. While on the ground she had a generalized shaking episode lasting about a minute and followed by sleepiness and tiredness.  She was taken to [facility] where she was admitted for further workup.  ?Patient states that she does not remember the seizures and does not remember how she got in the hospital. She was having memory issues in the hospital. ?In the hospital she was on hooked up on EEG which showed subclinical seizures coming from the right temporoparietal  region.  She was started on levetiracetam and phenytoin.  ?  ?Patient also had a MRI brain which showed a 2.4 x 1.4 x 0.8 cm peripherally enhancing focus within the inferior right temporal lobe which was new compared to the previous study.  There was also moderate vasogenic edema within the right temporal lobe ?She was also seen by neurosurgery who recommended a repeat MRI in 2 to 3 months and she was also started on Decadron for vasogenic edema. ? ?Patient reported since leaving the hospital she has not had any additional seizures.  She is on Keppra 1000 mg twice a day and phenytoin 100 mg 3 times a day.  She reports side effects including increase daytime sleepiness, some nausea and also increase anxiety.  She feels like she has to do adjust to the medication and the new diagnosis.  Denies any mood swing denies  any dizziness, no falls ? ? ?Seizure Type:  Possible focal seizure ? ?Current frequency: Last seizure in October 2022. ? ?Seizure risk factors: parotid gland tumors s/p radiation, no family seizure history.  ? ?Previous ASMs: Levetiracetam, Phenytoin (interaction with steroids), Lacosamide (increase sleepiness)  ? ?Currenty ASMs: Levetiracetam, Depakote  ? ?Side effects: Sleepiness, nausea, anxiety  ? ?Brain images:  ?MRI Brain:  ?2.4 x 1.4 x 0.8 cm peripherally enhancing focus within the inferior right temporal lobe, new as compared to the brain MRI of 06/25/2019. Also new from this prior exam, there is moderate vasogenic edema within the right temporal lobe, extending to the right temporal stem. These findings may reflect necrosis and edema related to prior radiation therapy. However, a right temporal lobe metastasis cannot be excluded. Neuro-Oncology consultation is recommended. ?  ?Diffusion-weighted signal abnormality within the cortex of the anterior right temporal lobe, within the right hippocampus and within the medial right thalamus. Findings are nonspecific. Primary considerations are encephalitis  (including herpes encephalitis), seizure-related changes or acute ischemia. ?  ?Sequela of interval right parotid resection. Persistent asymmetric enhancement of the mastoid segment of the right facial nerve

## 2021-08-11 NOTE — Progress Notes (Signed)
? ?Synopsis: Referred for PE by Camillia Herter, NP ? ?Subjective:  ? ?PATIENT ID: Melissa Case GENDER: female DOB: 1960/06/01, MRN: [id number] ? ?Chief Complaint  ?Patient presents with  ? Follow-up  ?  Breathing is unchanged since the last visit.   ? 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 ? ?She had had several months of DOE, BLE swelling. Had not had fever, cough, CP. Has never had blood clot before. No one else in her family has had blood clots. No early pregnancy losses when she was younger. Was essentially bedridden for several months prior to discovery of clots when she was having difficulty with seizures. No bleeding issues on eliquis.  ? ?Went back to ED 06/13/21 for uncomfortable BLE swelling, given lasix and discharged home. ? ?No family history of lung cancer or lung disease. ? ?Smoked for 40 years 1ppd, quit 6 months ago. Still vapes nicotine products. She worked in a warehouse in the past.  ? ?Interval HPI: ?Had CT Chest 07/25/21 with improvement. About the same level of DOE as at last visit. No cough. She does have some orthopnea and tries to sleep on her side. She is wearing a vape pen necklace. ? ?Her weight has climbed dramatically since last visit. She and her husband disagree about whether or not her lasix has been helpful.  ? ?No bleeding issues since being on eliquis. No seizures since last visit but did fall down a couple times since last visit.  ? ?She is able to walk with a walker on her own. No improvement in mobility since last visit.  ? ?Otherwise pertinent review of systems is negative. ? ?Past Medical History:  ?Diagnosis Date  ? Cancer [facility])   ? Partoid  ? Depression   ? GERD (gastroesophageal reflux disease)   ? History of radiation to head and neck region 09/16/2019  ? History IMRT (09/16/19 -  10/27/19) for parotid tumor stage pT3, pN2b.  ? Panic attack   ? Seizures ([place])   ?  ? ?Family History  ?Problem Relation Age of Onset  ? Depression Sister   ? Heart disease Mother   ? Diabetes Father   ? Breast cancer Paternal Grandmother   ? Colon cancer Neg Hx   ? Pancreatic cancer Neg Hx   ? Esophageal cancer Neg Hx   ? Stomach cancer Neg Hx   ? Rectal cancer Neg Hx   ? Liver cancer Neg Hx   ?  ? ?Past Surgical History:  ?Procedure Laterality Date  ? ABDOMINAL HYSTERECTOMY    ? CHOLECYSTECTOMY N/A 11/21/2017  ? Procedure: LAPAROSCOPIC CHOLECYSTECTOMY WITH INTRAOPERATIVE CHOLANGIOGRAM;  Surgeon: Jovita Kussmaul, MD;  Location: [facility];  Service: General;  Laterality: N/A;  ? MULTIPLE EXTRACTIONS WITH ALVEOLOPLASTY N/A 08/28/2019  ? Procedure: Extraction of tooth #'s 3-7, 10-15, and 20-31 with alveoloplatsy, maxillary right buccal exostosis reduction, and bilateral mandibular tori reductions.;  Surgeon: Lenn Cal, DDS;  Location: [place];  Service: Oral Surgery;  Laterality: N/A;  ? PAROTIDECTOMY Right 07/22/2019  ? with biopsy; done at [facility] by Dr. Fredricka Bonine  ? ? ?Social History  ? ?Socioeconomic History  ? Marital status: Married  ?  Spouse name: [name]  ? Number of children: 4  ? Years of education: Not on file  ? Highest education level: Not on file  ?Occupational History  ?  Not on file  ?Tobacco Use  ? Smoking status: Former  ?  Packs/day: 0.50  ?  Years: 42.00  ?  Pack years: 21.00  ?  Types: Cigarettes  ?  Start date: 59  ?  Quit date: 01/16/2021  ?  Years since quitting: 0.5  ? Smokeless tobacco: Never  ?Vaping Use  ? Vaping Use: Every day  ? Start date: 01/16/2021  ? Substances: Nicotine  ?Substance and Sexual Activity  ? Alcohol use: No  ?  Alcohol/week: 0.0 standard drinks  ? Drug use: No  ? Sexual activity: Yes  ?  Birth control/protection: None  ?Other Topics Concern  ? Not on file  ?Social History Narrative  ? Lives with husband  ? Right Handed  ? Drinks 6-7 cups caffeine daily  ? ?Social  Determinants of Health  ? ?Financial Resource Strain: Not on file  ?Food Insecurity: Not on file  ?Transportation Needs: Not on file  ?Physical Activity: Not on file  ?Stress: Not on file  ?Social Connections: Not on file  ?Intimate Partner Violence: Not on file  ?  ? ?Allergies  ?Allergen Reactions  ? Morphine Itching  ? Penicillins Rash  ?  Has patient had a PCN reaction causing immediate rash, facial/tongue/throat swelling, SOB or lightheadedness with hypotension: No ?Has patient had a PCN reaction causing severe rash involving mucus membranes or skin necrosis: No ?Has patient had a PCN reaction that required hospitalization No ?Has patient had a PCN reaction occurring within the last 10 years: No ?If all of the above answers are "NO", then may proceed with Cephalosporin use. ?  ?  ? ?Outpatient Medications Prior to Visit  ?Medication Sig Dispense Refill  ? dexamethasone (DECADRON) 4 MG tablet Take 1 tablet (4 mg total) by mouth every 12 (twelve) hours. (Patient taking differently: Take 2 mg by mouth 3 (three) times daily.) 60 tablet 0  ? divalproex (DEPAKOTE) 250 MG DR tablet Take 1 tablet (250 mg total) by mouth 2 (two) times daily. (In addition to '500mg'$  twice daily for a total dosage of '750mg'$  BID). 60 tablet 6  ? divalproex (DEPAKOTE) 500 MG DR tablet Take 1 tablet (500 mg total) by mouth 2 (two) times daily. 60 tablet 6  ? docusate sodium (COLACE) 100 MG capsule Take 1 capsule (100 mg total) by mouth daily as needed for up to 30 doses for mild constipation. To prevent constipation while taking oxycodone 30 capsule 0  ? ELIQUIS 5 MG TABS tablet Take 1 tablet by mouth twice daily 60 tablet 1  ? esomeprazole (NEXIUM) 40 MG capsule TAKE 1 CAPSULE BY MOUTH ONCE DAILY . APPOINTMENT REQUIRED FOR FUTURE REFILLS 90 capsule 0  ? furosemide (LASIX) 40 MG tablet Take 1 tablet (40 mg total) by mouth daily. 30 tablet 0  ? gentamicin cream (GARAMYCIN) 0.1 % Apply 1 application topically at bedtime.    ? levETIRAcetam  (KEPPRA) 500 MG tablet Take 1 tablet (500 mg total) by mouth 2 (two) times daily. 60 tablet 6  ? Nystatin (GERHARDT'S BUTT CREAM) CREA Apply 1 application topically 3 (three) times daily.    ? ondansetron (ZOFRAN-ODT) 4 MG disintegrating tablet Take 4 mg by mouth every 8 (eight) hours as needed for nausea or vomiting.    ? oxyCODONE (ROXICODONE) 5 MG immediate release tablet Take 1 tablet (5 mg total) by mouth every 6 (six) hours as needed for up to 15 doses for severe pain. 15 tablet 0  ? PARoxetine (PAXIL) 40 MG tablet  Take 1 tablet (40 mg total) by mouth daily. 90 tablet 0  ? traZODone (DESYREL) 50 MG tablet Take 0.5 tablets (25 mg total) by mouth at bedtime. 15 tablet 0  ? ?No facility-administered medications prior to visit.  ? ? ? ? ? ?Objective:  ? ?Physical Exam: ? ?General appearance: 61 y.o., female, NAD, conversant, a little drowsy ?Eyes: anicteric sclerae; PERRL, tracking appropriately ?HENT: NCAT; MMM ?Neck: Trachea midline; no lymphadenopathy, no JVD ?Lungs: CTAB, no crackles, no wheeze, with normal respiratory effort ?CV: RRR, no murmur  ?Abdomen: Soft, non-tender; non-distended, BS present  ?Extremities: 2+ BLE pitting edema, warm ?Skin: Normal turgor and texture; no rash ?Psych: Appropriate affect ?Neuro: Alert and oriented to person and place, no focal deficit  ? ? ? ?There were no vitals filed for this visit. ? ?  on 4L O2 ?BMI Readings from Last 3 Encounters:  ?08/08/21 39.82 kg/m?  ?07/29/21 30.04 kg/m?  ?07/20/21 30.04 kg/m?  ? ?Wt Readings from Last 3 Encounters:  ?08/08/21 232 lb (105.2 kg)  ?07/29/21 175 lb (79.4 kg)  ?07/20/21 175 lb (79.4 kg)  ? ? ? ?CBC ?   ?Component Value Date/Time  ? WBC 4.9 07/20/2021 1113  ? RBC 3.52 (L) 07/20/2021 1113  ? HGB 11.8 (L) 07/20/2021 1113  ? HGB 10.8 (L) 05/20/2021 1030  ? HCT 36.1 07/20/2021 1113  ? HCT 33.2 (L) 05/20/2021 1030  ? PLT 222 07/20/2021 1113  ? PLT 203 05/20/2021 1030  ? MCV 102.6 (H) 07/20/2021 1113  ? MCV 99 (H) 05/20/2021 1030  ? MCH  33.5 07/20/2021 1113  ? MCHC 32.7 07/20/2021 1113  ? RDW 14.8 07/20/2021 1113  ? RDW 14.6 05/20/2021 1030  ? LYMPHSABS 0.4 (L) 07/20/2021 1113  ? LYMPHSABS 2.5 07/23/2017 0820  ? MONOABS 0.4 07/20/2021 1113  ? EOSA

## 2021-08-12 ENCOUNTER — Other Ambulatory Visit: Payer: Self-pay

## 2021-08-12 ENCOUNTER — Ambulatory Visit: Payer: BC Managed Care – PPO | Admitting: Student

## 2021-08-12 ENCOUNTER — Encounter: Payer: Self-pay | Admitting: Student

## 2021-08-12 VITALS — BP 144/72 | HR 91 | Temp 97.9°F | Ht 64.0 in | Wt 240.0 lb

## 2021-08-12 DIAGNOSIS — I824Y9 Acute embolism and thrombosis of unspecified deep veins of unspecified proximal lower extremity: Secondary | ICD-10-CM | POA: Diagnosis not present

## 2021-08-12 DIAGNOSIS — R918 Other nonspecific abnormal finding of lung field: Secondary | ICD-10-CM | POA: Diagnosis not present

## 2021-08-12 DIAGNOSIS — I2699 Other pulmonary embolism without acute cor pulmonale: Secondary | ICD-10-CM

## 2021-08-12 DIAGNOSIS — J9601 Acute respiratory failure with hypoxia: Secondary | ICD-10-CM | POA: Diagnosis not present

## 2021-08-12 DIAGNOSIS — R0609 Other forms of dyspnea: Secondary | ICD-10-CM

## 2021-08-12 LAB — URINALYSIS
Bilirubin Urine: NEGATIVE
Ketones, ur: NEGATIVE
Leukocytes,Ua: NEGATIVE
Nitrite: NEGATIVE
Specific Gravity, Urine: 1.025 (ref 1.000–1.030)
Total Protein, Urine: NEGATIVE
Urine Glucose: NEGATIVE
Urobilinogen, UA: 0.2 (ref 0.0–1.0)
pH: 6 (ref 5.0–8.0)

## 2021-08-12 LAB — BASIC METABOLIC PANEL
BUN: 14 mg/dL (ref 6–23)
CO2: 36 mEq/L — ABNORMAL HIGH (ref 19–32)
Calcium: 8.5 mg/dL (ref 8.4–10.5)
Chloride: 100 mEq/L (ref 96–112)
Creatinine, Ser: 0.46 mg/dL (ref 0.40–1.20)
GFR: 103.9 mL/min (ref >60.00)
Glucose, Bld: 134 mg/dL — ABNORMAL HIGH (ref 70–99)
Potassium: 4 mEq/L (ref 3.5–5.1)
Sodium: 140 mEq/L (ref 135–145)

## 2021-08-12 MED ORDER — APIXABAN 5 MG PO TABS: 5.0000 mg | ORAL_TABLET | Freq: Two times a day (BID) | ORAL | 3 refills | Status: AC

## 2021-08-12 MED ORDER — ANORO ELLIPTA 62.5-25 MCG/ACT IN AEPB: 1.0000 | INHALATION_SPRAY | Freq: Every day | RESPIRATORY_TRACT | 0 refills | Status: DC

## 2021-08-12 NOTE — Patient Instructions (Addendum)
-   I'll review labs today and let you know if we need to increase diuretic ?- try anoro 1 puff once daily, I'll send message to pharmacy to find out which inhaler is most affordable for you ?- ct chest in 3 months ?- stay on eliquis ?- echo of your heart you'll be called to schedule ?- see you in 3 months! ?

## 2021-08-12 NOTE — Progress Notes (Signed)
? ? ?Patient ID: Melissa Case, female    DOB: [birth year]  MRN: [id number] ? ?CC: Hospital Discharge Follow-Up ? ?Subjective: ?Melissa Case is a 61 y.o. female who presents for hospital discharge follow-up. She is accompanied by her husband.  ? ?Her concerns today include:  ?1. HOSPITAL DISCHARGE FOLLOW-UP: ?07/20/2021 [facility] per MD note: ?Patient presented with urinary frequency has no evidence of UTI here.  Concern for possible undiagnosed diabetes and blood sugar was appropriate.  No leukocytosis on her CBC.  Patient follow-up with her doctor as needed ?  ?2:46 PM ?At time of discharge, patient's husband present and states that patient has had periods of confusion here.  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 Head CT performed here which did not show any new findings here.  Foley catheter placed for patient's urinary incontinence.  She has no new weakness to her lower extremities.  Doubt spinal cord etiology.  Patient be given referral to neurology on-call and sent home with Foley catheter ?  ?08/19/2021: ?Reports no longer has foley catheter which was removed by Urology.  ? ?2. HOSPITAL DISCHARGE FOLLOW-UP: ?07/27/2021 [facility] per MD note: ?Patient brought by husband for evaluation of weeping from the abdominal wall as described in the HPI.  There is a small area to the left lower abdomen where clear fluid is seeping through the skin.  I feel nothing I see nothing that indicates an abscess or infection.  The drainage is not purulent or bloody. ?  ?I suspect the patient has some edema to the abdominal wall and this is edematous fluid seeping through.  She had a CT scan performed 2 weeks ago showing no abnormality with the bladder.  I highly doubt a bladder fistula has formed in the past 2 weeks.  I feel as though patient can safely be discharged with local dressing changes and follow-up with primary doctor. ?  ?I will prescribe Lasix as patient does have  edema to both lower extremities and extending into the abdomen and see if this helps resolve the fluid leaking issue. ? ?08/19/2021: ?Reports feeling better. No longer having abdominal drainage. Swelling of lower extremities has improved. Lasix helped. ? ?3. HOSPITAL DISCHARGE FOLLOW-UP: ?08/16/2021 - 08/17/2021 at [facility] Emergency Department per MD note: ?Return for intractable cough, coughing up blood, fevers > 100.4 unrelieved by medication, shortness of breath, intractable vomiting, chest pain, shortness of breath, weakness, numbness, changes in speech, facial asymmetry, abdominal pain, passing out, Inability to tolerate liquids or food, cough, altered mental status or any concerns. No signs of systemic illness or infection. The patient is nontoxic-appearing on exam and vital signs are within normal limits.  ?I have reviewed the triage vital signs and the nursing notes. Pertinent labs & imaging results that were available during my care of the patient were reviewed by me and considered in my medical decision making (see chart for details). After history, exam, and medical workup I feel the patient has been appropriately medically screened and is safe for discharge home. Pertinent diagnoses were discussed with the patient. Patient was given return precautions.  ? ?08/19/2021: ?Reports feeling ok today. Using wheelchair only for transport from car to facility or home. Using two wheel walker inside of the home. Has trouble getting out of chair. Once standing she is able to ambulate. PT was coming to her home but they discontinued services because of edema of lower extremities.  ? ?Patient Active Problem List  ?  Diagnosis Date Noted  ? Acute respiratory failure with hypoxia ([place])   ? Pulmonary emboli ([place]) 05/11/2021  ? DVT (deep venous thrombosis) ([place]) 05/10/2021  ? Prediabetes 03/04/2021  ? 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 Cerebral edema ([place]) 12/30/2020  ? Seizure ([place]) 11/17/2020  ?  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 History of radiation to head and neck region 09/16/2019  ? 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 Lesion of parotid gland 07/14/2019  ? Acute calculous cholecystitis 11/21/2017  ? Gastro-esophageal reflux disease without esophagitis 02/03/2014  ? Abdominal pain 09/30/2013  ? SBO (small bowel obstruction) ([place]) 09/30/2013  ? 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 Gall stone 04/05/2012  ? Barrett's esophagus 04/05/2012  ? Cholelithiasis 03/27/2012  ? Hyperlipidemia 03/25/2012  ? Depression 09/15/2011  ? 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  ? ?Current Outpatient Medications on File Prior to Visit  ?Medication Sig Dispense Refill  ? Acetaminophen (TYLENOL ARTHRITIS PAIN PO) Take 2 tablets by mouth as needed.    ? apixaban (ELIQUIS) 5 MG TABS tablet Take 1 tablet (5 mg total) by mouth 2 (two) times daily. 60 tablet 3  ? calcium carbonate (TUMS - DOSED IN MG ELEMENTAL CALCIUM) 500 MG chewable tablet Chew 1 tablet by mouth as needed for indigestion or heartburn. Takes for breakthrough heartburn    ? dexamethasone (DECADRON) 2 MG tablet Take 2 mg by mouth 3 (three) times daily.    ? divalproex (DEPAKOTE) 250 MG DR tablet Take 1 tablet (250 mg total) by mouth 2 (two) times daily. (In addition to '500mg'$  twice daily for a total dosage of '750mg'$  BID). 60 tablet 6  ? divalproex (DEPAKOTE) 500 MG DR tablet Take 1 tablet (500 mg total) by mouth 2 (two) times daily. 60 tablet 6  ? docusate sodium (COLACE) 100 MG capsule Take 1 capsule (100 mg total) by mouth daily as needed for up to 30 doses for mild constipation. To prevent constipation while taking oxycodone 30 capsule 0  ? esomeprazole (NEXIUM) 40 MG capsule Take 1 capsule (40 mg total) by mouth 2 (two) times daily before a meal. 180 capsule 3  ? furosemide (LASIX) 40 MG tablet Take 1 tablet (40 mg total) by mouth daily. 30 tablet 0  ? levETIRAcetam (KEPPRA) 500 MG tablet Take 1 tablet (500 mg  total) by mouth 2 (two) times daily. 60 tablet 6  ? ondansetron (ZOFRAN-ODT) 4 MG disintegrating tablet Take 4 mg by mouth every 8 (eight) hours as needed for nausea or vomiting.    ? PARoxetine (PAXIL) 40 MG tablet Take 1 tablet (40 mg total) by mouth daily. 90 tablet 0  ? traZODone (DESYREL) 50 MG tablet Take 0.5 tablets (25 mg total) by mouth at bedtime. (Patient taking differently: Take 25 mg by mouth at bedtime as needed.) 15 tablet 0  ? umeclidinium-vilanterol (ANORO ELLIPTA) 62.5-25 MCG/ACT AEPB Inhale 1 puff into the lungs daily. 14 each 0  ? ?No current facility-administered medications on file prior to visit.  ? ? ?Allergies  ?Allergen Reactions  ? Morphine Itching  ? Penicillins Rash  ?  Has patient had a PCN reaction causing immediate rash, facial/tongue/throat swelling, SOB or lightheadedness with hypotension: No ?Has patient had a PCN reaction causing severe rash involving mucus membranes or skin necrosis: No ?Has patient had a PCN reaction that required hospitalization No ?Has patient had a PCN reaction occurring within the last 10 years: No ?If all of  the above answers are "NO", then may proceed with Cephalosporin use. ?  ? ? ?Social History  ? ?Socioeconomic History  ? Marital status: Married  ?  Spouse name: [name]  ? Number of children: 4  ? Years of education: Not on file  ? Highest education level: Not on file  ?Occupational History  ? Not on file  ?Tobacco Use  ? Smoking status: Former  ?  Packs/day: 0.50  ?  Years: 42.00  ?  Pack years: 21.00  ?  Types: Cigarettes  ?  Start date: 31  ?  Quit date: 01/16/2021  ?  Years since quitting: 0.5  ?  Passive exposure: Past  ? Smokeless tobacco: Never  ?Vaping Use  ? Vaping Use: Every day  ? Start date: 01/16/2021  ? Substances: Nicotine  ?Substance and Sexual Activity  ? Alcohol use: No  ?  Alcohol/week: 0.0 standard drinks  ? Drug use: No  ? Sexual activity: Yes  ?  Birth control/protection: None  ?Other Topics Concern  ? Not on file  ?Social History  Narrative  ? Lives with husband  ? Right Handed  ? Drinks 6-7 cups caffeine daily  ? ?Social Determinants of Health  ? ?Financial Resource Strain: Not on file  ?Food Insecurity: Not on file  ?Transportation

## 2021-08-13 LAB — PROTEIN / CREATININE RATIO, URINE
Creatinine, Urine: 73 mg/dL (ref 20–275)
Protein/Creat Ratio: 205 mg/g creat — ABNORMAL HIGH (ref 24–184)
Protein/Creatinine Ratio: 0.205 mg/mg creat — ABNORMAL HIGH (ref 0.024–0.184)
Total Protein, Urine: 15 mg/dL (ref 5–24)

## 2021-08-15 ENCOUNTER — Ambulatory Visit: Payer: BC Managed Care – PPO | Admitting: Gastroenterology

## 2021-08-15 ENCOUNTER — Encounter: Payer: Self-pay | Admitting: Gastroenterology

## 2021-08-15 VITALS — BP 132/70 | HR 96 | Ht 64.0 in | Wt 246.6 lb

## 2021-08-15 DIAGNOSIS — Z7901 Long term (current) use of anticoagulants: Secondary | ICD-10-CM

## 2021-08-15 DIAGNOSIS — K219 Gastro-esophageal reflux disease without esophagitis: Secondary | ICD-10-CM

## 2021-08-15 DIAGNOSIS — K2270 Barrett's esophagus without dysplasia: Secondary | ICD-10-CM

## 2021-08-15 MED ORDER — ESOMEPRAZOLE MAGNESIUM 40 MG PO CPDR: 40.0000 mg | DELAYED_RELEASE_CAPSULE | Freq: Two times a day (BID) | ORAL | 3 refills | Status: AC

## 2021-08-15 NOTE — Patient Instructions (Addendum)
It was my pleasure to provide care to you today. Based on our discussion, I am providing you with my recommendations below: ? ?RECOMMENDATION(S):  ? ?Increase her Nexium to 40 mg taken twice daily.  Hopefully this will decrease your heartburn at night and allow you to get better sleep. ? ?It is time for surveillance upper endoscopy given your history of Barrett's esophagus and a colonoscopy for colon cancer screening.  We discussed the timing of these procedures given your ongoing need for blood thinners and your history of seizures.  You suggested that we wait 6 months.  We will tentatively plan to have these procedures performed at [facility] long hospital in 6 months if were able to her obtain permission from the doctor who prescribes your Eliquis because you will need to hold it for 2 days prior to the procedure and we will need to obtain permission from your neurologist. ? ?Please call me with any questions or concerns before that time. ? ?FOLLOW UP: ? ?I would like for you to follow up with me in the office at least every 12 to 18 months if you continue to need Nexium. Please call the office at [phone] to schedule your appointment. ? ?BMI: ? ?If you are age 77 or younger, your body mass index should be between 19-25. Your Body mass index is 42.33 kg/m?Marland Kitchen If this is out of the aformentioned range listed, please consider follow up with your Primary Care Provider.  ? ?MY CHART: ? ?The [facility] providers would like to encourage you to use [facility] to communicate with providers for non-urgent requests or questions.  Due to long hold times on the telephone, sending your provider a message by [facility] may be a faster and more efficient way to get a response.  Please allow 48 business hours for a response.  Please remember that this is for non-urgent requests.  ? ?Thank you for trusting me with your gastrointestinal care!   ? ?[place], MD, MPH ? ?

## 2021-08-15 NOTE — Progress Notes (Signed)
? ?Referring Provider: Camillia Herter, NP ?Primary Care Physician:  Camillia Herter, NP ? ?Reason for Consultation:  Barrett's Esophagus, needs for reflux ? ? ?IMPRESSION:  ?History of short segment Barrett's esophagus diagnosed by Dr. Vira Agar 2007 ?   - Last EGD 2018 showed no Barrett's on biopsy results ?   - Surveillance recommendations will be made after reviewing prior records ?   - continue daily PPI therapy ?   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   - lack of data for preventative care ?   - follow-up with surgery with recurrent symptoms ?Metastatic salivary ductal carcinoma  ?   - s/p surgery on 07/22/2019 and postoperative radiation therapy, ongoing facial nerve paralysis ?Normal screening colonoscopy 2013 ?   - surveillance due this year ?No known family history of colon cancer or polyps or other primary GI malignancies ? ?PLAN: ?- Increase Nexium to 40 mg BID (90 day with 3 refills) ?- Reviewed reflux lifestyle modifications ?- Reviewed long-term risks of PPI therapy ?- EGD for Barrett's surveillance and colonoscopy for colon cancer surveillance in 6 months (timing at her request) assuming we are able to to hold her Eliquis for at least 2 days prior to the procedure and we are able to get clearance from Neurology. Procedures will be performed at [facility]  ?- Follow-up every 12-18 months, earlier if needed ? ?Please see the "Patient Instructions" section for addition details about the plan. ? ?HPI: Melissa Case is a 61 y.o. female last seen for her initial office visit for Barrett's Esophagus 05/28/20.  The interval history is obtained through the patient, her husband who accompanies her to this appointment and review of her electronic health record.  ?  ?She has a history of metastatic salivary ductal carcinoma status post surgery on 07/22/2019 and  postoperative radiation therapy with ongoing facial nerve paralysis, XRT to head/neck, cholecystectomy 2019,  anxiety, and depression, unprovoked PE (05/10/21) without right heart strain, seizures being followed by neurology, and bilateral DVT (05/10/21) now on eliquis.  She has required hospitalization for small bowel obstruction not requiring surgery in 2015 and again recently 03/2020. She had similar symptoms in 2020 that did not require hospitalization.  ? ?She carries a diagnosis of short-segment Barrett's esophagus as diagnosed by Dr. Vira Agar 2013.  ? ?This morning I reviewed 35 pages of records from the [place] clinic.  Most recent visit was in 2010.  Diagnoses include (1) gastroesophageal reflux disease with Barrett's esophagus diagnosed on EGD 2007,  EGD 2010 with short segment Barrett's without dysplasia.  Z-line at 35 cm from the incisors.  EGD 2013 showed 1 cm Barrett's.  (2) Intermittent right-sided chest pain that could be GERD related. (3) H pylori negative gastritis on EGD 2010 a ? ?Last EGD with [facility] in 2018. She remembers being told that she didn't need to do this again for 5 years.   ? ?Returns today requesting a refill on Nexium. She has been on multiple PPIs over the years, but finds that Nexium works best at controlling her reflux.  Uses TUMS for breakthrough symptoms several nights a week. Some interference with sleep due to the frequency.  No anorexia, unexplained weight loss, dysphagia, odynophagia, persistent vomiting, or gastrointestinal cancer in a first-degree relative. ? ?GI ROS is otherwise negative.  She has been using Tylenol for leg pain.  No NSAIDs. ? ?Data reviewed today includes: ?- CT abdomen pelvis with contrast 03/21/2020: High-grade small bowel obstruction with point of transition in the anterior right lower quadrant likely related to adhesions.  No perforation.  No ischemia. ?- Abdominal x-ray 03/22/2020: Transit of oral contrast throughout the colon excluding high-grade  obstruction, residual nonspecific fluid-filled loops of small bowel ?- Abdominal x-ray 03/23/2020: Contrast has proceeded through the bowel to the distal sigmoid colon and rectum ?- CT of the abdomen and pelvis with contrast 07/12/2021: No acute abnormality, adhesions, cavitary lesion of the right lower lobe with air-fluid level thought to be a sequelae of pulmonary infarct ? ?Prior endoscopic evaluation: ?- EGD 2007 with Dr. Vira Agar: GERD and Barrett's Esophagus (biopsy results not available) ?- EGD 2010 with Dr. Vira Agar: short segment Barrett's without dysplasia (biopsy results not available), gastritis ?- Colonoscopy 2013 with Dr. Vira Agar showed internal hemorrhoids. Surveillance recommended in 10 years.  ?- EGD 2013 with Dr. Vira Agar shows 1 cm Barrett's Esophagus but biopsies were negative ?- EGD with Dr. Therisa Doyne 2018: Irregular z-line, reflux, gastritis, no Barrett's on biopsies, no H pylori ? ? ? ?Father with peptic ulcer disease and a history of gastrectomy.  No known family history of colon cancer or polyps. No family history of uterine/endometrial cancer, pancreatic cancer or gastric/stomach cancer. ? ? ?Past Medical History:  ?Diagnosis Date  ? Cancer [facility])   ? Partoid  ? Depression   ? GERD (gastroesophageal reflux disease)   ? History of radiation to head and neck region 09/16/2019  ? 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  ? Panic attack   ? Seizures ([place])   ? ? ?Past Surgical History:  ?Procedure Laterality Date  ? ABDOMINAL HYSTERECTOMY    ? CHOLECYSTECTOMY N/A 11/21/2017  ? Procedure: LAPAROSCOPIC CHOLECYSTECTOMY WITH INTRAOPERATIVE CHOLANGIOGRAM;  Surgeon: Jovita Kussmaul, MD;  Location: [facility];  Service: General;  Laterality: N/A;  ? MULTIPLE EXTRACTIONS WITH ALVEOLOPLASTY N/A 08/28/2019  ? Procedure: Extraction of tooth #'s 3-7, 10-15, and 20-31 with alveoloplatsy, maxillary right buccal exostosis reduction, and bilateral mandibular tori reductions.;  Surgeon: Lenn Cal,  DDS;  Location: [place];  Service: Oral Surgery;  Laterality: N/A;  ? PAROTIDECTOMY Right 07/22/2019  ? with biopsy; done at [facility] by Dr. Fredricka Bonine  ? ? ?Current Outpatient Medications  ?Medication Sig Dispense Refill  ? Acetaminophen (TYLENOL ARTHRITIS PAIN PO) Take 2 tablets by mouth as needed.    ? apixaban (ELIQUIS) 5 MG TABS tablet Take 1 tablet (5 mg total) by mouth 2 (two) times daily. 60 tablet 3  ? calcium carbonate (TUMS - DOSED IN MG ELEMENTAL CALCIUM) 500 MG chewable tablet Chew 1 tablet by mouth as needed for indigestion or heartburn. Takes for breakthrough heartburn    ? dexamethasone (DECADRON) 2 MG tablet Take 2 mg by mouth 3 (three) times daily.    ? divalproex (DEPAKOTE) 250 MG DR tablet Take 1 tablet (250 mg total) by mouth 2 (two) times daily. (In addition to '500mg'$  twice daily for a total dosage of '750mg'$  BID). 60 tablet 6  ? divalproex (DEPAKOTE) 500 MG DR tablet Take 1 tablet (500 mg total) by mouth 2 (two) times daily. 60 tablet 6  ? docusate sodium (COLACE) 100 MG capsule Take 1 capsule (100 mg total) by mouth daily as needed for up to 30 doses for mild constipation. To prevent constipation while taking oxycodone 30 capsule 0  ? esomeprazole (NEXIUM) 40 MG capsule TAKE 1 CAPSULE  BY MOUTH ONCE DAILY . APPOINTMENT REQUIRED FOR FUTURE REFILLS 90 capsule 0  ? furosemide (LASIX) 40 MG tablet Take 1 tablet (40 mg total) by mouth daily. 30 tablet 0  ? levETIRAcetam (KEPPRA) 500 MG tablet Take 1 tablet (500 mg total) by mouth 2 (two) times daily. 60 tablet 6  ? ondansetron (ZOFRAN-ODT) 4 MG disintegrating tablet Take 4 mg by mouth every 8 (eight) hours as needed for nausea or vomiting.    ? PARoxetine (PAXIL) 40 MG tablet Take 1 tablet (40 mg total) by mouth daily. 90 tablet 0  ? traZODone (DESYREL) 50 MG tablet Take 0.5 tablets (25 mg total) by mouth at bedtime. (Patient taking differently: Take 25 mg by mouth at bedtime as needed.) 15 tablet 0  ? umeclidinium-vilanterol (ANORO ELLIPTA)  62.5-25 MCG/ACT AEPB Inhale 1 puff into the lungs daily. 14 each 0  ? ?No current facility-administered medications for this visit.  ? ? ?Allergies as of 08/15/2021 - Review Complete 08/15/2021  ?[place]

## 2021-08-16 ENCOUNTER — Emergency Department (HOSPITAL_COMMUNITY)
Admission: EM | Admit: 2021-08-16 | Discharge: 2021-08-17 | Disposition: A | Discharge status: Home / Self Care | Payer: BC Managed Care – PPO | Attending: Emergency Medicine | Admitting: Emergency Medicine

## 2021-08-16 DIAGNOSIS — Z7901 Long term (current) use of anticoagulants: Secondary | ICD-10-CM | POA: Insufficient documentation

## 2021-08-16 DIAGNOSIS — W19XXXA Unspecified fall, initial encounter: Secondary | ICD-10-CM

## 2021-08-16 DIAGNOSIS — S199XXA Unspecified injury of neck, initial encounter: Secondary | ICD-10-CM | POA: Diagnosis not present

## 2021-08-16 DIAGNOSIS — R29810 Facial weakness: Secondary | ICD-10-CM | POA: Diagnosis not present

## 2021-08-16 DIAGNOSIS — J3489 Other specified disorders of nose and nasal sinuses: Secondary | ICD-10-CM | POA: Diagnosis not present

## 2021-08-16 DIAGNOSIS — S0993XA Unspecified injury of face, initial encounter: Secondary | ICD-10-CM | POA: Diagnosis not present

## 2021-08-16 DIAGNOSIS — R109 Unspecified abdominal pain: Secondary | ICD-10-CM | POA: Insufficient documentation

## 2021-08-16 DIAGNOSIS — W01198A Fall on same level from slipping, tripping and stumbling with subsequent striking against other object, initial encounter: Secondary | ICD-10-CM | POA: Diagnosis not present

## 2021-08-16 DIAGNOSIS — I10 Essential (primary) hypertension: Secondary | ICD-10-CM | POA: Diagnosis not present

## 2021-08-16 DIAGNOSIS — E041 Nontoxic single thyroid nodule: Secondary | ICD-10-CM

## 2021-08-16 DIAGNOSIS — G9389 Other specified disorders of brain: Secondary | ICD-10-CM | POA: Diagnosis not present

## 2021-08-16 DIAGNOSIS — R1084 Generalized abdominal pain: Secondary | ICD-10-CM | POA: Diagnosis not present

## 2021-08-16 DIAGNOSIS — I700 Atherosclerosis of aorta: Secondary | ICD-10-CM | POA: Diagnosis not present

## 2021-08-16 LAB — BRAIN NATRIURETIC PEPTIDE: Pro B Natriuretic peptide (BNP): 28 pg/mL (ref 0.0–100.0)

## 2021-08-16 NOTE — ED Triage Notes (Signed)
Patient arrived via EMS from home after slipping/falling out of her recliner. Patient did not hit her head, no LOC. Complains of lower right abdominal pain. Patient was able to walk to the stretcher. Patient has right sided facial droop at baseline from previous surgery. Patient takes Eliquis. Patient is also concerned about a blood clot in her lungs.  ?

## 2021-08-17 ENCOUNTER — Encounter (HOSPITAL_COMMUNITY): Payer: Self-pay

## 2021-08-17 ENCOUNTER — Emergency Department (HOSPITAL_COMMUNITY): Payer: BC Managed Care – PPO

## 2021-08-17 ENCOUNTER — Other Ambulatory Visit: Payer: Self-pay

## 2021-08-17 DIAGNOSIS — S0993XA Unspecified injury of face, initial encounter: Secondary | ICD-10-CM | POA: Diagnosis not present

## 2021-08-17 DIAGNOSIS — J3489 Other specified disorders of nose and nasal sinuses: Secondary | ICD-10-CM | POA: Diagnosis not present

## 2021-08-17 DIAGNOSIS — G9389 Other specified disorders of brain: Secondary | ICD-10-CM | POA: Diagnosis not present

## 2021-08-17 DIAGNOSIS — S199XXA Unspecified injury of neck, initial encounter: Secondary | ICD-10-CM | POA: Diagnosis not present

## 2021-08-17 DIAGNOSIS — R109 Unspecified abdominal pain: Secondary | ICD-10-CM | POA: Diagnosis not present

## 2021-08-17 DIAGNOSIS — I700 Atherosclerosis of aorta: Secondary | ICD-10-CM | POA: Diagnosis not present

## 2021-08-17 MED ORDER — ACETAMINOPHEN 325 MG PO TABS: 650.0000 mg | ORAL_TABLET | Freq: Once | ORAL | Status: DC

## 2021-08-17 NOTE — ED Notes (Signed)
Back from CT

## 2021-08-17 NOTE — ED Notes (Signed)
Patient was able to void using a bedpan. Urine specimen collected if needed.  ?

## 2021-08-17 NOTE — Discharge Instructions (Addendum)
Need outpatient thyroid ultrasound ?

## 2021-08-17 NOTE — ED Provider Notes (Signed)
?[facility] DEPT ?Provider Note ? ? ?CSN: [id number] ?Arrival date & time: 08/16/21  2331 ? ?  ? ?History ? ?Chief Complaint  ?Patient presents with  ? Fall  ? Abdominal Pain  ? ? ?Melissa Case is a 61 y.o. female. ? ?The history is provided by the patient.  ?Fall ?This is a new problem. The current episode started less than 1 hour ago. The problem occurs constantly. The problem has not changed since onset.Associated symptoms include abdominal pain. Pertinent negatives include no chest pain and no shortness of breath. Nothing aggravates the symptoms. Nothing relieves the symptoms. She has tried nothing for the symptoms. The treatment provided no relief.  ?Abdominal Pain ?Associated symptoms: no chest pain, no fever and no shortness of breath   ?Fell and hit head and abdomen and hit self on floor after slipping.   ?  ? ?Home Medications ?Prior to Admission medications   ?Medication Sig Start Date End Date Taking? Authorizing Provider  ?Acetaminophen (TYLENOL ARTHRITIS PAIN PO) Take 2 tablets by mouth as needed.    [provider]  ?apixaban (ELIQUIS) 5 MG TABS tablet Take 1 tablet (5 mg total) by mouth 2 (two) times daily. 08/12/21   Maryjane Hurter, MD  ?calcium carbonate (TUMS - DOSED IN MG ELEMENTAL CALCIUM) 500 MG chewable tablet Chew 1 tablet by mouth as needed for indigestion or heartburn. Takes for breakthrough heartburn    [provider]  ?dexamethasone (DECADRON) 2 MG tablet Take 2 mg by mouth 3 (three) times daily.    [provider]  ?divalproex (DEPAKOTE) 250 MG DR tablet Take 1 tablet (250 mg total) by mouth 2 (two) times daily. (In addition to '500mg'$  twice daily for a total dosage of '750mg'$  BID). 08/08/21   Alric Ran, MD  ?divalproex (DEPAKOTE) 500 MG DR tablet Take 1 tablet (500 mg total) by mouth 2 (two) times daily. 08/08/21   Alric Ran, MD  ?docusate sodium (COLACE) 100 MG capsule Take 1 capsule (100 mg total) by mouth daily as  needed for up to 30 doses for mild constipation. To prevent constipation while taking oxycodone 06/20/21   Wyvonnia Dusky, MD  ?esomeprazole (NEXIUM) 40 MG capsule Take 1 capsule (40 mg total) by mouth 2 (two) times daily before a meal. 08/15/21   Thornton Park, MD  ?furosemide (LASIX) 40 MG tablet Take 1 tablet (40 mg total) by mouth daily. 07/27/21   Veryl Speak, MD  ?levETIRAcetam (KEPPRA) 500 MG tablet Take 1 tablet (500 mg total) by mouth 2 (two) times daily. 08/08/21   Alric Ran, MD  ?ondansetron (ZOFRAN-ODT) 4 MG disintegrating tablet Take 4 mg by mouth every 8 (eight) hours as needed for nausea or vomiting.    [provider]  ?PARoxetine (PAXIL) 40 MG tablet Take 1 tablet (40 mg total) by mouth daily. 07/01/21   Arfeen, Arlyce Harman, MD  ?traZODone (DESYREL) 50 MG tablet Take 0.5 tablets (25 mg total) by mouth at bedtime. ?Patient taking differently: Take 25 mg by mouth at bedtime as needed. 07/15/21   Arfeen, Arlyce Harman, MD  ?umeclidinium-vilanterol (ANORO ELLIPTA) 62.5-25 MCG/ACT AEPB Inhale 1 puff into the lungs daily. 08/12/21   Maryjane Hurter, MD  ?   ? ?Allergies    ?Morphine and Penicillins   ? ?Review of Systems   ?Review of Systems  ?Constitutional:  Negative for fever.  ?HENT:  Negative for facial swelling.   ?Eyes:  Negative for redness.  ?Respiratory:  Negative for shortness  of breath.   ?Cardiovascular:  Negative for chest pain.  ?Gastrointestinal:  Positive for abdominal pain.  ?Genitourinary:  Negative for difficulty urinating.  ?Neurological:  Negative for facial asymmetry.  ?Psychiatric/Behavioral:  Negative for agitation.   ?All other systems reviewed and are negative. ? ?Physical Exam ?Updated Vital Signs ?BP 137/63   Pulse 66   Temp 97.6 ?F (36.4 ?C) (Oral)   Resp 17   Ht '5\' 4"'$  (1.626 m)   Wt 117.9 kg   SpO2 96%   BMI 44.63 kg/m?  ?Physical Exam ?Vitals and nursing note reviewed.  ?Constitutional:   ?   General: She is not in acute distress. ?   Appearance: Normal  appearance.  ?HENT:  ?   Head: Normocephalic and atraumatic.  ?   Nose: Nose normal.  ?   Mouth/Throat:  ?   Mouth: Mucous membranes are moist.  ?Eyes:  ?   Conjunctiva/sclera: Conjunctivae normal.  ?   Pupils: Pupils are equal, round, and reactive to light.  ?Cardiovascular:  ?   Rate and Rhythm: Normal rate and regular rhythm.  ?   Pulses: Normal pulses.  ?   Heart sounds: Normal heart sounds.  ?Pulmonary:  ?   Effort: Pulmonary effort is normal.  ?   Breath sounds: Normal breath sounds.  ?Abdominal:  ?   General: Bowel sounds are normal.  ?   Palpations: Abdomen is soft.  ?   Tenderness: There is no abdominal tenderness. There is no guarding or rebound.  ?   Hernia: No hernia is present.  ?Musculoskeletal:     ?   General: Normal range of motion.  ?   Cervical back: Normal range of motion and neck supple. No tenderness.  ?Skin: ?   General: Skin is warm and dry.  ?   Capillary Refill: Capillary refill takes less than 2 seconds.  ?Neurological:  ?   General: No focal deficit present.  ?   Mental Status: She is alert and oriented to person, place, and time.  ?Psychiatric:     ?   Mood and Affect: Mood normal.     ?   Behavior: Behavior normal.  ? ? ?ED Results / Procedures / Treatments   ?Labs ?(all labs ordered are listed, but only abnormal results are displayed) ?Labs Reviewed - No data to display ? ?EKG ?None ? ?Radiology ?CT ABDOMEN PELVIS WO CONTRAST ? ?Result Date: 08/17/2021 ?CLINICAL DATA:  Recent fall from chair with right-sided abdominal pain, initial encounter EXAM: CT ABDOMEN AND PELVIS WITHOUT CONTRAST TECHNIQUE: Multidetector CT imaging of the abdomen and pelvis was performed following the standard protocol without IV contrast. RADIATION DOSE REDUCTION: This exam was performed according to the departmental dose-optimization program which includes automated exposure control, adjustment of the mA and/or kV according to patient size and/or use of iterative reconstruction technique. COMPARISON:  07/12/2021  FINDINGS: Lower chest: Lung bases are free of acute infiltrate. There is a thick walled cavitary lesion identified in the posterior right costophrenic angle which is relatively stable from the prior exam. No new lesion is seen. Hepatobiliary: Fatty infiltration of the liver is noted. The gallbladder has been surgically removed. Pancreas: Unremarkable. No pancreatic ductal dilatation or surrounding inflammatory changes. Spleen: Normal in size without focal abnormality. Adrenals/Urinary Tract: Adrenal glands are within normal limits. Kidneys are well visualized bilaterally. No renal calculi or obstructive changes are seen. The ureters are within normal limits. The bladder is partially distended. Stomach/Bowel: No obstructive or inflammatory changes of the colon  are seen. The appendix is within normal limits. Small bowel and stomach are unremarkable. Vascular/Lymphatic: Aortic atherosclerosis. No enlarged abdominal or pelvic lymph nodes. Reproductive: Status post hysterectomy. No adnexal masses. Other: No abdominal wall hernia or abnormality. No abdominopelvic ascites. Musculoskeletal: Degenerative changes of lumbar spine and hip joints are seen. IMPRESSION: Fatty liver. No acute abnormality in the abdomen is noted. 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 This is been stable over multiple previous exams. Electronically Signed   By: Inez Catalina M.D.   On: 08/17/2021 01:35  ? ?CT Head Wo Contrast ? ?Result Date: 08/17/2021 ?CLINICAL DATA:  Facial trauma. EXAM: CT HEAD WITHOUT CONTRAST CT CERVICAL SPINE WITHOUT CONTRAST TECHNIQUE: Multidetector CT imaging of the head and cervical spine was performed following the standard protocol without intravenous contrast. Multiplanar CT image reconstructions of the cervical spine were also generated. RADIATION DOSE REDUCTION: This exam was performed according to the departmental dose-optimization program which includes automated  exposure control, adjustment of the mA and/or kV according to patient size and/or use of iterative reconstruction technique. COMPARISON:  Head CT dated 07/20/2021. FINDINGS: CT HEAD FINDINGS Evaluation is limited due

## 2021-08-19 ENCOUNTER — Ambulatory Visit (INDEPENDENT_AMBULATORY_CARE_PROVIDER_SITE_OTHER): Payer: BC Managed Care – PPO | Admitting: Family

## 2021-08-19 ENCOUNTER — Encounter: Payer: Self-pay | Admitting: Family

## 2021-08-19 VITALS — BP 128/80 | HR 76 | Temp 98.3°F | Resp 18 | Ht 64.02 in

## 2021-08-19 DIAGNOSIS — Z09 Encounter for follow-up examination after completed treatment for conditions other than malignant neoplasm: Secondary | ICD-10-CM | POA: Diagnosis not present

## 2021-08-19 DIAGNOSIS — R600 Localized edema: Secondary | ICD-10-CM

## 2021-08-19 DIAGNOSIS — W19XXXD Unspecified fall, subsequent encounter: Secondary | ICD-10-CM

## 2021-08-19 DIAGNOSIS — Z978 Presence of other specified devices: Secondary | ICD-10-CM

## 2021-08-19 DIAGNOSIS — E041 Nontoxic single thyroid nodule: Secondary | ICD-10-CM

## 2021-08-19 DIAGNOSIS — R300 Dysuria: Secondary | ICD-10-CM

## 2021-08-19 NOTE — Progress Notes (Signed)
Pt presents for hospital follow-up after a fall, pt states she can't seem to keep balance  ?Pt states to have SOB at bedtime per pt used O2 in past  ?

## 2021-08-20 ENCOUNTER — Emergency Department (HOSPITAL_COMMUNITY): Payer: BC Managed Care – PPO

## 2021-08-20 ENCOUNTER — Encounter (HOSPITAL_COMMUNITY): Payer: Self-pay | Admitting: Emergency Medicine

## 2021-08-20 ENCOUNTER — Emergency Department (HOSPITAL_COMMUNITY)
Admission: EM | Admit: 2021-08-20 | Discharge: 2021-08-20 | Disposition: A | Discharge status: Home / Self Care | Payer: BC Managed Care – PPO | Attending: Emergency Medicine | Admitting: Emergency Medicine

## 2021-08-20 ENCOUNTER — Other Ambulatory Visit: Payer: Self-pay

## 2021-08-20 DIAGNOSIS — M545 Low back pain, unspecified: Secondary | ICD-10-CM | POA: Insufficient documentation

## 2021-08-20 DIAGNOSIS — W19XXXA Unspecified fall, initial encounter: Secondary | ICD-10-CM | POA: Insufficient documentation

## 2021-08-20 DIAGNOSIS — N390 Urinary tract infection, site not specified: Secondary | ICD-10-CM | POA: Diagnosis not present

## 2021-08-20 DIAGNOSIS — S32000A Wedge compression fracture of unspecified lumbar vertebra, initial encounter for closed fracture: Secondary | ICD-10-CM

## 2021-08-20 DIAGNOSIS — M549 Dorsalgia, unspecified: Secondary | ICD-10-CM | POA: Diagnosis not present

## 2021-08-20 DIAGNOSIS — M4316 Spondylolisthesis, lumbar region: Secondary | ICD-10-CM | POA: Diagnosis not present

## 2021-08-20 DIAGNOSIS — Z7901 Long term (current) use of anticoagulants: Secondary | ICD-10-CM | POA: Insufficient documentation

## 2021-08-20 DIAGNOSIS — M8588 Other specified disorders of bone density and structure, other site: Secondary | ICD-10-CM | POA: Diagnosis not present

## 2021-08-20 LAB — COMPREHENSIVE METABOLIC PANEL
ALT: 86 U/L — ABNORMAL HIGH (ref 0–44)
AST: 29 U/L (ref 15–41)
Albumin: 2.8 g/dL — ABNORMAL LOW (ref 3.5–5.0)
Alkaline Phosphatase: 84 U/L (ref 38–126)
Anion gap: 9 (ref 5–15)
BUN: 21 mg/dL — ABNORMAL HIGH (ref 6–20)
CO2: 28 mmol/L (ref 22–32)
Calcium: 8.4 mg/dL — ABNORMAL LOW (ref 8.9–10.3)
Chloride: 97 mmol/L — ABNORMAL LOW (ref 98–111)
Creatinine, Ser: 0.49 mg/dL (ref 0.44–1.00)
GFR, Estimated: >60 mL/min (ref >60)
Glucose, Bld: 122 mg/dL — ABNORMAL HIGH (ref 70–99)
Potassium: 4.2 mmol/L (ref 3.5–5.1)
Sodium: 134 mmol/L — ABNORMAL LOW (ref 135–145)
Total Bilirubin: 0.3 mg/dL (ref 0.3–1.2)
Total Protein: 5.6 g/dL — ABNORMAL LOW (ref 6.5–8.1)

## 2021-08-20 LAB — CBC
HCT: 34.7 % — ABNORMAL LOW (ref 36.0–46.0)
Hemoglobin: 11.2 g/dL — ABNORMAL LOW (ref 12.0–15.0)
MCH: 32.6 pg (ref 26.0–34.0)
MCHC: 32.3 g/dL (ref 30.0–36.0)
MCV: 100.9 fL — ABNORMAL HIGH (ref 80.0–100.0)
Platelets: 207 10*3/uL (ref 150–400)
RBC: 3.44 MIL/uL — ABNORMAL LOW (ref 3.87–5.11)
RDW: 15.2 % (ref 11.5–15.5)
WBC: 6 10*3/uL (ref 4.0–10.5)
nRBC: 0 % (ref 0.0–0.2)

## 2021-08-20 LAB — URINALYSIS, ROUTINE W REFLEX MICROSCOPIC
Bilirubin Urine: NEGATIVE
Glucose, UA: NEGATIVE mg/dL
Ketones, ur: NEGATIVE mg/dL
Nitrite: POSITIVE — AB
Protein, ur: NEGATIVE mg/dL
Specific Gravity, Urine: 1.016 (ref 1.005–1.030)
pH: 7 (ref 5.0–8.0)

## 2021-08-20 LAB — VALPROIC ACID LEVEL: Valproic Acid Lvl: 45 ug/mL — ABNORMAL LOW (ref 50.0–100.0)

## 2021-08-20 MED ORDER — HYDROMORPHONE HCL 1 MG/ML IJ SOLN
1.0000 mg | Freq: Once | INTRAMUSCULAR | Status: AC
  Administered 2021-08-20: 1 mg via INTRAMUSCULAR
  Filled 2021-08-20: qty 1

## 2021-08-20 MED ORDER — HYDROCODONE-ACETAMINOPHEN 5-325 MG PO TABS: 1.0000 | ORAL_TABLET | Freq: Four times a day (QID) | ORAL | 0 refills | Status: DC | PRN

## 2021-08-20 MED ORDER — OXYCODONE-ACETAMINOPHEN 5-325 MG PO TABS
1.0000 | ORAL_TABLET | Freq: Once | ORAL | Status: AC
  Administered 2021-08-20: 1 via ORAL
  Filled 2021-08-20: qty 1

## 2021-08-20 MED ORDER — CEPHALEXIN 500 MG PO CAPS: 500.0000 mg | ORAL_CAPSULE | Freq: Four times a day (QID) | ORAL | 0 refills | Status: DC

## 2021-08-20 NOTE — ED Provider Notes (Signed)
?[facility] DEPT ?Provider Note ? ? ?CSN: [id number] ?Arrival date & time: 08/20/21  0849 ? ?  ? ?History ? ?Chief Complaint  ?Patient presents with  ? Back Pain  ? urinary issues  ? ? ?Melissa Case is a 61 y.o. female. ? ?Patient c/o low back pain after fall a few days ago. Lower back, moderate-severe, dull, non radiating. Denies saddle area numbness. No leg numbness or weakness. Denies problems w urinary retention, or incontinence, or stool incontinence. Was seen in ED 3/29 - had cts neg for acute process then. Denies headache. No neck or upper back pain. No chest pain or sob. No abd pain or nv. Reports of ?malodorous urine - pt denies dysuria. No fever or chills.  ? ?The history is provided by the patient, medical records and the EMS personnel.  ?Back Pain ?Associated symptoms: no abdominal pain, no chest pain, no dysuria, no fever, no headaches, no numbness and no weakness   ? ?  ? ?Home Medications ?Prior to Admission medications   ?Medication Sig Start Date End Date Taking? Authorizing Provider  ?Acetaminophen (TYLENOL ARTHRITIS PAIN PO) Take 2 tablets by mouth as needed.    [provider]  ?apixaban (ELIQUIS) 5 MG TABS tablet Take 1 tablet (5 mg total) by mouth 2 (two) times daily. 08/12/21   Maryjane Hurter, MD  ?calcium carbonate (TUMS - DOSED IN MG ELEMENTAL CALCIUM) 500 MG chewable tablet Chew 1 tablet by mouth as needed for indigestion or heartburn. Takes for breakthrough heartburn    [provider]  ?dexamethasone (DECADRON) 2 MG tablet Take 2 mg by mouth 3 (three) times daily.    [provider]  ?divalproex (DEPAKOTE) 250 MG DR tablet Take 1 tablet (250 mg total) by mouth 2 (two) times daily. (In addition to '500mg'$  twice daily for a total dosage of '750mg'$  BID). 08/08/21   Alric Ran, MD  ?divalproex (DEPAKOTE) 500 MG DR tablet Take 1 tablet (500 mg total) by mouth 2 (two) times daily. 08/08/21   Alric Ran, MD  ?docusate sodium (COLACE)  100 MG capsule Take 1 capsule (100 mg total) by mouth daily as needed for up to 30 doses for mild constipation. To prevent constipation while taking oxycodone 06/20/21   Wyvonnia Dusky, MD  ?esomeprazole (NEXIUM) 40 MG capsule Take 1 capsule (40 mg total) by mouth 2 (two) times daily before a meal. 08/15/21   Thornton Park, MD  ?furosemide (LASIX) 40 MG tablet Take 1 tablet (40 mg total) by mouth daily. 07/27/21   Veryl Speak, MD  ?levETIRAcetam (KEPPRA) 500 MG tablet Take 1 tablet (500 mg total) by mouth 2 (two) times daily. 08/08/21   Alric Ran, MD  ?ondansetron (ZOFRAN-ODT) 4 MG disintegrating tablet Take 4 mg by mouth every 8 (eight) hours as needed for nausea or vomiting.    [provider]  ?PARoxetine (PAXIL) 40 MG tablet Take 1 tablet (40 mg total) by mouth daily. 07/01/21   Arfeen, Arlyce Harman, MD  ?traZODone (DESYREL) 50 MG tablet Take 0.5 tablets (25 mg total) by mouth at bedtime. ?Patient taking differently: Take 25 mg by mouth at bedtime as needed. 07/15/21   Arfeen, Arlyce Harman, MD  ?umeclidinium-vilanterol (ANORO ELLIPTA) 62.5-25 MCG/ACT AEPB Inhale 1 puff into the lungs daily. 08/12/21   Maryjane Hurter, MD  ?   ? ?Allergies    ?Morphine and Penicillins   ? ?Review of Systems   ?Review of Systems  ?Constitutional:  Negative for chills and  fever.  ?HENT:  Negative for sore throat.   ?Eyes:  Negative for pain, redness and visual disturbance.  ?Respiratory:  Negative for shortness of breath.   ?Cardiovascular:  Negative for chest pain.  ?Gastrointestinal:  Negative for abdominal pain and vomiting.  ?Genitourinary:  Negative for dysuria.  ?Musculoskeletal:  Positive for back pain. Negative for neck pain.  ?Skin:  Negative for rash.  ?Neurological:  Negative for weakness, numbness and headaches.  ?Hematological:  Does not bruise/bleed easily.  ?Psychiatric/Behavioral:  Negative for agitation.   ? ?Physical Exam ?Updated Vital Signs ?BP (!) 150/73 (BP Location: Left Arm)   Pulse 73   Temp 97.7 ?F  (36.5 ?C)   Resp 18   SpO2 95%  ?Physical Exam ?Vitals and nursing note reviewed.  ?Constitutional:   ?   Appearance: Normal appearance. She is well-developed.  ?HENT:  ?   Head: Atraumatic.  ?   Nose: Nose normal.  ?   Mouth/Throat:  ?   Mouth: Mucous membranes are moist.  ?Eyes:  ?   General: No scleral icterus. ?   Conjunctiva/sclera: Conjunctivae normal.  ?   Pupils: Pupils are equal, round, and reactive to light.  ?Neck:  ?   Vascular: No carotid bruit.  ?   Trachea: No tracheal deviation.  ?   Comments: No stiffness or rigidity, normal movement without pain.  ?Cardiovascular:  ?   Rate and Rhythm: Normal rate and regular rhythm.  ?   Pulses: Normal pulses.  ?   Heart sounds: Normal heart sounds. No murmur heard. ?  No friction rub. No gallop.  ?Pulmonary:  ?   Effort: Pulmonary effort is normal. No respiratory distress.  ?   Breath sounds: Normal breath sounds.  ?Abdominal:  ?   General: Bowel sounds are normal. There is no distension.  ?   Palpations: Abdomen is soft.  ?   Tenderness: There is no abdominal tenderness.  ?Genitourinary: ?   Comments: No cva tenderness.  ?Musculoskeletal:     ?   General: No swelling or tenderness.  ?   Cervical back: Normal range of motion and neck supple. No rigidity or tenderness. No muscular tenderness.  ?   Right lower leg: No edema.  ?   Left lower leg: No edema.  ?   Comments: Lower lumbar tenderness, otherwise, CTLS spine, non tender, aligned, no step off. ?Good rom bil extremities without pain or focal bony tenderness.   ?Skin: ?   General: Skin is warm and dry.  ?   Findings: No rash.  ?Neurological:  ?   Mental Status: She is alert.  ?   Comments: Alert, speech normal. Motor intact bil, stre 5/5. Sens grossly intact bil.   ?Psychiatric:     ?   Mood and Affect: Mood normal.  ? ? ?ED Results / Procedures / Treatments   ?Labs ?(all labs ordered are listed, but only abnormal results are displayed) ?Results for orders placed or performed during the hospital encounter of  08/20/21  ?CBC  ?Result Value Ref Range  ? WBC 6.0 4.0 - 10.5 K/uL  ? RBC 3.44 (L) 3.87 - 5.11 MIL/uL  ? Hemoglobin 11.2 (L) 12.0 - 15.0 g/dL  ? HCT 34.7 (L) 36.0 - 46.0 %  ? MCV 100.9 (H) 80.0 - 100.0 fL  ? MCH 32.6 26.0 - 34.0 pg  ? MCHC 32.3 30.0 - 36.0 g/dL  ? RDW 15.2 11.5 - 15.5 %  ? Platelets 207 150 - 400 K/uL  ?  nRBC 0.0 0.0 - 0.2 %  ? ? ? ?EKG ?None ? ?Radiology ?CT Lumbar Spine Wo Contrast ? ?Result Date: 08/20/2021 ?CLINICAL DATA:  61 year old female status post fall 2 weeks ago with continued back pain. EXAM: CT LUMBAR SPINE WITHOUT CONTRAST TECHNIQUE: Multidetector CT imaging of the lumbar spine was performed without intravenous contrast administration. Multiplanar CT image reconstructions were also generated. RADIATION DOSE REDUCTION: This exam was performed according to the departmental dose-optimization program which includes automated exposure control, adjustment of the mA and/or kV according to patient size and/or use of iterative reconstruction technique. COMPARISON:  CT Abdomen and Pelvis 08/17/2021, 07/12/2021. FINDINGS: Segmentation: Normal. Alignment: Stable lumbar lordosis since February. Subtle grade 1 anterolisthesis of L4 on L5. Vertebrae: Diffuse osteopenia. Visible posterior ribs appear intact. No convincing visible lower thoracic vertebral compression fracture. Subtle superior endplate compression of L5 centrally when compared to the recent CTs. This might be an acute Schmorl's node or impending L5 compression fracture. Elsewhere stable lumbar vertebral height and alignment since February. Intact visible sacrum and SI joints. Paraspinal and other soft tissues: Stable cavitary lesion in the right costophrenic angle. Stable visible abdominal viscera from the CT Abdomen and Pelvis 3 days ago. Aortoiliac calcified atherosclerosis. Negative lumbar paraspinal soft tissues. Disc levels: No CT evidence of lower thoracic or upper lumbar spinal stenosis. Dominant degenerative changes heart L4-L5  where grade 1 anterolisthesis is associated with severe bilateral facet hypertrophy and circumferential disc/pseudo disc. Combined with epidural lipomatosis there is moderate to severe spinal stenosis (s

## 2021-08-20 NOTE — ED Provider Triage Note (Signed)
Emergency Medicine Provider Triage Evaluation Note ? ?Melissa Case , a 61 y.o. female  was evaluated in triage.  Pt complains of low back pain.  Patient states that pain began last night and is progressed to this morning.  She describes it as constant, midline.  She denies numbness or tingling to her lower extremities, weakness, saddle anesthesia, incontinence/retention of bowel or bladder, IV drug use or fevers.  She does endorse fall 2 weeks ago.  She denies history of chronic low back pain or previous back surgeries.  Walks with a walker at baseline. ?There is a note from triage RN states patient has noticed foul-smelling urine for the past week.  Patient denies dysuria or urinary symptoms to me.  Also denies abdominal pain. ?Review of Systems  ?Positive: See above ?Negative:  ? ?Physical Exam  ?BP (!) 150/73 (BP Location: Left Arm)   Pulse 73   Temp 97.7 ?F (36.5 ?C)   Resp 18   SpO2 95%  ?Gen:   Awake, no distress   ?Resp:  Normal effort  ?MSK:   Moves extremities without difficulty  ?Other:  Midline L-spine tenderness to palpation.  No step-offs or deformities.  Strength equal in bilateral lower extremities.  Sensation intact. ? ?Medical Decision Making  ?Medically screening exam initiated at 9:15 AM.  Appropriate orders placed.  Melissa Case was informed that the remainder of the evaluation will be completed by another provider, this initial triage assessment does not replace that evaluation, and the importance of remaining in the ED until their evaluation is complete. ? ? ?  ?Mickie Hillier, PA-C ?08/20/21 [phone] ? ?

## 2021-08-20 NOTE — ED Triage Notes (Signed)
BIB EMS from home, reports middle back pain per EMS, denies recent falls or trauma. Pt has noticed foul-smelling urine for about the past week and darker in color. Denies dysuria.  ? ?

## 2021-08-20 NOTE — Discharge Instructions (Addendum)
It was our pleasure to provide your ER care today - we hope that you feel better. ? ?Drink plenty of fluids/stay well hydrated.  ? ?Fall precautions - use great care, and walker, to help minimize risk of falling and injury. ? ?You imaging shows a L5 compression fracture - rest, avoid bending at waist, or heavy lifting > 10 lbs, find position of comfort, take meds as need, as prescribed, and follow up with your doctor in the next 1-2 weeks.  ? ?Return to ER if worse, new symptoms, fevers, numbness/weakness, chest pain, trouble breathing, or other concern.  ?

## 2021-08-20 NOTE — ED Provider Notes (Signed)
Signout from Dr. Ashok Cordia.  61 year old female here with low back pain and urinary symptoms.  Imaging showing compression fracture.  Plan is to follow-up on labs and urinalysis.  Plus minus treatment for UTI. ?Physical Exam  ?BP (!) 154/69   Pulse 73   Temp 97.7 ?F (36.5 ?C)   Resp 19   SpO2 93%  ? ?Physical Exam ? ?Procedures  ?Procedures ? ?ED Course / MDM  ?  ?Medical Decision Making ?Amount and/or Complexity of Data Reviewed ?Labs: ordered. ? ?Risk ?Prescription drug management. ? ? ?Urine concerning for UTI with nitrite positive and 21-50 white blood cells.  Will cover with Keflex.  Penicillin allergic with rash.  We will also prescribe hydrocodone at patient's request.  Side effects and abuse potential discussed with patient and husband.  Recommended close follow-up with PCP.  Return instructions discussed ? ? ? ? ?  ?Hayden Rasmussen, MD ?08/20/21 1637 ? ?

## 2021-08-20 NOTE — ED Notes (Signed)
Pt ambulatory w/ walker to restroom.  ?

## 2021-08-20 NOTE — ED Notes (Signed)
Attempted IV on pt x2.  ?

## 2021-08-26 ENCOUNTER — Encounter (HOSPITAL_COMMUNITY): Payer: Self-pay | Admitting: Radiology

## 2021-09-02 ENCOUNTER — Telehealth (HOSPITAL_COMMUNITY): Payer: Self-pay | Admitting: Student

## 2021-09-02 NOTE — Telephone Encounter (Signed)
Just an FYI. ?We have made several attempts to contact this patient including sending a letter to schedule or reschedule their echocardiogram. We will be removing the patient from the echo WQ. ? ?08/26/21'@1051'$  Letter mailed evd  ?08/19/21 [facility] to schedule @ 9:41/LBW  ?08/16/21 [facility] to schedule @ 10am/LBW  ? ? ? ? ? ?Thank you  ?

## 2021-09-12 ENCOUNTER — Emergency Department (HOSPITAL_COMMUNITY): Payer: BC Managed Care – PPO

## 2021-09-12 ENCOUNTER — Other Ambulatory Visit: Payer: Self-pay

## 2021-09-12 ENCOUNTER — Encounter (HOSPITAL_COMMUNITY): Payer: Self-pay

## 2021-09-12 ENCOUNTER — Emergency Department (HOSPITAL_COMMUNITY)
Admission: EM | Admit: 2021-09-12 | Discharge: 2021-09-13 | Disposition: A | Discharge status: Home / Self Care | Payer: BC Managed Care – PPO | Attending: Emergency Medicine | Admitting: Emergency Medicine

## 2021-09-12 DIAGNOSIS — J811 Chronic pulmonary edema: Secondary | ICD-10-CM | POA: Diagnosis not present

## 2021-09-12 DIAGNOSIS — Z7901 Long term (current) use of anticoagulants: Secondary | ICD-10-CM | POA: Diagnosis not present

## 2021-09-12 DIAGNOSIS — R0602 Shortness of breath: Secondary | ICD-10-CM | POA: Diagnosis not present

## 2021-09-12 DIAGNOSIS — R600 Localized edema: Secondary | ICD-10-CM | POA: Insufficient documentation

## 2021-09-12 LAB — BASIC METABOLIC PANEL
Anion gap: 8 (ref 5–15)
BUN: 15 mg/dL (ref 6–20)
CO2: 28 mmol/L (ref 22–32)
Calcium: 8.7 mg/dL — ABNORMAL LOW (ref 8.9–10.3)
Chloride: 102 mmol/L (ref 98–111)
Creatinine, Ser: 0.51 mg/dL (ref 0.44–1.00)
GFR, Estimated: >60 mL/min (ref >60)
Glucose, Bld: 139 mg/dL — ABNORMAL HIGH (ref 70–99)
Potassium: 4.4 mmol/L (ref 3.5–5.1)
Sodium: 138 mmol/L (ref 135–145)

## 2021-09-12 LAB — CBC
HCT: 36.3 % (ref 36.0–46.0)
Hemoglobin: 11.6 g/dL — ABNORMAL LOW (ref 12.0–15.0)
MCH: 32 pg (ref 26.0–34.0)
MCHC: 32 g/dL (ref 30.0–36.0)
MCV: 100.3 fL — ABNORMAL HIGH (ref 80.0–100.0)
Platelets: 236 10*3/uL (ref 150–400)
RBC: 3.62 MIL/uL — ABNORMAL LOW (ref 3.87–5.11)
RDW: 15.6 % — ABNORMAL HIGH (ref 11.5–15.5)
WBC: 7.1 10*3/uL (ref 4.0–10.5)
nRBC: 0 % (ref 0.0–0.2)

## 2021-09-12 LAB — BRAIN NATRIURETIC PEPTIDE: B Natriuretic Peptide: 36.3 pg/mL (ref 0.0–100.0)

## 2021-09-12 NOTE — ED Triage Notes (Signed)
Pt has some right leg weeping. (Clear fluid). Pt reports taking a blood thinner and is concerned.  ?

## 2021-09-12 NOTE — ED Provider Triage Note (Signed)
Emergency Medicine Provider Triage Evaluation Note ? ?Melissa Case , a 61 y.o. female  was evaluated in triage.  Pt complains of lower extremity swelling.  Has had weeping to right lower extremity however feels like bilateral lower extremities are swollen.  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  Had some shortness of breath which resolved.  She thought this was due to a "cold."  No recent traumatic injuries to lower extremities.  She is unsure if she has history of CHF however person in room states she has been on Lasix previously however not currently.  No fever, redness to extremities ? ?Review of Systems  ?Positive: LE swelling ?Negative: Fever, CP, back pain ? ?Physical Exam  ?BP 138/78 (BP Location: Right Arm)   Pulse 92   Temp 98.6 ?F (37 ?C) (Oral)   Resp 16   Ht '5\' 4"'$  (1.626 m)   Wt 124.7 kg   SpO2 94%   BMI 47.20 kg/m?  ?Gen:   Awake, no distress   ?Head:  Facial droop chronic ?Resp:  Normal effort  ?MSK:   Moves extremities without difficulty, pitting edema to BLE. No erythema, warmth, clear drainage to lateral aspect RLE ?Other:   ? ?Medical Decision Making  ?Medically screening exam initiated at 9:46 PM.  Appropriate orders placed.  Melissa Case was informed that the remainder of the evaluation will be completed by another provider, this initial triage assessment does not replace that evaluation, and the importance of remaining in the ED until their evaluation is complete. ? ?LE edema ?  ?Alcides Nutting A, PA-C ?09/12/21 2149 ? ?

## 2021-09-13 MED ORDER — DOXYCYCLINE HYCLATE 100 MG PO CAPS: 100.0000 mg | ORAL_CAPSULE | Freq: Two times a day (BID) | ORAL | 0 refills | Status: DC

## 2021-09-13 MED ORDER — DOXYCYCLINE HYCLATE 100 MG PO TABS
100.0000 mg | ORAL_TABLET | Freq: Once | ORAL | Status: AC
  Administered 2021-09-13: 100 mg via ORAL
  Filled 2021-09-13: qty 1

## 2021-09-13 NOTE — ED Notes (Signed)
ABD pain and kerlex gauze applied to RLE. ? ?Discharge instructions reviewed, questions answered. Rx education provided. Pt states understanding and no further questions. Pt wheeled out in personal wheelchair by husband upon discharge. No s/s of distress noted. ?

## 2021-09-13 NOTE — ED Provider Notes (Signed)
?[facility] DEPT ?Provider Note ? ? ?CSN: [id number] ?Arrival date & time: 09/12/21  2102 ? ?  ? ?History ? ?Chief Complaint  ?Patient presents with  ? weeping  ? ? ?Melissa Case is a 61 y.o. female. ? ?The history is provided by the patient and medical records.  ?Melissa Case is a 61 y.o. female who presents to the Emergency Department complaining of weeping.  She has a history of DVT/PE on chronic anticoagulation as well as chronic lymphedema.  She presents to the emergency department for evaluation of weeping from the right lower extremity that started today.  She thinks that her dog may have scratched it with her nails but she is not quite sure.  She states the swelling is at her baseline but the weeping is new.  No associated fevers, chest pain, difficulty breathing, nausea, vomiting. ?  ? ?Home Medications ?Prior to Admission medications   ?Medication Sig Start Date End Date Taking? Authorizing Provider  ?Acetaminophen (TYLENOL ARTHRITIS PAIN PO) Take 2 tablets by mouth as needed.    [provider]  ?apixaban (ELIQUIS) 5 MG TABS tablet Take 1 tablet (5 mg total) by mouth 2 (two) times daily. 08/12/21   Maryjane Hurter, MD  ?calcium carbonate (TUMS - DOSED IN MG ELEMENTAL CALCIUM) 500 MG chewable tablet Chew 1 tablet by mouth as needed for indigestion or heartburn. Takes for breakthrough heartburn    [provider]  ?cephALEXin (KEFLEX) 500 MG capsule Take 1 capsule (500 mg total) by mouth 4 (four) times daily. 08/20/21   Hayden Rasmussen, MD  ?dexamethasone (DECADRON) 2 MG tablet Take 2 mg by mouth 3 (three) times daily.    [provider]  ?divalproex (DEPAKOTE) 250 MG DR tablet Take 1 tablet (250 mg total) by mouth 2 (two) times daily. (In addition to '500mg'$  twice daily for a total dosage of '750mg'$  BID). 08/08/21   Alric Ran, MD  ?divalproex (DEPAKOTE) 500 MG DR tablet Take 1 tablet (500 mg total) by mouth 2 (two) times daily. 08/08/21   Alric Ran, MD  ?docusate sodium (COLACE) 100 MG capsule Take 1 capsule (100 mg total) by mouth daily as needed for up to 30 doses for mild constipation. To prevent constipation while taking oxycodone 06/20/21   Wyvonnia Dusky, MD  ?esomeprazole (NEXIUM) 40 MG capsule Take 1 capsule (40 mg total) by mouth 2 (two) times daily before a meal. 08/15/21   Thornton Park, MD  ?furosemide (LASIX) 40 MG tablet Take 1 tablet (40 mg total) by mouth daily. 07/27/21   Veryl Speak, MD  ?HYDROcodone-acetaminophen (NORCO/VICODIN) 5-325 MG tablet Take 1 tablet by mouth every 6 (six) hours as needed. 08/20/21   Hayden Rasmussen, MD  ?levETIRAcetam (KEPPRA) 500 MG tablet Take 1 tablet (500 mg total) by mouth 2 (two) times daily. 08/08/21   Alric Ran, MD  ?ondansetron (ZOFRAN-ODT) 4 MG disintegrating tablet Take 4 mg by mouth every 8 (eight) hours as needed for nausea or vomiting.    [provider]  ?PARoxetine (PAXIL) 40 MG tablet Take 1 tablet (40 mg total) by mouth daily. 07/01/21   Arfeen, Arlyce Harman, MD  ?traZODone (DESYREL) 50 MG tablet Take 0.5 tablets (25 mg total) by mouth at bedtime. ?Patient taking differently: Take 25 mg by mouth at bedtime as needed. 07/15/21   Arfeen, Arlyce Harman, MD  ?umeclidinium-vilanterol (ANORO ELLIPTA) 62.5-25 MCG/ACT AEPB Inhale 1 puff into the lungs daily. 08/12/21   Maryjane Hurter, MD  ?   ? ?  Allergies    ?Morphine and Penicillins   ? ?Review of Systems   ?Review of Systems  ?All other systems reviewed and are negative. ? ?Physical Exam ?Updated Vital Signs ?BP (!) 143/82 (BP Location: Right Arm)   Pulse 89   Temp 98.6 ?F (37 ?C) (Oral)   Resp 16   Ht '5\' 4"'$  (1.626 m)   Wt 124.7 kg   SpO2 95%   BMI 47.20 kg/m?  ?Physical Exam ?Vitals and nursing note reviewed.  ?Constitutional:   ?   Appearance: She is well-developed.  ?HENT:  ?   Head: Normocephalic and atraumatic.  ?Cardiovascular:  ?   Rate and Rhythm: Normal rate and regular rhythm.  ?   Heart sounds: No murmur heard. ?Pulmonary:   ?   Effort: Pulmonary effort is normal. No respiratory distress.  ?   Breath sounds: Normal breath sounds.  ?Abdominal:  ?   Palpations: Abdomen is soft.  ?   Tenderness: There is no abdominal tenderness. There is no guarding or rebound.  ?Musculoskeletal:     ?   General: No tenderness.  ?   Comments: 3-4+ pitting edema to bilateral lower extremities.  There are 4 separate linear areas of weeping from the right lower extremity without any surrounding erythema.  Palpable pedal pulses bilaterally.  ?Skin: ?   General: Skin is warm and dry.  ?Neurological:  ?   Mental Status: She is alert and oriented to person, place, and time.  ?Psychiatric:     ?   Behavior: Behavior normal.  ? ? ?ED Results / Procedures / Treatments   ?Labs ?(all labs ordered are listed, but only abnormal results are displayed) ?Labs Reviewed  ?CBC - Abnormal; Notable for the following components:  ?    Result Value  ? RBC 3.62 (*)   ? Hemoglobin 11.6 (*)   ? MCV 100.3 (*)   ? RDW 15.6 (*)   ? All other components within normal limits  ?BASIC METABOLIC PANEL - Abnormal; Notable for the following components:  ? Glucose, Bld 139 (*)   ? Calcium 8.7 (*)   ? All other components within normal limits  ?BRAIN NATRIURETIC PEPTIDE  ? ? ?EKG ?EKG Interpretation ? ?Date/Time:  Tuesday September 13 2021 00:45:55 EDT ?Ventricular Rate:  74 ?PR Interval:  159 ?QRS Duration: 86 ?QT Interval:  386 ?QTC Calculation: 429 ?R Axis:   2 ?Text Interpretation: Sinus rhythm Low voltage, precordial leads Confirmed by Quintella Reichert [phone]) on 09/13/2021 3:37:52 AM ? ?Radiology ?DG Chest 2 View ? ?Result Date: 09/12/2021 ?CLINICAL DATA:  Lower extremity swelling, shortness of breath. EXAM: CHEST - 2 VIEW COMPARISON:  07/12/2021, 07/25/2021. FINDINGS: The heart is enlarged the mediastinal contour is stable. Atherosclerotic calcification of the aorta is noted. The pulmonary vasculature is distended. Mild subsegmental atelectasis and/or scarring is noted bilaterally. No  consolidation, effusion, or pneumothorax. A 2.3 cm nodular density is present in the right lower lobe corresponding to previously described lesion. Degenerative changes are present in the thoracic spine. IMPRESSION: 1. Cardiomegaly with pulmonary vascular congestion. 2. Mild subsegmental atelectasis or scarring bilaterally. 3. 2.3 cm nodular density is noted in the right lower lobe which corresponds to previously described lesion on prior CT. Short-term follow-up chest CT is recommended. Electronically Signed   By: Brett Fairy M.D.   On: 09/12/2021 22:18   ? ?Procedures ?Procedures  ? ? ?Medications Ordered in ED ?Medications - No data to display ? ?ED Course/ Medical Decision Making/ A&P ?  ?                        ?  Medical Decision Making ?Risk ?Prescription drug management. ? ? ?Patient here for evaluation of right lower extremity weeping, has a history of DVT on anticoagulation, chronic lymphedema.  On examination she has several linear areas of weeping on the lateral aspect of the right leg.  There are no secondary signs of infection.  She is well-perfused distally.  The legs are weeping serous fluid.  Patient does think she was maybe scratched by her dog.  There is no current evidence of infection but given this may have been a dog scratch will start empiric antibiotics.  Will apply dressing to these areas.  Discussed with patient is very important for her to follow-up with PCP.  She has been previously referred to lymphedema clinic-recommend she follow-up there.  Close return precautions discussed regarding developing infection. ? ?Current clinical picture is not consistent with decompensated CHF, infection, limb ischemia, renal failure. ? ? ? ? ? ? ?Final Clinical Impression(s) / ED Diagnoses ?Final diagnoses:  ?Lower extremity edema  ? ? ?Rx / DC Orders ?ED Discharge Orders   ? ? None  ? ?  ? ? ?  ?Quintella Reichert, MD ?09/13/21 0408 ? ?

## 2021-09-16 DIAGNOSIS — I6789 Other cerebrovascular disease: Secondary | ICD-10-CM | POA: Diagnosis not present

## 2021-09-20 DIAGNOSIS — Z0271 Encounter for disability determination: Secondary | ICD-10-CM

## 2021-09-21 ENCOUNTER — Other Ambulatory Visit: Payer: Self-pay | Admitting: Neurological Surgery

## 2021-09-21 ENCOUNTER — Other Ambulatory Visit (HOSPITAL_COMMUNITY): Payer: Self-pay | Admitting: Neurological Surgery

## 2021-09-21 DIAGNOSIS — Y842 Radiological procedure and radiotherapy as the cause of abnormal reaction of the patient, or of later complication, without mention of misadventure at the time of the procedure: Secondary | ICD-10-CM

## 2021-09-23 ENCOUNTER — Other Ambulatory Visit: Payer: Self-pay | Admitting: Family

## 2021-09-23 ENCOUNTER — Ambulatory Visit
Admission: RE | Admit: 2021-09-23 | Discharge: 2021-09-23 | Disposition: A | Discharge status: Home / Self Care | Payer: BC Managed Care – PPO | Source: Ambulatory Visit | Attending: Family | Admitting: Family

## 2021-09-23 DIAGNOSIS — E042 Nontoxic multinodular goiter: Secondary | ICD-10-CM

## 2021-09-23 DIAGNOSIS — E041 Nontoxic single thyroid nodule: Secondary | ICD-10-CM

## 2021-09-23 NOTE — Progress Notes (Signed)
Keep all scheduled appointments with Endocrinology for further evaluation and management of thyroid nodule. New referral placed. Expect call with appointment details soon.

## 2021-09-30 ENCOUNTER — Telehealth (HOSPITAL_BASED_OUTPATIENT_CLINIC_OR_DEPARTMENT_OTHER): Payer: BC Managed Care – PPO | Admitting: Psychiatry

## 2021-09-30 ENCOUNTER — Encounter (HOSPITAL_COMMUNITY): Payer: Self-pay | Admitting: Psychiatry

## 2021-09-30 DIAGNOSIS — F419 Anxiety disorder, unspecified: Secondary | ICD-10-CM

## 2021-09-30 DIAGNOSIS — F330 Major depressive disorder, recurrent, mild: Secondary | ICD-10-CM

## 2021-09-30 MED ORDER — PAROXETINE HCL 40 MG PO TABS: 40.0000 mg | ORAL_TABLET | Freq: Every day | ORAL | 0 refills | Status: AC

## 2021-09-30 NOTE — Progress Notes (Signed)
Virtual Visit via Telephone Note ? ?I connected with Melissa Case on 09/30/21 at 10:00 AM EDT by telephone and verified that I am speaking with the correct person using two identifiers. ? ?Location: ?Patient: Home ?Provider: Home Office ?  ?I discussed the limitations, risks, security and privacy concerns of performing an evaluation and management service by telephone and the availability of in person appointments. I also discussed with the patient that there may be a patient responsible charge related to this service. The patient expressed understanding and agreed to proceed. ? ? ?History of Present Illness: ?Patient is evaluated by phone session.  Her husband [name] was also present in the session.  Since the last visit patient is in the hospital a few times for chronic health issues.  She still have memory impairment especially short-term.  She does not remember very well and try to verify every answer from her husband.  She is sleeping much better without any sleep aid.  We have prescribed trazodone but as per husband she does not need it.  Her neurologist trying to wean her off slowly and gradually from steroids.  Patient denies any panic attack or any crying spells.  She does not feel depressed but wants to get better from her physical issues.  She is pleased that husband is very supportive and her children comes and visit her.  She denies any tremors, shakes.  She denies any hallucination, paranoia, suicidal thoughts.  She denies any delusions.  She understand that she is taking Paxil which is helping her depression and she like to keep the current medication.  Her husband helps her for her daily routines.  Patient understand that she need to walk every day and she is trying with the help of her husband. ? ? ?Past Psychiatric History:  ?H/O anxiety and depression. No h/o inpatient treatment, suicidal attempt, or psychosis. Trazodone worked.  ?   ?Psychiatric Specialty Exam: ?Physical Exam  ?Review of  Systems  ?Weight 275 lb (124.7 kg).There is no height or weight on file to calculate BMI.  ?General Appearance: NA  ?Eye Contact:  NA  ?Speech:  Slow  ?Volume:  Decreased  ?Mood:  Anxious  ?Affect:  NA  ?Thought Process:  Descriptions of Associations: Loose  ?Orientation:  Full (Time, Place, and Person)  ?Thought Content:  Rumination  ?Suicidal Thoughts:  No  ?Homicidal Thoughts:  No  ?Memory:  Immediate;   Fair ?Recent;   Fair ?Remote;   Fair  ?Judgement:  Fair  ?Insight:  Shallow  ?Psychomotor Activity:  NA  ?Concentration:  Concentration: Fair and Attention Span: Fair  ?Recall:  Fair  ?Fund of Knowledge:  Fair  ?Language:  Fair  ?Akathisia:  No  ?Handed:  Right  ?AIMS (if indicated):     ?Assets:  Desire for Improvement ?Housing ?Social Support  ?ADL's:  Intact  ?Cognition:  Impaired,  Mild  ?Sleep:   better  ? ? ? ? ?Assessment and Plan: ?Major depressive disorder, recurrent.  Anxiety. ? ?Patient is stable on Paxil 40 mg daily.  She is on steroids and hoping to come off slowly and gradually.  She is on Keppra and Depakote to help her seizures.  Discussed medication side effects and benefits.  Recommended to call us back if she has any question or any concern.  Follow-up in 3 months. ? ?Follow Up Instructions: ? ?  ?I discussed the assessment and treatment plan with the patient. The patient was provided an opportunity to ask questions and all were  answered. The patient agreed with the plan and demonstrated an understanding of the instructions. ?  ?The patient was advised to call back or seek an in-person evaluation if the symptoms worsen or if the condition fails to improve as anticipated. ? ?Collaboration of Care: Other provider involved in patient's care AEB notes are available in epic to review. ? ?Patient/Guardian was advised Release of Information must be obtained prior to any record release in order to collaborate their care with an outside provider. Patient/Guardian was advised if they have not already  done so to contact the registration department to sign all necessary forms in order for [place] to release information regarding their care.  ? ?Consent: Patient/Guardian gives verbal consent for treatment and assignment of benefits for services provided during this visit. Patient/Guardian expressed understanding and agreed to proceed.   ? ?I provided 14 minutes of non-face-to-face time during this encounter. ? ? ?Kathlee Nations, MD  ?

## 2021-10-02 ENCOUNTER — Encounter: Payer: Self-pay | Admitting: Emergency Medicine

## 2021-10-02 ENCOUNTER — Other Ambulatory Visit: Payer: Self-pay

## 2021-10-02 ENCOUNTER — Ambulatory Visit
Admission: EM | Admit: 2021-10-02 | Discharge: 2021-10-02 | Disposition: A | Discharge status: Home / Self Care | Payer: BC Managed Care – PPO | Attending: Urgent Care | Admitting: Urgent Care

## 2021-10-02 DIAGNOSIS — L03116 Cellulitis of left lower limb: Secondary | ICD-10-CM

## 2021-10-02 HISTORY — DX: Acute embolism and thrombosis of unspecified deep veins of unspecified lower extremity: I82.409

## 2021-10-02 MED ORDER — CEPHALEXIN 500 MG PO CAPS: 500.0000 mg | ORAL_CAPSULE | Freq: Four times a day (QID) | ORAL | 0 refills | Status: AC

## 2021-10-02 NOTE — Discharge Instructions (Addendum)
You are developing cellulitis, a skin infection. ?Please start taking the antibiotic four times daily. ?Please follow up with your PCP within 7 days for a recheck ?Please head to the ER if you develop a fever or if the redness spreads ?

## 2021-10-02 NOTE — ED Provider Notes (Signed)
?[facility] ? ? ? ?CSN: [id number] ?Arrival date & time: 10/02/21  7829 ? ? ?  ? ?History   ?Chief Complaint ?Chief Complaint  ?Patient presents with  ? Wound Check  ? ? ?HPI ?Melissa Case is a 61 y.o. female.  ? ?Pleasant 61yo female presents today with concerns for infection of her L leg. Has chronic edema but states that 3-4 days ago noticed the L ankle/ leg started turning red and warm. Pt's husband usually wraps her legs, and feels that it might have cut into the skin with the wrap. Pt denies any systemic symptoms including fever or severe myalgias. Has not tried any treatment.s ? ? ?Wound Check ? ? ?Past Medical History:  ?Diagnosis Date  ? Cancer [facility])   ? Partoid  ? Depression   ? DVT (deep venous thrombosis) ([place])   ? GERD (gastroesophageal reflux disease)   ? History of radiation to head and neck region 09/16/2019  ? 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  ? Panic attack   ? Seizures ([place])   ? ? ?Patient Active Problem List  ? Diagnosis Date Noted  ? Acute respiratory failure with hypoxia ([place])   ? Pulmonary emboli ([place]) 05/11/2021  ? DVT (deep venous thrombosis) ([place]) 05/10/2021  ? Prediabetes 03/04/2021  ? 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 Cerebral edema ([place]) 12/30/2020  ? Seizure ([place]) 11/17/2020  ? 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 History of radiation to head and neck region 09/16/2019  ? 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 Lesion of parotid gland 07/14/2019  ? Acute calculous cholecystitis 11/21/2017  ? Gastro-esophageal reflux disease without esophagitis 02/03/2014  ? Abdominal pain 09/30/2013  ? SBO (small bowel obstruction) ([place]) 09/30/2013  ? 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 Gall stone 04/05/2012  ? Barrett's esophagus 04/05/2012  ? Cholelithiasis 03/27/2012  ? Hyperlipidemia 03/25/2012  ? Depression 09/15/2011  ? 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 ? ?Past Surgical History:  ?Procedure  Laterality Date  ? ABDOMINAL HYSTERECTOMY    ? CHOLECYSTECTOMY N/A 11/21/2017  ? Procedure: LAPAROSCOPIC CHOLECYSTECTOMY WITH INTRAOPERATIVE CHOLANGIOGRAM;  Surgeon: Jovita Kussmaul, MD;  Location: [facility] ORS;  Service: General;  Laterality: N/A;  ? MULTIPLE EXTRACTIONS WITH ALVEOLOPLASTY N/A 08/28/2019  ? Procedure: Extraction of tooth #'s 3-7, 10-15, and 20-31 with alveoloplatsy, maxillary right buccal exostosis reduction, and bilateral mandibular tori reductions.;  Surgeon: Lenn Cal, DDS;  Location: [facility];  Service: Oral Surgery;  Laterality: N/A;  ? PAROTIDECTOMY Right 07/22/2019  ? with biopsy; done at [facility] by Dr. Fredricka Bonine  ? ? ?OB History   ?No obstetric history on file. ?  ? ? ? ?Home Medications   ? ?Prior to Admission medications   ?Medication Sig Start Date End Date Taking? Authorizing Provider  ?Acetaminophen (TYLENOL ARTHRITIS PAIN PO) Take 2 tablets by mouth as needed.    [provider]  ?apixaban (ELIQUIS) 5 MG TABS tablet Take 1 tablet (5 mg total) by mouth 2 (two) times daily. 08/12/21   Maryjane Hurter, MD  ?calcium carbonate (TUMS - DOSED IN MG ELEMENTAL CALCIUM) 500 MG chewable tablet Chew 1 tablet by mouth as needed for indigestion or heartburn. Takes for breakthrough heartburn    [provider]  ?cephALEXin (KEFLEX) 500 MG capsule Take 1 capsule (500 mg total) by mouth 4 (four) times daily for 7 days. 10/02/21 10/09/21  Chaney Malling, PA  ?  dexamethasone (DECADRON) 2 MG tablet Take 2 mg by mouth 3 (three) times daily.    [provider]  ?divalproex (DEPAKOTE) 250 MG DR tablet Take 1 tablet (250 mg total) by mouth 2 (two) times daily. (In addition to '500mg'$  twice daily for a total dosage of '750mg'$  BID). 08/08/21   Alric Ran, MD  ?divalproex (DEPAKOTE) 500 MG DR tablet Take 1 tablet (500 mg total) by mouth 2 (two) times daily. 08/08/21   Alric Ran, MD  ?docusate sodium (COLACE) 100 MG capsule Take 1 capsule (100 mg total) by mouth daily as needed  for up to 30 doses for mild constipation. To prevent constipation while taking oxycodone 06/20/21   Wyvonnia Dusky, MD  ?doxycycline (VIBRAMYCIN) 100 MG capsule Take 1 capsule (100 mg total) by mouth 2 (two) times daily. ?Patient not taking: Reported on 10/02/2021 09/13/21   Quintella Reichert, MD  ?esomeprazole (NEXIUM) 40 MG capsule Take 1 capsule (40 mg total) by mouth 2 (two) times daily before a meal. 08/15/21   Thornton Park, MD  ?furosemide (LASIX) 40 MG tablet Take 1 tablet (40 mg total) by mouth daily. 07/27/21   Veryl Speak, MD  ?HYDROcodone-acetaminophen (NORCO/VICODIN) 5-325 MG tablet Take 1 tablet by mouth every 6 (six) hours as needed. 08/20/21   Hayden Rasmussen, MD  ?levETIRAcetam (KEPPRA) 500 MG tablet Take 1 tablet (500 mg total) by mouth 2 (two) times daily. 08/08/21   Alric Ran, MD  ?ondansetron (ZOFRAN-ODT) 4 MG disintegrating tablet Take 4 mg by mouth every 8 (eight) hours as needed for nausea or vomiting.    [provider]  ?PARoxetine (PAXIL) 40 MG tablet Take 1 tablet (40 mg total) by mouth daily. 09/30/21   Arfeen, Arlyce Harman, MD  ?traZODone (DESYREL) 50 MG tablet Take 0.5 tablets (25 mg total) by mouth at bedtime. ?Patient not taking: Reported on 09/30/2021 07/15/21   Kathlee Nations, MD  ?umeclidinium-vilanterol [facility] ELLIPTA) 62.5-25 MCG/ACT AEPB Inhale 1 puff into the lungs daily. 08/12/21   Maryjane Hurter, MD  ? ? ?Family History ?Family History  ?Problem Relation Age of Onset  ? Depression Sister   ? Heart disease Mother   ? Diabetes Father   ? Breast cancer Paternal Grandmother   ? Colon cancer Neg Hx   ? Pancreatic cancer Neg Hx   ? Esophageal cancer Neg Hx   ? Stomach cancer Neg Hx   ? Rectal cancer Neg Hx   ? Liver cancer Neg Hx   ? ? ?Social History ?Social History  ? ?Tobacco Use  ? Smoking status: Former  ?  Packs/day: 0.50  ?  Years: 42.00  ?  Pack years: 21.00  ?  Types: Cigarettes  ?  Start date: 68  ?  Quit date: 01/16/2021  ?  Years since quitting: 0.7  ?   Passive exposure: Past  ? Smokeless tobacco: Never  ?Vaping Use  ? Vaping Use: Every day  ? Start date: 01/16/2021  ? Substances: Nicotine  ?Substance Use Topics  ? Alcohol use: No  ?  Alcohol/week: 0.0 standard drinks  ? Drug use: No  ? ? ? ?Allergies   ?Morphine and Penicillins ? ? ?Review of Systems ?Review of Systems ?As per hpi ? ?Physical Exam ?Triage Vital Signs ?ED Triage Vitals [10/02/21 0854]  ?Enc Vitals Group  ?   BP 120/71  ?   Pulse Rate 89  ?   Resp 18  ?   Temp 98 ?F (36.7 ?C)  ?  Temp Source Oral  ?   SpO2 92 %  ?   Weight   ?   Height   ?   Head Circumference   ?   Peak Flow   ?   Pain Score 0  ?   Pain Loc   ?   Pain Edu?   ?   Excl. in [place]?   ? ?No data found. ? ?Updated Vital Signs ?BP 120/71 (BP Location: Left Arm)   Pulse 89   Temp 98 ?F (36.7 ?C) (Oral)   Resp 18   SpO2 92%  ? ?Visual Acuity ?Right Eye Distance:   ?Left Eye Distance:   ?Bilateral Distance:   ? ?Right Eye Near:   ?Left Eye Near:    ?Bilateral Near:    ? ?Physical Exam ?Constitutional:   ?   Appearance: She is obese.  ?   Comments: Chronically ill, in wheelchair  ?Musculoskeletal:     ?   General: Swelling present.  ?   Right lower leg: Edema present.  ?   Left lower leg: Edema present.  ?   Comments: Bilateral legs with severe pitting edema, bilateral legs weeping ?L leg at level of ankle shows a developing cellulitis likely stemming from a small scratch to her lateral ankle. No discharge or drainage. ?No lymphangitis  ?Neurological:  ?   Mental Status: She is alert.  ? ? ? ?UC Treatments / Results  ?Labs ?(all labs ordered are listed, but only abnormal results are displayed) ?Labs Reviewed - No data to display ? ?EKG ? ? ?Radiology ?No results found. ? ?Procedures ?Procedures (including critical care time) ? ?Medications Ordered in UC ?Medications - No data to display ? ?Initial Impression / Assessment and Plan / UC Course  ?I have reviewed the triage vital signs and the nursing notes. ? ?Pertinent labs & imaging results  that were available during my care of the patient were reviewed by me and considered in my medical decision making (see chart for details). ? ?  ? ?Cellulitis - secondary to recent cut or abrasion to lower leg. Start

## 2021-10-02 NOTE — ED Triage Notes (Signed)
Pt here for left leg wound check from chronic edema; sts small yellow area and some drainage  ?

## 2021-10-07 ENCOUNTER — Emergency Department (HOSPITAL_COMMUNITY): Payer: BC Managed Care – PPO

## 2021-10-07 ENCOUNTER — Emergency Department (HOSPITAL_COMMUNITY)
Admission: EM | Admit: 2021-10-07 | Discharge: 2021-10-07 | Disposition: A | Discharge status: Home / Self Care | Payer: BC Managed Care – PPO | Attending: Student | Admitting: Student

## 2021-10-07 ENCOUNTER — Encounter (HOSPITAL_COMMUNITY): Payer: Self-pay

## 2021-10-07 ENCOUNTER — Ambulatory Visit: Admission: EM | Admit: 2021-10-07 | Discharge: 2021-10-07 | Payer: BC Managed Care – PPO

## 2021-10-07 ENCOUNTER — Ambulatory Visit (HOSPITAL_BASED_OUTPATIENT_CLINIC_OR_DEPARTMENT_OTHER): Payer: BC Managed Care – PPO

## 2021-10-07 ENCOUNTER — Other Ambulatory Visit: Payer: Self-pay

## 2021-10-07 DIAGNOSIS — I2699 Other pulmonary embolism without acute cor pulmonale: Secondary | ICD-10-CM | POA: Insufficient documentation

## 2021-10-07 DIAGNOSIS — I890 Lymphedema, not elsewhere classified: Secondary | ICD-10-CM | POA: Diagnosis not present

## 2021-10-07 DIAGNOSIS — R600 Localized edema: Secondary | ICD-10-CM | POA: Diagnosis not present

## 2021-10-07 DIAGNOSIS — Z7901 Long term (current) use of anticoagulants: Secondary | ICD-10-CM | POA: Insufficient documentation

## 2021-10-07 DIAGNOSIS — L03115 Cellulitis of right lower limb: Secondary | ICD-10-CM | POA: Diagnosis not present

## 2021-10-07 DIAGNOSIS — M7989 Other specified soft tissue disorders: Secondary | ICD-10-CM | POA: Diagnosis not present

## 2021-10-07 LAB — COMPREHENSIVE METABOLIC PANEL
ALT: 92 U/L — ABNORMAL HIGH (ref 0–44)
AST: 48 U/L — ABNORMAL HIGH (ref 15–41)
Albumin: 3 g/dL — ABNORMAL LOW (ref 3.5–5.0)
Alkaline Phosphatase: 71 U/L (ref 38–126)
Anion gap: 7 (ref 5–15)
BUN: 9 mg/dL (ref 6–20)
CO2: 32 mmol/L (ref 22–32)
Calcium: 8.6 mg/dL — ABNORMAL LOW (ref 8.9–10.3)
Chloride: 102 mmol/L (ref 98–111)
Creatinine, Ser: 0.58 mg/dL (ref 0.44–1.00)
GFR, Estimated: >60 mL/min (ref >60)
Glucose, Bld: 96 mg/dL (ref 70–99)
Potassium: 3.5 mmol/L (ref 3.5–5.1)
Sodium: 141 mmol/L (ref 135–145)
Total Bilirubin: 0.4 mg/dL (ref 0.3–1.2)
Total Protein: 6 g/dL — ABNORMAL LOW (ref 6.5–8.1)

## 2021-10-07 LAB — CBC WITH DIFFERENTIAL/PLATELET
Abs Immature Granulocytes: 0.31 10*3/uL — ABNORMAL HIGH (ref 0.00–0.07)
Basophils Absolute: 0.1 10*3/uL (ref 0.0–0.1)
Basophils Relative: 1 %
Eosinophils Absolute: 0 10*3/uL (ref 0.0–0.5)
Eosinophils Relative: 1 %
HCT: 39.2 % (ref 36.0–46.0)
Hemoglobin: 12.4 g/dL (ref 12.0–15.0)
Immature Granulocytes: 5 %
Lymphocytes Relative: 15 %
Lymphs Abs: 0.8 10*3/uL (ref 0.7–4.0)
MCH: 31.8 pg (ref 26.0–34.0)
MCHC: 31.6 g/dL (ref 30.0–36.0)
MCV: 100.5 fL — ABNORMAL HIGH (ref 80.0–100.0)
Monocytes Absolute: 0.6 10*3/uL (ref 0.1–1.0)
Monocytes Relative: 11 %
Neutro Abs: 3.9 10*3/uL (ref 1.7–7.7)
Neutrophils Relative %: 67 %
Platelets: 267 10*3/uL (ref 150–400)
RBC: 3.9 MIL/uL (ref 3.87–5.11)
RDW: 15.4 % (ref 11.5–15.5)
WBC: 5.8 10*3/uL (ref 4.0–10.5)
nRBC: 0 % (ref 0.0–0.2)

## 2021-10-07 LAB — ECHOCARDIOGRAM COMPLETE
Area-P 1/2: 3.91 cm2
S' Lateral: 2.4 cm

## 2021-10-07 LAB — LACTIC ACID, PLASMA: Lactic Acid, Venous: 2.3 mmol/L (ref 0.5–1.9)

## 2021-10-07 MED ORDER — POTASSIUM CHLORIDE CRYS ER 20 MEQ PO TBCR: 20.0000 meq | EXTENDED_RELEASE_TABLET | Freq: Two times a day (BID) | ORAL | 0 refills | Status: AC

## 2021-10-07 MED ORDER — FUROSEMIDE 40 MG PO TABS: 40.0000 mg | ORAL_TABLET | Freq: Two times a day (BID) | ORAL | 0 refills | Status: AC

## 2021-10-07 MED ORDER — FUROSEMIDE 10 MG/ML IJ SOLN
40.0000 mg | Freq: Once | INTRAMUSCULAR | Status: AC
  Administered 2021-10-07: 40 mg via INTRAVENOUS
  Filled 2021-10-07: qty 4

## 2021-10-07 NOTE — ED Provider Notes (Signed)
[place] DEPT Provider Note   CSN: [id number] Arrival date & time: 10/07/21  1310     History  No chief complaint on file.   Melissa Case is a 61 y.o. female.  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 She states that her legs have been swollen like this since she was found to have a DVT last year. States that her legs have been weeping significantly over the past few weeks and has been worsening. States that her legs 'pour fluid' despite regular dressing changes. She also states that she has a wound on her right lower leg and ankle that is new recently. She was seen but urgent care for same at the beginning of May and place on Keflex.  She states that she took the entire course of this with minimal improvement in her wounds.  She denies any fevers or chills.  Denies any history of diabetes.  States that she has never seen wound care or had dressing changes of her wounds in the past previously. Patient states that she has been compliant with her Elliquis regimen and denies any missed doses of this.  The history is provided by the patient. No language interpreter was used.      Home Medications Prior to Admission medications   Medication Sig Start Date End Date Taking? Authorizing Provider  Acetaminophen (TYLENOL ARTHRITIS PAIN PO) Take 2 tablets by mouth as needed.    [provider]  apixaban (ELIQUIS) 5 MG TABS tablet Take 1 tablet (5 mg total) by mouth 2 (two) times daily. 08/12/21   Maryjane Hurter, MD  calcium carbonate (TUMS - DOSED IN MG ELEMENTAL CALCIUM) 500 MG chewable tablet Chew 1 tablet by mouth as needed for indigestion or heartburn. Takes for breakthrough heartburn    [provider]  cephALEXin (KEFLEX) 500 MG capsule Take 1 capsule (500 mg total) by mouth 4 (four) times daily for 7 days. 10/02/21  10/09/21  Crain, Whitney L, PA  dexamethasone (DECADRON) 2 MG tablet Take 2 mg by mouth 3 (three) times daily.    [provider]  divalproex (DEPAKOTE) 250 MG DR tablet Take 1 tablet (250 mg total) by mouth 2 (two) times daily. (In addition to '500mg'$  twice daily for a total dosage of '750mg'$  BID). 08/08/21   Alric Ran, MD  divalproex (DEPAKOTE) 500 MG DR tablet Take 1 tablet (500 mg total) by mouth 2 (two) times daily. 08/08/21   Alric Ran, MD  docusate sodium (COLACE) 100 MG capsule Take 1 capsule (100 mg total) by mouth daily as needed for up to 30 doses for mild constipation. To prevent constipation while taking oxycodone 06/20/21   Wyvonnia Dusky, MD  doxycycline (VIBRAMYCIN) 100 MG capsule Take 1 capsule (100 mg total) by mouth 2 (two) times daily. Patient not taking: Reported on 10/02/2021 09/13/21   Quintella Reichert, MD  esomeprazole (NEXIUM) 40 MG capsule Take 1 capsule (40 mg total) by mouth 2 (two) times daily before a meal. 08/15/21   Thornton Park, MD  furosemide (LASIX) 40 MG tablet Take 1 tablet (40 mg total) by mouth daily. 07/27/21   Veryl Speak, MD  HYDROcodone-acetaminophen (NORCO/VICODIN) 5-325 MG tablet Take 1 tablet by mouth every 6 (six) hours as needed. 08/20/21   Hayden Rasmussen, MD  levETIRAcetam (KEPPRA) 500 MG tablet Take 1 tablet (500 mg total) by mouth 2 (two)  times daily. 08/08/21   Alric Ran, MD  ondansetron (ZOFRAN-ODT) 4 MG disintegrating tablet Take 4 mg by mouth every 8 (eight) hours as needed for nausea or vomiting.    [provider]  PARoxetine (PAXIL) 40 MG tablet Take 1 tablet (40 mg total) by mouth daily. 09/30/21   Arfeen, Arlyce Harman, MD  traZODone (DESYREL) 50 MG tablet Take 0.5 tablets (25 mg total) by mouth at bedtime. Patient not taking: Reported on 09/30/2021 07/15/21   Arfeen, Arlyce Harman, MD  umeclidinium-vilanterol [facility] ELLIPTA) 62.5-25 MCG/ACT AEPB Inhale 1 puff into the lungs daily. 08/12/21   Maryjane Hurter, MD       Allergies    Morphine and Penicillins    Review of Systems   Review of Systems  Constitutional:  Negative for chills and fever.  Respiratory:  Negative for shortness of breath.   Cardiovascular:  Positive for leg swelling. Negative for chest pain.  Skin:  Positive for wound.  All other systems reviewed and are negative.  Physical Exam Updated Vital Signs BP 137/71 (BP Location: Right Arm)   Pulse 73   Temp 98.2 F (36.8 C) (Oral)   Resp (!) 26   Ht '5\' 4"'$  (1.626 m)   Wt 124.7 kg   SpO2 98%   BMI 47.20 kg/m  Physical Exam Vitals and nursing note reviewed.  Constitutional:      General: She is not in acute distress.    Appearance: Normal appearance. She is normal weight. She is not ill-appearing, toxic-appearing or diaphoretic.  HENT:     Head: Normocephalic and atraumatic.  Cardiovascular:     Rate and Rhythm: Normal rate.  Pulmonary:     Effort: Pulmonary effort is normal. No respiratory distress.  Musculoskeletal:        General: Normal range of motion.     Cervical back: Normal range of motion.     Comments: 4+ pitting edema to bilateral lower extremities below knee.  Wound noted to lateral lower right leg without purulent drainage and some mild surrounding erythema. Ulcerated wound noted to the lateral malleolus as well. Capillary refill less than 2 seconds. Distal sensation intact. DP and PT pulses palpated with doppler. See images below for further information.  Skin:    General: Skin is warm and dry.  Neurological:     General: No focal deficit present.     Mental Status: She is alert.  Psychiatric:        Mood and Affect: Mood normal.        Behavior: Behavior normal.       ED Results / Procedures / Treatments   Labs (all labs ordered are listed, but only abnormal results are displayed) Labs Reviewed  COMPREHENSIVE METABOLIC PANEL - Abnormal; Notable for the following components:      Result Value   Calcium 8.6 (*)    Total Protein 6.0 (*)     Albumin 3.0 (*)    AST 48 (*)    ALT 92 (*)    All other components within normal limits  CBC WITH DIFFERENTIAL/PLATELET - Abnormal; Notable for the following components:   MCV 100.5 (*)    Abs Immature Granulocytes 0.31 (*)    All other components within normal limits  LACTIC ACID, PLASMA - Abnormal; Notable for the following components:   Lactic Acid, Venous 2.3 (*)    All other components within normal limits  CULTURE, BLOOD (ROUTINE X 2)  CULTURE, BLOOD (ROUTINE X 2)    EKG  None  Radiology DG Tibia/Fibula Right  Result Date: 10/07/2021 CLINICAL DATA:  Bilateral lower extremity swelling and right lower leg wound. EXAM: RIGHT ANKLE - COMPLETE 3+ VIEW; RIGHT TIBIA AND FIBULA - 2 VIEW COMPARISON:  None Available. FINDINGS: Tibia/fibula: There is no acute fracture or dislocation. Knee alignment is maintained. There is superior patellar enthesopathy. There is diffuse soft tissue edema throughout the calf. No soft tissue gas or radiopaque foreign body is seen. Ankle: There is no acute fracture or dislocation. There is no osseous erosion or destruction. Ankle alignment is maintained. Is moderate degenerative change about the ankle with narrowing of the ankle joint. Well corticated ossific fragments adjacent to the medial malleolus may reflect sequela of prior trauma. There is Achilles enthesopathy and inferior calcaneal spurring. There is diffuse soft tissue swelling about the ankle and foot. There is no soft tissue gas or radiopaque foreign body. IMPRESSION: Diffuse soft tissue swelling about the right calf extending into the ankle and foot with no acute osseous abnormality or osseous erosion/destruction. No soft tissue gas or radiopaque foreign body. Electronically Signed   By: Valetta Mole M.D.   On: 10/07/2021 15:13   DG Ankle Complete Right  Result Date: 10/07/2021 CLINICAL DATA:  Bilateral lower extremity swelling and right lower leg wound. EXAM: RIGHT ANKLE - COMPLETE 3+ VIEW; RIGHT TIBIA  AND FIBULA - 2 VIEW COMPARISON:  None Available. FINDINGS: Tibia/fibula: There is no acute fracture or dislocation. Knee alignment is maintained. There is superior patellar enthesopathy. There is diffuse soft tissue edema throughout the calf. No soft tissue gas or radiopaque foreign body is seen. Ankle: There is no acute fracture or dislocation. There is no osseous erosion or destruction. Ankle alignment is maintained. Is moderate degenerative change about the ankle with narrowing of the ankle joint. Well corticated ossific fragments adjacent to the medial malleolus may reflect sequela of prior trauma. There is Achilles enthesopathy and inferior calcaneal spurring. There is diffuse soft tissue swelling about the ankle and foot. There is no soft tissue gas or radiopaque foreign body. IMPRESSION: Diffuse soft tissue swelling about the right calf extending into the ankle and foot with no acute osseous abnormality or osseous erosion/destruction. No soft tissue gas or radiopaque foreign body. Electronically Signed   By: Valetta Mole M.D.   On: 10/07/2021 15:13   ECHOCARDIOGRAM COMPLETE  Result Date: 10/07/2021    ECHOCARDIOGRAM REPORT   Patient Name:   Melissa Case Date of Exam: 10/07/2021 Medical Rec #:  [id number]      Height:       64.0 in Accession #:    [id number]     Weight:       275.0 lb Date of Birth:  April 27, 1961      BSA:          2.240 m Patient Age:    13 years       BP:           128/80 mmHg Patient Gender: F              HR:           53 bpm. Exam Location:  [facility] Procedure: 2D Echo, Cardiac Doppler and Color Doppler Indications:    I26.09 Pulmonary Embolus  History:        Patient has prior history of Echocardiogram examinations, most                 recent 05/10/2021. DVT.  Sonographer:    NaTashia Rodgers-Jones  RDCS Referring Phys: [id number] Hortencia Conradi MEIER IMPRESSIONS  1. Left ventricular ejection fraction, by estimation, is 60 to 65%. The left ventricle has normal function. The left  ventricle has no regional wall motion abnormalities. There is mild left ventricular hypertrophy. Left ventricular diastolic parameters are consistent with Grade I diastolic dysfunction (impaired relaxation).  2. Right ventricular systolic function is normal. The right ventricular size is normal.  3. The mitral valve is normal in structure. Trivial mitral valve regurgitation. No evidence of mitral stenosis.  4. The aortic valve is tricuspid. Aortic valve regurgitation is not visualized. No aortic stenosis is present. FINDINGS  Left Ventricle: Left ventricular ejection fraction, by estimation, is 60 to 65%. The left ventricle has normal function. The left ventricle has no regional wall motion abnormalities. The left ventricular internal cavity size was normal in size. There is  mild left ventricular hypertrophy. Left ventricular diastolic parameters are consistent with Grade I diastolic dysfunction (impaired relaxation). Right Ventricle: The right ventricular size is normal. Right ventricular systolic function is normal. Left Atrium: Left atrial size was normal in size. Right Atrium: Right atrial size was normal in size. Pericardium: There is no evidence of pericardial effusion. Mitral Valve: The mitral valve is normal in structure. Mild mitral annular calcification. Trivial mitral valve regurgitation. No evidence of mitral valve stenosis. Tricuspid Valve: The tricuspid valve is normal in structure. Tricuspid valve regurgitation is not demonstrated. No evidence of tricuspid stenosis. Aortic Valve: The aortic valve is tricuspid. Aortic valve regurgitation is not visualized. No aortic stenosis is present. Pulmonic Valve: The pulmonic valve was not well visualized. Pulmonic valve regurgitation is not visualized. No evidence of pulmonic stenosis. Aorta: The aortic root is normal in size and structure. Venous: The inferior vena cava was not well visualized. IAS/Shunts: The interatrial septum was not well visualized.  LEFT  VENTRICLE PLAX 2D LVIDd:         3.60 cm   Diastology LVIDs:         2.40 cm   LV e' medial:    7.93 cm/s LV PW:         1.20 cm   LV E/e' medial:  11.8 LV IVS:        1.20 cm   LV e' lateral:   10.10 cm/s LVOT diam:     2.00 cm   LV E/e' lateral: 9.3 LV SV:         76 LV SV Index:   34 LVOT Area:     3.14 cm  RIGHT VENTRICLE             IVC RV Basal diam:  3.30 cm     IVC diam: 2.00 cm RV S prime:     12.75 cm/s TAPSE (M-mode): 2.6 cm LEFT ATRIUM             Index        RIGHT ATRIUM          Index LA diam:        4.00 cm 1.79 cm/m   RA Area:     9.52 cm LA Vol (A2C):   29.7 ml 13.26 ml/m  RA Volume:   19.30 ml 8.62 ml/m LA Vol (A4C):   26.1 ml 11.65 ml/m LA Biplane Vol: 28.5 ml 12.72 ml/m  AORTIC VALVE LVOT Vmax:   124.50 cm/s LVOT Vmean:  79.250 cm/s LVOT VTI:    0.240 m  AORTA Ao Root diam: 3.00 cm Ao Asc diam:  3.20 cm MITRAL  VALVE MV Area (PHT): 3.91 cm     SHUNTS MV Decel Time: 194 msec     Systemic VTI:  0.24 m MV E velocity: 93.50 cm/s   Systemic Diam: 2.00 cm MV A velocity: 124.00 cm/s MV E/A ratio:  0.75 Kirk Ruths MD Electronically signed by Kirk Ruths MD Signature Date/Time: 10/07/2021/1:26:44 PM    Final     Procedures Procedures    Medications Ordered in ED Medications - No data to display  ED Course/ Medical Decision Making/ A&P                           Medical Decision Making Amount and/or Complexity of Data Reviewed Labs: ordered. Radiology: ordered.   This patient presents to the ED for concern of bilateral lower leg swelling and right lower extremity wounds, this involves an extensive number of treatment options, and is a complaint that carries with it a high risk of complications and morbidity.   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  The pertinent results include: No leukocytosis or anemia. Lactic 2.3. No other acute laboratory findings   Imaging Studies ordered:  I ordered imaging studies including DG right tib/fib and ankle I independently visualized and interpreted imaging which showed  Diffuse soft tissue swelling about the right calf extending into the ankle and foot with no acute osseous abnormality or osseous erosion/destruction. No soft tissue gas or radiopaque foreign body. I agree with the radiologist interpretation   Problem List / ED Course / Critical interventions / Medication management  I ordered medication including Lasix for diuresis  Reevaluation of the patient after these medicines showed that the patient stayed the same I have reviewed the patients home medicines and have made adjustments as needed  Test / Admission - Considered:  Patient here for evaluation of right lower extremity weeping, has a history of DVT on anticoagulation, chronic lymphedema.  On examination she has significant amount of serous drainage from her bilateral lower extremities with area of erythema on the lateral malleolus and lateral right leg. No signs of purulent drainage noted, no leukocytosis on exam. No fevers or tachycardia. Suspect patients wounds are related to venous congestion and low suspicion for infectious etiology. Wounds have been evaluated with my attending Dr. Eulis Foster who is in agreement.  She is well-perfused distally. Current clinical picture is not consistent with decompensated CHF, infection, limb ischemia, or renal failure. However, given that patient has presented to the ER several times with concerns for worsening lymphedema and has never seen wound care or had pressure dressings in place, offered patient admission for same with diuresis. Patient is refusing admission at this time. Will apply dressings to lower extremities and increase Lasix from 40 mg daily  to BID and give prescription for oral potassium to help with diuresis at home. Will also give information to the lymphedema clinic with number to call to schedule an appointment for further evaluation and management of her lymphedema. Will also recommend close PCP follow-up in the next few days.  Patient is understanding and amenable with plan.  Educated on red flag symptoms of prompt immediate return.  Discharged in stable condition.   This is a shared visit with supervising physician Dr.  Eulis Foster who has independently evaluated patient & provided guidance in evaluation/management/disposition, in agreement with care     Final Clinical Impression(s) / ED Diagnoses Final diagnoses:  Lymphedema    Rx / DC Orders ED Discharge Orders          Ordered    potassium chloride SA (KLOR-CON M) 20 MEQ tablet  2 times daily        10/07/21 1909          An After Visit Summary was printed and given to the patient.     Nestor Lewandowsky 10/07/21 1914    Daleen Bo, MD 10/08/21 [phone]

## 2021-10-07 NOTE — Discharge Instructions (Addendum)
As we discussed, both of your legs are swollen and draining fluid.  Given this, I have strongly advised that you be admitted to the hospital for management of this.  However given your refusal to be admitted, I recommend that you increase your home Lasix to 40 mg twice a day.  I have given you a prescription for potassium to take as prescribed as increasing Lasix can cause your potassium to drop to a dangerous level.  Please take Lasix 40 mg twice a day as well as prescribed potassium.  I have given you a referral to the [place] [facility] center who manages lymphedema specifically.  Please call them and schedule an appointment in the next few days.  I also recommend that you call your primary care doctor and schedule an appointment for further evaluation and management of your symptoms in the next few days.  In the interim, please make sure that you are wearing compression stockings every day to help manage your lymphedema at home.  Return if development of any new or worsening symptoms.

## 2021-10-07 NOTE — ED Notes (Signed)
Patient's bilateral lower legs wrapped with pressure dressing per PA Smoot.

## 2021-10-07 NOTE — ED Triage Notes (Signed)
Pt c/o "a lot of fluid coming out" of right leg. States "this has been going on for quite a while" but now having severe pain to area.

## 2021-10-07 NOTE — ED Triage Notes (Signed)
Patient states she has been on an oral antibiotic x 5 days. Patient states she has 2 open wounds to the right lower leg with yellow drainage.  Patient also has an open wound, redness, and swelling to the left ankle. Patient was seen at an UC today and told to come to the Ed for IV antibiotics.

## 2021-10-07 NOTE — ED Notes (Signed)
I attempted twice to collect labs and was unsuccessful 

## 2021-10-07 NOTE — ED Notes (Signed)
Patient is being discharged from the Urgent Care and sent to the Emergency Department via personal vehicle . Per Provider [facility], patient is in need of higher level of care due to cellulitis of leg. Patient is aware and verbalizes understanding of plan of care.  Vitals:   10/07/21 1220  BP: 124/81  Pulse: 88  Resp: 18  Temp: (!) 97.5 F (36.4 C)  SpO2: 92%

## 2021-10-07 NOTE — ED Provider Triage Note (Signed)
Emergency Medicine Provider Triage Evaluation Note  Melissa Case , a 61 y.o. female  was evaluated in triage.  Pt complains of bilateral lower extremity swelling and right lower leg wound. Hx lymphedema, states that her bilateral lower legs having been 'leaking' more than normal. Also states that she has had some wounds on her lower extremities which she has been having difficulty getting to heal. Initially went to UC and was prescribed oral abx, states that the wounds have not improved. She states she returned to UC today and was sent here for evaluation. Patient denies hx of diabetes  Review of Systems  Positive: Bilateral lower leg pain, right lower leg wounds Negative: Fever, chills  Physical Exam  BP 105/65 (BP Location: Left Arm)   Pulse 87   Temp 98.2 F (36.8 C) (Oral)   Resp 15   Ht '5\' 4"'$  (1.626 m)   Wt 124.7 kg   SpO2 95%   BMI 47.20 kg/m  Gen:   Awake, no distress   Resp:  Normal effort  MSK:   Moves extremities without difficulty  Other:  4+ pitting edema to bilateral lower extremities below knee.  Wound noted to lateral lower right leg with purulent drainage and surrounding erythema. Ulcerated wound noted to the lateral malleolus as well. Capillary refill less than 2 seconds. Distal sensation intact  Medical Decision Making  Medically screening exam initiated at 2:31 PM.  Appropriate orders placed.  Melissa Case was informed that the remainder of the evaluation will be completed by another provider, this initial triage assessment does not replace that evaluation, and the importance of remaining in the ED until their evaluation is complete.     Bud Face, PA-C 10/07/21 1436

## 2021-10-07 NOTE — Discharge Instructions (Signed)
Please go to the emergency department as soon as you leave urgent care for further evaluation and management. ?

## 2021-10-07 NOTE — ED Provider Notes (Signed)
[facility]    CSN: [id number] Arrival date & time: 10/07/21  1205      History   Chief Complaint Chief Complaint  Patient presents with   right leg drainage    HPI Melissa Case is a 61 y.o. female.   Patient presents with right lower extremity redness, pain, drainage that has been present for "a while".  She was seen on 10/02/2021 at urgent care and prescribed cephalexin antibiotic for cellulitis of right lower extremity.  Patient and husband report that she has lymphedema so patient's legs "leak" occasionally but it has increased in swelling and drainage over the past few days.  Patient has also developed a new pain around her ankle since being seen in urgent care.  Denies fever, body aches, chills.  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  Denies any numbness or tingling to the area.  Patient has appointment with PCP in 2 to 3 weeks for further evaluation.    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   Lesion of parotid gland 07/14/2019   Acute calculous cholecystitis 11/21/2017   Gastro-esophageal reflux disease without esophagitis 02/03/2014   Abdominal pain  09/30/2013   SBO (small bowel obstruction) ([place]) 09/30/2013   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/05/2012   Gall stone 04/05/2012   Barrett's esophagus 04/05/2012   Cholelithiasis 03/27/2012   Hyperlipidemia 03/25/2012   Depression 09/15/2011   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    Past Surgical History:  Procedure Laterality Date   ABDOMINAL HYSTERECTOMY     CHOLECYSTECTOMY N/A 11/21/2017   Procedure: LAPAROSCOPIC CHOLECYSTECTOMY WITH INTRAOPERATIVE CHOLANGIOGRAM;  Surgeon: Jovita Kussmaul, MD;  Location: [facility];  Service: General;  Laterality: N/A;   MULTIPLE EXTRACTIONS WITH ALVEOLOPLASTY N/A 08/28/2019   Procedure: Extraction of tooth #'s 3-7, 10-15, and 20-31 with alveoloplatsy, maxillary right buccal exostosis reduction, and bilateral mandibular tori reductions.;  Surgeon: Lenn Cal, DDS;  Location: [place];  Service: Oral Surgery;  Laterality: N/A;   PAROTIDECTOMY Right 07/22/2019   with biopsy; done at [facility] by Dr. Fredricka Bonine    OB History   No obstetric history on file.      Home Medications    Prior to Admission medications   Medication Sig Start Date End Date Taking? Authorizing Provider  Acetaminophen (TYLENOL ARTHRITIS PAIN PO) Take 2 tablets by mouth as needed.    [provider]  apixaban (ELIQUIS) 5 MG TABS tablet Take 1 tablet (5 mg total) by mouth 2 (two) times daily. 08/12/21   Maryjane Hurter, MD  calcium carbonate (TUMS - DOSED  IN MG ELEMENTAL CALCIUM) 500 MG chewable tablet Chew 1 tablet by mouth as needed for indigestion or heartburn. Takes for breakthrough heartburn    [provider]  cephALEXin (KEFLEX) 500 MG capsule Take 1 capsule (500 mg total) by mouth 4 (four) times daily for 7 days. 10/02/21 10/09/21  Crain, Whitney L, PA  dexamethasone (DECADRON) 2 MG tablet Take 2 mg by mouth 3 (three) times daily.    [provider]  divalproex (DEPAKOTE) 250 MG DR tablet Take 1 tablet (250 mg total) by mouth 2 (two) times daily. (In  addition to '500mg'$  twice daily for a total dosage of '750mg'$  BID). 08/08/21   Alric Ran, MD  divalproex (DEPAKOTE) 500 MG DR tablet Take 1 tablet (500 mg total) by mouth 2 (two) times daily. 08/08/21   Alric Ran, MD  docusate sodium (COLACE) 100 MG capsule Take 1 capsule (100 mg total) by mouth daily as needed for up to 30 doses for mild constipation. To prevent constipation while taking oxycodone 06/20/21   Wyvonnia Dusky, MD  doxycycline (VIBRAMYCIN) 100 MG capsule Take 1 capsule (100 mg total) by mouth 2 (two) times daily. Patient not taking: Reported on 10/02/2021 09/13/21   Quintella Reichert, MD  esomeprazole (NEXIUM) 40 MG capsule Take 1 capsule (40 mg total) by mouth 2 (two) times daily before a meal. 08/15/21   Thornton Park, MD  furosemide (LASIX) 40 MG tablet Take 1 tablet (40 mg total) by mouth daily. 07/27/21   Veryl Speak, MD  HYDROcodone-acetaminophen (NORCO/VICODIN) 5-325 MG tablet Take 1 tablet by mouth every 6 (six) hours as needed. 08/20/21   Hayden Rasmussen, MD  levETIRAcetam (KEPPRA) 500 MG tablet Take 1 tablet (500 mg total) by mouth 2 (two) times daily. 08/08/21   Alric Ran, MD  ondansetron (ZOFRAN-ODT) 4 MG disintegrating tablet Take 4 mg by mouth every 8 (eight) hours as needed for nausea or vomiting.    [provider]  PARoxetine (PAXIL) 40 MG tablet Take 1 tablet (40 mg total) by mouth daily. 09/30/21   Arfeen, Arlyce Harman, MD  traZODone (DESYREL) 50 MG tablet Take 0.5 tablets (25 mg total) by mouth at bedtime. Patient not taking: Reported on 09/30/2021 07/15/21   Arfeen, Arlyce Harman, MD  umeclidinium-vilanterol [facility] ELLIPTA) 62.5-25 MCG/ACT AEPB Inhale 1 puff into the lungs daily. 08/12/21   Maryjane Hurter, MD    Family History Family History  Problem Relation Age of Onset   Depression Sister    Heart disease Mother    Diabetes Father    Breast cancer Paternal Grandmother    Colon cancer Neg Hx    Pancreatic cancer Neg Hx    Esophageal cancer Neg Hx     Stomach cancer Neg Hx    Rectal cancer Neg Hx    Liver cancer Neg Hx     Social History Social History   Tobacco Use   Smoking status: Former    Packs/day: 0.50    Years: 42.00    Pack years: 21.00    Types: Cigarettes    Start date: 56    Quit date: 01/16/2021    Years since quitting: 0.7    Passive exposure: Past   Smokeless tobacco: Never  Vaping Use   Vaping Use: Every day   Start date: 01/16/2021   Substances: Nicotine  Substance Use Topics   Alcohol use: No    Alcohol/week: 0.0 standard drinks   Drug use: No     Allergies   Morphine  and Penicillins   Review of Systems Review of Systems Per HPI  Physical Exam Triage Vital Signs ED Triage Vitals  Enc Vitals Group     BP 10/07/21 1220 124/81     Pulse Rate 10/07/21 1220 88     Resp 10/07/21 1220 18     Temp 10/07/21 1220 (!) 97.5 F (36.4 C)     Temp Source 10/07/21 1220 Oral     SpO2 10/07/21 1220 92 %     Weight --      Height --      Head Circumference --      Peak Flow --      Pain Score 10/07/21 1221 0     Pain Loc --      Pain Edu? --      Excl. in [place]? --    No data found.  Updated Vital Signs BP 124/81 (BP Location: Right Arm)   Pulse 88   Temp (!) 97.5 F (36.4 C) (Oral)   Resp 18   SpO2 92%   Visual Acuity Right Eye Distance:   Left Eye Distance:   Bilateral Distance:    Right Eye Near:   Left Eye Near:    Bilateral Near:     Physical Exam Constitutional:      General: She is not in acute distress.    Appearance: Normal appearance. She is not toxic-appearing or diaphoretic.  HENT:     Head: Normocephalic and atraumatic.  Eyes:     Extraocular Movements: Extraocular movements intact.     Conjunctiva/sclera: Conjunctivae normal.  Pulmonary:     Effort: Pulmonary effort is normal.  Skin:    Comments: Significant 3-4+ pitting edema to right lower extremity below knee.  Abrasion area to lateral leg.  Appears to have some purulent drainage from this area.  There is also an  ulcerated type lesion present to lateral ankle at lateral malleolus.  No drainage noted from this area.  Difficulty with range of motion.  Patient is in a wheelchair.  Unable to adequately assess neurovascular status given amount of swelling but capillary refill appears to be normal.  Neurological:     General: No focal deficit present.     Mental Status: She is alert and oriented to person, place, and time. Mental status is at baseline.  Psychiatric:        Mood and Affect: Mood normal.        Behavior: Behavior normal.        Thought Content: Thought content normal.        Judgment: Judgment normal.     UC Treatments / Results  Labs (all labs ordered are listed, but only abnormal results are displayed) Labs Reviewed - No data to display  EKG   Radiology No results found.  Procedures Procedures (including critical care time)  Medications Ordered in UC Medications - No data to display  Initial Impression / Assessment and Plan / UC Course  I have reviewed the triage vital signs and the nursing notes.  Pertinent labs & imaging results that were available during my care of the patient were reviewed by me and considered in my medical decision making (see chart for details).     Due to new onset pain, new ulcerated lesion, significant swelling, and cellulitis being refractory to oral antibiotics, I do think this warrants further evaluation and management at the hospital.  Patient was advised that she will need to go to the hospital for further evaluation  and management.  Husband and patient were agreeable with plan.  Vital signs stable at discharge.  Agree with patient's husband transporting to the hospital. Final Clinical Impressions(s) / UC Diagnoses   Final diagnoses:  Cellulitis of leg, right     Discharge Instructions      Please go to the emergency department as soon as you leave urgent care for further evaluation and management.    ED Prescriptions   None     PDMP not reviewed this encounter.   Teodora Medici, [place] 10/07/21 1251

## 2021-10-07 NOTE — ED Notes (Signed)
PA and RN aware critical lactic 2.3.

## 2021-10-12 ENCOUNTER — Encounter (HOSPITAL_COMMUNITY): Payer: Self-pay

## 2021-10-12 ENCOUNTER — Other Ambulatory Visit: Payer: Self-pay

## 2021-10-12 ENCOUNTER — Inpatient Hospital Stay (HOSPITAL_COMMUNITY)
Admission: EM | Admit: 2021-10-12 | Discharge: 2021-10-19 | DRG: 291 | Disposition: A | Discharge status: Home Health | Payer: BC Managed Care – PPO | Attending: Internal Medicine | Admitting: Internal Medicine

## 2021-10-12 ENCOUNTER — Emergency Department (HOSPITAL_COMMUNITY): Payer: BC Managed Care – PPO

## 2021-10-12 DIAGNOSIS — R531 Weakness: Secondary | ICD-10-CM

## 2021-10-12 DIAGNOSIS — Z923 Personal history of irradiation: Secondary | ICD-10-CM

## 2021-10-12 DIAGNOSIS — Z803 Family history of malignant neoplasm of breast: Secondary | ICD-10-CM

## 2021-10-12 DIAGNOSIS — I5033 Acute on chronic diastolic (congestive) heart failure: Secondary | ICD-10-CM | POA: Diagnosis not present

## 2021-10-12 DIAGNOSIS — J9601 Acute respiratory failure with hypoxia: Secondary | ICD-10-CM | POA: Diagnosis not present

## 2021-10-12 DIAGNOSIS — R110 Nausea: Secondary | ICD-10-CM | POA: Diagnosis not present

## 2021-10-12 DIAGNOSIS — K219 Gastro-esophageal reflux disease without esophagitis: Secondary | ICD-10-CM | POA: Diagnosis present

## 2021-10-12 DIAGNOSIS — Z818 Family history of other mental and behavioral disorders: Secondary | ICD-10-CM

## 2021-10-12 DIAGNOSIS — Z7901 Long term (current) use of anticoagulants: Secondary | ICD-10-CM | POA: Diagnosis not present

## 2021-10-12 DIAGNOSIS — F32A Depression, unspecified: Secondary | ICD-10-CM | POA: Diagnosis not present

## 2021-10-12 DIAGNOSIS — F410 Panic disorder [episodic paroxysmal anxiety] without agoraphobia: Secondary | ICD-10-CM | POA: Diagnosis present

## 2021-10-12 DIAGNOSIS — Z87891 Personal history of nicotine dependence: Secondary | ICD-10-CM | POA: Diagnosis not present

## 2021-10-12 DIAGNOSIS — Z6841 Body Mass Index (BMI) 40.0 and over, adult: Secondary | ICD-10-CM

## 2021-10-12 DIAGNOSIS — L03115 Cellulitis of right lower limb: Secondary | ICD-10-CM | POA: Diagnosis present

## 2021-10-12 DIAGNOSIS — Z79899 Other long term (current) drug therapy: Secondary | ICD-10-CM | POA: Diagnosis not present

## 2021-10-12 DIAGNOSIS — L97119 Non-pressure chronic ulcer of right thigh with unspecified severity: Secondary | ICD-10-CM | POA: Diagnosis present

## 2021-10-12 DIAGNOSIS — B952 Enterococcus as the cause of diseases classified elsewhere: Secondary | ICD-10-CM | POA: Diagnosis present

## 2021-10-12 DIAGNOSIS — Z833 Family history of diabetes mellitus: Secondary | ICD-10-CM | POA: Diagnosis not present

## 2021-10-12 DIAGNOSIS — C07 Malignant neoplasm of parotid gland: Secondary | ICD-10-CM | POA: Diagnosis not present

## 2021-10-12 DIAGNOSIS — Z86718 Personal history of other venous thrombosis and embolism: Secondary | ICD-10-CM

## 2021-10-12 DIAGNOSIS — R601 Generalized edema: Secondary | ICD-10-CM | POA: Diagnosis not present

## 2021-10-12 DIAGNOSIS — I872 Venous insufficiency (chronic) (peripheral): Secondary | ICD-10-CM | POA: Diagnosis present

## 2021-10-12 DIAGNOSIS — C7931 Secondary malignant neoplasm of brain: Secondary | ICD-10-CM | POA: Diagnosis not present

## 2021-10-12 DIAGNOSIS — Z85818 Personal history of malignant neoplasm of other sites of lip, oral cavity, and pharynx: Secondary | ICD-10-CM

## 2021-10-12 DIAGNOSIS — G40909 Epilepsy, unspecified, not intractable, without status epilepticus: Secondary | ICD-10-CM | POA: Diagnosis not present

## 2021-10-12 DIAGNOSIS — N3000 Acute cystitis without hematuria: Secondary | ICD-10-CM | POA: Diagnosis not present

## 2021-10-12 DIAGNOSIS — Z86711 Personal history of pulmonary embolism: Secondary | ICD-10-CM | POA: Diagnosis not present

## 2021-10-12 DIAGNOSIS — Z90710 Acquired absence of both cervix and uterus: Secondary | ICD-10-CM | POA: Diagnosis not present

## 2021-10-12 DIAGNOSIS — J9811 Atelectasis: Secondary | ICD-10-CM | POA: Diagnosis not present

## 2021-10-12 DIAGNOSIS — E876 Hypokalemia: Secondary | ICD-10-CM | POA: Diagnosis not present

## 2021-10-12 DIAGNOSIS — L8990 Pressure ulcer of unspecified site, unspecified stage: Secondary | ICD-10-CM | POA: Insufficient documentation

## 2021-10-12 DIAGNOSIS — I890 Lymphedema, not elsewhere classified: Secondary | ICD-10-CM | POA: Diagnosis present

## 2021-10-12 DIAGNOSIS — G9389 Other specified disorders of brain: Secondary | ICD-10-CM

## 2021-10-12 DIAGNOSIS — Z8249 Family history of ischemic heart disease and other diseases of the circulatory system: Secondary | ICD-10-CM

## 2021-10-12 DIAGNOSIS — R000 Tachycardia, unspecified: Secondary | ICD-10-CM | POA: Diagnosis not present

## 2021-10-12 LAB — URINALYSIS, ROUTINE W REFLEX MICROSCOPIC
Bilirubin Urine: NEGATIVE
Glucose, UA: NEGATIVE mg/dL
Ketones, ur: 5 mg/dL — AB
Leukocytes,Ua: NEGATIVE
Nitrite: POSITIVE — AB
Protein, ur: NEGATIVE mg/dL
Specific Gravity, Urine: 1.009 (ref 1.005–1.030)
pH: 5 (ref 5.0–8.0)

## 2021-10-12 LAB — CBC
HCT: 41.9 % (ref 36.0–46.0)
Hemoglobin: 13.1 g/dL (ref 12.0–15.0)
MCH: 31.4 pg (ref 26.0–34.0)
MCHC: 31.3 g/dL (ref 30.0–36.0)
MCV: 100.5 fL — ABNORMAL HIGH (ref 80.0–100.0)
Platelets: 279 10*3/uL (ref 150–400)
RBC: 4.17 MIL/uL (ref 3.87–5.11)
RDW: 15.2 % (ref 11.5–15.5)
WBC: 5.2 10*3/uL (ref 4.0–10.5)
nRBC: 0 % (ref 0.0–0.2)

## 2021-10-12 LAB — CULTURE, BLOOD (ROUTINE X 2)
Culture: NO GROWTH
Special Requests: ADEQUATE

## 2021-10-12 LAB — BASIC METABOLIC PANEL
Anion gap: 14 (ref 5–15)
BUN: <5 mg/dL — ABNORMAL LOW (ref 6–20)
CO2: 27 mmol/L (ref 22–32)
Calcium: 8.2 mg/dL — ABNORMAL LOW (ref 8.9–10.3)
Chloride: 96 mmol/L — ABNORMAL LOW (ref 98–111)
Creatinine, Ser: 0.52 mg/dL (ref 0.44–1.00)
GFR, Estimated: >60 mL/min (ref >60)
Glucose, Bld: 115 mg/dL — ABNORMAL HIGH (ref 70–99)
Potassium: 3 mmol/L — ABNORMAL LOW (ref 3.5–5.1)
Sodium: 137 mmol/L (ref 135–145)

## 2021-10-12 LAB — HEPATIC FUNCTION PANEL
ALT: 63 U/L — ABNORMAL HIGH (ref 0–44)
AST: 36 U/L (ref 15–41)
Albumin: 2.7 g/dL — ABNORMAL LOW (ref 3.5–5.0)
Alkaline Phosphatase: 76 U/L (ref 38–126)
Bilirubin, Direct: 0.1 mg/dL (ref 0.0–0.2)
Indirect Bilirubin: 0.4 mg/dL (ref 0.3–0.9)
Total Bilirubin: 0.5 mg/dL (ref 0.3–1.2)
Total Protein: 5.9 g/dL — ABNORMAL LOW (ref 6.5–8.1)

## 2021-10-12 LAB — CBG MONITORING, ED: Glucose-Capillary: 145 mg/dL — ABNORMAL HIGH (ref 70–99)

## 2021-10-12 LAB — LIPASE, BLOOD: Lipase: 25 U/L (ref 11–51)

## 2021-10-12 LAB — MRSA NEXT GEN BY PCR, NASAL: MRSA by PCR Next Gen: NOT DETECTED

## 2021-10-12 MED ORDER — ONDANSETRON HCL 4 MG PO TABS
4.0000 mg | ORAL_TABLET | Freq: Four times a day (QID) | ORAL | Status: DC | PRN
  Administered 2021-10-13 – 2021-10-14 (×3): 4 mg via ORAL
  Filled 2021-10-12 (×4): qty 1

## 2021-10-12 MED ORDER — LEVETIRACETAM 500 MG PO TABS
500.0000 mg | ORAL_TABLET | Freq: Two times a day (BID) | ORAL | Status: DC
  Administered 2021-10-12 – 2021-10-19 (×14): 500 mg via ORAL
  Filled 2021-10-12 (×14): qty 1

## 2021-10-12 MED ORDER — OXYCODONE-ACETAMINOPHEN 5-325 MG PO TABS: 1.0000 | ORAL_TABLET | Freq: Once | ORAL | Status: DC

## 2021-10-12 MED ORDER — ONDANSETRON HCL 4 MG/2ML IJ SOLN
4.0000 mg | Freq: Four times a day (QID) | INTRAMUSCULAR | Status: DC | PRN
  Administered 2021-10-16 – 2021-10-17 (×2): 4 mg via INTRAVENOUS
  Filled 2021-10-12 (×2): qty 2

## 2021-10-12 MED ORDER — POTASSIUM CHLORIDE CRYS ER 20 MEQ PO TBCR
40.0000 meq | EXTENDED_RELEASE_TABLET | Freq: Once | ORAL | Status: AC
Start: 2021-10-12 — End: 2021-10-12
  Administered 2021-10-12: 40 meq via ORAL
  Filled 2021-10-12: qty 2

## 2021-10-12 MED ORDER — APIXABAN 5 MG PO TABS
5.0000 mg | ORAL_TABLET | Freq: Two times a day (BID) | ORAL | Status: DC
  Administered 2021-10-12 – 2021-10-19 (×14): 5 mg via ORAL
  Filled 2021-10-12 (×14): qty 1

## 2021-10-12 MED ORDER — SODIUM CHLORIDE 0.9 % IV SOLN
2.0000 g | INTRAVENOUS | Status: AC
  Administered 2021-10-13 – 2021-10-14 (×2): 2 g via INTRAVENOUS
  Filled 2021-10-12 (×2): qty 20

## 2021-10-12 MED ORDER — SODIUM CHLORIDE 0.9 % IV BOLUS
1000.0000 mL | Freq: Once | INTRAVENOUS | Status: AC
  Administered 2021-10-12: 1000 mL via INTRAVENOUS

## 2021-10-12 MED ORDER — POTASSIUM CHLORIDE CRYS ER 20 MEQ PO TBCR
20.0000 meq | EXTENDED_RELEASE_TABLET | Freq: Two times a day (BID) | ORAL | Status: DC
  Administered 2021-10-12 – 2021-10-13 (×3): 20 meq via ORAL
  Filled 2021-10-12 (×3): qty 1

## 2021-10-12 MED ORDER — PANTOPRAZOLE SODIUM 40 MG PO TBEC
80.0000 mg | DELAYED_RELEASE_TABLET | Freq: Every day | ORAL | Status: DC
  Administered 2021-10-13 – 2021-10-18 (×6): 80 mg via ORAL
  Filled 2021-10-12 (×7): qty 2

## 2021-10-12 MED ORDER — PAROXETINE HCL 20 MG PO TABS
40.0000 mg | ORAL_TABLET | Freq: Every day | ORAL | Status: DC
  Administered 2021-10-13 – 2021-10-19 (×7): 40 mg via ORAL
  Filled 2021-10-12 (×7): qty 2

## 2021-10-12 MED ORDER — FUROSEMIDE 40 MG PO TABS: 40.0000 mg | ORAL_TABLET | Freq: Two times a day (BID) | ORAL | Status: DC

## 2021-10-12 MED ORDER — DIVALPROEX SODIUM 250 MG PO DR TAB
500.0000 mg | DELAYED_RELEASE_TABLET | Freq: Two times a day (BID) | ORAL | Status: DC
  Administered 2021-10-12 – 2021-10-19 (×14): 500 mg via ORAL
  Filled 2021-10-12 (×15): qty 2

## 2021-10-12 MED ORDER — ACETAMINOPHEN 325 MG PO TABS
650.0000 mg | ORAL_TABLET | Freq: Four times a day (QID) | ORAL | Status: DC | PRN
  Administered 2021-10-15 – 2021-10-16 (×2): 650 mg via ORAL
  Filled 2021-10-12 (×2): qty 2

## 2021-10-12 MED ORDER — ACETAMINOPHEN 650 MG RE SUPP: 650.0000 mg | Freq: Four times a day (QID) | RECTAL | Status: DC | PRN

## 2021-10-12 MED ORDER — TRAZODONE HCL 50 MG PO TABS
50.0000 mg | ORAL_TABLET | Freq: Every evening | ORAL | Status: DC | PRN
  Filled 2021-10-12: qty 1

## 2021-10-12 MED ORDER — SODIUM CHLORIDE 0.9 % IV SOLN
2.0000 g | Freq: Once | INTRAVENOUS | Status: AC
  Administered 2021-10-12: 2 g via INTRAVENOUS
  Filled 2021-10-12: qty 20

## 2021-10-12 MED ORDER — LIP MEDEX EX OINT
TOPICAL_OINTMENT | CUTANEOUS | Status: DC | PRN
  Filled 2021-10-12: qty 7

## 2021-10-12 MED ORDER — DIVALPROEX SODIUM 250 MG PO DR TAB
250.0000 mg | DELAYED_RELEASE_TABLET | Freq: Two times a day (BID) | ORAL | Status: DC
  Administered 2021-10-12 – 2021-10-19 (×14): 250 mg via ORAL
  Filled 2021-10-12 (×14): qty 1

## 2021-10-12 NOTE — H&P (Signed)
History and Physical    Patient: Melissa Case YHC:[id number] DOB: 1961/03/15 DOA: 10/12/2021 DOS: the patient was seen and examined on 10/12/2021 PCP: Camillia Herter, NP  Patient coming from: Home  Chief Complaint:  Chief Complaint  Patient presents with   Weakness   Nausea   HPI: Melissa Case is a 61 y.o. female with medical history significant of parotid gland CA s/p surgery, radiation.  Brain mass s/p radiation.  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  BLE DVT and PEs in Dec with pulmonary necrosis, on eliquis.  Pt with chronic BLE DVTs and venous insufficiency since Dec 2022.  Worsening BLE edema since that time.  Ultimately developed venous stasis ulcers which have become infected with cellulitis recently.  Seen in ED x5 days ago, started on Keflex and Lasix.  Lasix has improved swelling, but purulent drainage and erythema continue despite keflex.   Review of Systems: As mentioned in the history of present illness. All other systems reviewed and are negative. 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   Panic attack    Seizures ([place])    Past Surgical History:  Procedure Laterality Date   ABDOMINAL HYSTERECTOMY     CHOLECYSTECTOMY N/A 11/21/2017   Procedure: LAPAROSCOPIC CHOLECYSTECTOMY WITH INTRAOPERATIVE CHOLANGIOGRAM;  Surgeon: Jovita Kussmaul, MD;  Location: [facility];  Service: General;  Laterality: N/A;   MULTIPLE EXTRACTIONS WITH ALVEOLOPLASTY N/A 08/28/2019   Procedure: Extraction of tooth #'s 3-7, 10-15, and 20-31 with alveoloplatsy, maxillary right buccal exostosis reduction, and bilateral mandibular tori reductions.;  Surgeon: Lenn Cal, DDS;  Location: [place];  Service: Oral Surgery;  Laterality: N/A;    PAROTIDECTOMY Right 07/22/2019   with biopsy; done at [facility] by Dr. Fredricka Bonine   Social History:  reports that she quit smoking about 8 months ago. Her smoking use included cigarettes. She started smoking about 45 years ago. She has a 21.00 pack-year smoking history. She has been exposed to tobacco smoke. She has never used smokeless tobacco. She reports that she does not drink alcohol and does not use drugs.  Allergies  Allergen Reactions   Morphine Itching   Penicillins Rash    Has patient had a PCN reaction causing immediate rash, facial/tongue/throat swelling, SOB or lightheadedness with hypotension: No Has patient had a PCN reaction causing severe rash involving mucus membranes or skin necrosis: No Has patient had a PCN reaction that required hospitalization No Has patient had a PCN reaction occurring within the last 10 years: No If all of the above answers are "NO", then may proceed with Cephalosporin use.     Family History  Problem Relation Age of Onset   Depression Sister    Heart disease Mother    Diabetes Father    Breast cancer Paternal Grandmother    Colon cancer Neg Hx    Pancreatic cancer Neg Hx    Esophageal cancer Neg Hx    Stomach cancer Neg Hx    Rectal cancer Neg Hx    Liver cancer Neg Hx     Prior to Admission medications   Medication Sig Start Date End Date Taking? Authorizing Provider  apixaban (ELIQUIS) 5 MG TABS tablet Take 1 tablet (5  mg total) by mouth 2 (two) times daily. 08/12/21  Yes Maryjane Hurter, MD  divalproex (DEPAKOTE) 250 MG DR tablet Take 1 tablet (250 mg total) by mouth 2 (two) times daily. (In addition to '500mg'$  twice daily for a total dosage of '750mg'$  BID). 08/08/21  Yes Alric Ran, MD  divalproex (DEPAKOTE) 500 MG DR tablet Take 1 tablet (500 mg total) by mouth 2 (two) times daily. 08/08/21  Yes Alric Ran, MD  esomeprazole (NEXIUM) 40 MG capsule Take 1 capsule (40 mg total) by mouth 2 (two) times daily before a meal.  08/15/21  Yes Thornton Park, MD  furosemide (LASIX) 40 MG tablet Take 1 tablet (40 mg total) by mouth 2 (two) times daily. 10/07/21 11/06/21 Yes Smoot, Leary Roca, PA-C  levETIRAcetam (KEPPRA) 500 MG tablet Take 1 tablet (500 mg total) by mouth 2 (two) times daily. 08/08/21  Yes Camara, Maryan Puls, MD  PARoxetine (PAXIL) 40 MG tablet Take 1 tablet (40 mg total) by mouth daily. 09/30/21  Yes Arfeen, Arlyce Harman, MD  potassium chloride SA (KLOR-CON M) 20 MEQ tablet Take 1 tablet (20 mEq total) by mouth 2 (two) times daily. 10/07/21 11/06/21 Yes Smoot, Leary Roca, PA-C  traZODone (DESYREL) 50 MG tablet Take 50 mg by mouth at bedtime as needed for sleep.   Yes [provider]    Physical Exam: Vitals:   10/12/21 1645 10/12/21 1700 10/12/21 1715 10/12/21 1800  BP: 130/67 (!) 105/57 109/62 107/60  Pulse: 95 93 99 93  Resp:   14 14  Temp:      TempSrc:      SpO2: 93% 93% 90% 91%   Constitutional: NAD, calm, comfortable Eyes: PERRL, lids and conjunctivae normal ENMT: Mucous membranes are moist. Posterior pharynx clear of any exudate or lesions.Normal dentition.  Neck: normal, supple, no masses, no thyromegaly Respiratory: clear to auscultation bilaterally, no wheezing, no crackles. Normal respiratory effort. No accessory muscle use.  Cardiovascular: Regular rate and rhythm, no murmurs / rubs / gallops. 3-4+ BLE edema. 2+ pedal pulses. No carotid bruits.  Abdomen: no tenderness, no masses palpated. No hepatosplenomegaly. Bowel sounds positive.  Musculoskeletal: no clubbing / cyanosis. No joint deformity upper and lower extremities. Good ROM, no contractures. Normal muscle tone.  Skin: Ulcers with erythema, purulent drainage of RLE Neurologic: R facial paralysis (chronic) Psychiatric: Normal judgment and insight. Alert and oriented x 3. Normal mood.   Data Reviewed:  There are no new results to review at this time.  Assessment and Plan: * Generalized weakness Appears to be due to worsening anasarca  with RLE cellulitis. ? Of UTI. DDx includes her prior brain mass from metastatic parotid CA, though this had been improving on imaging late last year and no new findings as of CT head in March.  Cellulitis of right leg Failed 5 days of outpt Keflex at this point with persistent cellulitis, purulent drainage from venous stasis ulcers in R leg. Rocephin Check MRSA PCR nares Check wound culture.  Anasarca Appears to be venous insufficiency as cause given: the onset of symptoms following BLE DVTs in Dec 2022 US venous reflux study in March was positive for reflux, chronic DVTs 2d Echo from 5 days ago = normal LVEF, grade 1 DD, normal RV systolic fxn No protein in urine to suggest nephrotic syndrome Mild hypoalbuminemia, ? Hepatogenic? Fatty liver on CT in March but no cirrhosis  On Lasix '40mg'$  PO BID started 5 days ago at home. Has improved swelling somewhat Has also made her hypokalemic  Will leave on lasix '40mg'$  BID for the moment. Will need to wear compression stockings. Pt admits she hasnt been wearing these. Will need outpt follow up with vascular surgery, but will be limited what we can do for her in hospital from this perspective. Presumably ultimately may need that incompetent perforator vein treated surgically?  Mass of temporal lobe S/p prior radiation treatment.  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  No new findings as of CT head in March. No worsening seizure symptoms / neuro symptoms per pt. 1) cont home seizure meds: depakote, keppra 2) consider repeat imaging of CNS at some point in future?  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      Advance Care Planning:   Code Status: Full Code  Consults: None  Family Communication: No family in room  Severity of Illness: The appropriate patient status for this patient is OBSERVATION. Observation status is  judged to be reasonable and necessary in order to provide the required intensity of service to ensure the patient's safety. The patient's presenting symptoms, physical exam findings, and initial radiographic and laboratory data in the context of their medical condition is felt to place them at decreased risk for further clinical deterioration. Furthermore, it is anticipated that the patient will be medically stable for discharge from the hospital within 2 midnights of admission.   Author: Etta Quill., DO 10/12/2021 8:48 PM  For on call review [email or web address].

## 2021-10-12 NOTE — ED Notes (Signed)
Attempted IV x 1 without success.  

## 2021-10-12 NOTE — ED Triage Notes (Signed)
Pt reports weakness, nausea, and loss of appetite x1 week. Pt denies abdominal pain, V/D, chest pain, and SHOB.

## 2021-10-12 NOTE — Assessment & Plan Note (Addendum)
Appears to be due to worsening anasarca with RLE cellulitis. ? Of UTI. DDx includes her prior brain mass from metastatic parotid CA, though this had been improving on imaging late last year and no new findings as of CT head in March.

## 2021-10-12 NOTE — Assessment & Plan Note (Signed)
Cont eliquis 

## 2021-10-12 NOTE — Assessment & Plan Note (Addendum)
1. Appears to be venous insufficiency as cause given: 1. the onset of symptoms following BLE DVTs in Dec 2022 2. US venous reflux study in March was positive for reflux, chronic DVTs 2. 2d Echo from 5 days ago = normal LVEF, grade 1 DD, normal RV systolic fxn 3. No protein in urine to suggest nephrotic syndrome 4. Mild hypoalbuminemia, ? Hepatogenic? 1. Fatty liver on CT in March but no cirrhosis  1. On Lasix '40mg'$  PO BID started 5 days ago at home. 1. Has improved swelling somewhat 2. Has also made her hypokalemic 3. Will leave on lasix '40mg'$  BID for the moment. 2. Will need to wear compression stockings. 1. Pt admits she hasnt been wearing these. 3. Will need outpt follow up with vascular surgery, but will be limited what we can do for her in hospital from this perspective. 1. Presumably ultimately may need that incompetent perforator vein treated surgically?

## 2021-10-12 NOTE — ED Provider Notes (Signed)
[facility] DEPT Provider Note   CSN: [id number] Arrival date & time: 10/12/21  1417     History  Chief Complaint  Patient presents with   Weakness   Nausea    Melissa Case is a 61 y.o. female.  Patient with a history of severe lymphedema and parotid cancer.  She presents today with weakness and worsening swelling in her legs.  The history is provided by the patient and medical records. No language interpreter was used.  Weakness Severity:  Moderate Onset quality:  Sudden Timing:  Constant Progression:  Worsening Chronicity:  Recurrent Context: not allergies   Relieved by:  Nothing Worsened by:  Nothing Ineffective treatments:  None tried Associated symptoms: no abdominal pain, no chest pain, no cough, no diarrhea, no frequency, no headaches and no seizures       Home Medications Prior to Admission medications   Medication Sig Start Date End Date Taking? Authorizing Provider  Acetaminophen (TYLENOL ARTHRITIS PAIN PO) Take 2 tablets by mouth as needed.    [provider]  apixaban (ELIQUIS) 5 MG TABS tablet Take 1 tablet (5 mg total) by mouth 2 (two) times daily. 08/12/21   Maryjane Hurter, MD  calcium carbonate (TUMS - DOSED IN MG ELEMENTAL CALCIUM) 500 MG chewable tablet Chew 1 tablet by mouth as needed for indigestion or heartburn. Takes for breakthrough heartburn    [provider]  dexamethasone (DECADRON) 2 MG tablet Take 2 mg by mouth 3 (three) times daily.    [provider]  divalproex (DEPAKOTE) 250 MG DR tablet Take 1 tablet (250 mg total) by mouth 2 (two) times daily. (In addition to '500mg'$  twice daily for a total dosage of '750mg'$  BID). 08/08/21   Alric Ran, MD  divalproex (DEPAKOTE) 500 MG DR tablet Take 1 tablet (500 mg total) by mouth 2 (two) times daily. 08/08/21   Alric Ran, MD  docusate sodium (COLACE) 100 MG capsule Take 1 capsule (100 mg total) by mouth daily as needed for up to 30 doses  for mild constipation. To prevent constipation while taking oxycodone 06/20/21   Wyvonnia Dusky, MD  doxycycline (VIBRAMYCIN) 100 MG capsule Take 1 capsule (100 mg total) by mouth 2 (two) times daily. Patient not taking: Reported on 10/02/2021 09/13/21   Quintella Reichert, MD  esomeprazole (NEXIUM) 40 MG capsule Take 1 capsule (40 mg total) by mouth 2 (two) times daily before a meal. 08/15/21   Thornton Park, MD  furosemide (LASIX) 40 MG tablet Take 1 tablet (40 mg total) by mouth 2 (two) times daily. 10/07/21 11/06/21  Smoot, Leary Roca, PA-C  HYDROcodone-acetaminophen (NORCO/VICODIN) 5-325 MG tablet Take 1 tablet by mouth every 6 (six) hours as needed. 08/20/21   Hayden Rasmussen, MD  levETIRAcetam (KEPPRA) 500 MG tablet Take 1 tablet (500 mg total) by mouth 2 (two) times daily. 08/08/21   Alric Ran, MD  ondansetron (ZOFRAN-ODT) 4 MG disintegrating tablet Take 4 mg by mouth every 8 (eight) hours as needed for nausea or vomiting.    [provider]  PARoxetine (PAXIL) 40 MG tablet Take 1 tablet (40 mg total) by mouth daily. 09/30/21   Arfeen, Arlyce Harman, MD  potassium chloride SA (KLOR-CON M) 20 MEQ tablet Take 1 tablet (20 mEq total) by mouth 2 (two) times daily. 10/07/21 11/06/21  Smoot, Leary Roca, PA-C  traZODone (DESYREL) 50 MG tablet Take 0.5 tablets (25 mg total) by mouth at bedtime. Patient not taking: Reported on 09/30/2021 07/15/21  Arfeen, Arlyce Harman, MD  umeclidinium-vilanterol (ANORO ELLIPTA) 62.5-25 MCG/ACT AEPB Inhale 1 puff into the lungs daily. 08/12/21   Maryjane Hurter, MD      Allergies    Morphine and Penicillins    Review of Systems   Review of Systems  Constitutional:  Negative for appetite change and fatigue.  HENT:  Negative for congestion, ear discharge and sinus pressure.   Eyes:  Negative for discharge.  Respiratory:  Negative for cough.   Cardiovascular:  Negative for chest pain.  Gastrointestinal:  Negative for abdominal pain and diarrhea.  Genitourinary:  Negative  for frequency and hematuria.  Musculoskeletal:  Negative for back pain.       Swelling in her leg  Skin:  Negative for rash.  Neurological:  Positive for weakness. Negative for seizures and headaches.  Psychiatric/Behavioral:  Negative for hallucinations.    Physical Exam Updated Vital Signs BP 107/60   Pulse 93   Temp 97.9 F (36.6 C) (Oral)   Resp 14   SpO2 91%  Physical Exam Vitals and nursing note reviewed.  Constitutional:      Appearance: She is well-developed.  HENT:     Head: Normocephalic.     Nose: Nose normal.  Eyes:     General: No scleral icterus.    Conjunctiva/sclera: Conjunctivae normal.  Neck:     Thyroid: No thyromegaly.  Cardiovascular:     Rate and Rhythm: Normal rate and regular rhythm.     Heart sounds: No murmur heard.   No friction rub. No gallop.  Pulmonary:     Breath sounds: No stridor. No wheezing or rales.  Chest:     Chest wall: No tenderness.  Abdominal:     General: There is no distension.     Tenderness: There is no abdominal tenderness. There is no rebound.  Musculoskeletal:        General: Normal range of motion.     Cervical back: Neck supple.     Comments: Patient has significant edema although both legs to her umbilicus she also has redness to right lower leg that looks like a cellulitis  Lymphadenopathy:     Cervical: No cervical adenopathy.  Skin:    Findings: No erythema or rash.  Neurological:     Mental Status: She is alert and oriented to person, place, and time.     Motor: No abnormal muscle tone.     Coordination: Coordination normal.     Comments: Patient has right facial droop from previous surgery for parotid cancer  Psychiatric:        Behavior: Behavior normal.    ED Results / Procedures / Treatments   Labs (all labs ordered are listed, but only abnormal results are displayed) Labs Reviewed  BASIC METABOLIC PANEL - Abnormal; Notable for the following components:      Result Value   Potassium 3.0 (*)     Chloride 96 (*)    Glucose, Bld 115 (*)    BUN <5 (*)    Calcium 8.2 (*)    All other components within normal limits  CBC - Abnormal; Notable for the following components:   MCV 100.5 (*)    All other components within normal limits  URINALYSIS, ROUTINE W REFLEX MICROSCOPIC - Abnormal; Notable for the following components:   Hgb urine dipstick SMALL (*)    Ketones, ur 5 (*)    Nitrite POSITIVE (*)    Bacteria, UA RARE (*)    All other components within  normal limits  HEPATIC FUNCTION PANEL - Abnormal; Notable for the following components:   Total Protein 5.9 (*)    Albumin 2.7 (*)    ALT 63 (*)    All other components within normal limits  CBG MONITORING, ED - Abnormal; Notable for the following components:   Glucose-Capillary 145 (*)    All other components within normal limits  URINE CULTURE  LIPASE, BLOOD    EKG EKG Interpretation  Date/Time:  Wednesday Oct 12 2021 14:32:00 EDT Ventricular Rate:  102 PR Interval:  151 QRS Duration: 91 QT Interval:  346 QTC Calculation: 451 R Axis:   -36 Text Interpretation: Sinus tachycardia Left axis deviation Abnormal R-wave progression, late transition Minimal ST depression, lateral leads Confirmed by Milton Ferguson [phone]) on 10/12/2021 5:42:15 PM  Radiology DG Chest Port 1 View  Result Date: 10/12/2021 CLINICAL DATA:  Weakness EXAM: PORTABLE CHEST 1 VIEW COMPARISON:  Chest x-ray dated September 12, 2021; CT chest dated July 25, 2021 FINDINGS: Unchanged cardiac and mediastinal contours. Mild opacity of the right upper lung and bibasilar atelectasis, findings are similar to recent prior CT. No new focal opacity. No large pleural effusion or pneumothorax. IMPRESSION: Mild opacity of the right upper lung and bibasilar atelectasis, findings are similar to recent prior CT. No new focal opacity. Electronically Signed   By: Yetta Glassman M.D.   On: 10/12/2021 18:35    Procedures Procedures    Medications Ordered in ED Medications   sodium chloride 0.9 % bolus 1,000 mL (1,000 mLs Intravenous New Bag/Given 10/12/21 1813)  potassium chloride SA (KLOR-CON M) CR tablet 40 mEq (40 mEq Oral Given 10/12/21 1814)  cefTRIAXone (ROCEPHIN) 2 g in sodium chloride 0.9 % 100 mL IVPB (2 g Intravenous New Bag/Given 10/12/21 1814)    ED Course/ Medical Decision Making/ A&P                           Medical Decision Making Amount and/or Complexity of Data Reviewed Labs: ordered. Radiology: ordered.  Risk Prescription drug management. Decision regarding hospitalization.  This patient presents to the ED for concern of swelling in her legs and weakness, this involves an extensive number of treatment options, and is a complaint that carries with it a high risk of complications and morbidity.  The differential diagnosis includes lymphedema, congestive heart failure,   Co morbidities that complicate the patient evaluation  History of parotid cancer and lymphedema   Additional history obtained:  Additional history obtained from significant other External records from outside source obtained and reviewed including hospital record   Lab Tests:  I Ordered, and personally interpreted labs.  The pertinent results include: CBC which was unremarkable chemistries and liver study shows low albumin at 2.7 and low potassium at 3.0, urinalysis suggest UTI   Imaging Studies ordered:  I ordered imaging studies including chest x-ray I independently visualized and interpreted imaging which showed opacity in right lower lobe I agree with the radiologist interpretation   Cardiac Monitoring: / EKG:  The patient was maintained on a cardiac monitor.  I personally viewed and interpreted the cardiac monitored which showed an underlying rhythm of: Normal sinus rhythm   Consultations Obtained:  I requested consultation with the hospitalist,  and discussed lab and imaging findings as well as pertinent plan - they recommend: Admit   Problem  List / ED Course / Critical interventions / Medication management  Lymphedema, UTI, cellulitis I ordered medication including Rocephin for UTI  and saline Reevaluation of the patient after these medicines showed that the patient stayed the same I have reviewed the patients home medicines and have made adjustments as needed   Social Determinants of Health:  None   Test / Admission - Considered: No additional test needed patient is admitted  Patient with anasarca UTI and cellulitis to right lower leg.  She is started on Rocephin will be admitted to medicine        Final Clinical Impression(s) / ED Diagnoses Final diagnoses:  Anasarca  Acute cystitis without hematuria  Cellulitis of right lower extremity    Rx / DC Orders ED Discharge Orders     None         Milton Ferguson, MD 10/15/21 1037

## 2021-10-12 NOTE — Assessment & Plan Note (Signed)
Failed 5 days of outpt Keflex at this point with persistent cellulitis, purulent drainage from venous stasis ulcers in R leg. 1. Rocephin 2. Check MRSA PCR nares 3. Check wound culture.

## 2021-10-12 NOTE — Assessment & Plan Note (Addendum)
S/p prior radiation treatment.  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  No new findings as of CT head in March. No worsening seizure symptoms / neuro symptoms per pt. 1) cont home seizure meds: depakote, keppra 2) consider repeat imaging of CNS at some point in future?

## 2021-10-12 NOTE — Assessment & Plan Note (Signed)
With h/o mets to brain, s/p radiation treatment.

## 2021-10-13 DIAGNOSIS — Z803 Family history of malignant neoplasm of breast: Secondary | ICD-10-CM | POA: Diagnosis not present

## 2021-10-13 DIAGNOSIS — C07 Malignant neoplasm of parotid gland: Secondary | ICD-10-CM | POA: Diagnosis not present

## 2021-10-13 DIAGNOSIS — Z6841 Body Mass Index (BMI) 40.0 and over, adult: Secondary | ICD-10-CM | POA: Diagnosis not present

## 2021-10-13 DIAGNOSIS — I872 Venous insufficiency (chronic) (peripheral): Secondary | ICD-10-CM | POA: Diagnosis present

## 2021-10-13 DIAGNOSIS — F410 Panic disorder [episodic paroxysmal anxiety] without agoraphobia: Secondary | ICD-10-CM | POA: Diagnosis present

## 2021-10-13 DIAGNOSIS — Z7901 Long term (current) use of anticoagulants: Secondary | ICD-10-CM | POA: Diagnosis not present

## 2021-10-13 DIAGNOSIS — J9601 Acute respiratory failure with hypoxia: Secondary | ICD-10-CM | POA: Diagnosis present

## 2021-10-13 DIAGNOSIS — C7931 Secondary malignant neoplasm of brain: Secondary | ICD-10-CM | POA: Diagnosis present

## 2021-10-13 DIAGNOSIS — Z833 Family history of diabetes mellitus: Secondary | ICD-10-CM | POA: Diagnosis not present

## 2021-10-13 DIAGNOSIS — K219 Gastro-esophageal reflux disease without esophagitis: Secondary | ICD-10-CM | POA: Diagnosis present

## 2021-10-13 DIAGNOSIS — R601 Generalized edema: Secondary | ICD-10-CM | POA: Diagnosis present

## 2021-10-13 DIAGNOSIS — Z818 Family history of other mental and behavioral disorders: Secondary | ICD-10-CM | POA: Diagnosis not present

## 2021-10-13 DIAGNOSIS — I5033 Acute on chronic diastolic (congestive) heart failure: Secondary | ICD-10-CM | POA: Diagnosis present

## 2021-10-13 DIAGNOSIS — Z8249 Family history of ischemic heart disease and other diseases of the circulatory system: Secondary | ICD-10-CM | POA: Diagnosis not present

## 2021-10-13 DIAGNOSIS — Z79899 Other long term (current) drug therapy: Secondary | ICD-10-CM | POA: Diagnosis not present

## 2021-10-13 DIAGNOSIS — Z923 Personal history of irradiation: Secondary | ICD-10-CM | POA: Diagnosis not present

## 2021-10-13 DIAGNOSIS — Z86718 Personal history of other venous thrombosis and embolism: Secondary | ICD-10-CM | POA: Diagnosis not present

## 2021-10-13 DIAGNOSIS — R531 Weakness: Secondary | ICD-10-CM | POA: Diagnosis not present

## 2021-10-13 DIAGNOSIS — L03115 Cellulitis of right lower limb: Secondary | ICD-10-CM | POA: Diagnosis present

## 2021-10-13 DIAGNOSIS — E876 Hypokalemia: Secondary | ICD-10-CM | POA: Diagnosis present

## 2021-10-13 DIAGNOSIS — Z87891 Personal history of nicotine dependence: Secondary | ICD-10-CM | POA: Diagnosis not present

## 2021-10-13 DIAGNOSIS — Z90710 Acquired absence of both cervix and uterus: Secondary | ICD-10-CM | POA: Diagnosis not present

## 2021-10-13 DIAGNOSIS — G40909 Epilepsy, unspecified, not intractable, without status epilepticus: Secondary | ICD-10-CM | POA: Diagnosis present

## 2021-10-13 DIAGNOSIS — L97119 Non-pressure chronic ulcer of right thigh with unspecified severity: Secondary | ICD-10-CM | POA: Diagnosis present

## 2021-10-13 DIAGNOSIS — Z86711 Personal history of pulmonary embolism: Secondary | ICD-10-CM | POA: Diagnosis not present

## 2021-10-13 DIAGNOSIS — L8990 Pressure ulcer of unspecified site, unspecified stage: Secondary | ICD-10-CM | POA: Insufficient documentation

## 2021-10-13 DIAGNOSIS — F32A Depression, unspecified: Secondary | ICD-10-CM | POA: Diagnosis present

## 2021-10-13 LAB — CBC
HCT: 37.1 % (ref 36.0–46.0)
Hemoglobin: 11.5 g/dL — ABNORMAL LOW (ref 12.0–15.0)
MCH: 32.2 pg (ref 26.0–34.0)
MCHC: 31 g/dL (ref 30.0–36.0)
MCV: 103.9 fL — ABNORMAL HIGH (ref 80.0–100.0)
Platelets: 200 10*3/uL (ref 150–400)
RBC: 3.57 MIL/uL — ABNORMAL LOW (ref 3.87–5.11)
RDW: 15.5 % (ref 11.5–15.5)
WBC: 4.7 10*3/uL (ref 4.0–10.5)
nRBC: 0 % (ref 0.0–0.2)

## 2021-10-13 LAB — BASIC METABOLIC PANEL
Anion gap: 9 (ref 5–15)
BUN: <5 mg/dL — ABNORMAL LOW (ref 6–20)
CO2: 32 mmol/L (ref 22–32)
Calcium: 8.1 mg/dL — ABNORMAL LOW (ref 8.9–10.3)
Chloride: 99 mmol/L (ref 98–111)
Creatinine, Ser: 0.51 mg/dL (ref 0.44–1.00)
GFR, Estimated: >60 mL/min (ref >60)
Glucose, Bld: 102 mg/dL — ABNORMAL HIGH (ref 70–99)
Potassium: 3.3 mmol/L — ABNORMAL LOW (ref 3.5–5.1)
Sodium: 140 mmol/L (ref 135–145)

## 2021-10-13 MED ORDER — SODIUM CHLORIDE 0.9 % IV SOLN: INTRAVENOUS | Status: DC | PRN

## 2021-10-13 MED ORDER — FUROSEMIDE 10 MG/ML IJ SOLN
40.0000 mg | Freq: Two times a day (BID) | INTRAMUSCULAR | Status: DC
Start: 2021-10-13 — End: 2021-10-19
  Administered 2021-10-13 – 2021-10-19 (×12): 40 mg via INTRAVENOUS
  Filled 2021-10-13 (×13): qty 4

## 2021-10-13 NOTE — Consult Note (Signed)
[facility] Nurse Consult Note: Reason for Consult: Anasarca,  improving per MD note.  Cellulitis, is on Rocephin.  MD requesting compression.  I evaluated wounds and spoke with patient.  She is uncertain if she can tolerate compression but willing to try.  Will place antimicrobial topical dressing to red, nonintact areas  Will secure with unna boot.  Wound type: Anasarca Pressure Injury POA: NA Measurement: R lateral lower leg:  5 cm x 5 cm x 0.1 cm  R lateral malleolus 3 cm x 2 cm x 0.1 cm  Wound bed: ruddy red Drainage (amount, consistency, odor) moderate serous weeping Periwound:edema, dry skin Dressing procedure/placement/frequency: Bedside RN to cleanse bilateral lower legs with soap and water and pat dry. Apply Aquacel Ag to open/reddened areas ([facility] # [id number])  Ortho tech to wrap with zinc layer and secure with COban (ordered 2 rolls) #[id number] Change twice weekly.  Will not follow at this time.  Please re-consult if needed.  Domenic Moras MSN, RN, FNP-BC CWON Wound, Ostomy, Continence Nurse Pager [phone]

## 2021-10-13 NOTE — Progress Notes (Signed)
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  She was seen in the ED about 5 days ago, started on Keflex and Lasix, but did not improve, purulent drainage and erythema remained and came back to the hospital  Subjective / 24h Interval events: Doing well this morning, complains of lower extremity swelling and redness.  Does not appreciate significant improvement since yesterday.  Assesement and Plan: Principal Problem:   Generalized weakness Active Problems:   Anasarca   Cellulitis of right leg   Mass of temporal lobe   Cancer of parotid gland (HCC)   History of pulmonary embolus (PE)   Pressure injury of skin  Principal problem Lower extremity swelling, multifactorial in the setting of venous insufficiency, lymphedema, acute on chronic diastolic CHF-patient with significant swelling this morning, placed TED hoses, convert furosemide to IV.  Closely monitor potassium, magnesium, renal function while on IV diuresis. -She was evaluated by vascular surgery as an outpatient, recommending leg elevation, compression stockings but she has not been using those.  Active problems Acute on chronic diastolic CHF-most recent 2D echo done 5 days ago showed normal LVEF, grade 1 diastolic dysfunction, normal RV.  IV Lasix, monitor ins and outs, daily weights  Bilateral lower extremity DVT, history of PE-continue Eliquis  Right lower extremity cellulitis-she has small ulcer on the lateral side of her thigh, surrounding erythema.  Has been placed on  IV ceftriaxone, continue.  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  There is a degree of fluid overload as well  Generalized weakness-due to worsening anasarca, right lower extremity cellulitis.  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 Yes; Fluid consistency: Thin  Diet effective now       Question Answer Comment  Room service appropriate? Yes   Fluid consistency: Thin      10/12/21 1945            DVT prophylaxis: Place TED hose Start: 10/13/21 0824 apixaban (ELIQUIS) tablet 5 mg   Lab Results  Component Value Date   PLT 200 10/13/2021      Code Status: Full Code  Family Communication: No family at bedside  Status is: Observation  The patient will require care spanning > 2 midnights and  should be moved to inpatient because: IV diuresis, IV antibiotics   Level of care: Telemetry  Consultants:  none  Procedures:  none  Microbiology  none  Antimicrobials: Ceftriaxone 9/24 >>    Objective: Vitals:   10/12/21 2221 10/13/21 0201 10/13/21 0516 10/13/21 0905  BP: 129/77 121/80 125/72 (!) 155/92  Pulse: 93 93 97 94  Resp: 20 (!) 8 20 (!) 23  Temp: 97.8 F (36.6 C) 99.4 F (37.4 C) 98.7 F (37.1  C) 97.8 F (36.6 C)  TempSrc:  Oral Oral Oral  SpO2: 94% 96% 97% 94%  Weight:      Height:        Intake/Output Summary (Last 24 hours) at 10/13/2021 0762 Last data filed at 10/12/2021 2300 Gross per 24 hour  Intake 240 ml  Output --  Net 240 ml   Wt Readings from Last 3 Encounters:  10/12/21 107.4 kg  10/07/21 124.7 kg  09/30/21 124.7 kg    Examination:  Constitutional: NAD Eyes: no scleral icterus ENMT: Mucous membranes are moist.  Neck: normal, supple Respiratory: clear to auscultation bilaterally, no wheezing, no crackles. Normal respiratory effort. No accessory muscle use.  Cardiovascular: Regular rate and rhythm, no murmurs / rubs / gallops.  4+ pitting bilateral lower extremity edema Abdomen: non distended, no tenderness. Bowel sounds positive.  Musculoskeletal: no clubbing / cyanosis.  Skin: Right lower extremity cellulitis, small ulcer Neurologic: No focal deficits   Data Reviewed: I have independently reviewed following labs and imaging studies   CBC Recent Labs  Lab 10/07/21 1545 10/12/21 1535 10/13/21 0446  WBC 5.8 5.2 4.7  HGB 12.4 13.1 11.5*  HCT 39.2 41.9 37.1  PLT 267 279 200  MCV 100.5* 100.5* 103.9*  MCH 31.8 31.4 32.2  MCHC 31.6 31.3 31.0  RDW 15.4 15.2 15.5  LYMPHSABS 0.8  --   --   MONOABS 0.6  --   --   EOSABS 0.0  --   --   BASOSABS 0.1  --   --     Recent Labs  Lab 10/07/21 1545 10/12/21 1535 10/12/21 1805 10/13/21 0446  NA 141 137  --  140  K 3.5 3.0*  --  3.3*  CL 102 96*  --  99  CO2 32 27  --  32  GLUCOSE 96 115*  --  102*  BUN 9 <5*  --  <5*  CREATININE 0.58 0.52  --  0.51  CALCIUM 8.6* 8.2*  --  8.1*  AST 48*  --  36  --   ALT 92*  --  63*  --   ALKPHOS 71  --  76  --   BILITOT 0.4  --  0.5  --   ALBUMIN 3.0*  --  2.7*  --   LATICACIDVEN 2.3*  --   --   --     ------------------------------------------------------------------------------------------------------------------ No results for input(s): CHOL, HDL,  LDLCALC, TRIG, CHOLHDL, LDLDIRECT in the last 72 hours.  Lab Results  Component Value Date   HGBA1C 5.8 (H) 11/17/2020   ------------------------------------------------------------------------------------------------------------------ No results for input(s): TSH, T4TOTAL, T3FREE, THYROIDAB in the last 72 hours.  Invalid input(s): FREET3  Cardiac Enzymes No results for input(s): CKMB, TROPONINI, MYOGLOBIN in the last 168 hours.  Invalid input(s): CK ------------------------------------------------------------------------------------------------------------------    Component Value Date/Time   BNP 36.3 09/12/2021 2151    CBG: Recent Labs  Lab 10/12/21 1434  GLUCAP 145*    Recent Results (from the past 240 hour(s))  Blood Cultures x  2 sites     Status: None   Collection Time: 10/07/21  3:46 PM   Specimen: BLOOD  Result Value Ref Range Status   Specimen Description   Final    BLOOD LEFT ANTECUBITAL Performed at [facility] [address], [place], [place] [address]    Special Requests   Final    BOTTLES DRAWN AEROBIC AND ANAEROBIC Blood Culture adequate volume Performed at [facility] [address], [place], [place] [address]    Culture   Final    NO GROWTH 5 DAYS Performed at [facility], [place] [address] [place], [place] [address]    Report Status 10/12/2021 FINAL  Final  MRSA Next Gen by PCR, Nasal     Status: None   Collection Time: 10/12/21 10:19 PM   Specimen: Leg; Nasal Swab  Result Value Ref Range Status   MRSA by PCR Next Gen NOT DETECTED NOT DETECTED Final    Comment: (NOTE) The GeneXpert MRSA Assay (FDA approved for NASAL specimens only), is one component of a comprehensive MRSA colonization surveillance program. It is not intended to diagnose MRSA infection nor to guide or monitor treatment for MRSA infections. Test performance is not FDA approved in patients less than 48 years old. Performed at [facility], [place] [address], [place], [place] [address]   Aerobic Culture w Gram Stain (superficial specimen)     Status: None (Preliminary result)   Collection Time: 10/12/21 10:19 PM   Specimen: Leg  Result Value Ref Range Status   Specimen Description   Final    LEG Performed at [facility] [address], [place], [place] [address]    Special Requests   Final    NONE Performed at [facility], [place] [address], [place], [place] [address]    Gram Stain   Final    RARE SQUAMOUS EPITHELIAL CELLS PRESENT NO WBC SEEN RARE YEAST    Culture   Final    TOO YOUNG TO READ Performed at [facility], [place] [address] [place], [place] [address]    Report Status PENDING  Incomplete     Radiology Studies: DG Chest Port 1 View  Result Date: 10/12/2021 CLINICAL DATA:  Weakness EXAM: PORTABLE CHEST 1 VIEW COMPARISON:  Chest x-ray dated September 12, 2021; CT chest dated July 25, 2021 FINDINGS: Unchanged cardiac and mediastinal contours. Mild opacity of the right upper lung and bibasilar atelectasis, findings are similar to recent prior CT. No new focal opacity. No large pleural effusion or pneumothorax. IMPRESSION: Mild opacity of the right upper lung and bibasilar atelectasis, findings are similar to recent prior CT. No new focal opacity. Electronically Signed   By: Yetta Glassman M.D.   On: 10/12/2021 18:35     Marzetta Board, MD, PhD [facility]  Between 7 am - 7 pm I am available, please contact me via Amion (for emergencies) or Securechat (non urgent messages)  Between 7 pm - 7 am I am not available, please contact night coverage MD/APP via Amion

## 2021-10-13 NOTE — Progress Notes (Signed)
  Transition of Care [facility]) Screening Note   Patient Details  Name: Melissa Case Date of Birth: 1960/08/06   [facility] CM/SW Contact:    Dessa Phi, RN Phone Number: 10/13/2021, 9:15 AM    Transition of Care Department [facility]) has reviewed patient and no TOC needs have been identified at this time. We will continue to monitor patient advancement through interdisciplinary progression rounds. If new patient transition needs arise, please place a TOC consult.

## 2021-10-14 DIAGNOSIS — R531 Weakness: Secondary | ICD-10-CM | POA: Diagnosis not present

## 2021-10-14 LAB — POTASSIUM: Potassium: 3.1 mmol/L — ABNORMAL LOW (ref 3.5–5.1)

## 2021-10-14 LAB — URINE CULTURE: Culture: >=100000 — AB

## 2021-10-14 LAB — BASIC METABOLIC PANEL
Anion gap: 11 (ref 5–15)
BUN: <5 mg/dL — ABNORMAL LOW (ref 6–20)
CO2: 33 mmol/L — ABNORMAL HIGH (ref 22–32)
Calcium: 8.3 mg/dL — ABNORMAL LOW (ref 8.9–10.3)
Chloride: 98 mmol/L (ref 98–111)
Creatinine, Ser: 0.51 mg/dL (ref 0.44–1.00)
GFR, Estimated: >60 mL/min (ref >60)
Glucose, Bld: 106 mg/dL — ABNORMAL HIGH (ref 70–99)
Potassium: 2.3 mmol/L — CL (ref 3.5–5.1)
Sodium: 142 mmol/L (ref 135–145)

## 2021-10-14 LAB — CBC
HCT: 36.6 % (ref 36.0–46.0)
Hemoglobin: 11.5 g/dL — ABNORMAL LOW (ref 12.0–15.0)
MCH: 31.3 pg (ref 26.0–34.0)
MCHC: 31.4 g/dL (ref 30.0–36.0)
MCV: 99.5 fL (ref 80.0–100.0)
Platelets: 242 10*3/uL (ref 150–400)
RBC: 3.68 MIL/uL — ABNORMAL LOW (ref 3.87–5.11)
RDW: 15.3 % (ref 11.5–15.5)
WBC: 4.5 10*3/uL (ref 4.0–10.5)
nRBC: 0 % (ref 0.0–0.2)

## 2021-10-14 LAB — MAGNESIUM: Magnesium: 1.8 mg/dL (ref 1.7–2.4)

## 2021-10-14 MED ORDER — AMOXICILLIN 250 MG PO CAPS
500.0000 mg | ORAL_CAPSULE | Freq: Once | ORAL | Status: AC
  Administered 2021-10-14: 500 mg via ORAL
  Filled 2021-10-14: qty 2

## 2021-10-14 MED ORDER — EPINEPHRINE 0.3 MG/0.3ML IJ SOAJ: 0.3000 mg | Freq: Once | INTRAMUSCULAR | Status: DC | PRN

## 2021-10-14 MED ORDER — POTASSIUM CHLORIDE 20 MEQ PO PACK
40.0000 meq | PACK | ORAL | Status: AC
  Administered 2021-10-14 (×3): 40 meq via ORAL
  Filled 2021-10-14 (×3): qty 2

## 2021-10-14 MED ORDER — POTASSIUM CHLORIDE CRYS ER 20 MEQ PO TBCR
40.0000 meq | EXTENDED_RELEASE_TABLET | ORAL | Status: DC
  Administered 2021-10-14: 40 meq via ORAL
  Filled 2021-10-14: qty 2

## 2021-10-14 MED ORDER — AMOXICILLIN 250 MG PO CAPS
500.0000 mg | ORAL_CAPSULE | Freq: Three times a day (TID) | ORAL | Status: DC
  Administered 2021-10-14 – 2021-10-17 (×8): 500 mg via ORAL
  Filled 2021-10-14 (×8): qty 2

## 2021-10-14 MED ORDER — DIPHENHYDRAMINE HCL 50 MG/ML IJ SOLN: 25.0000 mg | Freq: Once | INTRAMUSCULAR | Status: DC | PRN

## 2021-10-14 MED ORDER — POTASSIUM CHLORIDE 20 MEQ PO PACK
40.0000 meq | PACK | ORAL | Status: AC
  Administered 2021-10-14 (×2): 40 meq via ORAL
  Filled 2021-10-14 (×2): qty 2

## 2021-10-14 MED ORDER — POTASSIUM CHLORIDE 10 MEQ/100ML IV SOLN
10.0000 meq | INTRAVENOUS | Status: DC
  Administered 2021-10-14: 10 meq via INTRAVENOUS
  Filled 2021-10-14 (×2): qty 100

## 2021-10-14 NOTE — TOC Initial Note (Addendum)
Transition of Care [facility]) - Initial/Assessment Note    Patient Details  Name: Melissa Case MRN: [id number] Date of Birth: 11-16-1960  Transition of Care [facility]) CM/SW Contact:    Dessa Phi, RN Phone Number: 10/14/2021, 2:12 PM  Clinical Narrative: Recc ST SNF-patient in agreement-faxed out-await bed offers, prior auth. Patient would like to get [employer] 2nd booster while in hospital-MD updated.                Expected Discharge Plan: Skilled Nursing Facility Barriers to Discharge: Continued Medical Work up   Patient Goals and CMS Choice Patient states their goals for this hospitalization and ongoing recovery are:: Rehab CMS Medicare.gov Compare Post Acute Care list provided to:: Patient Choice offered to / list presented to : Patient  Expected Discharge Plan and Services Expected Discharge Plan: [place]   Discharge Planning Services: CM Consult Post Acute Care Choice: [place] Living arrangements for the past 2 months: Single Family Home                                      Prior Living Arrangements/Services Living arrangements for the past 2 months: Single Family Home Lives with:: Spouse Patient language and need for interpreter reviewed:: Yes Do you feel safe going back to the place where you live?: Yes      Need for Family Participation in Patient Care: Yes (Comment) Care giver support system in place?: Yes (comment)   Criminal Activity/Legal Involvement Pertinent to Current Situation/Hospitalization: No - Comment as needed  Activities of Daily Living Home Assistive Devices/Equipment: Eyeglasses, Environmental consultant (specify type), Wheelchair, Shower chair with back ADL Screening (condition at time of admission) Patient's cognitive ability adequate to safely complete daily activities?: Yes Is the patient deaf or have difficulty hearing?: Yes (trouble hearing out of left ear and right ear feels clogged up per pt) Does the patient have  difficulty seeing, even when wearing glasses/contacts?: No Does the patient have difficulty concentrating, remembering, or making decisions?: No Patient able to express need for assistance with ADLs?: Yes Does the patient have difficulty dressing or bathing?: No Independently performs ADLs?: No Communication: Independent Dressing (OT): Independent Grooming: Independent Feeding: Independent Bathing: Independent Toileting: Needs assistance Is this a change from baseline?: Pre-admission baseline In/Out Bed: Needs assistance Is this a change from baseline?: Pre-admission baseline Walks in Home: Needs assistance Is this a change from baseline?: Pre-admission baseline Does the patient have difficulty walking or climbing stairs?: Yes Weakness of Legs: Both Weakness of Arms/Hands: None  Permission Sought/Granted Permission sought to share information with : Case Manager Permission granted to share information with : Yes, Verbal Permission Granted  Share Information with NAME: Case Manager           Emotional Assessment Appearance:: Appears stated age Attitude/Demeanor/Rapport: Gracious Affect (typically observed): Accepting Orientation: : Oriented to Self, Oriented to Place, Oriented to  Time, Oriented to Situation Alcohol / Substance Use: Not Applicable Psych Involvement: No (comment)  Admission diagnosis:  Anasarca [R60.1] Cellulitis of right lower extremity [L03.115] Acute cystitis without hematuria [N30.00] Acute on chronic diastolic CHF (congestive heart failure) (HCC) [I50.33] Patient Active Problem List   Diagnosis Date Noted   Pressure injury of skin 10/13/2021   Acute on chronic diastolic CHF (congestive heart failure) ([place]) 10/13/2021   Anasarca 10/12/2021   Generalized weakness 10/12/2021   History of pulmonary embolus (PE) 10/12/2021   Cellulitis of right leg  10/12/2021   Acute respiratory failure with hypoxia [facility])    Pulmonary emboli ([place]) 05/11/2021   DVT  (deep venous thrombosis) ([place]) 05/10/2021   Prediabetes 03/04/2021   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   Cerebral edema ([place]) 12/30/2020   Seizure ([place]) 11/17/2020   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   History of radiation to head and neck region 09/16/2019   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 [place] [address] Phone: [phone] Fax: [phone]     Social Determinants of Health (SDOH) Interventions    Readmission Risk Interventions     View : No data to display.

## 2021-10-14 NOTE — NC FL2 (Signed)
[place] LEVEL OF CARE SCREENING TOOL     IDENTIFICATION  Patient Name: Melissa Case Birthdate: June 21, 1960 Sex: female Admission Date (Current Location): 10/12/2021  [facility] and [place] Number:  Herbalist and Address:  [facility],  [address] [place], [place]      Provider Number: [id number]  Attending Physician Name and Address:  Caren Griffins, MD  Relative Name and Phone Number:  Golda Zavalza) [phone]    Current Level of Care: Hospital Recommended Level of Care: [place] Prior Approval Number:    Date Approved/Denied:   PASRR Number: [id number]   Discharge Plan: SNF    Current Diagnoses: Patient Active Problem List   Diagnosis Date Noted   Pressure injury of skin 10/13/2021   Acute on chronic diastolic CHF (congestive heart failure) ([place]) 10/13/2021   Anasarca 10/12/2021   Generalized weakness 10/12/2021   History of pulmonary embolus (PE) 10/12/2021   Cellulitis of right leg 10/12/2021   Acute respiratory failure with hypoxia [facility])    Pulmonary emboli ([place]) 05/11/2021   DVT (deep venous thrombosis) ([place]) 05/10/2021   Prediabetes 03/04/2021   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   Cerebral edema ([place]) 12/30/2020   Seizure ([place]) 11/17/2020   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   History of radiation to head and neck region 09/16/2019   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   Lesion of parotid gland 07/14/2019   Acute calculous cholecystitis 11/21/2017   Gastro-esophageal reflux disease without esophagitis 02/03/2014   Abdominal pain 09/30/2013   SBO (small bowel obstruction) ([place]) 09/30/2013   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/05/2012   Gall stone 04/05/2012   Barrett's esophagus 04/05/2012   Cholelithiasis 03/27/2012   Hyperlipidemia 03/25/2012   Depression 09/15/2011   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 Twice a week.)                       Personal Care Assistance Level of Assistance  Bathing, Feeding, Dressing Bathing Assistance: Limited assistance Feeding assistance: Limited assistance Dressing Assistance: Limited assistance     Functional Limitations Info  Sight, Hearing, Speech Sight Info: Impaired (eyeglasses) Hearing Info: Adequate Speech Info: Adequate    SPECIAL CARE FACTORS FREQUENCY  PT (By licensed PT), OT (By licensed OT)     PT Frequency:  (5x week) OT Frequency:  (5x week)            Contractures Contractures Info: Not present    Additional Factors Info  Code Status, Allergies, Psychotropic Code Status Info:  (Full) Allergies Info:  (Morphine,penicillin) Psychotropic Info:  (Paxil;Trazadone-see MAR)         Current Medications (10/14/2021):  This is the current hospital active medication list Current Facility-Administered Medications  Medication Dose Route Frequency Provider Last Rate Last Admin   0.9 %  sodium chloride infusion   Intravenous PRN Caren Griffins, MD   Stopped at 10/14/21 0738   acetaminophen (TYLENOL) tablet 650 mg  650 mg Oral Q6H PRN Etta Quill, DO       Or  acetaminophen (TYLENOL) suppository 650 mg  650 mg Rectal Q6H PRN Etta Quill, DO       apixaban Arne Cleveland) tablet 5 mg  5 mg Oral BID Jennette Kettle M, DO   5 mg at 10/14/21 0806   cefTRIAXone (ROCEPHIN) 2 g in sodium chloride 0.9 % 100 mL IVPB  2 g Intravenous Q24H Caren Griffins, MD 200 mL/hr at 10/13/21 1849 2 g at 10/13/21 1849   diphenhydrAMINE (BENADRYL) injection 25 mg  25 mg Intravenous Once PRN Caren Griffins, MD       divalproex (DEPAKOTE) DR tablet 250 mg  250 mg Oral BID Etta Quill, DO   250 mg at 10/14/21 9892   divalproex (DEPAKOTE) DR tablet 500 mg  500 mg Oral BID Jennette Kettle M, DO   500 mg at 10/14/21 1194   EPINEPHrine (EPI-PEN) injection 0.3 mg  0.3 mg Intramuscular Once PRN Caren Griffins, MD       furosemide (LASIX) injection 40 mg  40 mg Intravenous BID Caren Griffins, MD   40 mg at 10/14/21 1740   levETIRAcetam (KEPPRA) tablet 500 mg  500 mg Oral BID Etta Quill, DO   500 mg at 10/14/21 8144   lip balm (CARMEX) ointment   Topical PRN Etta Quill, DO       ondansetron [facility]) tablet 4 mg  4 mg Oral Q6H PRN Etta Quill, DO   4 mg at 10/14/21 1404   Or   ondansetron (ZOFRAN) injection 4 mg  4 mg Intravenous Q6H PRN Etta Quill, DO       pantoprazole (PROTONIX) EC tablet 80 mg  80 mg Oral Q1200 Etta Quill, DO   80 mg at 10/14/21 1215   PARoxetine (PAXIL) tablet 40 mg  40 mg Oral Daily Jennette Kettle M, DO   40 mg at 10/14/21 8185   traZODone (DESYREL) tablet 50 mg  50 mg Oral QHS PRN Etta Quill, DO         Discharge Medications: Please see discharge summary for a list of discharge medications.  Relevant Imaging Results:  Relevant Lab Results:   Additional Information SSN: [id number]; [place] COVID-19 Vaccine 09/15/2019 , 08/22/2019  Dessa Phi, RN

## 2021-10-14 NOTE — NC FL2 (Signed)
[place] LEVEL OF CARE SCREENING TOOL     IDENTIFICATION  Patient Name: Melissa Case Birthdate: Sep 29, 1960 Sex: female Admission Date (Current Location): 10/12/2021  [facility] and [place] Number:  Herbalist and Address:  [facility]  [place], [place]      Provider Number: [id number]  Attending Physician Name and Address:  Caren Griffins, MD  Relative Name and Phone Number:  [place]) [phone]    Current Level of Care: Hospital Recommended Level of Care: [place] Prior Approval Number:    Date Approved/Denied:   PASRR Number:    Discharge Plan: SNF    Current Diagnoses: Patient Active Problem List   Diagnosis Date Noted   Pressure injury of skin 10/13/2021   Acute on chronic diastolic CHF (congestive heart failure) ([place]) 10/13/2021   Anasarca 10/12/2021   Generalized weakness 10/12/2021   History of pulmonary embolus (PE) 10/12/2021   Cellulitis of right leg 10/12/2021   Acute respiratory failure with hypoxia [facility])    Pulmonary emboli ([place]) 05/11/2021   DVT (deep venous thrombosis) ([place]) 05/10/2021   Prediabetes 03/04/2021   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   Cerebral edema ([place]) 12/30/2020   Seizure ([place]) 11/17/2020   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   History of radiation to head and neck region 09/16/2019   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   Lesion of parotid gland 07/14/2019   Acute calculous cholecystitis 11/21/2017   Gastro-esophageal reflux disease without esophagitis 02/03/2014   Abdominal pain 09/30/2013   SBO (small bowel obstruction) ([place]) 09/30/2013   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/05/2012   Gall stone 04/05/2012   Barrett's esophagus 04/05/2012   Cholelithiasis 03/27/2012   Hyperlipidemia 03/25/2012   Depression 09/15/2011   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 Twice a week.)                       Personal Care Assistance Level of Assistance  Bathing, Feeding, Dressing Bathing Assistance: Limited assistance Feeding assistance: Limited assistance Dressing Assistance: Limited assistance     Functional Limitations Info  Sight, Hearing, Speech Sight Info: Impaired (eyeglasses) Hearing Info: Adequate Speech Info: Adequate    SPECIAL CARE FACTORS FREQUENCY  PT (By licensed PT), OT (By licensed OT)     PT Frequency:  (5x week) OT Frequency:  (5x week)            Contractures Contractures Info: Not present    Additional Factors Info  Code Status, Allergies, Psychotropic Code Status Info:  (Full) Allergies Info:  (Morphine,penicillin) Psychotropic Info:  (Paxil;Trazadone-see MAR)         Current Medications (10/14/2021):  This is the current hospital active medication list Current Facility-Administered Medications  Medication Dose Route Frequency Provider Last Rate Last Admin   0.9 %  sodium chloride infusion   Intravenous PRN Caren Griffins, MD   Stopped at 10/14/21 0738   acetaminophen (TYLENOL) tablet 650 mg  650 mg Oral Q6H PRN Etta Quill, DO       Or  acetaminophen (TYLENOL) suppository 650 mg  650 mg Rectal Q6H PRN Etta Quill, DO       amoxicillin (AMOXIL) capsule 500 mg  500 mg Oral Once Caren Griffins, MD       apixaban Arne Cleveland) tablet 5 mg  5 mg Oral BID Jennette Kettle M, DO   5 mg at 10/14/21 5681   cefTRIAXone (ROCEPHIN) 2 g in sodium chloride 0.9 % 100 mL IVPB  2 g Intravenous Q24H Caren Griffins, MD 200 mL/hr at 10/13/21 1849 2 g at 10/13/21 1849    diphenhydrAMINE (BENADRYL) injection 25 mg  25 mg Intravenous Once PRN Caren Griffins, MD       divalproex (DEPAKOTE) DR tablet 250 mg  250 mg Oral BID Etta Quill, DO   250 mg at 10/14/21 2751   divalproex (DEPAKOTE) DR tablet 500 mg  500 mg Oral BID Jennette Kettle M, DO   500 mg at 10/14/21 7001   EPINEPHrine (EPI-PEN) injection 0.3 mg  0.3 mg Intramuscular Once PRN Caren Griffins, MD       furosemide (LASIX) injection 40 mg  40 mg Intravenous BID Caren Griffins, MD   40 mg at 10/14/21 7494   levETIRAcetam (KEPPRA) tablet 500 mg  500 mg Oral BID Etta Quill, DO   500 mg at 10/14/21 4967   lip balm (CARMEX) ointment   Topical PRN Etta Quill, DO       ondansetron [facility]) tablet 4 mg  4 mg Oral Q6H PRN Etta Quill, DO   4 mg at 10/14/21 5916   Or   ondansetron (ZOFRAN) injection 4 mg  4 mg Intravenous Q6H PRN Etta Quill, DO       pantoprazole (PROTONIX) EC tablet 80 mg  80 mg Oral Q1200 Etta Quill, DO   80 mg at 10/14/21 1215   PARoxetine (PAXIL) tablet 40 mg  40 mg Oral Daily Jennette Kettle M, DO   40 mg at 10/14/21 3846   potassium chloride (KLOR-CON) packet 40 mEq  40 mEq Oral Q3H Caren Griffins, MD   40 mEq at 10/14/21 1215   traZODone (DESYREL) tablet 50 mg  50 mg Oral QHS PRN Etta Quill, DO         Discharge Medications: Please see discharge summary for a list of discharge medications.  Relevant Imaging Results:  Relevant Lab Results:   Additional Information SSN: [id number]; [place] COVID-19 Vaccine 09/15/2019 , 08/22/2019  Dessa Phi, RN

## 2021-10-14 NOTE — Progress Notes (Signed)
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  She was seen in the ED about 5 days ago, started on Keflex and Lasix, but did not improve, purulent drainage and erythema remained and came back to the hospital  Subjective / 24h Interval events: Has no complaints for me.  Denies any abdominal pain, nausea or vomiting.  No shortness of breath.  Assesement and Plan: Principal Problem:   Generalized weakness Active Problems:   Anasarca   Cellulitis of right leg   Mass of temporal lobe   Cancer of parotid gland (HCC)   History of pulmonary embolus (PE)   Pressure injury of skin   Acute on chronic diastolic CHF (congestive heart failure) (HCC)  Principal problem Lower extremity swelling, multifactorial in the setting of venous insufficiency, lymphedema, acute on chronic diastolic CHF-patient with persistent significant swelling, wound care saw patient, Unna boot ordered but was not placed for unknown reasons.  Still remains with significant edema, continue IV diuresis. -She was evaluated by vascular surgery as an outpatient, recommending leg elevation, compression stockings but she has not been using those.  Active problems Hypokalemia-due to diuresis. Replete aggressively, recheck in the afternoon  Acute on chronic diastolic CHF-most recent 2D echo done 5 days ago showed normal LVEF, grade 1 diastolic dysfunction, normal RV.  IV Lasix, monitor ins and outs, daily weights.  Net 900 cc today, weight similar at 236.772 days apart, I doubt this  is real.  Bilateral lower extremity DVT, history of PE-continue Eliquis  Right lower extremity cellulitis-she has small ulcer on the lateral side of her thigh, surrounding erythema.  Continue ceftriaxone.  Depression-continue home paroxetine  Obesity, class III-based on BMI of 40.6, she would benefit from weight loss.  There is a degree of fluid overload as well  Generalized weakness-due to worsening anasarca, right lower extremity cellulitis.  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, status post XRT  Scheduled Meds:  apixaban  5 mg Oral BID   divalproex  250 mg Oral BID   divalproex  500 mg Oral BID   furosemide  40 mg Intravenous BID   levETIRAcetam  500 mg Oral BID   pantoprazole  80 mg Oral Q1200   PARoxetine  40 mg Oral Daily   potassium chloride  40 mEq Oral Q3H   Continuous Infusions:  sodium chloride Stopped (10/14/21 0738)   cefTRIAXone (ROCEPHIN)  IV 2 g (10/13/21 1849)   PRN Meds:.sodium chloride, acetaminophen **OR** acetaminophen, lip balm, ondansetron **OR** ondansetron (ZOFRAN) IV, traZODone  Diet Orders (From admission, onward)     Start     Ordered   10/12/21 1945  Diet Heart Room service appropriate? Yes; Fluid consistency: Thin  Diet effective now       Question Answer Comment  Room service appropriate? Yes   Fluid consistency: Thin      10/12/21 1945            DVT prophylaxis: Place TED hose Start: 10/13/21 0824 apixaban (ELIQUIS) tablet 5 mg   Lab Results  Component Value Date   PLT 242 10/14/2021      Code Status: Full Code  Family Communication: No family at bedside  Status is: Inpatient   Level of care: Telemetry  Consultants:  none  Procedures:  none  Microbiology  none  Antimicrobials: Ceftriaxone 9/24 >>    Objective: Vitals:   10/13/21 0905 10/13/21 1229 10/14/21 0500 10/14/21 0800  BP: (!) 155/92 104/71    Pulse: 94 97     Resp: (!) '23 19  18  '$ Temp: 97.8 F (36.6 C) 97.7 F (36.5 C)    TempSrc: Oral     SpO2: 94% 100%  95%  Weight:   107.4 kg   Height:        Intake/Output Summary (Last 24 hours) at 10/14/2021 1134 Last data filed at 10/14/2021 1000 Gross per 24 hour  Intake 636.86 ml  Output 2150 ml  Net -1513.14 ml    Wt Readings from Last 3 Encounters:  10/14/21 107.4 kg  10/07/21 124.7 kg  09/30/21 124.7 kg    Examination:  Constitutional: NAD Eyes: Anicteric ENMT: mmm Neck: normal, supple Respiratory: CTA bilaterally no wheezing or crackles Cardiovascular: Regular rate and rhythm, no murmurs, 3+ pitting edema Abdomen: NT, ND, BS positive Musculoskeletal: no clubbing / cyanosis.  Skin: Right lower extremity cellulitis, small ulcer, no new rashes Neurologic: Nonfocal   Data Reviewed: I have independently reviewed following labs and imaging studies   CBC Recent Labs  Lab 10/07/21 1545 10/12/21 1535 10/13/21 0446 10/14/21 0531  WBC 5.8 5.2 4.7 4.5  HGB 12.4 13.1 11.5* 11.5*  HCT 39.2 41.9 37.1 36.6  PLT 267 279 200 242  MCV 100.5* 100.5* 103.9* 99.5  MCH 31.8 31.4 32.2 31.3  MCHC 31.6 31.3 31.0 31.4  RDW 15.4 15.2 15.5 15.3  LYMPHSABS 0.8  --   --   --   MONOABS 0.6  --   --   --   EOSABS 0.0  --   --   --   BASOSABS 0.1  --   --   --      Recent Labs  Lab 10/07/21 1545 10/12/21 1535 10/12/21 1805 10/13/21 0446 10/14/21 0531  NA 141 137  --  140 142  K 3.5 3.0*  --  3.3* 2.3*  CL 102 96*  --  99 98  CO2 32 27  --  32 33*  GLUCOSE 96 115*  --  102* 106*  BUN 9 <5*  --  <5* <5*  CREATININE 0.58 0.52  --  0.51 0.51  CALCIUM 8.6* 8.2*  --  8.1* 8.3*  AST 48*  --  36  --   --   ALT 92*  --  63*  --   --   ALKPHOS 71  --  76  --   --   BILITOT 0.4  --  0.5  --   --   ALBUMIN 3.0*  --  2.7*  --   --   MG  --   --   --   --  1.8  LATICACIDVEN 2.3*  --   --   --   --       ------------------------------------------------------------------------------------------------------------------ No results for input(s): CHOL, HDL, LDLCALC, TRIG, CHOLHDL, LDLDIRECT in the last 72 hours.  Lab Results  Component Value Date   HGBA1C 5.8 (H) 11/17/2020   ------------------------------------------------------------------------------------------------------------------ No results for input(s): TSH, T4TOTAL, T3FREE, THYROIDAB in the last 72 hours.  Invalid input(s): FREET3  Cardiac Enzymes No results  for input(s): CKMB, TROPONINI, MYOGLOBIN in the last 168 hours.  Invalid input(s): CK ------------------------------------------------------------------------------------------------------------------    Component Value Date/Time   BNP 36.3 09/12/2021 2151    CBG: Recent Labs  Lab 10/12/21 1434  GLUCAP 145*     Recent Results (from the past 240 hour(s))  Blood Cultures x 2 sites     Status: None   Collection Time: 10/07/21  3:46 PM   Specimen: BLOOD  Result Value Ref Range Status   Specimen Description   Final    BLOOD LEFT ANTECUBITAL Performed at [facility] [address] [place], [place] [address]    Special Requests   Final    BOTTLES DRAWN AEROBIC AND ANAEROBIC Blood Culture adequate volume Performed at [facility] [address], [place], [place] [address]    Culture   Final    NO GROWTH 5 DAYS Performed at [facility], [place] [address], [place], [place] [address]    Report Status 10/12/2021 FINAL  Final  Urine Culture     Status: Abnormal   Collection Time: 10/12/21  3:11 PM   Specimen: Urine, Clean Catch  Result Value Ref Range Status   Specimen Description   Final    URINE, CLEAN CATCH Performed at [facility], [place] [address], [place], [place] [address]    Special Requests   Final    NONE Performed at [facility], [place] [address],  [place], [place] [address]    Culture (A)  Final    >=100,000 COLONIES/mL ENTEROBACTER AEROGENES >=100,000 COLONIES/mL KLEBSIELLA PNEUMONIAE    Report Status 10/14/2021 FINAL  Final   Organism ID, Bacteria ENTEROBACTER AEROGENES (A)  Final   Organism ID, Bacteria KLEBSIELLA PNEUMONIAE (A)  Final      Susceptibility   Enterobacter aerogenes - MIC*    CEFAZOLIN >=64 RESISTANT Resistant     CEFEPIME <=0.12 SENSITIVE Sensitive     CEFTRIAXONE <=0.25 SENSITIVE Sensitive     CIPROFLOXACIN <=0.25 SENSITIVE Sensitive     GENTAMICIN <=1 SENSITIVE Sensitive     IMIPENEM 2 SENSITIVE Sensitive     NITROFURANTOIN 128 RESISTANT Resistant     TRIMETH/SULFA <=20 SENSITIVE Sensitive     PIP/TAZO <=4 SENSITIVE Sensitive     * >=100,000 COLONIES/mL ENTEROBACTER AEROGENES   Klebsiella pneumoniae - MIC*    AMPICILLIN >=32 RESISTANT Resistant     CEFAZOLIN <=4 SENSITIVE Sensitive     CEFEPIME <=0.12 SENSITIVE Sensitive     CEFTRIAXONE <=0.25 SENSITIVE Sensitive     CIPROFLOXACIN <=0.25 SENSITIVE Sensitive     GENTAMICIN <=1 SENSITIVE Sensitive     IMIPENEM <=0.25 SENSITIVE Sensitive     NITROFURANTOIN 64 INTERMEDIATE Intermediate     TRIMETH/SULFA <=20 SENSITIVE Sensitive     AMPICILLIN/SULBACTAM 4 SENSITIVE Sensitive     PIP/TAZO <=4 SENSITIVE Sensitive     * >=100,000 COLONIES/mL KLEBSIELLA PNEUMONIAE  MRSA Next Gen by PCR, Nasal     Status: None   Collection Time: 10/12/21 10:19 PM   Specimen: Leg; Nasal Swab  Result Value Ref Range Status   MRSA by PCR Next Gen NOT DETECTED NOT DETECTED Final    Comment: (NOTE) The GeneXpert MRSA Assay (FDA approved for NASAL specimens only), is one component of a comprehensive MRSA colonization surveillance program. It is not intended to diagnose MRSA infection nor to guide or monitor treatment for MRSA infections. Test performance is not FDA approved in patients less than 40 years old. Performed at [facility]  [place] [address] [place], [place] [address]   Aerobic Culture w Gram Stain (superficial specimen)     Status: None (Preliminary result)   Collection Time: 10/12/21 10:19 PM   Specimen: Leg  Result Value Ref Range Status   Specimen Description   Final    LEG Performed at [facility] [address], [place], [place] [address]    Special Requests   Final    NONE Performed at [facility], [place] [address] [place], [place] [address]    Gram Stain   Final    RARE SQUAMOUS EPITHELIAL CELLS PRESENT NO WBC SEEN RARE YEAST    Culture   Final    CULTURE REINCUBATED FOR BETTER GROWTH Performed at [facility], [place] [address], [place], [place] [address]    Report Status PENDING  Incomplete      Radiology Studies: No results found.   Marzetta Board, MD, PhD [facility]  Between 7 am - 7 pm I am available, please contact me via Amion (for emergencies) or Securechat (non urgent messages)  Between 7 pm - 7 am I am not available, please contact night coverage MD/APP via Amion

## 2021-10-14 NOTE — Evaluation (Signed)
Physical Therapy Evaluation Patient Details Name: Melissa Case MRN: [id number] DOB: 1960/08/12 Today's Date: 10/14/2021  History of Present Illness  Patient is 61 y.o. female presenting with worsening venous stasis ulcers as well as increased redness in the lower extremities.  She was seen in the ED ~5 days PTA, started on Keflex and Lasix, pt has improved swelling, but purulent drainage and erythema continue despite keflex so pt came back to the hospital. 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   Clinical Impression   Pt presents from home with generalized weakness and LE wounds. PTA pt was mobilizing household distances at Mod independent level but recently required assist from spouse due to weakness. Pt currently requires Min-Mod assist with bed mobility, transfers, and ambulation using RW. Pt limited by fatigue and anxiety related to DOE during gait and was only able to ambulate ~10'. Recommending ST rehab at SNF as pt is unable to mobilize independently and pt's spouse is working. Pt/spouse agreeable to SNF. She will continue to benefit from skilled PT interventions to progress mobility as able.    Recommendations for follow up therapy are one component of a multi-disciplinary discharge planning process, led by the attending physician.  Recommendations may be updated based on patient status, additional functional criteria and insurance authorization.  Follow Up Recommendations Skilled nursing-short term rehab (<3 hours/day)    Assistance Recommended at Discharge Frequent or constant Supervision/Assistance  Patient can return home with the following       Equipment Recommendations    Recommendations for Other Services  OT consult    Functional Status Assessment Patient has had a recent decline in their  functional status and demonstrates the ability to make significant improvements in function in a reasonable and predictable amount of time.     Precautions / Restrictions Precautions Precautions: Fall Restrictions Weight Bearing Restrictions: No      Mobility  Bed Mobility Overal bed mobility: Needs Assistance Bed Mobility: Supine to Sit     Supine to sit: Mod assist, HOB elevated     General bed mobility comments: Pt able to bring LE 's to EOB without assist, MOD assist required to press up trunk to sit at EOB    Transfers Overall transfer level: Needs assistance Equipment used: Rolling walker (2 wheels) Transfers: Sit to/from Stand Sit to Stand: Mod assist, From elevated surface                Ambulation/Gait Ambulation/Gait assistance: Mod assist Gait Distance (Feet): 10 Feet Assistive device: Rolling walker (2 wheels), IV Pole Gait Pattern/deviations: Step-through pattern, Decreased step length - right, Decreased step length - left, Decreased stride length, Wide base of support, Trunk flexed Gait velocity: decr     General Gait Details: pt walks with flexed posture and head down, Mod assist to steady with Mod verbal cues to manage walker and deter pt from advancing walker too far ahead. Pt fatigued quickly and seated rest required. HR into 120-130's with activity, SpO2 was 94%.  Stairs            Wheelchair Mobility    Modified Rankin (Stroke Patients Only)       Balance Overall balance assessment: Needs assistance, History of Falls, Mild deficits observed, not formally tested Sitting-balance support: Bilateral upper extremity supported Sitting balance-Leahy Scale: Good Sitting balance - Comments: Pt sits upright in bed and  chair   Standing balance support: Bilateral upper extremity supported, Reliant on assistive device for balance Standing balance-Leahy Scale: Poor Standing balance comment: reliant on RW                              Pertinent Vitals/Pain Pain Assessment Pain Assessment: No/denies pain Pain Intervention(s): Monitored during session, Repositioned    Home Living Family/patient expects to be discharged to:: Private residence Living Arrangements: Spouse/significant other Available Help at Discharge: Family;Available PRN/intermittently Type of Home: House Home Access: Stairs to enter Entrance Stairs-Rails: Left;Right;Can reach both Entrance Stairs-Number of Steps: 4   Home Layout: One level Home Equipment: Conservation officer, nature (2 wheels);Transport chair;Shower seat Additional Comments: pt limited to living room and bathroom    Prior Function Prior Level of Function : Needs assist             Mobility Comments: prior to seizure, pt typically Independent in all mobility though since seizure one year ago, has required intermittent use of RW for mobility, w/c assist outside of home; reports at least one recent fall ADLs Comments: Prior to seizure one year ago, pt was completely Independent with ADls, IADLs and was working full time in a warehouse. After seizure, pt with increased difficulty bathing, dressing and toilet transfers (improved when BSC placed over toilet) and has been sponge bathing rather than showering.     Hand Dominance   Dominant Hand: Right    Extremity/Trunk Assessment   Upper Extremity Assessment Upper Extremity Assessment: Generalized weakness;Defer to OT evaluation    Lower Extremity Assessment Lower Extremity Assessment: Generalized weakness    Cervical / Trunk Assessment Cervical / Trunk Assessment: Kyphotic  Communication   Communication: No difficulties  Cognition   Behavior During Therapy: Flat affect, Anxious   Area of Impairment: Safety/judgement, Following commands, Awareness, Problem solving, Attention                   Current Attention Level: Sustained   Following Commands: Follows multi-step commands inconsistently, Follows multi-step  commands with increased time, Follows one step commands consistently, Follows one step commands with increased time Safety/Judgement: Decreased awareness of safety, Decreased awareness of deficits Awareness: Emergent Problem Solving: Slow processing, Difficulty sequencing, Requires verbal cues, Requires tactile cues          General Comments General comments (skin integrity, edema, etc.): Pt presented with poor skin integrity on the right anterior lower leg, discoloration of erythema.    Exercises     Assessment/Plan    PT Assessment Patient needs continued PT services  PT Problem List Decreased strength;Decreased range of motion;Decreased activity tolerance;Decreased balance;Decreased mobility;Decreased knowledge of use of DME;Obesity;Cardiopulmonary status limiting activity;Decreased safety awareness       PT Treatment Interventions DME instruction;Gait training;Stair training;Functional mobility training;Therapeutic activities;Therapeutic exercise;Balance training;Neuromuscular re-education;Patient/family education;Cognitive remediation    PT Goals (Current goals can be found in the Care Plan section)  Acute Rehab PT Goals Patient Stated Goal: get home to her dog PT Goal Formulation: With patient/family Time For Goal Achievement: 10/28/21 Potential to Achieve Goals: Good    Frequency Min 2X/week     Co-evaluation               AM-PAC PT "6 Clicks" Mobility  Outcome Measure Help needed turning from your back to your side while in a flat bed without using bedrails?: A Little Help needed moving from lying on your back to sitting on the side of a  flat bed without using bedrails?: A Lot Help needed moving to and from a bed to a chair (including a wheelchair)?: A Little Help needed standing up from a chair using your arms (e.g., wheelchair or bedside chair)?: A Little Help needed to walk in hospital room?: A Lot Help needed climbing 3-5 steps with a railing? : Total 6  Click Score: 14    End of Session Equipment Utilized During Treatment: Gait belt Activity Tolerance: Patient tolerated treatment well Patient left: in chair;with call bell/phone within reach;with chair alarm set;with family/visitor present Nurse Communication: Mobility status PT Visit Diagnosis: Unsteadiness on feet (R26.81);Muscle weakness (generalized) (M62.81);Difficulty in walking, not elsewhere classified (R26.2)    Time: 0920-0949 PT Time Calculation (min) (ACUTE ONLY): 29 min   Charges:   PT Evaluation $PT Eval Low Complexity: 1 Low PT Treatments $Gait Training: 8-22 mins        Margie Ege, SPT [facility] 10/14/2021, 1:21 PM

## 2021-10-14 NOTE — Progress Notes (Signed)
Penicillin Allergy Clarification: Amoxicillin Oral Challenge  History of allergy:  Low Risk - childhood allergy as rash, per patient she was 61 years old. Has tolerated cephalexin.  Type of intervention (select all that apply):  Amoxicillin Oral Challenge - plan discussed with both patient and husband  Impact on therapy (select all that apply): TBD - will f/u on tolerance. If tolerated - will plan to remove and de-label PCN allergy  Plan - Amoxicillin 500 mg po x 1 dose - Prn benadryl + epi-pen if needed - RN to check vitals q39mn for 1 hour post dose - Per discussion with Dr. GRenne Crigler- if tolerates - will plan to continue Amoxicillin 500 mg tid for SSTI  Thank you for allowing pharmacy to be a part of this patient's care.  EAlycia Rossetti PharmD, BCPS Infectious Diseases Clinical Pharmacist 10/14/2021 1:58 PM   **Pharmacist phone directory can now be found on [email or web address] ([email or web address]).  Listed under [place]

## 2021-10-14 NOTE — Progress Notes (Signed)
Await choice.   1. 1.3 mi [facility] [place], [place] [address] [phone] Overall rating Average 2. 1.6 mi [facility] [place], [place] [address] [phone] Overall rating Much below average 3. 2.1 mi [place], [place] [address] [phone] Overall rating Much below average 4. 2.5 mi [facility] [address] [place], [place] [address] [phone] Overall rating Below average 5. 2.8 mi [facility] [place], [place] [address] [phone] Overall rating Above average 6. 2.8 mi [facility] [address] [place], [place] [address] [phone] Overall rating Much below average 7. [place], [place] [address] [phone] Overall rating Much above average 8. 3.6 [place] [address] [place], [place] [address] [phone] Overall rating Average 9. 3.6 mi [facility] [address] [place], [place] [address] [phone] Overall rating Much below average 10. 3.9 mi [facility] [place], [place] [address] [phone] Overall rating Much below average 11. 4.4 mi [facility] at [place], [place] [address] [phone] Overall rating Much above average 12. 5.5 mi [facility] [address] [place], [place] [address] [phone] Overall rating Above average 13. 8.2 [facility] [place], [place] [address] [phone] Overall rating Much above average 14. 9 mi [facility] [address] [place], [place] [address] [phone] Overall rating Above average 15. 9.1 mi [facility] and [place], [place] [address] [phone] Overall rating Below average 16. 9.2 mi [facility] [address] [place], [place] [address] [phone] Overall rating Much above average 17. 10.8 mi [place] [facility] [address] [place], [place] [address] [phone] Overall rating Much above average 18. 12.6 mi [facility] [address] [place], [place] [address] [phone] Overall rating Much below average 19. 12.8 [facility] [place], [place] [address] [phone] Overall rating Much below average 20. 14.2 mi [facility] [address] [place], [place] [address] [phone] Overall rating Below average 21. 14.4 mi [facility] [place], [place] [address] [phone] Overall rating Above average 22. 14.8 mi [place] [facility] [place], [place] [address] [phone] Overall rating Much above average 23. 14.9 mi [facility] [address] [place], [place] [address] [phone] Overall rating Much below average 24. 16.5 mi [place] [address] [place], [place] [address] [phone] Overall rating Above average 25. 16.7 mi [facility] [place], [place] [address] [phone] Overall rating Average 26. 17.9 mi [place], [place] [address] [phone] Overall rating Below average 27. 18.1 mi [facility] [address] [place], [place] [address] [phone] Overall rating Much below average 28. 19.7 mi [place] [address] [place],  [address] [phone] Overall rating Much below average 29.  [place] at [employer] at [facility], [place] [address] [phone] Overall rating Much above average 30. 21.1 mi [place], [place] [address] [phone] Overall rating Much below average 31. 21.6 [address] [place], [place] [address] [phone] Overall rating Below average 32. 21.6 [place] [facility] [address] [place], [place] [address] [phone] Overall rating Below average 33. 21.8 [place], [place] [address] [phone] Overall rating Much above average 34. [address] [place], [place] [address] [phone] Overall rating Much above average 35. 22.6 mi [facility] [address] [place], [place] [address] [phone] Overall rating Below average 36. 22.7 mi [facility] [place], [place] [address] [phone] Overall rating Much below average 37. 23.3 mi [facility] [address] [place], [place] [address] [phone] Overall rating Above average 38. 23.5 mi [place], [place] [address] [phone] Overall rating Not available18 39. 24.1 mi [place] [address] [place], [place] [address] [phone] Overall rating Average 40. 24.2 mi [place] [address] [place], [place] [address] [phone] Overall rating Below average 41. 24.4 [facility] [address] [place], [place] [address] [phone] Overall rating Much below average 42. 24.5 mi [facility] [place], [place] [address] [phone] Overall rating Average To explore and download nursing hom

## 2021-10-14 NOTE — Progress Notes (Signed)
Penicillin Allergy Clarification: Amoxicillin Oral Challenge  History of allergy:  Low Risk - childhood allergy as rash, per patient she was 61 years old. Has tolerated cephalexin.  Type of intervention (select all that apply):  Amoxicillin Oral Challenge - plan discussed with both patient and husband  Impact on therapy (select all that apply): TBD - will f/u on tolerance. If tolerated - will plan to remove and de-label PCN allergy  Plan - Amoxicillin 500 mg po x 1 dose - Prn benadryl + epi-pen if needed - RN to check vitals q28mn for 1 hour post dose - Per discussion with Dr. GRenne Crigler- if tolerates - will plan to continue Amoxicillin 500 mg tid for SSTI  Thank you for allowing pharmacy to be a part of this patient's care.  EAlycia Rossetti PharmD, BCPS Infectious Diseases Clinical Pharmacist 10/14/2021 1:57 PM   **Pharmacist phone directory can now be found on [email or web address] ([email or web address]).  Listed under [place]

## 2021-10-14 NOTE — Progress Notes (Signed)
Orthopedic Tech Progress Note Patient Details:  [name] May 13, 1961 [id number] Aquacel was placed on patient's leg by nurse and then the unna boots were applied  Ortho Devices Type of Ortho Device: Haematologist Ortho Device/Splint Location: Bi LE Ortho Device/Splint Interventions: Application   Post Interventions Patient Tolerated: Well  [name] [name] 10/14/2021, 12:34 PM

## 2021-10-15 DIAGNOSIS — R531 Weakness: Secondary | ICD-10-CM | POA: Diagnosis not present

## 2021-10-15 LAB — BASIC METABOLIC PANEL
Anion gap: 10 (ref 5–15)
BUN: <5 mg/dL — ABNORMAL LOW (ref 6–20)
CO2: 33 mmol/L — ABNORMAL HIGH (ref 22–32)
Calcium: 8.3 mg/dL — ABNORMAL LOW (ref 8.9–10.3)
Chloride: 96 mmol/L — ABNORMAL LOW (ref 98–111)
Creatinine, Ser: 0.55 mg/dL (ref 0.44–1.00)
GFR, Estimated: >60 mL/min (ref >60)
Glucose, Bld: 90 mg/dL (ref 70–99)
Potassium: 3.2 mmol/L — ABNORMAL LOW (ref 3.5–5.1)
Sodium: 139 mmol/L (ref 135–145)

## 2021-10-15 LAB — CBC
HCT: 35.9 % — ABNORMAL LOW (ref 36.0–46.0)
Hemoglobin: 11.5 g/dL — ABNORMAL LOW (ref 12.0–15.0)
MCH: 31.4 pg (ref 26.0–34.0)
MCHC: 32 g/dL (ref 30.0–36.0)
MCV: 98.1 fL (ref 80.0–100.0)
Platelets: 230 10*3/uL (ref 150–400)
RBC: 3.66 MIL/uL — ABNORMAL LOW (ref 3.87–5.11)
RDW: 15.5 % (ref 11.5–15.5)
WBC: 4.5 10*3/uL (ref 4.0–10.5)
nRBC: 0 % (ref 0.0–0.2)

## 2021-10-15 MED ORDER — POTASSIUM CHLORIDE 20 MEQ PO PACK
40.0000 meq | PACK | ORAL | Status: AC
  Administered 2021-10-15 (×2): 40 meq via ORAL
  Filled 2021-10-15 (×2): qty 2

## 2021-10-15 MED ORDER — NICOTINE 21 MG/24HR TD PT24
21.0000 mg | MEDICATED_PATCH | Freq: Every day | TRANSDERMAL | Status: DC
  Administered 2021-10-15 – 2021-10-18 (×4): 21 mg via TRANSDERMAL
  Filled 2021-10-15 (×5): qty 1

## 2021-10-15 NOTE — Progress Notes (Signed)
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  She was seen in the ED about 5 days ago, started on Keflex and Lasix, but did not improve, purulent drainage and erythema remained and came back to the hospital  Subjective / 24h Interval events: She is doing well this morning, denies any chest pain, no shortness of breath.  No fever or chills.  Assesement and Plan: Principal Problem:   Generalized weakness Active Problems:   Anasarca   Cellulitis of right leg   Mass of temporal lobe   Cancer of parotid gland (HCC)   History of pulmonary embolus (PE)   Pressure injury of skin   Acute on chronic diastolic CHF (congestive heart failure) (HCC)  Principal problem Lower extremity swelling, multifactorial in the setting of venous insufficiency, lymphedema, acute on chronic diastolic CHF-patient with persistent significant swelling, wound care saw patient, now she has Unna boot in place.  Edema better, continue IV Lasix. -She was evaluated by vascular surgery as an outpatient, recommending leg elevation, compression stockings but she has not been using those. -Due to weakness from her leg swelling SNF was recommended and this is pending insurance authorization  Active problems Hypokalemia-continue to replete this morning, continue IV Lasix.  Monitor potassium tomorrow  Acute on chronic diastolic CHF-most recent 2D echo done 5 days ago showed normal LVEF, grade 1 diastolic dysfunction, normal RV.  IV Lasix, monitor ins and outs, daily weights.   Weight improving from 236 on admission to 233 this morning  Bilateral lower extremity DVT, history of PE-continue Eliquis  Right lower extremity cellulitis-she has small ulcer on the lateral side of her thigh, surrounding erythema.  Wound cultures growing Enterococcus, currently on amoxicillin, sensitivities pending  Depression-continue home paroxetine  Obesity, class III-based on BMI of 40.6, she would benefit from weight loss.  There is a degree of fluid overload as well  Generalized weakness-due to worsening anasarca, right lower extremity cellulitis.  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, status post XRT  Scheduled Meds:  amoxicillin  500 mg Oral Q8H   apixaban  5 mg Oral BID   divalproex  250 mg Oral BID   divalproex  500 mg Oral BID   furosemide  40 mg Intravenous BID   levETIRAcetam  500 mg Oral BID   pantoprazole  80 mg Oral Q1200   PARoxetine  40 mg Oral Daily   potassium chloride  40 mEq Oral Q3H   Continuous Infusions:  sodium chloride 10 mL/hr at 10/15/21 0556   PRN Meds:.sodium chloride, acetaminophen **OR** acetaminophen, diphenhydrAMINE, EPINEPHrine, lip balm, ondansetron **OR** ondansetron (ZOFRAN) IV, traZODone  Diet Orders (From admission, onward)     Start     Ordered   10/12/21 1945  Diet Heart Room service appropriate? Yes; Fluid consistency: Thin  Diet effective now       Question Answer Comment  Room service appropriate? Yes   Fluid consistency: Thin      10/12/21 1945  DVT prophylaxis: Place TED hose Start: 10/13/21 0824 apixaban (ELIQUIS) tablet 5 mg   Lab Results  Component Value Date   PLT 230 10/15/2021      Code Status: Full Code  Family Communication: No family at bedside  Status is: Inpatient   Level of care: Telemetry  Consultants:  none  Procedures:  none  Microbiology  none  Antimicrobials: Ceftriaxone 9/24 >>     Objective: Vitals:   10/14/21 1802 10/14/21 2111 10/15/21 0500 10/15/21 0559  BP:  140/75  128/78  Pulse:  96  87  Resp:  18  18  Temp:  98.7 F (37.1 C)  97.8 F (36.6 C)  TempSrc:  Oral  Oral  SpO2: 91% 92%  100%  Weight:   105.8 kg   Height:        Intake/Output Summary (Last 24 hours) at 10/15/2021 0933 Last data filed at 10/15/2021 0556 Gross per 24 hour  Intake 550.36 ml  Output 950 ml  Net -399.64 ml    Wt Readings from Last 3 Encounters:  10/15/21 105.8 kg  10/07/21 124.7 kg  09/30/21 124.7 kg    Examination:  Constitutional: NAD Eyes: No icterus ENMT: mmm Neck: normal, supple Respiratory: CTA bilaterally, no wheezing or crackles Cardiovascular: Regular rate and rhythm, normal's, 2+ pitting edema Abdomen: Soft, NT, ND, bowel sounds positive Musculoskeletal: no clubbing / cyanosis.  Skin: Right lower extremity cellulitis, small ulcer, no new rashes Neurologic: No focal deficits   Data Reviewed: I have independently reviewed following labs and imaging studies   CBC Recent Labs  Lab 10/12/21 1535 10/13/21 0446 10/14/21 0531 10/15/21 0451  WBC 5.2 4.7 4.5 4.5  HGB 13.1 11.5* 11.5* 11.5*  HCT 41.9 37.1 36.6 35.9*  PLT 279 200 242 230  MCV 100.5* 103.9* 99.5 98.1  MCH 31.4 32.2 31.3 31.4  MCHC 31.3 31.0 31.4 32.0  RDW 15.2 15.5 15.3 15.5     Recent Labs  Lab 10/12/21 1535 10/12/21 1805 10/13/21 0446 10/14/21 0531 10/14/21 1540 10/15/21 0451  NA 137  --  140 142  --  139  K 3.0*  --  3.3* 2.3* 3.1* 3.2*  CL 96*  --  99 98  --  96*  CO2 27  --  32 33*  --  33*  GLUCOSE 115*  --  102* 106*  --  90  BUN <5*  --  <5* <5*  --  <5*  CREATININE 0.52  --  0.51 0.51  --  0.55  CALCIUM 8.2*  --  8.1* 8.3*  --  8.3*  AST  --  36  --   --   --   --   ALT  --  63*  --   --   --   --   ALKPHOS  --  76  --   --   --   --   BILITOT  --  0.5  --   --   --   --   ALBUMIN  --  2.7*  --   --   --   --   MG  --   --   --  1.8  --   --       ------------------------------------------------------------------------------------------------------------------ No results for input(s): CHOL, HDL, LDLCALC, TRIG, CHOLHDL, LDLDIRECT in the last 72 hours.  Lab Results  Component Value Date   HGBA1C 5.8 (H) 11/17/2020   ------------------------------------------------------------------------------------------------------------------ No results for input(s): TSH, T4TOTAL, T3FREE, THYROIDAB in the last 72 hours.  Invalid input(s): FREET3  Cardiac Enzymes No results for input(s): CKMB, TROPONINI, MYOGLOBIN in the last 168 hours.  Invalid input(s): CK ------------------------------------------------------------------------------------------------------------------    Component Value Date/Time   BNP 36.3 09/12/2021 2151    CBG: Recent Labs  Lab 10/12/21 1434  GLUCAP 145*     Recent Results (from the past 240 hour(s))  Blood Cultures x 2 sites     Status: None   Collection Time: 10/07/21  3:46 PM   Specimen: BLOOD  Result Value Ref Range Status   Specimen Description   Final    BLOOD LEFT ANTECUBITAL Performed at [place] [address] [place], [place] [address]    Special Requests   Final    BOTTLES DRAWN AEROBIC AND ANAEROBIC Blood Culture adequate volume Performed at [place] [address], [place], [place] [address]    Culture   Final    NO GROWTH 5 DAYS Performed at [facility], [place] [address] [place], [place] [address]    Report Status 10/12/2021 FINAL  Final  Urine Culture     Status: Abnormal   Collection Time: 10/12/21  3:11 PM   Specimen: Urine, Clean Catch  Result Value Ref Range Status   Specimen Description   Final    URINE, CLEAN CATCH Performed at [facility], [place] [address] [place], [place] [address]    Special Requests   Final    NONE Performed at [facility], [place] [address],  [place], [place] [address]    Culture (A)  Final    >=100,000 COLONIES/mL ENTEROBACTER AEROGENES >=100,000 COLONIES/mL KLEBSIELLA PNEUMONIAE    Report Status 10/14/2021 FINAL  Final   Organism ID, Bacteria ENTEROBACTER AEROGENES (A)  Final   Organism ID, Bacteria KLEBSIELLA PNEUMONIAE (A)  Final      Susceptibility   Enterobacter aerogenes - MIC*    CEFAZOLIN >=64 RESISTANT Resistant     CEFEPIME <=0.12 SENSITIVE Sensitive     CEFTRIAXONE <=0.25 SENSITIVE Sensitive     CIPROFLOXACIN <=0.25 SENSITIVE Sensitive     GENTAMICIN <=1 SENSITIVE Sensitive     IMIPENEM 2 SENSITIVE Sensitive     NITROFURANTOIN 128 RESISTANT Resistant     TRIMETH/SULFA <=20 SENSITIVE Sensitive     PIP/TAZO <=4 SENSITIVE Sensitive     * >=100,000 COLONIES/mL ENTEROBACTER AEROGENES   Klebsiella pneumoniae - MIC*    AMPICILLIN >=32 RESISTANT Resistant     CEFAZOLIN <=4 SENSITIVE Sensitive     CEFEPIME <=0.12 SENSITIVE Sensitive     CEFTRIAXONE <=0.25 SENSITIVE Sensitive     CIPROFLOXACIN <=0.25 SENSITIVE Sensitive     GENTAMICIN <=1 SENSITIVE Sensitive     IMIPENEM <=0.25 SENSITIVE Sensitive     NITROFURANTOIN 64 INTERMEDIATE Intermediate     TRIMETH/SULFA <=20 SENSITIVE Sensitive     AMPICILLIN/SULBACTAM 4 SENSITIVE Sensitive     PIP/TAZO <=4 SENSITIVE Sensitive     * >=100,000 COLONIES/mL KLEBSIELLA PNEUMONIAE  MRSA Next Gen by PCR, Nasal     Status: None   Collection Time: 10/12/21 10:19 PM   Specimen: Leg; Nasal Swab  Result Value Ref Range Status   MRSA by PCR Next Gen NOT DETECTED NOT DETECTED Final    Comment: (NOTE) The GeneXpert MRSA Assay (FDA approved for NASAL specimens only), is one component of a comprehensive MRSA colonization surveillance program. It is not intended to diagnose MRSA infection nor to guide or monitor treatment for MRSA infections. Test performance is not FDA approved in patients  less than 60 years old. Performed at [facility], [place] [address], [place], [place] [address]   Aerobic Culture w Gram Stain (superficial specimen)     Status: None (Preliminary result)   Collection Time: 10/12/21 10:19 PM   Specimen: Leg  Result Value Ref Range Status   Specimen Description   Final    LEG Performed at [place] [address] [place], [place] [address]    Special Requests   Final    NONE Performed at [facility], [place] [address], [place], [place] [address]    Gram Stain   Final    RARE SQUAMOUS EPITHELIAL CELLS PRESENT NO WBC SEEN RARE YEAST    Culture   Final    FEW ENTEROCOCCUS FAECALIS SUSCEPTIBILITIES TO FOLLOW Performed at [facility], [place] [address], [place], [place] [address]    Report Status PENDING  Incomplete      Radiology Studies: No results found.   Marzetta Board, MD, PhD [facility]  Between 7 am - 7 pm I am available, please contact me via Amion (for emergencies) or Securechat (non urgent messages)  Between 7 pm - 7 am I am not available, please contact night coverage MD/APP via Amion

## 2021-10-16 ENCOUNTER — Inpatient Hospital Stay (HOSPITAL_COMMUNITY): Payer: BC Managed Care – PPO

## 2021-10-16 DIAGNOSIS — R531 Weakness: Secondary | ICD-10-CM | POA: Diagnosis not present

## 2021-10-16 LAB — BASIC METABOLIC PANEL
Anion gap: 13 (ref 5–15)
BUN: <5 mg/dL — ABNORMAL LOW (ref 6–20)
CO2: 30 mmol/L (ref 22–32)
Calcium: 8.2 mg/dL — ABNORMAL LOW (ref 8.9–10.3)
Chloride: 94 mmol/L — ABNORMAL LOW (ref 98–111)
Creatinine, Ser: 0.55 mg/dL (ref 0.44–1.00)
GFR, Estimated: >60 mL/min (ref >60)
Glucose, Bld: 93 mg/dL (ref 70–99)
Potassium: 3.1 mmol/L — ABNORMAL LOW (ref 3.5–5.1)
Sodium: 137 mmol/L (ref 135–145)

## 2021-10-16 LAB — CBC
HCT: 41 % (ref 36.0–46.0)
Hemoglobin: 13.2 g/dL (ref 12.0–15.0)
MCH: 32.1 pg (ref 26.0–34.0)
MCHC: 32.2 g/dL (ref 30.0–36.0)
MCV: 99.8 fL (ref 80.0–100.0)
Platelets: 232 10*3/uL (ref 150–400)
RBC: 4.11 MIL/uL (ref 3.87–5.11)
RDW: 15.8 % — ABNORMAL HIGH (ref 11.5–15.5)
WBC: 3.8 10*3/uL — ABNORMAL LOW (ref 4.0–10.5)
nRBC: 0 % (ref 0.0–0.2)

## 2021-10-16 LAB — MAGNESIUM: Magnesium: 1.8 mg/dL (ref 1.7–2.4)

## 2021-10-16 MED ORDER — POTASSIUM CHLORIDE 20 MEQ PO PACK
40.0000 meq | PACK | Freq: Once | ORAL | Status: AC
  Administered 2021-10-16: 40 meq via ORAL
  Filled 2021-10-16: qty 2

## 2021-10-16 MED ORDER — MAGNESIUM SULFATE 2 GM/50ML IV SOLN
2.0000 g | Freq: Once | INTRAVENOUS | Status: AC
  Administered 2021-10-16: 2 g via INTRAVENOUS
  Filled 2021-10-16: qty 50

## 2021-10-16 MED ORDER — POTASSIUM CHLORIDE 20 MEQ PO PACK
40.0000 meq | PACK | ORAL | Status: DC
  Administered 2021-10-16: 40 meq via ORAL
  Filled 2021-10-16: qty 2

## 2021-10-16 NOTE — Progress Notes (Signed)
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  She was seen in the ED about 5 days ago, started on Keflex and Lasix, but did not improve, purulent drainage and erythema remained and came back to the hospital  Subjective / 24h Interval events: Complains of nausea today.  Started yesterday.  Assesement and Plan: Principal Problem:   Generalized weakness Active Problems:   Anasarca   Cellulitis of right leg   Mass of temporal lobe   Cancer of parotid gland (HCC)   History of pulmonary embolus (PE)   Pressure injury of skin   Acute on chronic diastolic CHF (congestive heart failure) (HCC)  Principal problem Lower extremity swelling, multifactorial in the setting of venous insufficiency, lymphedema, acute on chronic diastolic CHF-patient with persistent significant swelling, wound care saw patient, now she has Unna boot in place.  Overall improving, she appears to be down 7 pounds and legs look significantly better.  Husband tells me that he thinks she is down about 40 pounds from where the last knew her weight -She was evaluated by vascular surgery as an outpatient, recommending leg elevation, compression stockings but she has not been using those. -Due to weakness from her leg swelling SNF was recommended and this is pending insurance authorization  Active problems Hypokalemia-3.1, continue to replete, continue IV Lasix.  Magnesium normal today  Acute on chronic diastolic CHF-most recent 2D echo done 5 days ago showed normal LVEF,  grade 1 diastolic dysfunction, normal RV.  IV Lasix, monitor ins and outs, daily weights.  Weight improving from 236 on admission to 229 this morning  Bilateral lower extremity DVT, history of PE-continue Eliquis  Right lower extremity cellulitis-she has small ulcer on the lateral side of her thigh, surrounding erythema.  Wound cultures growing Enterococcus, pansensitive, continue amoxicillin, favor 7 to 10-day course  Depression-continue home paroxetine  Obesity, class II-based on BMI of 39, she would benefit from weight loss.  There is a degree of fluid overload  Generalized weakness-due to worsening anasarca, right lower extremity cellulitis.  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, status post XRT  Scheduled Meds:  amoxicillin  500 mg Oral Q8H   apixaban  5 mg Oral BID   divalproex  250 mg Oral BID   divalproex  500 mg Oral BID   furosemide  40 mg Intravenous BID   levETIRAcetam  500 mg Oral BID   nicotine  21 mg Transdermal Daily   pantoprazole  80 mg Oral Q1200   PARoxetine  40 mg Oral Daily   potassium chloride  40 mEq Oral Q3H   Continuous Infusions:  sodium chloride 10 mL/hr at 10/15/21 0556   magnesium sulfate bolus IVPB 2 g (10/16/21 0842)   PRN Meds:.sodium chloride, acetaminophen **OR** acetaminophen, diphenhydrAMINE, EPINEPHrine, lip balm, ondansetron **OR** ondansetron (ZOFRAN) IV, traZODone  Diet Orders (From admission, onward)     Start     Ordered   10/12/21 1945  Diet Heart Room service appropriate? Yes; Fluid consistency: Thin  Diet effective now       Question Answer Comment  Room service appropriate? Yes   Fluid consistency: Thin      10/12/21 1945            DVT prophylaxis: Place TED hose Start: 10/13/21 0824 apixaban (ELIQUIS) tablet 5 mg   Lab Results  Component Value Date   PLT 232 10/16/2021      Code  Status: Full Code  Family Communication: Husband present at bedside  Status is: Inpatient  Level of care: Telemetry  Consultants:  none  Procedures:  none  Microbiology  none  Antimicrobials: Ceftriaxone 9/24 >>    Objective: Vitals:   10/15/21 1514 10/15/21 2021 10/16/21 0500 10/16/21 0615  BP: 109/62 118/67  137/81  Pulse: 91 96  84  Resp:  18  18  Temp:  98.2 F (36.8 C)  (!) 97.4 F (36.3 C)  TempSrc:  Oral  Oral  SpO2: 97% 100%  100%  Weight:   104.1 kg   Height:        Intake/Output Summary (Last 24 hours) at 10/16/2021 7408 Last data filed at 10/16/2021 0600 Gross per 24 hour  Intake 549.43 ml  Output 1100 ml  Net -550.57 ml    Wt Readings from Last 3 Encounters:  10/16/21 104.1 kg  10/07/21 124.7 kg  09/30/21 124.7 kg    Examination:  Constitutional: NAD Eyes: lids and conjunctivae normal, no scleral icterus ENMT: mmm Neck: normal, supple Respiratory: clear to auscultation bilaterally, no wheezing, no crackles. Normal respiratory effort.  Cardiovascular: Regular rate and rhythm, no murmurs / rubs / gallops.  1+ edema Abdomen: soft, no distention, no tenderness. Bowel sounds positive.  Skin: Unna boots on lower extremities Neurologic: no focal deficits, equal strength   Data Reviewed: I have independently reviewed following labs and imaging studies   CBC Recent Labs  Lab 10/12/21 1535 10/13/21 0446 10/14/21 0531 10/15/21 0451 10/16/21 0440  WBC 5.2 4.7 4.5 4.5 3.8*  HGB 13.1 11.5* 11.5* 11.5* 13.2  HCT 41.9 37.1 36.6 35.9* 41.0  PLT 279 200 242 230 232  MCV 100.5* 103.9* 99.5 98.1 99.8  MCH 31.4 32.2 31.3 31.4 32.1  MCHC 31.3 31.0 31.4 32.0 32.2  RDW 15.2 15.5 15.3 15.5 15.8*     Recent Labs  Lab 10/12/21 1535 10/12/21 1805 10/13/21 0446 10/14/21 0531 10/14/21 1540 10/15/21 0451 10/16/21 0440  NA 137  --  140 142  --  139 137  K 3.0*  --  3.3* 2.3* 3.1* 3.2* 3.1*  CL 96*  --  99 98  --  96* 94*  CO2 27  --  32 33*   --  33* 30  GLUCOSE 115*  --  102* 106*  --  90 93  BUN <5*  --  <5* <5*  --  <5* <5*  CREATININE 0.52  --  0.51 0.51  --  0.55 0.55  CALCIUM 8.2*  --  8.1* 8.3*  --  8.3* 8.2*  AST  --  36  --   --   --   --   --   ALT  --  63*  --   --   --   --   --   ALKPHOS  --  76  --   --   --   --   --   BILITOT  --  0.5  --   --   --   --   --   ALBUMIN  --  2.7*  --   --   --   --   --   MG  --   --   --  1.8  --   --  1.8     ------------------------------------------------------------------------------------------------------------------ No results for input(s): CHOL, HDL, LDLCALC, TRIG, CHOLHDL, LDLDIRECT in the last 72 hours.  Lab Results  Component Value Date   HGBA1C 5.8 (H) 11/17/2020   ------------------------------------------------------------------------------------------------------------------ No results for input(s): TSH, T4TOTAL, T3FREE, THYROIDAB in the last 72 hours.  Invalid input(s): FREET3  Cardiac Enzymes No results for input(s): CKMB, TROPONINI, MYOGLOBIN in the last 168 hours.  Invalid input(s): CK ------------------------------------------------------------------------------------------------------------------    Component Value Date/Time   BNP 36.3 09/12/2021 2151    CBG: Recent Labs  Lab 10/12/21 1434  GLUCAP 145*     Recent Results (from the past 240 hour(s))  Blood Cultures x 2 sites     Status: None   Collection Time: 10/07/21  3:46 PM   Specimen: BLOOD  Result Value Ref Range Status   Specimen Description   Final    BLOOD LEFT ANTECUBITAL Performed at [facility] [address] [place], [place] [address]    Special Requests   Final    BOTTLES DRAWN AEROBIC AND ANAEROBIC Blood Culture adequate volume Performed at [facility] [address] [place], [place] [address]    Culture   Final    NO GROWTH 5 DAYS Performed at [facility], [place] [address], [place], [place] [address]    Report  Status 10/12/2021 FINAL  Final  Urine Culture     Status: Abnormal   Collection Time: 10/12/21  3:11 PM   Specimen: Urine, Clean Catch  Result Value Ref Range Status   Specimen Description   Final    URINE, CLEAN CATCH Performed at [facility], [place] [address], [place], [place] [address]    Special Requests   Final    NONE Performed at [facility], [place] [address], [place], [place] [address]    Culture (A)  Final    >=100,000 COLONIES/mL ENTEROBACTER AEROGENES >=100,000 COLONIES/mL KLEBSIELLA PNEUMONIAE    Report Status 10/14/2021 FINAL  Final   Organism ID, Bacteria ENTEROBACTER AEROGENES (A)  Final   Organism ID, Bacteria KLEBSIELLA PNEUMONIAE (A)  Final      Susceptibility   Enterobacter aerogenes - MIC*    CEFAZOLIN >=64 RESISTANT Resistant     CEFEPIME <=0.12 SENSITIVE Sensitive     CEFTRIAXONE <=0.25 SENSITIVE Sensitive     CIPROFLOXACIN <=0.25 SENSITIVE Sensitive     GENTAMICIN <=1 SENSITIVE Sensitive     IMIPENEM 2 SENSITIVE Sensitive     NITROFURANTOIN 128 RESISTANT Resistant     TRIMETH/SULFA <=20 SENSITIVE Sensitive     PIP/TAZO <=4 SENSITIVE Sensitive     * >=100,000 COLONIES/mL ENTEROBACTER AEROGENES   Klebsiella pneumoniae - MIC*    AMPICILLIN >=32 RESISTANT Resistant     CEFAZOLIN <=4 SENSITIVE Sensitive     CEFEPIME <=0.12 SENSITIVE Sensitive     CEFTRIAXONE <=0.25 SENSITIVE Sensitive     CIPROFLOXACIN <=0.25 SENSITIVE Sensitive     GENTAMICIN <=1 SENSITIVE Sensitive     IMIPENEM <=0.25 SENSITIVE Sensitive     NITROFURANTOIN 64 INTERMEDIATE Intermediate     TRIMETH/SULFA <=20 SENSITIVE Sensitive     AMPICILLIN/SULBACTAM 4 SENSITIVE Sensitive     PIP/TAZO <=4 SENSITIVE Sensitive     * >=100,000 COLONIES/mL KLEBSIELLA PNEUMONIAE  MRSA Next Gen by PCR, Nasal  Status: None   Collection Time: 10/12/21 10:19 PM   Specimen: Leg; Nasal Swab  Result Value Ref Range Status   MRSA by PCR Next Gen NOT DETECTED NOT DETECTED  Final    Comment: (NOTE) The GeneXpert MRSA Assay (FDA approved for NASAL specimens only), is one component of a comprehensive MRSA colonization surveillance program. It is not intended to diagnose MRSA infection nor to guide or monitor treatment for MRSA infections. Test performance is not FDA approved in patients less than 8 years old. Performed at [facility], [place] [address] [place], [place] [address]   Aerobic Culture w Gram Stain (superficial specimen)     Status: None (Preliminary result)   Collection Time: 10/12/21 10:19 PM   Specimen: Leg  Result Value Ref Range Status   Specimen Description   Final    LEG Performed at [facility] [address] [place], [place] [address]    Special Requests   Final    NONE Performed at [facility], [place] [address], [place], [place] [address]    Gram Stain   Final    RARE SQUAMOUS EPITHELIAL CELLS PRESENT NO WBC SEEN RARE YEAST Performed at [facility], [place] [address] [place], [place] [address]    Culture FEW ENTEROCOCCUS FAECALIS FEW YEAST   Final   Report Status PENDING  Incomplete   Organism ID, Bacteria ENTEROCOCCUS FAECALIS  Final      Susceptibility   Enterococcus faecalis - MIC*    AMPICILLIN <=2 SENSITIVE Sensitive     VANCOMYCIN 1 SENSITIVE Sensitive     GENTAMICIN SYNERGY SENSITIVE Sensitive     * FEW ENTEROCOCCUS FAECALIS      Radiology Studies: No results found.   Marzetta Board, MD, PhD [facility]  Between 7 am - 7 pm I am available, please contact me via Amion (for emergencies) or Securechat (non urgent messages)  Between 7 pm - 7 am I am not available, please contact night coverage MD/APP via Amion

## 2021-10-17 DIAGNOSIS — R531 Weakness: Secondary | ICD-10-CM | POA: Diagnosis not present

## 2021-10-17 LAB — BASIC METABOLIC PANEL
Anion gap: 12 (ref 5–15)
BUN: <5 mg/dL — ABNORMAL LOW (ref 6–20)
CO2: 33 mmol/L — ABNORMAL HIGH (ref 22–32)
Calcium: 8.4 mg/dL — ABNORMAL LOW (ref 8.9–10.3)
Chloride: 94 mmol/L — ABNORMAL LOW (ref 98–111)
Creatinine, Ser: 0.58 mg/dL (ref 0.44–1.00)
GFR, Estimated: >60 mL/min (ref >60)
Glucose, Bld: 108 mg/dL — ABNORMAL HIGH (ref 70–99)
Potassium: 2.8 mmol/L — ABNORMAL LOW (ref 3.5–5.1)
Sodium: 139 mmol/L (ref 135–145)

## 2021-10-17 LAB — AEROBIC CULTURE W GRAM STAIN (SUPERFICIAL SPECIMEN)

## 2021-10-17 LAB — POTASSIUM: Potassium: 4.1 mmol/L (ref 3.5–5.1)

## 2021-10-17 LAB — MAGNESIUM
Magnesium: 2 mg/dL (ref 1.7–2.4)
Magnesium: 1.9 mg/dL (ref 1.7–2.4)

## 2021-10-17 MED ORDER — AMOXICILLIN-POT CLAVULANATE 875-125 MG PO TABS
1.0000 | ORAL_TABLET | Freq: Two times a day (BID) | ORAL | Status: DC
  Administered 2021-10-17 – 2021-10-19 (×5): 1 via ORAL
  Filled 2021-10-17 (×5): qty 1

## 2021-10-17 MED ORDER — POTASSIUM CHLORIDE 20 MEQ PO PACK
40.0000 meq | PACK | ORAL | Status: AC
  Administered 2021-10-17 (×3): 40 meq via ORAL
  Filled 2021-10-17 (×3): qty 2

## 2021-10-17 NOTE — Progress Notes (Signed)
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  She was seen in the ED about 5 days ago, started on Keflex and Lasix, but did not improve, purulent drainage and erythema remained and came back to the hospital  Subjective / 24h Interval events: Still having intermittent nausea but not as bad as yesterday.  Complains of her unna boots being too tight  Assesement and Plan: Principal Problem:   Generalized weakness Active Problems:   Anasarca   Cellulitis of right leg   Mass of temporal lobe   Cancer of parotid gland (HCC)   History of pulmonary embolus (PE)   Pressure injury of skin   Acute on chronic diastolic CHF (congestive heart failure) (HCC)  Principal problem Lower extremity swelling, multifactorial in the setting of venous insufficiency, lymphedema, acute on chronic diastolic CHF-patient with persistent significant swelling, wound care saw patient, now she has Unna boot in place.  Overall improving, she appears to be down 7 pounds and legs look significantly better.  Husband tells me that he thinks she is down about 40 pounds from where they last knew her weight -She was evaluated by vascular surgery as an outpatient, recommending leg elevation, compression stockings but she has not been using those. -Due to weakness from her leg swelling SNF was recommended, pending insurance authorization  Active problems Hypokalemia-potassium down again to 2.8 this morning, hold morning Lasix dose, replete aggressively and resume Lasix this  afternoon.  Bicarb increasing, chloride is low, I do not think she needs more than another 1-2 doses of diuresis  Acute on chronic diastolic CHF-most recent 2D echo done 7 days ago showed normal LVEF, grade 1 diastolic dysfunction, normal RV.  IV Lasix, monitor ins and outs, daily weights.  Weight improving from 236 on admission to 230.  Bilateral lower extremity DVT, history of PE-continue Eliquis  Right lower extremity cellulitis-she has small ulcer on the lateral side of her thigh, surrounding erythema.  Wound cultures grew initially Enterococcus, pansensitive, was placed on amoxicillin.  Cultures were updated this morning to reflect for Candida as well as few stenotrophomonas.  Case was discussed with Dr. Drucilla Schmidt with infectious disease, he feels like superficial wound cultures are notoriously unreliable.  Given clinical improvement, recommends Augmentin for 5 to 7 days  Depression-continue home paroxetine  Obesity, class II-based on BMI of 39, she would benefit from weight loss.   Generalized weakness-due to worsening anasarca, right lower extremity cellulitis.  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, status post XRT  Scheduled Meds:  amoxicillin-clavulanate  1 tablet Oral Q12H   apixaban  5 mg Oral BID   divalproex  250 mg Oral BID   divalproex  500 mg Oral BID   furosemide  40 mg Intravenous BID   levETIRAcetam  500 mg Oral BID   nicotine  21 mg Transdermal Daily   pantoprazole  80 mg Oral Q1200   PARoxetine  40 mg Oral Daily   potassium chloride  40 mEq Oral Q3H  Continuous Infusions:  sodium chloride 10 mL/hr at 10/15/21 0556   PRN Meds:.sodium chloride, acetaminophen **OR** acetaminophen, diphenhydrAMINE, EPINEPHrine, lip balm, ondansetron **OR** ondansetron (ZOFRAN) IV, traZODone  Diet Orders (From admission, onward)     Start     Ordered    10/12/21 1945  Diet Heart Room service appropriate? Yes; Fluid consistency: Thin  Diet effective now       Question Answer Comment  Room service appropriate? Yes   Fluid consistency: Thin      10/12/21 1945            DVT prophylaxis: Place TED hose Start: 10/13/21 0824 apixaban (ELIQUIS) tablet 5 mg   Lab Results  Component Value Date   PLT 232 10/16/2021      Code Status: Full Code  Family Communication: Husband present at bedside  Status is: Inpatient  Level of care: Telemetry  Consultants:  none  Procedures:  none  Objective: Vitals:   10/16/21 2213 10/17/21 0315 10/17/21 0519 10/17/21 1004  BP: 128/83  139/74 115/67  Pulse: 98  86 96  Resp: '18  18 14  '$ Temp: 97.8 F (36.6 C)  98 F (36.7 C) 97.8 F (36.6 C)  TempSrc: Oral  Oral Oral  SpO2: 98%  99% 98%  Weight:  104.7 kg    Height:        Intake/Output Summary (Last 24 hours) at 10/17/2021 1046 Last data filed at 10/17/2021 0700 Gross per 24 hour  Intake 280 ml  Output 650 ml  Net -370 ml    Wt Readings from Last 3 Encounters:  10/17/21 104.7 kg  10/07/21 124.7 kg  09/30/21 124.7 kg    Examination:  Constitutional: NAD Eyes: lids and conjunctivae normal, no scleral icterus ENMT: mmm Neck: normal, supple Respiratory: clear to auscultation bilaterally, no wheezing, no crackles. Normal respiratory effort.  Cardiovascular: Regular rate and rhythm, no murmurs / rubs / gallops.  Unna boots in place Abdomen: soft, no distention, no tenderness. Bowel sounds positive.  Skin: no rashes Neurologic: no focal deficits, equal strength   Data Reviewed: I have independently reviewed following labs and imaging studies   CBC Recent Labs  Lab 10/12/21 1535 10/13/21 0446 10/14/21 0531 10/15/21 0451 10/16/21 0440  WBC 5.2 4.7 4.5 4.5 3.8*  HGB 13.1 11.5* 11.5* 11.5* 13.2  HCT 41.9 37.1 36.6 35.9* 41.0  PLT 279 200 242 230 232  MCV 100.5* 103.9* 99.5 98.1 99.8  MCH 31.4 32.2 31.3 31.4 32.1   MCHC 31.3 31.0 31.4 32.0 32.2  RDW 15.2 15.5 15.3 15.5 15.8*     Recent Labs  Lab 10/12/21 1805 10/13/21 0446 10/14/21 0531 10/14/21 1540 10/15/21 0451 10/16/21 0440 10/17/21 0403  NA  --  140 142  --  139 137 139  K  --  3.3* 2.3* 3.1* 3.2* 3.1* 2.8*  CL  --  99 98  --  96* 94* 94*  CO2  --  32 33*  --  33* 30 33*  GLUCOSE  --  102* 106*  --  90 93 108*  BUN  --  <5* <5*  --  <5* <5* <5*  CREATININE  --  0.51 0.51  --  0.55 0.55 0.58  CALCIUM  --  8.1* 8.3*  --  8.3* 8.2* 8.4*  AST 36  --   --   --   --   --   --   ALT 63*  --   --   --   --   --   --  ALKPHOS 76  --   --   --   --   --   --   BILITOT 0.5  --   --   --   --   --   --   ALBUMIN 2.7*  --   --   --   --   --   --   MG  --   --  1.8  --   --  1.8 2.0     ------------------------------------------------------------------------------------------------------------------ No results for input(s): CHOL, HDL, LDLCALC, TRIG, CHOLHDL, LDLDIRECT in the last 72 hours.  Lab Results  Component Value Date   HGBA1C 5.8 (H) 11/17/2020   ------------------------------------------------------------------------------------------------------------------ No results for input(s): TSH, T4TOTAL, T3FREE, THYROIDAB in the last 72 hours.  Invalid input(s): FREET3  Cardiac Enzymes No results for input(s): CKMB, TROPONINI, MYOGLOBIN in the last 168 hours.  Invalid input(s): CK ------------------------------------------------------------------------------------------------------------------    Component Value Date/Time   BNP 36.3 09/12/2021 2151    CBG: Recent Labs  Lab 10/12/21 1434  GLUCAP 145*     Recent Results (from the past 240 hour(s))  Blood Cultures x 2 sites     Status: None   Collection Time: 10/07/21  3:46 PM   Specimen: BLOOD  Result Value Ref Range Status   Specimen Description   Final    BLOOD LEFT ANTECUBITAL Performed at [facility] [address], [place], [place]  [address]    Special Requests   Final    BOTTLES DRAWN AEROBIC AND ANAEROBIC Blood Culture adequate volume Performed at [facility] [address], [place], [place] [address]    Culture   Final    NO GROWTH 5 DAYS Performed at [facility], [place] [address] [place], [place] [address]    Report Status 10/12/2021 FINAL  Final  Urine Culture     Status: Abnormal   Collection Time: 10/12/21  3:11 PM   Specimen: Urine, Clean Catch  Result Value Ref Range Status   Specimen Description   Final    URINE, CLEAN CATCH Performed at [facility], [place] [address], [place], [place] [address]    Special Requests   Final    NONE Performed at [facility], [place] [address], [place], [place] [address]    Culture (A)  Final    >=100,000 COLONIES/mL ENTEROBACTER AEROGENES >=100,000 COLONIES/mL KLEBSIELLA PNEUMONIAE    Report Status 10/14/2021 FINAL  Final   Organism ID, Bacteria ENTEROBACTER AEROGENES (A)  Final   Organism ID, Bacteria KLEBSIELLA PNEUMONIAE (A)  Final      Susceptibility   Enterobacter aerogenes - MIC*    CEFAZOLIN >=64 RESISTANT Resistant     CEFEPIME <=0.12 SENSITIVE Sensitive     CEFTRIAXONE <=0.25 SENSITIVE Sensitive     CIPROFLOXACIN <=0.25 SENSITIVE Sensitive     GENTAMICIN <=1 SENSITIVE Sensitive     IMIPENEM 2 SENSITIVE Sensitive     NITROFURANTOIN 128 RESISTANT Resistant     TRIMETH/SULFA <=20 SENSITIVE Sensitive     PIP/TAZO <=4 SENSITIVE Sensitive     * >=100,000 COLONIES/mL ENTEROBACTER AEROGENES   Klebsiella pneumoniae - MIC*    AMPICILLIN >=32 RESISTANT Resistant     CEFAZOLIN <=4 SENSITIVE Sensitive     CEFEPIME <=0.12 SENSITIVE Sensitive     CEFTRIAXONE <=0.25 SENSITIVE Sensitive     CIPROFLOXACIN <=0.25 SENSITIVE Sensitive     GENTAMICIN <=1 SENSITIVE Sensitive     IMIPENEM <=0.25 SENSITIVE Sensitive     NITROFURANTOIN 64  INTERMEDIATE Intermediate     TRIMETH/SULFA <=20 SENSITIVE Sensitive      AMPICILLIN/SULBACTAM 4 SENSITIVE Sensitive     PIP/TAZO <=4 SENSITIVE Sensitive     * >=100,000 COLONIES/mL KLEBSIELLA PNEUMONIAE  MRSA Next Gen by PCR, Nasal     Status: None   Collection Time: 10/12/21 10:19 PM   Specimen: Leg; Nasal Swab  Result Value Ref Range Status   MRSA by PCR Next Gen NOT DETECTED NOT DETECTED Final    Comment: (NOTE) The GeneXpert MRSA Assay (FDA approved for NASAL specimens only), is one component of a comprehensive MRSA colonization surveillance program. It is not intended to diagnose MRSA infection nor to guide or monitor treatment for MRSA infections. Test performance is not FDA approved in patients less than 31 years old. Performed at [facility], [place] [address], [place], [place] [address]   Aerobic Culture w Gram Stain (superficial specimen)     Status: None   Collection Time: 10/12/21 10:19 PM   Specimen: Leg  Result Value Ref Range Status   Specimen Description   Final    LEG Performed at [facility] [address], [place], [place] [address]    Special Requests   Final    NONE Performed at [facility], [place] [address] [place], [place] [address]    Gram Stain   Final    RARE SQUAMOUS EPITHELIAL CELLS PRESENT NO WBC SEEN RARE YEAST Performed at [facility], [place] [address], [place], [place] [address]    Culture   Final    FEW ENTEROCOCCUS FAECALIS FEW CANDIDA PARAPSILOSIS FEW STENOTROPHOMONAS MALTOPHILIA    Report Status 10/17/2021 FINAL  Final   Organism ID, Bacteria ENTEROCOCCUS FAECALIS  Final   Organism ID, Bacteria STENOTROPHOMONAS MALTOPHILIA  Final      Susceptibility   Enterococcus faecalis - MIC*    AMPICILLIN <=2 SENSITIVE Sensitive     VANCOMYCIN 1 SENSITIVE Sensitive     GENTAMICIN SYNERGY SENSITIVE Sensitive     * FEW ENTEROCOCCUS FAECALIS   Stenotrophomonas maltophilia - MIC*    LEVOFLOXACIN 0.25 SENSITIVE Sensitive     TRIMETH/SULFA <=20 SENSITIVE  Sensitive     * FEW STENOTROPHOMONAS MALTOPHILIA      Radiology Studies: No results found.   Marzetta Board, MD, PhD [facility]  Between 7 am - 7 pm I am available, please contact me via Amion (for emergencies) or Securechat (non urgent messages)  Between 7 pm - 7 am I am not available, please contact night coverage MD/APP via Amion

## 2021-10-17 NOTE — Progress Notes (Signed)
Orthopedic Tech Progress Note Patient Details:  Melissa Case May 19, 1961 [id number]  Ortho Devices Type of Ortho Device: Ace wrap, Unna boot Ortho Device/Splint Location: Bilateral unna boots Ortho Device/Splint Interventions: Application   Post Interventions Patient Tolerated: Well Instructions Provided: Care of device, Adjustment of device  [name] 10/17/2021, 8:48 PM

## 2021-10-17 NOTE — Progress Notes (Signed)
Physical Therapy Treatment Patient Details Name: Melissa Case MRN: [id number] DOB: 01/30/1961 Today's Date: 10/17/2021   History of Present Illness Patient is 61 y.o. female presenting with worsening venous stasis ulcers as well as increased redness in the lower extremities.  She was seen in the ED ~5 days PTA, started on Keflex and Lasix, pt has improved swelling, but purulent drainage and erythema continue despite keflex so pt came back to the hospital. 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    PT Comments    Pt demonstrates incr activity tol/incr gait distance and decr assist needed with STS transfers this session. Pt continues to fatigue easily and continues to be at risk for falls. Will benefit from SNF post acute to maximize pt independence and safety.    Recommendations for follow up therapy are one component of a multi-disciplinary discharge planning process, led by the attending physician.  Recommendations may be updated based on patient status, additional functional criteria and insurance authorization.  Follow Up Recommendations  Skilled nursing-short term rehab (<3 hours/day)     Assistance Recommended at Discharge Frequent or constant Supervision/Assistance  Patient can return home with the following A little help with walking and/or transfers;A little help with bathing/dressing/bathroom;Assistance with cooking/housework;Assist for transportation;Help with stairs or ramp for entrance   Equipment Recommendations  Other (comment) (TBD)    Recommendations for Other Services       Precautions / Restrictions Precautions Precautions: Fall Restrictions Weight Bearing Restrictions: No     Mobility  Bed Mobility               General bed mobility comments: pt in recliner on arrival     Transfers Overall transfer level: Needs assistance Equipment used: Rolling walker (2 wheels) Transfers: Sit to/from Stand Sit to Stand: Min assist           General transfer comment: assist with anterior-superior wt shift, to steady on transition to RW, verbal and tactile cues for correct hand placement and safety    Ambulation/Gait Ambulation/Gait assistance: Min assist Gait Distance (Feet): 12 Feet (15' more) Assistive device: Rolling walker (2 wheels) Gait Pattern/deviations: Step-through pattern, Decreased step length - right, Decreased step length - left, Decreased stride length, Wide base of support, Trunk flexed       General Gait Details: pt walks with flexed posture and head down, Min assist to steady with Mod verbal cues to manage walker and deter pt from advancing walker too far ahead. Pt fatigued quickly and seated rest required. HR into 125 max with activity, SpO2 was 92-95%. on RA   Stairs             Wheelchair Mobility    Modified Rankin (Stroke Patients Only)       Balance   Sitting-balance support: Single extremity supported, Feet supported Sitting balance-Leahy Scale: Good       Standing balance-Leahy Scale: Poor Standing balance comment: reliant on RW                            Cognition Arousal/Alertness: Awake/alert Behavior During Therapy: Flat affect Overall Cognitive Status: No family/caregiver present to determine baseline cognitive functioning                     Current Attention Level: Sustained   Following Commands:  Follows one step commands consistently, Follows multi-step commands inconsistently     Problem Solving: Slow processing, Requires verbal cues, Difficulty sequencing          Exercises      General Comments        Pertinent Vitals/Pain Pain Assessment Pain Assessment: No/denies pain    Home Living                          Prior Function            PT Goals  (current goals can now be found in the care plan section) Acute Rehab PT Goals Patient Stated Goal: get home to her dog PT Goal Formulation: With patient/family Time For Goal Achievement: 10/28/21 Potential to Achieve Goals: Good Progress towards PT goals: Progressing toward goals    Frequency    Min 2X/week      PT Plan Current plan remains appropriate    Co-evaluation              AM-PAC PT "6 Clicks" Mobility   Outcome Measure  Help needed turning from your back to your side while in a flat bed without using bedrails?: A Little Help needed moving from lying on your back to sitting on the side of a flat bed without using bedrails?: A Little Help needed moving to and from a bed to a chair (including a wheelchair)?: A Little Help needed standing up from a chair using your arms (e.g., wheelchair or bedside chair)?: A Little Help needed to walk in hospital room?: A Little Help needed climbing 3-5 steps with a railing? : A Lot 6 Click Score: 17    End of Session Equipment Utilized During Treatment: Gait belt Activity Tolerance: Patient tolerated treatment well Patient left: with call bell/phone within reach;in chair;with chair alarm set   PT Visit Diagnosis: Unsteadiness on feet (R26.81);Muscle weakness (generalized) (M62.81);Difficulty in walking, not elsewhere classified (R26.2)     Time: 9379-0240 PT Time Calculation (min) (ACUTE ONLY): 13 min  Charges:  $Gait Training: 8-22 mins                     Baxter Flattery, PT  Acute Rehab Dept ([place]) [phone] Pager [phone]  10/17/2021    [facility] 10/17/2021, 2:42 PM

## 2021-10-17 NOTE — TOC Progression Note (Addendum)
Transition of Care [facility]) - Progression Note    Patient Details  Name: [name] MRN: [id number] Date of Birth: Oct 09, 1960  Transition of Care [facility]) CM/SW Contact  Adriena Manfre, Juliann Pulse, RN Phone Number: 10/17/2021, 9:34 AM  Clinical Narrative: Patient chose [facility] will confirm if able to accept once auth-left vm & text message with rep [email or web address] PT to see prior initiating auth.  -2:52p-Left vm with [facility] [address] rep Teena rep to initiate auth on Non Navi-PT note placed. Await auth.     Expected Discharge Plan: [place] Barriers to Discharge: Continued Medical Work up  Expected Discharge Plan and Services Expected Discharge Plan: [place]   Discharge Planning Services: CM Consult Post Acute Care Choice: [place] Living arrangements for the past 2 months: Single Family Home                                       Social Determinants of Health (SDOH) Interventions    Readmission Risk Interventions    10/14/2021    2:14 PM  Readmission Risk Prevention Plan  Transportation Screening Complete  Medication Review (RN Care Manager) Complete  PCP or Specialist appointment within 3-5 days of discharge Complete  HRI or Home Care Consult Complete  SW Recovery Care/Counseling Consult Complete  Palliative Care Screening Not [place] Complete

## 2021-10-17 NOTE — Progress Notes (Signed)
Orthopedic Tech Progress Note Patient Details:  Melissa Case [birth year] [id number]  Patient ID: [name], female   DOB: 1960-06-30, 61 y.o.   MRN: [id number]  [name] 10/17/2021, 7:02 [employer] Nurse to inquire about unna boot change.

## 2021-10-18 DIAGNOSIS — I5033 Acute on chronic diastolic (congestive) heart failure: Secondary | ICD-10-CM

## 2021-10-18 DIAGNOSIS — R601 Generalized edema: Secondary | ICD-10-CM | POA: Diagnosis not present

## 2021-10-18 DIAGNOSIS — C07 Malignant neoplasm of parotid gland: Secondary | ICD-10-CM | POA: Diagnosis not present

## 2021-10-18 DIAGNOSIS — R531 Weakness: Secondary | ICD-10-CM | POA: Diagnosis not present

## 2021-10-18 DIAGNOSIS — L03115 Cellulitis of right lower limb: Secondary | ICD-10-CM | POA: Diagnosis not present

## 2021-10-18 DIAGNOSIS — Z86718 Personal history of other venous thrombosis and embolism: Secondary | ICD-10-CM | POA: Diagnosis not present

## 2021-10-18 DIAGNOSIS — I872 Venous insufficiency (chronic) (peripheral): Secondary | ICD-10-CM

## 2021-10-18 LAB — MAGNESIUM: Magnesium: 2.1 mg/dL (ref 1.7–2.4)

## 2021-10-18 LAB — BASIC METABOLIC PANEL
Anion gap: 10 (ref 5–15)
BUN: <5 mg/dL — ABNORMAL LOW (ref 6–20)
CO2: 34 mmol/L — ABNORMAL HIGH (ref 22–32)
Calcium: 8.4 mg/dL — ABNORMAL LOW (ref 8.9–10.3)
Chloride: 93 mmol/L — ABNORMAL LOW (ref 98–111)
Creatinine, Ser: 0.62 mg/dL (ref 0.44–1.00)
GFR, Estimated: >60 mL/min (ref >60)
Glucose, Bld: 106 mg/dL — ABNORMAL HIGH (ref 70–99)
Potassium: 3.1 mmol/L — ABNORMAL LOW (ref 3.5–5.1)
Sodium: 137 mmol/L (ref 135–145)

## 2021-10-18 MED ORDER — POTASSIUM CHLORIDE CRYS ER 20 MEQ PO TBCR
40.0000 meq | EXTENDED_RELEASE_TABLET | ORAL | Status: AC
  Administered 2021-10-18 (×2): 40 meq via ORAL
  Filled 2021-10-18 (×2): qty 2

## 2021-10-18 MED ORDER — POTASSIUM CHLORIDE 20 MEQ PO PACK
40.0000 meq | PACK | ORAL | Status: DC
  Administered 2021-10-18: 40 meq via ORAL
  Filled 2021-10-18: qty 2

## 2021-10-18 NOTE — Consult Note (Signed)
CONSULTATION NOTE   Patient Name: Melissa Case Date of Encounter: 10/18/2021 Cardiologist: None Electrophysiologist: None Advanced Heart Failure: None   Chief Complaint   Volume overload  Patient Profile   61 yo female with generalized weakness, cellulitis and history of metastatic prostate cancer, found to have anasarca  HPI   Melissa Case is a 61 y.o. female who is being seen today for the evaluation of anasarca at the request of Dr. Starla Link. 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  Cardiology was asked to see for possible acute on chronic diastolic heart failure.  Go in December 2022 showed LVEF 55 to 60% with mild LVH and no significant valve disease.  Repeat echo on Oct 07, 2021, demonstrated LVEF 60 to 65%, mild LVH and grade 1 diastolic dysfunction.  Review of prior lab work included BNP's which have been drawn numerous times over the past year and all of which resulted negative.  PMHx   Past Medical History:  Diagnosis Date   Cancer ([place])    Partoid   Depression    DVT (deep venous thrombosis) (HCC)    GERD (gastroesophageal reflux disease)    History of radiation to head and neck region 09/16/2019   History IMRT (09/16/19 - 10/27/19) for parotid tumor stage pT3, pN2b.   Panic attack    Seizures ([place])     Past Surgical History:  Procedure Laterality Date   ABDOMINAL HYSTERECTOMY     CHOLECYSTECTOMY N/A 11/21/2017   Procedure: LAPAROSCOPIC CHOLECYSTECTOMY WITH INTRAOPERATIVE CHOLANGIOGRAM;  Surgeon: Jovita Kussmaul, MD;  Location: [facility];  Service: General;  Laterality: N/A;   MULTIPLE EXTRACTIONS WITH ALVEOLOPLASTY N/A 08/28/2019   Procedure: Extraction of tooth #'s 3-7, 10-15, and 20-31 with alveoloplatsy, maxillary right buccal exostosis reduction, and  bilateral mandibular tori reductions.;  Surgeon: Lenn Cal, DDS;  Location: [place];  Service: Oral Surgery;  Laterality: N/A;   PAROTIDECTOMY Right 07/22/2019   with biopsy; done at [facility] by Dr. Fredricka Bonine    FAMHx   Family History  Problem Relation Age of Onset   Depression Sister    Heart disease Mother    Diabetes Father    Breast cancer Paternal Grandmother    Colon cancer Neg Hx    Pancreatic cancer Neg Hx    Esophageal cancer Neg Hx    Stomach cancer Neg Hx    Rectal cancer Neg Hx    Liver cancer Neg Hx     SOCHx    reports that she quit smoking about 9 months ago. Her smoking use included cigarettes. She started smoking about 45 years ago. She has a 21.00 pack-year smoking history. She has been exposed to tobacco smoke. She has never used smokeless tobacco. She reports that she does not drink alcohol and does not use drugs.  Outpatient Medications   No current facility-administered medications on file prior to encounter.   Current Outpatient Medications on File Prior to Encounter  Medication Sig Dispense Refill   apixaban (ELIQUIS) 5 MG TABS tablet Take 1 tablet (5 mg total) by mouth 2 (two) times daily. 60 tablet 3   divalproex (DEPAKOTE) 250 MG DR tablet Take 1 tablet (250 mg total) by mouth 2 (two) times daily. (In addition to '500mg'$  twice daily for a total dosage of '750mg'$  BID). 60 tablet 6  divalproex (DEPAKOTE) 500 MG DR tablet Take 1 tablet (500 mg total) by mouth 2 (two) times daily. 60 tablet 6   esomeprazole (NEXIUM) 40 MG capsule Take 1 capsule (40 mg total) by mouth 2 (two) times daily before a meal. 180 capsule 3   furosemide (LASIX) 40 MG tablet Take 1 tablet (40 mg total) by mouth 2 (two) times daily. 60 tablet 0   levETIRAcetam (KEPPRA) 500 MG tablet Take 1 tablet (500 mg total) by mouth 2 (two) times daily. 60 tablet 6   PARoxetine (PAXIL) 40 MG tablet Take 1 tablet (40 mg total) by mouth daily. 90 tablet 0   potassium chloride SA  (KLOR-CON M) 20 MEQ tablet Take 1 tablet (20 mEq total) by mouth 2 (two) times daily. 60 tablet 0   traZODone (DESYREL) 50 MG tablet Take 50 mg by mouth at bedtime as needed for sleep.      Inpatient Medications    Scheduled Meds:  amoxicillin-clavulanate  1 tablet Oral Q12H   apixaban  5 mg Oral BID   divalproex  250 mg Oral BID   divalproex  500 mg Oral BID   furosemide  40 mg Intravenous BID   levETIRAcetam  500 mg Oral BID   nicotine  21 mg Transdermal Daily   pantoprazole  80 mg Oral Q1200   PARoxetine  40 mg Oral Daily   potassium chloride  40 mEq Oral Q4H    Continuous Infusions:  sodium chloride 10 mL/hr at 10/15/21 0556    PRN Meds: sodium chloride, acetaminophen **OR** acetaminophen, diphenhydrAMINE, EPINEPHrine, lip balm, ondansetron **OR** ondansetron (ZOFRAN) IV, traZODone   ALLERGIES   Allergies  Allergen Reactions   Morphine Itching    ROS   Pertinent items noted in HPI and remainder of comprehensive ROS otherwise negative.  Vitals   Vitals:   10/17/21 2309 10/18/21 0500 10/18/21 0527 10/18/21 1312  BP: 122/69  127/66 118/77  Pulse: 94  85 (!) 110  Resp: '18  16 18  '$ Temp: 97.7 F (36.5 C)  97.6 F (36.4 C) 97.9 F (36.6 C)  TempSrc: Oral  Oral Oral  SpO2: 100%  98% 91%  Weight:  101.1 kg    Height:        Intake/Output Summary (Last 24 hours) at 10/18/2021 1347 Last data filed at 10/18/2021 1000 Gross per 24 hour  Intake 50 ml  Output --  Net 50 ml   Filed Weights   10/16/21 0500 10/17/21 0315 10/18/21 0500  Weight: 104.1 kg 104.7 kg 101.1 kg    Physical Exam   General appearance: no distress and moderately obese Neck: no carotid bruit, no JVD, thyroid not enlarged, symmetric, no tenderness/mass/nodules, and right facial droop and significant right parotid area dissection with scarring Lungs: diminished breath sounds bibasilar Heart: regular rate and rhythm Abdomen: soft, non-tender; bowel sounds normal; no masses,  no organomegaly  and obese Extremities: edema bilateral with Ace bandages in place Pulses: 2+ and symmetric Skin: Skin color, texture, turgor normal. No rashes or lesions Neurologic: Grossly normal Psych: Pleasant  Labs   Results for orders placed or performed during the hospital encounter of 10/12/21 (from the past 48 hour(s))  Basic metabolic panel     Status: Abnormal   Collection Time: 10/17/21  4:03 AM  Result Value Ref Range   Sodium 139 135 - 145 mmol/L   Potassium 2.8 (L) 3.5 - 5.1 mmol/L   Chloride 94 (L) 98 - 111 mmol/L   CO2 33 (H) 22 -  32 mmol/L   Glucose, Bld 108 (H) 70 - 99 mg/dL    Comment: Glucose reference range applies only to samples taken after fasting for at least 8 hours.   BUN <5 (L) 6 - 20 mg/dL   Creatinine, Ser 0.58 0.44 - 1.00 mg/dL   Calcium 8.4 (L) 8.9 - 10.3 mg/dL   GFR, Estimated >60 >60 mL/min    Comment: (NOTE) Calculated using the CKD-EPI Creatinine Equation (2021)    Anion gap 12 5 - 15    Comment: Performed at [facility], [place] [address], [place], [place] [address]  Magnesium     Status: None   Collection Time: 10/17/21  4:03 AM  Result Value Ref Range   Magnesium 2.0 1.7 - 2.4 mg/dL    Comment: Performed at [facility], [place] [address], [place], [place] [address]  Potassium     Status: None   Collection Time: 10/17/21  3:58 PM  Result Value Ref Range   Potassium 4.1 3.5 - 5.1 mmol/L    Comment: DELTA CHECK NOTED MODERATE HEMOLYSIS Performed at [place] [address], [place], [place] [address]   Magnesium     Status: None   Collection Time: 10/17/21 10:36 PM  Result Value Ref Range   Magnesium 1.9 1.7 - 2.4 mg/dL    Comment: Performed at [facility], [place] [address] [place], [place] [address]  Basic metabolic panel     Status: Abnormal   Collection Time: 10/18/21  4:56 AM  Result Value Ref Range   Sodium 137 135 - 145 mmol/L   Potassium 3.1 (L) 3.5 - 5.1 mmol/L     Comment: DELTA CHECK NOTED   Chloride 93 (L) 98 - 111 mmol/L   CO2 34 (H) 22 - 32 mmol/L   Glucose, Bld 106 (H) 70 - 99 mg/dL    Comment: Glucose reference range applies only to samples taken after fasting for at least 8 hours.   BUN <5 (L) 6 - 20 mg/dL   Creatinine, Ser 0.62 0.44 - 1.00 mg/dL   Calcium 8.4 (L) 8.9 - 10.3 mg/dL   GFR, Estimated >60 >60 mL/min    Comment: (NOTE) Calculated using the CKD-EPI Creatinine Equation (2021)    Anion gap 10 5 - 15    Comment: Performed at [facility], [place] [address], [place], [place] [address]  Magnesium     Status: None   Collection Time: 10/18/21  4:56 AM  Result Value Ref Range   Magnesium 2.1 1.7 - 2.4 mg/dL    Comment: Performed at [facility], [place] [address], [place], [place] [address]    ECG   Sinus tachycardia at 102- Personally Reviewed  Telemetry   Sinus tachycardia- Personally Reviewed  Radiology   No results found.  Cardiac Studies   N/A  Impression   Principal Problem:   Generalized weakness Active Problems:   Cancer of parotid gland (HCC)   Mass of temporal lobe   Anasarca   History of pulmonary embolus (PE)   Cellulitis of right leg   Pressure injury of skin   Acute on chronic diastolic CHF (congestive heart failure) ([place])   Recommendation   Ms. Stahlman has volume overload and lower extremity edema likely secondary to chronic venous insufficiency.  She has a history of DVTs and PE in the past and has had recurrent cellulitis.  Recurrent work-up for heart failure has been generally negative including low BNP's  and echo shows only some mild diastolic dysfunction.  I do not think that this is acute diastolic heart failure.  She will likely need to remain on a diuretic for her edema.  She denies any sleep apnea symptoms but has never been tested.  I would consider this, although RV systolic function was considered normal.  No cardiac follow-up is necessary at this  time.  Thanks for the consultation.  We will sign off.  Call with questions.  [facility] will sign off.   Medication Recommendations:  as above Other recommendations (labs, testing, etc):  none Follow up as an outpatient: none   Time Spent Directly with Patient:  I have spent a total of 45 minutes with the patient reviewing hospital notes, telemetry, EKGs, labs and examining the patient as well as establishing an assessment and plan that was discussed personally with the patient.  > 50% of time was spent in direct patient care.  Length of Stay:  LOS: 5 days   Pixie Casino, MD, [facility], [place] Director of the Advanced Lipid Disorders &  Cardiovascular Risk Reduction Clinic Diplomate of the American Board of Clinical Lipidology Attending Cardiologist  Direct Dial: [phone]  Fax: [phone]  Website:  [email or web address] 10/18/2021, 1:47 PM

## 2021-10-18 NOTE — Plan of Care (Signed)

## 2021-10-18 NOTE — Progress Notes (Signed)
Physical Therapy Treatment Patient Details Name: Melissa Case MRN: [id number] DOB: 25-Aug-1960 Today's Date: 10/18/2021   History of Present Illness Patient is 61 y.o. female presenting with worsening venous stasis ulcers as well as increased redness in the lower extremities.  She was seen in the ED ~5 days PTA, started on Keflex and Lasix, pt has improved swelling, but purulent drainage and erythema continue despite keflex so pt came back to the hospital. 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    PT Comments    Pt seen today with focus  on activity tolerance, amb, transfers and overall reinforcement of safety with mobility. Pt reports she has all her DME at home, her husband works second shift and is able to help during the day, she mostly sleeps while he is at work.  Pt SpO2=90-95% during session with HR max 133 while mobilizing.  Pt will benefit from HHPT as she has no in state SNF benefits. Will continue to follow in acute care.    Recommendations for follow up therapy are one component of a multi-disciplinary discharge planning process, led by the attending physician.  Recommendations may be updated based on patient status, additional functional criteria and insurance authorization.  Follow Up Recommendations  Home health PT (has no SNF benefits in [place])     Assistance Recommended at Discharge Frequent or constant Supervision/Assistance  Patient can return home with the following A little help with walking and/or transfers;A little help with bathing/dressing/bathroom;Assistance with cooking/housework;Assist for transportation;Help with stairs or ramp for entrance   Equipment Recommendations  Other (comment) (TBD)    Recommendations for Other Services       Precautions / Restrictions  Precautions Precautions: Fall Restrictions Weight Bearing Restrictions: No     Mobility  Bed Mobility               General bed mobility comments: pt in recliner on arrival    Transfers Overall transfer level: Needs assistance Equipment used: Rolling walker (2 wheels) Transfers: Sit to/from Stand Sit to Stand: Min guard           General transfer comment: min/guard for safety on transition to RW, verbal and tactile cues for correct hand placement and safety    Ambulation/Gait Ambulation/Gait assistance: Min guard Gait Distance (Feet): 15 Feet (x2) Assistive device: Rolling walker (2 wheels) Gait Pattern/deviations: Step-through pattern, Decreased stride length, Wide base of support, Trunk flexed Gait velocity: decr     General Gait Details: pt walks with flexed posture; Min-guard with verbal cues to manage walker and deter pt from advancing walker too far ahead. Pt fatigued quickly; HR 133 max with activity, SpO2 was 90-95%. on RA   Stairs             Wheelchair Mobility    Modified Rankin (Stroke Patients Only)       Balance   Sitting-balance support: Single extremity supported, Feet supported Sitting balance-Leahy Scale: Good       Standing balance-Leahy Scale: Poor Standing balance comment: reliant on RW                            Cognition Arousal/Alertness: Awake/alert Behavior During Therapy: Flat affect Overall Cognitive Status: No family/caregiver present to determine baseline cognitive functioning  Current Attention Level: Sustained   Following Commands: Follows one step commands consistently, Follows multi-step commands inconsistently Safety/Judgement: Decreased awareness of safety   Problem Solving: Slow processing, Requires verbal cues, Difficulty sequencing          Exercises      General Comments        Pertinent Vitals/Pain Pain Assessment Pain Assessment: No/denies pain     Home Living                          Prior Function            PT Goals (current goals can now be found in the care plan section) Acute Rehab PT Goals Patient Stated Goal: get home to her dog PT Goal Formulation: With patient/family Time For Goal Achievement: 10/28/21 Potential to Achieve Goals: Good Progress towards PT goals: Progressing toward goals    Frequency    Min 3X/week      PT Plan Frequency needs to be updated;Discharge plan needs to be updated    Co-evaluation              AM-PAC PT "6 Clicks" Mobility   Outcome Measure  Help needed turning from your back to your side while in a flat bed without using bedrails?: A Little Help needed moving from lying on your back to sitting on the side of a flat bed without using bedrails?: A Little Help needed moving to and from a bed to a chair (including a wheelchair)?: A Little Help needed standing up from a chair using your arms (e.g., wheelchair or bedside chair)?: A Little Help needed to walk in hospital room?: A Little Help needed climbing 3-5 steps with a railing? : A Lot 6 Click Score: 17    End of Session Equipment Utilized During Treatment: Gait belt Activity Tolerance: Patient tolerated treatment well Patient left: with call bell/phone within reach;in chair;with chair alarm set Nurse Communication: Mobility status PT Visit Diagnosis: Unsteadiness on feet (R26.81);Muscle weakness (generalized) (M62.81);Difficulty in walking, not elsewhere classified (R26.2)     Time: 9163-8466 PT Time Calculation (min) (ACUTE ONLY): 20 min  Charges:  $Gait Training: 8-22 mins                     Baxter Flattery, PT  Acute Rehab Dept ([place]) [phone] Pager [phone]  10/18/2021    [name] 10/18/2021, 1:22 PM

## 2021-10-18 NOTE — Progress Notes (Signed)
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  She was admitted and started on IV antibiotics and Lasix.  Assessment & Plan:   Acute on chronic diastolic CHF Lower extremity swelling/venous insufficiency/lymphedema -Presented with worsening lower extremity swelling -Currently has Unna boots. -Currently improving.  Strict input and output.  Daily weights.  Fluid restriction. -She was evaluated by vascular surgery as an outpatient, recommending leg elevation, compression stockings but she has not been using those -Recent echo had shown EF of 60 to 65% with grade 1 diastolic dysfunction continue IV Lasix.  Switch to oral at the time of discharge.  Outpatient follow-up with cardiology  Acute respiratory failure with hypoxia -Possibly from above.  Still on 3 L oxygen by nasal cannula.  Wean off as able.  Might need home oxygen on discharge  Right lower extremity cellulitis -she has small ulcer on the lateral side of her thigh, surrounding erythema.  Wound cultures grew initially Enterococcus, pansensitive, was placed on amoxicillin.  Mother subsequently culture showed Candida and stenotrophomonas. -Case was discussed with Dr. Drucilla Schmidt with infectious disease, he feels like superficial wound cultures are notoriously unreliable.  Given clinical improvement, recommends Augmentin for 5 to 7 days  Bilateral lower EXTR DVT/history of PE -Continue Eliquis  Hypokalemia -Replace.  Repeat a.m. labs  Depression -Continue home paroxetine  Obesity -Outpatient  follow-up  Generalized weakness Physical deconditioning -PT recommends SNF placement.  TOC following.  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  Need for SNF placement.  Still on IV Lasix    Consultants: None/curbside ID  Procedures: None  Antimicrobials:  Anti-infectives (From admission, onward)    Start     Dose/Rate Route Frequency Ordered Stop   10/17/21 1000  amoxicillin-clavulanate (AUGMENTIN) 875-125 MG per tablet 1 tablet        1 tablet Oral Every 12 hours 10/17/21 0900 10/27/21 0959   10/14/21 2200  amoxicillin (AMOXIL) capsule 500 mg  Status:  Discontinued        500 mg Oral Every 8 hours 10/14/21 1608 10/17/21 0859   10/14/21 1430  amoxicillin (AMOXIL) capsule 500 mg        500 mg Oral  Once 10/14/21 1348 10/14/21 1400   10/13/21 1800  cefTRIAXone (ROCEPHIN) 2 g in sodium chloride 0.9 % 100 mL IVPB        2 g 200 mL/hr over 30 Minutes Intravenous Every 24 hours 10/12/21 2253 10/14/21 1745   10/12/21 1645  cefTRIAXone (ROCEPHIN) 2 g in sodium chloride 0.9 % 100 mL IVPB        2 g 200 mL/hr over 30 Minutes Intravenous  Once 10/12/21 1630 10/12/21 2107        Subjective: Patient seen and examined at bedside.  Feels better.  Wants to go home.  Still short of breath with exertion.  No overnight fever, vomiting, seizures  reported.  Objective: Vitals:   10/17/21 1004 10/17/21 2309 10/18/21 0500 10/18/21 0527  BP: 115/67 122/69  127/66  Pulse: 96 94  85  Resp: '14 18  16  '$ Temp: 97.8 F (36.6 C) 97.7 F (36.5 C)  97.6 F (36.4 C)  TempSrc: Oral Oral  Oral  SpO2: 98% 100%  98%  Weight:   101.1 kg   Height:        Intake/Output Summary  (Last 24 hours) at 10/18/2021 1143 Last data filed at 10/18/2021 1000 Gross per 24 hour  Intake 50 ml  Output --  Net 50 ml   Filed Weights   10/16/21 0500 10/17/21 0315 10/18/21 0500  Weight: 104.1 kg 104.7 kg 101.1 kg    Examination:  General exam: Appears calm and comfortable.  Looks chronically ill and deconditioned.  Currently on 3 L oxygen by nasal cannula. Respiratory system: Bilateral decreased breath sounds at bases with scattered crackles Cardiovascular system: S1 & S2 heard, Rate controlled Gastrointestinal system: Abdomen is nondistended, soft and nontender. Normal bowel sounds heard. Extremities: No cyanosis, clubbing; bilateral lower extremity have Unna boots Central nervous system: Alert and oriented.  Slow to respond.  Poor historian.  No focal neurological deficits. Moving extremities Skin: No obvious ecchymosis/lesions Psychiatry: Affect is flat.  No signs of agitation.    Data Reviewed: I have personally reviewed following labs and imaging studies  CBC: Recent Labs  Lab 10/12/21 1535 10/13/21 0446 10/14/21 0531 10/15/21 0451 10/16/21 0440  WBC 5.2 4.7 4.5 4.5 3.8*  HGB 13.1 11.5* 11.5* 11.5* 13.2  HCT 41.9 37.1 36.6 35.9* 41.0  MCV 100.5* 103.9* 99.5 98.1 99.8  PLT 279 200 242 230 233   Basic Metabolic Panel: Recent Labs  Lab 10/14/21 0531 10/14/21 1540 10/15/21 0451 10/16/21 0440 10/17/21 0403 10/17/21 1558 10/17/21 2236 10/18/21 0456  NA 142  --  139 137 139  --   --  137  K 2.3*   < > 3.2* 3.1* 2.8* 4.1  --  3.1*  CL 98  --  96* 94* 94*  --   --  93*  CO2 33*  --  33* 30 33*  --   --  34*  GLUCOSE 106*  --  90 93 108*  --   --  106*  BUN <5*  --  <5* <5* <5*  --   --  <5*  CREATININE 0.51  --  0.55 0.55 0.58  --   --  0.62  CALCIUM 8.3*  --  8.3* 8.2* 8.4*  --   --  8.4*  MG 1.8  --   --  1.8 2.0  --  1.9 2.1   < > = values in this interval not displayed.   GFR: Estimated Creatinine Clearance: 86.5 mL/min (by C-G formula based on SCr  of 0.62 mg/dL). Liver Function Tests: Recent Labs  Lab 10/12/21 1805  AST 36  ALT 63*  ALKPHOS 76  BILITOT 0.5  PROT 5.9*  ALBUMIN 2.7*   Recent Labs  Lab 10/12/21 1805  LIPASE 25   No results for input(s): AMMONIA in the last 168 hours. Coagulation Profile: No results for input(s): INR, PROTIME in the last 168 hours. Cardiac Enzymes: No results for input(s): CKTOTAL, CKMB, CKMBINDEX, TROPONINI in the last 168 hours. BNP (last 3 results) Recent Labs    08/12/21 1009  PROBNP 28.0   HbA1C: No results for input(s): HGBA1C in the last 72 hours. CBG: Recent Labs  Lab 10/12/21 1434  GLUCAP 145*   Lipid Profile: No results for input(s): CHOL, HDL, LDLCALC, TRIG, CHOLHDL, LDLDIRECT in the last 72 hours. Thyroid Function Tests: No results for input(s): TSH, T4TOTAL, FREET4, T3FREE, THYROIDAB in the last 72 hours. Anemia Panel: No results for input(s): VITAMINB12, FOLATE, FERRITIN, TIBC, IRON, RETICCTPCT in the last 72 hours. Sepsis Labs: No results for input(s): PROCALCITON, LATICACIDVEN in the last 168 hours.  Recent Results (from the past 240 hour(s))  Urine Culture     Status: Abnormal   Collection Time: 10/12/21  3:11 PM   Specimen: Urine, Clean Catch  Result Value Ref Range Status   Specimen Description   Final    URINE, CLEAN CATCH Performed at [facility], [place] [address], [place], [place] [address]    Special Requests   Final    NONE Performed at [facility], [place] [address] [place], [place] [address]    Culture (A)  Final    >=100,000 COLONIES/mL ENTEROBACTER AEROGENES >=100,000 COLONIES/mL KLEBSIELLA PNEUMONIAE    Report Status 10/14/2021 FINAL  Final   Organism ID, Bacteria ENTEROBACTER AEROGENES (A)  Final   Organism ID, Bacteria KLEBSIELLA PNEUMONIAE (A)  Final      Susceptibility   Enterobacter aerogenes - MIC*    CEFAZOLIN >=64 RESISTANT Resistant     CEFEPIME <=0.12 SENSITIVE Sensitive     CEFTRIAXONE  <=0.25 SENSITIVE Sensitive     CIPROFLOXACIN <=0.25 SENSITIVE Sensitive     GENTAMICIN <=1 SENSITIVE Sensitive     IMIPENEM 2 SENSITIVE Sensitive     NITROFURANTOIN 128 RESISTANT Resistant     TRIMETH/SULFA <=20 SENSITIVE Sensitive     PIP/TAZO <=4 SENSITIVE Sensitive     * >=100,000 COLONIES/mL ENTEROBACTER AEROGENES   Klebsiella pneumoniae - MIC*    AMPICILLIN >=32 RESISTANT Resistant     CEFAZOLIN <=4 SENSITIVE Sensitive     CEFEPIME <=0.12 SENSITIVE Sensitive     CEFTRIAXONE <=0.25 SENSITIVE Sensitive     CIPROFLOXACIN <=0.25 SENSITIVE Sensitive     GENTAMICIN <=1 SENSITIVE Sensitive     IMIPENEM <=0.25 SENSITIVE Sensitive     NITROFURANTOIN 64 INTERMEDIATE Intermediate     TRIMETH/SULFA <=20 SENSITIVE Sensitive     AMPICILLIN/SULBACTAM 4 SENSITIVE Sensitive     PIP/TAZO <=4 SENSITIVE Sensitive     * >=100,000 COLONIES/mL KLEBSIELLA PNEUMONIAE  MRSA Next Gen by PCR, Nasal     Status: None   Collection Time: 10/12/21 10:19 PM   Specimen: Leg; Nasal Swab  Result Value Ref Range Status   MRSA by PCR Next Gen NOT DETECTED NOT DETECTED Final    Comment: (NOTE) The GeneXpert MRSA Assay (FDA approved for NASAL specimens only), is one component of a comprehensive MRSA colonization surveillance program. It is not intended to diagnose MRSA infection nor to guide or monitor treatment for MRSA infections. Test performance is not FDA approved in patients less than 88 years old. Performed at [facility], [place] [address] [place], [place] [address]   Aerobic Culture w Gram Stain (superficial specimen)     Status: None   Collection Time: 10/12/21 10:19 PM   Specimen: Leg  Result Value Ref Range Status   Specimen Description   Final    LEG Performed at [facility] [address], [place], [place] [address]    Special Requests   Final    NONE Performed at [facility], [place] [address], [place], [place] [address]    Gram Stain    Final  RARE SQUAMOUS EPITHELIAL CELLS PRESENT NO WBC SEEN RARE YEAST Performed at [facility], [place] [address], [place], [place] [address]    Culture   Final    FEW ENTEROCOCCUS FAECALIS FEW CANDIDA PARAPSILOSIS FEW STENOTROPHOMONAS MALTOPHILIA    Report Status 10/17/2021 FINAL  Final   Organism ID, Bacteria ENTEROCOCCUS FAECALIS  Final   Organism ID, Bacteria STENOTROPHOMONAS MALTOPHILIA  Final      Susceptibility   Enterococcus faecalis - MIC*    AMPICILLIN <=2 SENSITIVE Sensitive     VANCOMYCIN 1 SENSITIVE Sensitive     GENTAMICIN SYNERGY SENSITIVE Sensitive     * FEW ENTEROCOCCUS FAECALIS   Stenotrophomonas maltophilia - MIC*    LEVOFLOXACIN 0.25 SENSITIVE Sensitive     TRIMETH/SULFA <=20 SENSITIVE Sensitive     * FEW STENOTROPHOMONAS MALTOPHILIA         Radiology Studies: No results found.      Scheduled Meds:  amoxicillin-clavulanate  1 tablet Oral Q12H   apixaban  5 mg Oral BID   divalproex  250 mg Oral BID   divalproex  500 mg Oral BID   furosemide  40 mg Intravenous BID   levETIRAcetam  500 mg Oral BID   nicotine  21 mg Transdermal Daily   pantoprazole  80 mg Oral Q1200   PARoxetine  40 mg Oral Daily   potassium chloride  40 mEq Oral Q4H   Continuous Infusions:  sodium chloride 10 mL/hr at 10/15/21 0556          Aline August, MD [facility] 10/18/2021, 11:43 AM

## 2021-10-18 NOTE — TOC Progression Note (Addendum)
Transition of Care [facility]) - Progression Note    Patient Details  Name: Melissa Case MRN: [id number] Date of Birth: 02-Aug-1960  [facility] CM/SW Contact  Jackqueline Aquilar, Juliann Pulse, RN Phone Number: 10/18/2021, 11:49 AM  Clinical Narrative: Just notified admit,UR dept,of [employer] out of [place] [facility] unable to accept. Informed patient-she understands-wants me to talk to her spouse-left vm await call back. [facility] rep Kenzie checking if can accept.No other agency to accept. PT to continue to work with patient. Monitioring if home 02 needed-await sats, & order-[facility] rep Danielle following. Has own transport.  -2:32p-[facility] appt-she has gone there in past for unna boot wrappings-appt set. [facility] rep Ramond Marrow accepted for HHPT/OT start of care Sunday-MD aware.No home 02 needed per 02 sats currently.Awaiting call back from spouse to discuss current d/c plan.    Expected Discharge Plan: [place] Barriers to Discharge: Continued Medical Work up  Expected Discharge Plan and Services Expected Discharge Plan: [place]   Discharge Planning Services: CM Consult Post Acute Care Choice: [place] arrangements for the past 2 months: Single Family Home                                       Social Determinants of Health (SDOH) Interventions    Readmission Risk Interventions    10/14/2021    2:14 PM  Readmission Risk Prevention Plan  Transportation Screening Complete  Medication Review (RN Care Manager) Complete  PCP or Specialist appointment within 3-5 days of discharge Complete  HRI or Home Care Consult Complete  SW Recovery Care/Counseling Consult Complete  Palliative Care Screening Not [place] Complete

## 2021-10-18 NOTE — Progress Notes (Signed)
SATURATION QUALIFICATIONS: (This note is used to comply with regulatory documentation for home oxygen)  Patient Saturations on Room Air at Rest = 94%  Patient Saturations on Room Air while Ambulating = 87%  Patient Saturations on 2 Liters of oxygen while Ambulating = 91-92%  Please briefly explain why patient needs home oxygen: Patient unable to walk long distances due to weakness, patient only ambulated about 50 feet. Oxygen sats did drop to mid 80's on room air and patient experienced dyspnea on exertion. With 2L oxygen, sats went back up to low 90's and patient felt more comfortable w/ ambulation and at rest.

## 2021-10-18 NOTE — Progress Notes (Signed)
Orthopedic Tech Progress Note Patient Details:  Maanya Hippert Aug 19, 1960 [id number]  Patient ID: [name], female   DOB: 06/15/1960, 61 y.o.   MRN: [id number]  [name] 10/18/2021, 6:20 PM Bi lat unna boot re applied due to yesterdays application getting soiled by patient

## 2021-10-18 NOTE — Progress Notes (Signed)
PT NOTE  SATURATION QUALIFICATIONS: (This note is used to comply with regulatory documentation for home oxygen)  Patient Saturations on Room Air at Rest = 92%  Patient Saturations on Room Air while Ambulating = 94%  Patient Saturations on n/a Liters of oxygen while Ambulating = n/a  Please briefly explain why patient needs home oxygen:n/a    Melissa Case, [place] Dept ([place]   10/18/2021

## 2021-10-19 DIAGNOSIS — C07 Malignant neoplasm of parotid gland: Secondary | ICD-10-CM | POA: Diagnosis not present

## 2021-10-19 DIAGNOSIS — Z86711 Personal history of pulmonary embolism: Secondary | ICD-10-CM | POA: Diagnosis not present

## 2021-10-19 DIAGNOSIS — L03115 Cellulitis of right lower limb: Secondary | ICD-10-CM | POA: Diagnosis not present

## 2021-10-19 DIAGNOSIS — R531 Weakness: Secondary | ICD-10-CM | POA: Diagnosis not present

## 2021-10-19 LAB — BASIC METABOLIC PANEL
Anion gap: 10 (ref 5–15)
BUN: 5 mg/dL — ABNORMAL LOW (ref 6–20)
CO2: 34 mmol/L — ABNORMAL HIGH (ref 22–32)
Calcium: 8.6 mg/dL — ABNORMAL LOW (ref 8.9–10.3)
Chloride: 95 mmol/L — ABNORMAL LOW (ref 98–111)
Creatinine, Ser: 0.63 mg/dL (ref 0.44–1.00)
GFR, Estimated: >60 mL/min (ref >60)
Glucose, Bld: 100 mg/dL — ABNORMAL HIGH (ref 70–99)
Potassium: 3.6 mmol/L (ref 3.5–5.1)
Sodium: 139 mmol/L (ref 135–145)

## 2021-10-19 LAB — MAGNESIUM: Magnesium: 1.9 mg/dL (ref 1.7–2.4)

## 2021-10-19 MED ORDER — AMOXICILLIN-POT CLAVULANATE 875-125 MG PO TABS: 1.0000 | ORAL_TABLET | Freq: Two times a day (BID) | ORAL | 0 refills | Status: AC

## 2021-10-19 NOTE — Discharge Summary (Signed)
Physician Discharge Summary  Melissa Case OVF:[id number] DOB: Oct 18, 1960 DOA: 10/12/2021  PCP: Camillia Herter, NP  Admit date: 10/12/2021 Discharge date: 10/19/2021  Admitted From: Home Disposition: Home  Recommendations for Outpatient Follow-up:  Follow up with PCP in 1 week with repeat CBC/BMP Outpatient follow-up with oncology/ENT/neurology Follow up in ED if symptoms worsen or new appear   Home Health: TOC working on arranging home PT/OT/RN  equipment/Devices: Oxygen by nasal cannula at 2 L/min  Discharge Condition: Stable CODE STATUS: Full Diet recommendation: Heart healthy  Brief/Interim Summary: 61 year old female with 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  She was admitted and started on IV antibiotics and Lasix.  During the hospitalization, her condition has improved.  Cardiology evaluated the patient and recommended no further cardiac work-up.  She currently has Unna boots.  PT recommended SNF placement.  Patient wants to go home today adamantly.  She will be discharged home today on oral Augmentin.  Outpatient follow-up with PCP.   Discharge Diagnoses:   Acute on chronic diastolic CHF Lower extremity swelling/venous insufficiency/lymphedema -Presented with worsening lower extremity swelling -Currently has Unna boots.  Will need home health RN arrangement if possible for Unna boots to be changed twice a week.  Will need wound care clinic follow-up as well. -Currently improving.  Strict input and output.  Daily weights.  Fluid restriction. -She was evaluated by vascular surgery as an outpatient, recommending leg elevation, compression stockings but she has not been using those -Recent echo had  shown EF of 60 to 65% with grade 1 diastolic dysfunction.  Treated with IV Lasix.  Volume status has much improved.  Cardiology evaluation appreciated.  No further work-up recommended.  Recommended to continue oral Lasix on discharge.  Continue Lasix 40 mg twice a day.  Outpatient follow-up with PCP/cardiology  Acute respiratory failure with hypoxia -Possibly from above.  Has required supplemental oxygen during the hospitalization.  Oxygen saturation drops to the 80s on room air on ambulation and hence will require oxygen via nasal cannula at 2 L/min on discharge.    Right lower extremity cellulitis -she has small ulcer on the lateral side of her thigh, surrounding erythema.  Wound cultures grew initially Enterococcus, pansensitive, was placed on amoxicillin. subsequently culture showed Candida and stenotrophomonas. -Case was discussed with Dr. Drucilla Schmidt with infectious disease, he feels like superficial wound cultures are notoriously unreliable.  Given clinical improvement, recommends Augmentin for 5 to 7 days.  Discharged home on oral Augmentin -Outpatient follow-up with wound care clinic   Bilateral lower EXTR DVT/history of PE -Continue Eliquis   Hypokalemia -Improving.  Continue replacement on discharge.  Depression -Continue home paroxetine  Obesity -Outpatient follow-up  Generalized weakness Physical deconditioning -PT recommends SNF placement.  Patient wants to go home instead.  TOC working on home health PT/OT management   history of brain mass History of seizures -Continue Keppra and Depakote.  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   apixaban 5 MG Tabs tablet Commonly known as: Eliquis Take 1 tablet (5 mg total) by mouth 2 (two) times daily.   divalproex 500 MG DR tablet Commonly known as: DEPAKOTE Take 1 tablet (500 mg total) by mouth 2 (two) times daily.   divalproex 250 MG DR tablet Commonly known as: DEPAKOTE Take 1 tablet (250 mg total) by mouth 2 (two) times daily. (In addition to '500mg'$  twice daily for a total dosage of '750mg'$  BID).   esomeprazole 40 MG capsule Commonly known as: NEXIUM Take 1 capsule (40 mg total) by mouth 2 (two) times daily before a meal.   furosemide 40 MG tablet Commonly known as: LASIX Take 1 tablet (40 mg total) by mouth 2 (two) times daily.   levETIRAcetam 500 MG tablet Commonly known as: KEPPRA Take 1 tablet (500 mg total) by mouth 2 (two) times daily.   PARoxetine 40 MG tablet Commonly known as: PAXIL Take 1 tablet (40 mg total) by mouth daily.   potassium chloride SA 20 MEQ tablet Commonly known as: KLOR-CON M Take 1 tablet (20 mEq total) by mouth 2 (two) times daily.   traZODone 50 MG tablet Commonly known as: DESYREL Take 50 mg by mouth at bedtime as needed for sleep.               Durable Medical Equipment  (From admission, onward)           Start     Ordered   10/19/21 0903  For home use only DME oxygen  Once       Question Answer Comment  Length of Need 6 Months   Mode or (Route) Nasal cannula   Liters per Minute 2   Frequency Continuous (stationary and portable oxygen unit needed)   Oxygen conserving device Yes   Oxygen delivery system Gas      10/19/21 0902              Discharge Care Instructions  (From admission, onward)           Start     Ordered    10/19/21 0000  Discharge wound care:       Comments: As per wound care RN recommendations   10/19/21 0906            Contact information for follow-up providers     [facility]             . Go on 11/03/2021.   Why: '@9'$ :45a Contact information: [address] [phone] [place], [place] Follow up.   Why: [place] nursing/physical therapy/occupational therapy Contact information: [place] [address] [phone]         [facility] Follow up.   Why: oxygen Contact information: [place] [address] [phone]              Contact information for after-discharge care     Destination  [facility] Preferred SNF .   Service: Skilled Nursing Contact information: [place] [address] [phone]                    Allergies  Allergen Reactions   Morphine Itching    Consultations: Cardiology   Procedures/Studies: DG Tibia/Fibula Right  Result Date: 10/07/2021 CLINICAL DATA:  Bilateral lower extremity swelling and right lower leg wound. EXAM: RIGHT ANKLE - COMPLETE 3+ VIEW; RIGHT TIBIA AND FIBULA - 2 VIEW COMPARISON:  None Available. FINDINGS: Tibia/fibula: There is no acute fracture or dislocation. Knee alignment is maintained. There is superior patellar enthesopathy. There is diffuse soft tissue edema throughout the calf. No soft tissue gas or radiopaque foreign body is seen. Ankle: There is no acute fracture or dislocation. There is no osseous erosion or destruction. Ankle alignment is maintained. Is moderate degenerative change about the ankle with narrowing of the ankle joint. Well corticated ossific fragments adjacent to the medial malleolus may reflect sequela of prior trauma. There is Achilles enthesopathy and inferior  calcaneal spurring. There is diffuse soft tissue swelling about the ankle and foot. There is no soft tissue gas or radiopaque foreign body. IMPRESSION: Diffuse soft tissue swelling about the right calf extending into the ankle and foot with no acute osseous abnormality or osseous erosion/destruction. No soft tissue gas or radiopaque foreign body. Electronically Signed   By: Valetta Mole M.D.   On: 10/07/2021 15:13   DG Ankle Complete Right  Result Date: 10/07/2021 CLINICAL DATA:  Bilateral lower extremity swelling and right lower leg wound. EXAM: RIGHT ANKLE - COMPLETE 3+ VIEW; RIGHT TIBIA AND FIBULA - 2 VIEW COMPARISON:  None Available. FINDINGS: Tibia/fibula: There is no acute fracture or dislocation. Knee alignment is maintained. There is superior patellar enthesopathy. There is diffuse soft tissue edema throughout the calf. No soft tissue gas or radiopaque foreign body is seen. Ankle: There is no acute fracture or dislocation. There is no osseous erosion or destruction. Ankle alignment is maintained. Is moderate degenerative change about the ankle with narrowing of the ankle joint. Well corticated ossific fragments adjacent to the medial malleolus may reflect sequela of prior trauma. There is Achilles enthesopathy and inferior calcaneal spurring. There is diffuse soft tissue swelling about the ankle and foot. There is no soft tissue gas or radiopaque foreign body. IMPRESSION: Diffuse soft tissue swelling about the right calf extending into the ankle and foot with no acute osseous abnormality or osseous erosion/destruction. No soft tissue gas or radiopaque foreign body. Electronically Signed   By: Valetta Mole M.D.   On: 10/07/2021 15:13   DG Chest Port 1 View  Result Date: 10/12/2021 CLINICAL DATA:  Weakness EXAM: PORTABLE CHEST 1 VIEW COMPARISON:  Chest x-ray dated September 12, 2021; CT chest dated July 25, 2021 FINDINGS: Unchanged cardiac and mediastinal contours. Mild opacity of the right upper lung and  bibasilar atelectasis, findings are similar to recent prior CT. No new focal opacity. No large pleural effusion or pneumothorax. IMPRESSION: Mild opacity of the right upper lung and bibasilar atelectasis, findings are similar to recent prior CT. No new focal opacity. Electronically Signed   By: Yetta Glassman M.D.   On: 10/12/2021 18:35   DG Abd Portable 1V  Result Date: 10/16/2021 CLINICAL DATA:  Nausea and diarrhea. EXAM: PORTABLE ABDOMEN - 1 VIEW COMPARISON:  Abdomen and pelvis CT dated 08/17/2021. FINDINGS: Paucity of intestinal gas with no dilated bowel loops or stomach seen. Cholecystectomy clips  and surgical clips in the central lower abdomen and left pelvis. Lumbar and lower thoracic spine degenerative changes. IMPRESSION: Acute abnormality. Electronically Signed   By: Claudie Revering M.D.   On: 10/16/2021 10:16   ECHOCARDIOGRAM COMPLETE  Result Date: 10/07/2021    ECHOCARDIOGRAM REPORT   Patient Name:   SOLAE NORLING Date of Exam: 10/07/2021 Medical Rec #:  [id number]      Height:       64.0 in Accession #:    [id number]     Weight:       275.0 lb Date of Birth:  [birth year]      BSA:          2.240 m Patient Age:    89 years       BP:           128/80 mmHg Patient Gender: F              HR:           53 bpm. Exam Location:  [facility] Procedure: 2D Echo, Cardiac Doppler and Color Doppler Indications:    I26.09 Pulmonary Embolus  History:        Patient has prior history of Echocardiogram examinations, most                 recent 05/10/2021. DVT.  Sonographer:    Cresenciano Lick RDCS Referring Phys: [id number] Hortencia Conradi MEIER IMPRESSIONS  1. Left ventricular ejection fraction, by estimation, is 60 to 65%. The left ventricle has normal function. The left ventricle has no regional wall motion abnormalities. There is mild left ventricular hypertrophy. Left ventricular diastolic parameters are consistent with Grade I diastolic dysfunction (impaired relaxation).  2. Right ventricular systolic  function is normal. The right ventricular size is normal.  3. The mitral valve is normal in structure. Trivial mitral valve regurgitation. No evidence of mitral stenosis.  4. The aortic valve is tricuspid. Aortic valve regurgitation is not visualized. No aortic stenosis is present. FINDINGS  Left Ventricle: Left ventricular ejection fraction, by estimation, is 60 to 65%. The left ventricle has normal function. The left ventricle has no regional wall motion abnormalities. The left ventricular internal cavity size was normal in size. There is  mild left ventricular hypertrophy. Left ventricular diastolic parameters are consistent with Grade I diastolic dysfunction (impaired relaxation). Right Ventricle: The right ventricular size is normal. Right ventricular systolic function is normal. Left Atrium: Left atrial size was normal in size. Right Atrium: Right atrial size was normal in size. Pericardium: There is no evidence of pericardial effusion. Mitral Valve: The mitral valve is normal in structure. Mild mitral annular calcification. Trivial mitral valve regurgitation. No evidence of mitral valve stenosis. Tricuspid Valve: The tricuspid valve is normal in structure. Tricuspid valve regurgitation is not demonstrated. No evidence of tricuspid stenosis. Aortic Valve: The aortic valve is tricuspid. Aortic valve regurgitation is not visualized. No aortic stenosis is present. Pulmonic Valve: The pulmonic valve was not well visualized. Pulmonic valve regurgitation is not visualized. No evidence of pulmonic stenosis. Aorta: The aortic root is normal in size and structure. Venous: The inferior vena cava was not well visualized. IAS/Shunts: The interatrial septum was not well visualized.  LEFT VENTRICLE PLAX 2D LVIDd:         3.60 cm   Diastology LVIDs:         2.40 cm   LV e' medial:    7.93 cm/s LV PW:  1.20 cm   LV E/e' medial:  11.8 LV IVS:        1.20 cm   LV e' lateral:   10.10 cm/s LVOT diam:     2.00 cm   LV E/e'  lateral: 9.3 LV SV:         76 LV SV Index:   34 LVOT Area:     3.14 cm  RIGHT VENTRICLE             IVC RV Basal diam:  3.30 cm     IVC diam: 2.00 cm RV S prime:     12.75 cm/s TAPSE (M-mode): 2.6 cm LEFT ATRIUM             Index        RIGHT ATRIUM          Index LA diam:        4.00 cm 1.79 cm/m   RA Area:     9.52 cm LA Vol (A2C):   29.7 ml 13.26 ml/m  RA Volume:   19.30 ml 8.62 ml/m LA Vol (A4C):   26.1 ml 11.65 ml/m LA Biplane Vol: 28.5 ml 12.72 ml/m  AORTIC VALVE LVOT Vmax:   124.50 cm/s LVOT Vmean:  79.250 cm/s LVOT VTI:    0.240 m  AORTA Ao Root diam: 3.00 cm Ao Asc diam:  3.20 cm MITRAL VALVE MV Area (PHT): 3.91 cm     SHUNTS MV Decel Time: 194 msec     Systemic VTI:  0.24 m MV E velocity: 93.50 cm/s   Systemic Diam: 2.00 cm MV A velocity: 124.00 cm/s MV E/A ratio:  0.75 Kirk Ruths MD Electronically signed by Kirk Ruths MD Signature Date/Time: 10/07/2021/1:26:44 PM    Final    US THYROID  Result Date: 09/23/2021 CLINICAL DATA:  61 year old female with a history of thyroid nodules EXAM: THYROID ULTRASOUND TECHNIQUE: Ultrasound examination of the thyroid gland and adjacent soft tissues was performed. COMPARISON:  03/26/2015 FINDINGS: Parenchymal Echotexture: Markedly heterogenous Isthmus: 0.6 cm Right lobe: 6.0 cm x 2.2 cm x 2.4 cm Left lobe: 8.3 cm x 1.6 cm x 2.8 cm _________________________________________________________ Estimated total number of nodules >/= 1 cm: 5 Number of spongiform nodules >/=  2 cm not described below (TR1): 0 Number of mixed cystic and solid nodules >/= 1.5 cm not described below ([place]): 0 _________________________________________________________ Nodule # 1: Location: Right; Superior Maximum size: 1.3 cm; Other 2 dimensions: 1.1 cm x 0.8 cm Composition: cannot determine (2) Echogenicity: cannot determine (1) Shape: not taller-than-wide (0) Margins: cannot determine (0) Echogenic foci: peripheral calcifications (2) ACR TI-RADS total points: 5. ACR TI-RADS risk  category: TR4 (4-6 points). ACR TI-RADS recommendations: Nodule meets criteria for surveillance _________________________________________________________ Nodule # 2: Location: Right; Inferior Maximum size: 3.2 cm; Other 2 dimensions: 3.0 cm x 2.5 cm Composition: solid/almost completely solid (2) Echogenicity: hypoechoic (2) Shape: not taller-than-wide (0) Margins: ill-defined (0) Echogenic foci: macrocalcifications (1) ACR TI-RADS total points: 5. ACR TI-RADS risk category: TR4 (4-6 points). ACR TI-RADS recommendations: Nodule meets criteria for biopsy _________________________________________________________ Nodule # 3: Location: Left; Superior Maximum size: 2.8 cm; Other 2 dimensions: 2.2 cm x 1.5 cm Composition: cannot determine (2) Echogenicity: isoechoic (1) Shape: not taller-than-wide (0) Margins: ill-defined (0) Echogenic foci: none (0) ACR TI-RADS total points: 3. ACR TI-RADS risk category: TR3 (3 points). ACR TI-RADS recommendations: This appears to represent a pseudo nodule based on the images, however, strictly speaking this nodule meets criteria for biopsy _________________________________________________________ Nodule #  4: Location: Left; Mid Maximum size: 1.8 cm; Other 2 dimensions: 1.7 cm x 1.3 cm Composition: solid/almost completely solid (2) Echogenicity: isoechoic (1) Shape: not taller-than-wide (0) Margins: ill-defined (0) Echogenic foci: none (0) ACR TI-RADS total points: 3. ACR TI-RADS risk category: TR3 (3 points). ACR TI-RADS recommendations: Nodule meets criteria for surveillance _________________________________________________________ Nodule # 5: Location: Left; Mid Maximum size: 1.7 cm; Other 2 dimensions: 1.4 cm x 1.2 cm Composition: cannot determine (2) Echogenicity: isoechoic (1) Shape: not taller-than-wide (0) Margins: ill-defined (0) Echogenic foci: none (0) ACR TI-RADS total points: 3. ACR TI-RADS risk category: TR3 (3 points). ACR TI-RADS recommendations: Nodule meets criteria for  surveillance _________________________________________________________ No adenopathy IMPRESSION: Heterogeneous, multinodular thyroid. Right inferior thyroid nodule meets criteria for biopsy, as designated by the newly established ACR TI-RADS criteria, and referral for biopsy is recommended. Note that this nodule is essentially unchanged when compared to the remote study of 03/26/2015, and that strictly speaking the stability in size may be increased confidence in favoring benignity. Left superior thyroid nodule (labeled 3, 2.8 cm) meets criteria for biopsy, as designated by the newly established ACR TI-RADS criteria, and referral for biopsy is recommended. This region may represent a pseudo nodule in real-time imaging given the heterogeneous background and overall appearance. Right superior thyroid nodule (labeled 1, 1.3 cm), and the left mid thyroid nodules (labeled 4 and 5) meet criteria for surveillance, as designated by the newly established ACR TI-RADS criteria. Surveillance ultrasound study recommended to be performed annually up to 5 years. Recommendations follow those established by the new ACR TI-RADS criteria (J Am Coll Radiol 8469;62:952-841). Electronically Signed   By: Corrie Mckusick D.O.   On: 09/23/2021 14:28      Subjective: Patient seen and examined at bedside.  Adamant about going home today.  No overnight fever, nausea, vomiting or worsening shortness of breath reported.  Discharge Exam: Vitals:   10/18/21 2109 10/19/21 0622  BP: (!) 137/95 (!) 97/44  Pulse: (!) 108 93  Resp:  18  Temp: 97.6 F (36.4 C) 98.1 F (36.7 C)  SpO2: 99% 100%    General: Pt is alert, awake, not in acute distress.  Looks chronically ill and deconditioned.  Currently on 2 L oxygen by nasal cannula. Cardiovascular: rate controlled, S1/S2 + Respiratory: bilateral decreased breath sounds at bases Abdominal: Soft, obese, NT, ND, bowel sounds + Extremities: Bilateral lower extremity edema present with Unna  boots; no cyanosis    The results of significant diagnostics from this hospitalization (including imaging, microbiology, ancillary and laboratory) are listed below for reference.     Microbiology: Recent Results (from the past 240 hour(s))  Urine Culture     Status: Abnormal   Collection Time: 10/12/21  3:11 PM   Specimen: Urine, Clean Catch  Result Value Ref Range Status   Specimen Description   Final    URINE, CLEAN CATCH Performed at [facility], [place] [address] [place], [place] [address]    Special Requests   Final    NONE Performed at [facility], [place] [address], [place], [place] [address]    Culture (A)  Final    >=100,000 COLONIES/mL ENTEROBACTER AEROGENES >=100,000 COLONIES/mL KLEBSIELLA PNEUMONIAE    Report Status 10/14/2021 FINAL  Final   Organism ID, Bacteria ENTEROBACTER AEROGENES (A)  Final   Organism ID, Bacteria KLEBSIELLA PNEUMONIAE (A)  Final      Susceptibility   Enterobacter aerogenes - MIC*    CEFAZOLIN >=64 RESISTANT Resistant  CEFEPIME <=0.12 SENSITIVE Sensitive     CEFTRIAXONE <=0.25 SENSITIVE Sensitive     CIPROFLOXACIN <=0.25 SENSITIVE Sensitive     GENTAMICIN <=1 SENSITIVE Sensitive     IMIPENEM 2 SENSITIVE Sensitive     NITROFURANTOIN 128 RESISTANT Resistant     TRIMETH/SULFA <=20 SENSITIVE Sensitive     PIP/TAZO <=4 SENSITIVE Sensitive     * >=100,000 COLONIES/mL ENTEROBACTER AEROGENES   Klebsiella pneumoniae - MIC*    AMPICILLIN >=32 RESISTANT Resistant     CEFAZOLIN <=4 SENSITIVE Sensitive     CEFEPIME <=0.12 SENSITIVE Sensitive     CEFTRIAXONE <=0.25 SENSITIVE Sensitive     CIPROFLOXACIN <=0.25 SENSITIVE Sensitive     GENTAMICIN <=1 SENSITIVE Sensitive     IMIPENEM <=0.25 SENSITIVE Sensitive     NITROFURANTOIN 64 INTERMEDIATE Intermediate     TRIMETH/SULFA <=20 SENSITIVE Sensitive     AMPICILLIN/SULBACTAM 4 SENSITIVE Sensitive     PIP/TAZO <=4 SENSITIVE Sensitive     * >=100,000 COLONIES/mL  KLEBSIELLA PNEUMONIAE  MRSA Next Gen by PCR, Nasal     Status: None   Collection Time: 10/12/21 10:19 PM   Specimen: Leg; Nasal Swab  Result Value Ref Range Status   MRSA by PCR Next Gen NOT DETECTED NOT DETECTED Final    Comment: (NOTE) The GeneXpert MRSA Assay (FDA approved for NASAL specimens only), is one component of a comprehensive MRSA colonization surveillance program. It is not intended to diagnose MRSA infection nor to guide or monitor treatment for MRSA infections. Test performance is not FDA approved in patients less than 51 years old. Performed at [facility], [place] [address], [place], [place] [address]   Aerobic Culture w Gram Stain (superficial specimen)     Status: None   Collection Time: 10/12/21 10:19 PM   Specimen: Leg  Result Value Ref Range Status   Specimen Description   Final    LEG Performed at [facility] [address] [place], [place] [address]    Special Requests   Final    NONE Performed at [facility], [place] [address] [place], [place] [address]    Gram Stain   Final    RARE SQUAMOUS EPITHELIAL CELLS PRESENT NO WBC SEEN RARE YEAST Performed at [facility], [place] [address], [place], [place] [address]    Culture   Final    FEW ENTEROCOCCUS FAECALIS FEW CANDIDA PARAPSILOSIS FEW STENOTROPHOMONAS MALTOPHILIA    Report Status 10/17/2021 FINAL  Final   Organism ID, Bacteria ENTEROCOCCUS FAECALIS  Final   Organism ID, Bacteria STENOTROPHOMONAS MALTOPHILIA  Final      Susceptibility   Enterococcus faecalis - MIC*    AMPICILLIN <=2 SENSITIVE Sensitive     VANCOMYCIN 1 SENSITIVE Sensitive     GENTAMICIN SYNERGY SENSITIVE Sensitive     * FEW ENTEROCOCCUS FAECALIS   Stenotrophomonas maltophilia - MIC*    LEVOFLOXACIN 0.25 SENSITIVE Sensitive     TRIMETH/SULFA <=20 SENSITIVE Sensitive     * FEW STENOTROPHOMONAS MALTOPHILIA     Labs: BNP (last 3 results) Recent Labs     05/10/21 1030 06/13/21 1213 09/12/21 2151  BNP 42.4 43.5 16.0   Basic Metabolic Panel: Recent Labs  Lab 10/15/21 0451 10/16/21 0440 10/17/21 0403 10/17/21 1558 10/17/21 2236 10/18/21 0456 10/19/21 0510  NA 139 137 139  --   --  137 139  K 3.2* 3.1* 2.8* 4.1  --  3.1* 3.6  CL 96* 94* 94*  --   --  93* 95*  CO2 33* 30 33*  --   --  34* 34*  GLUCOSE 90 93 108*  --   --  106* 100*  BUN <5* <5* <5*  --   --  <5* 5*  CREATININE 0.55 0.55 0.58  --   --  0.62 0.63  CALCIUM 8.3* 8.2* 8.4*  --   --  8.4* 8.6*  MG  --  1.8 2.0  --  1.9 2.1 1.9   Liver Function Tests: Recent Labs  Lab 10/12/21 1805  AST 36  ALT 63*  ALKPHOS 76  BILITOT 0.5  PROT 5.9*  ALBUMIN 2.7*   Recent Labs  Lab 10/12/21 1805  LIPASE 25   No results for input(s): AMMONIA in the last 168 hours. CBC: Recent Labs  Lab 10/12/21 1535 10/13/21 0446 10/14/21 0531 10/15/21 0451 10/16/21 0440  WBC 5.2 4.7 4.5 4.5 3.8*  HGB 13.1 11.5* 11.5* 11.5* 13.2  HCT 41.9 37.1 36.6 35.9* 41.0  MCV 100.5* 103.9* 99.5 98.1 99.8  PLT 279 200 242 230 232   Cardiac Enzymes: No results for input(s): CKTOTAL, CKMB, CKMBINDEX, TROPONINI in the last 168 hours. BNP: Invalid input(s): POCBNP CBG: Recent Labs  Lab 10/12/21 1434  GLUCAP 145*   D-Dimer No results for input(s): DDIMER in the last 72 hours. Hgb A1c No results for input(s): HGBA1C in the last 72 hours. Lipid Profile No results for input(s): CHOL, HDL, LDLCALC, TRIG, CHOLHDL, LDLDIRECT in the last 72 hours. Thyroid function studies No results for input(s): TSH, T4TOTAL, T3FREE, THYROIDAB in the last 72 hours.  Invalid input(s): FREET3 Anemia work up No results for input(s): VITAMINB12, FOLATE, FERRITIN, TIBC, IRON, RETICCTPCT in the last 72 hours. Urinalysis    Component Value Date/Time   COLORURINE YELLOW 10/12/2021 1511   APPEARANCEUR CLEAR 10/12/2021 1511   LABSPEC 1.009 10/12/2021 1511   PHURINE 5.0 10/12/2021 1511   GLUCOSEU NEGATIVE  10/12/2021 1511   GLUCOSEU NEGATIVE 08/12/2021 1009   HGBUR SMALL (A) 10/12/2021 1511   BILIRUBINUR NEGATIVE 10/12/2021 1511   KETONESUR 5 (A) 10/12/2021 1511   PROTEINUR NEGATIVE 10/12/2021 1511   UROBILINOGEN 0.2 08/12/2021 1009   NITRITE POSITIVE (A) 10/12/2021 1511   LEUKOCYTESUR NEGATIVE 10/12/2021 1511   Sepsis Labs Invalid input(s): PROCALCITONIN,  WBC,  LACTICIDVEN Microbiology Recent Results (from the past 240 hour(s))  Urine Culture     Status: Abnormal   Collection Time: 10/12/21  3:11 PM   Specimen: Urine, Clean Catch  Result Value Ref Range Status   Specimen Description   Final    URINE, CLEAN CATCH Performed at [facility], [place] [address], [place], [place] [address]    Special Requests   Final    NONE Performed at [facility], [place] [address] [place],  [address]    Culture (A)  Final    >=100,000 COLONIES/mL ENTEROBACTER AEROGENES >=100,000 COLONIES/mL KLEBSIELLA PNEUMONIAE    Report Status 10/14/2021 FINAL  Final   Organism ID, Bacteria ENTEROBACTER AEROGENES (A)  Final   Organism ID, Bacteria KLEBSIELLA PNEUMONIAE (A)  Final      Susceptibility   Enterobacter aerogenes - MIC*    CEFAZOLIN >=64 RESISTANT Resistant     CEFEPIME <=0.12 SENSITIVE Sensitive     CEFTRIAXONE <=0.25 SENSITIVE Sensitive     CIPROFLOXACIN <=0.25 SENSITIVE Sensitive     GENTAMICIN <=1 SENSITIVE Sensitive     IMIPENEM 2 SENSITIVE Sensitive     NITROFURANTOIN 128 RESISTANT Resistant     TRIMETH/SULFA <=  20 SENSITIVE Sensitive     PIP/TAZO <=4 SENSITIVE Sensitive     * >=100,000 COLONIES/mL ENTEROBACTER AEROGENES   Klebsiella pneumoniae - MIC*    AMPICILLIN >=32 RESISTANT Resistant     CEFAZOLIN <=4 SENSITIVE Sensitive     CEFEPIME <=0.12 SENSITIVE Sensitive     CEFTRIAXONE <=0.25 SENSITIVE Sensitive     CIPROFLOXACIN <=0.25 SENSITIVE Sensitive     GENTAMICIN <=1 SENSITIVE Sensitive     IMIPENEM <=0.25 SENSITIVE Sensitive      NITROFURANTOIN 64 INTERMEDIATE Intermediate     TRIMETH/SULFA <=20 SENSITIVE Sensitive     AMPICILLIN/SULBACTAM 4 SENSITIVE Sensitive     PIP/TAZO <=4 SENSITIVE Sensitive     * >=100,000 COLONIES/mL KLEBSIELLA PNEUMONIAE  MRSA Next Gen by PCR, Nasal     Status: None   Collection Time: 10/12/21 10:19 PM   Specimen: Leg; Nasal Swab  Result Value Ref Range Status   MRSA by PCR Next Gen NOT DETECTED NOT DETECTED Final    Comment: (NOTE) The GeneXpert MRSA Assay (FDA approved for NASAL specimens only), is one component of a comprehensive MRSA colonization surveillance program. It is not intended to diagnose MRSA infection nor to guide or monitor treatment for MRSA infections. Test performance is not FDA approved in patients less than 67 years old. Performed at [facility], [place] [address], [place], [place] [address]   Aerobic Culture w Gram Stain (superficial specimen)     Status: None   Collection Time: 10/12/21 10:19 PM   Specimen: Leg  Result Value Ref Range Status   Specimen Description   Final    LEG Performed at [place] [address] [place], [place] [address]    Special Requests   Final    NONE Performed at [facility], [place] [address] [place], [place] [address]    Gram Stain   Final    RARE SQUAMOUS EPITHELIAL CELLS PRESENT NO WBC SEEN RARE YEAST Performed at [facility], [place] [address], [place], [place] [address]    Culture   Final    FEW ENTEROCOCCUS FAECALIS FEW CANDIDA PARAPSILOSIS FEW STENOTROPHOMONAS MALTOPHILIA    Report Status 10/17/2021 FINAL  Final   Organism ID, Bacteria ENTEROCOCCUS FAECALIS  Final   Organism ID, Bacteria STENOTROPHOMONAS MALTOPHILIA  Final      Susceptibility   Enterococcus faecalis - MIC*    AMPICILLIN <=2 SENSITIVE Sensitive     VANCOMYCIN 1 SENSITIVE Sensitive     GENTAMICIN SYNERGY SENSITIVE Sensitive     * FEW ENTEROCOCCUS FAECALIS   Stenotrophomonas  maltophilia - MIC*    LEVOFLOXACIN 0.25 SENSITIVE Sensitive     TRIMETH/SULFA <=20 SENSITIVE Sensitive     * FEW STENOTROPHOMONAS MALTOPHILIA     Time coordinating discharge: 35 minutes  SIGNED:   Aline August, MD  [facility] 10/19/2021, 10:07 AM

## 2021-10-19 NOTE — Plan of Care (Signed)

## 2021-10-19 NOTE — TOC Transition Note (Signed)
Transition of Care [facility]) - CM/SW Discharge Note   Patient Details  Name: Melissa Case MRN: [id number] Date of Birth: 11-04-1960  Transition of Care [facility]) CM/SW Contact:  Dessa Phi, RN Phone Number: 10/19/2021, 9:30 AM   Clinical Narrative: See prior notes.d/c home w/HHC [facility] rep [place] Plan of d/c-HHRN/PT/OT-Start of care service June 4th Sunday-Staff/patient aware & agree. Spouse already has been doing Arts administrator. Wound care center appt set. [facility] rep Andee Poles to deliver home 02 travel tank to rm prior d/c.Patient has own transport home. No further CM needs.    Final next level of care: [place] Barriers to Discharge: No Barriers Identified   Patient Goals and CMS Choice Patient states their goals for this hospitalization and ongoing recovery are:: Home CMS Medicare.gov Compare Post Acute Care list provided to:: Patient Choice offered to / list presented to : Patient  Discharge Placement                       Discharge Plan and Services   Discharge Planning Services: CM Consult Post Acute Care Choice: Home Health          DME Arranged: [facility] DME Agency: [facility] Date DME Agency Contacted: 10/19/21 Time DME Agency Contacted: [phone] Representative spoke with at DME Agency: Andee Poles HH Arranged: RN, PT, OT [facility] Agency: [place] ([place]) Date HH Agency Contacted: 10/19/21 Time [place]: 0930 Representative spoke with at [place]: [place] ([place]) Interventions     Readmission Risk Interventions    10/14/2021    2:14 PM  Readmission Risk Prevention Plan  Transportation Screening Complete  Medication Review Press photographer) Complete  PCP or Specialist appointment within 3-5 days of discharge Complete  HRI or [place] Complete  SW Recovery Care/Counseling Consult Complete  Palliative Care Screening Not [place] Complete

## 2021-10-19 NOTE — Progress Notes (Signed)
AVS and discharge instructions reviewed w/ patient and husband at the bedside. Patient and husband verbalized understanding and had no further questions.

## 2021-10-20 ENCOUNTER — Telehealth: Payer: Self-pay

## 2021-10-20 NOTE — Telephone Encounter (Signed)
Transition Care Management Follow-up Telephone Call  Call completed with patient's husband, Richard. DPR on file to speak with him. Date of discharge and from where: 10/19/2021, [facility] How have you been since you were released from the hospital? He said she is having a hard time getting around.  He was on his way to work at the time of this call.  Any questions or concerns? No- he is managing her appointments and coordinating all of her needs.  Items Reviewed: Did the pt receive and understand the discharge instructions provided? Yes  Medications obtained and verified? Yes - he said she has all of her medications and he did not have any questions about the med regime  Other? No  Any new allergies since your discharge? No  Dietary orders reviewed? No Do you have support at home? Yes , her husband  [place] and Equipment/Supplies: Were home health services ordered? yes If so, what is the name of the agency? [facility]  Has the agency set up a time to come to the patient's home? He has not heard from them yet.  Were any new equipment or medical supplies ordered?  Yes: O2 What is the name of the medical supply agency? [facility] Were you able to get the supplies/equipment? yes Do you have any questions related to the use of the equipment or supplies? No - he said she is using the O2 @ 2L almost continuously.   She has bilateral unna boots that will need changing and her husband said he has changed them in the past.     Functional Questionnaire: (I = Independent and D = Dependent) ADLs: ambulates with walker, needs assistance with personal care. Her husband manages her medications.   Follow up appointments reviewed:  PCP Hospital f/u appt confirmed? Yes  Scheduled to see Durene Fruits, NP - 10/28/2021  Specialist Hospital f/u appt confirmed? Yes  Scheduled to see wound care- 11/04/2021, pulmonary - 12/09/2021.  Are transportation arrangements needed? No  If their  condition worsens, is the pt aware to call PCP or go to the Emergency Dept.? Yes Was the patient provided with contact information for the PCP's office or ED? Yes Was to pt encouraged to call back with questions or concerns? Yes

## 2021-10-21 ENCOUNTER — Ambulatory Visit (HOSPITAL_COMMUNITY): Payer: BC Managed Care – PPO

## 2021-10-23 DIAGNOSIS — L97319 Non-pressure chronic ulcer of right ankle with unspecified severity: Secondary | ICD-10-CM | POA: Diagnosis not present

## 2021-10-23 DIAGNOSIS — B952 Enterococcus as the cause of diseases classified elsewhere: Secondary | ICD-10-CM | POA: Diagnosis not present

## 2021-10-23 DIAGNOSIS — B965 Pseudomonas (aeruginosa) (mallei) (pseudomallei) as the cause of diseases classified elsewhere: Secondary | ICD-10-CM | POA: Diagnosis not present

## 2021-10-23 DIAGNOSIS — I82503 Chronic embolism and thrombosis of unspecified deep veins of lower extremity, bilateral: Secondary | ICD-10-CM | POA: Diagnosis not present

## 2021-10-23 DIAGNOSIS — J9601 Acute respiratory failure with hypoxia: Secondary | ICD-10-CM | POA: Diagnosis not present

## 2021-10-23 DIAGNOSIS — B9689 Other specified bacterial agents as the cause of diseases classified elsewhere: Secondary | ICD-10-CM | POA: Diagnosis not present

## 2021-10-23 DIAGNOSIS — I5033 Acute on chronic diastolic (congestive) heart failure: Secondary | ICD-10-CM | POA: Diagnosis not present

## 2021-10-23 DIAGNOSIS — C7931 Secondary malignant neoplasm of brain: Secondary | ICD-10-CM | POA: Diagnosis not present

## 2021-10-23 DIAGNOSIS — K760 Fatty (change of) liver, not elsewhere classified: Secondary | ICD-10-CM | POA: Diagnosis not present

## 2021-10-23 DIAGNOSIS — F410 Panic disorder [episodic paroxysmal anxiety] without agoraphobia: Secondary | ICD-10-CM | POA: Diagnosis not present

## 2021-10-23 DIAGNOSIS — L03115 Cellulitis of right lower limb: Secondary | ICD-10-CM | POA: Diagnosis not present

## 2021-10-23 DIAGNOSIS — G40909 Epilepsy, unspecified, not intractable, without status epilepticus: Secondary | ICD-10-CM | POA: Diagnosis not present

## 2021-10-23 DIAGNOSIS — B372 Candidiasis of skin and nail: Secondary | ICD-10-CM | POA: Diagnosis not present

## 2021-10-23 DIAGNOSIS — L97819 Non-pressure chronic ulcer of other part of right lower leg with unspecified severity: Secondary | ICD-10-CM | POA: Diagnosis not present

## 2021-10-23 DIAGNOSIS — I872 Venous insufficiency (chronic) (peripheral): Secondary | ICD-10-CM | POA: Diagnosis not present

## 2021-10-23 DIAGNOSIS — F32A Depression, unspecified: Secondary | ICD-10-CM | POA: Diagnosis not present

## 2021-10-23 NOTE — Progress Notes (Deleted)
TRANSITION OF CARE VISIT   Primary Care Provider (PCP):       Durene Fruits, NP                                                     [facility] Primary [facility] at [facility] [address] [place]    [place],  [place]  [address]                                          Phone: [phone]        Fax: [phone]     Date of Admission: 10/12/2021  Date of Discharge: 10/19/2021  Transitions of Care Call: 10/20/2021  Discharged from: [facility]   Discharge Diagnosis:  Summary of Admission per MD note:  Brief/Interim Summary: 61 year old female with 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  She was admitted and started on IV antibiotics and Lasix.  During the hospitalization, her condition has improved.  Cardiology evaluated the patient and recommended no further cardiac work-up.  She currently has Unna boots.  PT recommended SNF placement.  Patient wants to go home today adamantly.  She will be discharged home today on oral Augmentin.  Outpatient follow-up with PCP.   Discharge Diagnoses:  Acute on chronic diastolic CHF Lower extremity swelling/venous insufficiency/lymphedema -Presented with worsening lower extremity swelling -Currently has Unna boots.  Will need home health RN arrangement if possible for Unna boots to be changed twice a week.  Will need wound care clinic follow-up as well. -Currently improving.  Strict input and output.  Daily weights.  Fluid restriction. -She was evaluated by vascular surgery as an outpatient, recommending leg elevation, compression stockings but she has not been using those -Recent echo had shown EF of 60 to  65% with grade 1 diastolic dysfunction.  Treated with IV Lasix.  Volume status has much improved.  Cardiology evaluation appreciated.  No further work-up recommended.  Recommended to continue oral Lasix on discharge.  Continue Lasix 40 mg twice a day.  Outpatient follow-up with PCP/cardiology   Acute respiratory failure with hypoxia -Possibly from above.  Has required supplemental oxygen during the hospitalization.  Oxygen saturation drops to the 80s on room air on ambulation and hence will require oxygen via nasal cannula at 2 L/min on discharge.     Right lower extremity cellulitis -she has small ulcer on the lateral side of her thigh, surrounding erythema.  Wound cultures grew initially Enterococcus, pansensitive, was placed on amoxicillin. subsequently culture showed Candida and stenotrophomonas. -Case was discussed with Dr. Drucilla Schmidt with infectious disease, he feels like superficial wound cultures are notoriously unreliable.  Given clinical improvement, recommends Augmentin for 5 to 7 days.  Discharged home on oral Augmentin -Outpatient follow-up with wound care clinic   Bilateral lower EXTR DVT/history of PE -Continue Eliquis   Hypokalemia -Improving.  Continue replacement on discharge.   Depression -Continue home paroxetine  Obesity -Outpatient follow-up  Generalized weakness Physical deconditioning -PT recommends SNF placement.  Patient  wants to go home instead.  TOC working on home health PT/OT management    history of brain mass History of seizures -Continue Keppra and Depakote.  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  Will need home health RN arrangement if possible for Unna boots to be changed twice a week.  Will need wound care clinic follow-up as well. -Currently improving.  Strict input and output.  Daily weights.  Fluid restriction. -She was evaluated by vascular surgery as an outpatient, recommending leg elevation, compression stockings but she has not been using those -Recent echo had shown EF of 60 to 65% with grade 1 diastolic dysfunction.  Treated with IV Lasix.  Volume status has much improved.  Cardiology evaluation appreciated.  No further work-up recommended.  Recommended to continue oral Lasix on discharge.  Continue Lasix 40 mg twice a day.  Outpatient follow-up with PCP/cardiology   Refer to cards   Acute respiratory failure with hypoxia follow-up -Possibly from above.  Has required supplemental oxygen during the hospitalization.  Oxygen saturation drops to the 80s on room air on ambulation and hence will require oxygen via nasal cannula at 2 L/min on discharge.    F/u pulmo already established 7/21 appt   Follow up with [facility], [facility]; oxygen or [facility]   Right lower extremity cellulitis follow-up -she has small ulcer on the lateral side of her thigh, surrounding erythema.  Wound cultures grew initially Enterococcus, pansensitive, was placed on amoxicillin. subsequently culture showed Candida and stenotrophomonas. -Case was discussed with Dr. Drucilla Schmidt with infectious disease, he feels like superficial wound cultures are notoriously unreliable.  Given clinical improvement, recommends Augmentin for 5 to 7 days.  Discharged home on oral Augmentin -Outpatient follow-up with wound care clinic 6/16   Bilateral lower EXTR DVT/history of PE follow-up -Continue Eliquis   F/u with vascular and pulmo (7/21) already  established  Hypokalemia follow-up -Improving.  Continue replacement on discharge.  Taking potassium pills?   Depression follow-up -Continue home paroxetine  Generalized weakness follow-up Physical deconditioning -PT recommends SNF placement.  Patient wants to go home instead.  TOC working on home health PT/OT management   Follow up with [place], [facility]; [facility] nursing/physical therapy/occupational therapy  history of brain mass follow-up History of seizures follow-up -Continue Keppra and Depakote.  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 (Yes/ No):Yes   New medications prescribed/discontinued upon discharge? (Yes/No): Yes  Barriers identified related to medications: ***  LABS  Lab Reviewed (Yes/No/NA): Yes  PHYSICAL EXAM:    ASSESSMENT AND PLAN:   PATIENT EDUCATION PROVIDED: See AVS   FOLLOW-UP (  Include any further testing or referrals): ***

## 2021-10-24 DIAGNOSIS — K760 Fatty (change of) liver, not elsewhere classified: Secondary | ICD-10-CM | POA: Diagnosis not present

## 2021-10-24 DIAGNOSIS — G40909 Epilepsy, unspecified, not intractable, without status epilepticus: Secondary | ICD-10-CM | POA: Diagnosis not present

## 2021-10-24 DIAGNOSIS — I82503 Chronic embolism and thrombosis of unspecified deep veins of lower extremity, bilateral: Secondary | ICD-10-CM | POA: Diagnosis not present

## 2021-10-24 DIAGNOSIS — C7931 Secondary malignant neoplasm of brain: Secondary | ICD-10-CM | POA: Diagnosis not present

## 2021-10-24 DIAGNOSIS — L97819 Non-pressure chronic ulcer of other part of right lower leg with unspecified severity: Secondary | ICD-10-CM | POA: Diagnosis not present

## 2021-10-24 DIAGNOSIS — F32A Depression, unspecified: Secondary | ICD-10-CM | POA: Diagnosis not present

## 2021-10-24 DIAGNOSIS — L03115 Cellulitis of right lower limb: Secondary | ICD-10-CM | POA: Diagnosis not present

## 2021-10-24 DIAGNOSIS — B965 Pseudomonas (aeruginosa) (mallei) (pseudomallei) as the cause of diseases classified elsewhere: Secondary | ICD-10-CM | POA: Diagnosis not present

## 2021-10-24 DIAGNOSIS — B9689 Other specified bacterial agents as the cause of diseases classified elsewhere: Secondary | ICD-10-CM | POA: Diagnosis not present

## 2021-10-24 DIAGNOSIS — I5033 Acute on chronic diastolic (congestive) heart failure: Secondary | ICD-10-CM | POA: Diagnosis not present

## 2021-10-24 DIAGNOSIS — I872 Venous insufficiency (chronic) (peripheral): Secondary | ICD-10-CM | POA: Diagnosis not present

## 2021-10-24 DIAGNOSIS — J9601 Acute respiratory failure with hypoxia: Secondary | ICD-10-CM | POA: Diagnosis not present

## 2021-10-24 DIAGNOSIS — B372 Candidiasis of skin and nail: Secondary | ICD-10-CM | POA: Diagnosis not present

## 2021-10-24 DIAGNOSIS — B952 Enterococcus as the cause of diseases classified elsewhere: Secondary | ICD-10-CM | POA: Diagnosis not present

## 2021-10-24 DIAGNOSIS — L97319 Non-pressure chronic ulcer of right ankle with unspecified severity: Secondary | ICD-10-CM | POA: Diagnosis not present

## 2021-10-24 DIAGNOSIS — F410 Panic disorder [episodic paroxysmal anxiety] without agoraphobia: Secondary | ICD-10-CM | POA: Diagnosis not present

## 2021-10-25 ENCOUNTER — Telehealth: Payer: Self-pay | Admitting: Family

## 2021-10-25 DIAGNOSIS — B9689 Other specified bacterial agents as the cause of diseases classified elsewhere: Secondary | ICD-10-CM | POA: Diagnosis not present

## 2021-10-25 DIAGNOSIS — B372 Candidiasis of skin and nail: Secondary | ICD-10-CM | POA: Diagnosis not present

## 2021-10-25 DIAGNOSIS — L03115 Cellulitis of right lower limb: Secondary | ICD-10-CM | POA: Diagnosis not present

## 2021-10-25 DIAGNOSIS — L97319 Non-pressure chronic ulcer of right ankle with unspecified severity: Secondary | ICD-10-CM | POA: Diagnosis not present

## 2021-10-25 DIAGNOSIS — L97819 Non-pressure chronic ulcer of other part of right lower leg with unspecified severity: Secondary | ICD-10-CM | POA: Diagnosis not present

## 2021-10-25 DIAGNOSIS — B965 Pseudomonas (aeruginosa) (mallei) (pseudomallei) as the cause of diseases classified elsewhere: Secondary | ICD-10-CM | POA: Diagnosis not present

## 2021-10-25 DIAGNOSIS — F32A Depression, unspecified: Secondary | ICD-10-CM | POA: Diagnosis not present

## 2021-10-25 DIAGNOSIS — J9601 Acute respiratory failure with hypoxia: Secondary | ICD-10-CM | POA: Diagnosis not present

## 2021-10-25 DIAGNOSIS — K760 Fatty (change of) liver, not elsewhere classified: Secondary | ICD-10-CM | POA: Diagnosis not present

## 2021-10-25 DIAGNOSIS — G40909 Epilepsy, unspecified, not intractable, without status epilepticus: Secondary | ICD-10-CM | POA: Diagnosis not present

## 2021-10-25 DIAGNOSIS — B952 Enterococcus as the cause of diseases classified elsewhere: Secondary | ICD-10-CM | POA: Diagnosis not present

## 2021-10-25 DIAGNOSIS — I5033 Acute on chronic diastolic (congestive) heart failure: Secondary | ICD-10-CM | POA: Diagnosis not present

## 2021-10-25 DIAGNOSIS — I872 Venous insufficiency (chronic) (peripheral): Secondary | ICD-10-CM | POA: Diagnosis not present

## 2021-10-25 DIAGNOSIS — C7931 Secondary malignant neoplasm of brain: Secondary | ICD-10-CM | POA: Diagnosis not present

## 2021-10-25 DIAGNOSIS — F410 Panic disorder [episodic paroxysmal anxiety] without agoraphobia: Secondary | ICD-10-CM | POA: Diagnosis not present

## 2021-10-25 DIAGNOSIS — I82503 Chronic embolism and thrombosis of unspecified deep veins of lower extremity, bilateral: Secondary | ICD-10-CM | POA: Diagnosis not present

## 2021-10-25 NOTE — Telephone Encounter (Signed)
Home Health Verbal Orders - Caller/Agency: Stevie Kern with [facility] Number: [phone] Requesting:  PT Frequency: 2 wk 2 1 wk 2

## 2021-10-26 NOTE — Telephone Encounter (Signed)
Called to provide verbal orders for PT no ans lvm

## 2021-10-26 NOTE — Telephone Encounter (Signed)
Spoke to [place] approved verbal PT orders for 2x week for 2 weeks and 1x a week for 2 weeks

## 2021-10-28 ENCOUNTER — Ambulatory Visit: Payer: BC Managed Care – PPO | Admitting: Family

## 2021-10-28 DIAGNOSIS — Z85818 Personal history of malignant neoplasm of other sites of lip, oral cavity, and pharynx: Secondary | ICD-10-CM

## 2021-10-28 DIAGNOSIS — Z09 Encounter for follow-up examination after completed treatment for conditions other than malignant neoplasm: Secondary | ICD-10-CM

## 2021-10-28 DIAGNOSIS — K760 Fatty (change of) liver, not elsewhere classified: Secondary | ICD-10-CM | POA: Diagnosis not present

## 2021-10-28 DIAGNOSIS — I5033 Acute on chronic diastolic (congestive) heart failure: Secondary | ICD-10-CM | POA: Diagnosis not present

## 2021-10-28 DIAGNOSIS — I82503 Chronic embolism and thrombosis of unspecified deep veins of lower extremity, bilateral: Secondary | ICD-10-CM | POA: Diagnosis not present

## 2021-10-28 DIAGNOSIS — Z86711 Personal history of pulmonary embolism: Secondary | ICD-10-CM

## 2021-10-28 DIAGNOSIS — L97319 Non-pressure chronic ulcer of right ankle with unspecified severity: Secondary | ICD-10-CM | POA: Diagnosis not present

## 2021-10-28 DIAGNOSIS — R531 Weakness: Secondary | ICD-10-CM

## 2021-10-28 DIAGNOSIS — G40909 Epilepsy, unspecified, not intractable, without status epilepticus: Secondary | ICD-10-CM | POA: Diagnosis not present

## 2021-10-28 DIAGNOSIS — L03115 Cellulitis of right lower limb: Secondary | ICD-10-CM

## 2021-10-28 DIAGNOSIS — F410 Panic disorder [episodic paroxysmal anxiety] without agoraphobia: Secondary | ICD-10-CM | POA: Diagnosis not present

## 2021-10-28 DIAGNOSIS — Z86718 Personal history of other venous thrombosis and embolism: Secondary | ICD-10-CM

## 2021-10-28 DIAGNOSIS — E876 Hypokalemia: Secondary | ICD-10-CM

## 2021-10-28 DIAGNOSIS — B965 Pseudomonas (aeruginosa) (mallei) (pseudomallei) as the cause of diseases classified elsewhere: Secondary | ICD-10-CM | POA: Diagnosis not present

## 2021-10-28 DIAGNOSIS — C7931 Secondary malignant neoplasm of brain: Secondary | ICD-10-CM | POA: Diagnosis not present

## 2021-10-28 DIAGNOSIS — F32A Depression, unspecified: Secondary | ICD-10-CM | POA: Diagnosis not present

## 2021-10-28 DIAGNOSIS — L97819 Non-pressure chronic ulcer of other part of right lower leg with unspecified severity: Secondary | ICD-10-CM | POA: Diagnosis not present

## 2021-10-28 DIAGNOSIS — R569 Unspecified convulsions: Secondary | ICD-10-CM

## 2021-10-28 DIAGNOSIS — J9601 Acute respiratory failure with hypoxia: Secondary | ICD-10-CM

## 2021-10-28 DIAGNOSIS — B9689 Other specified bacterial agents as the cause of diseases classified elsewhere: Secondary | ICD-10-CM | POA: Diagnosis not present

## 2021-10-28 DIAGNOSIS — I872 Venous insufficiency (chronic) (peripheral): Secondary | ICD-10-CM | POA: Diagnosis not present

## 2021-10-28 DIAGNOSIS — F419 Anxiety disorder, unspecified: Secondary | ICD-10-CM

## 2021-10-28 DIAGNOSIS — B372 Candidiasis of skin and nail: Secondary | ICD-10-CM | POA: Diagnosis not present

## 2021-10-28 DIAGNOSIS — G9389 Other specified disorders of brain: Secondary | ICD-10-CM

## 2021-10-28 DIAGNOSIS — B952 Enterococcus as the cause of diseases classified elsewhere: Secondary | ICD-10-CM | POA: Diagnosis not present

## 2021-10-29 DIAGNOSIS — B952 Enterococcus as the cause of diseases classified elsewhere: Secondary | ICD-10-CM | POA: Diagnosis not present

## 2021-10-29 DIAGNOSIS — C7931 Secondary malignant neoplasm of brain: Secondary | ICD-10-CM | POA: Diagnosis not present

## 2021-10-29 DIAGNOSIS — L03115 Cellulitis of right lower limb: Secondary | ICD-10-CM | POA: Diagnosis not present

## 2021-10-29 DIAGNOSIS — L97319 Non-pressure chronic ulcer of right ankle with unspecified severity: Secondary | ICD-10-CM | POA: Diagnosis not present

## 2021-10-29 DIAGNOSIS — B965 Pseudomonas (aeruginosa) (mallei) (pseudomallei) as the cause of diseases classified elsewhere: Secondary | ICD-10-CM | POA: Diagnosis not present

## 2021-10-29 DIAGNOSIS — I872 Venous insufficiency (chronic) (peripheral): Secondary | ICD-10-CM | POA: Diagnosis not present

## 2021-10-29 DIAGNOSIS — B9689 Other specified bacterial agents as the cause of diseases classified elsewhere: Secondary | ICD-10-CM | POA: Diagnosis not present

## 2021-10-29 DIAGNOSIS — J9601 Acute respiratory failure with hypoxia: Secondary | ICD-10-CM | POA: Diagnosis not present

## 2021-10-29 DIAGNOSIS — L97819 Non-pressure chronic ulcer of other part of right lower leg with unspecified severity: Secondary | ICD-10-CM | POA: Diagnosis not present

## 2021-10-29 DIAGNOSIS — K760 Fatty (change of) liver, not elsewhere classified: Secondary | ICD-10-CM | POA: Diagnosis not present

## 2021-10-29 DIAGNOSIS — B372 Candidiasis of skin and nail: Secondary | ICD-10-CM | POA: Diagnosis not present

## 2021-10-29 DIAGNOSIS — F410 Panic disorder [episodic paroxysmal anxiety] without agoraphobia: Secondary | ICD-10-CM | POA: Diagnosis not present

## 2021-10-29 DIAGNOSIS — I5033 Acute on chronic diastolic (congestive) heart failure: Secondary | ICD-10-CM | POA: Diagnosis not present

## 2021-10-29 DIAGNOSIS — F32A Depression, unspecified: Secondary | ICD-10-CM | POA: Diagnosis not present

## 2021-10-29 DIAGNOSIS — I82503 Chronic embolism and thrombosis of unspecified deep veins of lower extremity, bilateral: Secondary | ICD-10-CM | POA: Diagnosis not present

## 2021-10-29 DIAGNOSIS — G40909 Epilepsy, unspecified, not intractable, without status epilepticus: Secondary | ICD-10-CM | POA: Diagnosis not present

## 2021-10-31 DIAGNOSIS — L03115 Cellulitis of right lower limb: Secondary | ICD-10-CM | POA: Diagnosis not present

## 2021-10-31 DIAGNOSIS — F410 Panic disorder [episodic paroxysmal anxiety] without agoraphobia: Secondary | ICD-10-CM | POA: Diagnosis not present

## 2021-10-31 DIAGNOSIS — G40909 Epilepsy, unspecified, not intractable, without status epilepticus: Secondary | ICD-10-CM | POA: Diagnosis not present

## 2021-10-31 DIAGNOSIS — J9601 Acute respiratory failure with hypoxia: Secondary | ICD-10-CM | POA: Diagnosis not present

## 2021-10-31 DIAGNOSIS — I82503 Chronic embolism and thrombosis of unspecified deep veins of lower extremity, bilateral: Secondary | ICD-10-CM | POA: Diagnosis not present

## 2021-10-31 DIAGNOSIS — I872 Venous insufficiency (chronic) (peripheral): Secondary | ICD-10-CM | POA: Diagnosis not present

## 2021-10-31 DIAGNOSIS — C7931 Secondary malignant neoplasm of brain: Secondary | ICD-10-CM | POA: Diagnosis not present

## 2021-10-31 DIAGNOSIS — B9689 Other specified bacterial agents as the cause of diseases classified elsewhere: Secondary | ICD-10-CM | POA: Diagnosis not present

## 2021-10-31 DIAGNOSIS — B965 Pseudomonas (aeruginosa) (mallei) (pseudomallei) as the cause of diseases classified elsewhere: Secondary | ICD-10-CM | POA: Diagnosis not present

## 2021-10-31 DIAGNOSIS — K760 Fatty (change of) liver, not elsewhere classified: Secondary | ICD-10-CM | POA: Diagnosis not present

## 2021-10-31 DIAGNOSIS — L97819 Non-pressure chronic ulcer of other part of right lower leg with unspecified severity: Secondary | ICD-10-CM | POA: Diagnosis not present

## 2021-10-31 DIAGNOSIS — I5033 Acute on chronic diastolic (congestive) heart failure: Secondary | ICD-10-CM | POA: Diagnosis not present

## 2021-10-31 DIAGNOSIS — L97319 Non-pressure chronic ulcer of right ankle with unspecified severity: Secondary | ICD-10-CM | POA: Diagnosis not present

## 2021-10-31 DIAGNOSIS — B952 Enterococcus as the cause of diseases classified elsewhere: Secondary | ICD-10-CM | POA: Diagnosis not present

## 2021-10-31 DIAGNOSIS — F32A Depression, unspecified: Secondary | ICD-10-CM | POA: Diagnosis not present

## 2021-10-31 DIAGNOSIS — B372 Candidiasis of skin and nail: Secondary | ICD-10-CM | POA: Diagnosis not present

## 2021-11-01 ENCOUNTER — Encounter (HOSPITAL_COMMUNITY): Payer: Self-pay | Admitting: Emergency Medicine

## 2021-11-01 ENCOUNTER — Emergency Department (HOSPITAL_COMMUNITY): Payer: BC Managed Care – PPO

## 2021-11-01 ENCOUNTER — Other Ambulatory Visit: Payer: Self-pay

## 2021-11-01 ENCOUNTER — Inpatient Hospital Stay (HOSPITAL_COMMUNITY)
Admission: EM | Admit: 2021-11-01 | Discharge: 2021-11-19 | DRG: 689 | Disposition: E | Payer: BC Managed Care – PPO | Attending: Pulmonary Disease | Admitting: Pulmonary Disease

## 2021-11-01 DIAGNOSIS — Z923 Personal history of irradiation: Secondary | ICD-10-CM

## 2021-11-01 DIAGNOSIS — R569 Unspecified convulsions: Secondary | ICD-10-CM

## 2021-11-01 DIAGNOSIS — I959 Hypotension, unspecified: Secondary | ICD-10-CM | POA: Diagnosis not present

## 2021-11-01 DIAGNOSIS — N3000 Acute cystitis without hematuria: Secondary | ICD-10-CM | POA: Diagnosis not present

## 2021-11-01 DIAGNOSIS — R110 Nausea: Secondary | ICD-10-CM | POA: Diagnosis not present

## 2021-11-01 DIAGNOSIS — J690 Pneumonitis due to inhalation of food and vomit: Secondary | ICD-10-CM | POA: Diagnosis not present

## 2021-11-01 DIAGNOSIS — C7931 Secondary malignant neoplasm of brain: Secondary | ICD-10-CM | POA: Diagnosis not present

## 2021-11-01 DIAGNOSIS — M7981 Nontraumatic hematoma of soft tissue: Secondary | ICD-10-CM | POA: Diagnosis not present

## 2021-11-01 DIAGNOSIS — Z833 Family history of diabetes mellitus: Secondary | ICD-10-CM

## 2021-11-01 DIAGNOSIS — Z85841 Personal history of malignant neoplasm of brain: Secondary | ICD-10-CM | POA: Diagnosis not present

## 2021-11-01 DIAGNOSIS — Z79899 Other long term (current) drug therapy: Secondary | ICD-10-CM

## 2021-11-01 DIAGNOSIS — I5033 Acute on chronic diastolic (congestive) heart failure: Secondary | ICD-10-CM | POA: Diagnosis not present

## 2021-11-01 DIAGNOSIS — R4182 Altered mental status, unspecified: Secondary | ICD-10-CM | POA: Diagnosis not present

## 2021-11-01 DIAGNOSIS — G9389 Other specified disorders of brain: Secondary | ICD-10-CM | POA: Diagnosis not present

## 2021-11-01 DIAGNOSIS — F330 Major depressive disorder, recurrent, mild: Secondary | ICD-10-CM | POA: Diagnosis not present

## 2021-11-01 DIAGNOSIS — G40909 Epilepsy, unspecified, not intractable, without status epilepticus: Secondary | ICD-10-CM | POA: Diagnosis present

## 2021-11-01 DIAGNOSIS — D62 Acute posthemorrhagic anemia: Secondary | ICD-10-CM | POA: Diagnosis not present

## 2021-11-01 DIAGNOSIS — Z85818 Personal history of malignant neoplasm of other sites of lip, oral cavity, and pharynx: Secondary | ICD-10-CM

## 2021-11-01 DIAGNOSIS — Z9981 Dependence on supplemental oxygen: Secondary | ICD-10-CM

## 2021-11-01 DIAGNOSIS — N390 Urinary tract infection, site not specified: Secondary | ICD-10-CM | POA: Diagnosis not present

## 2021-11-01 DIAGNOSIS — E876 Hypokalemia: Secondary | ICD-10-CM | POA: Diagnosis present

## 2021-11-01 DIAGNOSIS — R570 Cardiogenic shock: Secondary | ICD-10-CM | POA: Diagnosis not present

## 2021-11-01 DIAGNOSIS — Z6841 Body Mass Index (BMI) 40.0 and over, adult: Secondary | ICD-10-CM

## 2021-11-01 DIAGNOSIS — C07 Malignant neoplasm of parotid gland: Secondary | ICD-10-CM | POA: Diagnosis not present

## 2021-11-01 DIAGNOSIS — I110 Hypertensive heart disease with heart failure: Secondary | ICD-10-CM | POA: Diagnosis present

## 2021-11-01 DIAGNOSIS — R0602 Shortness of breath: Secondary | ICD-10-CM | POA: Diagnosis not present

## 2021-11-01 DIAGNOSIS — G9341 Metabolic encephalopathy: Secondary | ICD-10-CM | POA: Diagnosis present

## 2021-11-01 DIAGNOSIS — K219 Gastro-esophageal reflux disease without esophagitis: Secondary | ICD-10-CM | POA: Diagnosis present

## 2021-11-01 DIAGNOSIS — Z885 Allergy status to narcotic agent status: Secondary | ICD-10-CM

## 2021-11-01 DIAGNOSIS — C786 Secondary malignant neoplasm of retroperitoneum and peritoneum: Secondary | ICD-10-CM | POA: Diagnosis present

## 2021-11-01 DIAGNOSIS — E52 Niacin deficiency [pellagra]: Secondary | ICD-10-CM | POA: Diagnosis not present

## 2021-11-01 DIAGNOSIS — F32A Depression, unspecified: Secondary | ICD-10-CM | POA: Diagnosis not present

## 2021-11-01 DIAGNOSIS — Z86718 Personal history of other venous thrombosis and embolism: Secondary | ICD-10-CM

## 2021-11-01 DIAGNOSIS — Z4682 Encounter for fitting and adjustment of non-vascular catheter: Secondary | ICD-10-CM | POA: Diagnosis not present

## 2021-11-01 DIAGNOSIS — Z66 Do not resuscitate: Secondary | ICD-10-CM | POA: Diagnosis not present

## 2021-11-01 DIAGNOSIS — R112 Nausea with vomiting, unspecified: Secondary | ICD-10-CM | POA: Diagnosis not present

## 2021-11-01 DIAGNOSIS — E1165 Type 2 diabetes mellitus with hyperglycemia: Secondary | ICD-10-CM | POA: Diagnosis not present

## 2021-11-01 DIAGNOSIS — Z8249 Family history of ischemic heart disease and other diseases of the circulatory system: Secondary | ICD-10-CM

## 2021-11-01 DIAGNOSIS — H9190 Unspecified hearing loss, unspecified ear: Secondary | ICD-10-CM | POA: Diagnosis present

## 2021-11-01 DIAGNOSIS — I6789 Other cerebrovascular disease: Secondary | ICD-10-CM | POA: Diagnosis not present

## 2021-11-01 DIAGNOSIS — Z86711 Personal history of pulmonary embolism: Secondary | ICD-10-CM | POA: Diagnosis not present

## 2021-11-01 DIAGNOSIS — Z7901 Long term (current) use of anticoagulants: Secondary | ICD-10-CM

## 2021-11-01 DIAGNOSIS — Y848 Other medical procedures as the cause of abnormal reaction of the patient, or of later complication, without mention of misadventure at the time of the procedure: Secondary | ICD-10-CM | POA: Diagnosis not present

## 2021-11-01 DIAGNOSIS — R1310 Dysphagia, unspecified: Secondary | ICD-10-CM | POA: Diagnosis not present

## 2021-11-01 DIAGNOSIS — J9601 Acute respiratory failure with hypoxia: Secondary | ICD-10-CM | POA: Diagnosis not present

## 2021-11-01 DIAGNOSIS — I471 Supraventricular tachycardia: Secondary | ICD-10-CM | POA: Diagnosis not present

## 2021-11-01 DIAGNOSIS — B9689 Other specified bacterial agents as the cause of diseases classified elsewhere: Secondary | ICD-10-CM | POA: Diagnosis present

## 2021-11-01 DIAGNOSIS — R531 Weakness: Secondary | ICD-10-CM | POA: Diagnosis not present

## 2021-11-01 DIAGNOSIS — I480 Paroxysmal atrial fibrillation: Secondary | ICD-10-CM | POA: Diagnosis not present

## 2021-11-01 DIAGNOSIS — Y842 Radiological procedure and radiotherapy as the cause of abnormal reaction of the patient, or of later complication, without mention of misadventure at the time of the procedure: Secondary | ICD-10-CM | POA: Diagnosis not present

## 2021-11-01 DIAGNOSIS — L7632 Postprocedural hematoma of skin and subcutaneous tissue following other procedure: Secondary | ICD-10-CM | POA: Diagnosis not present

## 2021-11-01 DIAGNOSIS — I82409 Acute embolism and thrombosis of unspecified deep veins of unspecified lower extremity: Secondary | ICD-10-CM | POA: Diagnosis present

## 2021-11-01 DIAGNOSIS — E861 Hypovolemia: Secondary | ICD-10-CM | POA: Diagnosis not present

## 2021-11-01 DIAGNOSIS — Z803 Family history of malignant neoplasm of breast: Secondary | ICD-10-CM

## 2021-11-01 DIAGNOSIS — Z87891 Personal history of nicotine dependence: Secondary | ICD-10-CM

## 2021-11-01 DIAGNOSIS — R410 Disorientation, unspecified: Secondary | ICD-10-CM | POA: Diagnosis not present

## 2021-11-01 DIAGNOSIS — Z7952 Long term (current) use of systemic steroids: Secondary | ICD-10-CM

## 2021-11-01 DIAGNOSIS — R918 Other nonspecific abnormal finding of lung field: Secondary | ICD-10-CM | POA: Diagnosis not present

## 2021-11-01 DIAGNOSIS — I824Y3 Acute embolism and thrombosis of unspecified deep veins of proximal lower extremity, bilateral: Secondary | ICD-10-CM | POA: Diagnosis not present

## 2021-11-01 DIAGNOSIS — R578 Other shock: Secondary | ICD-10-CM | POA: Diagnosis not present

## 2021-11-01 DIAGNOSIS — T380X5A Adverse effect of glucocorticoids and synthetic analogues, initial encounter: Secondary | ICD-10-CM | POA: Diagnosis not present

## 2021-11-01 DIAGNOSIS — F05 Delirium due to known physiological condition: Secondary | ICD-10-CM | POA: Diagnosis not present

## 2021-11-01 DIAGNOSIS — H6591 Unspecified nonsuppurative otitis media, right ear: Secondary | ICD-10-CM

## 2021-11-01 DIAGNOSIS — E860 Dehydration: Secondary | ICD-10-CM | POA: Diagnosis present

## 2021-11-01 DIAGNOSIS — G936 Cerebral edema: Secondary | ICD-10-CM | POA: Diagnosis not present

## 2021-11-01 DIAGNOSIS — I5032 Chronic diastolic (congestive) heart failure: Secondary | ICD-10-CM

## 2021-11-01 DIAGNOSIS — E66813 Obesity, class 3: Secondary | ICD-10-CM

## 2021-11-01 DIAGNOSIS — R29810 Facial weakness: Secondary | ICD-10-CM | POA: Diagnosis not present

## 2021-11-01 DIAGNOSIS — I872 Venous insufficiency (chronic) (peripheral): Secondary | ICD-10-CM | POA: Diagnosis present

## 2021-11-01 DIAGNOSIS — N3289 Other specified disorders of bladder: Secondary | ICD-10-CM | POA: Diagnosis not present

## 2021-11-01 DIAGNOSIS — I517 Cardiomegaly: Secondary | ICD-10-CM | POA: Diagnosis not present

## 2021-11-01 DIAGNOSIS — Z818 Family history of other mental and behavioral disorders: Secondary | ICD-10-CM

## 2021-11-01 DIAGNOSIS — K760 Fatty (change of) liver, not elsewhere classified: Secondary | ICD-10-CM | POA: Diagnosis not present

## 2021-11-01 LAB — URINALYSIS, ROUTINE W REFLEX MICROSCOPIC
Glucose, UA: NEGATIVE mg/dL
Ketones, ur: 20 mg/dL — AB
Nitrite: POSITIVE — AB
Protein, ur: 100 mg/dL — AB
RBC / HPF: >50 RBC/hpf — ABNORMAL HIGH (ref 0–5)
Specific Gravity, Urine: 1.025 (ref 1.005–1.030)
Squamous Epithelial / HPF: >50 — ABNORMAL HIGH (ref 0–5)
WBC, UA: >50 WBC/hpf — ABNORMAL HIGH (ref 0–5)
pH: 5 (ref 5.0–8.0)

## 2021-11-01 LAB — CBC WITH DIFFERENTIAL/PLATELET
Abs Immature Granulocytes: 0.38 10*3/uL — ABNORMAL HIGH (ref 0.00–0.07)
Basophils Absolute: 0.1 10*3/uL (ref 0.0–0.1)
Basophils Relative: 2 %
Eosinophils Absolute: 0 10*3/uL (ref 0.0–0.5)
Eosinophils Relative: 0 %
HCT: 42 % (ref 36.0–46.0)
Hemoglobin: 13.1 g/dL (ref 12.0–15.0)
Immature Granulocytes: 6 %
Lymphocytes Relative: 14 %
Lymphs Abs: 0.8 10*3/uL (ref 0.7–4.0)
MCH: 31.4 pg (ref 26.0–34.0)
MCHC: 31.2 g/dL (ref 30.0–36.0)
MCV: 100.7 fL — ABNORMAL HIGH (ref 80.0–100.0)
Monocytes Absolute: 1.2 10*3/uL — ABNORMAL HIGH (ref 0.1–1.0)
Monocytes Relative: 20 %
Neutro Abs: 3.5 10*3/uL (ref 1.7–7.7)
Neutrophils Relative %: 58 %
Platelets: 250 10*3/uL (ref 150–400)
RBC: 4.17 MIL/uL (ref 3.87–5.11)
RDW: 17 % — ABNORMAL HIGH (ref 11.5–15.5)
WBC: 6.1 10*3/uL (ref 4.0–10.5)
nRBC: 0 % (ref 0.0–0.2)

## 2021-11-01 LAB — BLOOD GAS, VENOUS
Acid-Base Excess: 12.3 mmol/L — ABNORMAL HIGH (ref 0.0–2.0)
Bicarbonate: 38.4 mmol/L — ABNORMAL HIGH (ref 20.0–28.0)
O2 Saturation: 70.3 %
Patient temperature: 37
pCO2, Ven: 54 mmHg (ref 44–60)
pH, Ven: 7.46 — ABNORMAL HIGH (ref 7.25–7.43)
pO2, Ven: 44 mmHg (ref 32–45)

## 2021-11-01 LAB — RAPID URINE DRUG SCREEN, HOSP PERFORMED
Amphetamines: NOT DETECTED
Barbiturates: NOT DETECTED
Benzodiazepines: NOT DETECTED
Cocaine: NOT DETECTED
Opiates: NOT DETECTED
Tetrahydrocannabinol: NOT DETECTED

## 2021-11-01 LAB — COMPREHENSIVE METABOLIC PANEL
ALT: 31 U/L (ref 0–44)
AST: 52 U/L — ABNORMAL HIGH (ref 15–41)
Albumin: 2.2 g/dL — ABNORMAL LOW (ref 3.5–5.0)
Alkaline Phosphatase: 95 U/L (ref 38–126)
Anion gap: 11 (ref 5–15)
BUN: 7 mg/dL (ref 6–20)
CO2: 32 mmol/L (ref 22–32)
Calcium: 8 mg/dL — ABNORMAL LOW (ref 8.9–10.3)
Chloride: 98 mmol/L (ref 98–111)
Creatinine, Ser: 0.57 mg/dL (ref 0.44–1.00)
GFR, Estimated: >60 mL/min (ref >60)
Glucose, Bld: 92 mg/dL (ref 70–99)
Potassium: 3 mmol/L — ABNORMAL LOW (ref 3.5–5.1)
Sodium: 141 mmol/L (ref 135–145)
Total Bilirubin: 0.7 mg/dL (ref 0.3–1.2)
Total Protein: 5.2 g/dL — ABNORMAL LOW (ref 6.5–8.1)

## 2021-11-01 LAB — VALPROIC ACID LEVEL: Valproic Acid Lvl: 56 ug/mL (ref 50.0–100.0)

## 2021-11-01 LAB — AMMONIA: Ammonia: 37 umol/L — ABNORMAL HIGH (ref 9–35)

## 2021-11-01 LAB — CBG MONITORING, ED: Glucose-Capillary: 91 mg/dL (ref 70–99)

## 2021-11-01 MED ORDER — ONDANSETRON HCL 4 MG/2ML IJ SOLN: 4.0000 mg | Freq: Four times a day (QID) | INTRAMUSCULAR | Status: DC | PRN

## 2021-11-01 MED ORDER — PAROXETINE HCL 20 MG PO TABS
40.0000 mg | ORAL_TABLET | Freq: Every day | ORAL | Status: DC
Start: 2021-11-02 — End: 2021-11-12
  Administered 2021-11-09 – 2021-11-12 (×4): 40 mg via ORAL
  Filled 2021-11-01 (×5): qty 2

## 2021-11-01 MED ORDER — ACETAMINOPHEN 325 MG PO TABS
650.0000 mg | ORAL_TABLET | Freq: Four times a day (QID) | ORAL | Status: DC | PRN
  Filled 2021-11-01: qty 2

## 2021-11-01 MED ORDER — PANTOPRAZOLE SODIUM 40 MG PO TBEC: 80.0000 mg | DELAYED_RELEASE_TABLET | Freq: Every day | ORAL | Status: DC

## 2021-11-01 MED ORDER — DIVALPROEX SODIUM 250 MG PO DR TAB
500.0000 mg | DELAYED_RELEASE_TABLET | Freq: Two times a day (BID) | ORAL | Status: DC
  Filled 2021-11-01: qty 2

## 2021-11-01 MED ORDER — ACETAMINOPHEN 650 MG RE SUPP: 650.0000 mg | Freq: Four times a day (QID) | RECTAL | Status: DC | PRN

## 2021-11-01 MED ORDER — DIVALPROEX SODIUM 250 MG PO DR TAB
250.0000 mg | DELAYED_RELEASE_TABLET | Freq: Two times a day (BID) | ORAL | Status: DC
  Filled 2021-11-01: qty 1

## 2021-11-01 MED ORDER — SODIUM CHLORIDE 0.9 % IV SOLN
2.0000 g | INTRAVENOUS | Status: DC
  Administered 2021-11-02 – 2021-11-03 (×2): 2 g via INTRAVENOUS
  Filled 2021-11-01 (×2): qty 20

## 2021-11-01 MED ORDER — POTASSIUM CHLORIDE CRYS ER 20 MEQ PO TBCR
40.0000 meq | EXTENDED_RELEASE_TABLET | Freq: Two times a day (BID) | ORAL | Status: DC
  Filled 2021-11-01: qty 2

## 2021-11-01 MED ORDER — SODIUM CHLORIDE 0.9 % IV SOLN: INTRAVENOUS | Status: DC

## 2021-11-01 MED ORDER — ONDANSETRON HCL 4 MG PO TABS: 4.0000 mg | ORAL_TABLET | Freq: Four times a day (QID) | ORAL | Status: DC | PRN

## 2021-11-01 MED ORDER — APIXABAN 5 MG PO TABS
5.0000 mg | ORAL_TABLET | Freq: Two times a day (BID) | ORAL | Status: DC
  Filled 2021-11-01: qty 1

## 2021-11-01 MED ORDER — SODIUM CHLORIDE 0.9 % IV BOLUS
500.0000 mL | Freq: Once | INTRAVENOUS | Status: AC
Start: 2021-11-01 — End: 2021-11-01
  Administered 2021-11-01: 500 mL via INTRAVENOUS

## 2021-11-01 MED ORDER — SODIUM CHLORIDE 0.9 % IV SOLN
2.0000 g | Freq: Once | INTRAVENOUS | Status: AC
  Administered 2021-11-01: 2 g via INTRAVENOUS
  Filled 2021-11-01: qty 20

## 2021-11-01 MED ORDER — VALPROATE SODIUM 100 MG/ML IV SOLN
750.0000 mg | Freq: Once | INTRAVENOUS | Status: AC
  Administered 2021-11-02: 750 mg via INTRAVENOUS
  Filled 2021-11-01: qty 7.5

## 2021-11-01 MED ORDER — LEVETIRACETAM 500 MG PO TABS
500.0000 mg | ORAL_TABLET | Freq: Two times a day (BID) | ORAL | Status: DC
  Filled 2021-11-01: qty 1

## 2021-11-01 MED ORDER — LEVETIRACETAM IN NACL 500 MG/100ML IV SOLN
500.0000 mg | Freq: Once | INTRAVENOUS | Status: AC
  Administered 2021-11-02: 500 mg via INTRAVENOUS
  Filled 2021-11-01: qty 100

## 2021-11-01 NOTE — ED Provider Notes (Signed)
[facility] DEPT Provider Note   CSN: [id number] Arrival date & time: 11/02/2021  1204     History  Chief complaint: Weakness, decreased appetite.  Melissa Case is a 61 y.o. female.  HPI  Patient has a complicated medical history.  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  Patient was recently admitted to the hospital on May 24.  She was discharged from the hospital on May 31.  Patient presented with complaints of worsening venous stasis ulcers, increasing edema.  Patient was treated for acute on chronic CHF.  She was started on home oxygen.  She was started on oral antibiotics for cellulitis.  Patient had significant weakness and SNF was recommended.  Patient was adamant about home discharge.  EMS was called today because the patient's had nausea decreased appetite and weakness increasing over the last couple of weeks.  In the ED the patient is very weak and somnolent.  She is able to tell me her name.  Home Medications Prior to Admission medications   Medication Sig Start Date End Date Taking? Authorizing Provider  apixaban (ELIQUIS) 5 MG TABS tablet Take 1 tablet (5 mg total) by mouth 2 (two) times daily. 08/12/21   Maryjane Hurter, MD  divalproex (DEPAKOTE) 250 MG DR tablet Take 1 tablet (250 mg total) by mouth 2 (two) times daily. (In addition to '500mg'$  twice daily for a total dosage of '750mg'$  BID). 08/08/21   Alric Ran, MD  divalproex (DEPAKOTE) 500 MG DR tablet Take 1 tablet (500 mg total) by mouth 2 (two) times daily. 08/08/21   Alric Ran, MD  esomeprazole (NEXIUM) 40 MG capsule Take 1 capsule (40 mg total) by mouth 2 (two) times daily before a meal. 08/15/21   Thornton Park, MD  furosemide (LASIX) 40 MG tablet Take 1 tablet (40 mg total) by mouth 2 (two) times daily.  10/07/21 11/06/21  Smoot, Leary Roca, PA-C  levETIRAcetam (KEPPRA) 500 MG tablet Take 1 tablet (500 mg total) by mouth 2 (two) times daily. 08/08/21   Alric Ran, MD  PARoxetine (PAXIL) 40 MG tablet Take 1 tablet (40 mg total) by mouth daily. 09/30/21   Arfeen, Arlyce Harman, MD  potassium chloride SA (KLOR-CON M) 20 MEQ tablet Take 1 tablet (20 mEq total) by mouth 2 (two) times daily. 10/07/21 11/06/21  Smoot, Leary Roca, PA-C  traZODone (DESYREL) 50 MG tablet Take 50 mg by mouth at bedtime as needed for sleep.    [provider]      Allergies    Morphine    Review of Systems   Review of Systems  Physical Exam Updated Vital Signs BP (!) 120/54   Pulse 82   Temp (!) 97.5 F (36.4 C) (Oral)   Resp 19   SpO2 98%  Physical Exam Vitals and nursing note reviewed.  Constitutional:      Appearance: She is well-developed. She is obese. She is ill-appearing.  HENT:     Head: Normocephalic and atraumatic.     Right Ear: Tympanic membrane and external ear normal.     Left Ear: Tympanic membrane and external ear normal.     Mouth/Throat:     Mouth: Mucous membranes are dry.  Eyes:     General: No scleral icterus.       Right eye: No discharge.  Left eye: No discharge.     Conjunctiva/sclera: Conjunctivae normal.  Neck:     Trachea: No tracheal deviation.  Cardiovascular:     Rate and Rhythm: Normal rate and regular rhythm.  Pulmonary:     Effort: Pulmonary effort is normal. No respiratory distress.     Breath sounds: Normal breath sounds. No stridor. No wheezing or rales.  Abdominal:     General: Bowel sounds are normal. There is no distension.     Palpations: Abdomen is soft.     Tenderness: There is no abdominal tenderness. There is no guarding or rebound.  Musculoskeletal:        General: No tenderness or deformity.     Cervical back: Neck supple.     Right lower leg: Edema present.     Left lower leg: Edema present.     Comments: Mild erythema noted bilateral lower extrem  above compression dressings  Skin:    General: Skin is warm and dry.     Findings: No rash.  Neurological:     Mental Status: She is lethargic.     GCS: GCS eye subscore is 3. GCS verbal subscore is 4. GCS motor subscore is 6.     Cranial Nerves: No cranial nerve deficit (no facial droop, ,speech slow).     Sensory: No sensory deficit.     Motor: Weakness present. No abnormal muscle tone or seizure activity.     Comments: Only answers briefly, will tell me her name, not able to provide elaborate history, somnolent, generalized weakness  Psychiatric:        Mood and Affect: Mood normal.     ED Results / Procedures / Treatments   Labs (all labs ordered are listed, but only abnormal results are displayed) Labs Reviewed  COMPREHENSIVE METABOLIC PANEL - Abnormal; Notable for the following components:      Result Value   Potassium 3.0 (*)    Calcium 8.0 (*)    Total Protein 5.2 (*)    Albumin 2.2 (*)    AST 52 (*)    All other components within normal limits  CBC WITH DIFFERENTIAL/PLATELET - Abnormal; Notable for the following components:   MCV 100.7 (*)    RDW 17.0 (*)    Monocytes Absolute 1.2 (*)    Abs Immature Granulocytes 0.38 (*)    All other components within normal limits  URINALYSIS, ROUTINE W REFLEX MICROSCOPIC - Abnormal; Notable for the following components:   Color, Urine AMBER (*)    APPearance CLOUDY (*)    Hgb urine dipstick MODERATE (*)    Bilirubin Urine SMALL (*)    Ketones, ur 20 (*)    Protein, ur 100 (*)    Nitrite POSITIVE (*)    Leukocytes,Ua MODERATE (*)    RBC / HPF >50 (*)    WBC, UA >50 (*)    Bacteria, UA FEW (*)    Squamous Epithelial / LPF >50 (*)    Non Squamous Epithelial 0-5 (*)    All other components within normal limits  AMMONIA - Abnormal; Notable for the following components:   Ammonia 37 (*)    All other components within normal limits  BLOOD GAS, VENOUS - Abnormal; Notable for the following components:   pH, Ven 7.46 (*)     Bicarbonate 38.4 (*)    Acid-Base Excess 12.3 (*)    All other components within normal limits  URINE CULTURE  RAPID URINE DRUG SCREEN, HOSP PERFORMED  VALPROIC ACID LEVEL  AMMONIA  CBG MONITORING, ED    EKG EKG Interpretation  Date/Time:  Tuesday November 01 2021 12:24:06 EDT Ventricular Rate:  90 PR Interval:  147 QRS Duration: 94 QT Interval:  364 QTC Calculation: 446 R Axis:   -16 Text Interpretation: Sinus rhythm Borderline left axis deviation Abnormal R-wave progression, early transition Baseline wander in lead(s) V4 V5 No significant change since last tracing Confirmed by Dorie Rank [phone]) on 10/22/2021 12:44:19 PM  Radiology CT HEAD WO CONTRAST  Result Date: 10/21/2021 CLINICAL DATA:  Mental status change, unknown cause. Additional history provided: Nausea, decreased appetite, weakness, history of brain tumor. EXAM: CT HEAD WITHOUT CONTRAST TECHNIQUE: Contiguous axial images were obtained from the base of the skull through the vertex without intravenous contrast. RADIATION DOSE REDUCTION: This exam was performed according to the departmental dose-optimization program which includes automated exposure control, adjustment of the mA and/or kV according to patient size and/or use of iterative reconstruction technique. COMPARISON:  Prior head CT examinations 08/17/2021 and earlier. Prior brain MRI examinations 04/13/2021 and earlier. FINDINGS: Brain: Mild generalized parenchymal atrophy. Mild patchy and ill-defined hypoattenuation within the cerebral white matter, nonspecific but compatible with chronic small vessel ischemic disease. Redemonstrated focus of encephalomalacia/gliosis and irregular calcification within the mid to anterior right temporal lobe, likely reflecting sequela of prior radiation therapy. Associated ex vacuo dilatation of the right temporal horn. There is no acute intracranial hemorrhage. No acute demarcated cortical infarct. No extra-axial fluid collection. No midline  shift. Vascular: Hyperdense vessel.  Atherosclerotic calcifications. Skull: No fracture or aggressive osseous lesion. Sinuses/Orbits: No mass or acute finding within the imaged orbits. No significant paranasal sinus disease at the imaged levels. Other: Large right middle ear/mastoid effusion. Large left mastoid effusion. IMPRESSION: No evidence of acute intracranial abnormality. Redemonstrated focus of encephalomalacia/gliosis with irregular calcification in the mid-to-anterior right temporal lobe, likely reflecting sequela of prior radiation therapy. Mild chronic small ischemic changes within the cerebral white matter. Mild generalized parenchymal atrophy. Large right middle ear/mastoid effusion. A large left mastoid effusion is also present. Electronically Signed   By: Kellie Simmering D.O.   On: 11/09/2021 13:53   DG Chest Port 1 View  Result Date: 11/16/2021 CLINICAL DATA:  Nausea, decreased appetite, weakness for 5 weeks EXAM: PORTABLE CHEST 1 VIEW COMPARISON:  Chest radiograph 10/12/2021 FINDINGS: Cardiomegaly is unchanged. The upper mediastinal contours are prominent, unchanged. Lung volumes are low. Linear opacities in the lung bases likely reflect subsegmental atelectasis. Otherwise, there is no focal airspace opacity. There is no significant pleural effusion. There is no pneumothorax There is no acute osseous abnormality. IMPRESSION: Low lung volumes with probable bibasilar subsegmental atelectasis. Electronically Signed   By: Valetta Mole M.D.   On: 10/21/2021 12:53    Procedures Procedures    Medications Ordered in ED Medications  cefTRIAXone (ROCEPHIN) 2 g in sodium chloride 0.9 % 100 mL IVPB (2 g Intravenous New Bag/Given 10/20/2021 1531)  sodium chloride 0.9 % bolus 500 mL (0 mLs Intravenous Stopped 10/24/2021 1354)    ED Course/ Medical Decision Making/ A&P Clinical Course as of 11/04/2021 1541  Tue Nov 01, 2021  1521 Rapid urine drug screen (hospital performed) [JK]  1521 CBC WITH  DIFFERENTIAL(!) Normal [JK]  1521 Comprehensive metabolic panel(!) Hypokalemia [JK]  1521 Urinalysis, Routine w reflex microscopic Urine, In & Out Cath(!) Consistent with UTI [JK]    Clinical Course User Index [JK] Dorie Rank, MD  Medical Decision Making Problems Addressed: Acute cystitis without hematuria: complicated acute illness or injury Metabolic encephalopathy: complicated acute illness or injury Middle ear effusion, right: chronic illness or injury Weakness: chronic illness or injury  Amount and/or Complexity of Data Reviewed Labs: ordered. Decision-making details documented in ED Course. Radiology: ordered.  Risk Decision regarding hospitalization.   Patient with multiple medical problems recently in the hospital presents to the ED with complaints of increasing weakness, lethargy, decreased p.o. intake.  His laboratory test did not show any signs of severe dehydration or electrolyte abnormality.  She does have mild hypokalemia.  Patient's CBC is stable.  No evidence of hypercarbia on her VBG.  Valproic acid level is therapeutic.  CT scan shows no abnormalities in the brain but she does have evidence of middle ear effusion on CT scan.  We do not see any signs of erythema swelling physical exam.  Doubt severe infection.  Urinalysis does suggest UTI.  I think this is contributing to overall weakness and decreased p.o. intake.  Patient has multiple comorbidities and is not appropriate for outpatient management.  I spoke with Dr. Marylyn Ishihara regarding admission for further treatment.       Final Clinical Impression(s) / ED Diagnoses Final diagnoses:  Acute cystitis without hematuria  Metabolic encephalopathy  Weakness    Rx / DC Orders ED Discharge Orders     None         Dorie Rank, MD 11/04/2021 [phone]

## 2021-11-01 NOTE — H&P (Signed)
History and Physical    Patient: Melissa Case EXH:[id number] DOB: [birth year] DOA: 10/22/2021 DOS: the patient was seen and examined on 10/31/2021 PCP: Camillia Herter, NP  Patient coming from: Home  Chief Complaint: weakness  HPI: Melissa Case is a 61 y.o. female with medical history significant of parotid gland CA s/p surgery/radiation, seizure disorder, DVT/PE on eliquis, brain mass. Presenting with weakness. History is from husband as the patient is groggy during interview. She was recently in the hospital for acute on chronic diastolic HF. It was recommended that she go to SNF at discharge, but she was determined to go home instead. So she did. She has remained weak while at home and has now developed some nausea and vomiting. She has not wanted to eat. When her husband saw her this morning, she seemed dehydrated. He became concerned and called for EMS.   Review of Systems: Unable to review all systems due to lack of cooperation from patient. Past Medical History:  Diagnosis Date   Cancer ([place])    Partoid   Depression    DVT (deep venous thrombosis) (HCC)    GERD (gastroesophageal reflux disease)    History of radiation to head and neck region 09/16/2019   History IMRT (09/16/19 - 10/27/19) for parotid tumor stage pT3, pN2b.   Panic attack    Seizures ([place])    Past Surgical History:  Procedure Laterality Date   ABDOMINAL HYSTERECTOMY     CHOLECYSTECTOMY N/A 11/21/2017   Procedure: LAPAROSCOPIC CHOLECYSTECTOMY WITH INTRAOPERATIVE CHOLANGIOGRAM;  Surgeon: Jovita Kussmaul, MD;  Location: [facility];  Service: General;  Laterality: N/A;   MULTIPLE EXTRACTIONS WITH ALVEOLOPLASTY N/A 08/28/2019   Procedure: Extraction of tooth #'s 3-7, 10-15, and 20-31 with alveoloplatsy, maxillary right buccal exostosis reduction, and bilateral mandibular tori reductions.;  Surgeon: Lenn Cal, DDS;  Location: [facility];  Service: Oral Surgery;  Laterality: N/A;   PAROTIDECTOMY Right 07/22/2019   with  biopsy; done at [facility] by Dr. Fredricka Bonine   Social History:  reports that she quit smoking about 9 months ago. Her smoking use included cigarettes. She started smoking about 45 years ago. She has a 21.00 pack-year smoking history. She has been exposed to tobacco smoke. She has never used smokeless tobacco. She reports that she does not drink alcohol and does not use drugs.  Allergies  Allergen Reactions   Morphine Itching    Family History  Problem Relation Age of Onset   Depression Sister    Heart disease Mother    Diabetes Father    Breast cancer Paternal Grandmother    Colon cancer Neg Hx    Pancreatic cancer Neg Hx    Esophageal cancer Neg Hx    Stomach cancer Neg Hx    Rectal cancer Neg Hx    Liver cancer Neg Hx     Prior to Admission medications   Medication Sig Start Date End Date Taking? Authorizing Provider  apixaban (ELIQUIS) 5 MG TABS tablet Take 1 tablet (5 mg total) by mouth 2 (two) times daily. 08/12/21  Yes Maryjane Hurter, MD  divalproex (DEPAKOTE) 250 MG DR tablet Take 1 tablet (250 mg total) by mouth 2 (two) times daily. (In addition to '500mg'$  twice daily for a total dosage of '750mg'$  BID). 08/08/21  Yes Alric Ran, MD  divalproex (DEPAKOTE) 500 MG DR tablet Take 1 tablet (500 mg total) by mouth 2 (two) times daily. 08/08/21  Yes Camara, Maryan Puls, MD  esomeprazole (NEXIUM) 40 MG capsule Take 1  capsule (40 mg total) by mouth 2 (two) times daily before a meal. 08/15/21  Yes Thornton Park, MD  furosemide (LASIX) 40 MG tablet Take 1 tablet (40 mg total) by mouth 2 (two) times daily. 10/07/21 11/06/21 Yes Smoot, Leary Roca, PA-C  levETIRAcetam (KEPPRA) 500 MG tablet Take 1 tablet (500 mg total) by mouth 2 (two) times daily. 08/08/21  Yes Camara, Maryan Puls, MD  PARoxetine (PAXIL) 40 MG tablet Take 1 tablet (40 mg total) by mouth daily. 09/30/21  Yes Arfeen, Arlyce Harman, MD  potassium chloride SA (KLOR-CON M) 20 MEQ tablet Take 1 tablet (20 mEq total) by mouth 2 (two) times  daily. 10/07/21 11/06/21 Yes Smoot, Leary Roca, PA-C  traZODone (DESYREL) 50 MG tablet Take 50 mg by mouth at bedtime as needed for sleep.   Yes [provider]    Physical Exam: Vitals:   11/18/2021 1500 11/10/2021 1600 11/11/2021 1630 11/13/2021 1700  BP:  (!) 84/58 93/74 117/81  Pulse: 82 86 84 87  Resp: '19 17 16   '$ Temp:      TempSrc:      SpO2: 98% 95% 97% 100%   General: 61 y.o. ill appearing female resting in bed in NAD Eyes: PERRL, normal sclera ENMT: Nares patent w/o discharge, orophaynx clear, dentition normal, ears w/o discharge/lesions/ulcers Neck: Supple, trachea midline Cardiovascular: RRR, +S1, S2, no m/g/r, equal pulses throughout Respiratory: CTABL, no w/r/r, normal WOB GI: BS+, NDNT, no masses noted, no organomegaly noted MSK: No c/c; BLE edema Skin: No rashes, bruises, ulcerations noted Neuro: A&O x 3 but slow to answer, following limited commands Psyc: she is groggy but cooperative  Data Reviewed:  Na+  141 K+  3.0 BUN  7 Scr  0.57 Albumin  2.2 AST  52 ALT  31 Ammonia  37 WBC  6.1 Hgb  13.1 MCV  100.7 UDS negative  CT head No evidence of acute intracranial abnormality. Redemonstrated focus of encephalomalacia/gliosis with irregular calcification in the mid-to-anterior right temporal lobe, likely reflecting sequela of prior radiation therapy. Mild chronic small ischemic changes within the cerebral white matter. Mild generalized parenchymal atrophy. Large right middle ear/mastoid effusion. A large left mastoid effusion is also present.  CXR:  Low lung volumes with probable bibasilar subsegmental atelectasis.  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     - continue home regimen when confirmed     - continue outpt follow up  DVT/PE     - continue home regimen  Chronic diastolic HF     - hold lasix today d/t hypotension; reassess in AM  Depression     - continue home regimen  Morbid obesity     - counsel on diet, lifestyle changes  Generalized weakness     - at previous discharge, SNF was recommended     - PT/OT eval as mentation improves; SNF remains appropriate  Advance Care Planning:   Code Status: FULL  Consults: None  Family Communication: w/ husband by phone  Severity of Illness: The appropriate patient  status for this patient is INPATIENT. Inpatient status is judged to be reasonable and necessary in order to provide the required intensity of service to ensure the patient's safety. The patient's presenting symptoms, physical exam findings, and initial radiographic and laboratory data in the context of their chronic comorbidities is felt to place them at high risk for further clinical deterioration. Furthermore, it is not anticipated that the patient will be medically stable for discharge from the hospital within 2 midnights of admission.   * I certify that at the point of admission it is my clinical judgment that the patient will require inpatient hospital care spanning beyond 2 midnights from the point of admission due to high intensity of service, high risk for further deterioration and high frequency of surveillance required.*  Author: Jonnie Finner, DO 11/16/2021 5:27 PM  For on call review [email or web address].

## 2021-11-01 NOTE — ED Triage Notes (Signed)
BIBA EMS:  Per EMS: Pt coming from home w/ c/o nausea, decreased appetite, weakness x 5 weeks. Hx brain tumor. Hx ride side defects. 120 HR by fire.  HR 90  20 RR 99% 2 L 99 CBG  22 L forearm 200 cc given en route

## 2021-11-02 ENCOUNTER — Inpatient Hospital Stay (HOSPITAL_COMMUNITY)
Admit: 2021-11-02 | Discharge: 2021-11-02 | Disposition: A | Discharge status: Home / Self Care | Payer: BC Managed Care – PPO | Attending: Internal Medicine | Admitting: Internal Medicine

## 2021-11-02 DIAGNOSIS — N3000 Acute cystitis without hematuria: Secondary | ICD-10-CM | POA: Diagnosis not present

## 2021-11-02 DIAGNOSIS — G9341 Metabolic encephalopathy: Secondary | ICD-10-CM

## 2021-11-02 LAB — COMPREHENSIVE METABOLIC PANEL
ALT: 29 U/L (ref 0–44)
AST: 49 U/L — ABNORMAL HIGH (ref 15–41)
Albumin: 2.3 g/dL — ABNORMAL LOW (ref 3.5–5.0)
Alkaline Phosphatase: 95 U/L (ref 38–126)
Anion gap: 10 (ref 5–15)
BUN: 8 mg/dL (ref 6–20)
CO2: 33 mmol/L — ABNORMAL HIGH (ref 22–32)
Calcium: 8.6 mg/dL — ABNORMAL LOW (ref 8.9–10.3)
Chloride: 102 mmol/L (ref 98–111)
Creatinine, Ser: 0.51 mg/dL (ref 0.44–1.00)
GFR, Estimated: >60 mL/min (ref >60)
Glucose, Bld: 86 mg/dL (ref 70–99)
Potassium: 2.7 mmol/L — CL (ref 3.5–5.1)
Sodium: 145 mmol/L (ref 135–145)
Total Bilirubin: 0.8 mg/dL (ref 0.3–1.2)
Total Protein: 5.2 g/dL — ABNORMAL LOW (ref 6.5–8.1)

## 2021-11-02 LAB — BLOOD GAS, ARTERIAL
Acid-Base Excess: 12.7 mmol/L — ABNORMAL HIGH (ref 0.0–2.0)
Bicarbonate: 37.5 mmol/L — ABNORMAL HIGH (ref 20.0–28.0)
Drawn by: 54496
FIO2: 28 %
O2 Content: 2 L/min
O2 Saturation: 98.3 %
Patient temperature: 37
pCO2 arterial: 47 mmHg (ref 32–48)
pH, Arterial: 7.51 — ABNORMAL HIGH (ref 7.35–7.45)
pO2, Arterial: 86 mmHg (ref 83–108)

## 2021-11-02 LAB — CBC
HCT: 37.8 % (ref 36.0–46.0)
Hemoglobin: 12 g/dL (ref 12.0–15.0)
MCH: 32.1 pg (ref 26.0–34.0)
MCHC: 31.7 g/dL (ref 30.0–36.0)
MCV: 101.1 fL — ABNORMAL HIGH (ref 80.0–100.0)
Platelets: 228 10*3/uL (ref 150–400)
RBC: 3.74 MIL/uL — ABNORMAL LOW (ref 3.87–5.11)
RDW: 17.2 % — ABNORMAL HIGH (ref 11.5–15.5)
WBC: 6.7 10*3/uL (ref 4.0–10.5)
nRBC: 0 % (ref 0.0–0.2)

## 2021-11-02 LAB — AMMONIA: Ammonia: 25 umol/L (ref 9–35)

## 2021-11-02 LAB — VALPROIC ACID LEVEL: Valproic Acid Lvl: 53 ug/mL (ref 50.0–100.0)

## 2021-11-02 LAB — VITAMIN B12: Vitamin B-12: 1386 pg/mL — ABNORMAL HIGH (ref 180–914)

## 2021-11-02 LAB — FOLATE: Folate: 8.9 ng/mL (ref >5.9)

## 2021-11-02 MED ORDER — PANTOPRAZOLE SODIUM 40 MG IV SOLR
40.0000 mg | Freq: Two times a day (BID) | INTRAVENOUS | Status: DC
  Administered 2021-11-02 – 2021-11-12 (×22): 40 mg via INTRAVENOUS
  Filled 2021-11-02 (×22): qty 10

## 2021-11-02 MED ORDER — POTASSIUM CHLORIDE 10 MEQ/100ML IV SOLN
10.0000 meq | INTRAVENOUS | Status: AC
  Administered 2021-11-02 (×6): 10 meq via INTRAVENOUS
  Filled 2021-11-02 (×6): qty 100

## 2021-11-02 MED ORDER — VALPROATE SODIUM 100 MG/ML IV SOLN
500.0000 mg | Freq: Three times a day (TID) | INTRAVENOUS | Status: DC
  Administered 2021-11-02 – 2021-11-08 (×20): 500 mg via INTRAVENOUS
  Filled 2021-11-02 (×24): qty 5

## 2021-11-02 MED ORDER — LEVETIRACETAM IN NACL 500 MG/100ML IV SOLN
500.0000 mg | Freq: Two times a day (BID) | INTRAVENOUS | Status: DC
  Administered 2021-11-02 – 2021-11-07 (×12): 500 mg via INTRAVENOUS
  Filled 2021-11-02 (×13): qty 100

## 2021-11-02 MED ORDER — ENOXAPARIN SODIUM 100 MG/ML IJ SOSY
100.0000 mg | PREFILLED_SYRINGE | Freq: Two times a day (BID) | INTRAMUSCULAR | Status: DC
  Administered 2021-11-02 – 2021-11-12 (×21): 100 mg via SUBCUTANEOUS
  Filled 2021-11-02 (×22): qty 1

## 2021-11-02 NOTE — Progress Notes (Signed)
EEG complete - results pending 

## 2021-11-02 NOTE — Procedures (Addendum)
Patient Name: Eniya Cannady  MRN: [id number]  Epilepsy Attending: Lora Havens  Referring Physician/Provider: Georgette Shell, MD  Date: 11/02/2021 Duration: 26.47 mins  Patient history: 61yo F with h/o seizure now with ams. EEG to evaluate for seizure  Level of alertness: Awake  AEDs during EEG study: LEV, VPA  Technical aspects: This EEG study was done with scalp electrodes positioned according to the 10-20 International system of electrode placement. Electrical activity was acquired at a sampling rate of '500Hz'$  and reviewed with a high frequency filter of '70Hz'$  and a low frequency filter of '1Hz'$ . EEG data were recorded continuously and digitally stored.   Description: The posterior dominant rhythm consists of 8 Hz activity of moderate voltage (25-35 uV) seen predominantly in posterior head regions, symmetric and reactive to eye opening and eye closing. EEG showed intermittent generalized 3 to 6 Hz theta-delta slowing. Hyperventilation and photic stimulation were not performed.     ABNORMALITY - Intermittent slow, generalized  IMPRESSION: This study is suggestive of mild diffuse encephalopathy, nonspecific etiology. No seizures or epileptiform discharges were seen throughout the recording.  Ryder Man [name]

## 2021-11-02 NOTE — Progress Notes (Signed)
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  According to her husband she has been confused not responding appropriately not acting as usual.  At baseline she is able to walk around do things on her own.  Felt that she was not eating or drinking and was dehydrated.  Patient had nausea vomiting and confusion at the time of admission.  This was thought to be due to urinary tract infection. She is on Keppra and Depakote for seizures.  Per husband her last seizure was in October 2022. She does have chronic venous stasis ulcers.  She was admitted to the hospital last month and discharged 10/12/2021 with acute on chronic diastolic CHF. She is on ns 75 cc hr was on lasix at home 40 mg bid  Assessment & Plan:   Principal Problem:   UTI (urinary tract infection) Active Problems:   Generalized weakness   Cancer of parotid gland (HCC)   History of pulmonary embolus (PE)   Depression   Seizure (HCC)   DVT (deep venous thrombosis) (HCC)   Acute metabolic encephalopathy   Chronic diastolic CHF (congestive heart failure) (HCC)   Obesity, Class III, BMI 40-49.9 (morbid obesity) (HCC)   Nausea and vomiting   Hypokalemia   #1 acute encephalopathy which is probably multifactorial-UTI/dehydration- Her UA was positive for leukocytes and nitrites with few bacteria and more than 50 WBCs.  She was started on Rocephin. Urine culture and blood cultures pending negative so far. UA is also positive for ketones, on Lasix at home. Normal saline 75 cc an hour ongoing since admission. Patient has not improved with the above  treatments. Ammonia level 37 ABG pending Eeg ordered ABG on admission 7.4 6/54/44 Depakote level pending CT head shows changes consistent with postradiation but nothing acute. We will discuss with neurology  #2 nausea vomiting seems to have resolved however she is not safe enough to take p.o. yet.  #3 hypokalemia replete IV Mag 1.9  #4 history of seizure disorder per husband her last seizure was in October 2022.  Patient has history of brain mets from parotid gland malignancy.  Continue Keppra and Depakote IV.  #5 history of DVT and PE unable to take Eliquis p.o. due to change in mental status, we will change to Lovenox  #6 chronic diastolic heart failure on Lasix at home which has been on hold due to no p.o. intake and dehydration on NS 75 cc an hour monitor closely  #7 depression Home meds on hold  #8 chronic venous stasis dermatitis and ulcers we will consult wound care     Estimated body mass index is 38.98 kg/m as calculated from the following:   Height as of this encounter: '5\' 4"'$  (1.626 m).   Weight as of this encounter: 103 kg.  DVT prophylaxis: Lovenox Code Status: Full code Family Communication: Discussed with husband at bedside Disposition Plan:  Status is: Inpatient    Consultants:  Neurology  Procedures: None  antimicrobials: Rocephin  Subjective: Husband by the bedside Patient restless confused having jerking movements does not respond to commands or voice With sternal rub she was able to open her eyes and look at  me Moves all her extremities voluntarily  Objective: Vitals:   11/11/2021 2305 11/02/21 0237 11/02/21 0445 11/02/21 0756  BP: (!) 120/104 (!) 136/102  109/72  Pulse: 86 91  87  Resp: 18 20    Temp: 98.7 F (37.1 C) 98.8 F (37.1 C)  98.8 F (37.1 C)  TempSrc: Axillary   Oral  SpO2: 98% 97%  94%  Weight: 100.5 kg  103 kg   Height: '5\' 4"'$  (1.626 m)       Intake/Output Summary (Last 24 hours) at 11/02/2021 1023 Last data filed at  11/02/2021 0645 Gross per 24 hour  Intake 1411.27 ml  Output --  Net 1411.27 ml   Filed Weights   10/22/2021 2305 11/02/21 0445  Weight: 100.5 kg 103 kg    Examination:  General exam: Appears chronically ill-appearing restless confused respiratory system: Clear to auscultation. Respiratory effort normal. Cardiovascular system: S1 & S2 heard, RRR. No JVD, murmurs, rubs, gallops or clicks. No pedal edema. Gastrointestinal system: Abdomen is nondistended, soft and nontender. No organomegaly or masses felt. Normal bowel sounds heard. Central nervous system: Restless confused moves all extremities  extremities: No edema Skin: Chronic venous stasis ulcers Psychiatry: Unable to assess   Data Reviewed: I have personally reviewed following labs and imaging studies  CBC: Recent Labs  Lab 11/04/2021 1225 11/02/21 0423  WBC 6.1 6.7  NEUTROABS 3.5  --   HGB 13.1 12.0  HCT 42.0 37.8  MCV 100.7* 101.1*  PLT 250 517   Basic Metabolic Panel: Recent Labs  Lab 10/23/2021 1225 11/02/21 0423  NA 141 145  K 3.0* 2.7*  CL 98 102  CO2 32 33*  GLUCOSE 92 86  BUN 7 8  CREATININE 0.57 0.51  CALCIUM 8.0* 8.6*   GFR: Estimated Creatinine Clearance: 87.4 mL/min (by C-G formula based on SCr of 0.51 mg/dL). Liver Function Tests: Recent Labs  Lab 11/18/2021 1225 11/02/21 0423  AST 52* 49*  ALT 31 29  ALKPHOS 95 95  BILITOT 0.7 0.8  PROT 5.2* 5.2*  ALBUMIN 2.2* 2.3*   No results for input(s): "LIPASE", "AMYLASE" in the last 168 hours. Recent Labs  Lab 11/10/2021 1225  AMMONIA 37*   Coagulation Profile: No results for input(s): "INR", "PROTIME" in the last 168 hours. Cardiac Enzymes: No results for input(s): "CKTOTAL", "CKMB", "CKMBINDEX", "TROPONINI" in the last 168 hours. BNP (last 3 results) Recent Labs    08/12/21 1009  PROBNP 28.0   HbA1C: No results for input(s): "HGBA1C" in the last 72 hours. CBG: Recent Labs  Lab 10/23/2021 1238  GLUCAP 91   Lipid Profile: No results  for input(s): "CHOL", "HDL", "LDLCALC", "TRIG", "CHOLHDL", "LDLDIRECT" in the last 72 hours. Thyroid Function Tests: No results for input(s): "TSH", "T4TOTAL", "FREET4", "T3FREE", "THYROIDAB" in the last 72 hours. Anemia Panel: No results for input(s): "VITAMINB12", "FOLATE", "FERRITIN", "TIBC", "IRON", "RETICCTPCT" in the last 72 hours. Sepsis Labs: No results for input(s): "PROCALCITON", "LATICACIDVEN" in the last 168 hours.  No results found for this or any previous visit (from the past 240 hour(s)).       Radiology Studies: CT HEAD WO CONTRAST  Result Date: 10/20/2021 CLINICAL DATA:  Mental status change, unknown cause. Additional history provided: Nausea, decreased appetite, weakness, history of brain tumor. EXAM: CT HEAD WITHOUT CONTRAST TECHNIQUE: Contiguous axial images were obtained from the base of the skull through the vertex without intravenous contrast. RADIATION DOSE REDUCTION: This exam was performed according to the departmental dose-optimization program which includes automated exposure control,  adjustment of the mA and/or kV according to patient size and/or use of iterative reconstruction technique. COMPARISON:  Prior head CT examinations 08/17/2021 and earlier. Prior brain MRI examinations 04/13/2021 and earlier. FINDINGS: Brain: Mild generalized parenchymal atrophy. Mild patchy and ill-defined hypoattenuation within the cerebral white matter, nonspecific but compatible with chronic small vessel ischemic disease. Redemonstrated focus of encephalomalacia/gliosis and irregular calcification within the mid to anterior right temporal lobe, likely reflecting sequela of prior radiation therapy. Associated ex vacuo dilatation of the right temporal horn. There is no acute intracranial hemorrhage. No acute demarcated cortical infarct. No extra-axial fluid collection. No midline shift. Vascular: Hyperdense vessel.  Atherosclerotic calcifications. Skull: No fracture or aggressive osseous  lesion. Sinuses/Orbits: No mass or acute finding within the imaged orbits. No significant paranasal sinus disease at the imaged levels. Other: Large right middle ear/mastoid effusion. Large left mastoid effusion. IMPRESSION: No evidence of acute intracranial abnormality. Redemonstrated focus of encephalomalacia/gliosis with irregular calcification in the mid-to-anterior right temporal lobe, likely reflecting sequela of prior radiation therapy. Mild chronic small ischemic changes within the cerebral white matter. Mild generalized parenchymal atrophy. Large right middle ear/mastoid effusion. A large left mastoid effusion is also present. Electronically Signed   By: Kellie Simmering D.O.   On: 11/11/2021 13:53   DG Chest Port 1 View  Result Date: 11/18/2021 CLINICAL DATA:  Nausea, decreased appetite, weakness for 5 weeks EXAM: PORTABLE CHEST 1 VIEW COMPARISON:  Chest radiograph 10/12/2021 FINDINGS: Cardiomegaly is unchanged. The upper mediastinal contours are prominent, unchanged. Lung volumes are low. Linear opacities in the lung bases likely reflect subsegmental atelectasis. Otherwise, there is no focal airspace opacity. There is no significant pleural effusion. There is no pneumothorax There is no acute osseous abnormality. IMPRESSION: Low lung volumes with probable bibasilar subsegmental atelectasis. Electronically Signed   By: Valetta Mole M.D.   On: 11/05/2021 12:53        Scheduled Meds:  apixaban  5 mg Oral BID   divalproex  250 mg Oral BID   divalproex  500 mg Oral BID   pantoprazole  80 mg Oral Q1200   PARoxetine  40 mg Oral Daily   Continuous Infusions:  sodium chloride 75 mL/hr at 11/02/21 0645   cefTRIAXone (ROCEPHIN)  IV 2 g (11/02/21 0836)   levETIRAcetam     potassium chloride 10 mEq (11/02/21 0947)     LOS: 1 day    Time spent: 30 min  Georgette Shell, MD  11/02/2021, 10:23 AM

## 2021-11-03 ENCOUNTER — Encounter (HOSPITAL_BASED_OUTPATIENT_CLINIC_OR_DEPARTMENT_OTHER): Payer: BC Managed Care – PPO | Admitting: Internal Medicine

## 2021-11-03 DIAGNOSIS — N3000 Acute cystitis without hematuria: Secondary | ICD-10-CM | POA: Diagnosis not present

## 2021-11-03 LAB — CBC
HCT: 35.2 % — ABNORMAL LOW (ref 36.0–46.0)
Hemoglobin: 10.7 g/dL — ABNORMAL LOW (ref 12.0–15.0)
MCH: 31.6 pg (ref 26.0–34.0)
MCHC: 30.4 g/dL (ref 30.0–36.0)
MCV: 103.8 fL — ABNORMAL HIGH (ref 80.0–100.0)
Platelets: 201 10*3/uL (ref 150–400)
RBC: 3.39 MIL/uL — ABNORMAL LOW (ref 3.87–5.11)
RDW: 17.8 % — ABNORMAL HIGH (ref 11.5–15.5)
WBC: 6.3 10*3/uL (ref 4.0–10.5)
nRBC: 0.3 % — ABNORMAL HIGH (ref 0.0–0.2)

## 2021-11-03 LAB — URINE CULTURE: Culture: >=100000 — AB

## 2021-11-03 LAB — COMPREHENSIVE METABOLIC PANEL
ALT: 25 U/L (ref 0–44)
AST: 43 U/L — ABNORMAL HIGH (ref 15–41)
Albumin: 1.9 g/dL — ABNORMAL LOW (ref 3.5–5.0)
Alkaline Phosphatase: 79 U/L (ref 38–126)
Anion gap: 9 (ref 5–15)
BUN: <5 mg/dL — ABNORMAL LOW (ref 6–20)
CO2: 30 mmol/L (ref 22–32)
Calcium: 8.1 mg/dL — ABNORMAL LOW (ref 8.9–10.3)
Chloride: 108 mmol/L (ref 98–111)
Creatinine, Ser: 0.5 mg/dL (ref 0.44–1.00)
GFR, Estimated: >60 mL/min (ref >60)
Glucose, Bld: 71 mg/dL (ref 70–99)
Potassium: 3.1 mmol/L — ABNORMAL LOW (ref 3.5–5.1)
Sodium: 147 mmol/L — ABNORMAL HIGH (ref 135–145)
Total Bilirubin: 0.8 mg/dL (ref 0.3–1.2)
Total Protein: 4.6 g/dL — ABNORMAL LOW (ref 6.5–8.1)

## 2021-11-03 LAB — MAGNESIUM
Magnesium: 2 mg/dL (ref 1.7–2.4)
Magnesium: 2 mg/dL (ref 1.7–2.4)

## 2021-11-03 MED ORDER — SODIUM CHLORIDE 0.9 % IV SOLN
2.0000 g | Freq: Three times a day (TID) | INTRAVENOUS | Status: DC
  Administered 2021-11-03 – 2021-11-04 (×3): 2 g via INTRAVENOUS
  Filled 2021-11-03 (×3): qty 12.5

## 2021-11-03 MED ORDER — DEXTROSE 5 % IV SOLN: INTRAVENOUS | Status: DC

## 2021-11-03 NOTE — Progress Notes (Signed)
Orthopedic Tech Progress Note Patient Details:  Melissa Case 1960/10/23 [id number]  Patient ID: Melissa Case, female   DOB: [birth year], 61 y.o.   MRN: [id number]  [name] 11/03/2021, 2:26 PM Bi lat unna boot applied

## 2021-11-03 NOTE — Consult Note (Signed)
[facility] Nurse Consult Note: Reason for Consult:LE wounds No wounds on the bilateral LEs, Unna's boots being used for edema control   Dressing procedure/placement/frequency: Unna's boots for compression therapy, change 1 x week on Q Thurs. Orthopedic tech's to manage.   Patient to follow up with MD office for change or resume HHRN for change.   Discussed POC with patient and bedside nurse.  Re consult if needed, will not follow at this time. Thanks  Yulia Ulrich [employer], RN,CWOCN, CNS, [place] [phone])

## 2021-11-03 NOTE — Progress Notes (Signed)
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  According to her husband she has been confused not responding appropriately not acting as usual.  At baseline she is able to walk around do things on her own.  Felt that she was not eating or drinking and was dehydrated.  Patient had nausea vomiting and confusion at the time of admission.  This was thought to be due to urinary tract infection. She is on Keppra and Depakote for seizures.  Per husband her last seizure was in October 2022. She does have chronic venous stasis ulcers.  She was admitted to the hospital last month and discharged 10/12/2021 with acute on chronic diastolic CHF. She is on ns 75 cc hr was on lasix at home 40 mg bid  Assessment & Plan:   Principal Problem:   UTI (urinary tract infection) Active Problems:   Generalized weakness   Cancer of parotid gland (HCC)   History of pulmonary embolus (PE)   Depression   Seizure (HCC)   DVT (deep venous thrombosis) (HCC)   Acute metabolic encephalopathy   Chronic diastolic CHF (congestive heart failure) (HCC)   Obesity, Class III, BMI 40-49.9 (morbid obesity) (HCC)   Nausea and vomiting   Hypokalemia   #1 acute encephalopathy which is probably multifactorial-UTI/dehydration- Her UA was positive for leukocytes and nitrites with few bacteria and more than 50 WBCs.  She was started on Rocephin. Urine culture with gram-negative rods resistant to Rocephin and sensitive to cefepime.  Cefepime started 11/03/2021.  And blood cultures pending negative so far. UA is also positive for ketones, on Lasix at home. Change  fluids to D5 50 cc an hour ongoing since admission. Patient has not improved with the above treatments. Ammonia level 37 Eeg no active seizures  ABG on admission 7.4 6/54/44 Depakote level normal CT head shows changes consistent with postradiation but nothing acute.  #2 nausea vomiting -resolved start clear liquid diet.    #3 hypokalemia replete IV Mag 1.9  #4 history of seizure disorder per husband her last seizure was in October 2022.  Patient has history of brain mets from parotid gland malignancy.  Continue Keppra and Depakote IV.  #5 history of DVT and PE unable to take Eliquis p.o. due to change in mental status, we will change to Lovenox  #6 chronic diastolic heart failure on Lasix at home which has been on hold due to no p.o. intake and dehydration on ivf 50 cc an hour monitor closely  #7 depression Home meds on hold  #8 chronic venous stasis dermatitis seen by wound care no evidence of open wounds     Estimated body mass index is 39.05 kg/m as calculated from the following:   Height as of this encounter: '5\' 4"'$  (1.626 m).   Weight as of this encounter: 103.2 kg.  DVT prophylaxis: Lovenox Code Status: Full code Family Communication: Discussed with husband at bedside Disposition Plan:  Status is: Inpatient    Consultants:  Neurology  Procedures: None  antimicrobials: Rocephin  Subjective: Husband by the bedside patient is resting she is still drowsy but more awake than yesterday able to answer some simple questions  appropriately and moves all extremities Objective: Vitals:   11/02/21 1239 11/03/21 0434 11/03/21 0500 11/03/21 1229  BP: 121/67 123/72  (!) 104/49  Pulse: 89 93  80  Resp:  20  18  Temp: 98.4 F (36.9 C) 99 F (37.2 C)  98.3 F (36.8 C)  TempSrc: Oral Oral  Oral  SpO2: 95% 95%  99%  Weight:   103.2 kg   Height:        Intake/Output Summary (Last 24 hours) at 11/03/2021 1333 Last data filed at 11/03/2021 0000 Gross per 24 hour  Intake  2323.37 ml  Output 200 ml  Net 2123.37 ml    Filed Weights   11/05/2021 2305 11/02/21 0445 11/03/21 0500  Weight: 100.5 kg 103 kg 103.2 kg    Examination:  General exam: Appears chronically ill-appearing restless confused  respiratory system: Clear to auscultation. Respiratory effort normal. Cardiovascular system: S1 & S2 heard, RRR. No JVD, murmurs, rubs, gallops or clicks. No pedal edema. Gastrointestinal system: Abdomen is nondistended, soft and nontender. No organomegaly or masses felt. Normal bowel sounds heard. Central nervous system: Restless confused moves all extremities  extremities: No edema Skin: Chronic venous stasis ulcers Psychiatry: Unable to assess   Data Reviewed: I have personally reviewed following labs and imaging studies  CBC: Recent Labs  Lab 11/13/2021 1225 11/02/21 0423 11/03/21 0413  WBC 6.1 6.7 6.3  NEUTROABS 3.5  --   --   HGB 13.1 12.0 10.7*  HCT 42.0 37.8 35.2*  MCV 100.7* 101.1* 103.8*  PLT 250 228 161    Basic Metabolic Panel: Recent Labs  Lab 10/28/2021 1225 11/02/21 0423 11/03/21 0413 11/03/21 1024  NA 141 145 147*  --   K 3.0* 2.7* 3.1*  --   CL 98 102 108  --   CO2 32 33* 30  --   GLUCOSE 92 86 71  --   BUN 7 8 <5*  --   CREATININE 0.57 0.51 0.50  --   CALCIUM 8.0* 8.6* 8.1*  --   MG  --   --  2.0 2.0    GFR: Estimated Creatinine Clearance: 87.5 mL/min (by C-G formula based on SCr of 0.5 mg/dL). Liver Function Tests: Recent Labs  Lab 10/24/2021 1225 11/02/21 0423 11/03/21 0413  AST 52* 49* 43*  ALT '31 29 25  '$ ALKPHOS 95 95 79  BILITOT 0.7 0.8 0.8  PROT 5.2* 5.2* 4.6*  ALBUMIN 2.2* 2.3* 1.9*    No results for input(s): "LIPASE", "AMYLASE" in the last 168 hours. Recent Labs  Lab 10/30/2021 1225 11/02/21 1033  AMMONIA 37* 25    Coagulation Profile: No results for input(s): "INR", "PROTIME" in the last 168 hours. Cardiac Enzymes: No results for input(s): "CKTOTAL", "CKMB", "CKMBINDEX", "TROPONINI" in the last 168  hours. BNP (last 3 results) Recent Labs    08/12/21 1009  PROBNP 28.0    HbA1C: No results for input(s): "HGBA1C" in the last 72 hours. CBG: Recent Labs  Lab 11/07/2021 1238  GLUCAP 91    Lipid Profile: No results for input(s): "CHOL", "HDL", "LDLCALC", "TRIG", "CHOLHDL", "LDLDIRECT" in the last 72 hours. Thyroid Function Tests: No results for input(s): "TSH", "T4TOTAL", "FREET4", "T3FREE", "THYROIDAB" in the last 72 hours. Anemia Panel: Recent Labs    11/02/21 1127  VITAMINB12 1,386*  FOLATE 8.9   Sepsis Labs: No results for input(s): "PROCALCITON", "LATICACIDVEN" in the last 168 hours.  Recent Results (from the past 240 hour(s))  Urine Culture     Status: Abnormal  Collection Time: 11/09/2021  3:23 PM   Specimen: In/Out Cath Urine  Result Value Ref Range Status   Specimen Description   Final    IN/OUT CATH URINE Performed at [facility], [place] [address] [place], [place] [address]    Special Requests   Final    NONE Performed at [facility], [place] [address] [place], [place] [address]    Culture >=100,000 COLONIES/mL ENTEROBACTER AEROGENES (A)  Final   Report Status 11/03/2021 FINAL  Final   Organism ID, Bacteria ENTEROBACTER AEROGENES (A)  Final      Susceptibility   Enterobacter aerogenes - MIC*    CEFAZOLIN >=64 RESISTANT Resistant     CEFEPIME 1 SENSITIVE Sensitive     CEFTRIAXONE >=64 RESISTANT Resistant     CIPROFLOXACIN <=0.25 SENSITIVE Sensitive     GENTAMICIN <=1 SENSITIVE Sensitive     IMIPENEM 1 SENSITIVE Sensitive     NITROFURANTOIN 128 RESISTANT Resistant     TRIMETH/SULFA <=20 SENSITIVE Sensitive     PIP/TAZO >=128 RESISTANT Resistant     * >=100,000 COLONIES/mL ENTEROBACTER AEROGENES         Radiology Studies: EEG adult  Result Date: 22-Nov-2021 Lora Havens, MD     22-Nov-2021  3:08 PM Patient Name: Sherran Margolis MRN: [id number] Epilepsy Attending: Lora Havens Referring Physician/Provider:  Georgette Shell, MD Date: Nov 22, 2021 Duration: 26.47 mins Patient history: 61yo F with h/o seizure now with ams. EEG to evaluate for seizure Level of alertness: Awake AEDs during EEG study: LEV, VPA Technical aspects: This EEG study was done with scalp electrodes positioned according to the 10-20 International system of electrode placement. Electrical activity was acquired at a sampling rate of '500Hz'$  and reviewed with a high frequency filter of '70Hz'$  and a low frequency filter of '1Hz'$ . EEG data were recorded continuously and digitally stored. Description: The posterior dominant rhythm consists of 8 Hz activity of moderate voltage (25-35 uV) seen predominantly in posterior head regions, symmetric and reactive to eye opening and eye closing. EEG showed intermittent generalized 3 to 6 Hz theta-delta slowing. Hyperventilation and photic stimulation were not performed.   ABNORMALITY - Intermittent slow, generalized IMPRESSION: This study is suggestive of mild diffuse encephalopathy, nonspecific etiology. No seizures or epileptiform discharges were seen throughout the recording. Priyanka [name]   CT HEAD WO CONTRAST  Result Date: 11/07/2021 CLINICAL DATA:  Mental status change, unknown cause. Additional history provided: Nausea, decreased appetite, weakness, history of brain tumor. EXAM: CT HEAD WITHOUT CONTRAST TECHNIQUE: Contiguous axial images were obtained from the base of the skull through the vertex without intravenous contrast. RADIATION DOSE REDUCTION: This exam was performed according to the departmental dose-optimization program which includes automated exposure control, adjustment of the mA and/or kV according to patient size and/or use of iterative reconstruction technique. COMPARISON:  Prior head CT examinations 08/17/2021 and earlier. Prior brain MRI examinations 04/13/2021 and earlier. FINDINGS: Brain: Mild generalized parenchymal atrophy. Mild patchy and ill-defined hypoattenuation within the  cerebral white matter, nonspecific but compatible with chronic small vessel ischemic disease. Redemonstrated focus of encephalomalacia/gliosis and irregular calcification within the mid to anterior right temporal lobe, likely reflecting sequela of prior radiation therapy. Associated ex vacuo dilatation of the right temporal horn. There is no acute intracranial hemorrhage. No acute demarcated cortical infarct. No extra-axial fluid collection. No midline shift. Vascular: Hyperdense vessel.  Atherosclerotic calcifications. Skull: No fracture or aggressive osseous lesion. Sinuses/Orbits: No mass or acute finding within the imaged orbits. No significant  paranasal sinus disease at the imaged levels. Other: Large right middle ear/mastoid effusion. Large left mastoid effusion. IMPRESSION: No evidence of acute intracranial abnormality. Redemonstrated focus of encephalomalacia/gliosis with irregular calcification in the mid-to-anterior right temporal lobe, likely reflecting sequela of prior radiation therapy. Mild chronic small ischemic changes within the cerebral white matter. Mild generalized parenchymal atrophy. Large right middle ear/mastoid effusion. A large left mastoid effusion is also present. Electronically Signed   By: Kellie Simmering D.O.   On: 11/02/2021 13:53        Scheduled Meds:  enoxaparin (LOVENOX) injection  100 mg Subcutaneous BID   pantoprazole (PROTONIX) IV  40 mg Intravenous Q12H   PARoxetine  40 mg Oral Daily   Continuous Infusions:  sodium chloride 75 mL/hr at 11/03/21 1010   ceFEPime (MAXIPIME) IV     levETIRAcetam 500 mg (11/03/21 0921)   valproate sodium 500 mg (11/03/21 1009)     LOS: 2 days    Time spent: 83 min  Georgette Shell, MD  11/03/2021, 1:33 PM

## 2021-11-03 NOTE — Progress Notes (Signed)
PT Cancellation Note  Patient Details Name: Jearldine Cassady MRN: [id number] DOB: 31-Jul-1960   Cancelled Treatment:    Reason Eval/Treat Not Completed: Patient at procedure or test/unavailable Ortho tech arrived to wrap UNNA boots.   Myrtis Hopping [place] 11/03/2021, 2:13 PM Arlyce Dice, DPT Acute Rehabilitation Services Pager: [phone] Office: [phone]

## 2021-11-04 ENCOUNTER — Ambulatory Visit (HOSPITAL_BASED_OUTPATIENT_CLINIC_OR_DEPARTMENT_OTHER): Payer: BC Managed Care – PPO | Admitting: General Surgery

## 2021-11-04 ENCOUNTER — Encounter (HOSPITAL_COMMUNITY): Payer: Self-pay | Admitting: Internal Medicine

## 2021-11-04 ENCOUNTER — Inpatient Hospital Stay (HOSPITAL_COMMUNITY): Payer: BC Managed Care – PPO

## 2021-11-04 DIAGNOSIS — N3000 Acute cystitis without hematuria: Secondary | ICD-10-CM | POA: Diagnosis not present

## 2021-11-04 DIAGNOSIS — R410 Disorientation, unspecified: Secondary | ICD-10-CM | POA: Diagnosis not present

## 2021-11-04 LAB — BASIC METABOLIC PANEL
Anion gap: 6 (ref 5–15)
BUN: <5 mg/dL — ABNORMAL LOW (ref 6–20)
CO2: 30 mmol/L (ref 22–32)
Calcium: 8.2 mg/dL — ABNORMAL LOW (ref 8.9–10.3)
Chloride: 108 mmol/L (ref 98–111)
Creatinine, Ser: 0.39 mg/dL — ABNORMAL LOW (ref 0.44–1.00)
GFR, Estimated: >60 mL/min (ref >60)
Glucose, Bld: 88 mg/dL (ref 70–99)
Potassium: 3.4 mmol/L — ABNORMAL LOW (ref 3.5–5.1)
Sodium: 144 mmol/L (ref 135–145)

## 2021-11-04 LAB — BLOOD GAS, ARTERIAL
Acid-Base Excess: 8.5 mmol/L — ABNORMAL HIGH (ref 0.0–2.0)
Bicarbonate: 34.5 mmol/L — ABNORMAL HIGH (ref 20.0–28.0)
O2 Saturation: 97.3 %
Patient temperature: 36.6
pCO2 arterial: 51 mmHg — ABNORMAL HIGH (ref 32–48)
pH, Arterial: 7.44 (ref 7.35–7.45)
pO2, Arterial: 75 mmHg — ABNORMAL LOW (ref 83–108)

## 2021-11-04 LAB — TSH: TSH: 3.577 u[IU]/mL (ref 0.350–4.500)

## 2021-11-04 MED ORDER — CIPROFLOXACIN IN D5W 400 MG/200ML IV SOLN
400.0000 mg | Freq: Two times a day (BID) | INTRAVENOUS | Status: AC
  Administered 2021-11-04 – 2021-11-07 (×7): 400 mg via INTRAVENOUS
  Filled 2021-11-04 (×7): qty 200

## 2021-11-04 MED ORDER — IOHEXOL 300 MG/ML  SOLN
75.0000 mL | Freq: Once | INTRAMUSCULAR | Status: AC | PRN
  Administered 2021-11-04: 75 mL via INTRAVENOUS

## 2021-11-04 MED ORDER — THIAMINE HCL 100 MG/ML IJ SOLN
500.0000 mg | Freq: Three times a day (TID) | INTRAMUSCULAR | Status: AC
  Administered 2021-11-05 – 2021-11-07 (×9): 500 mg via INTRAVENOUS
  Filled 2021-11-04 (×10): qty 5

## 2021-11-04 MED ORDER — POTASSIUM CHLORIDE 10 MEQ/100ML IV SOLN
10.0000 meq | INTRAVENOUS | Status: AC
  Administered 2021-11-04 (×4): 10 meq via INTRAVENOUS
  Filled 2021-11-04 (×4): qty 100

## 2021-11-04 MED ORDER — SODIUM CHLORIDE (PF) 0.9 % IJ SOLN
INTRAMUSCULAR | Status: AC
  Filled 2021-11-04: qty 50

## 2021-11-04 NOTE — Consult Note (Addendum)
Neurology Consultation Reason for Consult: Altered mental status Requesting Physician: Jacki Cones   CC: Confusion   History is obtained from: Chart review and husband by phone  HPI: Melissa Case is a 61 y.o. 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  She was found to have a UTI initially started on ceftriaxone, organism found to be resistant so then briefly escalated to cefepime but then changed to ciprofloxacin to minimize lowering of the seizure threshold.  EEG was negative for seizure activity, showed intermittent generalized slowing.  Due to persistent altered mental status neurology has been consulted  Husband describes: She was doing a lot of mumbling, moving in her sleep, but she would be arouse briefly  Sleeping a lot. No fevers,  no infectious complaints Steady decline Neurosurgeon had been tapering steroid, and she went downhill from there (about one month ago stopped, very slow taper was used).  On steroids she was hungry and much more active (walking with a walker, getting to the bathroom on her own). She was last this ambulatory about 2-3 weeks prior to last hospitalization.      Regarding her seizure history, she first had a seizure October 27, 2019, with long-term monitoring picking up seizure activity from the right temporal lobe where she was found to have an enhancing lesion.  This was favored to be a postradiation necrosis finding and on her last MRI 04/13/2021 it was improving.  She follows with Dr. April Manson who noted that she was taken off of phenytoin due to interaction with  steroids and she did not tolerate lacosamide due to oversedation.  She had seemed to tolerate the combination of Keppra and Depakote well without seizure recurrence since October 2022 (500 bid and 750 bid respectively)  She was recently hospitalized from 5/24 through 5/31 for lower extremity edema felt to be secondary to venous insufficiency/lymphedema and acute on chronic diastolic congestive heart failure.  She was recommended to go to rehab but insisted on discharge home.  Premorbid modified rankin scale: 3-4     3 - Moderate disability. Requires some help, but able to walk unassisted.     4 - Moderately severe disability. Unable to attend to own bodily needs without assistance, and unable to walk unassisted.  ROS: Unable to obtain due to altered mental status.   Past Medical History:  Diagnosis Date   Cancer ([place])    Partoid   Depression    DVT (deep venous thrombosis) (HCC)    GERD (gastroesophageal reflux disease)    History of radiation to head and neck region 09/16/2019   History IMRT (09/16/19 - 10/27/19) for parotid tumor stage pT3, pN2b.   Panic attack    Seizures ([place])    Past Surgical History:  Procedure Laterality Date   ABDOMINAL HYSTERECTOMY     CHOLECYSTECTOMY N/A 11/21/2017   Procedure: LAPAROSCOPIC CHOLECYSTECTOMY WITH INTRAOPERATIVE CHOLANGIOGRAM;  Surgeon: Jovita Kussmaul, MD;  Location: [facility];  Service: General;  Laterality: N/A;   MULTIPLE EXTRACTIONS WITH ALVEOLOPLASTY N/A 08/28/2019   Procedure: Extraction of tooth #'s 3-7, 10-15, and 20-31 with alveoloplatsy, maxillary right buccal exostosis reduction, and bilateral mandibular tori reductions.;  Surgeon: Lenn Cal, DDS;  Location: [facility] OR;  Service: Oral Surgery;  Laterality: N/A;   PAROTIDECTOMY Right 07/22/2019   with biopsy; done at [facility] by Dr. Fredricka Bonine   Current Outpatient Medications  Medication Instructions   apixaban (ELIQUIS) 5 mg, Oral, 2 times daily   divalproex (DEPAKOTE) 500 mg,  Oral, 2 times daily   divalproex (DEPAKOTE) 250 mg, Oral, 2 times daily, (In addition to '500mg'$  twice daily for a total dosage of '750mg'$  BID).   esomeprazole (NEXIUM) 40 mg, Oral, 2 times daily before meals   furosemide (LASIX) 40 mg, Oral, 2 times daily   levETIRAcetam (KEPPRA) 500 mg, Oral, 2 times daily   PARoxetine (PAXIL) 40 mg, Oral, Daily   potassium chloride SA (KLOR-CON M) 20 MEQ tablet 20 mEq, Oral, 2 times daily   traZODone (DESYREL) 50 mg, Oral, At bedtime PRN     Family History  Problem Relation Age of Onset   Depression Sister    Heart disease Mother    Diabetes Father    Breast cancer Paternal Grandmother    Colon cancer Neg Hx    Pancreatic cancer Neg Hx    Esophageal cancer Neg Hx    Stomach cancer Neg Hx    Rectal cancer Neg Hx    Liver cancer Neg Hx     Social History:  reports that she quit smoking about 9 months ago. Her smoking use included cigarettes. She started smoking about 45 years ago. She has a 21.00 pack-year smoking history. She has been exposed to tobacco smoke. She has never used smokeless tobacco. She reports that she does not drink alcohol and does not use drugs.  Exam: Current vital signs: BP 115/81 (BP Location: Right Wrist)   Pulse 87   Temp 97.7 F (36.5 C) (Oral)   Resp 17   Ht '5\' 4"'$  (1.626 m)   Wt 104.3 kg   SpO2 100%   BMI 39.47 kg/m  Vital signs in last 24 hours: Temp:  [97.7 F (36.5 C)-98.3 F (36.8 C)] 97.7 F (36.5 C) (06/16 1420) Pulse Rate:  [77-87] 87 (06/16 1816) Resp:  [17-19] 17 (06/16 1600) BP: (115-137)/(67-116) 115/81 (06/16 1816) SpO2:  [94 %-100 %] 100 % (06/16 1420) Weight:  [104.3 kg] 104.3 kg (06/16 0500)   Physical Exam  Constitutional: Appears chronically ill, lethargic, propped up in bed Psych: Minimally interactive, cooperative when awoken Eyes: No scleral injection HENT: No oropharyngeal obstruction.  MSK: no joint deformities.  Cardiovascular: Perfusing extremities well Respiratory: Nasal cannula  in place, breathing comfortably GI: Soft.  No distension. There is no tenderness.  Skin: Bilateral lower extremities wrapped tightly, with chronic edema related skin changes extending into the visible thighs  Neuro: Mental Status: Patient is extremely sleepy, arouses briefly.  Reports that she is 22 (she is 61 years old), that it is 2023, tends to perseverate on prior answers when asked further orientation questions.  Names thumb and little finger and then stops naming further objects.  Too sleepy to follow most commands, but with repeated stimulation will for example stick out her tongue Cranial Nerves: II: Visual Fields are difficult to assess due to somnolence. Pupils are equal, round, and reactive to light.   III,IV, VI: Looks in all directions, initially possibly has a left gaze preference but then keeps looking down at her mittens which she is trying to remove V: Facial sensation intact bilaterally to eyelash brush VII: Facial movement is notable for right lower motor neuron facial droop.  VIII: extremely hard of hearing  9/10 difficult to assess given her mental status XII: tongue is midline without atrophy or fasciculations.  Motor: Paratonia throughout, LUE is 4/5 bilaterally in the upper extremities and fairly symmetric. Does not move lower extremities antigravity, but has at least trace movement in both lower extremities Sensory: Appears equally reactive to stimulation in all 4 extremities Deep Tendon Reflexes: 2+ and symmetric in the brachioradialis and patellae.  Cerebellar/Gait:  Deferred   I have reviewed labs in epic and the results pertinent to this consultation are:  Basic Metabolic Panel: Recent Labs  Lab 11/05/2021 1225 11/02/21 0423 11/03/21 0413 11/03/21 1024 11/04/21 0935  NA 141 145 147*  --  144  K 3.0* 2.7* 3.1*  --  3.4*  CL 98 102 108  --  108  CO2 32 33* 30  --  30  GLUCOSE 92 86 71  --  88  BUN 7 8 <5*  --  <5*  CREATININE 0.57 0.51 0.50  --  0.39*   CALCIUM 8.0* 8.6* 8.1*  --  8.2*  MG  --   --  2.0 2.0  --     CBC: Recent Labs  Lab 11/18/2021 1225 11/02/21 0423 11/03/21 0413  WBC 6.1 6.7 6.3  NEUTROABS 3.5  --   --   HGB 13.1 12.0 10.7*  HCT 42.0 37.8 35.2*  MCV 100.7* 101.1* 103.8*  PLT 250 228 201    Coagulation Studies: No results for input(s): "LABPROT", "INR" in the last 72 hours.   Ammonia 54 (3  mo ago) -> 37 (3 days ago) -> 25 (2 days ago)  Lab Results  Component Value Date   TSH 3.577 11/04/2021   Lab Results  Component Value Date   VITAMINB12 1,386 (H) 11/02/2021     Latest Reference Range & Units 10/23/2021 12:27 11/02/21 10:33  Valproic Acid,S 50.0 - 100.0 ug/mL 56 53  Review of records, Depakote was administered on the 14th 1241, making the level a true trough  I have reviewed the images obtained:  Head CT personally reviewed, agree with radiology: No acute intracranial process.  Stable head CT. (Area of interest substantially obscured by artifact)   Impression: Patient appears to have an examination consistent with hypoactive delirium, however this is a diagnosis of exclusion.  I am most concerned about the possibility of worsening vasogenic edema of her right temporal lobe given she has worsened with tapering of her steroids.  Could also consider adrenal insufficiency in the setting of her chronic steroid use, which I appreciate primary team's evaluation for.  With the very minimal oral intake reported for weeks, thiamine depletion is also possibility as typically stores of thiamine last 1 to 3 weeks.  Niacin deficiency can also lead to altered mental status, and will round out the nutritional work-up by checking levels of these two vitamins.  Recommendations: - Thiamine level, niacin level - Empiric thiamine 500 mg every 8 hours for 3 days - MRI Brain w/ and w/o  - Appreciate primary team consideration of possible adrenal insufficiency - Continue Depakote 500 mg every 8 hours IV and Keppra 500 mg  every 12 hours IV while patient is too sleepy to take p.o. reliably - Neurology will follow   Plan discussed with husband via phone  Lesleigh Noe MD-PhD [facility] [phone] Available 7 AM to 7 PM, outside these hours please contact Neurologist on call listed on AMION

## 2021-11-04 NOTE — TOC Progression Note (Signed)
Transition of Care [facility]) - Progression Note    Patient Details  Name: Curtis Uriarte MRN: [id number] Date of Birth: [birth year]  Transition of Care [facility]) CM/SW Contact  Purcell Mouton, RN Phone Number: 11/04/2021, 3:25 PM  Clinical Narrative:      Transition of Care (TOC) Screening Note   Patient Details  Name: Kennethia Lynes Date of Birth: 1961/01/07   Transition of Care [facility]) CM/SW Contact:    Purcell Mouton, RN Phone Number: 11/04/2021, 3:25 PM    Transition of Care Department (TOC) has reviewed patient and no TOC needs have been identified at this time. We will continue to monitor patient advancement through interdisciplinary progression rounds. If new patient transition needs arise, please place a TOC consult.         Expected Discharge Plan and Services                                                 Social Determinants of Health (SDOH) Interventions    Readmission Risk Interventions    10/14/2021    2:14 PM  Readmission Risk Prevention Plan  Transportation Screening Complete  Medication Review (RN Care Manager) Complete  PCP or Specialist appointment within 3-5 days of discharge Complete  HRI or Home Care Consult Complete  SW Recovery Care/Counseling Consult Complete  [place] Complete

## 2021-11-04 NOTE — Progress Notes (Signed)
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  According to her husband she has been confused not responding appropriately not acting as usual.  At baseline she is able to walk around do things on her own.  Felt that she was not eating or drinking and was dehydrated.  Patient had nausea vomiting and confusion at the time of admission.  This was thought to be due to urinary tract infection. She is on Keppra and Depakote for seizures.  Per husband her last seizure was in October 2022. She does have chronic venous stasis dermatitis. She was admitted to the hospital last month and discharged 10/12/2021 with acute on chronic diastolic CHF. She is on ns 75 cc hr was on lasix at home 40 mg bid  Assessment & Plan:   Principal Problem:   UTI (urinary tract infection) Active Problems:   Generalized weakness   Cancer of parotid gland (HCC)   History of pulmonary embolus (PE)   Depression   Seizure (HCC)   DVT (deep venous thrombosis) (HCC)   Acute metabolic encephalopathy   Chronic diastolic CHF (congestive heart failure) (HCC)   Obesity, Class III, BMI 40-49.9 (morbid obesity) (HCC)   Nausea and vomiting   Hypokalemia   #1 acute encephalopathy which is probably multifactorial-UTI/dehydration- Her UA was positive for leukocytes and nitrites with few bacteria and more than 50 WBCs.  She was started on Rocephin.  Urine culture grew more than 100,000 colonies of Enterobacter sensitive to ciprofloxacin.  She was started on cefepime yesterday which was stopped today and started ciprofloxacin due to mental status changes wanted to  avoid cefepime. UA is also positive for ketones, on Lasix at home. Continue IV fluids since she is not able to take anything p.o. safely yet. Patient has not improved with the above treatments. Ammonia level 37 Eeg no active seizures  ABG on admission 7.4 6/54/44 Depakote level normal TSH pending CT head shows changes consistent with postradiation but nothing acute.  #2 nausea vomiting -resolved    #3 hypokalemia replete IV Mag 1.9  #4 history of seizure disorder per husband her last seizure was in October 2022.  Patient has history of brain mets from parotid gland malignancy.  Continue Keppra and Depakote IV. EEG shows encephalopathy no active seizures  #5 history of DVT and PE unable to take Eliquis p.o. due to change in mental status, we will change to Lovenox  #6 chronic diastolic heart failure on Lasix at home which has been on hold due to no p.o. intake and dehydration on ivf 50 cc an hour monitor closely  #7 depression on Paxil   #8 chronic venous stasis dermatitis seen by wound care no evidence of open wounds     Estimated body mass index is 39.47 kg/m as calculated from the following:   Height as of this encounter: '5\' 4"'$  (1.626 m).   Weight as of this encounter: 104.3 kg.  DVT prophylaxis: Lovenox Code Status: Full code Family Communication: Discussed with husband at bedside Disposition Plan:  Status is: Inpatient    Consultants:  Neurology  Procedures: None  antimicrobials: Ciprofloxacin Subjective:  Patient in bed husband by  the bedside Very concerned She is still confused restless has hand mittens in place for safety After multiple attempts patient opened her eyes looked at me and is trying to speak but not clear she did not follow commands or answer any questions  Objective: Vitals:   11/03/21 1229 11/03/21 2057 11/04/21 0300 11/04/21 0500  BP: (!) 104/49 121/67 115/72   Pulse: 80 81 77   Resp: '18 18 19   '$ Temp: 98.3 F (36.8 C) 98.3 F (36.8  C) 98 F (36.7 C)   TempSrc: Oral Oral Oral   SpO2: 99% 94% 97%   Weight:    104.3 kg  Height:        Intake/Output Summary (Last 24 hours) at 11/04/2021 1240 Last data filed at 11/04/2021 0909 Gross per 24 hour  Intake 1497.49 ml  Output 925 ml  Net 572.49 ml    Filed Weights   11/02/21 0445 11/03/21 0500 11/04/21 0500  Weight: 103 kg 103.2 kg 104.3 kg    Examination:  General exam: Appears chronically ill-appearing restless confused  respiratory system: Clear to auscultation. Respiratory effort normal. Cardiovascular system: S1 & S2 heard, RRR. No JVD, murmurs, rubs, gallops or clicks. No pedal edema. Gastrointestinal system: Abdomen is nondistended, soft and nontender. No organomegaly or masses felt. Normal bowel sounds heard. Central nervous system: Restless confused  extremities: 1+ edema covered with Unna boots  skin: Chronic venous stasis ulcers Psychiatry: Unable to assess   Data Reviewed: I have personally reviewed following labs and imaging studies  CBC: Recent Labs  Lab 11/02/2021 1225 11/02/21 0423 11/03/21 0413  WBC 6.1 6.7 6.3  NEUTROABS 3.5  --   --   HGB 13.1 12.0 10.7*  HCT 42.0 37.8 35.2*  MCV 100.7* 101.1* 103.8*  PLT 250 228 741    Basic Metabolic Panel: Recent Labs  Lab 11/05/2021 1225 11/02/21 0423 11/03/21 0413 11/03/21 1024 11/04/21 0935  NA 141 145 147*  --  144  K 3.0* 2.7* 3.1*  --  3.4*  CL 98 102 108  --  108  CO2 32 33* 30  --  30  GLUCOSE 92 86 71  --  88  BUN 7 8 <5*  --  <5*  CREATININE 0.57 0.51 0.50  --  0.39*  CALCIUM 8.0* 8.6* 8.1*  --  8.2*  MG  --   --  2.0 2.0  --     GFR: Estimated Creatinine Clearance: 88 mL/min (A) (by C-G formula based on SCr of 0.39 mg/dL (L)). Liver Function Tests: Recent Labs  Lab 11/12/2021 1225 11/02/21 0423 11/03/21 0413  AST 52* 49* 43*  ALT '31 29 25  '$ ALKPHOS 95 95 79  BILITOT 0.7 0.8 0.8  PROT 5.2* 5.2* 4.6*  ALBUMIN 2.2* 2.3* 1.9*    No results for input(s): "LIPASE",  "AMYLASE" in the last 168 hours. Recent Labs  Lab 10/29/2021 1225 11/02/21 1033  AMMONIA 37* 25    Coagulation Profile: No results for input(s): "INR", "PROTIME" in the last 168 hours. Cardiac Enzymes: No results for input(s): "CKTOTAL", "CKMB", "CKMBINDEX", "TROPONINI" in the last 168 hours. BNP (last 3 results) Recent Labs    08/12/21 1009  PROBNP 28.0    HbA1C: No results for input(s): "HGBA1C" in the last 72 hours. CBG: Recent Labs  Lab 10/22/2021 1238  GLUCAP 91    Lipid Profile: No results for input(s): "CHOL", "HDL", "LDLCALC", "TRIG", "CHOLHDL", "LDLDIRECT" in the last 72 hours. Thyroid Function Tests: No results for input(s): "TSH", "T4TOTAL", "  FREET4", "T3FREE", "THYROIDAB" in the last 72 hours. Anemia Panel: Recent Labs    11-09-21 1127  VITAMINB12 1,386*  FOLATE 8.9    Sepsis Labs: No results for input(s): "PROCALCITON", "LATICACIDVEN" in the last 168 hours.  Recent Results (from the past 240 hour(s))  Urine Culture     Status: Abnormal   Collection Time: 11/08/2021  3:23 PM   Specimen: In/Out Cath Urine  Result Value Ref Range Status   Specimen Description   Final    IN/OUT CATH URINE Performed at [facility] [address] [place], [place] [address]    Special Requests   Final    NONE Performed at [facility], [place] [address] [place], [place] [address]    Culture >=100,000 COLONIES/mL ENTEROBACTER AEROGENES (A)  Final   Report Status 11/03/2021 FINAL  Final   Organism ID, Bacteria ENTEROBACTER AEROGENES (A)  Final      Susceptibility   Enterobacter aerogenes - MIC*    CEFAZOLIN >=64 RESISTANT Resistant     CEFEPIME 1 SENSITIVE Sensitive     CEFTRIAXONE >=64 RESISTANT Resistant     CIPROFLOXACIN <=0.25 SENSITIVE Sensitive     GENTAMICIN <=1 SENSITIVE Sensitive     IMIPENEM 1 SENSITIVE Sensitive     NITROFURANTOIN 128 RESISTANT Resistant     TRIMETH/SULFA <=20 SENSITIVE Sensitive     PIP/TAZO >=128  RESISTANT Resistant     * >=100,000 COLONIES/mL ENTEROBACTER AEROGENES         Radiology Studies: EEG adult  Result Date: 2021-11-09 Lora Havens, MD     November 09, 2021  3:08 PM Patient Name: Melissa Case MRN: [id number] Epilepsy Attending: Lora Havens Referring Physician/Provider: Georgette Shell, MD Date: 2021-11-09 Duration: 26.47 mins Patient history: 61yo F with h/o seizure now with ams. EEG to evaluate for seizure Level of alertness: Awake AEDs during EEG study: LEV, VPA Technical aspects: This EEG study was done with scalp electrodes positioned according to the 10-20 International system of electrode placement. Electrical activity was acquired at a sampling rate of '500Hz'$  and reviewed with a high frequency filter of '70Hz'$  and a low frequency filter of '1Hz'$ . EEG data were recorded continuously and digitally stored. Description: The posterior dominant rhythm consists of 8 Hz activity of moderate voltage (25-35 uV) seen predominantly in posterior head regions, symmetric and reactive to eye opening and eye closing. EEG showed intermittent generalized 3 to 6 Hz theta-delta slowing. Hyperventilation and photic stimulation were not performed.   ABNORMALITY - Intermittent slow, generalized IMPRESSION: This study is suggestive of mild diffuse encephalopathy, nonspecific etiology. No seizures or epileptiform discharges were seen throughout the recording. Priyanka [name]        Scheduled Meds:  enoxaparin (LOVENOX) injection  100 mg Subcutaneous BID   pantoprazole (PROTONIX) IV  40 mg Intravenous Q12H   PARoxetine  40 mg Oral Daily   Continuous Infusions:  ciprofloxacin     dextrose 50 mL/hr at 11/04/21 0811   levETIRAcetam 500 mg (11/04/21 0810)   potassium chloride 10 mEq (11/04/21 1215)   valproate sodium 500 mg (11/04/21 1128)     LOS: 3 days    Time spent: 15mn  EGeorgette Shell MD  11/04/2021, 12:40 PM

## 2021-11-04 NOTE — Progress Notes (Signed)
PT Cancellation Note  Patient Details Name: Melissa Case MRN: [id number] DOB: December 21, 1960   Cancelled Treatment:    Reason Eval/Treat Not Completed: Fatigue/lethargy limiting ability to participate. Will check back tomorrow.  [place] Pager [phone] Office [phone]    Claretha Cooper 11/04/2021, 12:01 PM

## 2021-11-05 ENCOUNTER — Inpatient Hospital Stay (HOSPITAL_COMMUNITY): Payer: BC Managed Care – PPO

## 2021-11-05 DIAGNOSIS — N3000 Acute cystitis without hematuria: Secondary | ICD-10-CM | POA: Diagnosis not present

## 2021-11-05 LAB — CBC
HCT: 39.2 % (ref 36.0–46.0)
Hemoglobin: 12.2 g/dL (ref 12.0–15.0)
MCH: 32.2 pg (ref 26.0–34.0)
MCHC: 31.1 g/dL (ref 30.0–36.0)
MCV: 103.4 fL — ABNORMAL HIGH (ref 80.0–100.0)
Platelets: 155 10*3/uL (ref 150–400)
RBC: 3.79 MIL/uL — ABNORMAL LOW (ref 3.87–5.11)
RDW: 17.7 % — ABNORMAL HIGH (ref 11.5–15.5)
WBC: 5.4 10*3/uL (ref 4.0–10.5)
nRBC: 0 % (ref 0.0–0.2)

## 2021-11-05 LAB — HEMOGLOBIN A1C
Hgb A1c MFr Bld: 5.9 % — ABNORMAL HIGH (ref 4.8–5.6)
Mean Plasma Glucose: 122.63 mg/dL

## 2021-11-05 LAB — COMPREHENSIVE METABOLIC PANEL
ALT: 21 U/L (ref 0–44)
AST: 35 U/L (ref 15–41)
Albumin: 1.9 g/dL — ABNORMAL LOW (ref 3.5–5.0)
Alkaline Phosphatase: 83 U/L (ref 38–126)
Anion gap: 5 (ref 5–15)
BUN: <5 mg/dL — ABNORMAL LOW (ref 6–20)
CO2: 31 mmol/L (ref 22–32)
Calcium: 8.3 mg/dL — ABNORMAL LOW (ref 8.9–10.3)
Chloride: 107 mmol/L (ref 98–111)
Creatinine, Ser: 0.41 mg/dL — ABNORMAL LOW (ref 0.44–1.00)
GFR, Estimated: >60 mL/min (ref >60)
Glucose, Bld: 103 mg/dL — ABNORMAL HIGH (ref 70–99)
Potassium: 3.4 mmol/L — ABNORMAL LOW (ref 3.5–5.1)
Sodium: 143 mmol/L (ref 135–145)
Total Bilirubin: 0.5 mg/dL (ref 0.3–1.2)
Total Protein: 4.9 g/dL — ABNORMAL LOW (ref 6.5–8.1)

## 2021-11-05 LAB — GLUCOSE, CAPILLARY
Glucose-Capillary: 98 mg/dL (ref 70–99)
Glucose-Capillary: 101 mg/dL — ABNORMAL HIGH (ref 70–99)
Glucose-Capillary: 111 mg/dL — ABNORMAL HIGH (ref 70–99)
Glucose-Capillary: 110 mg/dL — ABNORMAL HIGH (ref 70–99)

## 2021-11-05 LAB — CORTISOL: Cortisol, Plasma: 14.5 ug/dL

## 2021-11-05 MED ORDER — INSULIN ASPART 100 UNIT/ML IJ SOLN
0.0000 [IU] | Freq: Three times a day (TID) | INTRAMUSCULAR | Status: DC
  Administered 2021-11-07 (×2): 1 [IU] via SUBCUTANEOUS
  Administered 2021-11-08 – 2021-11-11 (×2): 2 [IU] via SUBCUTANEOUS
  Administered 2021-11-11: 1 [IU] via SUBCUTANEOUS
  Administered 2021-11-12: 3 [IU] via SUBCUTANEOUS

## 2021-11-05 MED ORDER — GADOBUTROL 1 MMOL/ML IV SOLN
10.0000 mL | Freq: Once | INTRAVENOUS | Status: AC | PRN
  Administered 2021-11-05: 10 mL via INTRAVENOUS

## 2021-11-05 NOTE — Evaluation (Signed)
Physical Therapy Evaluation Patient Details Name: Melissa Case MRN: [id number] DOB: 1960-08-06 Today's Date: 11/05/2021  History of Present Illness  Patient is 61 y.o. female presented this time to the ED 11/02/2011 with  increased confusion, lethargy, Nausea and vomiting. She had a recent admission with worsening venous stasis ulcers as well as increased redness in the lower extremities, now with Bilateral unna boots.   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  Clinical Impression  Pt very lethargic, tries to open her eyes with verbal and tactile stimuli but can only open for half second. Total assist to get pt to edge of bed hoping for increased alert levels. Pt tolerated sitting edge of bed for about 15-17 minutes. With close supervision for static sitting, no balance reaction or right reaction to pertubation's noted. Tried to increase alert levels to drink or eat. Pt initially so weak could not pull water up through straw however did finally get 1 sip, also had 3 spoonfuls of soup during this duration. No intentional movements with upper extremities except trying to wipe her mouth with ataxic arm movements. No LE movements and B LE even at thigh level VERY tight with edema.  Even though she was not alert enough for conversation or could not open her eyes during session, husband states this is the most alert she has been in days. 2 weeks ago on 5/30 PT note while in hospital pt was min guard for ambulation with RW. This is  drastic change. Will continue to follow while here on acute care.      Recommendations for follow up therapy are one component of a multi-disciplinary discharge planning process, led by the attending physician.  Recommendations may be updated based on patient status, additional functional criteria and  insurance authorization.  Follow Up Recommendations Skilled nursing-short term rehab (<3 hours/day)    Assistance Recommended at Discharge Frequent or constant Supervision/Assistance (constand assistance Total Asisst at this time)  Patient can return home with the following  Assist for transportation;Help with stairs or ramp for entrance;Two people to help with bathing/dressing/bathroom;Two people to help with walking and/or transfers;Assistance with feeding;Assistance with cooking/housework;Other (comment) (total assist. Husband will need ramp and likley stretcher transportation, and 24 caregiver assist that know how ot move and help her with lift equipment)    Equipment Recommendations Other (comment) (husband having contractor come Tues to provide estimate for ramp.)  Recommendations for Other Services       Functional Status Assessment Patient has had a recent decline in their functional status and demonstrates the ability to make significant improvements in function in a reasonable and predictable amount of time.     Precautions / Restrictions Precautions Precautions: Fall Restrictions Weight Bearing Restrictions: No      Mobility  Bed Mobility Overal bed mobility: Needs Assistance Bed Mobility: Rolling Rolling: Total assist, +2 for physical assistance   Supine to sit: Total assist, +2 for physical assistance     General bed mobility comments: total assist even with time and cues pt uable to help with supine to sit or sit to supine and pt very challenging 2 person assist at that. Very stiff and swollen BLEs    Transfers Overall transfer level:  (unable to transfer, will require hoyer lift OOB)  Ambulation/Gait                  Stairs            Wheelchair Mobility    Modified Rankin (Stroke Patients Only)       Balance Overall balance assessment: Needs assistance Sitting-balance support: Feet supported Sitting  balance-Leahy Scale: Poor     Standing balance support:  (unable to stand) Standing balance-Leahy Scale: Zero                               Pertinent Vitals/Pain Pain Assessment Pain Assessment:  (unable to report during session)    Home Living Family/patient expects to be discharged to:: Skilled nursing facility Living Arrangements: Spouse/significant other Available Help at Discharge: Family;Available PRN/intermittently Type of Home: House Home Access: Stairs to enter Entrance Stairs-Rails: Left;Right;Can reach both Entrance Stairs-Number of Steps: 4   Home Layout: One level Home Equipment: Agricultural consultant (2 wheels);Transport chair;Shower seat Additional Comments: pt limited to living room and bathroom    Prior Function Prior Level of Function : Needs assist       Physical Assist : Mobility (physical);ADLs (physical) Mobility (physical): Stairs;Gait ADLs (physical): Bathing;Dressing;IADLs Mobility Comments: prior to seizure, pt typically Independent in all mobility though since seizure one year ago, has required intermittent use of RW for mobility, w/c assist outside of home; reports at least one recent fall, however for last 2 weeks increased weakness and inability to use RW as she typically did before this. ADLs Comments: Prior to seizure one year ago, pt was completely Independent with ADls, IADLs and was working full time in a warehouse. After seizure, pt with increased difficulty bathing, dressing and toilet transfers (improved when BSC placed over toilet) and has been sponge bathing rather than showering.     Hand Dominance   Dominant Hand: Right    Extremity/Trunk Assessment        Lower Extremity Assessment Lower Extremity Assessment: Difficult to assess due to impaired cognition (pt was not able to follow any commoands to may purposeful movmetn patterns. Did not move LEs independently, did move hands some but very ataic and slow , no intentioal  movement patterns.)       Communication   Communication: Other (comment);HOH (husband states typically does not have trouble communicating however recent memory challenges but still communication was fully understandable. Today pt very lethargic w/ only afew word put together at times w/ increased stimlui /asking simple questions.)  Cognition Arousal/Alertness: Lethargic (unable to assess fullly congitive deficits due to increased lethergy)   Overall Cognitive Status: Impaired/Different from baseline                                          General Comments      Exercises     Assessment/Plan    PT Assessment Patient needs continued PT services  PT Problem List Decreased strength;Decreased range of motion;Decreased activity tolerance;Decreased balance;Decreased mobility       PT Treatment Interventions DME instruction;Gait training;Stair training;Functional mobility training;Therapeutic activities;Therapeutic exercise;Balance training;Neuromuscular re-education;Patient/family education;Cognitive remediation    PT Goals (Current goals can be found in the Care Plan section)  Acute Rehab PT Goals PT Goal Formulation: Patient unable to participate in goal setting Time For Goal Achievement: 11/19/21 Potential to Achieve Goals: Fair    Frequency Min 3X/week  Co-evaluation               AM-PAC PT "6 Clicks" Mobility  Outcome Measure Help needed turning from your back to your side while in a flat bed without using bedrails?: Total Help needed moving from lying on your back to sitting on the side of a flat bed without using bedrails?: Total Help needed moving to and from a bed to a chair (including a wheelchair)?: Total Help needed standing up from a chair using your arms (e.g., wheelchair or bedside chair)?: Total Help needed to walk in hospital room?: Total Help needed climbing 3-5 steps with a railing? : Total 6 Click Score: 6    End of Session    Activity Tolerance: Patient limited by lethargy Patient left: with call bell/phone within reach;with bed alarm set;with family/visitor present Nurse Communication: Mobility status;Need for lift equipment (will need hoyer lift of OOB) PT Visit Diagnosis: Muscle weakness (generalized) (M62.81);Other abnormalities of gait and mobility (R26.89)    Time: 1027-2536 PT Time Calculation (min) (ACUTE ONLY): 45 min   Charges:   PT Evaluation $PT Eval Moderate Complexity: 1 Mod PT Treatments $Therapeutic Activity: 8-22 mins $Neuromuscular Re-education: 8-22 mins        Macguire Holsinger, PT, MPT Acute Rehabilitation Services Office: [phone] Pager: [phone] 11/05/2021   Marella Bile 11/05/2021, 1:46 PM

## 2021-11-05 NOTE — Plan of Care (Signed)
Neurology Plan of [place] MR# [id number] 11/05/2021   rEEG did not show seizure or epileptiform activity MRI still pending Will evaluate after MRI brain   Electronically signed by:  Lynnae Sandhoff, MD Page: [phone] 11/05/2021, 6:03 PM

## 2021-11-05 NOTE — Progress Notes (Signed)
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  According to her husband she has been confused not responding appropriately not acting as usual.  At baseline she is able to walk around do things on her own.  Felt that she was not eating or drinking and was dehydrated.  Patient had nausea vomiting and confusion at the time of admission.  This was thought to be due to urinary tract infection. She is on Keppra and Depakote for seizures.  Per husband her last seizure was in October 2022. She does have chronic venous stasis   She was admitted to the hospital last month and discharged 10/12/2021 with acute on chronic diastolic CHF. She is on ns 75 cc hr was on lasix at home 40 mg bid  11/05/2021 patient was seen by neurology. Started on high-dose thiamine. MRI brain pending.  Assessment & Plan:   Principal Problem:   UTI (urinary tract infection) Active Problems:   Generalized weakness   Cancer of parotid gland (HCC)   History of pulmonary embolus (PE)   Depression   Seizure (HCC)   DVT (deep venous thrombosis) (HCC)   Acute metabolic encephalopathy   Chronic diastolic CHF (congestive heart failure) (HCC)   Obesity, Class III, BMI 40-49.9 (morbid obesity) (HCC)   Nausea and vomiting   Hypokalemia   #1 acute encephalopathy which is probably multifactorial-UTI/dehydration- Her UA was positive for leukocytes and nitrites with few bacteria and more than 50 WBCs.  She was started on Rocephin. Urine culture with gram-negative rods resistant to Rocephin and sensitive to cefepime.  Cefepime started 11/03/2021.  And blood  cultures pending negative so far.  Cefepime was stopped 11/04/2021 and Cipro was started.  This was done due to ongoing encephalopathy and not to worsen it with neurotoxicity from cefepime. UA is also positive for ketones, on Lasix at home. Change fluids to D5 50 cc an hour ongoing since admission. Patient has not improved with the above treatments. Ammonia level 37 Eeg no active seizures  ABG on admission 7.4 6/54/44 Depakote level normal CT head shows changes consistent with postradiation but nothing acute Cortisol level normal MRI brain ordered rule out acute findings with history of brain mets Appreciate neurology input.  #2 nausea vomiting -resolved start clear liquid diet.    #3 hypokalemia-potassium 3.4 replete IV Mag 2.0  #4 history of seizure disorder per husband her last seizure was in October 2022.  Patient has history of brain mets from parotid gland malignancy.  Continue Keppra and Depakote IV.  #5 history of DVT and PE unable to take Eliquis p.o. due to change in mental status, we will change to Lovenox  #6 chronic diastolic heart failure on Lasix at home which has been on hold due to no p.o. intake and dehydration on ivf 50 cc an hour monitor closely  #7 depression Home meds on hold  #8 chronic venous stasis dermatitis seen by wound care no evidence of open wounds     Estimated body mass index is 39.81 kg/m as calculated from the following:   Height as of this encounter: '5\' 4"'$  (1.626 m).   Weight as of this encounter:  105.2 kg.  DVT prophylaxis: Lovenox Code Status: Full code Family Communication: Discussed with husband at bedside Disposition Plan:  Status is: Inpatient    Consultants:  Neurology  Procedures: None  antimicrobials: Rocephin  Subjective: Patient is resting in bed husband was at bedside she is a little more awake than yesterday she was able to tell me what the name of her husband and her dog at home.  She calls her dog her boy and told me  that the dog's name is oliver However she still confused not able to follow conversation follows some commands like wiggling of her toes.  When asked to open her eyes and look at me she did look at me.  Objective: Vitals:   11/04/21 1952 11/05/21 0500 11/05/21 0553 11/05/21 1224  BP: 128/61  121/61 106/81  Pulse: 85  78 93  Resp: '18  17 20  '$ Temp: 98.1 F (36.7 C)  99 F (37.2 C) 98.2 F (36.8 C)  TempSrc: Oral  Oral Oral  SpO2: 100%   100%  Weight:  104.6 kg 105.2 kg   Height:        Intake/Output Summary (Last 24 hours) at 11/05/2021 1333 Last data filed at 11/05/2021 0600 Gross per 24 hour  Intake 1680.9 ml  Output 450 ml  Net 1230.9 ml    Filed Weights   11/04/21 0500 11/05/21 0500 11/05/21 0553  Weight: 104.3 kg 104.6 kg 105.2 kg    Examination:  General exam: Appears chronically ill-appearing restless confused  respiratory system: Clear to auscultation. Respiratory effort normal. Cardiovascular system: S1 & S2 heard, RRR. No JVD, murmurs, rubs, gallops or clicks. No pedal edema. Gastrointestinal system: Abdomen is nondistended, soft and nontender. No organomegaly or masses felt. Normal bowel sounds heard. Central nervous system: Restless confused moves all extremities  extremities: No edema Skin: Chronic venous stasis ulcers Psychiatry: Unable to assess   Data Reviewed: I have personally reviewed following labs and imaging studies  CBC: Recent Labs  Lab 11/17/2021 1225 11/02/21 0423 11/03/21 0413 11/05/21 0737  WBC 6.1 6.7 6.3 5.4  NEUTROABS 3.5  --   --   --   HGB 13.1 12.0 10.7* 12.2  HCT 42.0 37.8 35.2* 39.2  MCV 100.7* 101.1* 103.8* 103.4*  PLT 250 228 201 093    Basic Metabolic Panel: Recent Labs  Lab 11/05/2021 1225 11/02/21 0423 11/03/21 0413 11/03/21 1024 11/04/21 0935 11/05/21 0737  NA 141 145 147*  --  144 143  K 3.0* 2.7* 3.1*  --  3.4* 3.4*  CL 98 102 108  --  108 107  CO2 32 33* 30  --  30 31  GLUCOSE 92 86 71  --  88 103*  BUN 7 8  <5*  --  <5* <5*  CREATININE 0.57 0.51 0.50  --  0.39* 0.41*  CALCIUM 8.0* 8.6* 8.1*  --  8.2* 8.3*  MG  --   --  2.0 2.0  --   --     GFR: Estimated Creatinine Clearance: 88.4 mL/min (A) (by C-G formula based on SCr of 0.41 mg/dL (L)). Liver Function Tests: Recent Labs  Lab 10/31/2021 1225 11/02/21 0423 11/03/21 0413 11/05/21 0737  AST 52* 49* 43* 35  ALT '31 29 25 21  '$ ALKPHOS 95 95 79 83  BILITOT 0.7 0.8 0.8 0.5  PROT 5.2* 5.2* 4.6* 4.9*  ALBUMIN 2.2* 2.3* 1.9* 1.9*    No results for input(s): "LIPASE", "AMYLASE" in the last 168 hours. Recent Labs  Lab  10/27/2021 1225 11/02/21 1033  AMMONIA 37* 25    Coagulation Profile: No results for input(s): "INR", "PROTIME" in the last 168 hours. Cardiac Enzymes: No results for input(s): "CKTOTAL", "CKMB", "CKMBINDEX", "TROPONINI" in the last 168 hours. BNP (last 3 results) Recent Labs    08/12/21 1009  PROBNP 28.0    HbA1C: No results for input(s): "HGBA1C" in the last 72 hours. CBG: Recent Labs  Lab 10/20/2021 1238 11/05/21 0827 11/05/21 1123  GLUCAP 91 98 101*    Lipid Profile: No results for input(s): "CHOL", "HDL", "LDLCALC", "TRIG", "CHOLHDL", "LDLDIRECT" in the last 72 hours. Thyroid Function Tests: Recent Labs    11/04/21 1808  TSH 3.577   Anemia Panel: No results for input(s): "VITAMINB12", "FOLATE", "FERRITIN", "TIBC", "IRON", "RETICCTPCT" in the last 72 hours.  Sepsis Labs: No results for input(s): "PROCALCITON", "LATICACIDVEN" in the last 168 hours.  Recent Results (from the past 240 hour(s))  Urine Culture     Status: Abnormal   Collection Time: 10/31/2021  3:23 PM   Specimen: In/Out Cath Urine  Result Value Ref Range Status   Specimen Description   Final    IN/OUT CATH URINE Performed at [facility] [address], [place], [place] [address]    Special Requests   Final    NONE Performed at [facility] [place] [address] [place], [place] [address]     Culture >=100,000 COLONIES/mL ENTEROBACTER AEROGENES (A)  Final   Report Status 11/03/2021 FINAL  Final   Organism ID, Bacteria ENTEROBACTER AEROGENES (A)  Final      Susceptibility   Enterobacter aerogenes - MIC*    CEFAZOLIN >=64 RESISTANT Resistant     CEFEPIME 1 SENSITIVE Sensitive     CEFTRIAXONE >=64 RESISTANT Resistant     CIPROFLOXACIN <=0.25 SENSITIVE Sensitive     GENTAMICIN <=1 SENSITIVE Sensitive     IMIPENEM 1 SENSITIVE Sensitive     NITROFURANTOIN 128 RESISTANT Resistant     TRIMETH/SULFA <=20 SENSITIVE Sensitive     PIP/TAZO >=128 RESISTANT Resistant     * >=100,000 COLONIES/mL ENTEROBACTER AEROGENES         Radiology Studies: CT HEAD W & WO CONTRAST (5MM)  Result Date: 11/04/2021 CLINICAL DATA:  Confusion, altered mental status, history of parotid gland cancer with brain Mets status post XRT EXAM: CT HEAD WITHOUT AND WITH CONTRAST TECHNIQUE: Contiguous axial images were obtained from the base of the skull through the vertex without and with intravenous contrast. RADIATION DOSE REDUCTION: This exam was performed according to the departmental dose-optimization program which includes automated exposure control, adjustment of the mA and/or kV according to patient size and/or use of iterative reconstruction technique. CONTRAST:  36m OMNIPAQUE IOHEXOL 300 MG/ML  SOLN COMPARISON:  11/09/2021 Brain: No evidence of acute infarction, hemorrhage, cerebral edema, mass, mass effect, or midline shift. No hydrocephalus or extra-axial fluid collection. Redemonstrated encephalomalacia with irregular calcification in the mid to anterior right temporal lobe, which is somewhat obscured by beam hardening artifact from the patient's right orbit, but likely unchanged from the prior exam and reflecting the sequela of prior radiation therapy. Periventricular white matter changes, likely the sequela of chronic small vessel ischemic disease. Vascular: No hyperdense vessel. Skull: Normal. Negative for  fracture or focal lesion. Sinuses/Orbits: Mucosal thickening in the inferior right maxillary sinus. Otherwise clear. Redemonstrated metallic foreign body along the anterior aspect of the right globe, which limits evaluation of the right orbit and right temporal lobe. The left orbit is unremarkable. Other: Fluid  throughout the bilateral mastoid air cells, unchanged. IMPRESSION: No acute intracranial process.  Stable head CT. Electronically Signed   By: Merilyn Baba M.D.   On: 11/04/2021 13:41        Scheduled Meds:  enoxaparin (LOVENOX) injection  100 mg Subcutaneous BID   insulin aspart  0-9 Units Subcutaneous TID WC   pantoprazole (PROTONIX) IV  40 mg Intravenous Q12H   PARoxetine  40 mg Oral Daily   Continuous Infusions:  ciprofloxacin 400 mg (11/05/21 0140)   dextrose 50 mL/hr at 11/05/21 0850   levETIRAcetam 500 mg (11/05/21 0852)   thiamine injection 500 mg (11/05/21 0535)   valproate sodium 500 mg (11/05/21 0945)     LOS: 4 days    Time spent: 70 min  Georgette Shell, MD  11/05/2021, 1:33 PM

## 2021-11-06 DIAGNOSIS — N3000 Acute cystitis without hematuria: Secondary | ICD-10-CM | POA: Diagnosis not present

## 2021-11-06 LAB — GLUCOSE, CAPILLARY
Glucose-Capillary: 94 mg/dL (ref 70–99)
Glucose-Capillary: 94 mg/dL (ref 70–99)
Glucose-Capillary: 97 mg/dL (ref 70–99)
Glucose-Capillary: 120 mg/dL — ABNORMAL HIGH (ref 70–99)

## 2021-11-06 MED ORDER — DEXAMETHASONE SODIUM PHOSPHATE 4 MG/ML IJ SOLN: 4.0000 mg | INTRAMUSCULAR | Status: DC

## 2021-11-06 MED ORDER — DEXAMETHASONE SODIUM PHOSPHATE 4 MG/ML IJ SOLN
4.0000 mg | Freq: Three times a day (TID) | INTRAMUSCULAR | Status: DC
  Administered 2021-11-06 – 2021-11-14 (×23): 4 mg via INTRAVENOUS
  Filled 2021-11-06 (×23): qty 1

## 2021-11-06 NOTE — Progress Notes (Signed)
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  According to her husband she has been confused not responding appropriately not acting as usual.  At baseline she is able to walk around do things on her own.  Felt that she was not eating or drinking and was dehydrated.  Patient had nausea vomiting and confusion at the time of admission.  This was thought to be due to urinary tract infection. She is on Keppra and Depakote for seizures.  Per husband her last seizure was in October 2022. She does have chronic venous stasis   She was admitted to the hospital last month and discharged 10/12/2021 with acute on chronic diastolic CHF. She is on ns 75 cc hr was on lasix at home 40 mg bid  11/05/2021 patient was seen by neurology. Started on high-dose thiamine. MRI brain -Sequelae of treated lesion in the right temporal lobe with progressive volume loss and T1 shortening. No definite residual tumor remains. No evidence for residual or recurrent metastatic disease elsewhere in the brain. Progressive generalized volume loss and white matter disease likely reflects the sequela of radiation therapy. Chronic mastoid effusions assessment & Plan:   Principal Problem:   UTI (urinary tract infection) Active Problems:   Generalized weakness   Cancer of parotid gland (HCC)   History of pulmonary embolus (PE)   Depression   Seizure (HCC)   DVT (deep venous thrombosis) (HCC)   Acute metabolic encephalopathy   Chronic diastolic CHF (congestive heart failure) (HCC)   Obesity, Class III, BMI 40-49.9 (morbid obesity) (HCC)   Nausea and  vomiting   Hypokalemia   #1 acute encephalopathy which is probably multifactorial-UTI/dehydration- Her UA was positive for leukocytes and nitrites with few bacteria and more than 50 WBCs.  She was started on Rocephin. Urine culture with gram-negative rods resistant to Rocephin and sensitive to cefepime.  Cefepime started 11/03/2021.  And blood cultures pending negative so far.  Cefepime was stopped 11/04/2021 and Cipro was started.  This was done due to ongoing encephalopathy and not to worsen it with neurotoxicity from cefepime. UA is also positive for ketones, on Lasix at home. Change fluids to D5 50 cc an hour ongoing since admission. Patient has not improved with the above treatments. Ammonia level 37 Eeg no active seizures  ABG on admission 7.4 6/54/44 Depakote level normal CT head shows changes consistent with postradiation but nothing acute Cortisol level normal MRI brain as above Appreciate neurology input.  #2 nausea vomiting -resolved start clear liquid diet.    #3 hypokalemia-potassium 3.4 replete IV Mag 2.0  #4 history of seizure disorder per husband her last seizure was in October 2022.  Patient has history of brain mets from parotid gland malignancy.  Continue Keppra and Depakote IV.  #5 history of DVT and PE unable to take Eliquis p.o. due to change in mental status, we will change to Lovenox  #6 chronic diastolic heart failure on Lasix at home which has been on hold due to no p.o. intake and dehydration on ivf 50 cc an hour monitor closely  #7 depression Home meds on hold  #8 chronic venous stasis dermatitis seen by  wound care no evidence of open wounds     Estimated body mass index is 39.96 kg/m as calculated from the following:   Height as of this encounter: '5\' 4"'$  (1.626 m).   Weight as of this encounter: 105.6 kg.  DVT prophylaxis: Lovenox Code Status: Full code Family Communication: Discussed with husband at bedside Disposition Plan:  Status is:  Inpatient    Consultants:  Neurology  Procedures: None  antimicrobials: Rocephin  Subjective:   Remains about the same as yesterday  Opens eyes looks at me when I called her name Does not really follow commands  Objective: Vitals:   11/05/21 1951 11/06/21 0442 11/06/21 0500 11/06/21 1152  BP: 115/62 127/83  104/66  Pulse: 82 71  80  Resp: '18 18  16  '$ Temp: 98.6 F (37 C) 97.7 F (36.5 C)  98.1 F (36.7 C)  TempSrc: Oral Oral    SpO2: 97% 97%  93%  Weight:   105.6 kg   Height:        Intake/Output Summary (Last 24 hours) at 11/06/2021 1339 Last data filed at 11/06/2021 1300 Gross per 24 hour  Intake 2261.39 ml  Output 500 ml  Net 1761.39 ml    Filed Weights   11/05/21 0500 11/05/21 0553 11/06/21 0500  Weight: 104.6 kg 105.2 kg 105.6 kg    Examination:  General exam: Appears chronically ill-appearing restless confused  respiratory system: Clear to auscultation. Respiratory effort normal. Cardiovascular system: S1 & S2 heard, RRR. No JVD, murmurs, rubs, gallops or clicks. No pedal edema. Gastrointestinal system: Abdomen is nondistended, soft and nontender. No organomegaly or masses felt. Normal bowel sounds heard. Central nervous system: Restless confused moves all extremities  extremities: No edema Skin: Chronic venous stasis ulcers Psychiatry: Unable to assess   Data Reviewed: I have personally reviewed following labs and imaging studies  CBC: Recent Labs  Lab 11/07/2021 1225 11/02/21 0423 11/03/21 0413 11/05/21 0737  WBC 6.1 6.7 6.3 5.4  NEUTROABS 3.5  --   --   --   HGB 13.1 12.0 10.7* 12.2  HCT 42.0 37.8 35.2* 39.2  MCV 100.7* 101.1* 103.8* 103.4*  PLT 250 228 201 361    Basic Metabolic Panel: Recent Labs  Lab 11/07/2021 1225 11/02/21 0423 11/03/21 0413 11/03/21 1024 11/04/21 0935 11/05/21 0737  NA 141 145 147*  --  144 143  K 3.0* 2.7* 3.1*  --  3.4* 3.4*  CL 98 102 108  --  108 107  CO2 32 33* 30  --  30 31  GLUCOSE 92 86 71  --  88  103*  BUN 7 8 <5*  --  <5* <5*  CREATININE 0.57 0.51 0.50  --  0.39* 0.41*  CALCIUM 8.0* 8.6* 8.1*  --  8.2* 8.3*  MG  --   --  2.0 2.0  --   --     GFR: Estimated Creatinine Clearance: 88.7 mL/min (A) (by C-G formula based on SCr of 0.41 mg/dL (L)). Liver Function Tests: Recent Labs  Lab 11/03/2021 1225 11/02/21 0423 11/03/21 0413 11/05/21 0737  AST 52* 49* 43* 35  ALT '31 29 25 21  '$ ALKPHOS 95 95 79 83  BILITOT 0.7 0.8 0.8 0.5  PROT 5.2* 5.2* 4.6* 4.9*  ALBUMIN 2.2* 2.3* 1.9* 1.9*    No results for input(s): "LIPASE", "AMYLASE" in the last 168 hours. Recent Labs  Lab 11/12/2021 1225 11/02/21 1033  AMMONIA 37* 25    Coagulation Profile: No results for input(s): "INR", "PROTIME"  in the last 168 hours. Cardiac Enzymes: No results for input(s): "CKTOTAL", "CKMB", "CKMBINDEX", "TROPONINI" in the last 168 hours. BNP (last 3 results) Recent Labs    08/12/21 1009  PROBNP 28.0    HbA1C: Recent Labs    11/05/21 0819  HGBA1C 5.9*   CBG: Recent Labs  Lab 11/05/21 1123 11/05/21 1624 11/05/21 2033 11/06/21 0729 11/06/21 1148  GLUCAP 101* 111* 110* 94 94    Lipid Profile: No results for input(s): "CHOL", "HDL", "LDLCALC", "TRIG", "CHOLHDL", "LDLDIRECT" in the last 72 hours. Thyroid Function Tests: Recent Labs    11/04/21 1808  TSH 3.577    Anemia Panel: No results for input(s): "VITAMINB12", "FOLATE", "FERRITIN", "TIBC", "IRON", "RETICCTPCT" in the last 72 hours.  Sepsis Labs: No results for input(s): "PROCALCITON", "LATICACIDVEN" in the last 168 hours.  Recent Results (from the past 240 hour(s))  Urine Culture     Status: Abnormal   Collection Time: 11/12/2021  3:23 PM   Specimen: In/Out Cath Urine  Result Value Ref Range Status   Specimen Description   Final    IN/OUT CATH URINE Performed at [facility] [address], [place], [place] [address]    Special Requests   Final    NONE Performed at [facility], [place] [address] [place], [place] [address]    Culture >=100,000 COLONIES/mL ENTEROBACTER AEROGENES (A)  Final   Report Status 11/03/2021 FINAL  Final   Organism ID, Bacteria ENTEROBACTER AEROGENES (A)  Final      Susceptibility   Enterobacter aerogenes - MIC*    CEFAZOLIN >=64 RESISTANT Resistant     CEFEPIME 1 SENSITIVE Sensitive     CEFTRIAXONE >=64 RESISTANT Resistant     CIPROFLOXACIN <=0.25 SENSITIVE Sensitive     GENTAMICIN <=1 SENSITIVE Sensitive     IMIPENEM 1 SENSITIVE Sensitive     NITROFURANTOIN 128 RESISTANT Resistant     TRIMETH/SULFA <=20 SENSITIVE Sensitive     PIP/TAZO >=128 RESISTANT Resistant     * >=100,000 COLONIES/mL ENTEROBACTER AEROGENES         Radiology Studies: MR BRAIN W WO CONTRAST  Result Date: 11/05/2021 CLINICAL DATA:  Delirium. Brain metastases. Assess treatment. Previous radiation therapy. EXAM: MRI HEAD WITHOUT AND WITH CONTRAST TECHNIQUE: Multiplanar, multiecho pulse sequences of the brain and surrounding structures were obtained without and with intravenous contrast. CONTRAST:  51m GADAVIST GADOBUTROL 1 MMOL/ML IV SOLN COMPARISON:  MR head without and with contrast 04/13/2021. CT head without and with contrast 11/04/2021. FINDINGS: Brain: T1 shortening is present in the treated right temporal lobe lesion. No significant residual enhancement is present. Progressive volume loss noted. No other focal enhancement is present to suggest residual recurrent metastatic disease. T2 hyperintensity is present in the white matter of the right temporal lobe. Progressive generalized atrophy and periventricular T2 hyperintensity is noted. The ventricles are proportionate to the degree of atrophy. No significant extraaxial fluid collection is present. Brainstem and cerebellum are within normal limits. Vascular: Flow is present in the major intracranial arteries. Skull and upper cervical spine: Craniocervical junction is normal. Flow is present in the vertebral arteries  bilaterally. Visualized intracranial contents are normal. Sinuses/Orbits: Chronic mastoid effusions are present. The paranasal sinuses and mastoid air cells are otherwise clear. The globes and orbits are within normal limits. IMPRESSION: 1. Sequelae of treated lesion in the right temporal lobe with progressive volume loss and T1 shortening. No definite residual tumor remains. 2. No evidence for residual or recurrent metastatic disease elsewhere in the  brain. 3. Progressive generalized volume loss and white matter disease likely reflects the sequela of radiation therapy. 4. Chronic mastoid effusions. Electronically Signed   By: San Morelle M.D.   On: 11/05/2021 20:13        Scheduled Meds:  enoxaparin (LOVENOX) injection  100 mg Subcutaneous BID   insulin aspart  0-9 Units Subcutaneous TID WC   pantoprazole (PROTONIX) IV  40 mg Intravenous Q12H   PARoxetine  40 mg Oral Daily   Continuous Infusions:  ciprofloxacin 400 mg (11/06/21 0144)   dextrose 50 mL/hr at 11/05/21 0850   levETIRAcetam 500 mg (11/06/21 0951)   thiamine injection 500 mg (11/06/21 1337)   valproate sodium 500 mg (11/06/21 0949)     LOS: 5 days    Time spent: 65 min  Georgette Shell, MD  11/06/2021, 1:39 PM

## 2021-11-06 NOTE — Plan of Care (Signed)
Neurology Plan of Care  Melissa Case MR# [id number] 11/06/2021  Discussed with primary team and patient was better while on steroids which were just stopped.  Recommended a short course of dexamethasone and observe for response.   Electronically signed by:  Lynnae Sandhoff, MD Page: [phone] 11/06/2021, 6:49 PM

## 2021-11-07 DIAGNOSIS — G9341 Metabolic encephalopathy: Secondary | ICD-10-CM | POA: Diagnosis not present

## 2021-11-07 DIAGNOSIS — R569 Unspecified convulsions: Secondary | ICD-10-CM | POA: Diagnosis not present

## 2021-11-07 DIAGNOSIS — Y842 Radiological procedure and radiotherapy as the cause of abnormal reaction of the patient, or of later complication, without mention of misadventure at the time of the procedure: Secondary | ICD-10-CM

## 2021-11-07 DIAGNOSIS — I6789 Other cerebrovascular disease: Secondary | ICD-10-CM

## 2021-11-07 DIAGNOSIS — N3000 Acute cystitis without hematuria: Secondary | ICD-10-CM | POA: Diagnosis not present

## 2021-11-07 LAB — CBC
HCT: 40.3 % (ref 36.0–46.0)
Hemoglobin: 12.9 g/dL (ref 12.0–15.0)
MCH: 31.5 pg (ref 26.0–34.0)
MCHC: 32 g/dL (ref 30.0–36.0)
MCV: 98.3 fL (ref 80.0–100.0)
Platelets: 124 10*3/uL — ABNORMAL LOW (ref 150–400)
RBC: 4.1 MIL/uL (ref 3.87–5.11)
RDW: 17 % — ABNORMAL HIGH (ref 11.5–15.5)
WBC: 7.9 10*3/uL (ref 4.0–10.5)
nRBC: 0 % (ref 0.0–0.2)

## 2021-11-07 LAB — BASIC METABOLIC PANEL
Anion gap: 9 (ref 5–15)
BUN: <5 mg/dL — ABNORMAL LOW (ref 6–20)
CO2: 26 mmol/L (ref 22–32)
Calcium: 7.7 mg/dL — ABNORMAL LOW (ref 8.9–10.3)
Chloride: 100 mmol/L (ref 98–111)
Creatinine, Ser: 0.31 mg/dL — ABNORMAL LOW (ref 0.44–1.00)
GFR, Estimated: >60 mL/min (ref >60)
Glucose, Bld: 141 mg/dL — ABNORMAL HIGH (ref 70–99)
Potassium: 5 mmol/L (ref 3.5–5.1)
Sodium: 135 mmol/L (ref 135–145)

## 2021-11-07 LAB — GLUCOSE, CAPILLARY
Glucose-Capillary: 138 mg/dL — ABNORMAL HIGH (ref 70–99)
Glucose-Capillary: 124 mg/dL — ABNORMAL HIGH (ref 70–99)
Glucose-Capillary: 144 mg/dL — ABNORMAL HIGH (ref 70–99)
Glucose-Capillary: 152 mg/dL — ABNORMAL HIGH (ref 70–99)

## 2021-11-07 NOTE — Progress Notes (Signed)
Subjective: Patient is seen in her room with her RN at the bedside.  Patient is nonverbal, and RN states that she has been responsive only to noxious stimuli for the past two days.  Patient will localize sternal rub and withdraw to noxious stimuli in extremities.  Plan to bring her to [facility] for LTM EGG to rule out seizure activity.  Exam: Vitals:   11/07/21 0342 11/07/21 1305  BP: 132/82 120/85  Pulse: 69 75  Resp: 18 (!) 23  Temp: 98.1 F (36.7 C) 98.8 F (37.1 C)  SpO2: 90% (!) 87%   Gen: In bed, NAD Resp: non-labored breathing, no acute distress Abd: soft, nt  Neuro: MS: Nonverbal, will not open eyes, will localize sternal rub, unable to follow commands CN: difficult to assess given mental status II: eyes closed but PERRL  Motor: Will withdraw BUE to noxious stimuli and will flicker BLE to noxious stimuli Sensory:Appears equally reactive in all four extremities DTR:2+ bicep and brachioradialis, unable to elicit patellar reflexes  Pertinent Labs: CBC    Component Value Date/Time   WBC 7.9 11/07/2021 0900   RBC 4.10 11/07/2021 0900   HGB 12.9 11/07/2021 0900   HGB 10.8 (L) 05/20/2021 1030   HCT 40.3 11/07/2021 0900   HCT 33.2 (L) 05/20/2021 1030   PLT 124 (L) 11/07/2021 0900   PLT 203 05/20/2021 1030   MCV 98.3 11/07/2021 0900   MCV 99 (H) 05/20/2021 1030   MCH 31.5 11/07/2021 0900   MCHC 32.0 11/07/2021 0900   RDW 17.0 (H) 11/07/2021 0900   RDW 14.6 05/20/2021 1030   LYMPHSABS 0.8 10/24/2021 1225   LYMPHSABS 2.5 07/23/2017 0820   MONOABS 1.2 (H) 11/17/2021 1225   EOSABS 0.0 10/21/2021 1225   EOSABS 0.2 07/23/2017 0820   BASOSABS 0.1 11/15/2021 1225   BASOSABS 0.1 07/23/2017 0820       Latest Ref Rng & Units 11/07/2021    9:26 AM 11/05/2021    7:37 AM 11/04/2021    9:35 AM  BMP  Glucose 70 - 99 mg/dL 141  103  88   BUN 6 - 20 mg/dL <5  <5  <5   Creatinine 0.44 - 1.00 mg/dL 0.31  0.41  0.39   Sodium 135 - 145 mmol/L 135  143  144   Potassium 3.5 - 5.1 mmol/L  5.0  3.4  3.4   Chloride 98 - 111 mmol/L 100  107  108   CO2 22 - 32 mmol/L 26  31  30    Calcium 8.9 - 10.3 mg/dL 7.7  8.3  8.2     Folate 8.9 B12 1,386   Impression: 61 year old patient with decreased responsiveness and encephalopathy. At baseline, she is able to ambulate and care for herself.  Her condition at home appeared to worsen when steroids were discontinued, so will continue decadron.  MRI brain shows sequelae of treated lesion in right temporal lobe with no tumor recurrence and no edema.  Patient does have a history of seizures, and last known seizure was 10/22.  She is on Keppra and Depakote.  Concern for nonconvulsive seizure activity.  Patient does have a UTI for which she is on ciprofloxacin.  She may have a component of hypoactive delirium as well.   Recommendations: 1)LTM EEG. Patient will need to come to [facility] 2)Continue Keppra and Depakote 3)Continue Decadron 4)Delirium precautions  5)Neurology will follow   Note to be edited by MD  Cortney E Carron Curie , MSN,  AGACNP-BC [facility] See Amion for schedule and pager information 11/07/2021 4:37 PM  Attending Neurohospitalist Addendum Patient seen and examined with APP/Resident. Agree with the history and physical as documented above. Agree with the plan as documented, which I helped formulate. I have edited the note above to reflect my full findings and recommendations. I have independently reviewed the chart, obtained history, review of systems and examined the patient.I have personally reviewed pertinent head/neck/spine imaging (CT/MRI). Please feel free to call with any questions.  Patient slightly improved on my examination later this afternoon but still had significant difficulty following commands and was oriented to first name only. 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 She is at high risk for nonconvulsive  seizures and will be transferred to [facility] for cEEG.  --  Su Monks, MD [facility] [phone]  If 7pm- 7am, please page neurology on call as listed in [place].

## 2021-11-07 NOTE — Progress Notes (Signed)
Pt Aox0 throughout shift, minimally responsive, attending and neuro MD aware.    During O2 spot check pt was found to be at 74% on 3L, increased to 6L which brought  pt up to 80%.  Placed pt on NRB to recover, alerted charge RN Baxter Flattery and rapid Therapist, sports.    With rapid RN at bedside, pt was placed on HFNC at 10L @ 90%.  Placed on continuous o2 monitor.  During this time, pt was alert, asked for coffee.  This lasted aprox. 58mbefore returning to previous minimally responsive state.   [facility] spoke to son at bedside and called husband RDelfino Lovettbut was unable to reach.  Son stated that this is a very similar presentation to previous seizure activity in 2022.  No active seizures observed.

## 2021-11-07 NOTE — TOC Progression Note (Signed)
Transition of Care [facility]) - Progression Note    Patient Details  Name: Melissa Case MRN: [id number] Date of Birth: [birth year]  [facility] of [facility] CM/SW Contact  Purcell Mouton, RN Phone Number: 11/07/2021, 3:58 PM  Clinical Narrative:     Pt is not ready for discharge. May transfer to [facility].         Expected Discharge Plan and Services                                                 Social Determinants of Health (SDOH) Interventions    Readmission Risk Interventions    10/14/2021    2:14 PM  Readmission Risk Prevention Plan  Transportation Screening Complete  Medication Review (RN Care Manager) Complete  PCP or Specialist appointment within 3-5 days of discharge Complete  HRI or Home Care Consult Complete  SW Recovery Care/Counseling Consult Complete  [place] Complete

## 2021-11-07 NOTE — Progress Notes (Signed)
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  According to her husband she has been confused not responding appropriately not acting as usual.  At baseline she is able to walk around do things on her own.  Felt that she was not eating or drinking and was dehydrated.  Patient had nausea vomiting and confusion at the time of admission.  This was thought to be due to urinary tract infection. She is on Keppra and Depakote for seizures.  Per husband her last seizure was in October 2022. She does have chronic venous stasis   She was admitted to the hospital last month 10/12/2021 through 10/19/2021 with acute on chronic diastolic CHF. She is on ns 50 cc hr was on lasix at home 40 mg bid  11/05/2021 patient was seen by neurology. Started on high-dose thiamine. MRI brain -Sequelae of treated lesion in the right temporal lobe with progressive volume loss and T1 shortening. No definite residual tumor remains. No evidence for residual or recurrent metastatic disease elsewhere in the brain. Progressive generalized volume loss and white matter disease likely reflects the sequela of radiation therapy. Chronic mastoid effusions  assessment & Plan:   Principal Problem:   UTI (urinary tract infection) Active Problems:   Generalized weakness   Cancer of parotid gland (HCC)   History of pulmonary embolus (PE)   Depression   Seizure (HCC)   DVT (deep venous thrombosis) (HCC)   Acute metabolic encephalopathy   Chronic diastolic CHF (congestive heart failure) (HCC)   Obesity, Class III, BMI 40-49.9 (morbid obesity) (HCC)   Nausea  and vomiting   Hypokalemia   #1 acute encephalopathy which is probably multifactorial-UTI/dehydration- Her UA was positive for leukocytes and nitrites with few bacteria and more than 50 WBCs.  She was started on Rocephin. Urine culture with gram-negative rods resistant to Rocephin and sensitive to cefepime.  Cefepime started 11/03/2021.  And blood cultures pending negative so far.  Cefepime was stopped 11/04/2021 and Cipro was started.  This was done due to ongoing encephalopathy and not to worsen it with neurotoxicity from cefepime. UA is also positive for ketones, on Lasix at home. Change fluids to D5 50 cc an hour ongoing since admission. Patient has not improved with the above treatments. Ammonia level 37 Eeg no active seizures  ABG on admission 7.4 6/54/44 Depakote level normal CT head shows changes consistent with postradiation but nothing acute Cortisol level normal MRI brain as above Decadron started by neuro 11/06/2021 Appreciate neurology input.  #2 nausea vomiting -resolved start clear liquid diet.    #3 hypokalemia-potassium 3.4 replete IV Mag 2.0  #4 history of seizure disorder per husband her last seizure was in October 2022.  Patient has history of brain mets from parotid gland malignancy.  Continue Keppra and Depakote IV.  #5 history of DVT and PE unable to take Eliquis p.o. due to change in mental status, we will change to Lovenox  #6 chronic diastolic heart failure on Lasix at home which has been on hold due to no p.o. intake and dehydration on ivf 50 cc an hour monitor closely  #7 depression Home meds on hold  #8  chronic venous stasis dermatitis seen by wound care no evidence of open wounds     Estimated body mass index is 40.42 kg/m as calculated from the following:   Height as of this encounter: '5\' 4"'$  (1.626 m).   Weight as of this encounter: 106.8 kg.  DVT prophylaxis: Lovenox Code Status: Full code Family Communication: Discussed with husband at  bedside Disposition Plan:  Status is: Inpatient    Consultants:  Neurology  Procedures: None  antimicrobials: Rocephin  Subjective: She is resting in bed husband is also by the bedside  She follows him with her eyes and she follows me with her eyes Not speaking much today Has not been eating since she is not safely awake enough to swallow food Objective: Vitals:   11/06/21 2108 11/07/21 0342 11/07/21 0406 11/07/21 1305  BP: (!) 117/95 132/82  120/85  Pulse: 83 69  75  Resp: (!) 24 18  (!) 23  Temp: 98.4 F (36.9 C) 98.1 F (36.7 C)  98.8 F (37.1 C)  TempSrc:    Oral  SpO2: (!) 89% 90%  (!) 87%  Weight:   106.8 kg   Height:        Intake/Output Summary (Last 24 hours) at 11/07/2021 1433 Last data filed at 11/07/2021 1610 Gross per 24 hour  Intake 1050 ml  Output 750 ml  Net 300 ml    Filed Weights   11/05/21 0553 11/06/21 0500 11/07/21 0406  Weight: 105.2 kg 105.6 kg 106.8 kg    Examination:  General exam: Appears chronically ill-appearing restless confused  respiratory system: Clear to auscultation. Respiratory effort normal. Cardiovascular system: S1 & S2 heard, RRR. No JVD, murmurs, rubs, gallops or clicks. No pedal edema. Gastrointestinal system: Abdomen is nondistended, soft and nontender. No organomegaly or masses felt. Normal bowel sounds heard. Central nervous system: Restless confused moves all extremities  extremities: No edema Skin: Chronic venous stasis ulcers Psychiatry: Unable to assess   Data Reviewed: I have personally reviewed following labs and imaging studies  CBC: Recent Labs  Lab 11/10/2021 1225 11/02/21 0423 11/03/21 0413 11/05/21 0737 11/07/21 0900  WBC 6.1 6.7 6.3 5.4 7.9  NEUTROABS 3.5  --   --   --   --   HGB 13.1 12.0 10.7* 12.2 12.9  HCT 42.0 37.8 35.2* 39.2 40.3  MCV 100.7* 101.1* 103.8* 103.4* 98.3  PLT 250 228 201 155 124*    Basic Metabolic Panel: Recent Labs  Lab 11/02/21 0423 11/03/21 0413 11/03/21 1024  11/04/21 0935 11/05/21 0737 11/07/21 0926  NA 145 147*  --  144 143 135  K 2.7* 3.1*  --  3.4* 3.4* 5.0  CL 102 108  --  108 107 100  CO2 33* 30  --  '30 31 26  '$ GLUCOSE 86 71  --  88 103* 141*  BUN 8 <5*  --  <5* <5* <5*  CREATININE 0.51 0.50  --  0.39* 0.41* 0.31*  CALCIUM 8.6* 8.1*  --  8.2* 8.3* 7.7*  MG  --  2.0 2.0  --   --   --     GFR: Estimated Creatinine Clearance: 89.1 mL/min (A) (by C-G formula based on SCr of 0.31 mg/dL (L)). Liver Function Tests: Recent Labs  Lab 11/08/2021 1225 11/02/21 0423 11/03/21 0413 11/05/21 0737  AST 52* 49* 43* 35  ALT '31 29 25 21  '$ ALKPHOS 95 95 79 83  BILITOT 0.7 0.8 0.8 0.5  PROT 5.2* 5.2* 4.6* 4.9*  ALBUMIN 2.2* 2.3*  1.9* 1.9*    No results for input(s): "LIPASE", "AMYLASE" in the last 168 hours. Recent Labs  Lab 10/28/2021 1225 11/02/21 1033  AMMONIA 37* 25    Coagulation Profile: No results for input(s): "INR", "PROTIME" in the last 168 hours. Cardiac Enzymes: No results for input(s): "CKTOTAL", "CKMB", "CKMBINDEX", "TROPONINI" in the last 168 hours. BNP (last 3 results) Recent Labs    08/12/21 1009  PROBNP 28.0    HbA1C: Recent Labs    11/05/21 0819  HGBA1C 5.9*    CBG: Recent Labs  Lab 11/06/21 1148 11/06/21 1615 11/06/21 2209 11/07/21 0749 11/07/21 1237  GLUCAP 94 97 120* 138* 124*    Lipid Profile: No results for input(s): "CHOL", "HDL", "LDLCALC", "TRIG", "CHOLHDL", "LDLDIRECT" in the last 72 hours. Thyroid Function Tests: Recent Labs    11/04/21 1808  TSH 3.577    Anemia Panel: No results for input(s): "VITAMINB12", "FOLATE", "FERRITIN", "TIBC", "IRON", "RETICCTPCT" in the last 72 hours.  Sepsis Labs: No results for input(s): "PROCALCITON", "LATICACIDVEN" in the last 168 hours.  Recent Results (from the past 240 hour(s))  Urine Culture     Status: Abnormal   Collection Time: 11/04/2021  3:23 PM   Specimen: In/Out Cath Urine  Result Value Ref Range Status   Specimen Description   Final     IN/OUT CATH URINE Performed at [facility] [address] [place], [place] [address]    Special Requests   Final    NONE Performed at [facility], [place] [address], [place], [place] [address]    Culture >=100,000 COLONIES/mL ENTEROBACTER AEROGENES (A)  Final   Report Status 11/03/2021 FINAL  Final   Organism ID, Bacteria ENTEROBACTER AEROGENES (A)  Final      Susceptibility   Enterobacter aerogenes - MIC*    CEFAZOLIN >=64 RESISTANT Resistant     CEFEPIME 1 SENSITIVE Sensitive     CEFTRIAXONE >=64 RESISTANT Resistant     CIPROFLOXACIN <=0.25 SENSITIVE Sensitive     GENTAMICIN <=1 SENSITIVE Sensitive     IMIPENEM 1 SENSITIVE Sensitive     NITROFURANTOIN 128 RESISTANT Resistant     TRIMETH/SULFA <=20 SENSITIVE Sensitive     PIP/TAZO >=128 RESISTANT Resistant     * >=100,000 COLONIES/mL ENTEROBACTER AEROGENES         Radiology Studies: MR BRAIN W WO CONTRAST  Result Date: 11/05/2021 CLINICAL DATA:  Delirium. Brain metastases. Assess treatment. Previous radiation therapy. EXAM: MRI HEAD WITHOUT AND WITH CONTRAST TECHNIQUE: Multiplanar, multiecho pulse sequences of the brain and surrounding structures were obtained without and with intravenous contrast. CONTRAST:  90m GADAVIST GADOBUTROL 1 MMOL/ML IV SOLN COMPARISON:  MR head without and with contrast 04/13/2021. CT head without and with contrast 11/04/2021. FINDINGS: Brain: T1 shortening is present in the treated right temporal lobe lesion. No significant residual enhancement is present. Progressive volume loss noted. No other focal enhancement is present to suggest residual recurrent metastatic disease. T2 hyperintensity is present in the white matter of the right temporal lobe. Progressive generalized atrophy and periventricular T2 hyperintensity is noted. The ventricles are proportionate to the degree of atrophy. No significant extraaxial fluid collection is present. Brainstem and cerebellum  are within normal limits. Vascular: Flow is present in the major intracranial arteries. Skull and upper cervical spine: Craniocervical junction is normal. Flow is present in the vertebral arteries bilaterally. Visualized intracranial contents are normal. Sinuses/Orbits: Chronic mastoid effusions are present. The paranasal sinuses and mastoid air cells are otherwise clear. The globes and  orbits are within normal limits. IMPRESSION: 1. Sequelae of treated lesion in the right temporal lobe with progressive volume loss and T1 shortening. No definite residual tumor remains. 2. No evidence for residual or recurrent metastatic disease elsewhere in the brain. 3. Progressive generalized volume loss and white matter disease likely reflects the sequela of radiation therapy. 4. Chronic mastoid effusions. Electronically Signed   By: [place] Morelle M.D.   On: 11/05/2021 20:13        Scheduled Meds:  dexamethasone (DECADRON) injection  4 mg Intravenous Q8H   enoxaparin (LOVENOX) injection  100 mg Subcutaneous BID   insulin aspart  0-9 Units Subcutaneous TID WC   pantoprazole (PROTONIX) IV  40 mg Intravenous Q12H   PARoxetine  40 mg Oral Daily   Continuous Infusions:  ciprofloxacin 400 mg (11/07/21 1341)   dextrose 50 mL/hr at 11/05/21 0850   levETIRAcetam 500 mg (11/07/21 0917)   thiamine injection 500 mg (11/07/21 1340)   valproate sodium 500 mg (11/07/21 0915)     LOS: 6 days    Time spent: 34 min  Georgette Shell, MD  11/07/2021, 2:33 PM

## 2021-11-07 NOTE — Progress Notes (Addendum)
Physical Therapy Treatment Patient Details Name: Melissa Case MRN: [id number] DOB: Feb 03, 1961 Today's Date: 11/07/2021   History of Present Illness Patient is 61 y.o. female presented this time to the ED 11/02/2011 with  increased confusion, lethargy, Nausea and vomiting. She had a recent admission with worsening venous stasis ulcers as well as increased redness in the lower extremities, now with Bilateral unna boots.   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    PT Comments    SIGNIFICANT MOBILITY DECLINE Per PT noted 2 weeks ago, pt was amb 20 feet.  Today, she is bedridden and lethargic. Per RN, pt is not taking anything by mouth (meds/food/liquids)  General Comments: pt lethargic only responding to pain with all bed mobility, eyes shut 99% with a brief verbal response when asked her to open her mouth and stick her tongue out.  Which she partially did.  Mouth dry/no teeth. I asked her "where's your teeth?".  Pt mumbled "I don't have any teeth".  Then unable to answer more questions, she just repeated "I don't have any teeth".  Then reverted back to Verbally non responsive.  Assisted to EOB was VERY difficult.  General bed mobility comments: Pt in bed on 2 lts verbally non responsive.  Faint moan with sternal rub.  Eyes shut.  Assisted to EOB Total Asisst + 2 pt 0% to attempt responsive reaction.  Used bed pad to complete "Holicopter Swival" sitting EOB.  Once upright, pt was able to static sit at [facility] but with poor collpsed posture.  Level od alertness remained mostly lethargic with only a few words spoken.  Eyes remained closed.  Pt present with MAX edema throughout.  B LE wrapped.  Everything above is very swallon (thighs/ABD/B UE's.  Pt sat EOB 2 min with assist and remained mostly lethargic.  Assisted back  to supine as well as side to side rolling due to incont loose BM.  With RN and NT, assisted with peri care as well as full bed change.  Pt still only moaning with pain during this activity. Positioned sidelying to comfort.      Recommendations for follow up therapy are one component of a multi-disciplinary discharge planning process, led by the attending physician.  Recommendations may be updated based on patient status, additional functional criteria and insurance authorization.  Follow Up Recommendations  Skilled nursing-short term rehab (<3 hours/day)     Assistance Recommended at Discharge Frequent or constant Supervision/Assistance  Patient can return home with the following Assist for transportation;Help with stairs or ramp for entrance;Two people to help with bathing/dressing/bathroom;Two people to help with walking and/or transfers;Assistance with feeding;Assistance with cooking/housework;Other (comment)   Equipment Recommendations       Recommendations for Other Services       Precautions / Restrictions Precautions Precautions: Fall Precaution Comments: Hx R Parotic CA (R Facial Drop) 2021 with Brain METS     Mobility  Bed Mobility Overal bed mobility: Needs Assistance Bed Mobility: Rolling, Supine to Sit, Sit to Supine Rolling: Total assist, +2 for physical assistance (pt 0%)   Supine to sit: Total assist, +2 for physical assistance (pt 0%) Sit to supine: Total assist, +2 for physical assistance (pt 0%)   General bed mobility comments: Pt in bed on 2 lts verbally non responsive.  Faint moan with sternal rub.  Eyes shut.  Assisted to  EOB Total Asisst + 2 pt 0% to attempt responsive reaction.  Used bed pad to complete "Holicopter Swival" sitting EOB.  Once upright, pt was able to static sit at [facility] but with poor collpsed posture.  Level od alertness remained mostly lethargic with only a few words spoken.  Eyes remained closed.  Pt present with MAX edema  throughout.  B LE wrapped.  Everything above is very swallon (thighs/ABD/B UE's.  Pt sat EOB 2 min with assist and remained mostly lethargic.  Assisted back to supine as well as side to side rolling due to incont loose BM.  With RN and NT, assisted with peri care as well as full bed change.  Pt still only moaning with pain during this activity.    Transfers                   General transfer comment: unable to attempt due to lethargy    Ambulation/Gait                   Stairs             Wheelchair Mobility    Modified Rankin (Stroke Patients Only)       Balance                                            Cognition Arousal/Alertness: Lethargic   Overall Cognitive Status: Impaired/Different from baseline                                 General Comments: pt lethargic only responding to pain with all bed mobility, eyes shut 99% with a brief verbal responce when asked her open her mouth and stick her tongue out.  I asked her "where's your teeth?".  Pt mumbled "I don't have any teeth".  Then unable to answer more questions, she just repeated "I don't have any teeth".  Verbally non responsive.        Exercises      General Comments        Pertinent Vitals/Pain Pain Assessment Pain Assessment: Faces Pain Location: grimacing/moaning of pain with all bed activity unable to express location Pain Descriptors / Indicators: Grimacing, Moaning Pain Intervention(s): Monitored during session    Home Living                          Prior Function            PT Goals (current goals can now be found in the care plan section) Progress towards PT goals: Progressing toward goals    Frequency    Min 3X/week      PT Plan Frequency needs to be updated;Discharge plan needs to be updated    Co-evaluation              AM-PAC PT "6 Clicks" Mobility   Outcome Measure  Help needed turning from your back to  your side while in a flat bed without using bedrails?: Total Help needed moving from lying on your back to sitting on the side of a flat bed without using bedrails?: Total Help needed moving to and from a bed to a chair (including a wheelchair)?: Total Help needed standing up from a chair using your arms (e.g., wheelchair  or bedside chair)?: Total Help needed to walk in hospital room?: Total Help needed climbing 3-5 steps with a railing? : Total 6 Click Score: 6    End of Session Equipment Utilized During Treatment: Oxygen Activity Tolerance: Patient limited by lethargy Patient left: in bed;with call bell/phone within reach;with nursing/sitter in room;with bed alarm set Nurse Communication: Mobility status;Need for lift equipment PT Visit Diagnosis: Muscle weakness (generalized) (M62.81);Other abnormalities of gait and mobility (R26.89)     Time: 1012-1030 PT Time Calculation (min) (ACUTE ONLY): 18 min  Charges:  $Therapeutic Activity: 8-22 mins                     Rica Koyanagi  PTA Acute  Rehabilitation Services Pager      [phone] Office      [phone]

## 2021-11-07 NOTE — Significant Event (Signed)
Rapid Response Event Note   Reason for Call :  Hypoxia   Initial Focused Assessment:  Patient in bed on NRB. Per bedside RN patient was okay on 4 L [place] but then had desat event to mid 80s without recovery. After NRB placed O2 increased to 99%.   Neuro- Alert to self. Patient respond to voice easily and tracked RN during assessment. Per bedside RN this was the most alert the patient had been all day. Attempted to squeeze hands.  Respiratory- Placed on HFNC 10 L- SpO2 94% after placement. No respiratory distress. Lungs diminished bilaterally. No wheezing or crackles. RR 17-21/min.    Interventions:  Titrate down O2 as able. On 10 L now after NRB removed  Event Summary:   MD Notified: prior to arrival  Call Time: 1631 Arrival Time: 1640 End Time: [place], RN

## 2021-11-08 ENCOUNTER — Inpatient Hospital Stay (HOSPITAL_COMMUNITY): Payer: BC Managed Care – PPO

## 2021-11-08 DIAGNOSIS — N3000 Acute cystitis without hematuria: Secondary | ICD-10-CM | POA: Diagnosis not present

## 2021-11-08 LAB — CBC
HCT: 36.9 % (ref 36.0–46.0)
Hemoglobin: 11.3 g/dL — ABNORMAL LOW (ref 12.0–15.0)
MCH: 30.8 pg (ref 26.0–34.0)
MCHC: 30.6 g/dL (ref 30.0–36.0)
MCV: 100.5 fL — ABNORMAL HIGH (ref 80.0–100.0)
Platelets: 109 10*3/uL — ABNORMAL LOW (ref 150–400)
RBC: 3.67 MIL/uL — ABNORMAL LOW (ref 3.87–5.11)
RDW: 17.2 % — ABNORMAL HIGH (ref 11.5–15.5)
WBC: 7.6 10*3/uL (ref 4.0–10.5)
nRBC: 0 % (ref 0.0–0.2)

## 2021-11-08 LAB — VITAMIN B1: Vitamin B1 (Thiamine): 82 nmol/L (ref 66.5–200.0)

## 2021-11-08 LAB — GLUCOSE, CAPILLARY
Glucose-Capillary: 117 mg/dL — ABNORMAL HIGH (ref 70–99)
Glucose-Capillary: 165 mg/dL — ABNORMAL HIGH (ref 70–99)
Glucose-Capillary: 96 mg/dL (ref 70–99)
Glucose-Capillary: 145 mg/dL — ABNORMAL HIGH (ref 70–99)

## 2021-11-08 LAB — BRAIN NATRIURETIC PEPTIDE: B Natriuretic Peptide: 89.7 pg/mL (ref 0.0–100.0)

## 2021-11-08 MED ORDER — ALBUMIN HUMAN 25 % IV SOLN
25.0000 g | Freq: Four times a day (QID) | INTRAVENOUS | Status: DC
  Administered 2021-11-08: 25 g via INTRAVENOUS
  Filled 2021-11-08 (×3): qty 100

## 2021-11-08 MED ORDER — PROSOURCE PLUS PO LIQD
30.0000 mL | Freq: Two times a day (BID) | ORAL | Status: DC
  Administered 2021-11-09 – 2021-11-11 (×6): 30 mL via ORAL
  Filled 2021-11-08 (×5): qty 30

## 2021-11-08 MED ORDER — FUROSEMIDE 10 MG/ML IJ SOLN
40.0000 mg | Freq: Once | INTRAMUSCULAR | Status: AC
  Administered 2021-11-08: 40 mg via INTRAVENOUS
  Filled 2021-11-08: qty 4

## 2021-11-08 MED ORDER — FUROSEMIDE 10 MG/ML IJ SOLN
40.0000 mg | Freq: Once | INTRAMUSCULAR | Status: AC
Start: 2021-11-08 — End: 2021-11-08
  Administered 2021-11-08: 40 mg via INTRAVENOUS
  Filled 2021-11-08: qty 4

## 2021-11-08 MED ORDER — SODIUM CHLORIDE 0.9 % IV SOLN
750.0000 mg | Freq: Two times a day (BID) | INTRAVENOUS | Status: DC
Start: 2021-11-08 — End: 2021-11-10
  Administered 2021-11-08 – 2021-11-10 (×5): 750 mg via INTRAVENOUS
  Filled 2021-11-08 (×8): qty 7.5

## 2021-11-08 MED ORDER — VALPROATE SODIUM 100 MG/ML IV SOLN
500.0000 mg | Freq: Three times a day (TID) | INTRAVENOUS | Status: DC
  Administered 2021-11-08 – 2021-11-14 (×17): 500 mg via INTRAVENOUS
  Filled 2021-11-08 (×22): qty 5

## 2021-11-08 MED ORDER — BOOST PLUS PO LIQD
237.0000 mL | Freq: Three times a day (TID) | ORAL | Status: DC
  Administered 2021-11-09 – 2021-11-11 (×4): 237 mL via ORAL
  Filled 2021-11-08 (×12): qty 237

## 2021-11-08 MED ORDER — SODIUM CHLORIDE 0.9% FLUSH
10.0000 mL | Freq: Two times a day (BID) | INTRAVENOUS | Status: DC
  Administered 2021-11-08 – 2021-11-13 (×8): 10 mL

## 2021-11-08 MED ORDER — CHLORHEXIDINE GLUCONATE CLOTH 2 % EX PADS
6.0000 | MEDICATED_PAD | Freq: Every day | CUTANEOUS | Status: DC
  Administered 2021-11-09 – 2021-11-14 (×3): 6 via TOPICAL

## 2021-11-08 NOTE — Plan of Care (Signed)
Neurology plan of care  Patient awaiting transfer to [facility] for cEEG, which is not available at [facility]. In the meantime we have increased her keppra to '750mg'$  q 12 hrs and continued VPA '500mg'$  q 8 and decadron '4mg'$  q 8. (All IV). Patient should be hooked up to continuous EEG on arrival to [facility].  Su Monks, MD [facility] [phone]  If 7pm- 7am, please page neurology on call as listed in [place].

## 2021-11-08 NOTE — Progress Notes (Addendum)
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  According to her husband she has been confused not responding appropriately not acting as usual.  At baseline she is able to walk around do things on her own.  Felt that she was not eating or drinking and was dehydrated.  Patient had nausea vomiting and confusion at the time of admission.  This was thought to be due to urinary tract infection. She is on Keppra and Depakote for seizures.  Per husband her last seizure was in October 2022. She does have chronic venous stasis   She was admitted to the hospital last month 10/12/2021 through 10/19/2021 with acute on chronic diastolic CHF. She is on ns 50 cc hr was on lasix at home 40 mg bid  11/05/2021 patient was seen by neurology. Started on high-dose thiamine without any improvement Decadron started 11/06/2021 without any improvement MRI brain -Sequelae of treated lesion in the right temporal lobe with progressive volume loss and T1 shortening. No definite residual tumor remains. No evidence for residual or recurrent metastatic disease elsewhere in the brain. Progressive generalized volume loss and white matter disease likely reflects the sequela of radiation therapy. Chronic mastoid effusions  11/08/2021 patient remains minimally responsive.  Did not respond to verbal stimuli today.  Does not open her eyes when I called her name.   assessment & Plan:   Principal Problem:   UTI (urinary tract infection) Active Problems:   Generalized weakness   Cancer of parotid gland (HCC)   History of pulmonary embolus (PE)    Depression   Seizure (HCC)   DVT (deep venous thrombosis) (HCC)   Acute metabolic encephalopathy   Chronic diastolic CHF (congestive heart failure) (HCC)   Obesity, Class III, BMI 40-49.9 (morbid obesity) (HCC)   Nausea and vomiting   Hypokalemia   #1 acute encephalopathy -likely secondary to seizures  this was INITIALLY  thought to be related to urinary tract infection which was being treated for the last week or so without any improvement in her mental status.   Patient has history of seizures and is on Keppra and Depakote at home.  MRI of the brain shows post radiation changes to her right temporal brain.  Concern for subclinical seizures nonepileptic seizures were high.  Keppra dose was increased and Depakote being continued.  Patient to be transferred to [facility] for LTM EEG She has history of brain mets from parotid gland malignant tumor for which she had radiation and surgery.  Per husband her last seizure was in October 2022. Ammonia level 37 Eeg no active seizures  ABG on admission 7.4 6/54/44 Depakote level normal CT head shows changes consistent with postradiation but nothing acute Cortisol level normal MRI brain radiation changes to her right temporal lobe Decadron started by neuro 11/06/2021 Appreciate neurology input.  #2 enterococcal UTI finished course of treatment with ciprofloxacin.  #5 history of DVT and PE unable to take Eliquis p.o. due to change in mental status, continue Lovenox  #6  Pulmonary edema -patient received Lasix 40 mg x 1 today her blood pressure is soft she put out 700 cc of urine after the Lasix.  Reevaluate in a.m. and order Lasix as needed.  She was on fluids 50 cc an hour which was stopped on 11/08/2021.  Chest x-ray 11/08/2021 with pulmonary edema Echo 09/2021 with normal ejection fraction and grade 1 diastolic dysfunction Her labs are still pending as of 4:22 PM. Albumin infusion ordered for soft BP and need for more Lasix. Albumin 1.9.  #7  depression Home meds on hold  #8 chronic venous stasis dermatitis seen by wound care no evidence of open wounds   Estimated body mass index is 41.89 kg/m as calculated from the following:   Height as of this encounter: '5\' 4"'$  (1.626 m).   Weight as of this encounter: 110.7 kg.  DVT prophylaxis: Lovenox Code Status: Full code Family Communication: Discussed with husband at bedside Disposition Plan:  Status is: Inpatient    Consultants:  Neurology  Procedures: None  antimicrobials: Rocephin  Subjective: Patient does not respond to verbal or tactile stimuli.  Overnight patient had an episode of hypoxia and had to be placed on 6 L of oxygen. She also had a 30-minute.  Where she was awake and was making sense and asked for coffee.  Objective: Vitals:   11/07/21 2128 11/08/21 0500 11/08/21 0501 11/08/21 1241  BP: 139/90  137/69 112/62  Pulse: 72  70 74  Resp: '16  18 18  '$ Temp: 97.7 F (36.5 C)  98.3 F (36.8 C) 98.2 F (36.8 C)  TempSrc: Oral  Oral Axillary  SpO2: 99%  98% 93%  Weight:  110.7 kg    Height:        Intake/Output Summary (Last 24 hours) at 11/08/2021 1252 Last data filed at 11/08/2021 1241 Gross per 24 hour  Intake 0 ml  Output 2725 ml  Net -2725 ml    Filed Weights   11/06/21 0500 11/07/21 0406 11/08/21 0500  Weight: 105.6 kg 106.8 kg 110.7 kg    Examination:  General exam: Appears chronically ill-appearing restless confused  respiratory system: Coarse crackles To auscultation. Respiratory effort normal. Cardiovascular system: S1 & S2 heard, RRR. No JVD, murmurs, rubs, gallops or clicks. No pedal edema. Gastrointestinal system: Abdomen is distended, soft and nontender. No organomegaly or masses felt. Normal bowel sounds heard. Central nervous system: Restless confused moves all extremities  extremities: Unna boots on Skin: Chronic venous stasis ulcers Psychiatry: Unable to assess   Data Reviewed: I have personally reviewed following labs and  imaging studies  CBC: Recent Labs  Lab 11/02/21 0423 11/03/21 0413 11/05/21 0737 11/07/21 0900  WBC 6.7 6.3 5.4 7.9  HGB 12.0 10.7* 12.2 12.9  HCT 37.8 35.2* 39.2 40.3  MCV 101.1* 103.8* 103.4* 98.3  PLT 228 201 155 124*    Basic Metabolic Panel: Recent Labs  Lab 11/02/21 0423 11/03/21 0413 11/03/21 1024 11/04/21 0935 11/05/21 0737 11/07/21 0926  NA 145 147*  --  144 143 135  K 2.7* 3.1*  --  3.4* 3.4* 5.0  CL 102 108  --  108 107 100  CO2 33* 30  --  '30 31 26  '$ GLUCOSE 86 71  --  88 103* 141*  BUN 8 <5*  --  <5* <5* <5*  CREATININE 0.51 0.50  --  0.39* 0.41* 0.31*  CALCIUM 8.6* 8.1*  --  8.2* 8.3* 7.7*  MG  --  2.0 2.0  --   --   --     GFR: Estimated Creatinine Clearance: 91 mL/min (A) (by C-G formula based on SCr of 0.31 mg/dL (L)). Liver Function  Tests: Recent Labs  Lab 11/02/21 0423 11/03/21 0413 11/05/21 0737  AST 49* 43* 35  ALT '29 25 21  '$ ALKPHOS 95 79 83  BILITOT 0.8 0.8 0.5  PROT 5.2* 4.6* 4.9*  ALBUMIN 2.3* 1.9* 1.9*    No results for input(s): "LIPASE", "AMYLASE" in the last 168 hours. Recent Labs  Lab 11/02/21 1033  AMMONIA 25    Coagulation Profile: No results for input(s): "INR", "PROTIME" in the last 168 hours. Cardiac Enzymes: No results for input(s): "CKTOTAL", "CKMB", "CKMBINDEX", "TROPONINI" in the last 168 hours. BNP (last 3 results) Recent Labs    08/12/21 1009  PROBNP 28.0    HbA1C: No results for input(s): "HGBA1C" in the last 72 hours.  CBG: Recent Labs  Lab 11/07/21 1237 11/07/21 1704 11/07/21 2130 11/08/21 0754 11/08/21 1124  GLUCAP 124* 144* 152* 117* 165*    Lipid Profile: No results for input(s): "CHOL", "HDL", "LDLCALC", "TRIG", "CHOLHDL", "LDLDIRECT" in the last 72 hours. Thyroid Function Tests: No results for input(s): "TSH", "T4TOTAL", "FREET4", "T3FREE", "THYROIDAB" in the last 72 hours.  Anemia Panel: No results for input(s): "VITAMINB12", "FOLATE", "FERRITIN", "TIBC", "IRON", "RETICCTPCT" in  the last 72 hours.  Sepsis Labs: No results for input(s): "PROCALCITON", "LATICACIDVEN" in the last 168 hours.  Recent Results (from the past 240 hour(s))  Urine Culture     Status: Abnormal   Collection Time: 10/22/2021  3:23 PM   Specimen: In/Out Cath Urine  Result Value Ref Range Status   Specimen Description   Final    IN/OUT CATH URINE Performed at [facility] [address], [place], [place] [address]    Special Requests   Final    NONE Performed at [facility], [place] [address], [place], [place] [address]    Culture >=100,000 COLONIES/mL ENTEROBACTER AEROGENES (A)  Final   Report Status 11/03/2021 FINAL  Final   Organism ID, Bacteria ENTEROBACTER AEROGENES (A)  Final      Susceptibility   Enterobacter aerogenes - MIC*    CEFAZOLIN >=64 RESISTANT Resistant     CEFEPIME 1 SENSITIVE Sensitive     CEFTRIAXONE >=64 RESISTANT Resistant     CIPROFLOXACIN <=0.25 SENSITIVE Sensitive     GENTAMICIN <=1 SENSITIVE Sensitive     IMIPENEM 1 SENSITIVE Sensitive     NITROFURANTOIN 128 RESISTANT Resistant     TRIMETH/SULFA <=20 SENSITIVE Sensitive     PIP/TAZO >=128 RESISTANT Resistant     * >=100,000 COLONIES/mL ENTEROBACTER AEROGENES         Radiology Studies: No results found.      Scheduled Meds:  dexamethasone (DECADRON) injection  4 mg Intravenous Q8H   enoxaparin (LOVENOX) injection  100 mg Subcutaneous BID   insulin aspart  0-9 Units Subcutaneous TID WC   pantoprazole (PROTONIX) IV  40 mg Intravenous Q12H   PARoxetine  40 mg Oral Daily   Continuous Infusions:  dextrose 50 mL/hr at 11/08/21 0611   levETIRAcetam 750 mg (11/08/21 0939)   valproate sodium 500 mg (11/08/21 0938)     LOS: 7 days    Time spent: 4 min  Georgette Shell, MD  11/08/2021, 12:52 PM

## 2021-11-09 ENCOUNTER — Inpatient Hospital Stay (HOSPITAL_COMMUNITY): Payer: BC Managed Care – PPO

## 2021-11-09 DIAGNOSIS — Z86711 Personal history of pulmonary embolism: Secondary | ICD-10-CM

## 2021-11-09 DIAGNOSIS — G9341 Metabolic encephalopathy: Secondary | ICD-10-CM | POA: Diagnosis not present

## 2021-11-09 DIAGNOSIS — R569 Unspecified convulsions: Secondary | ICD-10-CM | POA: Diagnosis not present

## 2021-11-09 DIAGNOSIS — R531 Weakness: Secondary | ICD-10-CM

## 2021-11-09 DIAGNOSIS — N3000 Acute cystitis without hematuria: Secondary | ICD-10-CM | POA: Diagnosis not present

## 2021-11-09 LAB — CBC WITH DIFFERENTIAL/PLATELET
Abs Immature Granulocytes: 0 10*3/uL (ref 0.00–0.07)
Basophils Absolute: 0 10*3/uL (ref 0.0–0.1)
Basophils Relative: 0 %
Eosinophils Absolute: 0 10*3/uL (ref 0.0–0.5)
Eosinophils Relative: 0 %
HCT: 35.4 % — ABNORMAL LOW (ref 36.0–46.0)
Hemoglobin: 11.3 g/dL — ABNORMAL LOW (ref 12.0–15.0)
Lymphocytes Relative: 6 %
Lymphs Abs: 0.4 10*3/uL — ABNORMAL LOW (ref 0.7–4.0)
MCH: 31.3 pg (ref 26.0–34.0)
MCHC: 31.9 g/dL (ref 30.0–36.0)
MCV: 98.1 fL (ref 80.0–100.0)
Monocytes Absolute: 0.7 10*3/uL (ref 0.1–1.0)
Monocytes Relative: 12 %
Neutro Abs: 5 10*3/uL (ref 1.7–7.7)
Neutrophils Relative %: 82 %
Platelets: 122 10*3/uL — ABNORMAL LOW (ref 150–400)
RBC: 3.61 MIL/uL — ABNORMAL LOW (ref 3.87–5.11)
RDW: 16.7 % — ABNORMAL HIGH (ref 11.5–15.5)
WBC: 6.1 10*3/uL (ref 4.0–10.5)
nRBC: 0 % (ref 0.0–0.2)
nRBC: 0 /100 WBC

## 2021-11-09 LAB — GLUCOSE, CAPILLARY
Glucose-Capillary: 114 mg/dL — ABNORMAL HIGH (ref 70–99)
Glucose-Capillary: 113 mg/dL — ABNORMAL HIGH (ref 70–99)
Glucose-Capillary: 110 mg/dL — ABNORMAL HIGH (ref 70–99)
Glucose-Capillary: 101 mg/dL — ABNORMAL HIGH (ref 70–99)

## 2021-11-09 LAB — COMPREHENSIVE METABOLIC PANEL
ALT: 16 U/L (ref 0–44)
AST: 33 U/L (ref 15–41)
Albumin: 2.1 g/dL — ABNORMAL LOW (ref 3.5–5.0)
Alkaline Phosphatase: 82 U/L (ref 38–126)
Anion gap: 6 (ref 5–15)
BUN: <5 mg/dL — ABNORMAL LOW (ref 6–20)
CO2: 34 mmol/L — ABNORMAL HIGH (ref 22–32)
Calcium: 8.1 mg/dL — ABNORMAL LOW (ref 8.9–10.3)
Chloride: 101 mmol/L (ref 98–111)
Creatinine, Ser: 0.36 mg/dL — ABNORMAL LOW (ref 0.44–1.00)
GFR, Estimated: >60 mL/min (ref >60)
Glucose, Bld: 112 mg/dL — ABNORMAL HIGH (ref 70–99)
Potassium: 3.6 mmol/L (ref 3.5–5.1)
Sodium: 141 mmol/L (ref 135–145)
Total Bilirubin: 0.3 mg/dL (ref 0.3–1.2)
Total Protein: 4.9 g/dL — ABNORMAL LOW (ref 6.5–8.1)

## 2021-11-09 LAB — MISC LABCORP TEST (SEND OUT)

## 2021-11-09 LAB — BRAIN NATRIURETIC PEPTIDE: B Natriuretic Peptide: 129.9 pg/mL — ABNORMAL HIGH (ref 0.0–100.0)

## 2021-11-09 LAB — MAGNESIUM: Magnesium: 1.9 mg/dL (ref 1.7–2.4)

## 2021-11-09 LAB — VITAMIN B1: Vitamin B1 (Thiamine): 363.1 nmol/L — ABNORMAL HIGH (ref 66.5–200.0)

## 2021-11-09 LAB — C-REACTIVE PROTEIN: CRP: 4.1 mg/dL — ABNORMAL HIGH (ref <1.0)

## 2021-11-09 LAB — PROCALCITONIN: Procalcitonin: <0.1 ng/mL

## 2021-11-09 LAB — LEVETIRACETAM LEVEL: Levetiracetam Lvl: 21.7 ug/mL (ref 10.0–40.0)

## 2021-11-09 LAB — MRSA NEXT GEN BY PCR, NASAL: MRSA by PCR Next Gen: NOT DETECTED

## 2021-11-09 MED ORDER — FUROSEMIDE 10 MG/ML IJ SOLN
40.0000 mg | Freq: Every day | INTRAMUSCULAR | Status: DC
  Administered 2021-11-09 – 2021-11-11 (×3): 40 mg via INTRAVENOUS
  Filled 2021-11-09 (×3): qty 4

## 2021-11-09 NOTE — Evaluation (Signed)
Clinical/Bedside Swallow Evaluation Patient Details  Name: Melissa Case MRN: [id number] Date of Birth: 05-08-1961  Today's Date: 11/09/2021 Time: SLP Start Time (ACUTE ONLY): 6063 SLP Stop Time (ACUTE ONLY): 0856 SLP Time Calculation (min) (ACUTE ONLY): 22 min  Past Medical History:  Past Medical History:  Diagnosis Date   Cancer ([place])    Partoid   Depression    DVT (deep venous thrombosis) (HCC)    GERD (gastroesophageal reflux disease)    History of radiation to head and neck region 09/16/2019   History IMRT (09/16/19 - 10/27/19) for parotid tumor stage pT3, pN2b.   Panic attack    Seizures ([place])    Past Surgical History:  Past Surgical History:  Procedure Laterality Date   ABDOMINAL HYSTERECTOMY     CHOLECYSTECTOMY N/A 11/21/2017   Procedure: LAPAROSCOPIC CHOLECYSTECTOMY WITH INTRAOPERATIVE CHOLANGIOGRAM;  Surgeon: Jovita Kussmaul, MD;  Location: [facility];  Service: General;  Laterality: N/A;   MULTIPLE EXTRACTIONS WITH ALVEOLOPLASTY N/A 08/28/2019   Procedure: Extraction of tooth #'s 3-7, 10-15, and 20-31 with alveoloplatsy, maxillary right buccal exostosis reduction, and bilateral mandibular tori reductions.;  Surgeon: Lenn Cal, DDS;  Location: [facility];  Service: Oral Surgery;  Laterality: N/A;   PAROTIDECTOMY Right 07/22/2019   with biopsy; done at [facility] by Dr. Fredricka Bonine   HPI:  Pt is a 61 year old female admitted to [facility] with change in mental status, nausea vomiting. MRI brain 11/05/21 -"Sequelae of treated lesion in the right temporal lobe with progressive volume loss and T1 shortening. No definite residual tumor remains". Per MD note 6/20 "Concern for subclinical seizures nonepileptic seizures were high". Pt then transferred to [facility] for LTM EEG. Previous BSE (11/18/20) revealed mild oral dysphagia with recommendations for dys 3, thin liquids.  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    Assessment / Plan / Recommendation  Clinical Impression  Pt very lethargic, but with assist from OT for upright positioning, increased lighting, verbal and tactile stimulation cues with wash cloth, pt adequately alert for limited POs. Oral mechanism examination limited due to her fluctuating level of alertness, although she was noted with R facial droop. Lingual symmetry and ROM appeared [facility]. Despite eyes closed for majority of trials, pt demonstrated minimal but active oral acceptance of ice chips, sips of thin liquids by cup and bites of puree. She required hand over hand/full assist for all POs and cueing to increase oral cavity opening for acceptance. She demonstrated prolonged, yet active oral manipulation of ice chips and small bites of puree. All solids were orally cleared. While pt exhibited limited ability to suck via straw, she was responsive to min-mod verbal cues for labial seal around edge of cup for sips of thin liquids, which were significant for x1 mild throat clear vs grunt sound across x5 trials. Some anterior loss present. Recommend continue clear liquid diet with full supervision and assist for all POs, ensuring pt is alert. Suspect any intake will be minimal for now given mentation. As mentation/ level of alertness improves, diet advancement is likely. SLP to f/u.  SLP Visit Diagnosis: Dysphagia, unspecified (R13.10)    Aspiration Risk  Moderate aspiration risk    Diet Recommendation Thin liquid;Other (Comment) (continue clear liquids)   Liquid Administration via: Cup;No straw Medication Administration: Via alternative means (if mroe alert can trial crushed in puree) Supervision:  Full supervision/cueing for compensatory strategies;Staff to assist with self feeding Compensations: Minimize environmental distractions;Slow rate;Small sips/bites Postural Changes: Seated upright at 90 degrees     Other  Recommendations Oral Care Recommendations: Oral care BID    Recommendations for follow up therapy are one component of a multi-disciplinary discharge planning process, led by the attending physician.  Recommendations may be updated based on patient status, additional functional criteria and insurance authorization.  Follow up Recommendations Other (comment) (TBD)      Assistance Recommended at Discharge Other (comment) (TBD)  Functional Status Assessment Patient has had a recent decline in their functional status and demonstrates the ability to make significant improvements in function in a reasonable and predictable amount of time.  Frequency and Duration min 2x/week  2 weeks       Prognosis Prognosis for Safe Diet Advancement: Good Barriers to Reach Goals: Cognitive deficits      Swallow Study   General Date of Onset: 11/06/2021 HPI: Pt is a 61 year old female admitted to [facility] with change in mental status, nausea vomiting. MRI brain 11/05/21 -"Sequelae of treated lesion in the right temporal lobe with progressive volume loss and T1 shortening. No definite residual tumor remains". Per MD note 6/20 "Concern for subclinical seizures nonepileptic seizures were high". Pt then transferred to [facility] for LTM EEG. Previous BSE (11/18/20) revealed mild oral dysphagia with recommendations for dys 3, thin liquids.  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 Type of Study: Bedside Swallow Evaluation Previous Swallow Assessment: see HPI Diet Prior to this Study: Thin liquids;Other (Comment) (clear liquids) Temperature Spikes Noted: No Respiratory Status: Nasal cannula History of Recent Intubation: No Behavior/Cognition: Lethargic/Drowsy;Cooperative;Pleasant mood Oral Cavity Assessment: Dried secretions Oral Care Completed by SLP:  Yes Oral Cavity - Dentition: Edentulous Vision:  (difficult to assess) Self-Feeding Abilities: Total assist Patient Positioning: Upright in bed;Postural control adequate for testing Baseline Vocal Quality: Normal Volitional Cough: Weak Volitional Swallow: Unable to elicit    Oral/Motor/Sensory Function Overall Oral Motor/Sensory Function: Generalized oral weakness Facial ROM: Reduced right Facial Symmetry: Abnormal symmetry right Lingual Symmetry: Within Functional Limits   Ice Chips Ice chips: Impaired Presentation: Spoon Oral Phase Impairments: Impaired mastication Oral Phase Functional Implications: Prolonged oral transit   Thin Liquid Thin Liquid: Impaired Presentation: Cup;Straw Oral Phase Impairments: Reduced labial seal Oral Phase Functional Implications: Prolonged oral transit Pharyngeal  Phase Impairments: Throat Clearing - Immediate;Suspected delayed Swallow    Nectar Thick Nectar Thick Liquid: Not tested   Honey Thick Honey Thick Liquid: Not tested   Puree Puree: Impaired Presentation: Spoon Oral Phase Impairments: Reduced labial seal Oral Phase Functional Implications: Prolonged oral transit   Solid     Solid: Not tested       [place] [address], [place], [place] Office Number: [phone]  Acie Fredrickson 11/09/2021,9:31 AM

## 2021-11-09 NOTE — Progress Notes (Signed)
PROGRESS NOTE    Melissa Case  ZOX:[id number] DOB: 1960/11/18 DOA: 2021-11-12 PCP: Rema Fendt, NP    Brief Narrative:   Melissa Case is a 61 y.o. 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  Recently hospitalized for acute on chronic diastolic CHF with recommendations of discharge to SNF but instead discharged home.  She has remained weak while at home with now nausea/vomiting and poor oral intake.  Following admission, patient mental status continued to deteriorate and patient underwent EEG on 11/02/2021 with findings suggestive of mild diffuse encephalopathy, nonspecific without seizure/epileptiform discharges.  Neurology was consulted on 6/16 with concerns about possible worsening vasogenic edema given tapering of her steroids with also consideration for adrenal insufficiency, thiamine depletion, niacin deficiency versus nonconvulsive seizures.  Patient was started on thiamine/niacin deficiency and started on IV dexamethasone.  Patient was also noted to have a urinary tract infection in which she completed antibiotic course with ciprofloxacin.  MR brain without contrast with sequelae of treated lesion right temporal lobe with progressive volume loss, T1 shortening, no definite residual tumor remains, no evidence for residual or recurrent metastatic disease, progressive generalized volume loss and white matter disease, chronic mastoid effusions.  Patient's Keppra was increased to 750 mg every 12 hours, and remained on valproic acid 500 mg every 8 hours.  Patient was transferred to [facility] on 6/24 LTM EEG.  Assessment & Plan:   Acute metabolic encephalopathy Initially thought to be related to UTI which was treated appropriately  with ciprofloxacin in accordance with culture susceptibilities; but despite treatment no improvement in encephalopathy.  Patient with history of seizure disorder and there is concern for subclinical/nonepileptic seizures.  Also concern for vitamin deficiency with thiamine/niacin given her poor oral intake, also consideration of vasogenic edema given tapering of her steroids outpatient.  Initial EEG with findings suggestive of mild diffuse encephalopathy, nonspecific without seizure/epileptiform discharge.  Ammonia level on admission normal.  Cortisol level within normal limits.  CT head without contrast with changes consistent with postradiation but nothing acute.  MR brain with radiation changes to the right temporal lobe, no other acute findings.  Keppra was increased and was continued on home Depakote. --Neurology following, appreciate assistance --Valproic acid 5 mg IV q8h --Keppra 750 mg IV q12h --Decadron 4 mg IV q8h --LTM EEG pending  Enterococcal UTI Completed course of antibiotics with ciprofloxacin in accordance with urine culture susceptibilities.  History of DVT/PE On Eliquis outpatient. --Continue treatment dose Lovenox until tolerates oral intake  Acute on chronic diastolic congestive heart failure, not POA Etiology likely from volume overload from IV fluid hydration during initial hospitalization.  Chest x-ray 11/08/2021 with pulmonary edema.  TTE 09/2021 with normal LVEF, grade 1 diastolic dysfunction. --Lasix 40 mg IV q24h --Strict I's and O's and daily weights  Dysphagia Likely related to mental status, appears to be improving today.  Seen by speech therapy with recommendations of clear liquid diet, aspiration precautions.  Depression --paroxetine 40 mg p.o. daily.  Chronic venous stasis dermatitis Evaluated by wound care, no evidence of open wounds.  GERD: Protonix 40 mg IV every 12 hours  Weakness/deconditioning/gait disturbance: Evaluated by PT/OT with  recommendations of SNF placement. --TOC for SNF placement --Continue therapy efforts while inpatient    DVT prophylaxis:   Lovenox  Code Status: Full Code Family Communication: No family present at bedside this morning  Disposition Plan:  Level of care: Progressive Status is: Inpatient Remains inpatient appropriate because: Continues with altered mental status, slowly improving.  Remains on long-term monitoring EEG, will need SNF placement    Consultants:  Neurology  Procedures:  EEG LTM EEG  Antimicrobials:  Ciprofloxacin 6/16 - 6/19 Ceftriaxone 6/13 - 6/15   Subjective: Patient seen examined bedside, resting comfortably.  EEG technician and RN present.  Patient remains confused, but responding to commands which is a markable improvement since yesterday.  No family present at bedside.  No specific complaints this morning.  Denies chest pain, no shortness of breath, no abdominal pain.  No acute events overnight per nursing staff.  Objective: Vitals:   11/09/21 0347 11/09/21 0410 11/09/21 0736 11/09/21 1057  BP: 127/72  117/66 128/74  Pulse: (!) 58  (!) 58   Resp: 14  18   Temp: 97.7 F (36.5 C)  98.4 F (36.9 C) 98.6 F (37 C)  TempSrc: Axillary   Oral  SpO2: 97%  98%   Weight:  104.9 kg    Height:        Intake/Output Summary (Last 24 hours) at 11/09/2021 1300 Last data filed at 11/09/2021 0646 Gross per 24 hour  Intake 1872.13 ml  Output 400 ml  Net 1472.13 ml   Filed Weights   11/07/21 0406 11/08/21 0500 11/09/21 0410  Weight: 106.8 kg 110.7 kg 104.9 kg    Examination:  Physical Exam: GEN: NAD, alert, oriented to place [facility]), time (2023), but not person (President: no answer), chronically ill in appearance HEENT: NCAT, PERRL, EOMI, sclera clear, MMM PULM: Breath sounds slight decreased Bilo bases with mild crackles, no wheezing, normal respiratory effort without accessory muscle use, on 6 L nasal cannula with SPO2 97% at rest. CV: RRR w/o  M/G/R GI: abd soft, NTND, NABS, no R/G/M MSK: + , moving all extremities independently NEURO: CN II-XII intact, no focal deficits, sensation to light touch intact PSYCH: normal mood/affect Integumentary: dry/intact, no rashes or wounds    Data Reviewed: I have personally reviewed following labs and imaging studies  CBC: Recent Labs  Lab 11/03/21 0413 11/05/21 0737 11/07/21 0900 11/08/21 1524 11/09/21 0539  WBC 6.3 5.4 7.9 7.6 6.1  NEUTROABS  --   --   --   --  5.0  HGB 10.7* 12.2 12.9 11.3* 11.3*  HCT 35.2* 39.2 40.3 36.9 35.4*  MCV 103.8* 103.4* 98.3 100.5* 98.1  PLT 201 155 124* 109* 122*   Basic Metabolic Panel: Recent Labs  Lab 11/03/21 0413 11/03/21 1024 11/04/21 0935 11/05/21 0737 11/07/21 0926 11/09/21 0539  NA 147*  --  144 143 135 141  K 3.1*  --  3.4* 3.4* 5.0 3.6  CL 108  --  108 107 100 101  CO2 30  --  30 31 26  34*  GLUCOSE 71  --  88 103* 141* 112*  BUN <5*  --  <5* <5* <5* <5*  CREATININE 0.50  --  0.39* 0.41* 0.31* 0.36*  CALCIUM 8.1*  --  8.2* 8.3* 7.7* 8.1*  MG 2.0 2.0  --   --   --  1.9   GFR: Estimated Creatinine Clearance: 88.3 mL/min (A) (by C-G formula based on SCr of 0.36 mg/dL (L)). Liver Function Tests: Recent Labs  Lab 11/03/21 0413 11/05/21 0737 11/09/21 0539  AST 43* 35 33  ALT 25 21 16   ALKPHOS 79 83 82  BILITOT 0.8 0.5 0.3  PROT 4.6* 4.9* 4.9*  ALBUMIN 1.9* 1.9* 2.1*   No results for input(s): "LIPASE", "AMYLASE" in the last 168 hours. No results for input(s): "AMMONIA" in the last 168 hours. Coagulation Profile: No results for input(s): "INR", "PROTIME" in the last 168 hours. Cardiac Enzymes: No results for input(s): "CKTOTAL", "CKMB", "CKMBINDEX", "TROPONINI" in the last 168 hours. BNP (last 3 results) Recent Labs    08/12/21 1009  PROBNP 28.0   HbA1C: No results for input(s): "HGBA1C" in the last 72 hours. CBG: Recent Labs  Lab 11/08/21 1124 11/08/21 1625 11/08/21 2126 11/09/21 0620 11/09/21 1124   GLUCAP 165* 96 145* 114* 113*   Lipid Profile: No results for input(s): "CHOL", "HDL", "LDLCALC", "TRIG", "CHOLHDL", "LDLDIRECT" in the last 72 hours. Thyroid Function Tests: No results for input(s): "TSH", "T4TOTAL", "FREET4", "T3FREE", "THYROIDAB" in the last 72 hours. Anemia Panel: No results for input(s): "VITAMINB12", "FOLATE", "FERRITIN", "TIBC", "IRON", "RETICCTPCT" in the last 72 hours. Sepsis Labs: Recent Labs  Lab 11/09/21 0539  PROCALCITON <0.10    Recent Results (from the past 240 hour(s))  Urine Culture     Status: Abnormal   Collection Time: 11/16/2021  3:23 PM   Specimen: In/Out Cath Urine  Result Value Ref Range Status   Specimen Description   Final    IN/OUT CATH URINE Performed at [facility], [address] [place], [place] [address]    Special Requests   Final    NONE Performed at [facility], [address], [place], [place] [address]    Culture >=100,000 COLONIES/mL ENTEROBACTER AEROGENES (A)  Final   Report Status 11/03/2021 FINAL  Final   Organism ID, Bacteria ENTEROBACTER AEROGENES (A)  Final      Susceptibility   Enterobacter aerogenes - MIC*    CEFAZOLIN >=64 RESISTANT Resistant     CEFEPIME 1 SENSITIVE Sensitive     CEFTRIAXONE >=64 RESISTANT Resistant     CIPROFLOXACIN <=0.25 SENSITIVE Sensitive     GENTAMICIN <=1 SENSITIVE Sensitive     IMIPENEM 1 SENSITIVE Sensitive     NITROFURANTOIN 128 RESISTANT Resistant     TRIMETH/SULFA <=20 SENSITIVE Sensitive     PIP/TAZO >=128 RESISTANT Resistant     * >=100,000 COLONIES/mL ENTEROBACTER AEROGENES  MRSA Next Gen by PCR, Nasal     Status: None   Collection Time: 11/08/21  6:53 PM   Specimen: Nasal Mucosa; Nasal Swab  Result Value Ref Range Status   MRSA by PCR Next Gen NOT DETECTED NOT DETECTED Final    Comment: (NOTE) The GeneXpert MRSA Assay (FDA approved for NASAL specimens only), is one component of a comprehensive MRSA colonization  surveillance program. It is not intended to diagnose MRSA infection nor to guide or monitor treatment for MRSA infections. Test performance is not FDA approved in patients less than 60 years old. Performed at [facility], [address], [place], [place] [address]          Radiology Studies: DG Chest Port 1 View  Result Date: 11/09/2021 CLINICAL DATA:  History of delirium. EXAM: PORTABLE CHEST 1 VIEW COMPARISON:  Yesterday FINDINGS: Low volume chest with indistinct bilateral pulmonary opacity that is unchanged. Edema or infection could give this appearance. Cardiomegaly and vascular pedicle widening. No visible effusion or pneumothorax. IMPRESSION: Unchanged low volume chest with diffuse opacification. Electronically Signed   By: Tiburcio Pea M.D.   On: 11/09/2021 06:29   DG Chest 1 View  Result Date: 11/08/2021  CLINICAL DATA:  Shortness of breath. EXAM: CHEST  1 VIEW COMPARISON:  11/13/2021 FINDINGS: Significant worsening lung aeration with interstitial and airspace process, likely fulminant pulmonary edema. No definite pleural effusions. The cardiac silhouette, mediastinal and hilar contours are stable. IMPRESSION: Significant worsening lung aeration, likely fulminant pulmonary edema. Electronically Signed   By: Rudie Meyer M.D.   On: 11/08/2021 14:09        Scheduled Meds:  (feeding supplement) PROSource Plus  30 mL Oral BID BM   Chlorhexidine Gluconate Cloth  6 each Topical Daily   dexamethasone (DECADRON) injection  4 mg Intravenous Q8H   enoxaparin (LOVENOX) injection  100 mg Subcutaneous BID   insulin aspart  0-9 Units Subcutaneous TID WC   lactose free nutrition  237 mL Oral TID WC   pantoprazole (PROTONIX) IV  40 mg Intravenous Q12H   PARoxetine  40 mg Oral Daily   sodium chloride flush  10-40 mL Intracatheter Q12H   Continuous Infusions:  levETIRAcetam 750 mg (11/09/21 0920)   valproate sodium 500 mg (11/09/21 0624)     LOS: 8 days    Time spent: 52  minutes spent on chart review, discussion with nursing staff, consultants, updating family and interview/physical exam; more than 50% of that time was spent in counseling and/or coordination of care.    Alvira Philips [place], DO [facility] Available via Epic secure chat 7am-7pm After these hours, please refer to coverage provider listed on [email or web address] 11/09/2021, 1:00 PM

## 2021-11-09 NOTE — Evaluation (Signed)
Occupational Therapy Evaluation Patient Details Name: Melissa Case MRN: [id number] DOB: 1961/01/02 Today's Date: 11/09/2021   History of Present Illness 61 year old female admitted to [facility] with change in mental status, nausea vomiting. MRI brain 11/05/21 -"Sequelae of treated lesion in the right temporal lobe with progressive volume loss; Concern for subclinical seizures nonepileptic seizures'. Pt then transferred to [facility] for LTM EEG. PMH: chronic venous stasis; recent admission 10/12/2021 through 10/19/2021 with acute on chronic diastolic CHF; parotid gland malignancy with mets to the brain status postsurgery and radiation, seizure disorder, bilateral lower extremity DVT and PE   Clinical Impression   Spoke to husband over the phone. Prior to recent hospitalization @ 2 weeks ago, pt independent with mobility, was able to fix simple meals and required Min A with LB ADL due to hx of LE edema. Husband works second shift and pt was alone while he worked. Primarily sleeps in her lift chair. Pt was able to follow 1 step commands with delay, answer some questions and interact with therapists, however this was affected by her level of arousal. Able to progress to EOB with Max A +2, then maintain midline postural control with S; and although initiated anterior weight shift, was unable to stand with +2 A. Total A at this time with ADL, including self - feeding. Recommend rehab at SNF. Acute OT to follow.     Recommendations for follow up therapy are one component of a multi-disciplinary discharge planning process, led by the attending physician.  Recommendations may be updated based on patient status, additional functional criteria and insurance authorization.   Follow Up Recommendations  Skilled nursing-short term rehab (<3 hours/day)    Assistance Recommended at Discharge Frequent or constant Supervision/Assistance  Patient can return home with the following Two people to help with walking and/or  transfers;Two people to help with bathing/dressing/bathroom;Assistance with feeding;Direct supervision/assist for medications management;Direct supervision/assist for financial management;Assist for transportation;Help with stairs or ramp for entrance    Functional Status Assessment  Patient has had a recent decline in their functional status and demonstrates the ability to make significant improvements in function in a reasonable and predictable amount of time.  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 Restrictions Weight Bearing Restrictions: No      Mobility Bed Mobility Overal bed mobility: Needs Assistance Bed Mobility: Supine to Sit, Sit to Supine Rolling: +2 for physical assistance, Max assist (pt 0%)   Supine to sit: +2 for physical assistance, Max assist (pt 0%) Sit to supine:  (pt 0%)   General bed mobility comments: Pt initiated moving legs to EOB and helped with transition    Transfers Overall transfer level: Needs assistance (unable to transfer, will require hoyer lift OOB)   Transfers: Sit to/from Stand             General transfer comment: attempts to initiate but unable to complete      Balance Overall balance assessment: Needs assistance Sitting-balance support: Feet supported Sitting balance-Leahy Scale: Fair     Standing balance support:  (unable to stand)                               ADL either performed or assessed with clinical judgement   ADL Overall ADL's :  Needs assistance/impaired                                     Functional mobility during ADLs: Maximal assistance;+2 for physical assistance General ADL Comments: total A at this time with ADL tasks; able to participate with hand over hand; helped to hold cup with  assistance ot maintain grasp when speech assessing swallow     Vision   Additional Comments: eyes closed majority of session     Perception     Praxis      Pertinent Vitals/Pain Pain Assessment Faces Pain Scale: No hurt     Hand Dominance Right   Extremity/Trunk Assessment Upper Extremity Assessment Upper Extremity Assessment: Generalized weakness (difficulty completing hand to mouth with BUE; initiates movement with both UE; appear similar in weakness patterns; AA/PROM WFL; BUE weaping)   Lower Extremity Assessment Lower Extremity Assessment: Defer to PT evaluation   Cervical / Trunk Assessment Cervical / Trunk Assessment: Kyphotic   Communication Communication Communication: Other (comment);HOH (difficult to understand at times)   Cognition Arousal/Alertness: Lethargic Behavior During Therapy: Flat affect Overall Cognitive Status: Impaired/Different from baseline Area of Impairment: Safety/judgement, Following commands, Awareness, Problem solving, Attention, Orientation                 Orientation Level: Disoriented to, Place, Time, Situation ("hospital") Current Attention Level: Sustained   Following Commands: Follows one step commands with increased time Safety/Judgement: Decreased awareness of safety, Decreased awareness of deficits Awareness: Intellectual Problem Solving: Slow processing, Requires verbal cues, Difficulty sequencing       General Comments  VSS on 8L    Exercises     Shoulder Instructions      Home Living Family/patient expects to be discharged to:: Skilled nursing facility Living Arrangements: Spouse/significant other Available Help at Discharge: Family;Available PRN/intermittently Type of Home: House Home Access: Stairs to enter [employer] of Steps: 4 Entrance Stairs-Rails: Left;Right;Can reach both Home Layout: One level     Bathroom Shower/Tub: Occupational psychologist: Handicapped height Bathroom  Accessibility: Yes   Home Equipment: Conservation officer, nature (2 wheels);Transport chair;Shower seat   Additional Comments: pt limited to living room and bathroom      Prior Functioning/Environment Prior Level of Function : Needs assist       Physical Assist : Mobility (physical);ADLs (physical) Mobility (physical): Stairs;Gait ADLs (physical): Bathing;Dressing;IADLs Mobility Comments: prior to hospitalization 2 weeks ago, pt typically Independent in all mobility; husband assisted with bathinga nd LB dressing due to LE edema ADLs Comments: Prior to seizure one year ago, pt was completely Independent with ADls, IADLs and was working full time in a warehouse. After seizure, pt with increased difficulty bathing, dressing and toilet transfers (improved when BSC placed over toilet) and has been sponge bathing rather than showering.        OT Problem List: Decreased strength;Decreased range of motion;Decreased activity tolerance;Impaired balance (sitting and/or standing);Decreased coordination;Decreased cognition;Decreased safety awareness;Decreased knowledge of use of DME or AE;Decreased knowledge of precautions;Obesity;Impaired UE functional use;Increased edema      OT Treatment/Interventions: Self-care/ADL training;Therapeutic exercise;Neuromuscular education;DME and/or AE instruction;Therapeutic activities;Energy conservation;Cognitive remediation/compensation;Visual/perceptual remediation/compensation;Patient/family education;Balance training    OT Goals(Current goals can be found in the care plan section) Acute Rehab OT Goals Patient Stated Goal: per husband for his wife to get some rehab before she comes home OT Goal Formulation: With patient/family Time For Goal Achievement: 11/23/21 Potential to Achieve Goals:  Good  OT Frequency: Min 2X/week    Co-evaluation PT/OT/SLP Co-Evaluation/Treatment: Yes            AM-PAC OT "6 Clicks" Daily Activity     Outcome Measure Help from another  person eating meals?: Total Help from another person taking care of personal grooming?: Total Help from another person toileting, which includes using toliet, bedpan, or urinal?: Total Help from another person bathing (including washing, rinsing, drying)?: Total Help from another person to put on and taking off regular upper body clothing?: Total Help from another person to put on and taking off regular lower body clothing?: Total 6 Click Score: 6   End of Session Equipment Utilized During Treatment: Gait belt Nurse Communication: Mobility status;Other (comment) (increased ability to follow command and communicate)  Activity Tolerance: Patient tolerated treatment well Patient left: in bed;with call bell/phone within reach;with bed alarm set  OT Visit Diagnosis: Unsteadiness on feet (R26.81);Other abnormalities of gait and mobility (R26.89);Muscle weakness (generalized) (M62.81);Other symptoms and signs involving cognitive function;Other symptoms and signs involving the nervous system (R29.898)                Time: 0488-8916 OT Time Calculation (min): 31 min Charges:  OT General Charges $OT Visit: 1 Visit OT Evaluation $OT Eval Moderate Complexity: [place], OT/L   Acute OT Clinical Specialist Acute Rehabilitation Services Pager [phone] Office [phone]   [facility] 11/09/2021, 9:51 AM

## 2021-11-09 NOTE — Progress Notes (Signed)
LTM EEG hooked up and running - no initial skin breakdown - push button tested - neuro notified. [facility] monitoring.  

## 2021-11-09 NOTE — Progress Notes (Signed)
Subjective: Patient arousable to light sternal rub but not oriented. Per chart review she was following commands earlier today but will not do so on my exam.   Exam: Vitals:   11/09/21 1057 11/09/21 1500  BP: 128/74 110/65  Pulse:    Resp:  20  Temp: 98.6 F (37 C) 98 F (36.7 C)  SpO2:     Gen: In bed, NAD Resp: non-labored breathing, no acute distress Abd: soft, nt  Neuro: MS: arousable to mild sternal rub, not oriented, will not follow commands Speech: no intelligible speech Cranial Nerves: II: Visual Fields are difficult to assess due to somnolence. Pupils are equal, round, and reactive to light.   III,IV, VI: Looks in all directions, initially possibly has a left gaze preference but then keeps looking down at her mittens which she is trying to remove V: Facial sensation intact bilaterally to eyelash brush VII: Facial movement is notable for right lower motor neuron facial droop.  VIII: extremely hard of hearing 9/10 difficult to assess given her mental status XII: tongue is midline without atrophy or fasciculations.  Motor: Will withdraw BUE to noxious stimuli and will flicker BLE to noxious stimuli Sensory:Appears equally reactive in all four extremities DTR:2+ bicep and brachioradialis, unable to elicit patellar reflexes  Pertinent Labs: CBC    Component Value Date/Time   WBC 6.1 11/09/2021 0539   RBC 3.61 (L) 11/09/2021 0539   HGB 11.3 (L) 11/09/2021 0539   HGB 10.8 (L) 05/20/2021 1030   HCT 35.4 (L) 11/09/2021 0539   HCT 33.2 (L) 05/20/2021 1030   PLT 122 (L) 11/09/2021 0539   PLT 203 05/20/2021 1030   MCV 98.1 11/09/2021 0539   MCV 99 (H) 05/20/2021 1030   MCH 31.3 11/09/2021 0539   MCHC 31.9 11/09/2021 0539   RDW 16.7 (H) 11/09/2021 0539   RDW 14.6 05/20/2021 1030   LYMPHSABS 0.4 (L) 11/09/2021 0539   LYMPHSABS 2.5 07/23/2017 0820   MONOABS 0.7 11/09/2021 0539   EOSABS 0.0 11/09/2021 0539   EOSABS 0.2 07/23/2017 0820   BASOSABS 0.0 11/09/2021 0539    BASOSABS 0.1 07/23/2017 0820       Latest Ref Rng & Units 11/09/2021    5:39 AM 11/07/2021    9:26 AM 11/05/2021    7:37 AM  BMP  Glucose 70 - 99 mg/dL 112  141  103   BUN 6 - 20 mg/dL <5  <5  <5   Creatinine 0.44 - 1.00 mg/dL 0.36  0.31  0.41   Sodium 135 - 145 mmol/L 141  135  143   Potassium 3.5 - 5.1 mmol/L 3.6  5.0  3.4   Chloride 98 - 111 mmol/L 101  100  107   CO2 22 - 32 mmol/L 34  26  31   Calcium 8.9 - 10.3 mg/dL 8.1  7.7  8.3     Folate 8.9 B12 1,386   Impression: 61 year old patient with decreased responsiveness and encephalopathy. At baseline, she is able to ambulate and care for herself.  Her condition at home appeared to worsen when steroids were discontinued, so will continue decadron.  MRI brain shows sequelae of treated lesion in right temporal lobe with no tumor recurrence and no edema.  Patient does have a history of seizures, and last known seizure was 10/22.  She is on Keppra and Depakote.  Concern for nonconvulsive seizure activity. Patient transferred to [facility] overnight and hooked up to LTM EEG this AM, read pending. She v  likely has a significant component of hypoactive delirium as well  Recommendations: 1)LTM EEG.  2)Continue Keppra and Depakote 3)Continue Decadron 4)Delirium precautions  5)Neurology will follow  Su Monks, MD [facility] [phone]  If 7pm- 7am, please page neurology on call as listed in [place].

## 2021-11-09 NOTE — Progress Notes (Signed)
Physical Therapy Treatment Patient Details Name: Melissa Case MRN: [id number] DOB: April 05, 1961 Today's Date: 11/09/2021   History of Present Illness 61 year old female admitted to [facility] with change in mental status, nausea vomiting. MRI brain 11/05/21 -"Sequelae of treated lesion in the right temporal lobe with progressive volume loss; Concern for subclinical seizures nonepileptic seizures'. Pt then transferred to [facility] for LTM EEG. 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 Continues to be lethargic but able to open eyes to command and answer questions/interact with therapists with increased time and repetition. Requires Max A of 2 for bed mobility but able to sit EOB with close Min guard assist. Attempted to stand from EOB with assist of 2 however unable to perform, initiation noted. Recommend lift for OOB with nursing. Continues to recommend SNF to maximize independence and mobility prior to return home as pt independent with mobility and participating in IADLs PTA. Will follow acutely.   Recommendations for follow up therapy are one component of a multi-disciplinary discharge planning process, led by the attending physician.  Recommendations may be updated based on patient status, additional functional criteria and insurance authorization.  Follow Up Recommendations  Skilled nursing-short term rehab (<3 hours/day) Can patient physically be transported by private vehicle: No   Assistance Recommended at Discharge Frequent or constant Supervision/Assistance  Patient can return home with the following Assist for transportation;Help with stairs or ramp for entrance;Two people to help with bathing/dressing/bathroom;Two people to help  with walking and/or transfers;Assistance with feeding;Assistance with cooking/housework;Other (comment)   Equipment Recommendations  None recommended by PT    Recommendations for Other Services       Precautions / Restrictions Precautions Precautions: Fall Precaution Comments: 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    Ambulation/Gait               General Gait Details: Unable   Stairs             Wheelchair Mobility    Modified Rankin (Stroke Patients Only)       Balance Overall balance assessment: Needs assistance Sitting-balance support: Feet supported, No upper extremity supported Sitting balance-Leahy Scale: Fair Sitting balance - Comments: Close Min guard for sitting balance.       Standing balance comment: Unable to stand                            Cognition Arousal/Alertness: Lethargic Behavior During Therapy: Flat affect Overall Cognitive Status: Impaired/Different from baseline Area of Impairment: Safety/judgement, Following commands, Awareness, Problem solving, Attention, Orientation  Orientation Level: Disoriented to, Place, Time, Situation ("hospital") Current Attention Level: Sustained   Following Commands: Follows one step commands with increased time Safety/Judgement: Decreased awareness of safety, Decreased awareness of  deficits Awareness: Intellectual Problem Solving: Slow processing, Requires verbal cues, Difficulty sequencing General Comments: Pt requires constant stimulus to respond to questions/commands. Eyes remained closed for most of session but able to open eyes with cues but not sustain. Follows simple commands with increased time and repetition.        Exercises      General Comments General comments (skin integrity, edema, etc.): VSS on 8L/min 02 [place]      Pertinent Vitals/Pain Pain Assessment Pain Assessment: Faces Faces Pain Scale: No hurt    Home Living Family/patient expects to be discharged to:: Skilled nursing facility Living Arrangements: Spouse/significant other Available Help at Discharge: Family;Available PRN/intermittently Type of Home: House Home Access: Stairs to enter Entrance Stairs-Rails: Left;Right;Can reach both Entrance Stairs-Number of Steps: 4   Home Layout: One level Home Equipment: Conservation officer, nature (2 wheels);Transport chair;Shower seat Additional Comments: pt limited to living room and bathroom    Prior Function            PT Goals (current goals can now be found in the care plan section) Progress towards PT goals: Progressing toward goals (slowly)    Frequency    Min 2X/week      PT Plan Frequency needs to be updated    Co-evaluation PT/OT/SLP Co-Evaluation/Treatment: Yes Reason for Co-Treatment: Complexity of the patient's impairments (multi-system involvement);Necessary to address cognition/behavior during functional activity;For patient/therapist safety;To address functional/ADL transfers PT goals addressed during session: Mobility/safety with mobility;Balance;Strengthening/ROM        AM-PAC PT "6 Clicks" Mobility   Outcome Measure  Help needed turning from your back to your side while in a flat bed without using bedrails?: Total Help needed moving from lying on your back to sitting on the side of a flat bed without using bedrails?:  Total Help needed moving to and from a bed to a chair (including a wheelchair)?: Total Help needed standing up from a chair using your arms (e.g., wheelchair or bedside chair)?: Total Help needed to walk in hospital room?: Total Help needed climbing 3-5 steps with a railing? : Total 6 Click Score: 6    End of Session Equipment Utilized During Treatment: Oxygen Activity Tolerance: Patient limited by lethargy Patient left: in bed;with call bell/phone within reach;with bed alarm set Nurse Communication: Mobility status;Need for lift equipment PT Visit Diagnosis: Muscle weakness (generalized) (M62.81);Other abnormalities of gait and mobility (R26.89)     Time: 8416-6063 PT Time Calculation (min) (ACUTE ONLY): 21 min  Charges:  $Therapeutic Activity: 8-22 mins                     Marisa Severin, PT, DPT Acute Rehabilitation Services Secure chat preferred Office [phone]      Marguarite Arbour A [place] 11/09/2021, 11:40 AM

## 2021-11-10 DIAGNOSIS — F330 Major depressive disorder, recurrent, mild: Secondary | ICD-10-CM

## 2021-11-10 DIAGNOSIS — I5032 Chronic diastolic (congestive) heart failure: Secondary | ICD-10-CM | POA: Diagnosis not present

## 2021-11-10 DIAGNOSIS — G9341 Metabolic encephalopathy: Secondary | ICD-10-CM | POA: Diagnosis not present

## 2021-11-10 DIAGNOSIS — C07 Malignant neoplasm of parotid gland: Secondary | ICD-10-CM

## 2021-11-10 DIAGNOSIS — R569 Unspecified convulsions: Secondary | ICD-10-CM | POA: Diagnosis not present

## 2021-11-10 DIAGNOSIS — I824Y3 Acute embolism and thrombosis of unspecified deep veins of proximal lower extremity, bilateral: Secondary | ICD-10-CM

## 2021-11-10 DIAGNOSIS — R112 Nausea with vomiting, unspecified: Secondary | ICD-10-CM

## 2021-11-10 DIAGNOSIS — E876 Hypokalemia: Secondary | ICD-10-CM

## 2021-11-10 DIAGNOSIS — N3000 Acute cystitis without hematuria: Secondary | ICD-10-CM | POA: Diagnosis not present

## 2021-11-10 LAB — CBC WITH DIFFERENTIAL/PLATELET
Abs Immature Granulocytes: 0.1 10*3/uL — ABNORMAL HIGH (ref 0.00–0.07)
Basophils Absolute: 0 10*3/uL (ref 0.0–0.1)
Basophils Relative: 0 %
Eosinophils Absolute: 0 10*3/uL (ref 0.0–0.5)
Eosinophils Relative: 0 %
HCT: 36.6 % (ref 36.0–46.0)
Hemoglobin: 11.8 g/dL — ABNORMAL LOW (ref 12.0–15.0)
Lymphocytes Relative: 4 %
Lymphs Abs: 0.2 10*3/uL — ABNORMAL LOW (ref 0.7–4.0)
MCH: 31.7 pg (ref 26.0–34.0)
MCHC: 32.2 g/dL (ref 30.0–36.0)
MCV: 98.4 fL (ref 80.0–100.0)
Metamyelocytes Relative: 1 %
Monocytes Absolute: 0.5 10*3/uL (ref 0.1–1.0)
Monocytes Relative: 10 %
Myelocytes: 1 %
Neutro Abs: 3.9 10*3/uL (ref 1.7–7.7)
Neutrophils Relative %: 84 %
Platelets: 175 10*3/uL (ref 150–400)
RBC: 3.72 MIL/uL — ABNORMAL LOW (ref 3.87–5.11)
RDW: 16.8 % — ABNORMAL HIGH (ref 11.5–15.5)
WBC: 4.6 10*3/uL (ref 4.0–10.5)
nRBC: 0 % (ref 0.0–0.2)
nRBC: 0 /100 WBC

## 2021-11-10 LAB — COMPREHENSIVE METABOLIC PANEL
ALT: 19 U/L (ref 0–44)
AST: 46 U/L — ABNORMAL HIGH (ref 15–41)
Albumin: 2.2 g/dL — ABNORMAL LOW (ref 3.5–5.0)
Alkaline Phosphatase: 85 U/L (ref 38–126)
Anion gap: 9 (ref 5–15)
BUN: <5 mg/dL — ABNORMAL LOW (ref 6–20)
CO2: 32 mmol/L (ref 22–32)
Calcium: 8.1 mg/dL — ABNORMAL LOW (ref 8.9–10.3)
Chloride: 99 mmol/L (ref 98–111)
Creatinine, Ser: 0.36 mg/dL — ABNORMAL LOW (ref 0.44–1.00)
GFR, Estimated: >60 mL/min (ref >60)
Glucose, Bld: 112 mg/dL — ABNORMAL HIGH (ref 70–99)
Potassium: 3.7 mmol/L (ref 3.5–5.1)
Sodium: 140 mmol/L (ref 135–145)
Total Bilirubin: 0.6 mg/dL (ref 0.3–1.2)
Total Protein: 5 g/dL — ABNORMAL LOW (ref 6.5–8.1)

## 2021-11-10 LAB — GLUCOSE, CAPILLARY
Glucose-Capillary: 96 mg/dL (ref 70–99)
Glucose-Capillary: 110 mg/dL — ABNORMAL HIGH (ref 70–99)
Glucose-Capillary: 103 mg/dL — ABNORMAL HIGH (ref 70–99)
Glucose-Capillary: 112 mg/dL — ABNORMAL HIGH (ref 70–99)

## 2021-11-10 LAB — MAGNESIUM: Magnesium: 1.9 mg/dL (ref 1.7–2.4)

## 2021-11-10 LAB — PROCALCITONIN: Procalcitonin: <0.1 ng/mL

## 2021-11-10 LAB — BRAIN NATRIURETIC PEPTIDE: B Natriuretic Peptide: 128.9 pg/mL — ABNORMAL HIGH (ref 0.0–100.0)

## 2021-11-10 LAB — C-REACTIVE PROTEIN: CRP: 2 mg/dL — ABNORMAL HIGH (ref <1.0)

## 2021-11-10 LAB — AMMONIA: Ammonia: 28 umol/L (ref 9–35)

## 2021-11-10 MED ORDER — ORAL CARE MOUTH RINSE: 15.0000 mL | OROMUCOSAL | Status: DC | PRN

## 2021-11-10 MED ORDER — LEVETIRACETAM IN NACL 500 MG/100ML IV SOLN
500.0000 mg | Freq: Two times a day (BID) | INTRAVENOUS | Status: DC
  Administered 2021-11-10 – 2021-11-13 (×7): 500 mg via INTRAVENOUS
  Filled 2021-11-10 (×8): qty 100

## 2021-11-10 MED ORDER — ORAL CARE MOUTH RINSE
15.0000 mL | OROMUCOSAL | Status: DC
  Administered 2021-11-10 – 2021-11-13 (×14): 15 mL via OROMUCOSAL

## 2021-11-10 NOTE — Progress Notes (Addendum)
PROGRESS NOTE    Melissa Case  BMW:[id number] DOB: [birth year] DOA: 11/11/2021 PCP: Rema Fendt, NP    Brief Narrative:   Melissa Case is a 61 y.o. 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  Recently hospitalized for acute on chronic diastolic CHF with recommendations of discharge to SNF but instead discharged home.  She has remained weak while at home with now nausea/vomiting and poor oral intake.  Following admission, patient mental status continued to deteriorate and patient underwent EEG on 11/02/2021 with findings suggestive of mild diffuse encephalopathy, nonspecific without seizure/epileptiform discharges.  Neurology was consulted on 6/16 with concerns about possible worsening vasogenic edema given tapering of her steroids with also consideration for adrenal insufficiency, thiamine depletion, niacin deficiency versus nonconvulsive seizures.  Patient was started on thiamine/niacin deficiency and started on IV dexamethasone.  Patient was also noted to have a urinary tract infection in which she completed antibiotic course with ciprofloxacin.  MR brain without contrast with sequelae of treated lesion right temporal lobe with progressive volume loss, T1 shortening, no definite residual tumor remains, no evidence for residual or recurrent metastatic disease, progressive generalized volume loss and white matter disease, chronic mastoid effusions.  Patient's Keppra was increased to 750 mg every 12 hours, and remained on valproic acid 500 mg every 8 hours.  Patient was transferred to [facility] on 6/24 LTM EEG.  Assessment & Plan:   Acute metabolic encephalopathy Hx seizure disorder Hypoactive delirium Initially thought to be related  to UTI which was treated appropriately with ciprofloxacin in accordance with culture susceptibilities; but despite treatment no improvement in encephalopathy.  Patient with history of seizure disorder and there is concern for subclinical/nonepileptic seizures.  Also concern for vitamin deficiency with thiamine/niacin given her poor oral intake, also consideration of vasogenic edema given tapering of her steroids outpatient.  Initial EEG with findings suggestive of mild diffuse encephalopathy, nonspecific without seizure/epileptiform discharge.  Ammonia level on admission normal.  Cortisol level within normal limits.  CT head without contrast with changes consistent with postradiation but nothing acute.  MR brain with radiation changes to the right temporal lobe, no other acute findings.  Keppra was increased and was continued on home Depakote.  Continuous EEG read this morning with findings suggestive of moderate diffuse encephalopathy, nonspecific with no seizure or epileptiform discharges noted. --Neurology following, appreciate assistance --Valproic acid 5 mg IV q8h --Keppra 750 mg IV q12h --Decadron 4 mg IV q8h --LTM EEG pending  Enterococcal UTI Completed course of antibiotics with ciprofloxacin in accordance with urine culture susceptibilities.  History of DVT/PE On Eliquis outpatient. --Continue treatment dose Lovenox until tolerates oral intake  Acute on chronic diastolic congestive heart failure, not POA Etiology likely from volume overload from IV fluid hydration during initial hospitalization.  Chest x-ray 11/08/2021 with pulmonary edema.  TTE 09/2021 with normal LVEF, grade 1 diastolic dysfunction. --Lasix 40 mg IV q24h --Strict I's and O's and daily weights  Dysphagia Likely related to mental status, appears to be improving today.  Seen by speech therapy with recommendations of clear liquid diet, aspiration precautions.  But continues with poor oral intake, now hospitalized for 9 days.   Discussed with patient's spouse, Gerlene Burdock and agrees with initiating tube feeds until mentation and oral intake improves. --Cortrack ordered --  Dietitian to initiate tube feeds once cortack placed  Depression --paroxetine 40 mg p.o. daily.  Chronic venous stasis dermatitis Evaluated by wound care, no evidence of open wounds.  GERD: Protonix 40 mg IV q12h  Weakness/deconditioning/gait disturbance: Evaluated by PT/OT with recommendations of SNF placement. --TOC for SNF placement --Continue therapy efforts while inpatient    DVT prophylaxis:   Lovenox    Code Status: Full Code Family Communication: No family present at bedside this morning; husband updated via telephone this afternoon  Disposition Plan:  Level of care: Progressive Status is: Inpatient Remains inpatient appropriate because: Continues with altered mental status, slowly improving.  Remains on long-term monitoring EEG, will need SNF placement    Consultants:  Neurology  Procedures:  EEG LTM EEG  Antimicrobials:  Ciprofloxacin 6/16 - 6/19 Ceftriaxone 6/13 - 6/15   Subjective: Patient seen examined bedside, resting comfortably.  No family present this morning.  Continues with confusion but appears to be slowly improving.  EEG read this morning shows no seizure or epileptiform discharges.  No family present at bedside.  No specific complaints this morning.  Knows that she is in the hospital in [place], it is 2023, no answer to the President of the [place] but does state her husband's name is Richard.  Denies chest pain, no shortness of breath, no abdominal pain.  No acute events overnight per nursing staff.  Objective: Vitals:   11/10/21 0300 11/10/21 0500 11/10/21 0807 11/10/21 1125  BP: (!) 121/57  126/71 139/87  Pulse: (!) 51  63 85  Resp: 17  15 18   Temp: 97.7 F (36.5 C)  97.8 F (36.6 C) 98 F (36.7 C)  TempSrc: Axillary  Axillary Axillary  SpO2: 97%  97% 95%  Weight:  104.8 kg    Height:         Intake/Output Summary (Last 24 hours) at 11/10/2021 1300 Last data filed at 11/10/2021 1126 Gross per 24 hour  Intake 368 ml  Output 3650 ml  Net -3282 ml   Filed Weights   11/08/21 0500 11/09/21 0410 11/10/21 0500  Weight: 110.7 kg 104.9 kg 104.8 kg    Examination:  Physical Exam: GEN: NAD, alert, oriented to place [facility]), time (2023), but not person (President: no answer; but states correctly her husband's name is Richard), chronically ill in appearance HEENT: NCAT, PERRL, EOMI, sclera clear, MMM PULM: Breath sounds slight decreased bilateral bases, no wheezes/crackles, normal respiratory effort without accessory muscle use, on room air with SPO2 97% at rest CV: RRR w/o M/G/R GI: abd soft, NTND, NABS, no R/G/M MSK: + Peripheral edema with Unna boots in place, moves bilateral upper extremities to command, less so lower extremities NEURO: PERRL, right facial droop noted, facial sensation intact, PSYCH: normal mood/affect Integumentary: dry/intact, no rashes or wounds    Data Reviewed: I have personally reviewed following labs and imaging studies  CBC: Recent Labs  Lab 11/05/21 0737 11/07/21 0900 11/08/21 1524 11/09/21 0539 11/10/21 0250  WBC 5.4 7.9 7.6 6.1 4.6  NEUTROABS  --   --   --  5.0 3.9  HGB 12.2 12.9 11.3* 11.3* 11.8*  HCT 39.2 40.3 36.9 35.4* 36.6  MCV 103.4* 98.3 100.5* 98.1 98.4  PLT 155 124* 109* 122* 175   Basic Metabolic Panel: Recent Labs  Lab 11/04/21 0935 11/05/21 0737 11/07/21 0926 11/09/21 0539 11/10/21 0250  NA 144 143 135 141 140  K 3.4* 3.4* 5.0 3.6 3.7  CL 108 107 100 101 99  CO2 30 31  26 34* 32  GLUCOSE 88 103* 141* 112* 112*  BUN <5* <5* <5* <5* <5*  CREATININE 0.39* 0.41* 0.31* 0.36* 0.36*  CALCIUM 8.2* 8.3* 7.7* 8.1* 8.1*  MG  --   --   --  1.9 1.9   GFR: Estimated Creatinine Clearance: 88.2 mL/min (A) (by C-G formula based on SCr of 0.36 mg/dL (L)). Liver Function Tests: Recent Labs  Lab 11/05/21 0737  11/09/21 0539 11/10/21 0250  AST 35 33 46*  ALT 21 16 19   ALKPHOS 83 82 85  BILITOT 0.5 0.3 0.6  PROT 4.9* 4.9* 5.0*  ALBUMIN 1.9* 2.1* 2.2*   No results for input(s): "LIPASE", "AMYLASE" in the last 168 hours. Recent Labs  Lab 11/10/21 0228  AMMONIA 28   Coagulation Profile: No results for input(s): "INR", "PROTIME" in the last 168 hours. Cardiac Enzymes: No results for input(s): "CKTOTAL", "CKMB", "CKMBINDEX", "TROPONINI" in the last 168 hours. BNP (last 3 results) Recent Labs    08/12/21 1009  PROBNP 28.0   HbA1C: No results for input(s): "HGBA1C" in the last 72 hours. CBG: Recent Labs  Lab 11/09/21 1124 11/09/21 1637 11/09/21 2109 11/10/21 0615 11/10/21 1131  GLUCAP 113* 110* 101* 96 110*   Lipid Profile: No results for input(s): "CHOL", "HDL", "LDLCALC", "TRIG", "CHOLHDL", "LDLDIRECT" in the last 72 hours. Thyroid Function Tests: No results for input(s): "TSH", "T4TOTAL", "FREET4", "T3FREE", "THYROIDAB" in the last 72 hours. Anemia Panel: No results for input(s): "VITAMINB12", "FOLATE", "FERRITIN", "TIBC", "IRON", "RETICCTPCT" in the last 72 hours. Sepsis Labs: Recent Labs  Lab 11/09/21 0539 11/10/21 0250  PROCALCITON <0.10 <0.10    Recent Results (from the past 240 hour(s))  Urine Culture     Status: Abnormal   Collection Time: 26-Nov-2021  3:23 PM   Specimen: In/Out Cath Urine  Result Value Ref Range Status   Specimen Description   Final    IN/OUT CATH URINE Performed at [facility], [address] [place], [place] [address]    Special Requests   Final    NONE Performed at [facility] [address], [place], [place] [address]    Culture >=100,000 COLONIES/mL ENTEROBACTER AEROGENES (A)  Final   Report Status 11/03/2021 FINAL  Final   Organism ID, Bacteria ENTEROBACTER AEROGENES (A)  Final      Susceptibility   Enterobacter aerogenes - MIC*    CEFAZOLIN >=64 RESISTANT Resistant     CEFEPIME 1 SENSITIVE  Sensitive     CEFTRIAXONE >=64 RESISTANT Resistant     CIPROFLOXACIN <=0.25 SENSITIVE Sensitive     GENTAMICIN <=1 SENSITIVE Sensitive     IMIPENEM 1 SENSITIVE Sensitive     NITROFURANTOIN 128 RESISTANT Resistant     TRIMETH/SULFA <=20 SENSITIVE Sensitive     PIP/TAZO >=128 RESISTANT Resistant     * >=100,000 COLONIES/mL ENTEROBACTER AEROGENES  MRSA Next Gen by PCR, Nasal     Status: None   Collection Time: 11/08/21  6:53 PM   Specimen: Nasal Mucosa; Nasal Swab  Result Value Ref Range Status   MRSA by PCR Next Gen NOT DETECTED NOT DETECTED Final    Comment: (NOTE) The GeneXpert MRSA Assay (FDA approved for NASAL specimens only), is one component of a comprehensive MRSA colonization surveillance program. It is not intended to diagnose MRSA infection nor to guide or monitor treatment for MRSA infections. Test performance is not FDA approved in patients less than 21 years old. Performed at [facility], [address], [place], [place]  [address]          Radiology Studies: Overnight EEG with video  Result Date: 11/10/2021 Charlsie Quest, MD     11/10/2021 10:10 AM Patient Name: Tameron Urtado MRN: [id number] Epilepsy Attending: Charlsie Quest Referring Physician/Provider: Jefferson Fuel, MD Duration: 11/09/2021 1028 to 11/10/2021 1010  Patient history: 61yo F with h/o seizure now with ams. EEG to evaluate for seizure  Level of alertness: Awake, asleep  AEDs during EEG study: LEV, VPA  Technical aspects: This EEG study was done with scalp electrodes positioned according to the 10-20 International system of electrode placement. Electrical activity was acquired at a sampling rate of 500Hz  and reviewed with a high frequency filter of 70Hz  and a low frequency filter of 1Hz . EEG data were recorded continuously and digitally stored.  Description: During awake state, no clear posterior dominant rhythm was seen.  Sleep was characterized by vertex's, sleep spindles (12 to 14 Hz), maximal  frontocentral region.  EEG showed continuous generalized 3 to 6 Hz theta-delta slowing. Hyperventilation and photic stimulation were not performed.    ABNORMALITY -Continuous slow, generalized  IMPRESSION: This study is suggestive of moderate diffuse encephalopathy, nonspecific etiology. No seizures or epileptiform discharges were seen throughout the recording.  Charlsie Quest   DG Chest Port 1 View  Result Date: 11/09/2021 CLINICAL DATA:  History of delirium. EXAM: PORTABLE CHEST 1 VIEW COMPARISON:  Yesterday FINDINGS: Low volume chest with indistinct bilateral pulmonary opacity that is unchanged. Edema or infection could give this appearance. Cardiomegaly and vascular pedicle widening. No visible effusion or pneumothorax. IMPRESSION: Unchanged low volume chest with diffuse opacification. Electronically Signed   By: Tiburcio Pea M.D.   On: 11/09/2021 06:29   DG Chest 1 View  Result Date: 11/08/2021 CLINICAL DATA:  Shortness of breath. EXAM: CHEST  1 VIEW COMPARISON:  07-Nov-2021 FINDINGS: Significant worsening lung aeration with interstitial and airspace process, likely fulminant pulmonary edema. No definite pleural effusions. The cardiac silhouette, mediastinal and hilar contours are stable. IMPRESSION: Significant worsening lung aeration, likely fulminant pulmonary edema. Electronically Signed   By: Rudie Meyer M.D.   On: 11/08/2021 14:09        Scheduled Meds:  (feeding supplement) PROSource Plus  30 mL Oral BID BM   Chlorhexidine Gluconate Cloth  6 each Topical Daily   dexamethasone (DECADRON) injection  4 mg Intravenous Q8H   enoxaparin (LOVENOX) injection  100 mg Subcutaneous BID   furosemide  40 mg Intravenous Daily   insulin aspart  0-9 Units Subcutaneous TID WC   lactose free nutrition  237 mL Oral TID WC   mouth rinse  15 mL Mouth Rinse 4 times per day   pantoprazole (PROTONIX) IV  40 mg Intravenous Q12H   PARoxetine  40 mg Oral Daily   sodium chloride flush  10-40 mL  Intracatheter Q12H   Continuous Infusions:  levETIRAcetam 750 mg (11/10/21 0934)   valproate sodium Stopped (11/10/21 0620)     LOS: 9 days    Time spent: 52 minutes spent on chart review, discussion with nursing staff, consultants, updating family and interview/physical exam; more than 50% of that time was spent in counseling and/or coordination of care.    Alvira Philips [place], DO [facility] Available via Epic secure chat 7am-7pm After these hours, please refer to coverage provider listed on [email or web address] 11/10/2021, 1:00 PM

## 2021-11-10 NOTE — Procedures (Signed)
Patient Name: [name]  MRN: [id number]  Epilepsy Attending: Lora Havens  Referring Physician/Provider: Derek Jack, MD  Duration: 11/09/2021 1028 to 11/10/2021 1010   Patient history: 61yo F with h/o seizure now with ams. EEG to evaluate for seizure   Level of alertness: Awake, asleep   AEDs during EEG study: LEV, VPA   Technical aspects: This EEG study was done with scalp electrodes positioned according to the 10-20 International system of electrode placement. Electrical activity was acquired at a sampling rate of '500Hz'$  and reviewed with a high frequency filter of '70Hz'$  and a low frequency filter of '1Hz'$ . EEG data were recorded continuously and digitally stored.    Description: During awake state, no clear posterior dominant rhythm was seen.  Sleep was characterized by vertex's, sleep spindles (12 to 14 Hz), maximal frontocentral region.  EEG showed continuous generalized 3 to 6 Hz theta-delta slowing. Hyperventilation and photic stimulation were not performed.      ABNORMALITY -Continuous slow, generalized   IMPRESSION: This study is suggestive of moderate diffuse encephalopathy, nonspecific etiology. No seizures or epileptiform discharges were seen throughout the recording.   Melissa Case

## 2021-11-10 NOTE — Progress Notes (Signed)
Orthopedic Tech Progress Note Patient Details:  Melissa Case August 12, 1960 [id number]  Ortho Devices Type of Ortho Device: Haematologist Ortho Device/Splint Location: BLE Ortho Device/Splint Interventions: Ordered, Application, Adjustment   Post Interventions Patient Tolerated: Well Instructions Provided: Care of device  Melissa Case 11/10/2021, 11:19 AM

## 2021-11-10 NOTE — Plan of Care (Signed)
  Problem: Clinical Measurements: Goal: Ability to maintain clinical measurements within normal limits will improve Outcome: Progressing Goal: Will remain free from infection Outcome: Progressing   Problem: Skin Integrity: Goal: Risk for impaired skin integrity will decrease Outcome: Progressing   Problem: Fluid Volume: Goal: Ability to maintain a balanced intake and output will improve Outcome: Progressing   Problem: Nutritional: Goal: Maintenance of adequate nutrition will improve Outcome: Progressing   Problem: Skin Integrity: Goal: Risk for impaired skin integrity will decrease Outcome: Progressing   Problem: Tissue Perfusion: Goal: Adequacy of tissue perfusion will improve Outcome: Progressing

## 2021-11-10 NOTE — Progress Notes (Addendum)
Speech Language Pathology Treatment: Dysphagia  Patient Details Name: Melissa Case MRN: [id number] DOB: 01-12-1961 Today's Date: 11/10/2021 Time: 9379-0240 SLP Time Calculation (min) (ACUTE ONLY): 14 min  Assessment / Plan / Recommendation Clinical Impression  Pt with minimal improvement in level of alertness this am. She followed simple commands for clinician-led oral care and trials of ice chips, thin liquids and bites of puree. Oral manipulation of POs continues to be active and swallow initiation timely, if not mildly delayed at times with liquids. Given mod verbal/tactile/visual cues, pt demonstrated improved oral acceptance of POs and was able to suck liquids by straw. Small bites of puree were completely cleared from the oral cavity consistently. No overt s/sx of aspiration observed with any consistency. Given persisting lethargy/poor level of alertness, an alternative means of nutrition may need to be considered despite likely functional swallow at bedside. Discussed with RN, who reported probable placement of cortrak next date. Pt may continue to have small amounts of clear liquids after oral care, in the upright position, when as alert as possible and following simple commands. SLP to continue f/u.    HPI HPI: Pt is a 61 year old female admitted to [facility] with change in mental status, nausea vomiting. MRI brain 11/05/21 -"Sequelae of treated lesion in the right temporal lobe with progressive volume loss and T1 shortening. No definite residual tumor remains". Per MD note 6/20 "Concern for subclinical seizures nonepileptic seizures were high". Pt then transferred to [facility] for LTM EEG. Previous BSE (11/18/20) revealed mild oral dysphagia with recommendations for dys 3, thin liquids.  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      SLP  Plan  Continue with current plan of care      Recommendations for follow up therapy are one component of a multi-disciplinary discharge planning process, led by the attending physician.  Recommendations may be updated based on patient status, additional functional criteria and insurance authorization.    Recommendations  Diet recommendations: Thin liquid;Other(comment) (continue clear liquid diet) Liquids provided via: Straw;Cup Medication Administration: Via alternative means (if more alert can trial crushed in puree) Supervision: Full supervision/cueing for compensatory strategies;Staff to assist with self feeding Compensations: Minimize environmental distractions;Slow rate;Small sips/bites Postural Changes and/or Swallow Maneuvers: Seated upright 90 degrees                Oral Care Recommendations: Staff/trained caregiver to provide oral care;Oral care before and after PO Follow Up Recommendations: Other (comment) (TBD) Assistance recommended at discharge: Other (comment) (TBD) SLP Visit Diagnosis: Dysphagia, unspecified (R13.10) Plan: Continue with current plan of care          Ellwood Dense, [place], [place] Office Number: [phone]  Acie Fredrickson  11/10/2021, 9:14 AM

## 2021-11-11 ENCOUNTER — Inpatient Hospital Stay (HOSPITAL_COMMUNITY): Payer: BC Managed Care – PPO

## 2021-11-11 ENCOUNTER — Other Ambulatory Visit (HOSPITAL_COMMUNITY): Payer: Self-pay

## 2021-11-11 DIAGNOSIS — I5032 Chronic diastolic (congestive) heart failure: Secondary | ICD-10-CM | POA: Diagnosis not present

## 2021-11-11 DIAGNOSIS — F330 Major depressive disorder, recurrent, mild: Secondary | ICD-10-CM | POA: Diagnosis not present

## 2021-11-11 DIAGNOSIS — G9341 Metabolic encephalopathy: Secondary | ICD-10-CM | POA: Diagnosis not present

## 2021-11-11 DIAGNOSIS — N3000 Acute cystitis without hematuria: Secondary | ICD-10-CM | POA: Diagnosis not present

## 2021-11-11 DIAGNOSIS — F05 Delirium due to known physiological condition: Secondary | ICD-10-CM

## 2021-11-11 DIAGNOSIS — C07 Malignant neoplasm of parotid gland: Secondary | ICD-10-CM | POA: Diagnosis not present

## 2021-11-11 DIAGNOSIS — I6789 Other cerebrovascular disease: Secondary | ICD-10-CM | POA: Diagnosis not present

## 2021-11-11 DIAGNOSIS — R569 Unspecified convulsions: Secondary | ICD-10-CM | POA: Diagnosis not present

## 2021-11-11 LAB — GLUCOSE, CAPILLARY
Glucose-Capillary: 97 mg/dL (ref 70–99)
Glucose-Capillary: 125 mg/dL — ABNORMAL HIGH (ref 70–99)
Glucose-Capillary: 191 mg/dL — ABNORMAL HIGH (ref 70–99)
Glucose-Capillary: 179 mg/dL — ABNORMAL HIGH (ref 70–99)

## 2021-11-11 LAB — BASIC METABOLIC PANEL
Anion gap: 12 (ref 5–15)
BUN: 8 mg/dL (ref 6–20)
CO2: 32 mmol/L (ref 22–32)
Calcium: 8.3 mg/dL — ABNORMAL LOW (ref 8.9–10.3)
Chloride: 98 mmol/L (ref 98–111)
Creatinine, Ser: 0.39 mg/dL — ABNORMAL LOW (ref 0.44–1.00)
GFR, Estimated: >60 mL/min (ref >60)
Glucose, Bld: 118 mg/dL — ABNORMAL HIGH (ref 70–99)
Potassium: 3.8 mmol/L (ref 3.5–5.1)
Sodium: 142 mmol/L (ref 135–145)

## 2021-11-11 LAB — CBC
HCT: 34.7 % — ABNORMAL LOW (ref 36.0–46.0)
Hemoglobin: 11 g/dL — ABNORMAL LOW (ref 12.0–15.0)
MCH: 31.7 pg (ref 26.0–34.0)
MCHC: 31.7 g/dL (ref 30.0–36.0)
MCV: 100 fL (ref 80.0–100.0)
Platelets: 181 10*3/uL (ref 150–400)
RBC: 3.47 MIL/uL — ABNORMAL LOW (ref 3.87–5.11)
RDW: 17.1 % — ABNORMAL HIGH (ref 11.5–15.5)
WBC: 4.7 10*3/uL (ref 4.0–10.5)
nRBC: 0 % (ref 0.0–0.2)

## 2021-11-11 MED ORDER — THIAMINE HCL 100 MG PO TABS
100.0000 mg | ORAL_TABLET | Freq: Every day | ORAL | Status: DC
  Administered 2021-11-11 – 2021-11-13 (×3): 100 mg
  Filled 2021-11-11 (×3): qty 1

## 2021-11-11 MED ORDER — OSMOLITE 1.5 CAL PO LIQD
1000.0000 mL | ORAL | Status: DC
  Administered 2021-11-11 – 2021-11-13 (×2): 1000 mL
  Filled 2021-11-11: qty 1000

## 2021-11-11 MED ORDER — FREE WATER
100.0000 mL | Status: DC
  Administered 2021-11-11 – 2021-11-14 (×14): 100 mL

## 2021-11-11 MED ORDER — ADULT MULTIVITAMIN W/MINERALS CH
1.0000 | ORAL_TABLET | Freq: Every day | ORAL | Status: DC
  Administered 2021-11-11 – 2021-11-13 (×3): 1
  Filled 2021-11-11 (×3): qty 1

## 2021-11-11 MED ORDER — PROSOURCE TF PO LIQD
45.0000 mL | Freq: Every day | ORAL | Status: DC
Start: 2021-11-11 — End: 2021-11-14
  Administered 2021-11-11 – 2021-11-13 (×3): 45 mL
  Filled 2021-11-11 (×3): qty 45

## 2021-11-11 NOTE — Progress Notes (Signed)
LTM D/C no skin breakdown

## 2021-11-11 NOTE — Procedures (Signed)
Cortrak  Person Inserting Tube:  Osa Craver, RD Tube Type:  Cortrak - 43 inches Tube Size:  10 Tube Location:  Left nare Secured by: Bridle Technique Used to Measure Tube Placement:  Marking at nare/corner of mouth Cortrak Secured At:  62 cm Procedure Comments:  Cortrak Tube Team Note:  Consult received to place a Cortrak feeding tube.   X-ray is required, abdominal x-ray has been ordered by the Cortrak team. Please confirm tube placement before using the Cortrak tube.   If the tube becomes dislodged please keep the tube and contact the Cortrak team at [email or web address] ([email or web address]) for replacement.  If after hours and replacement cannot be delayed, place a NG tube and confirm placement with an abdominal x-ray.    Romelle Starcher MS, RDN, LDN, CNSC Registered Dietitian III Clinical Nutrition RD Pager and On-Call Pager Number Located in [place]

## 2021-11-12 ENCOUNTER — Encounter (HOSPITAL_COMMUNITY): Payer: Self-pay | Admitting: Internal Medicine

## 2021-11-12 ENCOUNTER — Inpatient Hospital Stay (HOSPITAL_COMMUNITY): Payer: BC Managed Care – PPO

## 2021-11-12 DIAGNOSIS — I5032 Chronic diastolic (congestive) heart failure: Secondary | ICD-10-CM | POA: Diagnosis not present

## 2021-11-12 DIAGNOSIS — I471 Supraventricular tachycardia: Secondary | ICD-10-CM

## 2021-11-12 DIAGNOSIS — C07 Malignant neoplasm of parotid gland: Secondary | ICD-10-CM | POA: Diagnosis not present

## 2021-11-12 DIAGNOSIS — N3000 Acute cystitis without hematuria: Secondary | ICD-10-CM | POA: Diagnosis not present

## 2021-11-12 DIAGNOSIS — G9341 Metabolic encephalopathy: Secondary | ICD-10-CM | POA: Diagnosis not present

## 2021-11-12 LAB — GLUCOSE, CAPILLARY
Glucose-Capillary: 158 mg/dL — ABNORMAL HIGH (ref 70–99)
Glucose-Capillary: 167 mg/dL — ABNORMAL HIGH (ref 70–99)
Glucose-Capillary: 204 mg/dL — ABNORMAL HIGH (ref 70–99)
Glucose-Capillary: 207 mg/dL — ABNORMAL HIGH (ref 70–99)
Glucose-Capillary: 218 mg/dL — ABNORMAL HIGH (ref 70–99)
Glucose-Capillary: 227 mg/dL — ABNORMAL HIGH (ref 70–99)
Glucose-Capillary: 252 mg/dL — ABNORMAL HIGH (ref 70–99)

## 2021-11-12 LAB — CBC
HCT: 28.6 % — ABNORMAL LOW (ref 36.0–46.0)
Hemoglobin: 9 g/dL — ABNORMAL LOW (ref 12.0–15.0)
MCH: 31.5 pg (ref 26.0–34.0)
MCHC: 31.5 g/dL (ref 30.0–36.0)
MCV: 100 fL (ref 80.0–100.0)
Platelets: 287 10*3/uL (ref 150–400)
RBC: 2.86 MIL/uL — ABNORMAL LOW (ref 3.87–5.11)
RDW: 17.2 % — ABNORMAL HIGH (ref 11.5–15.5)
WBC: 11.5 10*3/uL — ABNORMAL HIGH (ref 4.0–10.5)
nRBC: 0.4 % — ABNORMAL HIGH (ref 0.0–0.2)

## 2021-11-12 LAB — BASIC METABOLIC PANEL
Anion gap: 12 (ref 5–15)
BUN: 18 mg/dL (ref 6–20)
CO2: 27 mmol/L (ref 22–32)
Calcium: 8 mg/dL — ABNORMAL LOW (ref 8.9–10.3)
Chloride: 101 mmol/L (ref 98–111)
Creatinine, Ser: 0.52 mg/dL (ref 0.44–1.00)
GFR, Estimated: >60 mL/min (ref >60)
Glucose, Bld: 231 mg/dL — ABNORMAL HIGH (ref 70–99)
Potassium: 3.6 mmol/L (ref 3.5–5.1)
Sodium: 140 mmol/L (ref 135–145)

## 2021-11-12 LAB — PHOSPHORUS
Phosphorus: 3.3 mg/dL (ref 2.5–4.6)
Phosphorus: 3.1 mg/dL (ref 2.5–4.6)

## 2021-11-12 LAB — MAGNESIUM
Magnesium: 2.2 mg/dL (ref 1.7–2.4)
Magnesium: 2.1 mg/dL (ref 1.7–2.4)

## 2021-11-12 LAB — PROCALCITONIN: Procalcitonin: <0.1 ng/mL

## 2021-11-12 LAB — TROPONIN I (HIGH SENSITIVITY): Troponin I (High Sensitivity): 65 ng/L — ABNORMAL HIGH (ref <18)

## 2021-11-12 MED ORDER — LACTATED RINGERS IV BOLUS
500.0000 mL | Freq: Once | INTRAVENOUS | Status: DC
Start: 2021-11-12 — End: 2021-11-13

## 2021-11-12 MED ORDER — AMIODARONE LOAD VIA INFUSION
150.0000 mg | Freq: Once | INTRAVENOUS | Status: AC
Start: 2021-11-12 — End: 2021-11-12
  Administered 2021-11-12: 150 mg via INTRAVENOUS
  Filled 2021-11-12: qty 83.34

## 2021-11-12 MED ORDER — AMIODARONE HCL IN DEXTROSE 360-4.14 MG/200ML-% IV SOLN
60.0000 mg/h | INTRAVENOUS | Status: AC
  Administered 2021-11-12 (×2): 60 mg/h via INTRAVENOUS
  Filled 2021-11-12 (×2): qty 200

## 2021-11-12 MED ORDER — AMIODARONE HCL IN DEXTROSE 360-4.14 MG/200ML-% IV SOLN
30.0000 mg/h | INTRAVENOUS | Status: DC
Start: 2021-11-12 — End: 2021-11-14
  Administered 2021-11-12 – 2021-11-13 (×4): 30 mg/h via INTRAVENOUS
  Filled 2021-11-12 (×3): qty 200

## 2021-11-12 MED ORDER — POTASSIUM CHLORIDE 10 MEQ/100ML IV SOLN
10.0000 meq | INTRAVENOUS | Status: AC
  Administered 2021-11-12 (×4): 10 meq via INTRAVENOUS
  Filled 2021-11-12: qty 100

## 2021-11-12 MED ORDER — PHENYLEPHRINE HCL-NACL 20-0.9 MG/250ML-% IV SOLN
25.0000 ug/min | INTRAVENOUS | Status: DC
  Administered 2021-11-12: 25 ug/min via INTRAVENOUS
  Filled 2021-11-12: qty 250

## 2021-11-12 MED ORDER — APIXABAN 5 MG PO TABS
5.0000 mg | ORAL_TABLET | Freq: Two times a day (BID) | ORAL | Status: DC
Start: 2021-11-12 — End: 2021-11-13
  Administered 2021-11-12 (×2): 5 mg
  Filled 2021-11-12: qty 1

## 2021-11-12 MED ORDER — INSULIN ASPART 100 UNIT/ML IJ SOLN
0.0000 [IU] | INTRAMUSCULAR | Status: DC
  Administered 2021-11-12: 4 [IU] via SUBCUTANEOUS
  Administered 2021-11-12: 7 [IU] via SUBCUTANEOUS
  Administered 2021-11-12: 4 [IU] via SUBCUTANEOUS
  Administered 2021-11-12: 7 [IU] via SUBCUTANEOUS
  Administered 2021-11-13: 4 [IU] via SUBCUTANEOUS
  Administered 2021-11-13: 11 [IU] via SUBCUTANEOUS
  Administered 2021-11-13: 7 [IU] via SUBCUTANEOUS
  Administered 2021-11-13: 11 [IU] via SUBCUTANEOUS
  Administered 2021-11-13 (×2): 7 [IU] via SUBCUTANEOUS
  Administered 2021-11-14: 4 [IU] via SUBCUTANEOUS

## 2021-11-12 MED ORDER — PAROXETINE HCL 20 MG PO TABS
40.0000 mg | ORAL_TABLET | Freq: Every day | ORAL | Status: DC
Start: 2021-11-13 — End: 2021-11-14
  Administered 2021-11-13: 40 mg
  Filled 2021-11-12 (×2): qty 2

## 2021-11-12 MED ORDER — SODIUM CHLORIDE 0.9 % IV SOLN
250.0000 mL | INTRAVENOUS | Status: DC
  Administered 2021-11-13: 250 mL via INTRAVENOUS

## 2021-11-12 MED ORDER — SODIUM CHLORIDE 0.9 % IV BOLUS
1000.0000 mL | Freq: Once | INTRAVENOUS | Status: AC
Start: 2021-11-12 — End: 2021-11-12
  Administered 2021-11-12: 1000 mL via INTRAVENOUS

## 2021-11-12 MED ORDER — METOPROLOL TARTRATE 5 MG/5ML IV SOLN
INTRAVENOUS | Status: AC
  Administered 2021-11-12: 5 mg
  Filled 2021-11-12: qty 5

## 2021-11-12 MED ORDER — SODIUM CHLORIDE 0.9 % IV SOLN
3.0000 g | Freq: Four times a day (QID) | INTRAVENOUS | Status: DC
  Administered 2021-11-12 – 2021-11-14 (×7): 3 g via INTRAVENOUS
  Filled 2021-11-12 (×7): qty 8

## 2021-11-12 NOTE — Progress Notes (Signed)
PROGRESS NOTE    Melissa Case  MVH:[id number] DOB: Mar 20, 1961 DOA: 11/01/2021 PCP: Rema Fendt, NP    Brief Narrative:   Melissa Case is a 61 y.o. 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  Recently hospitalized for acute on chronic diastolic CHF with recommendations of discharge to SNF but instead discharged home.  She has remained weak while at home with now nausea/vomiting and poor oral intake.  Following admission, patient mental status continued to deteriorate and patient underwent EEG on 11/02/2021 with findings suggestive of mild diffuse encephalopathy, nonspecific without seizure/epileptiform discharges.  Neurology was consulted on 6/16 with concerns about possible worsening vasogenic edema given tapering of her steroids with also consideration for adrenal insufficiency, thiamine depletion, niacin deficiency versus nonconvulsive seizures.  Patient was started on thiamine/niacin deficiency and started on IV dexamethasone.  Patient was also noted to have a urinary tract infection in which she completed antibiotic course with ciprofloxacin.  MR brain without contrast with sequelae of treated lesion right temporal lobe with progressive volume loss, T1 shortening, no definite residual tumor remains, no evidence for residual or recurrent metastatic disease, progressive generalized volume loss and white matter disease, chronic mastoid effusions.  Patient's Keppra was increased to 750 mg every 12 hours, and remained on valproic acid 500 mg every 8 hours.  Patient was transferred to [facility] on 6/24 LTM EEG.  Assessment & Plan:   Atrial fibrillation with RVR versus SVT Hypotension Alerted by bedside RN this morning that patient's  heart rate sustaining in the 180s-190s and hypotensive.  Rapid response initiated.  At bedside, EKG what appears to be A-fib with RVR, no previous history; but on chronic anticoagulation with Eliquis outpatient, changed to Lovenox while inpatient given persistent delirium.  Cardiology and PCCM consulted.  Patient was given 1 L NS bolus, with some mild improvement of hypotension but MAP remained low.  Patient given amiodarone 150 mg IV x1 followed by amiodarone drip.  Given persistent hypotension and tachycardia, patient was transferred to the intensive care unit and starting on vasopressors.  Appreciate cardiology and pulmonary critical care medicine's assistance.  Husband updated at bedside.  Acute metabolic encephalopathy Hx seizure disorder Hypoactive delirium Initially thought to be related to UTI which was treated appropriately with ciprofloxacin in accordance with culture susceptibilities; but despite treatment no improvement in encephalopathy.  Patient with history of seizure disorder and there is concern for subclinical/nonepileptic seizures.  Also concern for vitamin deficiency with thiamine/niacin given her poor oral intake, also consideration of vasogenic edema given tapering of her steroids outpatient.  Initial EEG with findings suggestive of mild diffuse encephalopathy, nonspecific without seizure/epileptiform discharge.  Ammonia level on admission normal.  Cortisol level within normal limits.  CT head without contrast with changes consistent with postradiation but nothing acute.  MR brain with radiation changes to the right temporal lobe, no other acute findings.  Keppra was increased and was continued on home Depakote.  Continuous EEG read this morning with findings suggestive of moderate diffuse encephalopathy, nonspecific with no seizure or epileptiform discharges noted.  Neurology was consulted and followed during initial hospital course and now signed off as of 11/11/2021. --Valproic acid 500  mg IV q8h --Keppra reduced to 500 mg IV q12h on 6/22 --Decadron 4 mg IV q8h  Enterococcal UTI Completed course of antibiotics with ciprofloxacin in accordance with urine culture susceptibilities.  History of DVT/PE On Eliquis outpatient; which was transition to treatment dose Lovenox until able to tolerate oral intake  Acute on chronic diastolic congestive heart failure, not POA Acute hypoxic respiratory failure Etiology likely from volume overload from IV fluid hydration during initial hospitalization.  Chest x-ray 11/08/2021 with pulmonary edema.  TTE 09/2021 with normal LVEF, grade 1 diastolic dysfunction.  On transfer from [facility], was requiring up to 10 L high flow nasal cannula.  Patient was started on IV diuresis with furosemide with improvement of respiratory status; diuretic now discontinued given hypotension. --Strict I's and O's and daily weights  Dysphagia Likely related to mental status, appears to be improving today.  Seen by speech therapy with recommendations of clear liquid diet, aspiration precautions.  But continues with poor oral intake, now hospitalized for 9 days.  Discussed with patient's spouse, Melissa Case and agrees with initiating tube feeds until mentation and oral intake improves. Cortrack placed 6/23; and tube feeds initiated. -- SLP following  Depression Paroxetine 40 mg p.o. daily.  Chronic venous stasis dermatitis Evaluated by wound care, no evidence of open wounds.  Continue Unna boots  GERD: Protonix 40 mg IV q12h  Weakness/deconditioning/gait disturbance: Evaluated by PT/OT with recommendations of SNF placement.    DVT prophylaxis:   Lovenox apixaban (ELIQUIS) tablet 5 mg    Code Status: DNR Family Communication: No family present at bedside this morning; husband updated via telephone yesterday afternoon  Disposition Plan:  Level of care: ICU Status is: Inpatient Remains inpatient appropriate because: Continues with altered mental status, now  with A-fib with RVR versus SVT with hypotension, starting amiodarone drip, vasopressors and transferring to the intensive care unit.    Consultants:  Neurology Cardiology PCCM  Procedures:  EEG LTM EEG  Antimicrobials:  Ciprofloxacin 6/16 - 6/19 Ceftriaxone 6/13 - 6/15   Subjective: Called by bedside RN due to persistent tachycardia with hypotension with onset this morning.  Patient's husband at bedside.  Patient's heart rate sustaining in the 180s-190s with SBP as low as 60.  Patient's IV Lasix discontinued and given 1 L NS bolus.  PCCM and cardiology consulted.  Started on IV amiodarone.  Given persistent hypotension, transferred to the intensive care unit for further evaluation and treatment.  Objective: Vitals:   11/12/21 1130 11/12/21 1145 11/12/21 1200 11/12/21 1230  BP: 100/62 103/70 109/64 108/60  Pulse:  80 79 82  Resp: (!) 34 20 18 17   Temp:      TempSrc:      SpO2:  100% 100% 100%  Weight:      Height:        Intake/Output Summary (Last 24 hours) at 11/12/2021 1318 Last data filed at 11/12/2021 1200 Gross per 24 hour  Intake 2219.12 ml  Output --  Net 2219.12 ml   Filed Weights   11/10/21 0500 11/11/21 0500 11/12/21 0500  Weight: 104.8 kg 104.7 kg 99.9 kg    Examination:  Physical Exam: GEN: Poorly responsive confused, moaning, chronically ill in appearance, appears older than stated age HEENT: NCAT, PERRL, EOMI, sclera clear, MMM PULM: Breath sounds slight decreased bilateral bases, no wheezes/crackles, normal respiratory effort without accessory muscle use, 1L [place] SPO2 93% at rest CV: RRR w/o M/G/R GI: abd soft, nontender to deep palpation, NABS, ecchymosis noted to right abdomen/flank MSK: + Peripheral edema with Unna boots in place NEURO: Somnolent, poorly interactive, right facial droop noted PSYCH: normal mood/affect  Integumentary: Ecchymosis noted to right abdomen/flank area,    Data Reviewed: I have personally reviewed following labs and  imaging studies  CBC: Recent Labs  Lab 11/08/21 1524 11/09/21 0539 11/10/21 0250 11/11/21 0048 11/12/21 0630  WBC 7.6 6.1 4.6 4.7 11.5*  NEUTROABS  --  5.0 3.9  --   --   HGB 11.3* 11.3* 11.8* 11.0* 9.0*  HCT 36.9 35.4* 36.6 34.7* 28.6*  MCV 100.5* 98.1 98.4 100.0 100.0  PLT 109* 122* 175 181 287   Basic Metabolic Panel: Recent Labs  Lab 11/07/21 0926 11/09/21 0539 11/10/21 0250 11/11/21 0048 11/12/21 0626  NA 135 141 140 142 140  K 5.0 3.6 3.7 3.8 3.6  CL 100 101 99 98 101  CO2 26 34* 32 32 27  GLUCOSE 141* 112* 112* 118* 231*  BUN <5* <5* <5* 8 18  CREATININE 0.31* 0.36* 0.36* 0.39* 0.52  CALCIUM 7.7* 8.1* 8.1* 8.3* 8.0*  MG  --  1.9 1.9  --  2.2  PHOS  --   --   --   --  3.3   GFR: Estimated Creatinine Clearance: 85.9 mL/min (by C-G formula based on SCr of 0.52 mg/dL). Liver Function Tests: Recent Labs  Lab 11/09/21 0539 11/10/21 0250  AST 33 46*  ALT 16 19  ALKPHOS 82 85  BILITOT 0.3 0.6  PROT 4.9* 5.0*  ALBUMIN 2.1* 2.2*   No results for input(s): "LIPASE", "AMYLASE" in the last 168 hours. Recent Labs  Lab 11/10/21 0228  AMMONIA 28   Coagulation Profile: No results for input(s): "INR", "PROTIME" in the last 168 hours. Cardiac Enzymes: No results for input(s): "CKTOTAL", "CKMB", "CKMBINDEX", "TROPONINI" in the last 168 hours. BNP (last 3 results) Recent Labs    08/12/21 1009  PROBNP 28.0   HbA1C: No results for input(s): "HGBA1C" in the last 72 hours. CBG: Recent Labs  Lab 11/11/21 2001 11/12/21 0007 11/12/21 0336 11/12/21 0753 11/12/21 1209  GLUCAP 179* 207* 252* 218* 158*   Lipid Profile: No results for input(s): "CHOL", "HDL", "LDLCALC", "TRIG", "CHOLHDL", "LDLDIRECT" in the last 72 hours. Thyroid Function Tests: No results for input(s): "TSH", "T4TOTAL", "FREET4", "T3FREE", "THYROIDAB" in the last 72 hours. Anemia Panel: No results for input(s): "VITAMINB12", "FOLATE", "FERRITIN", "TIBC", "IRON", "RETICCTPCT" in the last 72  hours. Sepsis Labs: Recent Labs  Lab 11/09/21 0539 11/10/21 0250  PROCALCITON <0.10 <0.10    Recent Results (from the past 240 hour(s))  MRSA Next Gen by PCR, Nasal     Status: None   Collection Time: 11/08/21  6:53 PM   Specimen: Nasal Mucosa; Nasal Swab  Result Value Ref Range Status   MRSA by PCR Next Gen NOT DETECTED NOT DETECTED Final    Comment: (NOTE) The GeneXpert MRSA Assay (FDA approved for NASAL specimens only), is one component of a comprehensive MRSA colonization surveillance program. It is not intended to diagnose MRSA infection nor to guide or monitor treatment for MRSA infections. Test performance is not FDA approved in patients less than 58 years old. Performed at [facility], [address], [place], [place] [address]          Radiology Studies: DG CHEST PORT 1 VIEW  Result Date: 11/12/2021 CLINICAL DATA:  [id number]; aspiration pneumonia EXAM: PORTABLE CHEST 1 VIEW COMPARISON:  Chest radiograph dated November 09, 2021 FINDINGS: Evaluation is limited secondary to rotation. The cardiomediastinal silhouette is unchanged in contour. Unchanged blunting of the RIGHT costophrenic angle. No pneumothorax. Mildly improved aeration of the lungs  with decreased conspicuity of patchy peripheral predominant opacities. Persistent diffuse interstitial prominence with peribronchial cuffing which may reflect a degree of underlying pulmonary edema. There is a persistent opacification of the RIGHT lower peripheral lung. The enteric tube courses through the chest to the abdomen beyond the field-of-view. IMPRESSION: 1. Mildly improved aeration of bilateral lungs in comparison to prior with persistent heterogeneous opacities of the RIGHT lower peripheral lung and persistent background of possible underlying pulmonary edema. Electronically Signed   By: Meda Klinefelter M.D.   On: 11/12/2021 11:17   DG Abd Portable 1V  Result Date: 11/11/2021 CLINICAL DATA:  Feeding tube placement.  EXAM: PORTABLE ABDOMEN - 1 VIEW COMPARISON:  Oct 16, 2021. FINDINGS: The bowel gas pattern is normal. Distal tip of feeding tube is seen in expected position of distal stomach. No radio-opaque calculi or other significant radiographic abnormality are seen. IMPRESSION: Distal tip of feeding tube seen in expected position of distal stomach. Electronically Signed   By: Lupita Raider M.D.   On: 11/11/2021 11:38        Scheduled Meds:  apixaban  5 mg Per Tube BID   Chlorhexidine Gluconate Cloth  6 each Topical Daily   dexamethasone (DECADRON) injection  4 mg Intravenous Q8H   feeding supplement (PROSource TF)  45 mL Per Tube Daily   free water  100 mL Per Tube Q4H   insulin aspart  0-20 Units Subcutaneous Q4H   lactose free nutrition  237 mL Oral TID WC   multivitamin with minerals  1 tablet Per Tube Daily   mouth rinse  15 mL Mouth Rinse 4 times per day   pantoprazole (PROTONIX) IV  40 mg Intravenous Q12H   [START ON 11/13/2021] PARoxetine  40 mg Per Tube Daily   sodium chloride flush  10-40 mL Intracatheter Q12H   thiamine  100 mg Per Tube Daily   Continuous Infusions:  sodium chloride     amiodarone 60 mg/hr (11/12/21 1200)   Followed by   amiodarone     feeding supplement (OSMOLITE 1.5 CAL) 55 mL/hr at 11/12/21 1200   lactated ringers     levETIRAcetam 400 mL/hr at 11/12/21 1200   phenylephrine (NEO-SYNEPHRINE) Adult infusion 25 mcg/min (11/12/21 1200)   potassium chloride 10 mEq (11/12/21 1300)   valproate sodium Stopped (11/12/21 0727)     LOS: 11 days    Critical Care Time Upon my evaluation, this patient had a high probability of imminent or life-threatening deterioration due to atrial fibrillation with RVR versus SVT with hypotension, which required my direct attention, intervention, and personal management.  I have personally provided 42 minutes of critical care time exclusive of my time spent on separately billable procedures.  Time includes review of laboratory data,  radiology results, discussion with consultants, and monitoring for potential decompensation.       Alvira Philips [place], DO [facility] Available via Epic secure chat 7am-7pm After these hours, please refer to coverage provider listed on [email or web address] 11/12/2021, 1:18 PM

## 2021-11-12 NOTE — Progress Notes (Signed)
Pt started the morning with soft Bps, notified physician, bolus started. Telemetry phoned this RN stating pts HR was in the 190's, this RN witnessed likely afib running up to the 200s. Rapid response and physician notified. Bolus increased and metoprolol given. BP and HR began to improve slowly.

## 2021-11-12 NOTE — Progress Notes (Signed)
Patient received from 3W.  CCM aware of transfer.  Husband by bedside

## 2021-11-12 NOTE — Progress Notes (Signed)
Consulted for vasopressor ultrasound guided PIV; no suitable vein found using ultrasound,RN aware.

## 2021-11-12 NOTE — Progress Notes (Signed)
Request for USG PIV for pressors received.  Secure chat sent to Dr [place] and 4N T. Johny Drilling RN re hx of difficult IV stick for IVTeam.  With  Amiodarone, Neo, Keppra, Potassium and depakote ordered via IV, PICC recommended.  Deferred by Dr Merrily Pew per Leata Mouse RN.  RN also notified of medications that are not recommended via the midline.  Pt DNR at this time, discussing goals of care per RN.

## 2021-11-13 ENCOUNTER — Inpatient Hospital Stay (HOSPITAL_COMMUNITY): Payer: BC Managed Care – PPO

## 2021-11-13 DIAGNOSIS — I471 Supraventricular tachycardia: Secondary | ICD-10-CM | POA: Diagnosis not present

## 2021-11-13 DIAGNOSIS — N3000 Acute cystitis without hematuria: Secondary | ICD-10-CM | POA: Diagnosis not present

## 2021-11-13 DIAGNOSIS — G9341 Metabolic encephalopathy: Secondary | ICD-10-CM | POA: Diagnosis not present

## 2021-11-13 DIAGNOSIS — D62 Acute posthemorrhagic anemia: Secondary | ICD-10-CM

## 2021-11-13 DIAGNOSIS — I959 Hypotension, unspecified: Secondary | ICD-10-CM | POA: Diagnosis not present

## 2021-11-13 LAB — CBC
HCT: 16.8 % — ABNORMAL LOW (ref 36.0–46.0)
Hemoglobin: 5.3 g/dL — CL (ref 12.0–15.0)
MCH: 32.5 pg (ref 26.0–34.0)
MCHC: 31.5 g/dL (ref 30.0–36.0)
MCV: 103.1 fL — ABNORMAL HIGH (ref 80.0–100.0)
Platelets: 302 10*3/uL (ref 150–400)
RBC: 1.63 MIL/uL — ABNORMAL LOW (ref 3.87–5.11)
RDW: 17.9 % — ABNORMAL HIGH (ref 11.5–15.5)
WBC: 17.3 10*3/uL — ABNORMAL HIGH (ref 4.0–10.5)
nRBC: 2.2 % — ABNORMAL HIGH (ref 0.0–0.2)

## 2021-11-13 LAB — BASIC METABOLIC PANEL
Anion gap: 7 (ref 5–15)
BUN: 17 mg/dL (ref 6–20)
CO2: 25 mmol/L (ref 22–32)
Calcium: 7.5 mg/dL — ABNORMAL LOW (ref 8.9–10.3)
Chloride: 106 mmol/L (ref 98–111)
Creatinine, Ser: 0.58 mg/dL (ref 0.44–1.00)
GFR, Estimated: >60 mL/min (ref >60)
Glucose, Bld: 263 mg/dL — ABNORMAL HIGH (ref 70–99)
Potassium: 3.9 mmol/L (ref 3.5–5.1)
Sodium: 138 mmol/L (ref 135–145)

## 2021-11-13 LAB — PHOSPHORUS: Phosphorus: 2.4 mg/dL — ABNORMAL LOW (ref 2.5–4.6)

## 2021-11-13 LAB — GLUCOSE, CAPILLARY
Glucose-Capillary: 284 mg/dL — ABNORMAL HIGH (ref 70–99)
Glucose-Capillary: 257 mg/dL — ABNORMAL HIGH (ref 70–99)
Glucose-Capillary: 239 mg/dL — ABNORMAL HIGH (ref 70–99)
Glucose-Capillary: 245 mg/dL — ABNORMAL HIGH (ref 70–99)
Glucose-Capillary: 211 mg/dL — ABNORMAL HIGH (ref 70–99)
Glucose-Capillary: 186 mg/dL — ABNORMAL HIGH (ref 70–99)

## 2021-11-13 LAB — PREPARE RBC (CROSSMATCH)

## 2021-11-13 LAB — MAGNESIUM: Magnesium: 2.1 mg/dL (ref 1.7–2.4)

## 2021-11-13 LAB — HEMOGLOBIN AND HEMATOCRIT, BLOOD
HCT: 24.6 % — ABNORMAL LOW (ref 36.0–46.0)
Hemoglobin: 8.4 g/dL — ABNORMAL LOW (ref 12.0–15.0)

## 2021-11-13 MED ORDER — ACETAMINOPHEN 160 MG/5ML PO SOLN
650.0000 mg | Freq: Four times a day (QID) | ORAL | Status: DC | PRN
  Administered 2021-11-13 (×2): 650 mg
  Filled 2021-11-13 (×2): qty 20.3

## 2021-11-13 MED ORDER — SODIUM CHLORIDE 0.9 % IV BOLUS
500.0000 mL | Freq: Once | INTRAVENOUS | Status: AC
  Administered 2021-11-14: 500 mL via INTRAVENOUS

## 2021-11-13 MED ORDER — IOHEXOL 300 MG/ML  SOLN
100.0000 mL | Freq: Once | INTRAMUSCULAR | Status: AC | PRN
  Administered 2021-11-13: 100 mL via INTRAVENOUS

## 2021-11-13 MED ORDER — OSMOLITE 1.5 CAL PO LIQD
1000.0000 mL | ORAL | Status: DC
Start: 2021-11-13 — End: 2021-11-14

## 2021-11-13 MED ORDER — ACETAMINOPHEN 650 MG RE SUPP: 650.0000 mg | Freq: Four times a day (QID) | RECTAL | Status: DC | PRN

## 2021-11-13 MED ORDER — PANTOPRAZOLE SODIUM 40 MG IV SOLR
40.0000 mg | INTRAVENOUS | Status: DC
  Administered 2021-11-13: 40 mg via INTRAVENOUS
  Filled 2021-11-13: qty 10

## 2021-11-13 MED ORDER — SODIUM CHLORIDE 0.9 % IV BOLUS
500.0000 mL | Freq: Once | INTRAVENOUS | Status: AC
  Administered 2021-11-13: 500 mL via INTRAVENOUS

## 2021-11-13 MED ORDER — SODIUM CHLORIDE 0.9% IV SOLUTION: Freq: Once | INTRAVENOUS | Status: DC

## 2021-11-13 MED ORDER — INSULIN GLARGINE-YFGN 100 UNIT/ML ~~LOC~~ SOLN
10.0000 [IU] | Freq: Two times a day (BID) | SUBCUTANEOUS | Status: DC
  Administered 2021-11-13 (×2): 10 [IU] via SUBCUTANEOUS
  Filled 2021-11-13 (×4): qty 0.1

## 2021-11-14 LAB — BPAM RBC
Blood Product Expiration Date: 202307132359
Blood Product Expiration Date: 202307132359
ISSUE DATE / TIME: 202306251123
ISSUE DATE / TIME: 202306251331
Unit Type and Rh: 600
Unit Type and Rh: 600

## 2021-11-14 LAB — TYPE AND SCREEN
ABO/RH(D): A NEG
Antibody Screen: NEGATIVE
Unit division: 0
Unit division: 0

## 2021-11-14 LAB — CBC
HCT: 23.7 % — ABNORMAL LOW (ref 36.0–46.0)
HCT: 26.8 % — ABNORMAL LOW (ref 36.0–46.0)
Hemoglobin: 7.7 g/dL — ABNORMAL LOW (ref 12.0–15.0)
Hemoglobin: 8.2 g/dL — ABNORMAL LOW (ref 12.0–15.0)
MCH: 30.2 pg (ref 26.0–34.0)
MCH: 30.1 pg (ref 26.0–34.0)
MCHC: 32.5 g/dL (ref 30.0–36.0)
MCHC: 30.6 g/dL (ref 30.0–36.0)
MCV: 92.9 fL (ref 80.0–100.0)
MCV: 98.5 fL (ref 80.0–100.0)
Platelets: 265 10*3/uL (ref 150–400)
Platelets: 288 10*3/uL (ref 150–400)
RBC: 2.55 MIL/uL — ABNORMAL LOW (ref 3.87–5.11)
RBC: 2.72 MIL/uL — ABNORMAL LOW (ref 3.87–5.11)
RDW: 18.7 % — ABNORMAL HIGH (ref 11.5–15.5)
RDW: 20.2 % — ABNORMAL HIGH (ref 11.5–15.5)
WBC: 29.6 10*3/uL — ABNORMAL HIGH (ref 4.0–10.5)
WBC: 41.3 10*3/uL — ABNORMAL HIGH (ref 4.0–10.5)
nRBC: 8.5 % — ABNORMAL HIGH (ref 0.0–0.2)
nRBC: 10.8 % — ABNORMAL HIGH (ref 0.0–0.2)

## 2021-11-14 LAB — MAGNESIUM: Magnesium: 2.4 mg/dL (ref 1.7–2.4)

## 2021-11-14 LAB — BASIC METABOLIC PANEL
Anion gap: 20 — ABNORMAL HIGH (ref 5–15)
BUN: 28 mg/dL — ABNORMAL HIGH (ref 6–20)
CO2: 15 mmol/L — ABNORMAL LOW (ref 22–32)
Calcium: 7.7 mg/dL — ABNORMAL LOW (ref 8.9–10.3)
Chloride: 106 mmol/L (ref 98–111)
Creatinine, Ser: 1.2 mg/dL — ABNORMAL HIGH (ref 0.44–1.00)
GFR, Estimated: 52 mL/min — ABNORMAL LOW (ref >60)
Glucose, Bld: 163 mg/dL — ABNORMAL HIGH (ref 70–99)
Potassium: 5 mmol/L (ref 3.5–5.1)
Sodium: 141 mmol/L (ref 135–145)

## 2021-11-14 LAB — PHOSPHORUS: Phosphorus: 5.3 mg/dL — ABNORMAL HIGH (ref 2.5–4.6)

## 2021-11-14 LAB — GLUCOSE, CAPILLARY: Glucose-Capillary: 152 mg/dL — ABNORMAL HIGH (ref 70–99)

## 2021-11-19 NOTE — Progress Notes (Signed)
TOD 0740 called by NP Joneen Roach at bedside.

## 2021-11-19 DEATH — deceased

## 2021-11-25 ENCOUNTER — Other Ambulatory Visit (HOSPITAL_COMMUNITY): Payer: BC Managed Care – PPO

## 2021-11-25 ENCOUNTER — Encounter (HOSPITAL_COMMUNITY): Payer: Self-pay

## 2021-12-02 ENCOUNTER — Other Ambulatory Visit (HOSPITAL_COMMUNITY): Payer: BC Managed Care – PPO

## 2021-12-09 ENCOUNTER — Ambulatory Visit: Payer: BC Managed Care – PPO | Admitting: Student

## 2021-12-30 ENCOUNTER — Telehealth (HOSPITAL_COMMUNITY): Payer: BC Managed Care – PPO | Admitting: Psychiatry

## 2022-02-27 ENCOUNTER — Ambulatory Visit: Payer: BC Managed Care – PPO | Admitting: Neurology

## 2022-06-30 IMAGING — CT CT L SPINE W/O CM
3 of 5 series · 10 of 33 positions shown, 12 images · non-contrast
Comparison: CT Abdomen and Pelvis 08/17/2021, 07/12/2021.

CLINICAL DATA: 60-year-old female status post fall 2 weeks ago with
continued back pain.



[Series 4: axial reformats bone · axial · 0.26mm/px · z∈[-656,-423]mm · 5 of 180 slices shown, 7 images]
[im 30/180  soft-tissue]
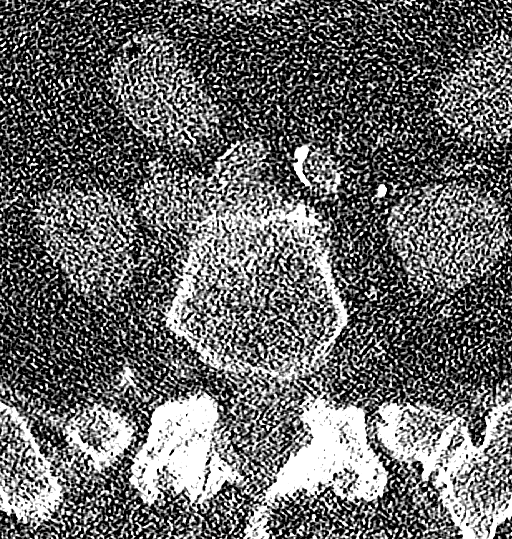
[im 30/180  bone]
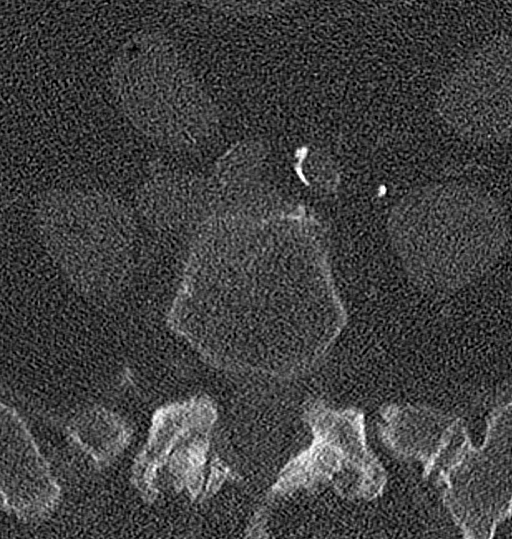
[im 60/180  bone]
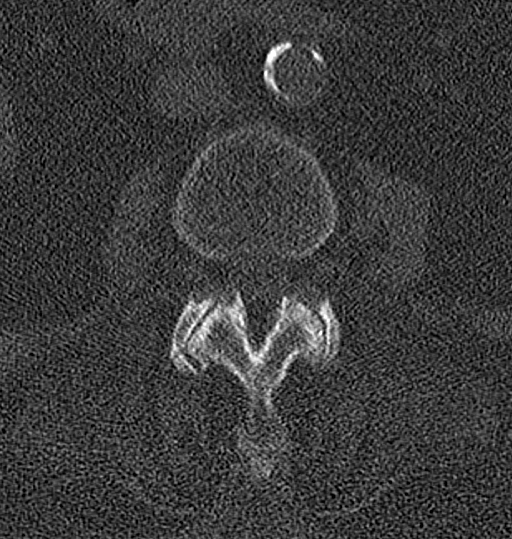
[im 90/180  bone]
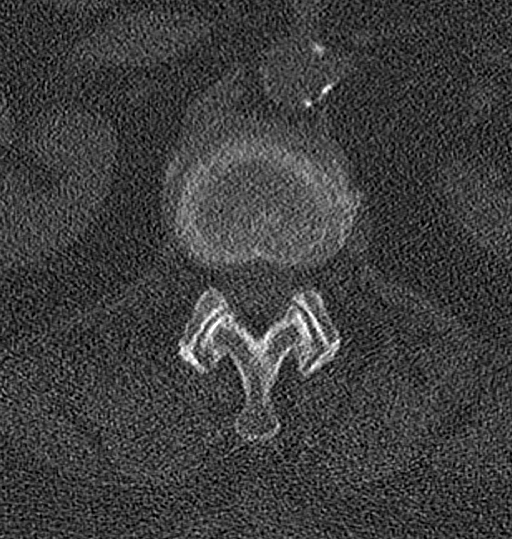
[im 120/180  bone]
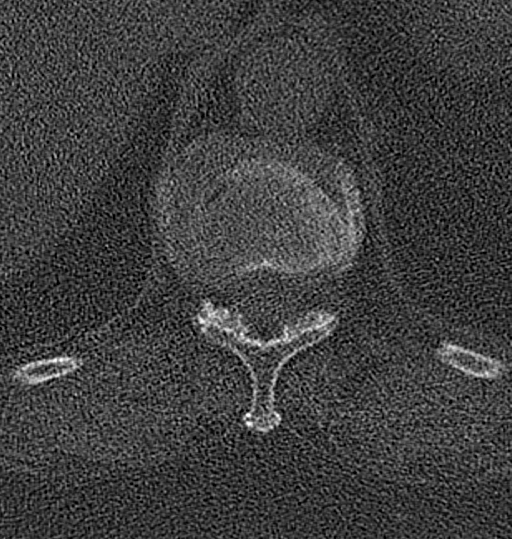
[im 150/180  soft-tissue]
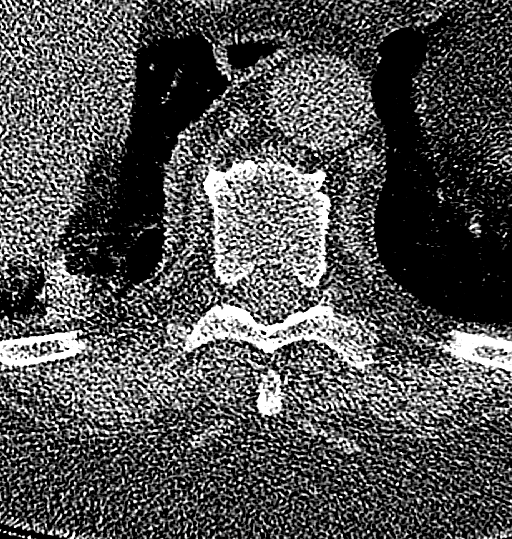
[im 150/180  bone]
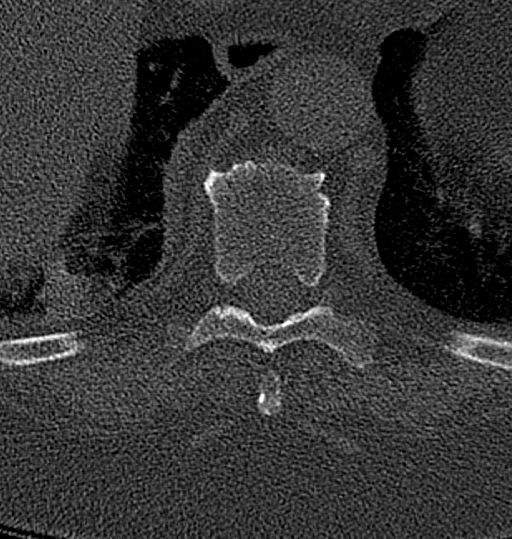

[Series 5: coronal bone · coronal · 0.24mm/px · 3 of 86 slices shown]
[im 18/86  bone]
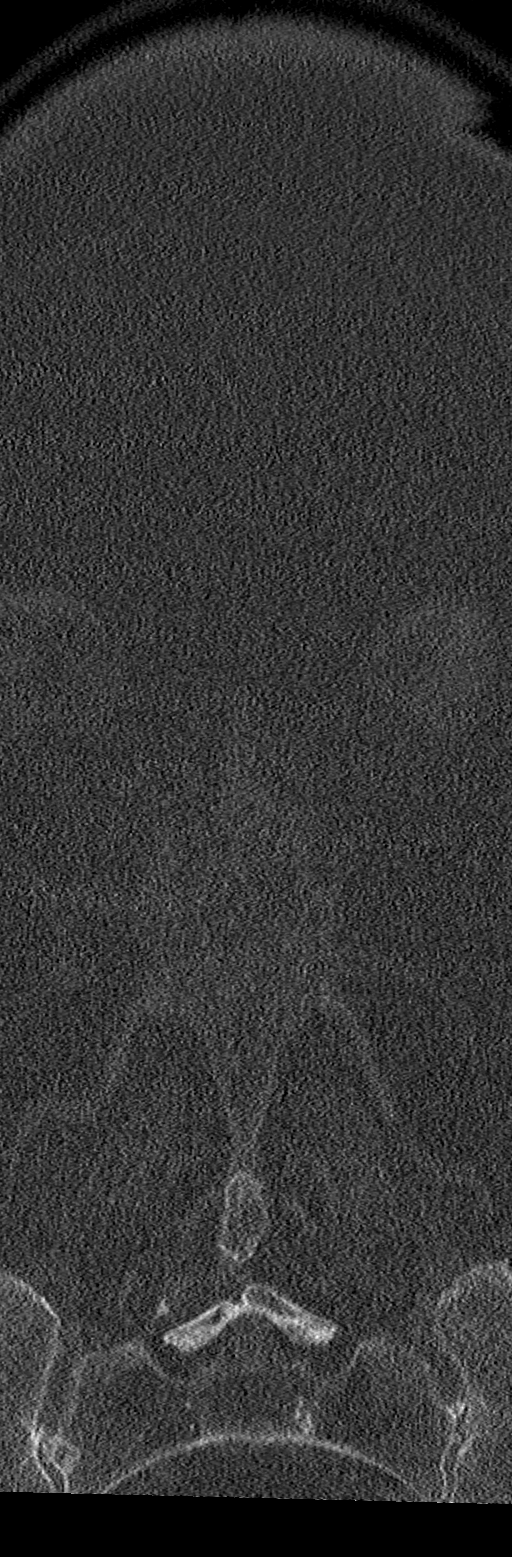
[im 35/86  bone]
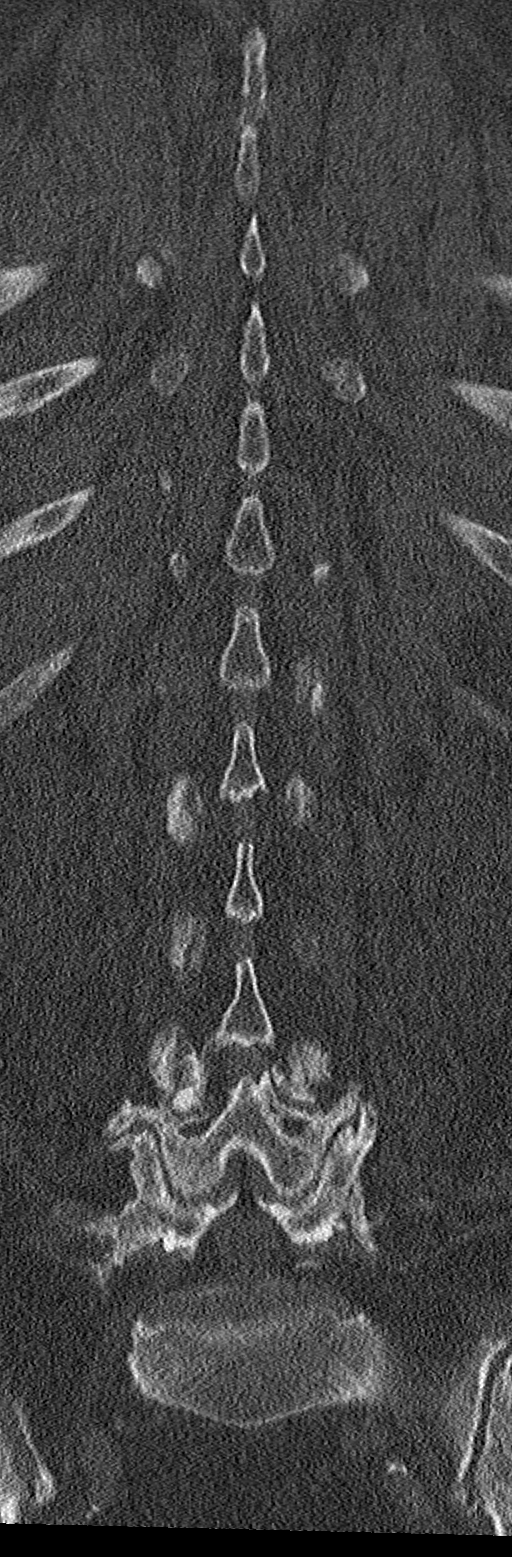
[im 52/86  bone]
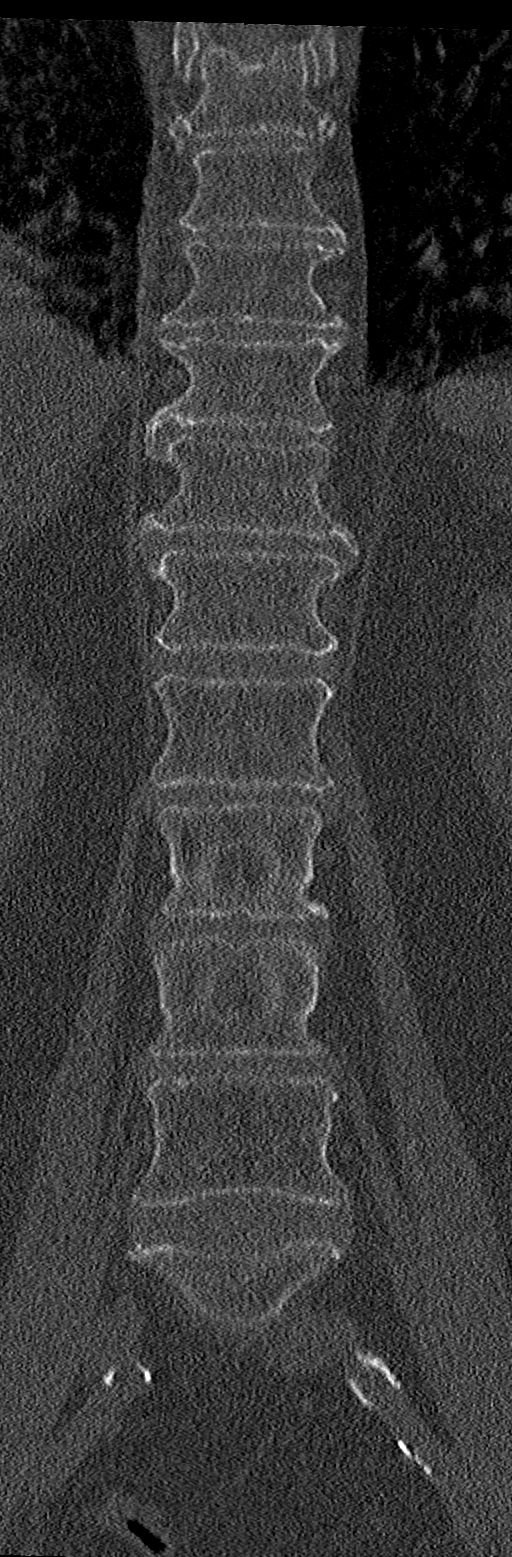

[Series 8: l spine st · axial · 0.35mm/px · z∈[-674,-614]mm · 2 of 181 slices shown]
[im 31/181  bone]
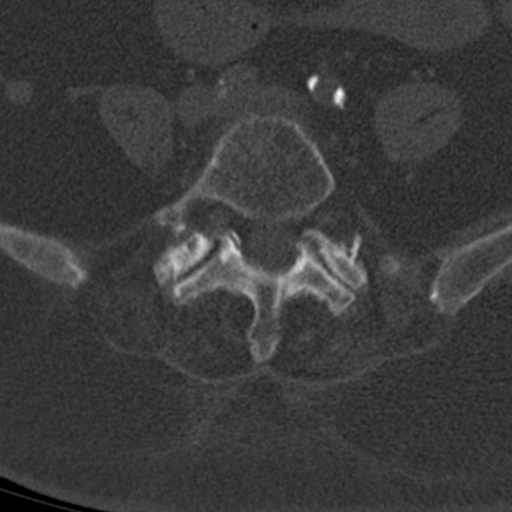
[im 61/181  bone]
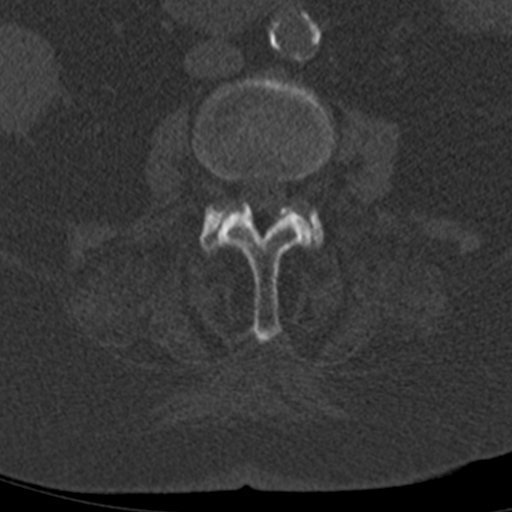

[10 of 33 positions shown; findings below may reference images not displayed]

FINDINGS: Segmentation: Normal.

Alignment: Stable lumbar lordosis since [REDACTED]. Subtle grade 1
anterolisthesis of L4 on L5.

Vertebrae: Diffuse osteopenia. Visible posterior ribs appear intact.
No convincing visible lower thoracic vertebral compression fracture.

Subtle superior endplate compression of L5 centrally when compared
to the recent CTs. This might be an acute Schmorl's node or
impending L5 compression fracture.

Elsewhere stable lumbar vertebral height and alignment since
[REDACTED]. Intact visible sacrum and SI joints.

Paraspinal and other soft tissues: Stable cavitary lesion in the
right costophrenic angle. Stable visible abdominal viscera from the
CT Abdomen and Pelvis 3 days ago. Aortoiliac calcified
atherosclerosis. Negative lumbar paraspinal soft tissues.

Disc levels: No CT evidence of lower thoracic or upper lumbar spinal
stenosis.

Dominant degenerative changes heart L4-L5 where grade 1
anterolisthesis is associated with severe bilateral facet
hypertrophy and circumferential disc/pseudo disc. Combined with
epidural lipomatosis there is moderate to severe spinal stenosis
(series 8, image 140). Mild if any L4 foraminal stenosis.

Severe facet arthropathy at L5-S1 with vacuum facet, but mild if any
associated foraminal stenosis.
IMPRESSION: 1. Severe osteopenia. Suspicious for subtle acute L5 superior
endplate compression fracture. Lumbar MRI or Nuclear Medicine
Whole-body Bone Scan would evaluate with the highest sensitivity and
specificity.
2. No other acute osseous abnormality identified.
3. Grade 1 anterolisthesis at L4-L5 with moderate to severe
multifactorial spinal stenosis in part due to severe facet
arthropathy. Severe facet degeneration also at L5-S1.
4. Stable visible lower chest and abdomen from the recent CT Abdomen
and Pelvis 08/17/2021.

## 2024-02-18 NOTE — Telephone Encounter (Signed)
 error
